# Patient Record
Sex: Female | Born: 1960
Health system: Southern US, Community
[De-identification: ages and names within clinical notes are randomized; demographics above are authoritative.]

## PROBLEM LIST (undated history)

## (undated) DIAGNOSIS — IMO0001 Reserved for inherently not codable concepts without codable children: Secondary | ICD-10-CM

## (undated) DIAGNOSIS — I509 Heart failure, unspecified: Secondary | ICD-10-CM

## (undated) DIAGNOSIS — C801 Malignant (primary) neoplasm, unspecified: Secondary | ICD-10-CM

## (undated) DIAGNOSIS — Z9221 Personal history of antineoplastic chemotherapy: Secondary | ICD-10-CM

## (undated) DIAGNOSIS — R002 Palpitations: Secondary | ICD-10-CM

## (undated) DIAGNOSIS — A630 Anogenital (venereal) warts: Secondary | ICD-10-CM

## (undated) DIAGNOSIS — R112 Nausea with vomiting, unspecified: Secondary | ICD-10-CM

## (undated) DIAGNOSIS — R0600 Dyspnea, unspecified: Secondary | ICD-10-CM

## (undated) DIAGNOSIS — C50919 Malignant neoplasm of unspecified site of unspecified female breast: Secondary | ICD-10-CM

## (undated) DIAGNOSIS — Z923 Personal history of irradiation: Secondary | ICD-10-CM

## (undated) DIAGNOSIS — C210 Malignant neoplasm of anus, unspecified: Secondary | ICD-10-CM

## (undated) DIAGNOSIS — R911 Solitary pulmonary nodule: Secondary | ICD-10-CM

## (undated) DIAGNOSIS — F419 Anxiety disorder, unspecified: Secondary | ICD-10-CM

## (undated) DIAGNOSIS — M1990 Unspecified osteoarthritis, unspecified site: Secondary | ICD-10-CM

## (undated) DIAGNOSIS — J189 Pneumonia, unspecified organism: Secondary | ICD-10-CM

## (undated) DIAGNOSIS — J449 Chronic obstructive pulmonary disease, unspecified: Secondary | ICD-10-CM

## (undated) DIAGNOSIS — B029 Zoster without complications: Secondary | ICD-10-CM

## (undated) DIAGNOSIS — Z8614 Personal history of Methicillin resistant Staphylococcus aureus infection: Secondary | ICD-10-CM

## (undated) DIAGNOSIS — Z9889 Other specified postprocedural states: Secondary | ICD-10-CM

## (undated) DIAGNOSIS — Z8619 Personal history of other infectious and parasitic diseases: Secondary | ICD-10-CM

## (undated) DIAGNOSIS — D649 Anemia, unspecified: Secondary | ICD-10-CM

## (undated) DIAGNOSIS — C3412 Malignant neoplasm of upper lobe, left bronchus or lung: Secondary | ICD-10-CM

## (undated) DIAGNOSIS — IMO0002 Reserved for concepts with insufficient information to code with codable children: Secondary | ICD-10-CM

## (undated) HISTORY — DX: Malignant neoplasm of unspecified site of unspecified female breast: C50.919

## (undated) HISTORY — DX: Personal history of irradiation: Z92.3

## (undated) HISTORY — DX: Personal history of antineoplastic chemotherapy: Z92.21

## (undated) HISTORY — PX: LUNG LOBECTOMY: SHX167

## (undated) HISTORY — DX: Malignant (primary) neoplasm, unspecified: C80.1

## (undated) HISTORY — DX: Zoster without complications: B02.9

## (undated) HISTORY — DX: Personal history of other infectious and parasitic diseases: Z86.19

## (undated) HISTORY — DX: Anogenital (venereal) warts: A63.0

## (undated) HISTORY — DX: Reserved for inherently not codable concepts without codable children: IMO0001

## (undated) HISTORY — PX: COLONOSCOPY W/ POLYPECTOMY: SHX1380

## (undated) HISTORY — DX: Personal history of Methicillin resistant Staphylococcus aureus infection: Z86.14

## (undated) HISTORY — DX: Malignant neoplasm of anus, unspecified: C21.0

## (undated) HISTORY — DX: Reserved for concepts with insufficient information to code with codable children: IMO0002

## (undated) HISTORY — PX: KNEE ARTHROSCOPY: SUR90

## (undated) HISTORY — DX: Malignant neoplasm of upper lobe, left bronchus or lung: C34.12

---
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
## 2003-10-27 ENCOUNTER — Encounter: Admission: RE | Admit: 2003-10-27 | Discharge: 2003-10-27 | Payer: Self-pay | Admitting: Family Medicine

## 2004-03-15 ENCOUNTER — Other Ambulatory Visit: Admission: RE | Admit: 2004-03-15 | Discharge: 2004-03-15 | Payer: Self-pay | Admitting: Family Medicine

## 2004-08-24 ENCOUNTER — Other Ambulatory Visit: Admission: RE | Admit: 2004-08-24 | Discharge: 2004-08-24 | Payer: Self-pay | Admitting: Family Medicine

## 2005-03-07 ENCOUNTER — Other Ambulatory Visit: Admission: RE | Admit: 2005-03-07 | Discharge: 2005-03-07 | Payer: Self-pay | Admitting: Family Medicine

## 2005-04-06 ENCOUNTER — Encounter: Admission: RE | Admit: 2005-04-06 | Discharge: 2005-04-06 | Payer: Self-pay | Admitting: Family Medicine

## 2006-04-20 ENCOUNTER — Ambulatory Visit (HOSPITAL_COMMUNITY): Admission: RE | Admit: 2006-04-20 | Discharge: 2006-04-20 | Payer: Self-pay | Admitting: Family Medicine

## 2006-04-23 ENCOUNTER — Encounter: Admission: RE | Admit: 2006-04-23 | Discharge: 2006-04-23 | Payer: Self-pay | Admitting: Family Medicine

## 2006-05-26 ENCOUNTER — Emergency Department (HOSPITAL_COMMUNITY): Admission: EM | Admit: 2006-05-26 | Discharge: 2006-05-26 | Payer: Self-pay | Admitting: Emergency Medicine

## 2007-03-20 ENCOUNTER — Other Ambulatory Visit: Admission: RE | Admit: 2007-03-20 | Discharge: 2007-03-20 | Payer: Self-pay | Admitting: Obstetrics & Gynecology

## 2007-04-26 ENCOUNTER — Ambulatory Visit (HOSPITAL_COMMUNITY): Admission: RE | Admit: 2007-04-26 | Discharge: 2007-04-26 | Payer: Self-pay | Admitting: Obstetrics & Gynecology

## 2007-05-09 DIAGNOSIS — C50919 Malignant neoplasm of unspecified site of unspecified female breast: Secondary | ICD-10-CM

## 2007-05-09 HISTORY — DX: Malignant neoplasm of unspecified site of unspecified female breast: C50.919

## 2007-06-04 DIAGNOSIS — C50911 Malignant neoplasm of unspecified site of right female breast: Secondary | ICD-10-CM | POA: Insufficient documentation

## 2008-02-04 ENCOUNTER — Encounter: Admission: RE | Admit: 2008-02-04 | Discharge: 2008-02-04 | Payer: Self-pay | Admitting: Obstetrics & Gynecology

## 2008-02-04 ENCOUNTER — Encounter (INDEPENDENT_AMBULATORY_CARE_PROVIDER_SITE_OTHER): Payer: Self-pay | Admitting: Diagnostic Radiology

## 2008-02-04 HISTORY — PX: BREAST BIOPSY: SHX20

## 2008-02-05 ENCOUNTER — Encounter: Admission: RE | Admit: 2008-02-05 | Discharge: 2008-02-05 | Payer: Self-pay | Admitting: Obstetrics & Gynecology

## 2008-02-06 DIAGNOSIS — C801 Malignant (primary) neoplasm, unspecified: Secondary | ICD-10-CM

## 2008-02-06 DIAGNOSIS — Z9221 Personal history of antineoplastic chemotherapy: Secondary | ICD-10-CM

## 2008-02-06 HISTORY — DX: Malignant (primary) neoplasm, unspecified: C80.1

## 2008-02-06 HISTORY — DX: Personal history of antineoplastic chemotherapy: Z92.21

## 2008-02-11 ENCOUNTER — Encounter: Admission: RE | Admit: 2008-02-11 | Discharge: 2008-02-11 | Payer: Self-pay | Admitting: Obstetrics & Gynecology

## 2008-02-12 ENCOUNTER — Encounter: Admission: RE | Admit: 2008-02-12 | Discharge: 2008-02-12 | Payer: Self-pay | Admitting: Obstetrics & Gynecology

## 2008-02-13 ENCOUNTER — Ambulatory Visit: Payer: Self-pay | Admitting: Oncology

## 2008-02-19 LAB — CBC WITH DIFFERENTIAL/PLATELET
BASO%: 0.7 % (ref 0.0–2.0)
Basophils Absolute: 0 10*3/uL (ref 0.0–0.1)
EOS%: 4.7 % (ref 0.0–7.0)
Eosinophils Absolute: 0.3 10*3/uL (ref 0.0–0.5)
HCT: 42.8 % (ref 34.8–46.6)
HGB: 14.9 g/dL (ref 11.6–15.9)
LYMPH%: 28.5 % (ref 14.0–48.0)
MCH: 33.4 pg (ref 26.0–34.0)
MCHC: 34.8 g/dL (ref 32.0–36.0)
MCV: 95.8 fL (ref 81.0–101.0)
MONO#: 0.4 10*3/uL (ref 0.1–0.9)
MONO%: 6.5 % (ref 0.0–13.0)
NEUT#: 3.6 10*3/uL (ref 1.5–6.5)
NEUT%: 59.6 % (ref 39.6–76.8)
Platelets: 286 10*3/uL (ref 145–400)
RBC: 4.47 10*6/uL (ref 3.70–5.32)
RDW: 12.5 % (ref 11.3–14.5)
WBC: 6.1 10*3/uL (ref 3.9–10.0)
lymph#: 1.7 10*3/uL (ref 0.9–3.3)

## 2008-02-19 LAB — COMPREHENSIVE METABOLIC PANEL
ALT: 15 U/L (ref 0–35)
AST: 15 U/L (ref 0–37)
Albumin: 4.7 g/dL (ref 3.5–5.2)
Alkaline Phosphatase: 54 U/L (ref 39–117)
BUN: 11 mg/dL (ref 6–23)
CO2: 26 mEq/L (ref 19–32)
Calcium: 10 mg/dL (ref 8.4–10.5)
Chloride: 103 mEq/L (ref 96–112)
Creatinine, Ser: 0.76 mg/dL (ref 0.40–1.20)
Glucose, Bld: 103 mg/dL — ABNORMAL HIGH (ref 70–99)
Potassium: 4.3 mEq/L (ref 3.5–5.3)
Sodium: 139 mEq/L (ref 135–145)
Total Bilirubin: 0.7 mg/dL (ref 0.3–1.2)
Total Protein: 7.4 g/dL (ref 6.0–8.3)

## 2008-02-19 LAB — CANCER ANTIGEN 27.29: CA 27.29: 14 U/mL (ref 0–39)

## 2008-02-19 LAB — VITAMIN D 25 HYDROXY (VIT D DEFICIENCY, FRACTURES): Vit D, 25-Hydroxy: 34 ng/mL (ref 30–89)

## 2008-02-19 LAB — LACTATE DEHYDROGENASE: LDH: 140 U/L (ref 94–250)

## 2008-02-21 ENCOUNTER — Ambulatory Visit (HOSPITAL_COMMUNITY): Admission: RE | Admit: 2008-02-21 | Discharge: 2008-02-21 | Payer: Self-pay | Admitting: General Surgery

## 2008-02-24 ENCOUNTER — Ambulatory Visit (HOSPITAL_COMMUNITY): Admission: RE | Admit: 2008-02-24 | Discharge: 2008-02-24 | Payer: Self-pay | Admitting: Oncology

## 2008-02-25 ENCOUNTER — Ambulatory Visit: Payer: Self-pay

## 2008-02-25 ENCOUNTER — Encounter: Payer: Self-pay | Admitting: Oncology

## 2008-03-08 HISTORY — PX: PORTACATH PLACEMENT: SHX2246

## 2008-03-11 LAB — CBC WITH DIFFERENTIAL/PLATELET
BASO%: 0.5 % (ref 0.0–2.0)
Basophils Absolute: 0 10*3/uL (ref 0.0–0.1)
EOS%: 4.5 % (ref 0.0–7.0)
Eosinophils Absolute: 0.3 10*3/uL (ref 0.0–0.5)
HCT: 37.8 % (ref 34.8–46.6)
HGB: 13.3 g/dL (ref 11.6–15.9)
LYMPH%: 25.1 % (ref 14.0–48.0)
MCH: 33.8 pg (ref 26.0–34.0)
MCHC: 35.2 g/dL (ref 32.0–36.0)
MCV: 96.2 fL (ref 81.0–101.0)
MONO#: 0.3 10*3/uL (ref 0.1–0.9)
MONO%: 4 % (ref 0.0–13.0)
NEUT#: 5 10*3/uL (ref 1.5–6.5)
NEUT%: 65.9 % (ref 39.6–76.8)
Platelets: 256 10*3/uL (ref 145–400)
RBC: 3.93 10*6/uL (ref 3.70–5.32)
RDW: 12.8 % (ref 11.3–14.5)
WBC: 7.6 10*3/uL (ref 3.9–10.0)
lymph#: 1.9 10*3/uL (ref 0.9–3.3)

## 2008-03-23 LAB — CBC WITH DIFFERENTIAL/PLATELET
BASO%: 1.6 % (ref 0.0–2.0)
Basophils Absolute: 0.2 10*3/uL — ABNORMAL HIGH (ref 0.0–0.1)
EOS%: 1.8 % (ref 0.0–7.0)
Eosinophils Absolute: 0.2 10*3/uL (ref 0.0–0.5)
HCT: 38.7 % (ref 34.8–46.6)
HGB: 13.5 g/dL (ref 11.6–15.9)
LYMPH%: 20.4 % (ref 14.0–48.0)
MCH: 32.7 pg (ref 26.0–34.0)
MCHC: 34.9 g/dL (ref 32.0–36.0)
MCV: 94 fL (ref 81.0–101.0)
MONO#: 0.7 10*3/uL (ref 0.1–0.9)
MONO%: 7.7 % (ref 0.0–13.0)
NEUT#: 6.6 10*3/uL — ABNORMAL HIGH (ref 1.5–6.5)
NEUT%: 68.5 % (ref 39.6–76.8)
Platelets: 257 10*3/uL (ref 145–400)
RBC: 4.11 10*6/uL (ref 3.70–5.32)
RDW: 12.8 % (ref 11.3–14.5)
WBC: 9.6 10*3/uL (ref 3.9–10.0)
lymph#: 2 10*3/uL (ref 0.9–3.3)

## 2008-03-30 ENCOUNTER — Ambulatory Visit: Payer: Self-pay | Admitting: Oncology

## 2008-04-01 LAB — CBC WITH DIFFERENTIAL/PLATELET
BASO%: 0.6 % (ref 0.0–2.0)
Basophils Absolute: 0 10*3/uL (ref 0.0–0.1)
EOS%: 3.9 % (ref 0.0–7.0)
Eosinophils Absolute: 0.3 10*3/uL (ref 0.0–0.5)
HCT: 38 % (ref 34.8–46.6)
HGB: 13.3 g/dL (ref 11.6–15.9)
LYMPH%: 22.5 % (ref 14.0–48.0)
MCH: 33.7 pg (ref 26.0–34.0)
MCHC: 34.9 g/dL (ref 32.0–36.0)
MCV: 96.5 fL (ref 81.0–101.0)
MONO#: 0.5 10*3/uL (ref 0.1–0.9)
MONO%: 6.7 % (ref 0.0–13.0)
NEUT#: 4.8 10*3/uL (ref 1.5–6.5)
NEUT%: 66.3 % (ref 39.6–76.8)
Platelets: 277 10*3/uL (ref 145–400)
RBC: 3.93 10*6/uL (ref 3.70–5.32)
RDW: 13.9 % (ref 11.3–14.5)
WBC: 7.3 10*3/uL (ref 3.9–10.0)
lymph#: 1.6 10*3/uL (ref 0.9–3.3)

## 2008-04-13 LAB — COMPREHENSIVE METABOLIC PANEL
ALT: 14 U/L (ref 0–35)
AST: 15 U/L (ref 0–37)
Albumin: 4.2 g/dL (ref 3.5–5.2)
Alkaline Phosphatase: 60 U/L (ref 39–117)
BUN: 10 mg/dL (ref 6–23)
CO2: 24 mEq/L (ref 19–32)
Calcium: 9 mg/dL (ref 8.4–10.5)
Chloride: 104 mEq/L (ref 96–112)
Creatinine, Ser: 0.66 mg/dL (ref 0.40–1.20)
Glucose, Bld: 107 mg/dL — ABNORMAL HIGH (ref 70–99)
Potassium: 4.4 mEq/L (ref 3.5–5.3)
Sodium: 140 mEq/L (ref 135–145)
Total Bilirubin: 0.4 mg/dL (ref 0.3–1.2)
Total Protein: 6.9 g/dL (ref 6.0–8.3)

## 2008-04-13 LAB — CBC WITH DIFFERENTIAL/PLATELET
BASO%: 0.8 % (ref 0.0–2.0)
Basophils Absolute: 0.1 10*3/uL (ref 0.0–0.1)
EOS%: 2.7 % (ref 0.0–7.0)
Eosinophils Absolute: 0.2 10*3/uL (ref 0.0–0.5)
HCT: 36 % (ref 34.8–46.6)
HGB: 12.6 g/dL (ref 11.6–15.9)
LYMPH%: 23 % (ref 14.0–48.0)
MCH: 34 pg (ref 26.0–34.0)
MCHC: 35.1 g/dL (ref 32.0–36.0)
MCV: 96.7 fL (ref 81.0–101.0)
MONO#: 0.5 10*3/uL (ref 0.1–0.9)
MONO%: 7.6 % (ref 0.0–13.0)
NEUT#: 4.4 10*3/uL (ref 1.5–6.5)
NEUT%: 65.9 % (ref 39.6–76.8)
Platelets: 289 10*3/uL (ref 145–400)
RBC: 3.72 10*6/uL (ref 3.70–5.32)
RDW: 14.7 % — ABNORMAL HIGH (ref 11.3–14.5)
WBC: 6.7 10*3/uL (ref 3.9–10.0)
lymph#: 1.5 10*3/uL (ref 0.9–3.3)

## 2008-04-28 LAB — CBC WITH DIFFERENTIAL/PLATELET
BASO%: 0.4 % (ref 0.0–2.0)
Basophils Absolute: 0 10*3/uL (ref 0.0–0.1)
EOS%: 2.2 % (ref 0.0–7.0)
Eosinophils Absolute: 0.2 10*3/uL (ref 0.0–0.5)
HCT: 35.7 % (ref 34.8–46.6)
HGB: 12.4 g/dL (ref 11.6–15.9)
LYMPH%: 19.1 % (ref 14.0–48.0)
MCH: 34.2 pg — ABNORMAL HIGH (ref 26.0–34.0)
MCHC: 34.8 g/dL (ref 32.0–36.0)
MCV: 98.3 fL (ref 81.0–101.0)
MONO#: 0.7 10*3/uL (ref 0.1–0.9)
MONO%: 7.2 % (ref 0.0–13.0)
NEUT#: 6.5 10*3/uL (ref 1.5–6.5)
NEUT%: 71.1 % (ref 39.6–76.8)
Platelets: 231 10*3/uL (ref 145–400)
RBC: 3.63 10*6/uL — ABNORMAL LOW (ref 3.70–5.32)
RDW: 15.5 % — ABNORMAL HIGH (ref 11.3–14.5)
WBC: 9.1 10*3/uL (ref 3.9–10.0)
lymph#: 1.7 10*3/uL (ref 0.9–3.3)

## 2008-05-05 LAB — CBC WITH DIFFERENTIAL/PLATELET
BASO%: 0.3 % (ref 0.0–2.0)
Basophils Absolute: 0 10*3/uL (ref 0.0–0.1)
EOS%: 1.7 % (ref 0.0–7.0)
Eosinophils Absolute: 0.1 10*3/uL (ref 0.0–0.5)
HCT: 39.8 % (ref 34.8–46.6)
HGB: 13.8 g/dL (ref 11.6–15.9)
LYMPH%: 19.5 % (ref 14.0–48.0)
MCH: 34.1 pg — ABNORMAL HIGH (ref 26.0–34.0)
MCHC: 34.7 g/dL (ref 32.0–36.0)
MCV: 98.5 fL (ref 81.0–101.0)
MONO#: 0.6 10*3/uL (ref 0.1–0.9)
MONO%: 7.9 % (ref 0.0–13.0)
NEUT#: 5.7 10*3/uL (ref 1.5–6.5)
NEUT%: 70.6 % (ref 39.6–76.8)
Platelets: 302 10*3/uL (ref 145–400)
RBC: 4.05 10*6/uL (ref 3.70–5.32)
RDW: 15.8 % — ABNORMAL HIGH (ref 11.3–14.5)
WBC: 8.1 10*3/uL (ref 3.9–10.0)
lymph#: 1.6 10*3/uL (ref 0.9–3.3)

## 2008-05-08 DIAGNOSIS — Z923 Personal history of irradiation: Secondary | ICD-10-CM

## 2008-05-08 HISTORY — PX: BREAST LUMPECTOMY: SHX2

## 2008-05-08 HISTORY — DX: Personal history of irradiation: Z92.3

## 2008-05-11 ENCOUNTER — Ambulatory Visit: Payer: Self-pay | Admitting: Oncology

## 2008-05-13 LAB — CBC WITH DIFFERENTIAL/PLATELET
BASO%: 0.6 % (ref 0.0–2.0)
Basophils Absolute: 0 10*3/uL (ref 0.0–0.1)
EOS%: 3.6 % (ref 0.0–7.0)
Eosinophils Absolute: 0.2 10*3/uL (ref 0.0–0.5)
HCT: 34.9 % (ref 34.8–46.6)
HGB: 12.2 g/dL (ref 11.6–15.9)
LYMPH%: 21.9 % (ref 14.0–48.0)
MCH: 34.4 pg — ABNORMAL HIGH (ref 26.0–34.0)
MCHC: 34.8 g/dL (ref 32.0–36.0)
MCV: 98.9 fL (ref 81.0–101.0)
MONO#: 0.4 10*3/uL (ref 0.1–0.9)
MONO%: 6.5 % (ref 0.0–13.0)
NEUT#: 4.3 10*3/uL (ref 1.5–6.5)
NEUT%: 67.4 % (ref 39.6–76.8)
Platelets: 285 10*3/uL (ref 145–400)
RBC: 3.53 10*6/uL — ABNORMAL LOW (ref 3.70–5.32)
RDW: 14.8 % — ABNORMAL HIGH (ref 11.3–14.5)
WBC: 6.3 10*3/uL (ref 3.9–10.0)
lymph#: 1.4 10*3/uL (ref 0.9–3.3)

## 2008-05-20 ENCOUNTER — Ambulatory Visit (HOSPITAL_COMMUNITY): Admission: RE | Admit: 2008-05-20 | Discharge: 2008-05-20 | Payer: Self-pay | Admitting: Oncology

## 2008-05-20 ENCOUNTER — Ambulatory Visit: Payer: Self-pay | Admitting: Cardiology

## 2008-05-20 ENCOUNTER — Encounter: Payer: Self-pay | Admitting: Oncology

## 2008-06-09 LAB — CBC WITH DIFFERENTIAL/PLATELET
BASO%: 0.8 % (ref 0.0–2.0)
Basophils Absolute: 0 10*3/uL (ref 0.0–0.1)
EOS%: 4.1 % (ref 0.0–7.0)
Eosinophils Absolute: 0.2 10*3/uL (ref 0.0–0.5)
HCT: 37 % (ref 34.8–46.6)
HGB: 12.9 g/dL (ref 11.6–15.9)
LYMPH%: 23.4 % (ref 14.0–48.0)
MCH: 35 pg — ABNORMAL HIGH (ref 26.0–34.0)
MCHC: 34.8 g/dL (ref 32.0–36.0)
MCV: 100.8 fL (ref 81.0–101.0)
MONO#: 0.4 10*3/uL (ref 0.1–0.9)
MONO%: 7.9 % (ref 0.0–13.0)
NEUT#: 3 10*3/uL (ref 1.5–6.5)
NEUT%: 63.8 % (ref 39.6–76.8)
Platelets: 299 10*3/uL (ref 145–400)
RBC: 3.67 10*6/uL — ABNORMAL LOW (ref 3.70–5.32)
RDW: 15 % — ABNORMAL HIGH (ref 11.3–14.5)
WBC: 4.7 10*3/uL (ref 3.9–10.0)
lymph#: 1.1 10*3/uL (ref 0.9–3.3)

## 2008-06-23 LAB — CBC WITH DIFFERENTIAL/PLATELET
BASO%: 0.6 % (ref 0.0–2.0)
Basophils Absolute: 0 10*3/uL (ref 0.0–0.1)
EOS%: 4.9 % (ref 0.0–7.0)
Eosinophils Absolute: 0.3 10*3/uL (ref 0.0–0.5)
HCT: 39.4 % (ref 34.8–46.6)
HGB: 13.6 g/dL (ref 11.6–15.9)
LYMPH%: 21.4 % (ref 14.0–48.0)
MCH: 35.4 pg — ABNORMAL HIGH (ref 26.0–34.0)
MCHC: 34.6 g/dL (ref 32.0–36.0)
MCV: 102.2 fL — ABNORMAL HIGH (ref 81.0–101.0)
MONO#: 0.6 10*3/uL (ref 0.1–0.9)
MONO%: 10.1 % (ref 0.0–13.0)
NEUT#: 3.5 10*3/uL (ref 1.5–6.5)
NEUT%: 63 % (ref 39.6–76.8)
Platelets: 268 10*3/uL (ref 145–400)
RBC: 3.86 10*6/uL (ref 3.70–5.32)
RDW: 14.9 % — ABNORMAL HIGH (ref 11.3–14.5)
WBC: 5.6 10*3/uL (ref 3.9–10.0)
lymph#: 1.2 10*3/uL (ref 0.9–3.3)

## 2008-06-23 LAB — COMPREHENSIVE METABOLIC PANEL
ALT: 33 U/L (ref 0–35)
AST: 25 U/L (ref 0–37)
Albumin: 3.9 g/dL (ref 3.5–5.2)
Alkaline Phosphatase: 44 U/L (ref 39–117)
BUN: 9 mg/dL (ref 6–23)
CO2: 31 mEq/L (ref 19–32)
Calcium: 9.1 mg/dL (ref 8.4–10.5)
Chloride: 101 mEq/L (ref 96–112)
Creatinine, Ser: 0.69 mg/dL (ref 0.40–1.20)
Glucose, Bld: 134 mg/dL — ABNORMAL HIGH (ref 70–99)
Potassium: 3.8 mEq/L (ref 3.5–5.3)
Sodium: 138 mEq/L (ref 135–145)
Total Bilirubin: 0.8 mg/dL (ref 0.3–1.2)
Total Protein: 6.8 g/dL (ref 6.0–8.3)

## 2008-06-26 ENCOUNTER — Ambulatory Visit: Payer: Self-pay | Admitting: Oncology

## 2008-06-30 LAB — CBC WITH DIFFERENTIAL/PLATELET
BASO%: 0.4 % (ref 0.0–2.0)
Basophils Absolute: 0 10*3/uL (ref 0.0–0.1)
EOS%: 4.3 % (ref 0.0–7.0)
Eosinophils Absolute: 0.2 10*3/uL (ref 0.0–0.5)
HCT: 40 % (ref 34.8–46.6)
HGB: 13.8 g/dL (ref 11.6–15.9)
LYMPH%: 25.7 % (ref 14.0–49.7)
MCH: 35.1 pg — ABNORMAL HIGH (ref 25.1–34.0)
MCHC: 34.4 g/dL (ref 31.5–36.0)
MCV: 102.1 fL — ABNORMAL HIGH (ref 79.5–101.0)
MONO#: 0.4 10*3/uL (ref 0.1–0.9)
MONO%: 6.9 % (ref 0.0–14.0)
NEUT#: 3.6 10*3/uL (ref 1.5–6.5)
NEUT%: 62.7 % (ref 38.4–76.8)
Platelets: 300 10*3/uL (ref 145–400)
RBC: 3.92 10*6/uL (ref 3.70–5.45)
RDW: 14 % (ref 11.2–14.5)
WBC: 5.8 10*3/uL (ref 3.9–10.3)
lymph#: 1.5 10*3/uL (ref 0.9–3.3)

## 2008-07-07 LAB — CBC WITH DIFFERENTIAL/PLATELET
BASO%: 1.2 % (ref 0.0–2.0)
Basophils Absolute: 0.1 10*3/uL (ref 0.0–0.1)
EOS%: 4.5 % (ref 0.0–7.0)
Eosinophils Absolute: 0.2 10*3/uL (ref 0.0–0.5)
HCT: 39.1 % (ref 34.8–46.6)
HGB: 13.5 g/dL (ref 11.6–15.9)
LYMPH%: 23.5 % (ref 14.0–49.7)
MCH: 34.4 pg — ABNORMAL HIGH (ref 25.1–34.0)
MCHC: 34.5 g/dL (ref 31.5–36.0)
MCV: 99.5 fL (ref 79.5–101.0)
MONO#: 0.4 10*3/uL (ref 0.1–0.9)
MONO%: 7.7 % (ref 0.0–14.0)
NEUT#: 3.2 10*3/uL (ref 1.5–6.5)
NEUT%: 63.1 % (ref 38.4–76.8)
Platelets: ADEQUATE 10*3/uL (ref 145–400)
RBC: 3.93 10*6/uL (ref 3.70–5.45)
RDW: 13.3 % (ref 11.2–14.5)
WBC: 5.1 10*3/uL (ref 3.9–10.3)
lymph#: 1.2 10*3/uL (ref 0.9–3.3)
nRBC: 0 % (ref 0–0)

## 2008-07-21 LAB — CBC WITH DIFFERENTIAL/PLATELET
BASO%: 1.9 % (ref 0.0–2.0)
Basophils Absolute: 0.1 10*3/uL (ref 0.0–0.1)
EOS%: 3.9 % (ref 0.0–7.0)
Eosinophils Absolute: 0.2 10*3/uL (ref 0.0–0.5)
HCT: 41.7 % (ref 34.8–46.6)
HGB: 14.4 g/dL (ref 11.6–15.9)
LYMPH%: 27.1 % (ref 14.0–49.7)
MCH: 35.2 pg — ABNORMAL HIGH (ref 25.1–34.0)
MCHC: 34.5 g/dL (ref 31.5–36.0)
MCV: 101.9 fL — ABNORMAL HIGH (ref 79.5–101.0)
MONO#: 0.4 10*3/uL (ref 0.1–0.9)
MONO%: 8.7 % (ref 0.0–14.0)
NEUT#: 2.5 10*3/uL (ref 1.5–6.5)
NEUT%: 58.4 % (ref 38.4–76.8)
Platelets: 276 10*3/uL (ref 145–400)
RBC: 4.1 10*6/uL (ref 3.70–5.45)
RDW: 14 % (ref 11.2–14.5)
WBC: 4.4 10*3/uL (ref 3.9–10.3)
lymph#: 1.2 10*3/uL (ref 0.9–3.3)

## 2008-07-21 LAB — COMPREHENSIVE METABOLIC PANEL
ALT: 30 U/L (ref 0–35)
AST: 27 U/L (ref 0–37)
Albumin: 4 g/dL (ref 3.5–5.2)
Alkaline Phosphatase: 44 U/L (ref 39–117)
BUN: 10 mg/dL (ref 6–23)
CO2: 30 mEq/L (ref 19–32)
Calcium: 9.4 mg/dL (ref 8.4–10.5)
Chloride: 101 mEq/L (ref 96–112)
Creatinine, Ser: 0.67 mg/dL (ref 0.40–1.20)
Glucose, Bld: 111 mg/dL — ABNORMAL HIGH (ref 70–99)
Potassium: 4.2 mEq/L (ref 3.5–5.3)
Sodium: 139 mEq/L (ref 135–145)
Total Bilirubin: 1 mg/dL (ref 0.3–1.2)
Total Protein: 7.2 g/dL (ref 6.0–8.3)

## 2008-07-28 LAB — CBC WITH DIFFERENTIAL/PLATELET
BASO%: 0.4 % (ref 0.0–2.0)
Basophils Absolute: 0 10*3/uL (ref 0.0–0.1)
EOS%: 2.5 % (ref 0.0–7.0)
Eosinophils Absolute: 0.1 10*3/uL (ref 0.0–0.5)
HCT: 39.5 % (ref 34.8–46.6)
HGB: 13.7 g/dL (ref 11.6–15.9)
LYMPH%: 46.1 % (ref 14.0–49.7)
MCH: 35.3 pg — ABNORMAL HIGH (ref 25.1–34.0)
MCHC: 34.7 g/dL (ref 31.5–36.0)
MCV: 101.7 fL — ABNORMAL HIGH (ref 79.5–101.0)
MONO#: 0.3 10*3/uL (ref 0.1–0.9)
MONO%: 4.8 % (ref 0.0–14.0)
NEUT#: 2.6 10*3/uL (ref 1.5–6.5)
NEUT%: 46.2 % (ref 38.4–76.8)
Platelets: 271 10*3/uL (ref 145–400)
RBC: 3.88 10*6/uL (ref 3.70–5.45)
RDW: 13.9 % (ref 11.2–14.5)
WBC: 5.7 10*3/uL (ref 3.9–10.3)
lymph#: 2.6 10*3/uL (ref 0.9–3.3)

## 2008-08-04 LAB — CBC WITH DIFFERENTIAL/PLATELET
BASO%: 0.6 % (ref 0.0–2.0)
Basophils Absolute: 0 10*3/uL (ref 0.0–0.1)
EOS%: 3.2 % (ref 0.0–7.0)
Eosinophils Absolute: 0.2 10*3/uL (ref 0.0–0.5)
HCT: 40.3 % (ref 34.8–46.6)
HGB: 14 g/dL (ref 11.6–15.9)
LYMPH%: 23.9 % (ref 14.0–49.7)
MCH: 34 pg (ref 25.1–34.0)
MCHC: 34.7 g/dL (ref 31.5–36.0)
MCV: 97.8 fL (ref 79.5–101.0)
MONO#: 0.5 10*3/uL (ref 0.1–0.9)
MONO%: 7.1 % (ref 0.0–14.0)
NEUT#: 4.1 10*3/uL (ref 1.5–6.5)
NEUT%: 65.2 % (ref 38.4–76.8)
Platelets: 268 10*3/uL (ref 145–400)
RBC: 4.12 10*6/uL (ref 3.70–5.45)
RDW: 13.2 % (ref 11.2–14.5)
WBC: 6.3 10*3/uL (ref 3.9–10.3)
lymph#: 1.5 10*3/uL (ref 0.9–3.3)
nRBC: 0 % (ref 0–0)

## 2008-08-11 ENCOUNTER — Ambulatory Visit: Payer: Self-pay | Admitting: Oncology

## 2008-08-17 LAB — CBC WITH DIFFERENTIAL/PLATELET
BASO%: 0.7 % (ref 0.0–2.0)
Basophils Absolute: 0 10*3/uL (ref 0.0–0.1)
EOS%: 3.9 % (ref 0.0–7.0)
Eosinophils Absolute: 0.2 10*3/uL (ref 0.0–0.5)
HCT: 40.7 % (ref 34.8–46.6)
HGB: 14.3 g/dL (ref 11.6–15.9)
LYMPH%: 22 % (ref 14.0–49.7)
MCH: 35.1 pg — ABNORMAL HIGH (ref 25.1–34.0)
MCHC: 35.1 g/dL (ref 31.5–36.0)
MCV: 99.8 fL (ref 79.5–101.0)
MONO#: 0.5 10*3/uL (ref 0.1–0.9)
MONO%: 10.4 % (ref 0.0–14.0)
NEUT#: 3.1 10*3/uL (ref 1.5–6.5)
NEUT%: 63 % (ref 38.4–76.8)
Platelets: 289 10*3/uL (ref 145–400)
RBC: 4.07 10*6/uL (ref 3.70–5.45)
RDW: 13.9 % (ref 11.2–14.5)
WBC: 4.9 10*3/uL (ref 3.9–10.3)
lymph#: 1.1 10*3/uL (ref 0.9–3.3)

## 2008-08-17 LAB — COMPREHENSIVE METABOLIC PANEL
ALT: 43 U/L — ABNORMAL HIGH (ref 0–35)
AST: 30 U/L (ref 0–37)
Albumin: 3.9 g/dL (ref 3.5–5.2)
Alkaline Phosphatase: 53 U/L (ref 39–117)
BUN: 10 mg/dL (ref 6–23)
CO2: 29 mEq/L (ref 19–32)
Calcium: 9.3 mg/dL (ref 8.4–10.5)
Chloride: 102 mEq/L (ref 96–112)
Creatinine, Ser: 0.64 mg/dL (ref 0.40–1.20)
Glucose, Bld: 114 mg/dL — ABNORMAL HIGH (ref 70–99)
Potassium: 4 mEq/L (ref 3.5–5.3)
Sodium: 138 mEq/L (ref 135–145)
Total Bilirubin: 0.8 mg/dL (ref 0.3–1.2)
Total Protein: 7.3 g/dL (ref 6.0–8.3)

## 2008-08-25 LAB — CBC WITH DIFFERENTIAL/PLATELET
BASO%: 0.5 % (ref 0.0–2.0)
Basophils Absolute: 0 10*3/uL (ref 0.0–0.1)
EOS%: 4.1 % (ref 0.0–7.0)
Eosinophils Absolute: 0.3 10*3/uL (ref 0.0–0.5)
HCT: 39.2 % (ref 34.8–46.6)
HGB: 13.5 g/dL (ref 11.6–15.9)
LYMPH%: 26.5 % (ref 14.0–49.7)
MCH: 34.5 pg — ABNORMAL HIGH (ref 25.1–34.0)
MCHC: 34.4 g/dL (ref 31.5–36.0)
MCV: 100.3 fL (ref 79.5–101.0)
MONO#: 0.5 10*3/uL (ref 0.1–0.9)
MONO%: 7.4 % (ref 0.0–14.0)
NEUT#: 4.1 10*3/uL (ref 1.5–6.5)
NEUT%: 61.5 % (ref 38.4–76.8)
Platelets: 269 10*3/uL (ref 145–400)
RBC: 3.91 10*6/uL (ref 3.70–5.45)
RDW: 13.5 % (ref 11.2–14.5)
WBC: 6.7 10*3/uL (ref 3.9–10.3)
lymph#: 1.8 10*3/uL (ref 0.9–3.3)

## 2008-09-01 LAB — CBC WITH DIFFERENTIAL/PLATELET
BASO%: 0.7 % (ref 0.0–2.0)
Basophils Absolute: 0 10*3/uL (ref 0.0–0.1)
EOS%: 4.3 % (ref 0.0–7.0)
Eosinophils Absolute: 0.3 10*3/uL (ref 0.0–0.5)
HCT: 36.9 % (ref 34.8–46.6)
HGB: 13 g/dL (ref 11.6–15.9)
LYMPH%: 29 % (ref 14.0–49.7)
MCH: 33.8 pg (ref 25.1–34.0)
MCHC: 35.2 g/dL (ref 31.5–36.0)
MCV: 95.8 fL (ref 79.5–101.0)
MONO#: 0.4 10*3/uL (ref 0.1–0.9)
MONO%: 6.8 % (ref 0.0–14.0)
NEUT#: 3.4 10*3/uL (ref 1.5–6.5)
NEUT%: 59.2 % (ref 38.4–76.8)
Platelets: 282 10*3/uL (ref 145–400)
RBC: 3.85 10*6/uL (ref 3.70–5.45)
RDW: 13.5 % (ref 11.2–14.5)
WBC: 5.8 10*3/uL (ref 3.9–10.3)
lymph#: 1.7 10*3/uL (ref 0.9–3.3)

## 2008-09-05 HISTORY — PX: BREAST LUMPECTOMY WITH AXILLARY LYMPH NODE BIOPSY: SHX5593

## 2008-09-15 ENCOUNTER — Ambulatory Visit (HOSPITAL_COMMUNITY): Admission: RE | Admit: 2008-09-15 | Discharge: 2008-09-15 | Payer: Self-pay | Admitting: Oncology

## 2008-09-15 ENCOUNTER — Encounter: Payer: Self-pay | Admitting: Oncology

## 2008-09-15 ENCOUNTER — Ambulatory Visit: Payer: Self-pay

## 2008-09-16 LAB — CBC WITH DIFFERENTIAL/PLATELET
BASO%: 0.7 % (ref 0.0–2.0)
Basophils Absolute: 0 10*3/uL (ref 0.0–0.1)
EOS%: 3 % (ref 0.0–7.0)
Eosinophils Absolute: 0.2 10*3/uL (ref 0.0–0.5)
HCT: 40.1 % (ref 34.8–46.6)
HGB: 13.9 g/dL (ref 11.6–15.9)
LYMPH%: 20.1 % (ref 14.0–49.7)
MCH: 34.8 pg — ABNORMAL HIGH (ref 25.1–34.0)
MCHC: 34.8 g/dL (ref 31.5–36.0)
MCV: 100.2 fL (ref 79.5–101.0)
MONO#: 0.5 10*3/uL (ref 0.1–0.9)
MONO%: 7.9 % (ref 0.0–14.0)
NEUT#: 4.2 10*3/uL (ref 1.5–6.5)
NEUT%: 68.3 % (ref 38.4–76.8)
Platelets: 290 10*3/uL (ref 145–400)
RBC: 4.01 10*6/uL (ref 3.70–5.45)
RDW: 14.7 % — ABNORMAL HIGH (ref 11.2–14.5)
WBC: 6.2 10*3/uL (ref 3.9–10.3)
lymph#: 1.2 10*3/uL (ref 0.9–3.3)

## 2008-09-16 LAB — COMPREHENSIVE METABOLIC PANEL
ALT: 48 U/L — ABNORMAL HIGH (ref 0–35)
AST: 33 U/L (ref 0–37)
Albumin: 3.9 g/dL (ref 3.5–5.2)
Alkaline Phosphatase: 50 U/L (ref 39–117)
BUN: 14 mg/dL (ref 6–23)
CO2: 29 mEq/L (ref 19–32)
Calcium: 9.5 mg/dL (ref 8.4–10.5)
Chloride: 105 mEq/L (ref 96–112)
Creatinine, Ser: 0.7 mg/dL (ref 0.40–1.20)
Glucose, Bld: 109 mg/dL — ABNORMAL HIGH (ref 70–99)
Potassium: 4.3 mEq/L (ref 3.5–5.3)
Sodium: 140 mEq/L (ref 135–145)
Total Bilirubin: 0.5 mg/dL (ref 0.3–1.2)
Total Protein: 6.8 g/dL (ref 6.0–8.3)

## 2008-09-16 LAB — RESEARCH LABS

## 2008-09-21 ENCOUNTER — Encounter: Admission: RE | Admit: 2008-09-21 | Discharge: 2008-09-21 | Payer: Self-pay | Admitting: General Surgery

## 2008-09-22 ENCOUNTER — Encounter: Admission: RE | Admit: 2008-09-22 | Discharge: 2008-09-22 | Payer: Self-pay | Admitting: General Surgery

## 2008-09-22 ENCOUNTER — Encounter (INDEPENDENT_AMBULATORY_CARE_PROVIDER_SITE_OTHER): Payer: Self-pay | Admitting: General Surgery

## 2008-09-22 ENCOUNTER — Ambulatory Visit (HOSPITAL_BASED_OUTPATIENT_CLINIC_OR_DEPARTMENT_OTHER): Admission: RE | Admit: 2008-09-22 | Discharge: 2008-09-22 | Payer: Self-pay | Admitting: General Surgery

## 2008-09-28 ENCOUNTER — Ambulatory Visit: Admission: RE | Admit: 2008-09-28 | Discharge: 2008-12-23 | Payer: Self-pay | Admitting: Radiation Oncology

## 2008-10-01 ENCOUNTER — Ambulatory Visit: Payer: Self-pay | Admitting: Oncology

## 2008-10-06 LAB — CBC WITH DIFFERENTIAL/PLATELET
BASO%: 0.5 % (ref 0.0–2.0)
Basophils Absolute: 0 10*3/uL (ref 0.0–0.1)
EOS%: 5.6 % (ref 0.0–7.0)
Eosinophils Absolute: 0.4 10*3/uL (ref 0.0–0.5)
HCT: 38.9 % (ref 34.8–46.6)
HGB: 13.6 g/dL (ref 11.6–15.9)
LYMPH%: 20.4 % (ref 14.0–49.7)
MCH: 34.7 pg — ABNORMAL HIGH (ref 25.1–34.0)
MCHC: 35 g/dL (ref 31.5–36.0)
MCV: 99.2 fL (ref 79.5–101.0)
MONO#: 0.6 10*3/uL (ref 0.1–0.9)
MONO%: 8.4 % (ref 0.0–14.0)
NEUT#: 4.7 10*3/uL (ref 1.5–6.5)
NEUT%: 65.1 % (ref 38.4–76.8)
Platelets: 271 10*3/uL (ref 145–400)
RBC: 3.92 10*6/uL (ref 3.70–5.45)
RDW: 14.1 % (ref 11.2–14.5)
WBC: 7.2 10*3/uL (ref 3.9–10.3)
lymph#: 1.5 10*3/uL (ref 0.9–3.3)

## 2008-10-06 LAB — COMPREHENSIVE METABOLIC PANEL
ALT: 24 U/L (ref 0–35)
AST: 23 U/L (ref 0–37)
Albumin: 4 g/dL (ref 3.5–5.2)
Alkaline Phosphatase: 52 U/L (ref 39–117)
BUN: 5 mg/dL — ABNORMAL LOW (ref 6–23)
CO2: 29 mEq/L (ref 19–32)
Calcium: 9.4 mg/dL (ref 8.4–10.5)
Chloride: 104 mEq/L (ref 96–112)
Creatinine, Ser: 0.73 mg/dL (ref 0.40–1.20)
Glucose, Bld: 121 mg/dL — ABNORMAL HIGH (ref 70–99)
Potassium: 4 mEq/L (ref 3.5–5.3)
Sodium: 139 mEq/L (ref 135–145)
Total Bilirubin: 0.9 mg/dL (ref 0.3–1.2)
Total Protein: 7.1 g/dL (ref 6.0–8.3)

## 2008-10-27 LAB — CBC WITH DIFFERENTIAL/PLATELET
BASO%: 0.4 % (ref 0.0–2.0)
Basophils Absolute: 0 10*3/uL (ref 0.0–0.1)
EOS%: 4.1 % (ref 0.0–7.0)
Eosinophils Absolute: 0.3 10*3/uL (ref 0.0–0.5)
HCT: 40.8 % (ref 34.8–46.6)
HGB: 14.5 g/dL (ref 11.6–15.9)
LYMPH%: 19.1 % (ref 14.0–49.7)
MCH: 35 pg — ABNORMAL HIGH (ref 25.1–34.0)
MCHC: 35.5 g/dL (ref 31.5–36.0)
MCV: 98.7 fL (ref 79.5–101.0)
MONO#: 0.4 10*3/uL (ref 0.1–0.9)
MONO%: 7 % (ref 0.0–14.0)
NEUT#: 4.3 10*3/uL (ref 1.5–6.5)
NEUT%: 69.4 % (ref 38.4–76.8)
Platelets: 257 10*3/uL (ref 145–400)
RBC: 4.14 10*6/uL (ref 3.70–5.45)
RDW: 13.5 % (ref 11.2–14.5)
WBC: 6.3 10*3/uL (ref 3.9–10.3)
lymph#: 1.2 10*3/uL (ref 0.9–3.3)

## 2008-10-27 LAB — COMPREHENSIVE METABOLIC PANEL
ALT: 20 U/L (ref 0–35)
AST: 22 U/L (ref 0–37)
Albumin: 4.1 g/dL (ref 3.5–5.2)
Alkaline Phosphatase: 51 U/L (ref 39–117)
BUN: 10 mg/dL (ref 6–23)
CO2: 29 mEq/L (ref 19–32)
Calcium: 9.3 mg/dL (ref 8.4–10.5)
Chloride: 101 mEq/L (ref 96–112)
Creatinine, Ser: 0.79 mg/dL (ref 0.40–1.20)
Glucose, Bld: 107 mg/dL — ABNORMAL HIGH (ref 70–99)
Potassium: 4.5 mEq/L (ref 3.5–5.3)
Sodium: 138 mEq/L (ref 135–145)
Total Bilirubin: 0.9 mg/dL (ref 0.3–1.2)
Total Protein: 7.3 g/dL (ref 6.0–8.3)

## 2008-11-13 ENCOUNTER — Ambulatory Visit: Payer: Self-pay | Admitting: Oncology

## 2008-11-16 ENCOUNTER — Ambulatory Visit: Payer: Self-pay

## 2008-11-16 ENCOUNTER — Encounter: Payer: Self-pay | Admitting: Cardiovascular Disease

## 2008-11-16 ENCOUNTER — Encounter: Payer: Self-pay | Admitting: Oncology

## 2008-11-17 LAB — CBC WITH DIFFERENTIAL/PLATELET
BASO%: 0.6 % (ref 0.0–2.0)
Basophils Absolute: 0 10*3/uL (ref 0.0–0.1)
EOS%: 4.5 % (ref 0.0–7.0)
Eosinophils Absolute: 0.2 10*3/uL (ref 0.0–0.5)
HCT: 41.2 % (ref 34.8–46.6)
HGB: 14.4 g/dL (ref 11.6–15.9)
LYMPH%: 21.2 % (ref 14.0–49.7)
MCH: 34.3 pg — ABNORMAL HIGH (ref 25.1–34.0)
MCHC: 35 g/dL (ref 31.5–36.0)
MCV: 98.1 fL (ref 79.5–101.0)
MONO#: 0.4 10*3/uL (ref 0.1–0.9)
MONO%: 8.3 % (ref 0.0–14.0)
NEUT#: 3.4 10*3/uL (ref 1.5–6.5)
NEUT%: 65.4 % (ref 38.4–76.8)
Platelets: 253 10*3/uL (ref 145–400)
RBC: 4.2 10*6/uL (ref 3.70–5.45)
RDW: 13.1 % (ref 11.2–14.5)
WBC: 5.3 10*3/uL (ref 3.9–10.3)
lymph#: 1.1 10*3/uL (ref 0.9–3.3)

## 2008-11-17 LAB — COMPREHENSIVE METABOLIC PANEL
ALT: 23 U/L (ref 0–35)
AST: 22 U/L (ref 0–37)
Albumin: 3.9 g/dL (ref 3.5–5.2)
Alkaline Phosphatase: 51 U/L (ref 39–117)
BUN: 14 mg/dL (ref 6–23)
CO2: 30 mEq/L (ref 19–32)
Calcium: 9.5 mg/dL (ref 8.4–10.5)
Chloride: 102 mEq/L (ref 96–112)
Creatinine, Ser: 0.71 mg/dL (ref 0.40–1.20)
Glucose, Bld: 108 mg/dL — ABNORMAL HIGH (ref 70–99)
Potassium: 4.2 mEq/L (ref 3.5–5.3)
Sodium: 139 mEq/L (ref 135–145)
Total Bilirubin: 0.9 mg/dL (ref 0.3–1.2)
Total Protein: 7.1 g/dL (ref 6.0–8.3)

## 2008-12-08 LAB — CBC WITH DIFFERENTIAL/PLATELET
BASO%: 0.4 % (ref 0.0–2.0)
Basophils Absolute: 0 10*3/uL (ref 0.0–0.1)
EOS%: 4.5 % (ref 0.0–7.0)
Eosinophils Absolute: 0.3 10*3/uL (ref 0.0–0.5)
HCT: 42.3 % (ref 34.8–46.6)
HGB: 14.7 g/dL (ref 11.6–15.9)
LYMPH%: 19.7 % (ref 14.0–49.7)
MCH: 33.8 pg (ref 25.1–34.0)
MCHC: 34.7 g/dL (ref 31.5–36.0)
MCV: 97.4 fL (ref 79.5–101.0)
MONO#: 0.7 10*3/uL (ref 0.1–0.9)
MONO%: 10.4 % (ref 0.0–14.0)
NEUT#: 4.6 10*3/uL (ref 1.5–6.5)
NEUT%: 65 % (ref 38.4–76.8)
Platelets: 263 10*3/uL (ref 145–400)
RBC: 4.35 10*6/uL (ref 3.70–5.45)
RDW: 13 % (ref 11.2–14.5)
WBC: 7 10*3/uL (ref 3.9–10.3)
lymph#: 1.4 10*3/uL (ref 0.9–3.3)

## 2008-12-08 LAB — COMPREHENSIVE METABOLIC PANEL
ALT: 25 U/L (ref 0–35)
AST: 24 U/L (ref 0–37)
Albumin: 4.1 g/dL (ref 3.5–5.2)
Alkaline Phosphatase: 57 U/L (ref 39–117)
BUN: 13 mg/dL (ref 6–23)
CO2: 28 mEq/L (ref 19–32)
Calcium: 9.4 mg/dL (ref 8.4–10.5)
Chloride: 100 mEq/L (ref 96–112)
Creatinine, Ser: 0.71 mg/dL (ref 0.40–1.20)
Glucose, Bld: 111 mg/dL — ABNORMAL HIGH (ref 70–99)
Potassium: 4.4 mEq/L (ref 3.5–5.3)
Sodium: 137 mEq/L (ref 135–145)
Total Bilirubin: 1 mg/dL (ref 0.3–1.2)
Total Protein: 7.6 g/dL (ref 6.0–8.3)

## 2008-12-25 ENCOUNTER — Ambulatory Visit: Payer: Self-pay | Admitting: Oncology

## 2008-12-25 ENCOUNTER — Emergency Department (HOSPITAL_COMMUNITY): Admission: EM | Admit: 2008-12-25 | Discharge: 2008-12-25 | Payer: Self-pay | Admitting: Emergency Medicine

## 2008-12-29 LAB — COMPREHENSIVE METABOLIC PANEL
ALT: 33 U/L (ref 0–35)
AST: 27 U/L (ref 0–37)
Albumin: 4.1 g/dL (ref 3.5–5.2)
Alkaline Phosphatase: 62 U/L (ref 39–117)
BUN: 11 mg/dL (ref 6–23)
CO2: 30 mEq/L (ref 19–32)
Calcium: 9.4 mg/dL (ref 8.4–10.5)
Chloride: 98 mEq/L (ref 96–112)
Creatinine, Ser: 0.75 mg/dL (ref 0.40–1.20)
Glucose, Bld: 122 mg/dL — ABNORMAL HIGH (ref 70–99)
Potassium: 4.3 mEq/L (ref 3.5–5.3)
Sodium: 137 mEq/L (ref 135–145)
Total Bilirubin: 0.5 mg/dL (ref 0.3–1.2)
Total Protein: 7.5 g/dL (ref 6.0–8.3)

## 2008-12-29 LAB — CBC WITH DIFFERENTIAL/PLATELET
BASO%: 0.6 % (ref 0.0–2.0)
Basophils Absolute: 0 10*3/uL (ref 0.0–0.1)
EOS%: 3.4 % (ref 0.0–7.0)
Eosinophils Absolute: 0.2 10*3/uL (ref 0.0–0.5)
HCT: 41.5 % (ref 34.8–46.6)
HGB: 14.6 g/dL (ref 11.6–15.9)
LYMPH%: 19.1 % (ref 14.0–49.7)
MCH: 33.8 pg (ref 25.1–34.0)
MCHC: 35.2 g/dL (ref 31.5–36.0)
MCV: 96 fL (ref 79.5–101.0)
MONO#: 0.6 10*3/uL (ref 0.1–0.9)
MONO%: 10 % (ref 0.0–14.0)
NEUT#: 4.3 10*3/uL (ref 1.5–6.5)
NEUT%: 66.9 % (ref 38.4–76.8)
Platelets: 285 10*3/uL (ref 145–400)
RBC: 4.32 10*6/uL (ref 3.70–5.45)
RDW: 12.7 % (ref 11.2–14.5)
WBC: 6.4 10*3/uL (ref 3.9–10.3)
lymph#: 1.2 10*3/uL (ref 0.9–3.3)

## 2009-01-04 ENCOUNTER — Encounter: Admission: RE | Admit: 2009-01-04 | Discharge: 2009-01-04 | Payer: Self-pay | Admitting: Gastroenterology

## 2009-01-19 ENCOUNTER — Ambulatory Visit (HOSPITAL_COMMUNITY): Admission: RE | Admit: 2009-01-19 | Discharge: 2009-01-19 | Payer: Self-pay | Admitting: Oncology

## 2009-01-19 LAB — COMPREHENSIVE METABOLIC PANEL
ALT: 20 U/L (ref 0–35)
AST: 17 U/L (ref 0–37)
Albumin: 4.1 g/dL (ref 3.5–5.2)
Alkaline Phosphatase: 57 U/L (ref 39–117)
BUN: 12 mg/dL (ref 6–23)
CO2: 25 mEq/L (ref 19–32)
Calcium: 9.6 mg/dL (ref 8.4–10.5)
Chloride: 104 mEq/L (ref 96–112)
Creatinine, Ser: 0.79 mg/dL (ref 0.40–1.20)
Glucose, Bld: 107 mg/dL — ABNORMAL HIGH (ref 70–99)
Potassium: 4.4 mEq/L (ref 3.5–5.3)
Sodium: 138 mEq/L (ref 135–145)
Total Bilirubin: 0.7 mg/dL (ref 0.3–1.2)
Total Protein: 7.5 g/dL (ref 6.0–8.3)

## 2009-01-19 LAB — CBC WITH DIFFERENTIAL/PLATELET
BASO%: 0.8 % (ref 0.0–2.0)
Basophils Absolute: 0 10*3/uL (ref 0.0–0.1)
EOS%: 4.5 % (ref 0.0–7.0)
Eosinophils Absolute: 0.3 10*3/uL (ref 0.0–0.5)
HCT: 38.4 % (ref 34.8–46.6)
HGB: 13.4 g/dL (ref 11.6–15.9)
LYMPH%: 18.7 % (ref 14.0–49.7)
MCH: 33.4 pg (ref 25.1–34.0)
MCHC: 34.8 g/dL (ref 31.5–36.0)
MCV: 95.7 fL (ref 79.5–101.0)
MONO#: 0.4 10*3/uL (ref 0.1–0.9)
MONO%: 6.4 % (ref 0.0–14.0)
NEUT#: 4.3 10*3/uL (ref 1.5–6.5)
NEUT%: 69.6 % (ref 38.4–76.8)
Platelets: 308 10*3/uL (ref 145–400)
RBC: 4.01 10*6/uL (ref 3.70–5.45)
RDW: 13.2 % (ref 11.2–14.5)
WBC: 6.1 10*3/uL (ref 3.9–10.3)
lymph#: 1.1 10*3/uL (ref 0.9–3.3)

## 2009-01-22 ENCOUNTER — Ambulatory Visit (HOSPITAL_COMMUNITY): Admission: RE | Admit: 2009-01-22 | Discharge: 2009-01-22 | Payer: Self-pay | Admitting: Oncology

## 2009-01-25 ENCOUNTER — Inpatient Hospital Stay (HOSPITAL_COMMUNITY): Admission: EM | Admit: 2009-01-25 | Discharge: 2009-01-27 | Payer: Self-pay | Admitting: Emergency Medicine

## 2009-01-25 ENCOUNTER — Ambulatory Visit: Payer: Self-pay | Admitting: Oncology

## 2009-02-05 ENCOUNTER — Ambulatory Visit: Payer: Self-pay | Admitting: Oncology

## 2009-02-09 LAB — CBC WITH DIFFERENTIAL/PLATELET
BASO%: 1.1 % (ref 0.0–2.0)
Basophils Absolute: 0.1 10*3/uL (ref 0.0–0.1)
EOS%: 6 % (ref 0.0–7.0)
Eosinophils Absolute: 0.3 10*3/uL (ref 0.0–0.5)
HCT: 35.8 % (ref 34.8–46.6)
HGB: 12.4 g/dL (ref 11.6–15.9)
LYMPH%: 25.1 % (ref 14.0–49.7)
MCH: 33.7 pg (ref 25.1–34.0)
MCHC: 34.7 g/dL (ref 31.5–36.0)
MCV: 97.1 fL (ref 79.5–101.0)
MONO#: 0.4 10*3/uL (ref 0.1–0.9)
MONO%: 8.4 % (ref 0.0–14.0)
NEUT#: 2.9 10*3/uL (ref 1.5–6.5)
NEUT%: 59.4 % (ref 38.4–76.8)
Platelets: 270 10*3/uL (ref 145–400)
RBC: 3.69 10*6/uL — ABNORMAL LOW (ref 3.70–5.45)
RDW: 13.3 % (ref 11.2–14.5)
WBC: 4.9 10*3/uL (ref 3.9–10.3)
lymph#: 1.2 10*3/uL (ref 0.9–3.3)

## 2009-02-09 LAB — COMPREHENSIVE METABOLIC PANEL
ALT: 20 U/L (ref 0–35)
AST: 17 U/L (ref 0–37)
Albumin: 3.8 g/dL (ref 3.5–5.2)
Alkaline Phosphatase: 56 U/L (ref 39–117)
BUN: 9 mg/dL (ref 6–23)
CO2: 30 mEq/L (ref 19–32)
Calcium: 9.1 mg/dL (ref 8.4–10.5)
Chloride: 101 mEq/L (ref 96–112)
Creatinine, Ser: 0.7 mg/dL (ref 0.40–1.20)
Glucose, Bld: 117 mg/dL — ABNORMAL HIGH (ref 70–99)
Potassium: 4 mEq/L (ref 3.5–5.3)
Sodium: 137 mEq/L (ref 135–145)
Total Bilirubin: 0.8 mg/dL (ref 0.3–1.2)
Total Protein: 6.9 g/dL (ref 6.0–8.3)

## 2009-02-17 ENCOUNTER — Ambulatory Visit: Payer: Self-pay

## 2009-02-17 ENCOUNTER — Ambulatory Visit (HOSPITAL_COMMUNITY): Admission: RE | Admit: 2009-02-17 | Discharge: 2009-02-17 | Payer: Self-pay | Admitting: Cardiology

## 2009-02-17 ENCOUNTER — Ambulatory Visit: Payer: Self-pay | Admitting: Cardiology

## 2009-02-17 ENCOUNTER — Encounter: Payer: Self-pay | Admitting: Oncology

## 2009-03-02 LAB — COMPREHENSIVE METABOLIC PANEL
ALT: 14 U/L (ref 0–35)
AST: 18 U/L (ref 0–37)
Albumin: 3.7 g/dL (ref 3.5–5.2)
Alkaline Phosphatase: 49 U/L (ref 39–117)
BUN: 9 mg/dL (ref 6–23)
CO2: 31 mEq/L (ref 19–32)
Calcium: 8.9 mg/dL (ref 8.4–10.5)
Chloride: 103 mEq/L (ref 96–112)
Creatinine, Ser: 0.71 mg/dL (ref 0.40–1.20)
Glucose, Bld: 115 mg/dL — ABNORMAL HIGH (ref 70–99)
Potassium: 4.1 mEq/L (ref 3.5–5.3)
Sodium: 138 mEq/L (ref 135–145)
Total Bilirubin: 0.8 mg/dL (ref 0.3–1.2)
Total Protein: 7.2 g/dL (ref 6.0–8.3)

## 2009-03-02 LAB — CBC WITH DIFFERENTIAL/PLATELET
BASO%: 0.8 % (ref 0.0–2.0)
Basophils Absolute: 0 10*3/uL (ref 0.0–0.1)
EOS%: 5.8 % (ref 0.0–7.0)
Eosinophils Absolute: 0.3 10*3/uL (ref 0.0–0.5)
HCT: 36.9 % (ref 34.8–46.6)
HGB: 12.9 g/dL (ref 11.6–15.9)
LYMPH%: 29.1 % (ref 14.0–49.7)
MCH: 34.4 pg — ABNORMAL HIGH (ref 25.1–34.0)
MCHC: 35 g/dL (ref 31.5–36.0)
MCV: 98.1 fL (ref 79.5–101.0)
MONO#: 0.5 10*3/uL (ref 0.1–0.9)
MONO%: 8 % (ref 0.0–14.0)
NEUT#: 3.4 10*3/uL (ref 1.5–6.5)
NEUT%: 56.3 % (ref 38.4–76.8)
Platelets: 268 10*3/uL (ref 145–400)
RBC: 3.76 10*6/uL (ref 3.70–5.45)
RDW: 12.9 % (ref 11.2–14.5)
WBC: 6 10*3/uL (ref 3.9–10.3)
lymph#: 1.7 10*3/uL (ref 0.9–3.3)

## 2009-03-19 ENCOUNTER — Ambulatory Visit: Payer: Self-pay | Admitting: Oncology

## 2009-03-23 LAB — CBC WITH DIFFERENTIAL/PLATELET
BASO%: 0.8 % (ref 0.0–2.0)
Basophils Absolute: 0 10*3/uL (ref 0.0–0.1)
EOS%: 6.9 % (ref 0.0–7.0)
Eosinophils Absolute: 0.4 10*3/uL (ref 0.0–0.5)
HCT: 37.8 % (ref 34.8–46.6)
HGB: 13.2 g/dL (ref 11.6–15.9)
LYMPH%: 28.8 % (ref 14.0–49.7)
MCH: 34.3 pg — ABNORMAL HIGH (ref 25.1–34.0)
MCHC: 34.8 g/dL (ref 31.5–36.0)
MCV: 98.3 fL (ref 79.5–101.0)
MONO#: 0.4 10*3/uL (ref 0.1–0.9)
MONO%: 8.1 % (ref 0.0–14.0)
NEUT#: 2.9 10*3/uL (ref 1.5–6.5)
NEUT%: 55.4 % (ref 38.4–76.8)
Platelets: 267 10*3/uL (ref 145–400)
RBC: 3.84 10*6/uL (ref 3.70–5.45)
RDW: 13.1 % (ref 11.2–14.5)
WBC: 5.3 10*3/uL (ref 3.9–10.3)
lymph#: 1.5 10*3/uL (ref 0.9–3.3)

## 2009-03-23 LAB — COMPREHENSIVE METABOLIC PANEL
ALT: 17 U/L (ref 0–35)
AST: 21 U/L (ref 0–37)
Albumin: 3.8 g/dL (ref 3.5–5.2)
Alkaline Phosphatase: 46 U/L (ref 39–117)
BUN: 11 mg/dL (ref 6–23)
CO2: 30 mEq/L (ref 19–32)
Calcium: 9.1 mg/dL (ref 8.4–10.5)
Chloride: 103 mEq/L (ref 96–112)
Creatinine, Ser: 0.69 mg/dL (ref 0.40–1.20)
Glucose, Bld: 117 mg/dL — ABNORMAL HIGH (ref 70–99)
Potassium: 4.1 mEq/L (ref 3.5–5.3)
Sodium: 138 mEq/L (ref 135–145)
Total Bilirubin: 0.6 mg/dL (ref 0.3–1.2)
Total Protein: 7.2 g/dL (ref 6.0–8.3)

## 2009-04-13 LAB — CBC WITH DIFFERENTIAL/PLATELET
BASO%: 0.5 % (ref 0.0–2.0)
Basophils Absolute: 0 10*3/uL (ref 0.0–0.1)
EOS%: 5.7 % (ref 0.0–7.0)
Eosinophils Absolute: 0.4 10*3/uL (ref 0.0–0.5)
HCT: 38.7 % (ref 34.8–46.6)
HGB: 13.1 g/dL (ref 11.6–15.9)
LYMPH%: 26.6 % (ref 14.0–49.7)
MCH: 32.8 pg (ref 25.1–34.0)
MCHC: 33.9 g/dL (ref 31.5–36.0)
MCV: 97 fL (ref 79.5–101.0)
MONO#: 0.7 10*3/uL (ref 0.1–0.9)
MONO%: 8.7 % (ref 0.0–14.0)
NEUT#: 4.4 10*3/uL (ref 1.5–6.5)
NEUT%: 58.5 % (ref 38.4–76.8)
Platelets: 273 10*3/uL (ref 145–400)
RBC: 3.99 10*6/uL (ref 3.70–5.45)
RDW: 12.7 % (ref 11.2–14.5)
WBC: 7.6 10*3/uL (ref 3.9–10.3)
lymph#: 2 10*3/uL (ref 0.9–3.3)

## 2009-04-13 LAB — COMPREHENSIVE METABOLIC PANEL
ALT: 15 U/L (ref 0–35)
AST: 18 U/L (ref 0–37)
Albumin: 3.8 g/dL (ref 3.5–5.2)
Alkaline Phosphatase: 49 U/L (ref 39–117)
BUN: 13 mg/dL (ref 6–23)
CO2: 30 mEq/L (ref 19–32)
Calcium: 9 mg/dL (ref 8.4–10.5)
Chloride: 102 mEq/L (ref 96–112)
Creatinine, Ser: 0.73 mg/dL (ref 0.40–1.20)
Glucose, Bld: 114 mg/dL — ABNORMAL HIGH (ref 70–99)
Potassium: 4.1 mEq/L (ref 3.5–5.3)
Sodium: 139 mEq/L (ref 135–145)
Total Bilirubin: 0.6 mg/dL (ref 0.3–1.2)
Total Protein: 7.2 g/dL (ref 6.0–8.3)

## 2009-04-28 ENCOUNTER — Ambulatory Visit: Payer: Self-pay | Admitting: Oncology

## 2009-04-28 ENCOUNTER — Ambulatory Visit (HOSPITAL_COMMUNITY): Admission: RE | Admit: 2009-04-28 | Discharge: 2009-04-28 | Payer: Self-pay | Admitting: Oncology

## 2009-05-04 LAB — CBC WITH DIFFERENTIAL/PLATELET
BASO%: 0.9 % (ref 0.0–2.0)
Basophils Absolute: 0.1 10*3/uL (ref 0.0–0.1)
EOS%: 6.2 % (ref 0.0–7.0)
Eosinophils Absolute: 0.4 10*3/uL (ref 0.0–0.5)
HCT: 40.9 % (ref 34.8–46.6)
HGB: 13.7 g/dL (ref 11.6–15.9)
LYMPH%: 34.6 % (ref 14.0–49.7)
MCH: 32.6 pg (ref 25.1–34.0)
MCHC: 33.5 g/dL (ref 31.5–36.0)
MCV: 97.4 fL (ref 79.5–101.0)
MONO#: 0.5 10*3/uL (ref 0.1–0.9)
MONO%: 8.1 % (ref 0.0–14.0)
NEUT#: 3.2 10*3/uL (ref 1.5–6.5)
NEUT%: 50.2 % (ref 38.4–76.8)
Platelets: 275 10*3/uL (ref 145–400)
RBC: 4.2 10*6/uL (ref 3.70–5.45)
RDW: 13 % (ref 11.2–14.5)
WBC: 6.3 10*3/uL (ref 3.9–10.3)
lymph#: 2.2 10*3/uL (ref 0.9–3.3)

## 2009-05-04 LAB — COMPREHENSIVE METABOLIC PANEL
ALT: 18 U/L (ref 0–35)
AST: 21 U/L (ref 0–37)
Albumin: 3.9 g/dL (ref 3.5–5.2)
Alkaline Phosphatase: 49 U/L (ref 39–117)
BUN: 14 mg/dL (ref 6–23)
CO2: 31 mEq/L (ref 19–32)
Calcium: 9.2 mg/dL (ref 8.4–10.5)
Chloride: 102 mEq/L (ref 96–112)
Creatinine, Ser: 0.85 mg/dL (ref 0.40–1.20)
Glucose, Bld: 112 mg/dL — ABNORMAL HIGH (ref 70–99)
Potassium: 4.1 mEq/L (ref 3.5–5.3)
Sodium: 138 mEq/L (ref 135–145)
Total Bilirubin: 0.9 mg/dL (ref 0.3–1.2)
Total Protein: 7.3 g/dL (ref 6.0–8.3)

## 2009-05-08 HISTORY — PX: OTHER SURGICAL HISTORY: SHX169

## 2009-05-25 LAB — CBC WITH DIFFERENTIAL/PLATELET
BASO%: 0.5 % (ref 0.0–2.0)
Basophils Absolute: 0 10*3/uL (ref 0.0–0.1)
EOS%: 5.2 % (ref 0.0–7.0)
Eosinophils Absolute: 0.3 10*3/uL (ref 0.0–0.5)
HCT: 39.5 % (ref 34.8–46.6)
HGB: 13.8 g/dL (ref 11.6–15.9)
LYMPH%: 32.1 % (ref 14.0–49.7)
MCH: 34 pg (ref 25.1–34.0)
MCHC: 34.8 g/dL (ref 31.5–36.0)
MCV: 97.6 fL (ref 79.5–101.0)
MONO#: 0.5 10*3/uL (ref 0.1–0.9)
MONO%: 8.4 % (ref 0.0–14.0)
NEUT#: 3.5 10*3/uL (ref 1.5–6.5)
NEUT%: 53.8 % (ref 38.4–76.8)
Platelets: 273 10*3/uL (ref 145–400)
RBC: 4.05 10*6/uL (ref 3.70–5.45)
RDW: 12.8 % (ref 11.2–14.5)
WBC: 6.4 10*3/uL (ref 3.9–10.3)
lymph#: 2.1 10*3/uL (ref 0.9–3.3)

## 2009-05-25 LAB — COMPREHENSIVE METABOLIC PANEL
ALT: 18 U/L (ref 0–35)
AST: 21 U/L (ref 0–37)
Albumin: 3.7 g/dL (ref 3.5–5.2)
Alkaline Phosphatase: 41 U/L (ref 39–117)
BUN: 14 mg/dL (ref 6–23)
CO2: 28 mEq/L (ref 19–32)
Calcium: 9 mg/dL (ref 8.4–10.5)
Chloride: 103 mEq/L (ref 96–112)
Creatinine, Ser: 0.7 mg/dL (ref 0.40–1.20)
Glucose, Bld: 101 mg/dL — ABNORMAL HIGH (ref 70–99)
Potassium: 4.5 mEq/L (ref 3.5–5.3)
Sodium: 136 mEq/L (ref 135–145)
Total Bilirubin: 0.7 mg/dL (ref 0.3–1.2)
Total Protein: 6.9 g/dL (ref 6.0–8.3)

## 2009-05-27 ENCOUNTER — Encounter: Admission: RE | Admit: 2009-05-27 | Discharge: 2009-05-27 | Payer: Self-pay | Admitting: Oncology

## 2009-08-17 ENCOUNTER — Ambulatory Visit (HOSPITAL_COMMUNITY): Admission: RE | Admit: 2009-08-17 | Discharge: 2009-08-17 | Payer: Self-pay | Admitting: Oncology

## 2009-08-17 ENCOUNTER — Ambulatory Visit: Payer: Self-pay | Admitting: Oncology

## 2009-08-19 ENCOUNTER — Ambulatory Visit (HOSPITAL_COMMUNITY): Admission: RE | Admit: 2009-08-19 | Discharge: 2009-08-19 | Payer: Self-pay | Admitting: Oncology

## 2009-08-19 LAB — CBC WITH DIFFERENTIAL/PLATELET
BASO%: 0.4 % (ref 0.0–2.0)
Basophils Absolute: 0 10*3/uL (ref 0.0–0.1)
EOS%: 7.6 % — ABNORMAL HIGH (ref 0.0–7.0)
Eosinophils Absolute: 0.4 10*3/uL (ref 0.0–0.5)
HCT: 40.9 % (ref 34.8–46.6)
HGB: 14.2 g/dL (ref 11.6–15.9)
LYMPH%: 25.9 % (ref 14.0–49.7)
MCH: 33.9 pg (ref 25.1–34.0)
MCHC: 34.8 g/dL (ref 31.5–36.0)
MCV: 97.4 fL (ref 79.5–101.0)
MONO#: 0.5 10*3/uL (ref 0.1–0.9)
MONO%: 7.9 % (ref 0.0–14.0)
NEUT#: 3.4 10*3/uL (ref 1.5–6.5)
NEUT%: 58.2 % (ref 38.4–76.8)
Platelets: 266 10*3/uL (ref 145–400)
RBC: 4.2 10*6/uL (ref 3.70–5.45)
RDW: 13.2 % (ref 11.2–14.5)
WBC: 5.9 10*3/uL (ref 3.9–10.3)
lymph#: 1.5 10*3/uL (ref 0.9–3.3)

## 2009-08-19 LAB — COMPREHENSIVE METABOLIC PANEL
ALT: 20 U/L (ref 0–35)
AST: 22 U/L (ref 0–37)
Albumin: 4.1 g/dL (ref 3.5–5.2)
Alkaline Phosphatase: 41 U/L (ref 39–117)
BUN: 12 mg/dL (ref 6–23)
CO2: 30 mEq/L (ref 19–32)
Calcium: 9.1 mg/dL (ref 8.4–10.5)
Chloride: 102 mEq/L (ref 96–112)
Creatinine, Ser: 0.65 mg/dL (ref 0.40–1.20)
Glucose, Bld: 101 mg/dL — ABNORMAL HIGH (ref 70–99)
Potassium: 4.6 mEq/L (ref 3.5–5.3)
Sodium: 137 mEq/L (ref 135–145)
Total Bilirubin: 0.7 mg/dL (ref 0.3–1.2)
Total Protein: 7.7 g/dL (ref 6.0–8.3)

## 2009-08-19 LAB — CANCER ANTIGEN 27.29: CA 27.29: 14 U/mL (ref 0–39)

## 2009-11-15 ENCOUNTER — Ambulatory Visit (HOSPITAL_COMMUNITY): Admission: RE | Admit: 2009-11-15 | Discharge: 2009-11-15 | Payer: Self-pay | Admitting: Oncology

## 2009-11-16 ENCOUNTER — Ambulatory Visit: Payer: Self-pay | Admitting: Oncology

## 2009-11-18 LAB — CBC WITH DIFFERENTIAL/PLATELET
BASO%: 0.6 % (ref 0.0–2.0)
Basophils Absolute: 0 10*3/uL (ref 0.0–0.1)
EOS%: 4.8 % (ref 0.0–7.0)
Eosinophils Absolute: 0.3 10*3/uL (ref 0.0–0.5)
HCT: 40.2 % (ref 34.8–46.6)
HGB: 13.6 g/dL (ref 11.6–15.9)
LYMPH%: 25.2 % (ref 14.0–49.7)
MCH: 33 pg (ref 25.1–34.0)
MCHC: 33.8 g/dL (ref 31.5–36.0)
MCV: 97.7 fL (ref 79.5–101.0)
MONO#: 0.6 10*3/uL (ref 0.1–0.9)
MONO%: 8.9 % (ref 0.0–14.0)
NEUT#: 3.9 10*3/uL (ref 1.5–6.5)
NEUT%: 60.5 % (ref 38.4–76.8)
Platelets: 249 10*3/uL (ref 145–400)
RBC: 4.11 10*6/uL (ref 3.70–5.45)
RDW: 13.1 % (ref 11.2–14.5)
WBC: 6.5 10*3/uL (ref 3.9–10.3)
lymph#: 1.6 10*3/uL (ref 0.9–3.3)

## 2009-11-18 LAB — COMPREHENSIVE METABOLIC PANEL
ALT: 10 U/L (ref 0–35)
AST: 16 U/L (ref 0–37)
Albumin: 4.4 g/dL (ref 3.5–5.2)
Alkaline Phosphatase: 43 U/L (ref 39–117)
BUN: 14 mg/dL (ref 6–23)
CO2: 27 mEq/L (ref 19–32)
Calcium: 9 mg/dL (ref 8.4–10.5)
Chloride: 102 mEq/L (ref 96–112)
Creatinine, Ser: 0.72 mg/dL (ref 0.40–1.20)
Glucose, Bld: 97 mg/dL (ref 70–99)
Potassium: 4.5 mEq/L (ref 3.5–5.3)
Sodium: 139 mEq/L (ref 135–145)
Total Bilirubin: 0.5 mg/dL (ref 0.3–1.2)
Total Protein: 7.2 g/dL (ref 6.0–8.3)

## 2009-11-18 LAB — LACTATE DEHYDROGENASE: LDH: 153 U/L (ref 94–250)

## 2009-11-18 LAB — CANCER ANTIGEN 27.29: CA 27.29: 14 U/mL (ref 0–39)

## 2009-11-18 LAB — VITAMIN D 25 HYDROXY (VIT D DEFICIENCY, FRACTURES): Vit D, 25-Hydroxy: 43 ng/mL (ref 30–89)

## 2010-02-22 ENCOUNTER — Ambulatory Visit: Payer: Self-pay | Admitting: Oncology

## 2010-02-24 LAB — COMPREHENSIVE METABOLIC PANEL
ALT: 14 U/L (ref 0–35)
AST: 19 U/L (ref 0–37)
Albumin: 4.3 g/dL (ref 3.5–5.2)
Alkaline Phosphatase: 44 U/L (ref 39–117)
BUN: 14 mg/dL (ref 6–23)
CO2: 26 mEq/L (ref 19–32)
Calcium: 9.3 mg/dL (ref 8.4–10.5)
Chloride: 99 mEq/L (ref 96–112)
Creatinine, Ser: 0.71 mg/dL (ref 0.40–1.20)
Glucose, Bld: 93 mg/dL (ref 70–99)
Potassium: 4.4 mEq/L (ref 3.5–5.3)
Sodium: 137 mEq/L (ref 135–145)
Total Bilirubin: 0.5 mg/dL (ref 0.3–1.2)
Total Protein: 7.1 g/dL (ref 6.0–8.3)

## 2010-02-24 LAB — CBC WITH DIFFERENTIAL/PLATELET
BASO%: 0.7 % (ref 0.0–2.0)
Basophils Absolute: 0 10*3/uL (ref 0.0–0.1)
EOS%: 5.9 % (ref 0.0–7.0)
Eosinophils Absolute: 0.3 10*3/uL (ref 0.0–0.5)
HCT: 39.1 % (ref 34.8–46.6)
HGB: 13.8 g/dL (ref 11.6–15.9)
LYMPH%: 28.6 % (ref 14.0–49.7)
MCH: 34 pg (ref 25.1–34.0)
MCHC: 35.1 g/dL (ref 31.5–36.0)
MCV: 96.8 fL (ref 79.5–101.0)
MONO#: 0.5 10*3/uL (ref 0.1–0.9)
MONO%: 9.3 % (ref 0.0–14.0)
NEUT#: 3.2 10*3/uL (ref 1.5–6.5)
NEUT%: 55.5 % (ref 38.4–76.8)
Platelets: 254 10*3/uL (ref 145–400)
RBC: 4.04 10*6/uL (ref 3.70–5.45)
RDW: 13.3 % (ref 11.2–14.5)
WBC: 5.7 10*3/uL (ref 3.9–10.3)
lymph#: 1.6 10*3/uL (ref 0.9–3.3)

## 2010-02-24 LAB — LACTATE DEHYDROGENASE: LDH: 159 U/L (ref 94–250)

## 2010-02-24 LAB — CANCER ANTIGEN 27.29: CA 27.29: 24 U/mL (ref 0–39)

## 2010-05-28 ENCOUNTER — Other Ambulatory Visit: Payer: Self-pay | Admitting: Oncology

## 2010-05-28 DIAGNOSIS — Z9889 Other specified postprocedural states: Secondary | ICD-10-CM

## 2010-05-29 ENCOUNTER — Encounter: Payer: Self-pay | Admitting: Obstetrics & Gynecology

## 2010-06-22 ENCOUNTER — Ambulatory Visit
Admission: RE | Admit: 2010-06-22 | Discharge: 2010-06-22 | Disposition: A | Discharge status: Home / Self Care | Payer: BC Managed Care – PPO | Source: Ambulatory Visit | Attending: Oncology | Admitting: Oncology

## 2010-06-22 DIAGNOSIS — Z9889 Other specified postprocedural states: Secondary | ICD-10-CM

## 2010-07-24 LAB — GLUCOSE, CAPILLARY: Glucose-Capillary: 112 mg/dL — ABNORMAL HIGH (ref 70–99)

## 2010-08-12 LAB — COMPREHENSIVE METABOLIC PANEL
ALT: 18 U/L (ref 0–35)
AST: 27 U/L (ref 0–37)
Albumin: 4.1 g/dL (ref 3.5–5.2)
Alkaline Phosphatase: 57 U/L (ref 39–117)
BUN: 16 mg/dL (ref 6–23)
CO2: 27 mEq/L (ref 19–32)
Calcium: 9.6 mg/dL (ref 8.4–10.5)
Chloride: 102 mEq/L (ref 96–112)
Creatinine, Ser: 0.97 mg/dL (ref 0.4–1.2)
GFR calc Af Amer: >60 mL/min (ref >60)
GFR calc non Af Amer: >60 mL/min (ref >60)
Glucose, Bld: 155 mg/dL — ABNORMAL HIGH (ref 70–99)
Potassium: 4.1 mEq/L (ref 3.5–5.1)
Sodium: 137 mEq/L (ref 135–145)
Total Bilirubin: 0.5 mg/dL (ref 0.3–1.2)
Total Protein: 7.6 g/dL (ref 6.0–8.3)

## 2010-08-12 LAB — CBC
HCT: 40.8 % (ref 36.0–46.0)
Hemoglobin: 14.2 g/dL (ref 12.0–15.0)
MCHC: 34.7 g/dL (ref 30.0–36.0)
MCV: 98.5 fL (ref 78.0–100.0)
Platelets: 301 10*3/uL (ref 150–400)
RBC: 4.14 MIL/uL (ref 3.87–5.11)
RDW: 13.1 % (ref 11.5–15.5)
WBC: 9.2 10*3/uL (ref 4.0–10.5)

## 2010-08-12 LAB — DIFFERENTIAL
Basophils Absolute: 0 10*3/uL (ref 0.0–0.1)
Basophils Relative: 0 % (ref 0–1)
Eosinophils Absolute: 0.1 10*3/uL (ref 0.0–0.7)
Eosinophils Relative: 1 % (ref 0–5)
Lymphocytes Relative: 6 % — ABNORMAL LOW (ref 12–46)
Lymphs Abs: 0.5 10*3/uL — ABNORMAL LOW (ref 0.7–4.0)
Monocytes Absolute: 0.1 10*3/uL (ref 0.1–1.0)
Monocytes Relative: 1 % — ABNORMAL LOW (ref 3–12)
Neutro Abs: 8.5 10*3/uL — ABNORMAL HIGH (ref 1.7–7.7)
Neutrophils Relative %: 93 % — ABNORMAL HIGH (ref 43–77)

## 2010-08-12 LAB — LACTATE DEHYDROGENASE: LDH: 187 U/L (ref 94–250)

## 2010-08-12 LAB — GLUCOSE, CAPILLARY: Glucose-Capillary: 90 mg/dL (ref 70–99)

## 2010-08-13 LAB — URINALYSIS, ROUTINE W REFLEX MICROSCOPIC
Bilirubin Urine: NEGATIVE
Glucose, UA: NEGATIVE mg/dL
Hgb urine dipstick: NEGATIVE
Ketones, ur: NEGATIVE mg/dL
Nitrite: NEGATIVE
Protein, ur: NEGATIVE mg/dL
Specific Gravity, Urine: 1.008 (ref 1.005–1.030)
Urobilinogen, UA: 0.2 mg/dL (ref 0.0–1.0)
pH: 7 (ref 5.0–8.0)

## 2010-08-13 LAB — PREGNANCY, URINE: Preg Test, Ur: NEGATIVE

## 2010-08-16 LAB — DIFFERENTIAL
Basophils Absolute: 0 10*3/uL (ref 0.0–0.1)
Basophils Relative: 1 % (ref 0–1)
Eosinophils Absolute: 0.2 10*3/uL (ref 0.0–0.7)
Eosinophils Relative: 3 % (ref 0–5)
Lymphocytes Relative: 23 % (ref 12–46)
Lymphs Abs: 1.5 10*3/uL (ref 0.7–4.0)
Monocytes Absolute: 0.6 10*3/uL (ref 0.1–1.0)
Monocytes Relative: 9 % (ref 3–12)
Neutro Abs: 4.3 10*3/uL (ref 1.7–7.7)
Neutrophils Relative %: 65 % (ref 43–77)

## 2010-08-16 LAB — CBC
HCT: 39.3 % (ref 36.0–46.0)
Hemoglobin: 13.8 g/dL (ref 12.0–15.0)
MCHC: 35.1 g/dL (ref 30.0–36.0)
MCV: 99.6 fL (ref 78.0–100.0)
Platelets: 254 10*3/uL (ref 150–400)
RBC: 3.95 MIL/uL (ref 3.87–5.11)
RDW: 14.5 % (ref 11.5–15.5)
WBC: 6.7 10*3/uL (ref 4.0–10.5)

## 2010-08-16 LAB — COMPREHENSIVE METABOLIC PANEL
ALT: 50 U/L — ABNORMAL HIGH (ref 0–35)
AST: 32 U/L (ref 0–37)
Albumin: 4 g/dL (ref 3.5–5.2)
Alkaline Phosphatase: 57 U/L (ref 39–117)
BUN: 11 mg/dL (ref 6–23)
CO2: 31 mEq/L (ref 19–32)
Calcium: 9.6 mg/dL (ref 8.4–10.5)
Chloride: 101 mEq/L (ref 96–112)
Creatinine, Ser: 0.66 mg/dL (ref 0.4–1.2)
GFR calc Af Amer: >60 mL/min (ref >60)
GFR calc non Af Amer: >60 mL/min (ref >60)
Glucose, Bld: 104 mg/dL — ABNORMAL HIGH (ref 70–99)
Potassium: 4.2 mEq/L (ref 3.5–5.1)
Sodium: 141 mEq/L (ref 135–145)
Total Bilirubin: 0.7 mg/dL (ref 0.3–1.2)
Total Protein: 7.2 g/dL (ref 6.0–8.3)

## 2010-08-16 LAB — APTT: aPTT: 27 seconds (ref 24–37)

## 2010-08-16 LAB — PROTIME-INR
INR: 1 (ref 0.00–1.49)
Prothrombin Time: 13.8 seconds (ref 11.6–15.2)

## 2010-09-12 ENCOUNTER — Encounter (HOSPITAL_BASED_OUTPATIENT_CLINIC_OR_DEPARTMENT_OTHER): Payer: BC Managed Care – PPO | Admitting: Oncology

## 2010-09-12 ENCOUNTER — Other Ambulatory Visit: Payer: Self-pay | Admitting: Physician Assistant

## 2010-09-12 DIAGNOSIS — Z170 Estrogen receptor positive status [ER+]: Secondary | ICD-10-CM

## 2010-09-12 DIAGNOSIS — C50119 Malignant neoplasm of central portion of unspecified female breast: Secondary | ICD-10-CM

## 2010-09-12 LAB — CBC WITH DIFFERENTIAL/PLATELET
BASO%: 0.9 % (ref 0.0–2.0)
Basophils Absolute: 0.1 10*3/uL (ref 0.0–0.1)
EOS%: 6 % (ref 0.0–7.0)
Eosinophils Absolute: 0.3 10*3/uL (ref 0.0–0.5)
HCT: 37.9 % (ref 34.8–46.6)
HGB: 13.2 g/dL (ref 11.6–15.9)
LYMPH%: 29.6 % (ref 14.0–49.7)
MCH: 33.9 pg (ref 25.1–34.0)
MCHC: 34.8 g/dL (ref 31.5–36.0)
MCV: 97.3 fL (ref 79.5–101.0)
MONO#: 0.4 10*3/uL (ref 0.1–0.9)
MONO%: 8 % (ref 0.0–14.0)
NEUT#: 3 10*3/uL (ref 1.5–6.5)
NEUT%: 55.5 % (ref 38.4–76.8)
Platelets: 233 10*3/uL (ref 145–400)
RBC: 3.89 10*6/uL (ref 3.70–5.45)
RDW: 13.3 % (ref 11.2–14.5)
WBC: 5.5 10*3/uL (ref 3.9–10.3)
lymph#: 1.6 10*3/uL (ref 0.9–3.3)

## 2010-09-12 LAB — COMPREHENSIVE METABOLIC PANEL
ALT: 17 U/L (ref 0–35)
AST: 24 U/L (ref 0–37)
Albumin: 3.8 g/dL (ref 3.5–5.2)
Alkaline Phosphatase: 44 U/L (ref 39–117)
BUN: 13 mg/dL (ref 6–23)
CO2: 31 mEq/L (ref 19–32)
Calcium: 9.5 mg/dL (ref 8.4–10.5)
Chloride: 102 mEq/L (ref 96–112)
Creatinine, Ser: 0.67 mg/dL (ref 0.40–1.20)
Glucose, Bld: 147 mg/dL — ABNORMAL HIGH (ref 70–99)
Potassium: 4.1 mEq/L (ref 3.5–5.3)
Sodium: 139 mEq/L (ref 135–145)
Total Bilirubin: 0.3 mg/dL (ref 0.3–1.2)
Total Protein: 6.7 g/dL (ref 6.0–8.3)

## 2010-09-12 LAB — LACTATE DEHYDROGENASE: LDH: 169 U/L (ref 94–250)

## 2010-09-13 LAB — CANCER ANTIGEN 27.29: CA 27.29: 14 U/mL (ref 0–39)

## 2010-09-13 LAB — VITAMIN D 25 HYDROXY (VIT D DEFICIENCY, FRACTURES): Vit D, 25-Hydroxy: 42 ng/mL (ref 30–89)

## 2010-09-20 NOTE — Op Note (Signed)
Gina Cervantes, Gina Cervantes             ACCOUNT NO.:  [id number]   MEDICAL RECORD NO.:  [id number]          PATIENT TYPE:  AMB   LOCATION:  DSC                          FACILITY:  [facility]   PHYSICIAN:  Ollen Gross. Vernell Morgans, M.D. DATE OF BIRTH:  October 13, 1960   DATE OF PROCEDURE:  09/22/2008  DATE OF DISCHARGE:                               OPERATIVE REPORT   PREOPERATIVE DIAGNOSIS:  Locally advanced right breast cancer.   POSTOPERATIVE DIAGNOSIS:  Locally advanced right breast cancer.   PROCEDURE:  Right breast needle-localized lumpectomy and sentinel node  biopsy x4 with injection of blue dye.   SURGEON:  Ollen Gross. Vernell Morgans, MD   ANESTHESIA:  General via LMA.   PROCEDURE:  After informed consent was obtained, the patient was brought  to the operating room and placed in the supine position on the operating  room table.  After adequate induction of general anesthesia, the  patient's right breast, chest, and axillary area were prepped with  Betadine and draped in usual sterile manner.  Earlier in the day the  patient had undergone a wire localization procedure, and the wire was  entering the breast in about the 12 o'clock position and aimed  inferiorly.  The patient had also previously in the day undergone  injection of 1 mCi of technetium sulfur colloid in the subareolar  position.  At this point, 2 mL of methylene blue and 3 mL of injectable  saline were also injected in the subareolar position.  The breast was  massaged for several minutes.  A NeoProbe was used to identify a hot  spot in the right axilla.  Small transverse incision was made with a 15-  blade knife.  This incision was carried down through the skin and  subcutaneous tissue sharply with the electrocautery until the axilla was  entered.  A Weitlaner retractor was deployed.  Using the NeoProbe, 4 hot  lymph nodes were identified, 3 of which were blue.  The original  sentinel node had ex vivo counts of approximately 1200.  Each of  these  lymph nodes was dissected free by a combination of blunt hemostat  dissection and some sharp dissection with the electrocautery.  The  lymphatics were then clamped with hemostats, divided, and ligated with 3-  0 Vicryl ties.  Each of these lymph nodes was sent for touch preps as  sentinel nodes 1 through 4.  No other hot spots or palpable lymph nodes  were then identified once these 4 were removed.  Touch preps on all  these nodes were negative.  Hemostasis was achieved using the  electrocautery.  The deep layer of the wound was closed with interrupted  3-0 Vicryl stitches, and the skin was closed with a running 4-0 Monocryl  subcuticular stitch.  The area was then also infiltrated with 0.25%  Marcaine.  Attention was then turned to the right breast.  A curvilinear  incision was made along the edge of the areola along the upper portion  of the areola.  This was approximately the area also where the wire was  entering, and the tumor was identified.  This incision was carried down  through the skin and into the subcutaneous tissue sharply with the 15-  blade knife and electrocautery.  Once into the subcutaneous tissue, the  path of the wire could be appreciated.  A circular portion of breast  tissue around the path of the wire was then excised sharply with the  electrocautery.  Once this specimen was removed and separated from the  rest of the breast tissue, it was oriented according to the colors of  the paint kit.  Specimen radiograph was then taken and showed the clip  to be directly in the center of the specimen.  It was then sent to  Pathology for further evaluation.  Hemostasis was achieved using the  Bovie electrocautery.  The wound was irrigated with copious amounts of  saline.  The wound was infiltrated with 0.25% Marcaine.  The deep layer  of the wound was then closed with interrupted 3-0 Vicryl stitches, and  the skin  was closed with interrupted 4-0 Monocryl subcuticular  stitches.  Dermabond dressings were then applied.  The patient tolerated the  procedure well.  At the end of the case, all needle, sponge, and  instrument counts were correct.  The patient was then awakened and taken  to recovery room in stable condition.      Ollen Gross. Vernell Morgans, M.D.  Electronically Signed     PST/MEDQ  D:  09/22/2008  T:  09/23/2008  Job:  [id number]

## 2010-09-20 NOTE — Op Note (Signed)
Gina Cervantes, Gina Cervantes             ACCOUNT NO.:  [id number]   MEDICAL RECORD NO.:  [id number]          PATIENT TYPE:  AMB   LOCATION:  DAY                          FACILITY:  [facility]   PHYSICIAN:  Ollen Gross. Vernell Morgans, M.D. DATE OF BIRTH:  05/21/1960   DATE OF PROCEDURE:  02/21/2008  DATE OF DISCHARGE:                               OPERATIVE REPORT   PREOPERATIVE DIAGNOSIS:  Right breast cancer.   POSTOPERATIVE DIAGNOSIS:  Right breast cancer.   PROCEDURE:  Placement of left subclavian vein Port-A-Cath.   SURGEON:  Ollen Gross. Vernell Morgans, M.D.   ANESTHESIA:  General via LMA.   PROCEDURE IN DETAIL:  After informed consent was obtained, the patient  was brought to the operating room and placed in a supine position on the  operating table.  After introduction of general anesthesia a roll was  placed between the patient's shoulder blades.  The patient's left chest  and neck area were prepped with Betadine and draped in usual sterile  manner.  The area lateral to the bend of the clavicle on the left was  infiltrated with 0.25% Marcaine.  A small incision was made just lateral  to the bend of the clavicle with a 15 blade knife.  Using a large bore  finder needle on the Port-A-Cath kit we were able to slide the needle  behind the bend of the clavicle aiming towards the sternal notch and  were readily able to access the left subclavian vein without any  difficulty on the first pass.  A lateral wire was then placed through  the needle using the Seldinger technique without difficulty.  The wire  was confirmed in the central venous system using fluoroscopy.  This was  all done with the patient in Trendelenburg.  The incision lateral to the  bend of the clavicle was then extended medially and laterally with a 15  blade knife and a subcutaneous pocket was created by a combination of  blunt finger dissection and some sharp dissection with the  electrocautery.  Once the pocket was created the tubing was  attached to  the well.  The well was placed in the pocket and the length of the  tubing was also estimated using fluoroscopy.  The tubing was cut to  about 19 cm.  The sheath and dilator were then fed over the wire also  using the Seldinger technique without difficulty.  The dilator and wire  were then removed and a thumb was placed over the opening of the sheath.  The tubing of the port was then fed through the sheath.  Once the tubing  was in place the sheath was cracked and then gently separated keeping  the tubing in place.  Once the sheath had been completely removed  another fluoroscopy image showed the tip of the catheter to be in the  superior vena cava.  The anchor was then placed on the well to anchor  the tubing to the well.  The well was then anchored in the subcutaneous  pocket with interrupted 3-0 Prolene stitches.  The port was then  aspirated and there  was good easy blood return and then flushed  initially with a dilute heparin solution and then with a more  concentrated heparin solution.  The subcutaneous tissue was then closed  over the port with interrupted 3-0  Vicryl stitches and the skin was closed with a running 4-0 Monocryl  subcuticular stitch.  A Dermabond dressing was applied.  The patient  tolerated the procedure well.  At the end of the case all needle, sponge  and instrument counts were correct.  The patient was awakened and taken  to recovery in stable condition.      Ollen Gross. Vernell Morgans, M.D.  Electronically Signed     PST/MEDQ  D:  02/21/2008  T:  02/21/2008  Job:  [id number]

## 2011-02-07 LAB — PREGNANCY, URINE: Preg Test, Ur: NEGATIVE

## 2011-02-07 LAB — HEMOGLOBIN AND HEMATOCRIT, BLOOD
HCT: 43.3
Hemoglobin: 14.8

## 2011-03-07 ENCOUNTER — Telehealth: Payer: Self-pay | Admitting: *Deleted

## 2011-04-03 ENCOUNTER — Other Ambulatory Visit: Payer: Self-pay

## 2011-04-03 ENCOUNTER — Telehealth: Payer: Self-pay | Admitting: *Deleted

## 2011-04-03 ENCOUNTER — Other Ambulatory Visit (HOSPITAL_BASED_OUTPATIENT_CLINIC_OR_DEPARTMENT_OTHER): Payer: BC Managed Care – PPO | Admitting: Lab

## 2011-04-03 ENCOUNTER — Ambulatory Visit (HOSPITAL_BASED_OUTPATIENT_CLINIC_OR_DEPARTMENT_OTHER): Payer: BC Managed Care – PPO | Admitting: Oncology

## 2011-04-03 DIAGNOSIS — N912 Amenorrhea, unspecified: Secondary | ICD-10-CM

## 2011-04-03 DIAGNOSIS — E559 Vitamin D deficiency, unspecified: Secondary | ICD-10-CM

## 2011-04-03 DIAGNOSIS — Z170 Estrogen receptor positive status [ER+]: Secondary | ICD-10-CM

## 2011-04-03 DIAGNOSIS — C50919 Malignant neoplasm of unspecified site of unspecified female breast: Secondary | ICD-10-CM

## 2011-04-03 LAB — CBC WITH DIFFERENTIAL/PLATELET
BASO%: 0.5 % (ref 0.0–2.0)
Basophils Absolute: 0 10*3/uL (ref 0.0–0.1)
EOS%: 2.6 % (ref 0.0–7.0)
Eosinophils Absolute: 0.2 10*3/uL (ref 0.0–0.5)
HCT: 40.7 % (ref 34.8–46.6)
HGB: 13.9 g/dL (ref 11.6–15.9)
LYMPH%: 22.5 % (ref 14.0–49.7)
MCH: 33.1 pg (ref 25.1–34.0)
MCHC: 34.2 g/dL (ref 31.5–36.0)
MCV: 96.9 fL (ref 79.5–101.0)
MONO#: 0.4 10*3/uL (ref 0.1–0.9)
MONO%: 6.2 % (ref 0.0–14.0)
NEUT#: 4.1 10*3/uL (ref 1.5–6.5)
NEUT%: 68.2 % (ref 38.4–76.8)
Platelets: 273 10*3/uL (ref 145–400)
RBC: 4.2 10*6/uL (ref 3.70–5.45)
RDW: 13.1 % (ref 11.2–14.5)
WBC: 6 10*3/uL (ref 3.9–10.3)
lymph#: 1.4 10*3/uL (ref 0.9–3.3)

## 2011-04-03 LAB — COMPREHENSIVE METABOLIC PANEL
ALT: 14 U/L (ref 0–35)
AST: 21 U/L (ref 0–37)
Albumin: 4.2 g/dL (ref 3.5–5.2)
Alkaline Phosphatase: 37 U/L — ABNORMAL LOW (ref 39–117)
BUN: 14 mg/dL (ref 6–23)
CO2: 32 mEq/L (ref 19–32)
Calcium: 9.3 mg/dL (ref 8.4–10.5)
Chloride: 102 mEq/L (ref 96–112)
Creatinine, Ser: 0.82 mg/dL (ref 0.50–1.10)
Glucose, Bld: 126 mg/dL — ABNORMAL HIGH (ref 70–99)
Potassium: 4.2 mEq/L (ref 3.5–5.3)
Sodium: 140 mEq/L (ref 135–145)
Total Bilirubin: 0.4 mg/dL (ref 0.3–1.2)
Total Protein: 6.8 g/dL (ref 6.0–8.3)

## 2011-04-03 LAB — LACTATE DEHYDROGENASE: LDH: 159 U/L (ref 94–250)

## 2011-04-03 LAB — CANCER ANTIGEN 27.29: CA 27.29: 17 U/mL (ref 0–39)

## 2011-04-03 NOTE — Progress Notes (Signed)
Hematology and Oncology Follow Up Visit  Gina Cervantes [id number] 06/09/1960 50 y.o. 04/03/2011 3:50 PM   Principle Diagnosis: 50 year old woman with history of locally advanced ER PR HER-2 positive breast cancer status post therapy on protocol B. 41 wall by right lumpectomy and sentinel lymph node evaluation revealing complete pathological response. Herceptin therapy completed January 2000 and early on tamoxifen 2. suspected Schmorl's node at T12  Interim History:  Patient returns for followup. She is doing well. She continues on tamoxifen. She has been amenorrheic for 3 years. She has occasional hot flashes. She otherwise is doing well. Her last mammogram was done in February which was within normal limits. Appetite good weight is stable.  Medications: I have reviewed the patient's current medications.  Allergies: No Known Allergies  Past Medical History, Surgical history, Social history, and Family History were reviewed and updated.  Review of Systems: Constitutional:  Negative for fever, chills, night sweats, anorexia, weight loss, pain. Cardiovascular: no chest pain or dyspnea on exertion Respiratory: no cough, shortness of breath, or wheezing Neurological: no TIA or stroke symptoms Dermatological: negative ENT: negative Skin Gastrointestinal: no abdominal pain, change in bowel habits, or black or bloody stools Genito-Urinary: no dysuria, trouble voiding, or hematuria Hematological and Lymphatic: negative Breast: negative for breast lumps Musculoskeletal: negative Remaining ROS negative.  Physical Exam: Blood pressure 104/69, pulse 89, temperature 98.4 F (36.9 C), temperature source Oral, height 5\' 5"  (1.651 m), weight 166 lb 4.8 oz (75.433 kg). ECOG: 0 General appearance: alert and cooperative Head: Normocephalic, without obvious abnormality, atraumatic Neck: no adenopathy, no carotid bruit, no JVD, supple, symmetrical, trachea midline and thyroid not enlarged,  symmetric, no tenderness/mass/nodules Lymph nodes: Cervical, supraclavicular, and axillary nodes normal. Cardiac : Normal heart Pulmonary: Normal breath Breasts: Right breast status post lumpectomy, left breast normal. No nipple retraction or skin changes. Both axilla negative Abdomen: Normal Extremities normal Neuro: Normal  Lab Results: Lab Results  Component Value Date   WBC 6.0 04/03/2011   HGB 13.9 04/03/2011   HCT 40.7 04/03/2011   MCV 96.9 04/03/2011   PLT 273 04/03/2011     Chemistry      Component Value Date/Time   NA 139 09/12/2010 1521   K 4.1 09/12/2010 1521   CL 102 09/12/2010 1521   CO2 31 09/12/2010 1521   BUN 13 09/12/2010 1521   CREATININE 0.67 09/12/2010 1521      Component Value Date/Time   CALCIUM 9.5 09/12/2010 1521   ALKPHOS 44 09/12/2010 1521   AST 24 09/12/2010 1521   ALT 17 09/12/2010 1521   BILITOT 0.3 09/12/2010 1521       Radiological Studies: chest X-ray n/a Mammogram 2/12- normal Bone density n/a  Impression and Plan: Pleasant woman presenting with HER-2 positive, ER positive breast cancer now on tamoxifen. She is amenorrheic.  I have recommended that we check hormone levels before she returns in 6 months. She is likely that she will be permanently amenorrheic. She will likely not requireoophorectomy.  If her hormone levels indicate menopausal state I would recommend a switch to an AI. I reviewed these findings with the patient and her husband. She will have her followup mammogram in the new year.  More than 50% of the visit was spent in patient-related counselling   Pierce Crane, MD 11/26/20123:50 PM

## 2011-04-03 NOTE — Telephone Encounter (Signed)
gave patient appointment for 09-2011 

## 2011-05-22 ENCOUNTER — Other Ambulatory Visit: Payer: Self-pay | Admitting: Oncology

## 2011-05-22 DIAGNOSIS — Z853 Personal history of malignant neoplasm of breast: Secondary | ICD-10-CM

## 2011-06-28 ENCOUNTER — Ambulatory Visit: Payer: BC Managed Care – PPO | Admitting: Oncology

## 2011-06-28 DIAGNOSIS — C50919 Malignant neoplasm of unspecified site of unspecified female breast: Secondary | ICD-10-CM

## 2011-06-29 NOTE — Progress Notes (Signed)
     progress note  Gina Cervantes in today unscheduled visit today. As she was concerned about some adenopathy that she detected in the back of her head. She has been feeling well has no other complaints she. She has not had any fevers or other problems i.e. upper respiratory tract infections. Limited exam today showed a small less than 1 cm mobile soft lymph node in the occipital portion of her head. I doubt this is of a benign etiology and request that she followup with me should he change in any way.   Pierce Crane M.D. FRCP C.

## 2011-07-03 ENCOUNTER — Ambulatory Visit
Admission: RE | Admit: 2011-07-03 | Discharge: 2011-07-03 | Disposition: A | Discharge status: Home / Self Care | Payer: BC Managed Care – PPO | Source: Ambulatory Visit | Attending: Oncology | Admitting: Oncology

## 2011-07-03 DIAGNOSIS — Z853 Personal history of malignant neoplasm of breast: Secondary | ICD-10-CM

## 2011-07-03 LAB — HM MAMMOGRAPHY

## 2011-08-02 LAB — HM PAP SMEAR: HM Pap smear: NEGATIVE

## 2011-08-08 ENCOUNTER — Other Ambulatory Visit: Payer: Self-pay | Admitting: Oncology

## 2011-08-08 DIAGNOSIS — C50919 Malignant neoplasm of unspecified site of unspecified female breast: Secondary | ICD-10-CM

## 2011-08-08 NOTE — Telephone Encounter (Signed)
Will give tamoxifen #30 with one refill as per Dr. Renelda Loma note, may change therapy when patient returns in 6 months = 10/04/11

## 2011-09-25 ENCOUNTER — Other Ambulatory Visit: Payer: BC Managed Care – PPO | Admitting: Lab

## 2011-10-04 ENCOUNTER — Ambulatory Visit: Payer: BC Managed Care – PPO | Admitting: Oncology

## 2011-10-12 ENCOUNTER — Other Ambulatory Visit: Payer: Self-pay | Admitting: Oncology

## 2011-10-12 DIAGNOSIS — C50119 Malignant neoplasm of central portion of unspecified female breast: Secondary | ICD-10-CM

## 2011-11-01 ENCOUNTER — Other Ambulatory Visit: Payer: BC Managed Care – PPO | Admitting: Lab

## 2011-11-08 ENCOUNTER — Ambulatory Visit (HOSPITAL_BASED_OUTPATIENT_CLINIC_OR_DEPARTMENT_OTHER): Payer: BC Managed Care – PPO | Admitting: Oncology

## 2011-11-08 ENCOUNTER — Telehealth: Payer: Self-pay | Admitting: *Deleted

## 2011-11-08 ENCOUNTER — Other Ambulatory Visit (HOSPITAL_BASED_OUTPATIENT_CLINIC_OR_DEPARTMENT_OTHER): Payer: BC Managed Care – PPO | Admitting: Lab

## 2011-11-08 VITALS — BP 128/76 | HR 78 | Temp 98.4°F | Ht 65.0 in | Wt 161.2 lb

## 2011-11-08 DIAGNOSIS — C50919 Malignant neoplasm of unspecified site of unspecified female breast: Secondary | ICD-10-CM

## 2011-11-08 DIAGNOSIS — E559 Vitamin D deficiency, unspecified: Secondary | ICD-10-CM

## 2011-11-08 DIAGNOSIS — C50419 Malignant neoplasm of upper-outer quadrant of unspecified female breast: Secondary | ICD-10-CM

## 2011-11-08 DIAGNOSIS — Z170 Estrogen receptor positive status [ER+]: Secondary | ICD-10-CM

## 2011-11-08 LAB — CBC WITH DIFFERENTIAL/PLATELET
BASO%: 1 % (ref 0.0–2.0)
Basophils Absolute: 0.1 10*3/uL (ref 0.0–0.1)
EOS%: 4.9 % (ref 0.0–7.0)
Eosinophils Absolute: 0.4 10*3/uL (ref 0.0–0.5)
HCT: 40.3 % (ref 34.8–46.6)
HGB: 13.7 g/dL (ref 11.6–15.9)
LYMPH%: 24.1 % (ref 14.0–49.7)
MCH: 33 pg (ref 25.1–34.0)
MCHC: 33.9 g/dL (ref 31.5–36.0)
MCV: 97.2 fL (ref 79.5–101.0)
MONO#: 0.6 10*3/uL (ref 0.1–0.9)
MONO%: 8.1 % (ref 0.0–14.0)
NEUT#: 4.5 10*3/uL (ref 1.5–6.5)
NEUT%: 61.9 % (ref 38.4–76.8)
Platelets: 251 10*3/uL (ref 145–400)
RBC: 4.15 10*6/uL (ref 3.70–5.45)
RDW: 13.5 % (ref 11.2–14.5)
WBC: 7.3 10*3/uL (ref 3.9–10.3)
lymph#: 1.8 10*3/uL (ref 0.9–3.3)

## 2011-11-08 MED ORDER — ANASTROZOLE 1 MG PO TABS: 1.0000 mg | ORAL_TABLET | Freq: Every day | ORAL | Status: AC

## 2011-11-08 NOTE — Telephone Encounter (Signed)
05-14-2012 lab only 05-21-2012 md appointment 05-10-2012 at the breast center 1:30pm for the bone density

## 2011-11-08 NOTE — Progress Notes (Signed)
Hematology and Oncology Follow Up Visit  Gina Cervantes [id number] Jul 14, 1960 51 y.o. 11/08/2011 4:03 PM   Principle Diagnosis: 51 year old woman with history of locally advanced ER PR HER-2 positive breast cancer status post therapy on protocol B. 41 wall by right lumpectomy and sentinel lymph node evaluation revealing complete pathological response. Herceptin therapy completed January 2010 and early on tamoxifen 2. suspected Schmorl's node at T12  Interim History:  Patient returns for followup. She is doing well. She continues on tamoxifen. She has been amenorrheic for 3 years. She has occasional hot flashes. She otherwise is doing well. Her last mammogram was done in February which was within normal limits. Appetite good weight is stable.  Medications: I have reviewed the patient's current medications.  Allergies: No Known Allergies  Past Medical History, Surgical history, Social history, and Family History were reviewed and updated.  Review of Systems: Constitutional:  Negative for fever, chills, night sweats, anorexia, weight loss, pain. Cardiovascular: no chest pain or dyspnea on exertion Respiratory: no cough, shortness of breath, or wheezing Neurological: no TIA or stroke symptoms Dermatological: negative ENT: negative Skin Gastrointestinal: no abdominal pain, change in bowel habits, or black or bloody stools Genito-Urinary: no dysuria, trouble voiding, or hematuria Hematological and Lymphatic: negative Breast: negative for breast lumps Musculoskeletal: negative Remaining ROS negative.  Physical Exam: Blood pressure 128/76, pulse 78, temperature 98.4 F (36.9 C), temperature source Oral, height 5\' 5"  (1.651 m), weight 161 lb 3.2 oz (73.12 kg). ECOG: 0 General appearance: alert and cooperative Head: Normocephalic, without obvious abnormality, atraumatic Neck: no adenopathy, no carotid bruit, no JVD, supple, symmetrical, trachea midline and thyroid not enlarged, symmetric,  no tenderness/mass/nodules Lymph nodes: Cervical, supraclavicular, and axillary nodes normal. Cardiac : Normal heart Pulmonary: Normal breath Breasts: Right breast status post lumpectomy, left breast normal. No nipple retraction or skin changes. Both axilla negative Abdomen: Normal Extremities normal Neuro: Normal  Lab Results: Lab Results  Component Value Date   WBC 7.3 11/08/2011   HGB 13.7 11/08/2011   HCT 40.3 11/08/2011   MCV 97.2 11/08/2011   PLT 251 11/08/2011     Chemistry      Component Value Date/Time   NA 140 04/03/2011 1357   NA 140 04/03/2011 1357   K 4.2 04/03/2011 1357   K 4.2 04/03/2011 1357   CL 102 04/03/2011 1357   CL 102 04/03/2011 1357   CO2 32 04/03/2011 1357   CO2 32 04/03/2011 1357   BUN 14 04/03/2011 1357   BUN 14 04/03/2011 1357   CREATININE 0.82 04/03/2011 1357   CREATININE 0.82 04/03/2011 1357      Component Value Date/Time   CALCIUM 9.3 04/03/2011 1357   CALCIUM 9.3 04/03/2011 1357   ALKPHOS 37* 04/03/2011 1357   ALKPHOS 37* 04/03/2011 1357   AST 21 04/03/2011 1357   AST 21 04/03/2011 1357   ALT 14 04/03/2011 1357   ALT 14 04/03/2011 1357   BILITOT 0.4 04/03/2011 1357   BILITOT 0.4 04/03/2011 1357       Radiological Studies: chest X-ray n/a Mammogram 2/12- normal Bone density n/a  Impression and Plan: Pleasant woman presenting with HER-2 positive, ER positive breast cancer now on tamoxifen. She is amenorrheic.  I have recommended that we check hormone levels before she returns in 6 months. She is likely that she will be permanently amenorrheic. She will likely not requireoophorectomy.  If her hormone levels indicate menopausal state I would recommend a switch to an AI. I reviewed these findings with the  patient and her husband. She will have her followup mammogram in the new year.  More than 50% of the visit was spent in patient-related counselling   Pierce Crane, MD 7/3/20134:03 PM

## 2011-11-09 LAB — VITAMIN D 25 HYDROXY (VIT D DEFICIENCY, FRACTURES): Vit D, 25-Hydroxy: 38 ng/mL (ref 30–89)

## 2011-11-09 LAB — COMPREHENSIVE METABOLIC PANEL
ALT: 14 U/L (ref 0–35)
AST: 17 U/L (ref 0–37)
Albumin: 4.1 g/dL (ref 3.5–5.2)
Alkaline Phosphatase: 51 U/L (ref 39–117)
BUN: 14 mg/dL (ref 6–23)
CO2: 29 mEq/L (ref 19–32)
Calcium: 9.1 mg/dL (ref 8.4–10.5)
Chloride: 101 mEq/L (ref 96–112)
Creatinine, Ser: 0.69 mg/dL (ref 0.50–1.10)
Glucose, Bld: 104 mg/dL — ABNORMAL HIGH (ref 70–99)
Potassium: 3.8 mEq/L (ref 3.5–5.3)
Sodium: 137 mEq/L (ref 135–145)
Total Bilirubin: 0.4 mg/dL (ref 0.3–1.2)
Total Protein: 6.9 g/dL (ref 6.0–8.3)

## 2011-11-09 LAB — FOLLICLE STIMULATING HORMONE: FSH: 32.6 m[IU]/mL

## 2011-11-09 LAB — CANCER ANTIGEN 27.29: CA 27.29: 15 U/mL (ref 0–39)

## 2011-11-15 LAB — ESTRADIOL, ULTRA SENS: Estradiol, Ultra Sensitive: <2 pg/mL

## 2012-03-06 ENCOUNTER — Telehealth: Payer: Self-pay | Admitting: *Deleted

## 2012-03-06 NOTE — Telephone Encounter (Signed)
Mailed out letter and calendar to inform the patient of the new date and time 

## 2012-04-29 ENCOUNTER — Telehealth: Payer: Self-pay | Admitting: *Deleted

## 2012-04-29 NOTE — Telephone Encounter (Signed)
Called pt to inform her that Dr. Donnie Coffin will no longer be with our practice as of 05/08/12.  Answered all questions at this time.  Confirmed 06/03/12 appt w/ pt.

## 2012-05-10 ENCOUNTER — Ambulatory Visit
Admission: RE | Admit: 2012-05-10 | Discharge: 2012-05-10 | Disposition: A | Discharge status: Home / Self Care | Payer: BC Managed Care – PPO | Source: Ambulatory Visit | Attending: Oncology | Admitting: Oncology

## 2012-05-10 DIAGNOSIS — E559 Vitamin D deficiency, unspecified: Secondary | ICD-10-CM

## 2012-05-10 DIAGNOSIS — C50919 Malignant neoplasm of unspecified site of unspecified female breast: Secondary | ICD-10-CM

## 2012-05-11 ENCOUNTER — Encounter: Payer: Self-pay | Admitting: Oncology

## 2012-05-14 ENCOUNTER — Other Ambulatory Visit: Payer: BC Managed Care – PPO | Admitting: Lab

## 2012-05-21 ENCOUNTER — Ambulatory Visit: Payer: BC Managed Care – PPO | Admitting: Oncology

## 2012-05-22 ENCOUNTER — Other Ambulatory Visit: Payer: BC Managed Care – PPO | Admitting: Lab

## 2012-05-27 ENCOUNTER — Other Ambulatory Visit: Payer: Self-pay | Admitting: Oncology

## 2012-05-27 DIAGNOSIS — Z853 Personal history of malignant neoplasm of breast: Secondary | ICD-10-CM

## 2012-05-27 DIAGNOSIS — Z9889 Other specified postprocedural states: Secondary | ICD-10-CM

## 2012-05-29 ENCOUNTER — Ambulatory Visit: Payer: BC Managed Care – PPO | Admitting: Oncology

## 2012-05-31 ENCOUNTER — Other Ambulatory Visit: Payer: Self-pay | Admitting: *Deleted

## 2012-05-31 DIAGNOSIS — C50919 Malignant neoplasm of unspecified site of unspecified female breast: Secondary | ICD-10-CM

## 2012-06-03 ENCOUNTER — Encounter: Payer: Self-pay | Admitting: Oncology

## 2012-06-03 ENCOUNTER — Ambulatory Visit (HOSPITAL_BASED_OUTPATIENT_CLINIC_OR_DEPARTMENT_OTHER): Payer: BC Managed Care – PPO | Admitting: Oncology

## 2012-06-03 ENCOUNTER — Other Ambulatory Visit (HOSPITAL_BASED_OUTPATIENT_CLINIC_OR_DEPARTMENT_OTHER): Payer: BC Managed Care – PPO

## 2012-06-03 ENCOUNTER — Telehealth: Payer: Self-pay | Admitting: Oncology

## 2012-06-03 VITALS — BP 118/76 | HR 94 | Temp 98.6°F | Resp 20 | Ht 65.0 in | Wt 164.1 lb

## 2012-06-03 DIAGNOSIS — E559 Vitamin D deficiency, unspecified: Secondary | ICD-10-CM

## 2012-06-03 DIAGNOSIS — Z170 Estrogen receptor positive status [ER+]: Secondary | ICD-10-CM

## 2012-06-03 DIAGNOSIS — C50419 Malignant neoplasm of upper-outer quadrant of unspecified female breast: Secondary | ICD-10-CM

## 2012-06-03 DIAGNOSIS — C50911 Malignant neoplasm of unspecified site of right female breast: Secondary | ICD-10-CM

## 2012-06-03 DIAGNOSIS — C50919 Malignant neoplasm of unspecified site of unspecified female breast: Secondary | ICD-10-CM

## 2012-06-03 DIAGNOSIS — K644 Residual hemorrhoidal skin tags: Secondary | ICD-10-CM

## 2012-06-03 LAB — COMPREHENSIVE METABOLIC PANEL (CC13)
ALT: 19 U/L (ref 0–55)
AST: 17 U/L (ref 5–34)
Albumin: 3.6 g/dL (ref 3.5–5.0)
Alkaline Phosphatase: 75 U/L (ref 40–150)
BUN: 12.9 mg/dL (ref 7.0–26.0)
CO2: 32 mEq/L — ABNORMAL HIGH (ref 22–29)
Calcium: 9.8 mg/dL (ref 8.4–10.4)
Chloride: 96 mEq/L — ABNORMAL LOW (ref 98–107)
Creatinine: 0.8 mg/dL (ref 0.6–1.1)
Glucose: 140 mg/dl — ABNORMAL HIGH (ref 70–99)
Potassium: 4 mEq/L (ref 3.5–5.1)
Sodium: 139 mEq/L (ref 136–145)
Total Bilirubin: 0.39 mg/dL (ref 0.20–1.20)
Total Protein: 8 g/dL (ref 6.4–8.3)

## 2012-06-03 LAB — CBC WITH DIFFERENTIAL/PLATELET
BASO%: 0.7 % (ref 0.0–2.0)
Basophils Absolute: 0.1 10*3/uL (ref 0.0–0.1)
EOS%: 3.8 % (ref 0.0–7.0)
Eosinophils Absolute: 0.3 10*3/uL (ref 0.0–0.5)
HCT: 43.8 % (ref 34.8–46.6)
HGB: 15.3 g/dL (ref 11.6–15.9)
LYMPH%: 25 % (ref 14.0–49.7)
MCH: 33.6 pg (ref 25.1–34.0)
MCHC: 35.1 g/dL (ref 31.5–36.0)
MCV: 95.8 fL (ref 79.5–101.0)
MONO#: 0.6 10*3/uL (ref 0.1–0.9)
MONO%: 8.2 % (ref 0.0–14.0)
NEUT#: 4.4 10*3/uL (ref 1.5–6.5)
NEUT%: 62.3 % (ref 38.4–76.8)
Platelets: 281 10*3/uL (ref 145–400)
RBC: 4.57 10*6/uL (ref 3.70–5.45)
RDW: 13.3 % (ref 11.2–14.5)
WBC: 7.1 10*3/uL (ref 3.9–10.3)
lymph#: 1.8 10*3/uL (ref 0.9–3.3)

## 2012-06-03 MED ORDER — ANASTROZOLE 1 MG PO TABS: 1.0000 mg | ORAL_TABLET | Freq: Every day | ORAL | Status: DC

## 2012-06-03 NOTE — Telephone Encounter (Signed)
gv pt appt schedule for July. Per 1/27 pof lb/fu 8mos. Per pt KK asked her to tell me schedule for 6mos instead. S/w Misty Stanley @ Dr. Trilby Drummer office [phone]) requesting appt. Per Misty Stanley fax notes to [phone] Dr. Loreta Ave will review and office will call back w/appt. Pt aware. Fax coversheet w/copy of referral to HIM to send notes to Dr. Loreta Ave.

## 2012-06-03 NOTE — Progress Notes (Signed)
OFFICE PROGRESS NOTE  CC Dr. Sigmund Hazel Dr. Chevis Pretty  DIAGNOSIS: 52 year old female with invasive ductal carcinoma stage II originally diagnosed in October 2009.  PRIOR THERAPY:  #1 patient originally presented for her annual screening mammogram in December 2008. She had tenderness and lump in the right upper outer quadrant near the area lower border in mid-September. She had a right breast mammogram and ultrasound performed 02/04/2008. The ultrasound showed a 1.1 x 1.9 cm mass. Biopsy was performed on 02/04/2008 the biopsy showed an invasive ductal carcinoma that was ER positive PR +100% and 17% respectively. Proliferation marker Ki-67 was elevated at 44% HER-2/neu was positive at 3+. Patient had MRI of the breasts performed on 02/11/2008. This showed a 2.1 x 1.9 x 1.7 cm mass in the 12:00 position of the right breast some mildly prominent right axillary lymph nodes were seen.  #2 patient was seen by Dr. Chevis Pretty for consideration lumpectomy and/or any adjuvant therapy. She went on to have 4 cycles of a.c. starting on 03/03/2008 through 03/05/2008 following NSABP B. 41 clinical. She then received weekly Taxol with Herceptin x3 and then Herceptin only on week 4.  #3 she went on to have her surgery on 09/22/2008, the final no evidence of residual invasive carcinoma. 5 sentinel nodes were negative for metastatic disease. The tumor was ER +100% PR +17% proliferation marker Ki-67 44% HER-2/neu positive at 3+.  #4 patient then went on to receive radiation therapy to the right breast from 6 2010 06/15/2008.  #5 after completing radiation she was begun on tamoxifen 20 mg daily starting 12/15/2008. She also completed her Herceptin in 05/25/2009.  #6 patient developed some back pain in October 2010 MRI CT PET scan were performed she was referred to Dr. Newell Coral she had a Schmorl node.  #7 after completing approximately 65 years of tamoxifen patient was changed to Arimidex 1 mg daily starting in  July 2013. A total of 5 years therapy will be planned of the aromatase inhibitor  CURRENT THERAPY: Arimidex 1 mg daily  INTERVAL HISTORY: Gina Cervantes 52 y.o. female returns for followup visit today. Clinically she seems to be doing well she did have a bone density scan performed does not reveal any evidence of osteopenia or osteoporosis. She does have a mammogram scheduled for 07/03/2012. She herself is denying any fevers chills night sweats headaches shortness of breath chest pains palpitations she has no myalgias and arthralgias. She does have hot flashes. She denies having any vaginal bleeding no hematuria hematochezia melena hemoptysis or hematemesis. Remainder of the 10 point review of systems is negative  MEDICAL HISTORY: Past Medical History  Diagnosis Date  . Breast cancer 2009    ALLERGIES:   has no known allergies.  MEDICATIONS:  Current Outpatient Prescriptions  Medication Sig Dispense Refill  . anastrozole (ARIMIDEX) 1 MG tablet Take 1 mg by mouth daily.      . calcium-vitamin D 250-100 MG-UNIT per tablet Take 1 tablet by mouth daily.        . Multiple Vitamins-Minerals (MULTIVITAMIN WITH MINERALS) tablet Take 1 tablet by mouth daily.      . vitamin B-12 (CYANOCOBALAMIN) 500 MCG tablet Take 500 mcg by mouth daily.        SURGICAL HISTORY:  Past Surgical History  Procedure Date  . Breast lumpectomy with axillary lymph node biopsy 2009    REVIEW OF SYSTEMS:  Pertinent items are noted in HPI.   HEALTH MAINTENANCE:  Mammogram 2013 Colonoscopy none Bone  Scan  05/2012 Pap Smear 06/2011 Eye Exam 2013 Vitamin D yes Lipid Panel yes  PHYSICAL EXAMINATION: Blood pressure 118/76, pulse 94, temperature 98.6 F (37 C), temperature source Oral, resp. rate 20, height 5\' 5"  (1.651 m), weight 164 lb 1.6 oz (74.435 kg). Body mass index is 27.31 kg/(m^2). ECOG PERFORMANCE STATUS: 0 - Asymptomatic   General appearance: alert, cooperative and appears stated age Neck: no  adenopathy, no carotid bruit, no JVD, supple, symmetrical, trachea midline and thyroid not enlarged, symmetric, no tenderness/mass/nodules Lymph nodes: Cervical, supraclavicular, and axillary nodes normal. Resp: clear to auscultation bilaterally Back: symmetric, no curvature. ROM normal. No CVA tenderness. Cardio: regular rate and rhythm GI: soft, non-tender; bowel sounds normal; no masses,  no organomegaly Extremities: extremities normal, atraumatic, no cyanosis or edema Neurologic: Grossly normal   LABORATORY DATA: Lab Results  Component Value Date   WBC 7.1 06/03/2012   HGB 15.3 06/03/2012   HCT 43.8 06/03/2012   MCV 95.8 06/03/2012   PLT 281 06/03/2012      Chemistry      Component Value Date/Time   NA 137 11/08/2011 1442   K 3.8 11/08/2011 1442   CL 101 11/08/2011 1442   CO2 29 11/08/2011 1442   BUN 14 11/08/2011 1442   CREATININE 0.69 11/08/2011 1442      Component Value Date/Time   CALCIUM 9.1 11/08/2011 1442   ALKPHOS 51 11/08/2011 1442   AST 17 11/08/2011 1442   ALT 14 11/08/2011 1442   BILITOT 0.4 11/08/2011 1442       RADIOGRAPHIC STUDIES:  Dg Bone Density  05/10/2012  *RADIOLOGY REPORT*  Clinical Data: Post menopausal osteoporosis screening.  DUAL X-RAY ABSORPTIOMETRY (DXA) FOR BONE MINERAL DENSITY  AP LUMBAR SPINE L1-L4  Bone Mineral Density (BMD):            1.128 g/cm2 Young Adult T Score:                          0.7 Z Score:                                                1.4  LEFT FEMUR NECK  Bone Mineral Density (BMD):             0.853 g/cm2 Young Adult T Score:                           0.0 Z Score:                                                 0.7  ASSESSMENT:  Patient's diagnostic category is NORMAL by WHO Criteria.  FRACTURE RISK: NOT INCREASED  FRAX: Not calculated due to a T score at or above -1.0.  Comparison: None  RECOMMENDATIONS:  Effective therapies are available in the form of bisphosphonates, selective estrogen receptor modulators, biologic agents, and hormone  replacement therapy (for women).  All patients should ensure an adequate intake of dietary calcium (1200mg  daily) and vitamin D (800 IU daily) unless contraindicated.  All treatment decisions require clinical judgement and consideration of individual patient factors, including patient preferences, co-morbidities, previous drug use, risk factors not  captured in the FRAX model (e.g., frailty, falls, vitamin D deficiency, increased bone turnover, interval significant decline in bone density) and possible under-or over-estimation of fracture risk by FRAX.  The National Osteoporosis Foundation recommends that FDA-approved medical therapies be considered in postmenopausal women and mean age 24 or older with a:        1)     Hip or vertebral (clinical or morphometric) fracture.           2)    T-score of -2.5 or lower at the spine or hip. 3)    Ten-year fracture probability by FRAX of 3% or greater for hip fracture or 20% or greater for major osteoporotic fracture. FOLLOW-UP:  People with diagnosed cases of osteoporosis or at high risk for fracture should have regular bone mineral density tests.  For patients eligible for Medicare, routine testing is allowed once every 2 years.  The testing frequency can be increased to one year for patients who have rapidly progressing disease, those who are receiving or discontinuing medical therapy to restore bone mass, or have additional risk factors.  World Science writer [facility]) Criteria:  Normal: T scores from +1.0 to -1.0 Low Bone Mass (Osteopenia): T scores between -1.0 and -2.5 Osteoporosis: T scores -2.5 and below  Comparison to Reference Population:  T score is the key measure used in the diagnosis of osteoporosis and relative risk determination for fracture.  It provides a value for bone mass relative to the mean bone mass of a young adult reference population expressed in terms of standard deviation (SD).  Z score is the age-matched score showing the patient's values  compared to a population matched for age, sex, and race.  This is also expressed in terms of standard deviation.  The patient may have values that compare favorably to the age-matched values and still be at increased risk for fracture.   Original Report Authenticated By: Elberta Fortis, M.D.     ASSESSMENT: 52 year old female with  #1 diagnosis of locally advanced invasive ductal carcinoma of the right breast originally measuring by MRI 2.1 cm. Patient was enrolled on NSABP B. 41 clinical trial using neoadjuvant chemotherapy and Herceptin. She received this from 03/03/2008 to 03/05/2008 and subsequently was switched to Taxol from 05/26/2008. Patient had her final surgery on 09/22/2008 she did not have any evidence of residual disease. She went on to receive radiation therapy from 10/15/2008 to August 2010. She also began tamoxifen 20 mg starting in August 2010 and she was switched to a remedy axilla 1 mg daily starting in July 2013  #2 patient also has had genetic testing performed back in April 2010 she was negative for both BRCA1 and BRCA2 gene mutation.  #3 most recent bone density was 05/10/2012 without any evidence of increased fracture risk. She also has a mammogram scheduled for 07/03/2012.   PLAN:   #1 patient will continue Arimidex 1 mg daily risks and benefits of this were discussed with the patient.  #2 she will keep her appointments for the mammogram.  #3 I will plan on seeing her back in 6 months time for followup.   All questions were answered. The patient knows to call the clinic with any problems, questions or concerns. We can certainly see the patient much sooner if necessary.  I spent 60 minutes counseling the patient face to face. The total time spent in the appointment was 60 minutes.    Drue Second, MD Medical/Oncology [facility] [phone] (beeper) [phone] (Office)  06/03/2012, 2:02 PM

## 2012-06-03 NOTE — Patient Instructions (Addendum)
Continue arimidex 1 mg daily  Refer to GI for hemorrhoids  I will see you back in 8 months

## 2012-06-04 ENCOUNTER — Telehealth: Payer: Self-pay | Admitting: Oncology

## 2012-06-04 LAB — FOLLICLE STIMULATING HORMONE: FSH: 64 m[IU]/mL

## 2012-06-04 LAB — VITAMIN D 25 HYDROXY (VIT D DEFICIENCY, FRACTURES): Vit D, 25-Hydroxy: 56 ng/mL (ref 30–89)

## 2012-06-04 NOTE — Telephone Encounter (Signed)
Faxed pt medical records to Dr. Mann °

## 2012-06-09 LAB — ESTRADIOL, ULTRA SENS: Estradiol, Ultra Sensitive: <2 pg/mL

## 2012-07-31 ENCOUNTER — Ambulatory Visit (INDEPENDENT_AMBULATORY_CARE_PROVIDER_SITE_OTHER): Payer: BC Managed Care – PPO | Admitting: General Surgery

## 2012-07-31 ENCOUNTER — Encounter (INDEPENDENT_AMBULATORY_CARE_PROVIDER_SITE_OTHER): Payer: Self-pay | Admitting: General Surgery

## 2012-07-31 VITALS — BP 98/70 | HR 84 | Resp 18 | Ht 66.0 in | Wt 160.8 lb

## 2012-07-31 DIAGNOSIS — D490 Neoplasm of unspecified behavior of digestive system: Secondary | ICD-10-CM | POA: Insufficient documentation

## 2012-07-31 MED ORDER — LIDOCAINE HCL 2 % EX GEL: CUTANEOUS | Status: DC

## 2012-07-31 NOTE — Progress Notes (Signed)
Patient ID: Gina Cervantes, female   DOB: 06-Sep-1960, 52 y.o.   MRN: [id number]  No chief complaint on file.   HPI Gina Cervantes is a 52 y.o. female.   HPI  She is referred by Dr. Loreta Ave for evaluation of bleeding hemorrhoids.  She states she's had a known hemorrhoids for some time. Past 6 weeks she's had increased pain and bleeding from it especially following bowel movements. The pain has been fairly constant recently. She has tried over-the-counter medication as well as something prescribed but has had no relief. She's tried warm water tub baths. She told Dr. Loreta Ave yesterday and was referred here to be seen for this problem.  Past Medical History  Diagnosis Date  . Breast cancer 2009    Past Surgical History  Procedure Laterality Date  . Breast lumpectomy with axillary lymph node biopsy  2009  . Cesarean section      No family history on file.  Social History History  Substance Use Topics  . Smoking status: Current Some Day Smoker  . Smokeless tobacco: Never Used  . Alcohol Use: 3.0 oz/week    5 Glasses of wine per week    No Known Allergies  Current Outpatient Prescriptions  Medication Sig Dispense Refill  . b complex vitamins tablet Take 1 tablet by mouth daily.      Marland Kitchen anastrozole (ARIMIDEX) 1 MG tablet Take 1 tablet (1 mg total) by mouth daily.  90 tablet  12  . calcium-vitamin D 250-100 MG-UNIT per tablet Take 1 tablet by mouth daily.        . Multiple Vitamins-Minerals (MULTIVITAMIN WITH MINERALS) tablet Take 1 tablet by mouth daily.       No current facility-administered medications for this visit.    Review of Systems Review of Systems  Constitutional: Negative.   Respiratory: Negative.   Cardiovascular: Negative.   Gastrointestinal: Positive for anal bleeding and rectal pain. Negative for abdominal pain, diarrhea and constipation.  Genitourinary: Negative.     Blood pressure 98/70, pulse 84, resp. rate 18, height 5\' 6"  (1.676 m), weight 160 lb 12.8 oz  (72.938 kg).  Physical Exam Physical Exam  Constitutional: She appears well-developed and well-nourished. No distress.  HENT:  Head: Normocephalic and atraumatic.  Cardiovascular: Normal rate and regular rhythm.   Pulmonary/Chest: Effort normal and breath sounds normal.  Abdominal: Soft. She exhibits no distension and no mass. There is no tenderness.  Genitourinary:  There is a firm left-sided perianal mass that extends into the anal canal. There is a fissure that bleeds in the middle of this.    Data Reviewed Notes in Epic.  Assessment    Left-sided perianal mass extending into the anal canal. This has a fissure in the middle. This is suspicious for neoplasm rather than hemorrhoidal disease.     Plan    We'll schedule exam under anesthesia and biopsy of the mass in the operating room. The procedure, rationale, and risks have been discussed with her. Risks include but are not limited to bleeding, infection, anesthesia problems, need for other procedures. She seems to understand this and agrees with the plan. I will prescribe some Xylocaine jelly as needed for comfort.        Taneal Sonntag J 07/31/2012, 9:00 AM

## 2012-07-31 NOTE — Patient Instructions (Signed)
Call [phone] when you want to schedule the surgery.

## 2012-08-01 ENCOUNTER — Other Ambulatory Visit (INDEPENDENT_AMBULATORY_CARE_PROVIDER_SITE_OTHER): Payer: Self-pay | Admitting: General Surgery

## 2012-08-01 NOTE — Progress Notes (Signed)
08-01-12 Need MD order entry in Epic- Presurgical testing visit will be 08-02-12 9:00am. W. Quinntin Malter,RN

## 2012-08-02 ENCOUNTER — Encounter (HOSPITAL_COMMUNITY): Payer: Self-pay

## 2012-08-02 ENCOUNTER — Encounter (HOSPITAL_COMMUNITY): Payer: Self-pay | Admitting: Pharmacy Technician

## 2012-08-02 ENCOUNTER — Encounter (HOSPITAL_COMMUNITY)
Admission: RE | Admit: 2012-08-02 | Discharge: 2012-08-02 | Disposition: A | Discharge status: Home / Self Care | Payer: BC Managed Care – PPO | Source: Ambulatory Visit | Attending: General Surgery | Admitting: General Surgery

## 2012-08-02 HISTORY — DX: Pneumonia, unspecified organism: J18.9

## 2012-08-02 HISTORY — DX: Anxiety disorder, unspecified: F41.9

## 2012-08-02 LAB — COMPREHENSIVE METABOLIC PANEL
ALT: 16 U/L (ref 0–35)
AST: 18 U/L (ref 0–37)
Albumin: 3.6 g/dL (ref 3.5–5.2)
Alkaline Phosphatase: 68 U/L (ref 39–117)
BUN: 9 mg/dL (ref 6–23)
CO2: 29 mEq/L (ref 19–32)
Calcium: 9.1 mg/dL (ref 8.4–10.5)
Chloride: 100 mEq/L (ref 96–112)
Creatinine, Ser: 0.59 mg/dL (ref 0.50–1.10)
GFR calc Af Amer: >90 mL/min (ref >90)
GFR calc non Af Amer: >90 mL/min (ref >90)
Glucose, Bld: 97 mg/dL (ref 70–99)
Potassium: 4.3 mEq/L (ref 3.5–5.1)
Sodium: 140 mEq/L (ref 135–145)
Total Bilirubin: 0.3 mg/dL (ref 0.3–1.2)
Total Protein: 7.8 g/dL (ref 6.0–8.3)

## 2012-08-02 LAB — CBC WITH DIFFERENTIAL/PLATELET
Basophils Absolute: 0 10*3/uL (ref 0.0–0.1)
Basophils Relative: 1 % (ref 0–1)
Eosinophils Absolute: 0.4 10*3/uL (ref 0.0–0.7)
Eosinophils Relative: 5 % (ref 0–5)
HCT: 42.6 % (ref 36.0–46.0)
Hemoglobin: 14.7 g/dL (ref 12.0–15.0)
Lymphocytes Relative: 19 % (ref 12–46)
Lymphs Abs: 1.5 10*3/uL (ref 0.7–4.0)
MCH: 33.4 pg (ref 26.0–34.0)
MCHC: 34.5 g/dL (ref 30.0–36.0)
MCV: 96.8 fL (ref 78.0–100.0)
Monocytes Absolute: 0.7 10*3/uL (ref 0.1–1.0)
Monocytes Relative: 9 % (ref 3–12)
Neutro Abs: 5 10*3/uL (ref 1.7–7.7)
Neutrophils Relative %: 66 % (ref 43–77)
Platelets: 319 10*3/uL (ref 150–400)
RBC: 4.4 MIL/uL (ref 3.87–5.11)
RDW: 13 % (ref 11.5–15.5)
WBC: 7.6 10*3/uL (ref 4.0–10.5)

## 2012-08-02 LAB — PROTIME-INR
INR: 1.01 (ref 0.00–1.49)
Prothrombin Time: 13.2 seconds (ref 11.6–15.2)

## 2012-08-02 LAB — SURGICAL PCR SCREEN
MRSA, PCR: NEGATIVE
Staphylococcus aureus: NEGATIVE

## 2012-08-02 LAB — CEA: CEA: 5.5 ng/mL — ABNORMAL HIGH (ref 0.0–5.0)

## 2012-08-02 NOTE — Patient Instructions (Addendum)
YOUR SURGERY IS SCHEDULED AT [facility]  ON:  Friday  4/4  REPORT TO [facility] AT:  8:00 AM      PHONE # FOR SHORT STAY IS [phone]  DO NOT EAT OR DRINK ANYTHING AFTER MIDNIGHT THE NIGHT BEFORE YOUR SURGERY.  YOU MAY BRUSH YOUR TEETH, RINSE OUT YOUR MOUTH--BUT NO WATER, NO FOOD, NO CHEWING GUM, NO MINTS, NO CANDIES, NO CHEWING TOBACCO.  PLEASE TAKE THE FOLLOWING MEDICATIONS THE AM OF YOUR SURGERY WITH A FEW SIPS OF WATER:  NO MEDICINES TO TAKE    DO NOT BRING VALUABLES, MONEY, CREDIT CARDS.  DO NOT WEAR JEWELRY, MAKE-UP, NAIL POLISH AND NO METAL PINS OR CLIPS IN YOUR HAIR. CONTACT LENS, DENTURES / PARTIALS, GLASSES SHOULD NOT BE WORN TO SURGERY AND IN MOST CASES-HEARING AIDS WILL NEED TO BE REMOVED.  BRING YOUR GLASSES CASE, ANY EQUIPMENT NEEDED FOR YOUR CONTACT LENS. FOR PATIENTS ADMITTED TO THE HOSPITAL--CHECK OUT TIME THE DAY OF DISCHARGE IS 11:00 AM.  ALL INPATIENT ROOMS ARE PRIVATE - WITH BATHROOM, TELEPHONE, TELEVISION AND WIFI INTERNET.  IF YOU ARE BEING DISCHARGED THE SAME DAY OF YOUR SURGERY--YOU CAN NOT DRIVE YOURSELF HOME--AND SHOULD NOT GO HOME ALONE BY TAXI OR BUS.  NO DRIVING OR OPERATING MACHINERY FOR 24 HOURS FOLLOWING ANESTHESIA / PAIN MEDICATIONS.  PLEASE MAKE ARRANGEMENTS FOR SOMEONE TO BE WITH YOU AT HOME THE FIRST 24 HOURS AFTER SURGERY. RESPONSIBLE DRIVER'S NAME ALEXANDER Shain-PT'S HUSBAND                                               PHONE #   [phone]                             PLEASE READ OVER ANY  FACT SHEETS THAT YOU WERE GIVEN: MRSA INFORMATION, BLOOD TRANSFUSION INFORMATION, INCENTIVE SPIROMETER INFORMATION. FAILURE TO FOLLOW THESE INSTRUCTIONS MAY RESULT IN THE CANCELLATION OF YOUR SURGERY.   PATIENT SIGNATURE_________________________________

## 2012-08-09 ENCOUNTER — Encounter (HOSPITAL_COMMUNITY): Payer: Self-pay | Admitting: *Deleted

## 2012-08-09 ENCOUNTER — Encounter (HOSPITAL_COMMUNITY): Payer: Self-pay | Admitting: Anesthesiology

## 2012-08-09 ENCOUNTER — Ambulatory Visit (HOSPITAL_COMMUNITY): Payer: BC Managed Care – PPO | Admitting: Anesthesiology

## 2012-08-09 ENCOUNTER — Encounter (HOSPITAL_COMMUNITY)
Admission: RE | Disposition: A | Discharge status: Home / Self Care | Payer: Self-pay | Source: Ambulatory Visit | Attending: General Surgery

## 2012-08-09 ENCOUNTER — Ambulatory Visit (HOSPITAL_COMMUNITY)
Admission: RE | Admit: 2012-08-09 | Discharge: 2012-08-09 | Disposition: A | Discharge status: Home / Self Care | Payer: BC Managed Care – PPO | Source: Ambulatory Visit | Attending: General Surgery | Admitting: General Surgery

## 2012-08-09 DIAGNOSIS — Z853 Personal history of malignant neoplasm of breast: Secondary | ICD-10-CM | POA: Insufficient documentation

## 2012-08-09 DIAGNOSIS — Z01812 Encounter for preprocedural laboratory examination: Secondary | ICD-10-CM | POA: Insufficient documentation

## 2012-08-09 DIAGNOSIS — Z79899 Other long term (current) drug therapy: Secondary | ICD-10-CM | POA: Insufficient documentation

## 2012-08-09 DIAGNOSIS — C210 Malignant neoplasm of anus, unspecified: Secondary | ICD-10-CM

## 2012-08-09 DIAGNOSIS — C44529 Squamous cell carcinoma of skin of other part of trunk: Secondary | ICD-10-CM

## 2012-08-09 DIAGNOSIS — F17200 Nicotine dependence, unspecified, uncomplicated: Secondary | ICD-10-CM | POA: Insufficient documentation

## 2012-08-09 HISTORY — DX: Malignant neoplasm of anus, unspecified: C21.0

## 2012-08-09 HISTORY — PX: EVALUATION UNDER ANESTHESIA WITH ANAL FISTULECTOMY: SHX5621

## 2012-08-09 SURGERY — EXAM UNDER ANESTHESIA WITH ANAL FISTULECTOMY
Anesthesia: General | Site: Anus | Wound class: Contaminated

## 2012-08-09 MED ORDER — LACTATED RINGERS IV SOLN
INTRAVENOUS | Status: DC
  Administered 2012-08-09: 1000 mL via INTRAVENOUS

## 2012-08-09 MED ORDER — ACETAMINOPHEN 325 MG PO TABS
650.0000 mg | ORAL_TABLET | ORAL | Status: DC | PRN
Start: 2012-08-09 — End: 2012-08-09

## 2012-08-09 MED ORDER — OXYCODONE HCL 5 MG PO TABS
5.0000 mg | ORAL_TABLET | ORAL | Status: DC | PRN
Start: 2012-08-09 — End: 2012-08-13

## 2012-08-09 MED ORDER — DEXTROSE 5 % IV SOLN
2.0000 g | INTRAVENOUS | Status: AC
  Administered 2012-08-09: 2 g via INTRAVENOUS

## 2012-08-09 MED ORDER — KETOROLAC TROMETHAMINE 30 MG/ML IJ SOLN
15.0000 mg | Freq: Once | INTRAMUSCULAR | Status: DC | PRN
Start: 2012-08-09 — End: 2012-08-09

## 2012-08-09 MED ORDER — ACETAMINOPHEN 650 MG RE SUPP
650.0000 mg | RECTAL | Status: DC | PRN
  Filled 2012-08-09: qty 1

## 2012-08-09 MED ORDER — DEXAMETHASONE SODIUM PHOSPHATE 10 MG/ML IJ SOLN
INTRAMUSCULAR | Status: DC | PRN
  Administered 2012-08-09: 10 mg via INTRAVENOUS

## 2012-08-09 MED ORDER — ONDANSETRON HCL 4 MG/2ML IJ SOLN: 4.0000 mg | Freq: Four times a day (QID) | INTRAMUSCULAR | Status: DC | PRN

## 2012-08-09 MED ORDER — BUPIVACAINE LIPOSOME 1.3 % IJ SUSP
20.0000 mL | Freq: Once | INTRAMUSCULAR | Status: DC
  Filled 2012-08-09: qty 20

## 2012-08-09 MED ORDER — CEFOXITIN SODIUM-DEXTROSE 1-4 GM-% IV SOLR (PREMIX)
INTRAVENOUS | Status: AC
  Filled 2012-08-09: qty 100

## 2012-08-09 MED ORDER — OXYCODONE HCL 5 MG PO TABS: 5.0000 mg | ORAL_TABLET | ORAL | Status: DC | PRN

## 2012-08-09 MED ORDER — MORPHINE SULFATE 10 MG/ML IJ SOLN: 2.0000 mg | INTRAMUSCULAR | Status: DC | PRN

## 2012-08-09 MED ORDER — BUPIVACAINE LIPOSOME 1.3 % IJ SUSP
INTRAMUSCULAR | Status: DC | PRN
  Administered 2012-08-09: 20 mL

## 2012-08-09 MED ORDER — SODIUM CHLORIDE 0.9 % IJ SOLN: 3.0000 mL | INTRAMUSCULAR | Status: DC | PRN

## 2012-08-09 MED ORDER — LIDOCAINE HCL (CARDIAC) 20 MG/ML IV SOLN
INTRAVENOUS | Status: DC | PRN
  Administered 2012-08-09: 50 mg via INTRAVENOUS

## 2012-08-09 MED ORDER — ONDANSETRON HCL 4 MG/2ML IJ SOLN
INTRAMUSCULAR | Status: DC | PRN
  Administered 2012-08-09: 4 mg via INTRAVENOUS

## 2012-08-09 MED ORDER — ACETAMINOPHEN 10 MG/ML IV SOLN
INTRAVENOUS | Status: DC | PRN
  Administered 2012-08-09: 1000 mg via INTRAVENOUS

## 2012-08-09 MED ORDER — PROPOFOL 10 MG/ML IV BOLUS
INTRAVENOUS | Status: DC | PRN
  Administered 2012-08-09: 200 mg via INTRAVENOUS

## 2012-08-09 MED ORDER — BUPIVACAINE HCL (PF) 0.25 % IJ SOLN
INTRAMUSCULAR | Status: AC
  Filled 2012-08-09: qty 30

## 2012-08-09 MED ORDER — 0.9 % SODIUM CHLORIDE (POUR BTL) OPTIME
TOPICAL | Status: DC | PRN
  Administered 2012-08-09: 1000 mL

## 2012-08-09 MED ORDER — MIDAZOLAM HCL 5 MG/5ML IJ SOLN
INTRAMUSCULAR | Status: DC | PRN
  Administered 2012-08-09 (×2): 2 mg via INTRAVENOUS

## 2012-08-09 MED ORDER — FENTANYL CITRATE 0.05 MG/ML IJ SOLN: 25.0000 ug | INTRAMUSCULAR | Status: DC | PRN

## 2012-08-09 MED ORDER — PROMETHAZINE HCL 25 MG/ML IJ SOLN: 6.2500 mg | INTRAMUSCULAR | Status: DC | PRN

## 2012-08-09 MED ORDER — ACETAMINOPHEN 10 MG/ML IV SOLN
INTRAVENOUS | Status: AC
  Filled 2012-08-09: qty 100

## 2012-08-09 MED ORDER — FENTANYL CITRATE 0.05 MG/ML IJ SOLN
INTRAMUSCULAR | Status: DC | PRN
  Administered 2012-08-09: 50 ug via INTRAVENOUS
  Administered 2012-08-09 (×2): 100 ug via INTRAVENOUS

## 2012-08-09 SURGICAL SUPPLY — 34 items
BLADE HEX COATED 2.75 (ELECTRODE) ×2 IMPLANT
BLADE SURG 15 STRL LF DISP TIS (BLADE) ×1 IMPLANT
BLADE SURG 15 STRL SS (BLADE) ×2
BRIEF STRETCH FOR OB PAD LRG (UNDERPADS AND DIAPERS) ×2 IMPLANT
CANISTER SUCTION 2500CC (MISCELLANEOUS) ×2 IMPLANT
CLOTH BEACON ORANGE TIMEOUT ST (SAFETY) ×2 IMPLANT
DECANTER SPIKE VIAL GLASS SM (MISCELLANEOUS) ×1 IMPLANT
DRSG PAD ABDOMINAL 8X10 ST (GAUZE/BANDAGES/DRESSINGS) ×1 IMPLANT
ELECT REM PT RETURN 9FT ADLT (ELECTROSURGICAL) ×2
ELECTRODE REM PT RTRN 9FT ADLT (ELECTROSURGICAL) ×1 IMPLANT
GAUZE SPONGE 4X4 16PLY XRAY LF (GAUZE/BANDAGES/DRESSINGS) ×2 IMPLANT
GLOVE BIOGEL PI IND STRL 7.0 (GLOVE) ×1 IMPLANT
GLOVE BIOGEL PI INDICATOR 7.0 (GLOVE) ×1
GOWN STRL NON-REIN LRG LVL3 (GOWN DISPOSABLE) ×2 IMPLANT
GOWN STRL REIN XL XLG (GOWN DISPOSABLE) ×5 IMPLANT
KIT BASIN OR (CUSTOM PROCEDURE TRAY) ×2 IMPLANT
LUBRICANT JELLY K Y 4OZ (MISCELLANEOUS) ×2 IMPLANT
NDL HYPO 25X1 1.5 SAFETY (NEEDLE) ×1 IMPLANT
NDL SAFETY ECLIPSE 18X1.5 (NEEDLE) IMPLANT
NEEDLE HYPO 18GX1.5 SHARP (NEEDLE)
NEEDLE HYPO 25X1 1.5 SAFETY (NEEDLE) ×2 IMPLANT
NS IRRIG 1000ML POUR BTL (IV SOLUTION) ×2 IMPLANT
PACK LITHOTOMY IV (CUSTOM PROCEDURE TRAY) ×2 IMPLANT
PENCIL BUTTON HOLSTER BLD 10FT (ELECTRODE) ×2 IMPLANT
SPONGE GAUZE 4X4 12PLY (GAUZE/BANDAGES/DRESSINGS) ×1 IMPLANT
SPONGE HEMORRHOID 8X3CM (HEMOSTASIS) ×1 IMPLANT
SPONGE SURGIFOAM ABS GEL 100 (HEMOSTASIS) IMPLANT
SPONGE SURGIFOAM ABS GEL 12-7 (HEMOSTASIS) IMPLANT
SUT CHROMIC 2 0 SH (SUTURE) IMPLANT
SUT CHROMIC 3 0 SH 27 (SUTURE) IMPLANT
SYR CONTROL 10ML LL (SYRINGE) ×2 IMPLANT
TOWEL OR 17X26 10 PK STRL BLUE (TOWEL DISPOSABLE) ×2 IMPLANT
UNDERPAD 30X30 INCONTINENT (UNDERPADS AND DIAPERS) ×2 IMPLANT
YANKAUER SUCT BULB TIP 10FT TU (MISCELLANEOUS) ×2 IMPLANT

## 2012-08-09 NOTE — Op Note (Signed)
Operative Note  Gina Cervantes female 52 y.o. 08/09/2012  PREOPERATIVE DX:  Anal neoplasm of unknown etiology  POSTOPERATIVE DX:  Same  PROCEDURE:  Examination under anesthesia, anoscopy, multiple biopsies of anal mass         Surgeon: Adolph Pollack   Assistants: None  Anesthesia: General LMA anesthesia  Indications:   This is a 52 year old female with progressively increasing painful mass that is bleeding. On examination in the office she has a firm fixed mass for approximately 3:00 to 5:00 position that extends into the anal canal up to the level of the dentate line. She now presents for the above procedure.    Procedure Detail:  She was seen in the holding area and brought to the operating room placed supine on the operating table and a general anesthetic was given. She was placed in the lithotomy position. The perianal area was sterilely prepped and draped.  Digital rectal examination was performed and the firm mass was noted. As the right side of the anal canal or rectum area felt normal. Anoscopy was performed. A firm mass was visualized going up to the dentate line and extending outside onto the perianal skin. Using electrocautery and a scalpel I took multiple biopsies. I took a biopsy first at the extension of the mass at the dentate line. I then biopsied some of the mass coming out of the anus onto the perianal skin and it took another biopsy of the mass between these 2 areas. All this tissue was sent to pathology. I controlled bleeding from the biopsy site with electrocautery. I then injected long acting Marcaine (Exparel) into the perianal area.  The area was inspected and hemostasis was adequate. A piece of Gelfoam was placed over the biopsy sites. A bulky dry dressing was applied.  She tolerated the procedures well without any apparent complications and was taken to the recovery room in satisfactory condition.  Estimated Blood Loss:  less than 100 mL          Drains: none  Blood Given: none          Specimens: Biopsies of anal mass.        Complications:  * No complications entered in OR log *         Disposition: PACU - hemodynamically stable.         Condition: stable

## 2012-08-09 NOTE — Interval H&P Note (Signed)
History and Physical Interval Note:  08/09/2012 9:58 AM  Gina Cervantes  has presented today for surgery, with the diagnosis of Bleeding hemorrhoids  The various methods of treatment have been discussed with the patient and family. After consideration of risks, benefits and other options for treatment, the patient has consented to  Procedure(s): EXAM UNDER ANESTHESIA AND BIOPSY OF ANAL MASS (N/A) as a surgical intervention .  The patient's history has been reviewed, patient examined, no change in status, stable for surgery.  I have reviewed the patient's chart and labs.  Questions were answered to the patient's satisfaction.     Lillian Tigges Shela Commons

## 2012-08-09 NOTE — Transfer of Care (Signed)
Immediate Anesthesia Transfer of Care Note  Patient: Gina Cervantes  Procedure(s) Performed: Procedure(s): EXAM UNDER ANESTHESIA AND BIOPSY OF ANAL MASS (N/A)  Patient Location: PACU  Anesthesia Type:General  Level of Consciousness: awake and alert   Airway & Oxygen Therapy: Patient Spontanous Breathing and Patient connected to face mask oxygen  Post-op Assessment: Report given to PACU RN and Post -op Vital signs reviewed and stable  Post vital signs: Reviewed and stable  Complications: No apparent anesthesia complications

## 2012-08-09 NOTE — Progress Notes (Signed)
PT up to BR voided x 1,  tolearting po's well.  Voiced understanding of D/C insturctions

## 2012-08-09 NOTE — Anesthesia Preprocedure Evaluation (Signed)
Anesthesia Evaluation  Patient identified by MRN, date of birth, ID band Patient awake    Reviewed: Allergy & Precautions, H&P , NPO status , Patient's Chart, lab work & pertinent test results  Airway Mallampati: II TM Distance: >3 FB Neck ROM: Full    Dental no notable dental hx.    Pulmonary Current Smoker,  breath sounds clear to auscultation  Pulmonary exam normal       Cardiovascular negative cardio ROS  Rhythm:Regular Rate:Normal     Neuro/Psych negative neurological ROS  negative psych ROS   GI/Hepatic negative GI ROS, Neg liver ROS,   Endo/Other  negative endocrine ROS  Renal/GU negative Renal ROS  negative genitourinary   Musculoskeletal negative musculoskeletal ROS (+)   Abdominal   Peds negative pediatric ROS (+)  Hematology negative hematology ROS (+)   Anesthesia Other Findings   Reproductive/Obstetrics negative OB ROS                           Anesthesia Physical Anesthesia Plan  ASA: II  Anesthesia Plan: General   Post-op Pain Management:    Induction: Intravenous  Airway Management Planned: LMA  Additional Equipment:   Intra-op Plan:   Post-operative Plan:   Informed Consent: I have reviewed the patients History and Physical, chart, labs and discussed the procedure including the risks, benefits and alternatives for the proposed anesthesia with the patient or authorized representative who has indicated his/her understanding and acceptance.   Dental advisory given  Plan Discussed with: CRNA and Surgeon  Anesthesia Plan Comments:         Anesthesia Quick Evaluation  

## 2012-08-09 NOTE — H&P (View-Only) (Signed)
Patient ID: Gina Cervantes, female   DOB: 09/15/1960, 51 y.o.   MRN: [id number]  No chief complaint on file.   HPI Gina Cervantes is a 51 y.o. female.   HPI  She is referred by Dr. Mann for evaluation of bleeding hemorrhoids.  She states she's had a known hemorrhoids for some time. Past 6 weeks she's had increased pain and bleeding from it especially following bowel movements. The pain has been fairly constant recently. She has tried over-the-counter medication as well as something prescribed but has had no relief. She's tried warm water tub baths. She told Dr. Mann yesterday and was referred here to be seen for this problem.  Past Medical History  Diagnosis Date  . Breast cancer 2009    Past Surgical History  Procedure Laterality Date  . Breast lumpectomy with axillary lymph node biopsy  2009  . Cesarean section      No family history on file.  Social History History  Substance Use Topics  . Smoking status: Current Some Day Smoker  . Smokeless tobacco: Never Used  . Alcohol Use: 3.0 oz/week    5 Glasses of wine per week    No Known Allergies  Current Outpatient Prescriptions  Medication Sig Dispense Refill  . b complex vitamins tablet Take 1 tablet by mouth daily.      . anastrozole (ARIMIDEX) 1 MG tablet Take 1 tablet (1 mg total) by mouth daily.  90 tablet  12  . calcium-vitamin D 250-100 MG-UNIT per tablet Take 1 tablet by mouth daily.        . Multiple Vitamins-Minerals (MULTIVITAMIN WITH MINERALS) tablet Take 1 tablet by mouth daily.       No current facility-administered medications for this visit.    Review of Systems Review of Systems  Constitutional: Negative.   Respiratory: Negative.   Cardiovascular: Negative.   Gastrointestinal: Positive for anal bleeding and rectal pain. Negative for abdominal pain, diarrhea and constipation.  Genitourinary: Negative.     Blood pressure 98/70, pulse 84, resp. rate 18, height 5' 6" (1.676 m), weight 160 lb 12.8 oz  (72.938 kg).  Physical Exam Physical Exam  Constitutional: She appears well-developed and well-nourished. No distress.  HENT:  Head: Normocephalic and atraumatic.  Cardiovascular: Normal rate and regular rhythm.   Pulmonary/Chest: Effort normal and breath sounds normal.  Abdominal: Soft. She exhibits no distension and no mass. There is no tenderness.  Genitourinary:  There is a firm left-sided perianal mass that extends into the anal canal. There is a fissure that bleeds in the middle of this.    Data Reviewed Notes in Epic.  Assessment    Left-sided perianal mass extending into the anal canal. This has a fissure in the middle. This is suspicious for neoplasm rather than hemorrhoidal disease.     Plan    We'll schedule exam under anesthesia and biopsy of the mass in the operating room. The procedure, rationale, and risks have been discussed with her. Risks include but are not limited to bleeding, infection, anesthesia problems, need for other procedures. She seems to understand this and agrees with the plan. I will prescribe some Xylocaine jelly as needed for comfort.        Eriko Economos J 07/31/2012, 9:00 AM    

## 2012-08-09 NOTE — Anesthesia Postprocedure Evaluation (Signed)
  Anesthesia Post-op Note  Patient: Gina Cervantes  Procedure(s) Performed: Procedure(s) (LRB): EXAM UNDER ANESTHESIA AND BIOPSY OF ANAL MASS (N/A)  Patient Location: PACU  Anesthesia Type: General  Level of Consciousness: awake and alert   Airway and Oxygen Therapy: Patient Spontanous Breathing  Post-op Pain: mild  Post-op Assessment: Post-op Vital signs reviewed, Patient's Cardiovascular Status Stable, Respiratory Function Stable, Patent Airway and No signs of Nausea or vomiting  Last Vitals:  Filed Vitals:   08/09/12 1045  BP: 122/71  Pulse: 88  Temp: 36.8 C  Resp: 8    Post-op Vital Signs: stable   Complications: No apparent anesthesia complications

## 2012-08-12 ENCOUNTER — Encounter (HOSPITAL_COMMUNITY): Payer: Self-pay | Admitting: General Surgery

## 2012-08-12 ENCOUNTER — Telehealth (INDEPENDENT_AMBULATORY_CARE_PROVIDER_SITE_OTHER): Payer: Self-pay | Admitting: General Surgery

## 2012-08-12 NOTE — Telephone Encounter (Signed)
Left message for patient's appt for4/23/14 10:50  Am

## 2012-08-13 ENCOUNTER — Other Ambulatory Visit (INDEPENDENT_AMBULATORY_CARE_PROVIDER_SITE_OTHER): Payer: Self-pay | Admitting: General Surgery

## 2012-08-13 ENCOUNTER — Encounter (INDEPENDENT_AMBULATORY_CARE_PROVIDER_SITE_OTHER): Payer: Self-pay | Admitting: General Surgery

## 2012-08-13 ENCOUNTER — Ambulatory Visit (HOSPITAL_BASED_OUTPATIENT_CLINIC_OR_DEPARTMENT_OTHER): Payer: BC Managed Care – PPO | Admitting: Oncology

## 2012-08-13 VITALS — BP 132/85 | HR 108 | Temp 98.0°F | Resp 18 | Ht 65.5 in | Wt 159.0 lb

## 2012-08-13 DIAGNOSIS — Z853 Personal history of malignant neoplasm of breast: Secondary | ICD-10-CM

## 2012-08-13 DIAGNOSIS — C210 Malignant neoplasm of anus, unspecified: Secondary | ICD-10-CM

## 2012-08-13 DIAGNOSIS — D490 Neoplasm of unspecified behavior of digestive system: Secondary | ICD-10-CM

## 2012-08-13 DIAGNOSIS — F411 Generalized anxiety disorder: Secondary | ICD-10-CM

## 2012-08-13 MED ORDER — LORAZEPAM 0.5 MG PO TABS: 0.5000 mg | ORAL_TABLET | Freq: Four times a day (QID) | ORAL | Status: DC | PRN

## 2012-08-13 NOTE — Progress Notes (Signed)
Met with patient and husband.  Explained role of nurse navigator.  Educational information provided along with contact names and phone numbers.  Referrals made to dietician and SW.  Reviewed upcoming appointments with patient--she is to see Dr. Mitzi Hansen on 08/15/12.  Patient declined further assist at this time.  She was very appreciative of Dr. Truett Perna seeing patient today.

## 2012-08-13 NOTE — Progress Notes (Signed)
Patient ID: Gina Cervantes, female   DOB: 11/05/1960, 52 y.o.   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  This was discussed with her.  Plan on referring her to medical oncology and radiation oncology for further evaluation and treatment.

## 2012-08-13 NOTE — Progress Notes (Signed)
[facility] New Patient Consult   Referring MD: Gina Cervantes 52 y.o.  Gina Cervantes    Reason for Referral: Anal cancer     HPI: She has a history of locally advanced breast cancer treated on the NSABP B41 study and followed by Dr. Donnie Cervantes. She reports "hemorrhoid "discomfort beginning in January of this year. She saw Dr. Loreta Cervantes and reports a hemorrhoid cream did not help. When she saw Dr. Loreta Cervantes for followup last month she was referred to Dr. Abbey Cervantes. On 07/31/2012 he noted a firm left sided perianal mass extending into the anal canal. A "fissure "with bleeding was noted within the mass.  She was taken to an examination under anesthesia on 08/09/2012. A firm mass was visualized going up to the dentate line and extending onto the perianal skin. Multiple biopsies were obtained. The pathology ([id number]) confirmed invasive moderately differentiated squamous cell carcinoma. The tumor cells are strongly and diffusely positive for p16.    Past Medical History  Diagnosis Date  . Breast cancer, ER positive, PR positive, HER-2 positive  2009  . Pain     RECTAL PAIN - PT HAS ANAL MASS--NO BLEEDING  . Anxiety     PANIC ATTACKS  .  Gina Cervantes P2          Past Surgical History  Procedure Laterality Date  . Breast lumpectomy with axillary lymph node biopsy  2010    RIGHT  . Cesarean section    . Portacath placement  2009  . Removal portacath  2011  . Evaluation under anesthesia with anal fistulectomy N/A 08/09/2012    Procedure: EXAM UNDER ANESTHESIA AND BIOPSY OF ANAL MASS;  Surgeon: Gina Pollack, MD;  Location: [facility];  Service: General;  Laterality: N/A;    Family history: Her mother was diagnosed with nasopharyngeal carcinoma in her 24s  Current outpatient prescriptions:anastrozole (ARIMIDEX) 1 MG tablet, Take 1 mg by mouth daily. TAKES AT BEDTIME, Disp: , Rfl: ;  calcium-vitamin D 250-100 MG-UNIT per tablet, Take 1 tablet by mouth daily. , Disp: ,  Rfl: ;  Cholecalciferol (VITAMIN D) 1000 UNITS capsule, Take 1,000 Units by mouth daily., Disp: , Rfl: ;  ibuprofen (ADVIL,MOTRIN) 200 MG tablet, Take 200 mg by mouth every 6 (six) hours as needed for pain. Take before bed, Disp: , Rfl:  Multiple Vitamins-Minerals (MULTIVITAMIN WITH MINERALS) tablet, Take 1 tablet by mouth daily., Disp: , Rfl: ;  LORazepam (ATIVAN) 0.5 MG tablet, Take 1 tablet (0.5 mg total) by mouth every 6 (six) hours as needed for anxiety (or sleep)., Disp: 30 tablet, Rfl: 2  Allergies: No Known Allergies  Social History: She is a Veterinary surgeon. She lives with her husband. She currently smokes cigarettes and has 2-4 alcohol drinks per day. No transfusion history. No risk factor for HIV or hepatitis.   ROS:   Positives include: Hot flashes, anxiety, bleeding at the anus following recent examinations, pain at the anus  A complete ROS was otherwise negative.  Physical Exam:  Blood pressure 132/85, pulse 108, temperature 98 F (36.7 C), temperature source Oral, resp. rate 18, height 5' 5.5" (1.664 m), weight 159 lb (72.122 kg).  HEENT: The right tonsil is larger than the left side. There is a smooth border. Oropharynx without visible mass. Neck without mass. Lungs: Clear bilaterally Cardiac: Regular rate and rhythm Abdomen: No hepatosplenomegaly, nontender, no mass  Vascular: No leg edema Lymph nodes: No cervical, supraclavicular, axillary, or inguinal nodes Neurologic: Alert and oriented, the  motor exam appears grossly intact Skin: No rash Breast: Status post right lumpectomy. No evidence for local tumor recurrence. No mass in either breast. Musculoskeletal: No spine tenderness Rectal: There is firm tissue at the left anal margin extending approximately 2 cm into the anal canal. There is a visible biopsy site at the left anal margin.   LAB:  CBC  Lab Results  Component Value Date   WBC 7.6 08/02/2012   HGB 14.7 08/02/2012   HCT 42.6 08/02/2012   MCV 96.8 08/02/2012    PLT 319 08/02/2012    ANC 5.0, absolute lymphocyte count 1.5 CMP      Component Value Date/Time   NA 140 08/02/2012 0955   NA 139 06/03/2012 1315   K 4.3 08/02/2012 0955   K 4.0 06/03/2012 1315   CL 100 08/02/2012 0955   CL 96* 06/03/2012 1315   CO2 29 08/02/2012 0955   CO2 32* 06/03/2012 1315   GLUCOSE 97 08/02/2012 0955   GLUCOSE 140* 06/03/2012 1315   BUN 9 08/02/2012 0955   BUN 12.9 06/03/2012 1315   CREATININE 0.59 08/02/2012 0955   CREATININE 0.8 06/03/2012 1315   CALCIUM 9.1 08/02/2012 0955   CALCIUM 9.8 06/03/2012 1315   PROT 7.8 08/02/2012 0955   PROT 8.0 06/03/2012 1315   ALBUMIN 3.6 08/02/2012 0955   ALBUMIN 3.6 06/03/2012 1315   AST 18 08/02/2012 0955   AST 17 06/03/2012 1315   ALT 16 08/02/2012 0955   ALT 19 06/03/2012 1315   ALKPHOS 68 08/02/2012 0955   ALKPHOS 75 06/03/2012 1315   BILITOT 0.3 08/02/2012 0955   BILITOT 0.39 06/03/2012 1315   GFRNONAA >90 08/02/2012 0955   GFRAA >90 08/02/2012 0955   CEA-5.5  Radiology: No recent imaging studies    Assessment/Plan:   1. Anal cancer-left anal margin/anal canal mass, status post a biopsy on 08/09/2012 confirming invasive moderately differentiated squamous cell carcinoma,p16 positive.  2. Right breast cancer 2009, ER positive, PR positive, HER-2 positive. She is status post neoadjuvant  AC x4 cycles followed by Taxol/Herceptin. She underwent a right lumpectomy and sentinel lymph node biopsy 09/22/2008 with no evidence of residual invasive carcinoma and 5 negative sentinel lymph nodes. She then completed right breast radiation. She began tamoxifen 12/15/2008 and completed adjuvant Herceptin 05/25/2009. The tamoxifen was discontinued and she was switched to Arimidex in July 2013. 3. Anxiety secondary to the new diagnosis of anal cancer-she was given a prescription for Ativan to use as needed 4. Ongoing tobacco use 5. Enlargement of the right tonsil   Disposition:   Gina Cervantes has been diagnosed with squamous cell carcinoma of the  anus. I discussed the diagnosis, prognosis, and treatment options with Gina Cervantes and her husband. She is scheduled to see Gina Cervantes on 08/15/2012. There is no clinical evidence of metastatic disease.  I recommend concurrent 5-FU/mitomycin-C chemotherapy and radiation. We discussed the potential toxicities associated with this chemotherapy regimen including the chance for nausea/vomiting, mucositis, diarrhea, alopecia, and hematologic toxicity. We discussed the hemolytic uremic syndrome associated with mitomycin-C. She was given reading materials on 5 fluorouracil and mitomycin C. Arrangements will be made for placement of a PICC prior to beginning chemotherapy.  I will contact Gina Cervantes regarding the preferred staging studies. We will likely proceed with a staging PET scan prior to beginning therapy. We have set a tentative treatment date for 08/26/2012. She will return for an office visit and nadir CBC on 09/05/2012.  Gina Cervantes 08/13/2012, 5:52 PM

## 2012-08-14 ENCOUNTER — Telehealth: Payer: Self-pay | Admitting: Oncology

## 2012-08-14 ENCOUNTER — Encounter: Payer: Self-pay | Admitting: Radiation Oncology

## 2012-08-15 ENCOUNTER — Telehealth: Payer: Self-pay | Admitting: *Deleted

## 2012-08-15 ENCOUNTER — Ambulatory Visit
Admission: RE | Admit: 2012-08-15 | Discharge: 2012-08-15 | Disposition: A | Discharge status: Home / Self Care | Payer: BC Managed Care – PPO | Source: Ambulatory Visit | Attending: Radiation Oncology | Admitting: Radiation Oncology

## 2012-08-15 ENCOUNTER — Ambulatory Visit: Payer: BC Managed Care – PPO | Admitting: Nutrition

## 2012-08-15 ENCOUNTER — Encounter: Payer: Self-pay | Admitting: Radiation Oncology

## 2012-08-15 VITALS — BP 115/76 | HR 90 | Temp 98.8°F | Wt 157.6 lb

## 2012-08-15 DIAGNOSIS — Z801 Family history of malignant neoplasm of trachea, bronchus and lung: Secondary | ICD-10-CM | POA: Insufficient documentation

## 2012-08-15 DIAGNOSIS — F17200 Nicotine dependence, unspecified, uncomplicated: Secondary | ICD-10-CM | POA: Insufficient documentation

## 2012-08-15 DIAGNOSIS — Z79899 Other long term (current) drug therapy: Secondary | ICD-10-CM | POA: Insufficient documentation

## 2012-08-15 DIAGNOSIS — Z853 Personal history of malignant neoplasm of breast: Secondary | ICD-10-CM | POA: Insufficient documentation

## 2012-08-15 DIAGNOSIS — Z923 Personal history of irradiation: Secondary | ICD-10-CM | POA: Insufficient documentation

## 2012-08-15 DIAGNOSIS — Z9221 Personal history of antineoplastic chemotherapy: Secondary | ICD-10-CM | POA: Insufficient documentation

## 2012-08-15 DIAGNOSIS — C210 Malignant neoplasm of anus, unspecified: Secondary | ICD-10-CM

## 2012-08-15 DIAGNOSIS — D490 Neoplasm of unspecified behavior of digestive system: Secondary | ICD-10-CM

## 2012-08-15 NOTE — Progress Notes (Signed)
Patient is a 52 year old female diagnosed with anal cancer to receive chemoradiation therapy. She is a patient of Dr. Truett Perna and Dr. Mitzi Hansen.  Past medical history includes ER, PR positive breast cancer diagnosed in October 2009, anxiety, tobacco, and 2-4 drinks of alcohol daily.  Medications include a Arimidex, calcium with vitamin D, Ativan, multivitamin.  Labs were reviewed.  Height: 65.5 inches. Weight: 157.6 pounds. Usual body weight 166 pounds November 2012. BMI: 25.82.  Patient reports recent weight loss due to stress. Her new diagnosis has been difficult. She states she normally eats a healthy diet. She is concerned with how to eat during treatment.  Nutrition diagnosis: Food and nutrition related knowledge deficit related to new diagnosis of anal cancer and associated treatments as evidenced by no prior need for nutrition related information.  Intervention: I educated patient on the importance of smaller, more frequent meals with increased protein to promote weight maintenance throughout treatment. I have discussed strategies for eating if patient develops diarrhea. We've discussed the importance of weight maintenance throughout treatment. I have provided fact sheets and my contact information for questions.  Monitoring, evaluation, goals: Patient will tolerate adequate calories and protein to minimize weight loss throughout treatment and to reduce the chance of treatment breaks.  Next visit: Patient will contact me with questions or concerns. I will attempt followup during chemotherapy.

## 2012-08-15 NOTE — Progress Notes (Signed)
Please see the Nurse Progress Note in the MD Initial Consult Encounter for this patient. 

## 2012-08-15 NOTE — Telephone Encounter (Signed)
CALLED PATIENT TO INFORM OF TEST - SPOKE WITH PATIENT AND SHE IS AWARE OF THIS TEST.

## 2012-08-15 NOTE — Progress Notes (Signed)
Patient and husband here for consultation of newly diagnosed anal cancer.Treated in 2010 for breast cancer by Dr.Bednarz.Patient was referred to Dr.Rosenbower the end of March for assessment of perianal mass which initially presented as rectal discomfort and bleeding.No further bleeding but patient does continue to have some rectal discomfort.Takes only ibuprofen as needed.Patient very anxious.Has prescription for lorazepam which she has not picked up yet.Biopsy of anus positive for invasive moderately differentiated squamous cell carcinoma.

## 2012-08-16 ENCOUNTER — Telehealth: Payer: Self-pay

## 2012-08-16 NOTE — Telephone Encounter (Signed)
Voice mail left with Dr.Sherrill's nurse that patient will not start radiation treatment until 09/02/2012.

## 2012-08-19 ENCOUNTER — Telehealth: Payer: Self-pay | Admitting: Obstetrics & Gynecology

## 2012-08-19 NOTE — Progress Notes (Signed)
Radiation Oncology         [phone] ________________________________  Name: Gina Cervantes MRN: [id number]  Date: 08/15/2012  DOB: Sep 20, 1960  GN:FAOZ,HYQMVH, MD  Abbey Chatters, Jim Desanctis, MD     REFERRING PHYSICIAN: Adolph Pollack, MD   DIAGNOSIS: The primary encounter diagnosis was Anal neoplasm-left side. A diagnosis of Anal cancer was also pertinent to this visit.   HISTORY OF PRESENT ILLNESS::Gina Cervantes is a 52 y.o. female who is seen for an initial consultation visit. The patient has a history of locally advanced right-sided breast cancer. She underwent neoadjuvant chemotherapy followed by a lumpectomy and adjuvant radiotherapy. Radiotherapy was completed in 2010. The patient recently was complaining of some anal/rectal discomfort which was felt to be consistent with hemorrhoids. This began in January of this year. The patient began some cream for this but these symptoms persisted. The patient was referred from GI to see Dr. Abbey Chatters who noted a firm left sided perianal mass which was extending into the anal canal. Some bleeding was noted, associated with a fissure.  In exam under anesthesia was completed on 08/09/2012. A firm mass was visualized extending to the dentate line and extending onto the perianal skin. Multiple biopsies were obtained. These have returned positive for invasive moderately differentiated squamous cell carcinoma. The tumor cells are positive for p16. The patient has been seen by Dr. Truett Perna and medical oncology and at this time is felt to potentially be a good candidate for definitive chemoradiotherapy.   PREVIOUS RADIATION THERAPY: Yes as above, right-sided breast cancer with adjuvant radiotherapy in 2010. This was completed by Dr. Lestine Box in our Department.   PAST MEDICAL HISTORY:  has a past medical history of Breast cancer (2009); Pain; Anxiety; Pneumonia; Anal cancer (08/09/2012); Status post chemotherapy (02/2008); and Radiation (10/21/08-12/08/08).      PAST SURGICAL HISTORY: Past Surgical History  Procedure Laterality Date  . Breast lumpectomy with axillary lymph node biopsy  2010    RIGHT  . Cesarean section    . Portacath placement  2009  . Removal portacath  2011  . Evaluation under anesthesia with anal fistulectomy N/A 08/09/2012    Procedure: EXAM UNDER ANESTHESIA AND BIOPSY OF ANAL MASS;  Surgeon: Adolph Pollack, MD;  Location: [facility];  Service: General;  Laterality: N/A;     FAMILY HISTORY: family history includes Lung cancer in her father.   SOCIAL HISTORY:  reports that she has been smoking Cigarettes.  She has been smoking about 0.00 packs per day for the past 8 years. She has never used smokeless tobacco. She reports that she drinks about 3.0 ounces of alcohol per week. She reports that she does not use illicit drugs.   ALLERGIES: Review of patient's allergies indicates no known allergies.   MEDICATIONS:  Current Outpatient Prescriptions  Medication Sig Dispense Refill  . anastrozole (ARIMIDEX) 1 MG tablet Take 1 mg by mouth daily. TAKES AT BEDTIME      . calcium-vitamin D 250-100 MG-UNIT per tablet Take 1 tablet by mouth daily.       . Cholecalciferol (VITAMIN D) 1000 UNITS capsule Take 1,000 Units by mouth daily.      Marland Kitchen ibuprofen (ADVIL,MOTRIN) 200 MG tablet Take 200 mg by mouth every 6 (six) hours as needed for pain. Take before bed      . Multiple Vitamins-Minerals (MULTIVITAMIN WITH MINERALS) tablet Take 1 tablet by mouth daily.      Marland Kitchen LORazepam (ATIVAN) 0.5 MG tablet Take 1 tablet (0.5 mg total)  by mouth every 6 (six) hours as needed for anxiety (or sleep).  30 tablet  2   No current facility-administered medications for this encounter.     REVIEW OF SYSTEMS:  A 15 point review of systems is documented in the electronic medical record. This was obtained by the nursing staff. However, I reviewed this with the patient to discuss relevant findings and make appropriate changes.  Pertinent items are noted in  HPI.    PHYSICAL EXAM:  weight is 157 lb 9.6 oz (71.487 kg). Her temperature is 98.8 F (37.1 C). Her blood pressure is 115/76 and her pulse is 90. Her oxygen saturation is 99%.   General: Well-developed, in no acute distress HEENT: Normocephalic, atraumatic; oral cavity clear Neck: Supple without any lymphadenopathy Cardiovascular: Regular rate and rhythm Respiratory: Clear to auscultation bilaterally GI: Soft, nontender, normal bowel sounds Extremities: No edema present Neuro: No focal deficits Lymphatics: No inguinal lymphadenopathy present Rectal: A firm mass present within the anal canal on the left with an associated biopsy. This extends into the anal canal, and also stands or distally towards the anal margin   LABORATORY DATA:  Lab Results  Component Value Date   WBC 7.6 08/02/2012   HGB 14.7 08/02/2012   HCT 42.6 08/02/2012   MCV 96.8 08/02/2012   PLT 319 08/02/2012   Lab Results  Component Value Date   NA 140 08/02/2012   K 4.3 08/02/2012   CL 100 08/02/2012   CO2 29 08/02/2012   Lab Results  Component Value Date   ALT 16 08/02/2012   AST 18 08/02/2012   ALKPHOS 68 08/02/2012   BILITOT 0.3 08/02/2012      RADIOGRAPHY: No results found.     IMPRESSION: The patient is presenting with a new diagnosis of squamous cell carcinoma of the anal canal. She is quite anxious about this diagnosis. The patient does have a prescription for Ativan which she is going to fill. History of right-sided breast cancer status post try modality therapy for which she remains clinically NED. The patient is on anti-hormonal treatment.  She is a good candidate for definitive chemoradiotherapy. She has seen medical oncology who plans to proceed with chemotherapy concurrently with the initiation of radiotherapy. I have discussed the details of a potential course of radiotherapy with the patient. We discussed the rationale of this treatment, the expected benefit, and also potential side effects and risks.  All of her questions were answered. I did discuss with her the issue of typically treating anal cancer patient on our tomotherapy unit. However this is going to be down later this month and therefore we will need to work around this. The patient does wish to avoid any significant delay, although additional healing from her biopsy I believe would be helpful for her prior to beginning treatment. I also recommend proceeding with a PET scan for further staging.   PLAN:  -PET scan as soon as possible -Simulation next week such that we can proceed with treatment planning. We'll need to wait for a PET scan prior to completing her treatment planning, although the simulation can be done before this if this works logistically. -We will plan to begin chemoradiotherapy on 09/02/2012 assuming only local/regional disease found on PET scan.    I spent 60 minutes minutes face to face with the patient and more than 50% of that time was spent in counseling and/or coordination of care.    ________________________________   Radene Gunning, MD, PhD

## 2012-08-19 NOTE — Telephone Encounter (Signed)
Patient has AEX sched later this week and has been diagnosed with anal cancer.  To have PET scan tomm and start chemo on 09-02-12.  Patient wonders is still needs AEX (esp since having PET scan).  I advised patient that I felt she should keep AEX and allow GYN eval to make sure all ok from our standpoint, patient agrees but request I double check with Dr Hyacinth Meeker.  Per patient, do not have to call her back if Dr Hyacinth Meeker agrees.

## 2012-08-19 NOTE — Telephone Encounter (Signed)
Patient wants to speak with nurse re: A recent diagnosis that make her wonder if she may not want to be seen by Dr. Hyacinth Meeker 08/21/12/ Please advise. No other details given/[place]

## 2012-08-20 ENCOUNTER — Other Ambulatory Visit: Payer: Self-pay | Admitting: *Deleted

## 2012-08-20 ENCOUNTER — Encounter (HOSPITAL_COMMUNITY)
Admission: RE | Admit: 2012-08-20 | Discharge: 2012-08-20 | Disposition: A | Discharge status: Home / Self Care | Payer: BC Managed Care – PPO | Source: Ambulatory Visit | Attending: Radiation Oncology | Admitting: Radiation Oncology

## 2012-08-20 DIAGNOSIS — D490 Neoplasm of unspecified behavior of digestive system: Secondary | ICD-10-CM | POA: Insufficient documentation

## 2012-08-20 LAB — GLUCOSE, CAPILLARY: Glucose-Capillary: 100 mg/dL — ABNORMAL HIGH (ref 70–99)

## 2012-08-20 MED ORDER — FLUDEOXYGLUCOSE F - 18 (FDG) INJECTION
18.3000 | Freq: Once | INTRAVENOUS | Status: AC | PRN
  Administered 2012-08-20: 18.3 via INTRAVENOUS

## 2012-08-21 ENCOUNTER — Ambulatory Visit (INDEPENDENT_AMBULATORY_CARE_PROVIDER_SITE_OTHER): Payer: BC Managed Care – PPO | Admitting: Obstetrics & Gynecology

## 2012-08-21 ENCOUNTER — Encounter: Payer: Self-pay | Admitting: Obstetrics & Gynecology

## 2012-08-21 ENCOUNTER — Encounter: Payer: Self-pay | Admitting: *Deleted

## 2012-08-21 ENCOUNTER — Ambulatory Visit
Admission: RE | Admit: 2012-08-21 | Discharge: 2012-08-21 | Disposition: A | Discharge status: Home / Self Care | Payer: BC Managed Care – PPO | Source: Ambulatory Visit | Attending: Radiation Oncology | Admitting: Radiation Oncology

## 2012-08-21 VITALS — BP 100/64 | HR 60 | Resp 16 | Ht 65.0 in | Wt 157.0 lb

## 2012-08-21 DIAGNOSIS — Z124 Encounter for screening for malignant neoplasm of cervix: Secondary | ICD-10-CM

## 2012-08-21 DIAGNOSIS — Z01419 Encounter for gynecological examination (general) (routine) without abnormal findings: Secondary | ICD-10-CM | POA: Insufficient documentation

## 2012-08-21 DIAGNOSIS — Z510 Encounter for antineoplastic radiation therapy: Secondary | ICD-10-CM | POA: Insufficient documentation

## 2012-08-21 DIAGNOSIS — Z79899 Other long term (current) drug therapy: Secondary | ICD-10-CM | POA: Insufficient documentation

## 2012-08-21 DIAGNOSIS — Y842 Radiological procedure and radiotherapy as the cause of abnormal reaction of the patient, or of later complication, without mention of misadventure at the time of the procedure: Secondary | ICD-10-CM | POA: Insufficient documentation

## 2012-08-21 DIAGNOSIS — C210 Malignant neoplasm of anus, unspecified: Secondary | ICD-10-CM | POA: Insufficient documentation

## 2012-08-21 DIAGNOSIS — L988 Other specified disorders of the skin and subcutaneous tissue: Secondary | ICD-10-CM | POA: Insufficient documentation

## 2012-08-21 NOTE — Patient Instructions (Signed)

## 2012-08-21 NOTE — Progress Notes (Signed)
52 y.o. [id number] [name] here for annual exam.  Diagnosed with anal carcinoma just this past month with Dr. Loreta Ave.  Had rectal bleeding that she thought was due to hemorrhoid.  She was referred to Dr. Purnell Shoemaker who did a rectal biopsy.  Son died from heroine overdose in 05-22-22.  Brother -in-law died in 07/21/22 from scc of mouth.  She feels like she's just having a "crap year".  Has been to hospice grief counseling which has helped.  Younger son is in [place] in school and she is not telling him about current diagnosis.  Patient's last menstrual period was 04/07/2008.          Sexually active: yes  The current method of family planning is post menopausal status.    Exercising: yes  but not since rectal biopsy done  Health Maintenance: Pap: 08/02/11 MMG: 07/03/11.  Knows due. Colonoscopy:  Scheduled for June BMD:  May 22, 2012. Normal. Every two years while on Arimidex TDaP:  2011 Labs: Labs at [facility] early April for pre-op   reports that she has been smoking Cigarettes.  She has been smoking about 0.00 packs per day for the past 8 years. She has never used smokeless tobacco. She reports that she drinks about 3.0 ounces of alcohol per week. She reports that she does not use illicit drugs.  Past Medical History  Diagnosis Date  . Pain     RECTAL PAIN - PT HAS ANAL MASS--NO BLEEDING  . Anxiety     PANIC ATTACKS  . Pneumonia     NO RECENT PROBLEMS  . Status post chemotherapy 02/2008    Taxol/Herceptin  . Radiation 10/21/08-12/08/08    Right breast 6240 cGy  . HPV (human papilloma virus) anogenital infection     vulvar/ freezing hx  . Breast cancer 2009    ER+PR+HER-2+  . Anal cancer 08/09/2012    Squamous cell  . Ductal carcinoma 02/2008     invasive     Past Surgical History  Procedure Laterality Date  . Breast lumpectomy with axillary lymph node biopsy  2010    RIGHT  . Cesarean section    . Portacath placement  2009  . Removal portacath  2011  . Evaluation under anesthesia with  anal fistulectomy N/A 08/09/2012    Procedure: EXAM UNDER ANESTHESIA AND BIOPSY OF ANAL MASS;  Surgeon: Adolph Pollack, MD;  Location: [facility];  Service: General;  Laterality: N/A;  . Breast surgery    . Breast lumpectomy  09/2008    dr. Carolynne Edouard   . Porta cath    . Portacath placement  03/2008    dr. Carolynne Edouard     Current Outpatient Prescriptions  Medication Sig Dispense Refill  . anastrozole (ARIMIDEX) 1 MG tablet Take 1 mg by mouth daily. TAKES AT BEDTIME      . calcium-vitamin D 250-100 MG-UNIT per tablet Take 1 tablet by mouth daily.       . Cholecalciferol (VITAMIN D) 1000 UNITS capsule Take 1,000 Units by mouth daily.      Marland Kitchen ibuprofen (ADVIL,MOTRIN) 200 MG tablet Take 200 mg by mouth every 6 (six) hours as needed for pain. Take before bed      . LORazepam (ATIVAN) 0.5 MG tablet Take 1 tablet (0.5 mg total) by mouth every 6 (six) hours as needed for anxiety (or sleep).  30 tablet  2  . Multiple Vitamins-Minerals (MULTIVITAMIN WITH MINERALS) tablet Take 1 tablet by mouth daily.       No current facility-administered  medications for this visit.    Family History  Problem Relation Age of Onset  . Lung cancer Father   . Stroke Mother     ROS:  Pertinent items are noted in HPI.  Otherwise, a comprehensive ROS was negative.  Exam:   BP 100/64  Pulse 60  Resp 16  Ht 5\' 5"  (1.651 m)  Wt 157 lb (71.215 kg)  BMI 26.13 kg/m2  LMP 04/07/2008  Height:   Height: 5\' 5"  (165.1 cm)  Ht Readings from Last 3 Encounters:  08/21/12 5\' 5"  (1.651 m)  08/13/12 5' 5.5" (1.664 m)  08/02/12 5' 5.5" (1.664 m)    General appearance: alert, cooperative and appears stated age Head: Normocephalic, without obvious abnormality, atraumatic Neck: no adenopathy, supple, symmetrical, trachea midline and thyroid normal to inspection and palpation Lungs: clear to auscultation bilaterally Breasts: scar on right UOQ with radiation changes, otherwise no masses or skin changes or nipple abnormalities Heart: regular  rate and rhythm Abdomen: soft, non-tender; bowel sounds normal; no masses,  no organomegaly Extremities: extremities normal, atraumatic, no cyanosis or edema Skin: Skin color, texture, turgor normal. No rashes or lesions Lymph nodes: Cervical, supraclavicular, and axillary nodes normal. No abnormal inguinal nodes palpated Neurologic: Grossly normal   Pelvic: External genitalia:  no lesions              Urethra:  normal appearing urethra with no masses, tenderness or lesions              Bartholins and Skenes: normal                 Vagina: normal appearing vagina with normal color and discharge, no lesions              Cervix: no lesions              Pap taken: yes Bimanual Exam:  Uterus:  normal size, contour, position, consistency, mobility, non-tender              Adnexa: no mass, fullness, tenderness               Rectovaginal: not done due to recent rectal biopsy               Anus:  Large biopsy site 5 o'clock, good granulation tissue--healing well  A:  Well Woman with normal exam Recent diagnosis of Anal carcinoma--treatment has not started yet H/O invasive ductal carcinoma of right breast Recent death of son due to heroine overdose  P:   Mammogram yearly pap smear with HR HPV testing BMD every two years while on Arimidex return annually or prn  An After Visit Summary was printed and given to the patient.

## 2012-08-22 ENCOUNTER — Telehealth: Payer: Self-pay | Admitting: Oncology

## 2012-08-22 NOTE — Telephone Encounter (Signed)
Per 4/15 pof moved 4/21 port placement and chemo to 4/28 - moved pump d/c from 4/25 to 5/2 and per desk nurse moved 5/1 lb/fu to 5/9 per BS. Checked w/desk nurse re lb/fu as not instruction was given for lb/fu on 4/15 pof. pof was not sent to scheduling. I obtained pof from the chart as I received a message from the GI coord (4/15) to be on the look out for new orders for this pt. lmonvm for pt re changes and new appt d/t's for port placement 4/28 @ 8:30am to arrive @ 8am - chemo 4/28 @ 10:15am - pump d/c 5/2 @ 3:30pm and lb/fu 5/9 @ 10:30am. Pt to get new schedule when she comes in 4/28 and asked to call me back to confirm.

## 2012-08-23 ENCOUNTER — Telehealth: Payer: Self-pay | Admitting: Oncology

## 2012-08-23 LAB — IPS PAP TEST WITH HPV

## 2012-08-23 NOTE — Telephone Encounter (Signed)
Pt called back and confirmed appts for 4/28, 5/2 and 5/9 - including picc line.

## 2012-08-26 ENCOUNTER — Other Ambulatory Visit (HOSPITAL_COMMUNITY): Payer: BC Managed Care – PPO

## 2012-08-26 ENCOUNTER — Ambulatory Visit: Payer: BC Managed Care – PPO

## 2012-08-28 ENCOUNTER — Ambulatory Visit (INDEPENDENT_AMBULATORY_CARE_PROVIDER_SITE_OTHER): Payer: BC Managed Care – PPO | Admitting: General Surgery

## 2012-08-28 ENCOUNTER — Encounter (INDEPENDENT_AMBULATORY_CARE_PROVIDER_SITE_OTHER): Payer: Self-pay | Admitting: General Surgery

## 2012-08-28 VITALS — BP 104/62 | HR 64 | Temp 97.4°F | Resp 14 | Ht 65.0 in | Wt 158.6 lb

## 2012-08-28 DIAGNOSIS — Z9889 Other specified postprocedural states: Secondary | ICD-10-CM

## 2012-08-28 NOTE — Patient Instructions (Signed)
Activities as tolerated. 

## 2012-08-28 NOTE — Progress Notes (Signed)
Procedure:  EUA, biopsy of anal mass  Date:  08/09/12  Pathology:  Invasive squamous cell carcinoma  History:  She is here for her first postoperative visit and is doing fairly well. She has already been seen by medical oncology and radiation oncology and her treatments are due to start within a week.  Exam: General- Is in NAD. Anorectal-open wound is clean with no bleeding.  Assessment:  Invasive SCC of anus.  Plan:  CRT.  Return visit four months.

## 2012-08-30 ENCOUNTER — Encounter: Payer: BC Managed Care – PPO | Admitting: Nutrition

## 2012-09-01 ENCOUNTER — Other Ambulatory Visit: Payer: Self-pay | Admitting: Oncology

## 2012-09-02 ENCOUNTER — Other Ambulatory Visit: Payer: Self-pay | Admitting: Oncology

## 2012-09-02 ENCOUNTER — Ambulatory Visit
Admission: RE | Admit: 2012-09-02 | Discharge: 2012-09-02 | Disposition: A | Discharge status: Home / Self Care | Payer: BC Managed Care – PPO | Source: Ambulatory Visit | Attending: Radiation Oncology | Admitting: Radiation Oncology

## 2012-09-02 ENCOUNTER — Telehealth: Payer: Self-pay | Admitting: Oncology

## 2012-09-02 ENCOUNTER — Ambulatory Visit (HOSPITAL_COMMUNITY)
Admission: RE | Admit: 2012-09-02 | Discharge: 2012-09-02 | Disposition: A | Discharge status: Home / Self Care | Payer: BC Managed Care – PPO | Source: Ambulatory Visit | Attending: Oncology | Admitting: Oncology

## 2012-09-02 ENCOUNTER — Ambulatory Visit (HOSPITAL_BASED_OUTPATIENT_CLINIC_OR_DEPARTMENT_OTHER): Payer: BC Managed Care – PPO

## 2012-09-02 DIAGNOSIS — C210 Malignant neoplasm of anus, unspecified: Secondary | ICD-10-CM | POA: Insufficient documentation

## 2012-09-02 DIAGNOSIS — D490 Neoplasm of unspecified behavior of digestive system: Secondary | ICD-10-CM

## 2012-09-02 DIAGNOSIS — Z5111 Encounter for antineoplastic chemotherapy: Secondary | ICD-10-CM

## 2012-09-02 MED ORDER — MITOMYCIN CHEMO IV INJECTION 20 MG
10.0000 mg/m2 | Freq: Once | INTRAVENOUS | Status: AC
  Administered 2012-09-02: 18 mg via INTRAVENOUS
  Filled 2012-09-02: qty 36

## 2012-09-02 MED ORDER — SODIUM CHLORIDE 0.9 % IV SOLN
Freq: Once | INTRAVENOUS | Status: AC
  Administered 2012-09-02: 11:00:00 via INTRAVENOUS

## 2012-09-02 MED ORDER — PROCHLORPERAZINE MALEATE 10 MG PO TABS: 10.0000 mg | ORAL_TABLET | Freq: Four times a day (QID) | ORAL | Status: DC | PRN

## 2012-09-02 MED ORDER — DEXAMETHASONE SODIUM PHOSPHATE 10 MG/ML IJ SOLN
10.0000 mg | Freq: Once | INTRAMUSCULAR | Status: AC
  Administered 2012-09-02: 10 mg via INTRAVENOUS

## 2012-09-02 MED ORDER — SODIUM CHLORIDE 0.9 % IV SOLN
1000.0000 mg/m2/d | INTRAVENOUS | Status: DC
  Administered 2012-09-02: 7300 mg via INTRAVENOUS
  Filled 2012-09-02: qty 146

## 2012-09-02 MED ORDER — PALONOSETRON HCL INJECTION 0.25 MG/5ML
0.2500 mg | Freq: Once | INTRAVENOUS | Status: AC
  Administered 2012-09-02: 0.25 mg via INTRAVENOUS

## 2012-09-02 NOTE — Progress Notes (Signed)
First Mitomycin/ 5FU. Drug information reviewed. Continuous infusion pump teaching reviewed with pt and husband. Both verbalize understanding. Pump information sheet, safe handling and spill kit explained and given to pt.

## 2012-09-02 NOTE — Patient Instructions (Signed)
[facility] Discharge Instructions for Patients Receiving Chemotherapy  Today you received the following chemotherapy agents: Mitomycin and 5FU. To help prevent nausea and vomiting after your treatment, we encourage you to take your nausea medication, Compazine.  Take every six hours as needed for nausea. This may make you drowsy.   If you develop nausea and vomiting that is not controlled by your nausea medication, call the clinic. If it is after clinic hours your family physician or the after hours number for the clinic or go to the Emergency Department. For diarrhea: Take Imodium (over the counter). Take 2 tablets with the first loose stool. Take one tablet after each loose stool thereafter. You make take up to 8 tablets daily. If diarrhea persists despite Imodium, call your doctor.    BELOW ARE SYMPTOMS THAT SHOULD BE REPORTED IMMEDIATELY:  *FEVER GREATER THAN 100.5 F  *CHILLS WITH OR WITHOUT FEVER  NAUSEA AND VOMITING THAT IS NOT CONTROLLED WITH YOUR NAUSEA MEDICATION  *UNUSUAL SHORTNESS OF BREATH  *UNUSUAL BRUISING OR BLEEDING  TENDERNESS IN MOUTH AND THROAT WITH OR WITHOUT PRESENCE OF ULCERS  *URINARY PROBLEMS  *BOWEL PROBLEMS  UNUSUAL RASH Items with * indicate a potential emergency and should be followed up as soon as possible.  One of the nurses will contact you 24 hours after your treatment. Please let the nurse know about any problems that you may have experienced. Feel free to call the clinic you have any questions or concerns. The clinic phone number is [phone].   I have been informed and understand all the instructions given to me. I know to contact the clinic, my physician, or go to the Emergency Department if any problems should occur. I do not have any questions at this time, but understand that I may call the clinic during office hours   should I have any questions or need assistance in obtaining follow up  care.    __________________________________________  _____________  __________ Signature of Patient or Authorized Representative            Date                   Time    __________________________________________ Nurse's Signature

## 2012-09-02 NOTE — Procedures (Signed)
Successful placement of left basilic vein approach 41 cm single lumen PICC line with tip at the superior caval-atrial junction.  The PICC line is ready for immediate use.

## 2012-09-03 ENCOUNTER — Telehealth: Payer: Self-pay | Admitting: *Deleted

## 2012-09-03 ENCOUNTER — Other Ambulatory Visit: Payer: Self-pay | Admitting: Certified Registered Nurse Anesthetist

## 2012-09-03 ENCOUNTER — Ambulatory Visit
Admission: RE | Admit: 2012-09-03 | Discharge: 2012-09-03 | Disposition: A | Discharge status: Home / Self Care | Payer: BC Managed Care – PPO | Source: Ambulatory Visit | Attending: Radiation Oncology | Admitting: Radiation Oncology

## 2012-09-03 ENCOUNTER — Ambulatory Visit (HOSPITAL_BASED_OUTPATIENT_CLINIC_OR_DEPARTMENT_OTHER): Payer: BC Managed Care – PPO

## 2012-09-03 DIAGNOSIS — C50919 Malignant neoplasm of unspecified site of unspecified female breast: Secondary | ICD-10-CM

## 2012-09-03 NOTE — Telephone Encounter (Signed)
Message left for chemotherapy f/u.  Awaiting return call from patient.  Asked that she drink 64 oz water and fluids daily.

## 2012-09-03 NOTE — Telephone Encounter (Signed)
Message copied by Augusto Garbe on Tue Sep 03, 2012  5:07 PM ------      Message from: Caleb Popp      Created: Mon Sep 02, 2012  5:11 PM      Regarding: chemo follow up call       Mitomycin +96 hour 5FU pump 4/28 (pt seeing flush nurse 4/29 for new PICC dressing change) GBS. ------

## 2012-09-04 ENCOUNTER — Ambulatory Visit
Admission: RE | Admit: 2012-09-04 | Discharge: 2012-09-04 | Disposition: A | Discharge status: Home / Self Care | Payer: BC Managed Care – PPO | Source: Ambulatory Visit | Attending: Radiation Oncology | Admitting: Radiation Oncology

## 2012-09-05 ENCOUNTER — Ambulatory Visit: Payer: BC Managed Care – PPO | Admitting: Oncology

## 2012-09-05 ENCOUNTER — Other Ambulatory Visit: Payer: BC Managed Care – PPO | Admitting: Lab

## 2012-09-05 ENCOUNTER — Ambulatory Visit
Admission: RE | Admit: 2012-09-05 | Discharge: 2012-09-05 | Disposition: A | Discharge status: Home / Self Care | Payer: BC Managed Care – PPO | Source: Ambulatory Visit | Attending: Radiation Oncology | Admitting: Radiation Oncology

## 2012-09-06 ENCOUNTER — Ambulatory Visit (HOSPITAL_BASED_OUTPATIENT_CLINIC_OR_DEPARTMENT_OTHER): Payer: BC Managed Care – PPO

## 2012-09-06 ENCOUNTER — Ambulatory Visit
Admission: RE | Admit: 2012-09-06 | Discharge: 2012-09-06 | Disposition: A | Discharge status: Home / Self Care | Payer: BC Managed Care – PPO | Source: Ambulatory Visit | Attending: Radiation Oncology | Admitting: Radiation Oncology

## 2012-09-06 VITALS — BP 117/82 | HR 83 | Temp 97.1°F

## 2012-09-06 DIAGNOSIS — C210 Malignant neoplasm of anus, unspecified: Secondary | ICD-10-CM

## 2012-09-06 MED ORDER — SODIUM CHLORIDE 0.9 % IJ SOLN
10.0000 mL | INTRAMUSCULAR | Status: DC | PRN
  Administered 2012-09-06: 10 mL
  Filled 2012-09-06: qty 10

## 2012-09-06 NOTE — Progress Notes (Signed)
   Department of Radiation Oncology  Phone:  [phone] Fax:        [phone]  Weekly Treatment Note    Name: Gina Cervantes Date: 09/06/2012 MRN: [id number] DOB: [birth year]   Current dose: 9 Gy  Current fraction: 5   MEDICATIONS: Current Outpatient Prescriptions  Medication Sig Dispense Refill  . anastrozole (ARIMIDEX) 1 MG tablet Take 1 mg by mouth daily. TAKES AT BEDTIME      . calcium-vitamin D 250-100 MG-UNIT per tablet Take 1 tablet by mouth daily.       . Cholecalciferol (VITAMIN D) 1000 UNITS capsule Take 1,000 Units by mouth daily.      Marland Kitchen ibuprofen (ADVIL,MOTRIN) 200 MG tablet Take 200 mg by mouth every 6 (six) hours as needed for pain. Take before bed      . LORazepam (ATIVAN) 0.5 MG tablet Take 1 tablet (0.5 mg total) by mouth every 6 (six) hours as needed for anxiety (or sleep).  30 tablet  2  . Multiple Vitamins-Minerals (MULTIVITAMIN WITH MINERALS) tablet Take 1 tablet by mouth daily.      . prochlorperazine (COMPAZINE) 10 MG tablet Take 1 tablet (10 mg total) by mouth every 6 (six) hours as needed.  30 tablet  2   No current facility-administered medications for this encounter.     ALLERGIES: Review of patient's allergies indicates no known allergies.   LABORATORY DATA:  Lab Results  Component Value Date   WBC 7.6 08/02/2012   HGB 14.7 08/02/2012   HCT 42.6 08/02/2012   MCV 96.8 08/02/2012   PLT 319 08/02/2012   Lab Results  Component Value Date   NA 140 08/02/2012   K 4.3 08/02/2012   CL 100 08/02/2012   CO2 29 08/02/2012   Lab Results  Component Value Date   ALT 16 08/02/2012   AST 18 08/02/2012   ALKPHOS 68 08/02/2012   BILITOT 0.3 08/02/2012     NARRATIVE: Gina Cervantes was seen today for weekly treatment management. The chart was checked and the patient's films were reviewed. The patient is doing well. No major changes or difficulty so far. The patient had a number of questions which were answered today. We talked about skin care as we go  forward.  PHYSICAL EXAMINATION: vitals were not taken for this visit.     alert, in no acute distress, accompanied by her husband  ASSESSMENT: The patient is doing satisfactorily with treatment.  PLAN: We will continue with the patient's radiation treatment as planned.

## 2012-09-06 NOTE — Progress Notes (Signed)
Abril here today for pump dc and PICC dc.  Telephone call to Dr. Truett Perna to confirm that PICC to come out today.  Verbal order received that since patient does not want to keep [facility] for four weeks ok to pull it. While patient in lying position, sterile technique use to remove PICC line.  Catheter @41cm . Moderate bruising around insertion site. Patient to remain lying flat  x 30 minutes.  Patient has red raised  rash to right side of neck, smaller area left side of neck and small area on chest below right neck.  Dr. Truett Perna updated.  Patient to continue benadry for comfort, stay out of the sun and call if rash gets worse.

## 2012-09-06 NOTE — Patient Instructions (Addendum)
Call MD for problems or concerns.  Leave pressure dressing to left arm x 24 hours.  Benadryl for itching and avoid the sun to rash

## 2012-09-09 ENCOUNTER — Ambulatory Visit
Admission: RE | Admit: 2012-09-09 | Discharge: 2012-09-09 | Disposition: A | Discharge status: Home / Self Care | Payer: BC Managed Care – PPO | Source: Ambulatory Visit | Attending: Radiation Oncology | Admitting: Radiation Oncology

## 2012-09-10 ENCOUNTER — Ambulatory Visit
Admission: RE | Admit: 2012-09-10 | Discharge: 2012-09-10 | Disposition: A | Discharge status: Home / Self Care | Payer: BC Managed Care – PPO | Source: Ambulatory Visit | Attending: Radiation Oncology | Admitting: Radiation Oncology

## 2012-09-10 DIAGNOSIS — C210 Malignant neoplasm of anus, unspecified: Secondary | ICD-10-CM

## 2012-09-10 MED ORDER — BIAFINE EX EMUL
Freq: Two times a day (BID) | CUTANEOUS | Status: DC
  Administered 2012-09-10: 15:00:00 via TOPICAL

## 2012-09-10 NOTE — Progress Notes (Signed)
Ms. Armistead here to complete post sim education.  She was already given the Radiation Therapy and You book.  She stated she had read through the book and did not have any questions.  Today, she was given biafine gel and instructed to begin using it when she experiences irritation in the treatment area.  She was instructed to apply it twice a day with the first application at least 4 hours before her treatment time and the second application at bedtime.  She was advised to contact nursing with any questions or concerns.

## 2012-09-11 ENCOUNTER — Encounter: Payer: Self-pay | Admitting: Oncology

## 2012-09-11 ENCOUNTER — Ambulatory Visit
Admission: RE | Admit: 2012-09-11 | Discharge: 2012-09-11 | Disposition: A | Discharge status: Home / Self Care | Payer: BC Managed Care – PPO | Source: Ambulatory Visit | Attending: Oncology | Admitting: Oncology

## 2012-09-11 ENCOUNTER — Ambulatory Visit
Admission: RE | Admit: 2012-09-11 | Discharge: 2012-09-11 | Disposition: A | Discharge status: Home / Self Care | Payer: BC Managed Care – PPO | Source: Ambulatory Visit | Attending: Radiation Oncology | Admitting: Radiation Oncology

## 2012-09-11 DIAGNOSIS — Z853 Personal history of malignant neoplasm of breast: Secondary | ICD-10-CM

## 2012-09-11 DIAGNOSIS — Z9889 Other specified postprocedural states: Secondary | ICD-10-CM

## 2012-09-11 NOTE — Addendum Note (Signed)
Encounter addended by: Eduardo Osier, RN on: 09/11/2012  2:04 PM<BR>     Documentation filed: Notes Section

## 2012-09-11 NOTE — Progress Notes (Signed)
Late entry - advised patient not to use mederma cream until after treatment.  Told her biafine cream is better to use during radiation.

## 2012-09-11 NOTE — Progress Notes (Signed)
I called and left a message for the patient. I need income for her hubby and if the grandkids are her dependants.

## 2012-09-12 ENCOUNTER — Ambulatory Visit
Admission: RE | Admit: 2012-09-12 | Discharge: 2012-09-12 | Disposition: A | Discharge status: Home / Self Care | Payer: BC Managed Care – PPO | Source: Ambulatory Visit | Attending: Radiation Oncology | Admitting: Radiation Oncology

## 2012-09-13 ENCOUNTER — Ambulatory Visit
Admission: RE | Admit: 2012-09-13 | Discharge: 2012-09-13 | Disposition: A | Discharge status: Home / Self Care | Payer: BC Managed Care – PPO | Source: Ambulatory Visit | Attending: Radiation Oncology | Admitting: Radiation Oncology

## 2012-09-13 ENCOUNTER — Other Ambulatory Visit (HOSPITAL_BASED_OUTPATIENT_CLINIC_OR_DEPARTMENT_OTHER): Payer: BC Managed Care – PPO | Admitting: Lab

## 2012-09-13 ENCOUNTER — Ambulatory Visit (HOSPITAL_BASED_OUTPATIENT_CLINIC_OR_DEPARTMENT_OTHER): Payer: BC Managed Care – PPO | Admitting: Oncology

## 2012-09-13 ENCOUNTER — Telehealth: Payer: Self-pay | Admitting: Oncology

## 2012-09-13 VITALS — BP 108/66 | HR 87 | Temp 98.4°F | Ht 65.0 in | Wt 156.0 lb

## 2012-09-13 VITALS — BP 112/64 | HR 83 | Temp 97.5°F | Resp 19 | Ht 65.0 in | Wt 155.9 lb

## 2012-09-13 DIAGNOSIS — F411 Generalized anxiety disorder: Secondary | ICD-10-CM

## 2012-09-13 DIAGNOSIS — F17200 Nicotine dependence, unspecified, uncomplicated: Secondary | ICD-10-CM

## 2012-09-13 DIAGNOSIS — D490 Neoplasm of unspecified behavior of digestive system: Secondary | ICD-10-CM

## 2012-09-13 DIAGNOSIS — C210 Malignant neoplasm of anus, unspecified: Secondary | ICD-10-CM

## 2012-09-13 DIAGNOSIS — C50419 Malignant neoplasm of upper-outer quadrant of unspecified female breast: Secondary | ICD-10-CM

## 2012-09-13 LAB — COMPREHENSIVE METABOLIC PANEL (CC13)
ALT: 13 U/L (ref 0–55)
AST: 14 U/L (ref 5–34)
Albumin: 3.2 g/dL — ABNORMAL LOW (ref 3.5–5.0)
Alkaline Phosphatase: 62 U/L (ref 40–150)
BUN: 14.5 mg/dL (ref 7.0–26.0)
CO2: 28 mEq/L (ref 22–29)
Calcium: 9.1 mg/dL (ref 8.4–10.4)
Chloride: 99 mEq/L (ref 98–107)
Creatinine: 0.7 mg/dL (ref 0.6–1.1)
Glucose: 105 mg/dl — ABNORMAL HIGH (ref 70–99)
Potassium: 4.3 mEq/L (ref 3.5–5.1)
Sodium: 136 mEq/L (ref 136–145)
Total Bilirubin: 0.38 mg/dL (ref 0.20–1.20)
Total Protein: 7.2 g/dL (ref 6.4–8.3)

## 2012-09-13 LAB — CBC WITH DIFFERENTIAL/PLATELET
BASO%: 0.5 % (ref 0.0–2.0)
Basophils Absolute: 0 10*3/uL (ref 0.0–0.1)
EOS%: 5.2 % (ref 0.0–7.0)
Eosinophils Absolute: 0.2 10*3/uL (ref 0.0–0.5)
HCT: 37.5 % (ref 34.8–46.6)
HGB: 12.6 g/dL (ref 11.6–15.9)
LYMPH%: 16.7 % (ref 14.0–49.7)
MCH: 32.5 pg (ref 25.1–34.0)
MCHC: 33.5 g/dL (ref 31.5–36.0)
MCV: 97.1 fL (ref 79.5–101.0)
MONO#: 0.6 10*3/uL (ref 0.1–0.9)
MONO%: 13.5 % (ref 0.0–14.0)
NEUT#: 2.6 10*3/uL (ref 1.5–6.5)
NEUT%: 64.1 % (ref 38.4–76.8)
Platelets: 218 10*3/uL (ref 145–400)
RBC: 3.87 10*6/uL (ref 3.70–5.45)
RDW: 12.6 % (ref 11.2–14.5)
WBC: 4.1 10*3/uL (ref 3.9–10.3)
lymph#: 0.7 10*3/uL — ABNORMAL LOW (ref 0.9–3.3)

## 2012-09-13 NOTE — Progress Notes (Addendum)
Ms. Sonnier here for weekly under treat visit.  She has had 5/23 fractions to her pelvis.  She does have pain in her rectal area that she rates at a 4/10.  She states she has a hemorrhoid that Dr. Alcide Evener found on exam that may be causing the pain.  She is feeling fatigued today.  She denies changes in her bladder and blood in her stools or urine.  She has a nausea occasionally.  She also has ordered some miaderm cream that she would like to start using.

## 2012-09-13 NOTE — Progress Notes (Signed)
   [facility]    OFFICE PROGRESS NOTE   INTERVAL HISTORY:   She completed a first cycle of mitomycin-C and 5-fluorouracil on 09/02/2012. She continues daily radiation. She developed "sensitivity "in the mouth without discrete ulcers. She had a rash at the upper chest and neck beginning on day 4-5 of chemotherapy. The rash resolved with a steroid cream. No significant diarrhea. She continues to have discomfort at the perianal biopsy site. She developed malaise immediately following chemotherapy. This has improved.  Objective:  Vital signs in last 24 hours:  Blood pressure 112/64, pulse 83, temperature 97.5 F (36.4 C), temperature source Oral, resp. rate 19, height 5\' 5"  (1.651 m), weight 155 lb 14.4 oz (70.716 kg), last menstrual period 04/07/2008.    HEENT: No thrush or ulcers Resp: Lungs clear bilaterally Cardio: Regular rate and rhythm GI: No hepatomegaly, nontender Vascular: No leg edema  Skin: No rash at the upper chest or neck. Minimal erythema at the perineum. Biopsy site at the left anal verge remains open with pink granulation tissue and an adjacent hemorrhoid     Lab Results:  Lab Results  Component Value Date   WBC 4.1 09/13/2012   HGB 12.6 09/13/2012   HCT 37.5 09/13/2012   MCV 97.1 09/13/2012   PLT 218 09/13/2012   ANC 2.6    Medications: I have reviewed the patient's current medications.  Assessment/Plan: 1.Anal cancer-left anal margin/anal canal mass, status post a biopsy on 08/09/2012 confirming invasive moderately differentiated squamous cell carcinoma,p16 positive.  -Staging PET scan 08/20/2012-hypermetabolic anal mass, no hypermetabolic metastases -Initiation of concurrent radiation with cycle 1 of mitomycin C./5-fluorouracil 09/02/2012 2. Right breast cancer 2009, ER positive, PR positive, HER-2 positive. She is status post neoadjuvant AC x4 cycles followed by Taxol/Herceptin. She underwent a right lumpectomy and sentinel lymph node biopsy  09/22/2008 with no evidence of residual invasive carcinoma and 5 negative sentinel lymph nodes. She then completed right breast radiation. She began tamoxifen 12/15/2008 and completed adjuvant Herceptin 05/25/2009. The tamoxifen was discontinued and she was switched to Arimidex in July 2013.  3. Anxiety secondary to the new diagnosis of anal cancer-she was given a prescription for Ativan to use as needed  4. Ongoing tobacco use  5. Enlargement of the right tonsil 6. Nonspecific pulmonary nodules without hypermetabolic activity on the PET scan 08/20/2012    Disposition:  She tolerated the first cycle of chemotherapy well. She continues daily radiation. We will check a CBC in approximately one week. Ms. Moilanen will return for an office visit and cycle 2 of chemotherapy on 10/01/2012.   Thornton Papas, MD  09/13/2012  6:28 PM

## 2012-09-13 NOTE — Progress Notes (Signed)
   Department of Radiation Oncology  Phone:  [phone] Fax:        [phone]  Weekly Treatment Note    Name: Gina Cervantes Date: 09/13/2012 MRN: [id number] DOB: [birth year]   Current dose: 9 Gy  Current fraction: 10   MEDICATIONS: Current Outpatient Prescriptions  Medication Sig Dispense Refill  . anastrozole (ARIMIDEX) 1 MG tablet Take 1 mg by mouth daily. TAKES AT BEDTIME      . calcium-vitamin D 250-100 MG-UNIT per tablet Take 1 tablet by mouth daily.       . Cholecalciferol (VITAMIN D) 1000 UNITS capsule Take 1,000 Units by mouth daily.      Marland Kitchen ibuprofen (ADVIL,MOTRIN) 200 MG tablet Take 200 mg by mouth every 6 (six) hours as needed for pain. Take before bed      . LORazepam (ATIVAN) 0.5 MG tablet Take 1 tablet (0.5 mg total) by mouth every 6 (six) hours as needed for anxiety (or sleep).  30 tablet  2  . Multiple Vitamins-Minerals (MULTIVITAMIN WITH MINERALS) tablet Take 1 tablet by mouth daily.      . prochlorperazine (COMPAZINE) 10 MG tablet Take 1 tablet (10 mg total) by mouth every 6 (six) hours as needed.  30 tablet  2   No current facility-administered medications for this encounter.     ALLERGIES: Review of patient's allergies indicates no known allergies.   LABORATORY DATA:  Lab Results  Component Value Date   WBC 4.1 09/13/2012   HGB 12.6 09/13/2012   HCT 37.5 09/13/2012   MCV 97.1 09/13/2012   PLT 218 09/13/2012   Lab Results  Component Value Date   NA 136 09/13/2012   K 4.3 09/13/2012   CL 99 09/13/2012   CO2 28 09/13/2012   Lab Results  Component Value Date   ALT 13 09/13/2012   AST 14 09/13/2012   ALKPHOS 62 09/13/2012   BILITOT 0.38 09/13/2012     NARRATIVE: Gina Cervantes was seen today for weekly treatment management. The chart was checked and the patient's films were reviewed. The patient states that she is fairly well. She was seen by Dr. Truett Perna today and examined. Some continued pain in the rectal area. No major changes in her scan.  PHYSICAL  EXAMINATION: height is 5\' 5"  (1.651 m) and weight is 156 lb (70.761 kg). Her temperature is 98.4 F (36.9 C). Her blood pressure is 108/66 and her pulse is 87.        ASSESSMENT: The patient is doing satisfactorily with treatment.  PLAN: We will continue with the patient's radiation treatment as planned. The patient has begun using skin cream and also is taking some baths, lukewarm.

## 2012-09-13 NOTE — Telephone Encounter (Signed)
Pt came to scheduling for appts to be scheduled and stated she couldn't stop earlier and came back for appts. While scheduling pt had to leave again and per pt she will come for whatever time we schedule and will check mychart for appts. S/w Inetta Fermo in IR and per Inetta Fermo the only time she can place the picc on 5/27 is 12:30pm and pt will need to arrive 12pm. On 5/27 pt will have lb 10:15am - BC 10:45am and go to [facility] @ 12pm for picc placement. tx scheduled for 1:30pm. lmonvm for pt re set up for appts on 5/27. Pump stop will be Saturday 5/31

## 2012-09-16 ENCOUNTER — Ambulatory Visit
Admission: RE | Admit: 2012-09-16 | Discharge: 2012-09-16 | Disposition: A | Discharge status: Home / Self Care | Payer: BC Managed Care – PPO | Source: Ambulatory Visit | Attending: Radiation Oncology | Admitting: Radiation Oncology

## 2012-09-16 NOTE — Progress Notes (Signed)
  Radiation Oncology         [phone] ________________________________  Name: Gina Cervantes MRN: [id number]  Date: 08/21/2012  DOB: 09-Jul-1960  SIMULATION AND TREATMENT PLANNING NOTE   CONSENT VERIFIED: yes   SET UP: Patient is set-up supine   IMMOBILIZATION: The following immobilization is used: alpha-cradle. This complex treatment device will be used on a daily basis during the patient's treatment.   Diagnosis: Anal cancer   NARRATIVE: The patient was brought to the CT Simulation planning suite. Identity was confirmed. All relevant records and images related to the planned course of therapy were reviewed. Then, the patient was positioned in a stable reproducible clinical set-up for radiation therapy using an aquaplast mask. Skin markings were placed. The CT images were loaded into the planning software where the target and avoidance structures were contoured.The radiation prescription was entered and confirmed.   The patient will receive 50.4 Gy in 28 fractions to the high-dose target region.  Daily image guidance is ordered, and this will be used on a daily basis. This is necessary to ensure accurate and precise localization of the target in addition to accurate alignment of the normal tissue structures in this region. This is particularly important given the possible motion of the high-dose target.  Treatment planning then occurred.   I have requested : Intensity Modulated Radiotherapy (IMRT) is medically necessary for this case for the following reason: Dose homogeneity; the target is in close proximity to critical normal structures, including the femoral heads, bladder, and small bowel. IMRT is thus medically to appropriately treat the patient.   Special treatment procedure  The patient will receive chemotherapy during the course of radiation treatment. The patient may experience increased or overlapping toxicity due to this combined-modality approach and the patient will be  monitored for such problems. This may include extra lab work as necessary. This therefore constitutes a special treatment procedure.     ________________________________  Radene Gunning, MD, PhD

## 2012-09-17 ENCOUNTER — Ambulatory Visit
Admission: RE | Admit: 2012-09-17 | Discharge: 2012-09-17 | Disposition: A | Discharge status: Home / Self Care | Payer: BC Managed Care – PPO | Source: Ambulatory Visit | Attending: Radiation Oncology | Admitting: Radiation Oncology

## 2012-09-18 ENCOUNTER — Ambulatory Visit
Admission: RE | Admit: 2012-09-18 | Discharge: 2012-09-18 | Disposition: A | Discharge status: Home / Self Care | Payer: BC Managed Care – PPO | Source: Ambulatory Visit | Attending: Radiation Oncology | Admitting: Radiation Oncology

## 2012-09-19 ENCOUNTER — Ambulatory Visit
Admission: RE | Admit: 2012-09-19 | Discharge: 2012-09-19 | Disposition: A | Discharge status: Home / Self Care | Payer: BC Managed Care – PPO | Source: Ambulatory Visit | Attending: Radiation Oncology | Admitting: Radiation Oncology

## 2012-09-20 ENCOUNTER — Encounter: Payer: Self-pay | Admitting: Radiation Oncology

## 2012-09-20 ENCOUNTER — Ambulatory Visit
Admission: RE | Admit: 2012-09-20 | Discharge: 2012-09-20 | Disposition: A | Discharge status: Home / Self Care | Payer: BC Managed Care – PPO | Source: Ambulatory Visit | Attending: Radiation Oncology | Admitting: Radiation Oncology

## 2012-09-20 VITALS — BP 116/74 | HR 106 | Temp 98.1°F | Resp 20 | Wt 154.0 lb

## 2012-09-20 DIAGNOSIS — C210 Malignant neoplasm of anus, unspecified: Secondary | ICD-10-CM

## 2012-09-20 NOTE — Progress Notes (Addendum)
pateint here for weekly rad tx 15/28 completed  Pelvis/anal, alert oriented x3, pain a 3 on 1-10 scale, went to the beach last wek, using sunscreen as directed, tanning bottom and front groin  B/L areas, using sitz baths 4x day, cool ness from blow dryer afterwards,  In better spirits, appetite decent , drinks plenty water, 2 bm's today soft stools, using Miaderm  Cream okay per MD 1:59 PM

## 2012-09-20 NOTE — Progress Notes (Signed)
   Department of Radiation Oncology  Phone:  [phone] Fax:        [phone]  Weekly Treatment Note    Name: [name] Date: 09/20/2012 MRN: [id number] DOB: Jan 15, 1961   Current dose: 27 Gy  Current fraction: 15   MEDICATIONS: Current Outpatient Prescriptions  Medication Sig Dispense Refill  . anastrozole (ARIMIDEX) 1 MG tablet Take 1 mg by mouth daily. TAKES AT BEDTIME      . calcium-vitamin D 250-100 MG-UNIT per tablet Take 1 tablet by mouth daily.       . Cholecalciferol (VITAMIN D) 1000 UNITS capsule Take 1,000 Units by mouth daily.      Marland Kitchen ibuprofen (ADVIL,MOTRIN) 200 MG tablet Take 200 mg by mouth every 6 (six) hours as needed for pain. Take before bed      . LORazepam (ATIVAN) 0.5 MG tablet Take 1 tablet (0.5 mg total) by mouth every 6 (six) hours as needed for anxiety (or sleep).  30 tablet  2  . Multiple Vitamins-Minerals (MULTIVITAMIN WITH MINERALS) tablet Take 1 tablet by mouth daily.      . prochlorperazine (COMPAZINE) 10 MG tablet Take 1 tablet (10 mg total) by mouth every 6 (six) hours as needed.  30 tablet  2   No current facility-administered medications for this encounter.     ALLERGIES: Review of patient's allergies indicates no known allergies.   LABORATORY DATA:  Lab Results  Component Value Date   WBC 4.1 09/13/2012   HGB 12.6 09/13/2012   HCT 37.5 09/13/2012   MCV 97.1 09/13/2012   PLT 218 09/13/2012   Lab Results  Component Value Date   NA 136 09/13/2012   K 4.3 09/13/2012   CL 99 09/13/2012   CO2 28 09/13/2012   Lab Results  Component Value Date   ALT 13 09/13/2012   AST 14 09/13/2012   ALKPHOS 62 09/13/2012   BILITOT 0.38 09/13/2012     NARRATIVE: Gina Cervantes was seen today for weekly treatment management. The chart was checked and the patient's films were reviewed. The patient states that she is feeling better this week. She had a good time at the beach. Some skin irritation with a lot of itching. She is continuing to use skin cream daily  and also will be using sitz baths.  PHYSICAL EXAMINATION: weight is 154 lb (69.854 kg). Her oral temperature is 98.1 F (36.7 C). Her blood pressure is 116/74 and her pulse is 106. Her respiration is 20.        ASSESSMENT: The patient is doing satisfactorily with treatment.  PLAN: We will continue with the patient's radiation treatment as planned. She will also use hydrocortisone cream on the areas of greatest itching in the inguinal region.

## 2012-09-23 ENCOUNTER — Ambulatory Visit
Admission: RE | Admit: 2012-09-23 | Discharge: 2012-09-23 | Disposition: A | Discharge status: Home / Self Care | Payer: BC Managed Care – PPO | Source: Ambulatory Visit | Attending: Radiation Oncology | Admitting: Radiation Oncology

## 2012-09-24 ENCOUNTER — Ambulatory Visit
Admission: RE | Admit: 2012-09-24 | Discharge: 2012-09-24 | Disposition: A | Discharge status: Home / Self Care | Payer: BC Managed Care – PPO | Source: Ambulatory Visit | Attending: Radiation Oncology | Admitting: Radiation Oncology

## 2012-09-25 ENCOUNTER — Ambulatory Visit: Payer: BC Managed Care – PPO | Admitting: Oncology

## 2012-09-25 ENCOUNTER — Ambulatory Visit
Admission: RE | Admit: 2012-09-25 | Discharge: 2012-09-25 | Disposition: A | Discharge status: Home / Self Care | Payer: BC Managed Care – PPO | Source: Ambulatory Visit | Attending: Radiation Oncology | Admitting: Radiation Oncology

## 2012-09-25 ENCOUNTER — Other Ambulatory Visit: Payer: BC Managed Care – PPO | Admitting: Lab

## 2012-09-26 ENCOUNTER — Encounter: Payer: Self-pay | Admitting: Radiation Oncology

## 2012-09-26 ENCOUNTER — Ambulatory Visit
Admission: RE | Admit: 2012-09-26 | Discharge: 2012-09-26 | Disposition: A | Discharge status: Home / Self Care | Payer: BC Managed Care – PPO | Source: Ambulatory Visit | Attending: Radiation Oncology | Admitting: Radiation Oncology

## 2012-09-26 VITALS — BP 94/69 | HR 93 | Temp 98.5°F | Resp 20 | Wt 155.6 lb

## 2012-09-26 DIAGNOSIS — C210 Malignant neoplasm of anus, unspecified: Secondary | ICD-10-CM

## 2012-09-26 NOTE — Progress Notes (Signed)
Pt denies pain, nausea, diarrhea, dysuria, loss of appetite. She is fatigued, states she needs break from radiation tx tomorrow. She states after urinating this morning she had some bleeding. She uses water bottle while urinating to get relief of burning of skin w/urine contact. She is applying "approved" lotion to treatment area, using sitz bath 4 x daily. Advised she apply Aquafor to area of greatest skin irritation in groin after treatment. She is using Hydrocortisone cream for itching.

## 2012-09-26 NOTE — Progress Notes (Signed)
   Department of Radiation Oncology  Phone:  [phone] Fax:        [phone]  Weekly Treatment Note    Name: Gina Cervantes Date: 09/26/2012 MRN: [id number] DOB: August 04, 1960   Current dose: 34.2 Gy  Current fraction: 19   MEDICATIONS: Current Outpatient Prescriptions  Medication Sig Dispense Refill  . anastrozole (ARIMIDEX) 1 MG tablet Take 1 mg by mouth daily. TAKES AT BEDTIME      . calcium-vitamin D 250-100 MG-UNIT per tablet Take 1 tablet by mouth daily.       . Cholecalciferol (VITAMIN D) 1000 UNITS capsule Take 1,000 Units by mouth daily.      Marland Kitchen ibuprofen (ADVIL,MOTRIN) 200 MG tablet Take 200 mg by mouth every 6 (six) hours as needed for pain. Take before bed      . LORazepam (ATIVAN) 0.5 MG tablet Take 1 tablet (0.5 mg total) by mouth every 6 (six) hours as needed for anxiety (or sleep).  30 tablet  2  . Multiple Vitamins-Minerals (MULTIVITAMIN WITH MINERALS) tablet Take 1 tablet by mouth daily.      . prochlorperazine (COMPAZINE) 10 MG tablet Take 1 tablet (10 mg total) by mouth every 6 (six) hours as needed.  30 tablet  2   No current facility-administered medications for this encounter.     ALLERGIES: Review of patient's allergies indicates no known allergies.   LABORATORY DATA:  Lab Results  Component Value Date   WBC 4.1 09/13/2012   HGB 12.6 09/13/2012   HCT 37.5 09/13/2012   MCV 97.1 09/13/2012   PLT 218 09/13/2012   Lab Results  Component Value Date   NA 136 09/13/2012   K 4.3 09/13/2012   CL 99 09/13/2012   CO2 28 09/13/2012   Lab Results  Component Value Date   ALT 13 09/13/2012   AST 14 09/13/2012   ALKPHOS 62 09/13/2012   BILITOT 0.38 09/13/2012     NARRATIVE: Gina Cervantes was seen today for weekly treatment management. The chart was checked and the patient's films were reviewed. The patient is having some ongoing irritation. She states that she feels relatively well overall however. Some irritation with urination. She is going to miss tomorrow while  she is at the beach and she will resume her treatment next Tuesday.  We discussed potentially doing a twice a day treatment that final week so she finishes on a Thursday, as she does not want a treatment on her birthday which as it is now would be on her last day.   PHYSICAL EXAMINATION: weight is 155 lb 9.6 oz (70.58 kg). Her oral temperature is 98.5 F (36.9 C). Her blood pressure is 94/69 and her pulse is 93. Her respiration is 20.        ASSESSMENT: The patient is doing satisfactorily with treatment.  PLAN: We will continue with the patient's radiation treatment as planned.

## 2012-09-27 ENCOUNTER — Ambulatory Visit: Payer: BC Managed Care – PPO

## 2012-09-29 ENCOUNTER — Other Ambulatory Visit: Payer: Self-pay | Admitting: Oncology

## 2012-10-01 ENCOUNTER — Telehealth: Payer: Self-pay | Admitting: *Deleted

## 2012-10-01 ENCOUNTER — Telehealth: Payer: Self-pay | Admitting: Oncology

## 2012-10-01 ENCOUNTER — Other Ambulatory Visit: Payer: Self-pay | Admitting: Oncology

## 2012-10-01 ENCOUNTER — Ambulatory Visit (HOSPITAL_BASED_OUTPATIENT_CLINIC_OR_DEPARTMENT_OTHER): Payer: BC Managed Care – PPO

## 2012-10-01 ENCOUNTER — Ambulatory Visit
Admission: RE | Admit: 2012-10-01 | Discharge: 2012-10-01 | Disposition: A | Discharge status: Home / Self Care | Payer: BC Managed Care – PPO | Source: Ambulatory Visit | Attending: Radiation Oncology | Admitting: Radiation Oncology

## 2012-10-01 ENCOUNTER — Ambulatory Visit (HOSPITAL_COMMUNITY)
Admission: RE | Admit: 2012-10-01 | Discharge: 2012-10-01 | Disposition: A | Discharge status: Home / Self Care | Payer: BC Managed Care – PPO | Source: Ambulatory Visit | Attending: Oncology | Admitting: Oncology

## 2012-10-01 ENCOUNTER — Ambulatory Visit (HOSPITAL_BASED_OUTPATIENT_CLINIC_OR_DEPARTMENT_OTHER): Payer: BC Managed Care – PPO | Admitting: Oncology

## 2012-10-01 ENCOUNTER — Other Ambulatory Visit (HOSPITAL_BASED_OUTPATIENT_CLINIC_OR_DEPARTMENT_OTHER): Payer: BC Managed Care – PPO | Admitting: Lab

## 2012-10-01 VITALS — BP 92/58 | HR 78

## 2012-10-01 VITALS — BP 106/71 | HR 90 | Temp 98.3°F | Resp 18 | Ht 65.0 in | Wt 156.6 lb

## 2012-10-01 DIAGNOSIS — C50419 Malignant neoplasm of upper-outer quadrant of unspecified female breast: Secondary | ICD-10-CM

## 2012-10-01 DIAGNOSIS — F411 Generalized anxiety disorder: Secondary | ICD-10-CM

## 2012-10-01 DIAGNOSIS — C210 Malignant neoplasm of anus, unspecified: Secondary | ICD-10-CM

## 2012-10-01 DIAGNOSIS — C50911 Malignant neoplasm of unspecified site of right female breast: Secondary | ICD-10-CM

## 2012-10-01 DIAGNOSIS — Z5111 Encounter for antineoplastic chemotherapy: Secondary | ICD-10-CM

## 2012-10-01 DIAGNOSIS — R918 Other nonspecific abnormal finding of lung field: Secondary | ICD-10-CM

## 2012-10-01 LAB — CBC WITH DIFFERENTIAL/PLATELET
BASO%: 0.5 % (ref 0.0–2.0)
Basophils Absolute: 0 10*3/uL (ref 0.0–0.1)
EOS%: 10.3 % — ABNORMAL HIGH (ref 0.0–7.0)
Eosinophils Absolute: 0.6 10*3/uL — ABNORMAL HIGH (ref 0.0–0.5)
HCT: 38.2 % (ref 34.8–46.6)
HGB: 12.8 g/dL (ref 11.6–15.9)
LYMPH%: 9.7 % — ABNORMAL LOW (ref 14.0–49.7)
MCH: 32.7 pg (ref 25.1–34.0)
MCHC: 33.5 g/dL (ref 31.5–36.0)
MCV: 97.4 fL (ref 79.5–101.0)
MONO#: 0.6 10*3/uL (ref 0.1–0.9)
MONO%: 11.3 % (ref 0.0–14.0)
NEUT#: 3.7 10*3/uL (ref 1.5–6.5)
NEUT%: 68.2 % (ref 38.4–76.8)
Platelets: 214 10*3/uL (ref 145–400)
RBC: 3.92 10*6/uL (ref 3.70–5.45)
RDW: 14 % (ref 11.2–14.5)
WBC: 5.5 10*3/uL (ref 3.9–10.3)
lymph#: 0.5 10*3/uL — ABNORMAL LOW (ref 0.9–3.3)

## 2012-10-01 LAB — COMPREHENSIVE METABOLIC PANEL (CC13)
ALT: 20 U/L (ref 0–55)
AST: 16 U/L (ref 5–34)
Albumin: 3.3 g/dL — ABNORMAL LOW (ref 3.5–5.0)
Alkaline Phosphatase: 65 U/L (ref 40–150)
BUN: 11.5 mg/dL (ref 7.0–26.0)
CO2: 31 mEq/L — ABNORMAL HIGH (ref 22–29)
Calcium: 9.1 mg/dL (ref 8.4–10.4)
Chloride: 100 mEq/L (ref 98–107)
Creatinine: 0.7 mg/dL (ref 0.6–1.1)
Glucose: 106 mg/dl — ABNORMAL HIGH (ref 70–99)
Potassium: 4.6 mEq/L (ref 3.5–5.1)
Sodium: 139 mEq/L (ref 136–145)
Total Bilirubin: 0.39 mg/dL (ref 0.20–1.20)
Total Protein: 7.2 g/dL (ref 6.4–8.3)

## 2012-10-01 MED ORDER — FLUOROURACIL CHEMO INJECTION 5 GM/100ML
1000.0000 mg/m2/d | INTRAVENOUS | Status: DC
  Administered 2012-10-01: 7300 mg via INTRAVENOUS
  Filled 2012-10-01: qty 146

## 2012-10-01 MED ORDER — SODIUM CHLORIDE 0.9 % IJ SOLN
10.0000 mL | Freq: Once | INTRAMUSCULAR | Status: AC
  Administered 2012-10-01: 10 mL
  Filled 2012-10-01: qty 10

## 2012-10-01 MED ORDER — HEPARIN SOD (PORK) LOCK FLUSH 100 UNIT/ML IV SOLN
250.0000 [IU] | Freq: Once | INTRAVENOUS | Status: AC
  Administered 2012-10-01: 250 [IU] via INTRAVENOUS
  Filled 2012-10-01: qty 5

## 2012-10-01 MED ORDER — MITOMYCIN CHEMO IV INJECTION 20 MG
10.0000 mg/m2 | Freq: Once | INTRAVENOUS | Status: AC
  Administered 2012-10-01: 18 mg via INTRAVENOUS
  Filled 2012-10-01: qty 36

## 2012-10-01 MED ORDER — SODIUM CHLORIDE 0.9 % IV SOLN
Freq: Once | INTRAVENOUS | Status: AC
  Administered 2012-10-01: 14:00:00 via INTRAVENOUS

## 2012-10-01 MED ORDER — DEXAMETHASONE SODIUM PHOSPHATE 10 MG/ML IJ SOLN
10.0000 mg | Freq: Once | INTRAMUSCULAR | Status: AC
  Administered 2012-10-01: 10 mg via INTRAVENOUS

## 2012-10-01 MED ORDER — PALONOSETRON HCL INJECTION 0.25 MG/5ML
0.2500 mg | Freq: Once | INTRAVENOUS | Status: AC
  Administered 2012-10-01: 0.25 mg via INTRAVENOUS

## 2012-10-01 MED ORDER — LIDOCAINE HCL 1 % IJ SOLN
INTRAMUSCULAR | Status: AC
  Filled 2012-10-01: qty 20

## 2012-10-01 NOTE — Procedures (Signed)
Interventional Radiology Procedure Note  Procedure:  Placement of a Left basilic vein 40 cm dual lumen PICC.  Tip at superior cavoatrial jxn and ready for use.  Complications: none Recommendations: - Routine line care  Signed,  Sterling Big, MD Vascular & Interventional Radiologist [facility] Radiology

## 2012-10-01 NOTE — Patient Instructions (Addendum)
[facility] Discharge Instructions for Patients Receiving Chemotherapy  Today you received the following chemotherapy agents :  Mitomycin,  5FU.  To help prevent nausea and vomiting after your treatment, we encourage you to take your nausea medication as instructed by your physician.    If you develop nausea and vomiting that is not controlled by your nausea medication, call the clinic. If it is after clinic hours your family physician or the after hours number for the clinic or go to the Emergency Department.   BELOW ARE SYMPTOMS THAT SHOULD BE REPORTED IMMEDIATELY:  *FEVER GREATER THAN 100.5 F  *CHILLS WITH OR WITHOUT FEVER  NAUSEA AND VOMITING THAT IS NOT CONTROLLED WITH YOUR NAUSEA MEDICATION  *UNUSUAL SHORTNESS OF BREATH  *UNUSUAL BRUISING OR BLEEDING  TENDERNESS IN MOUTH AND THROAT WITH OR WITHOUT PRESENCE OF ULCERS  *URINARY PROBLEMS  *BOWEL PROBLEMS  UNUSUAL RASH Items with * indicate a potential emergency and should be followed up as soon as possible.  One of the nurses will contact you 24 hours after your treatment. Please let the nurse know about any problems that you may have experienced. Feel free to call the clinic you have any questions or concerns. The clinic phone number is [phone].   I have been informed and understand all the instructions given to me. I know to contact the clinic, my physician, or go to the Emergency Department if any problems should occur. I do not have any questions at this time, but understand that I may call the clinic during office hours   should I have any questions or need assistance in obtaining follow up care.    __________________________________________  _____________  __________ Signature of Patient or Authorized Representative            Date                   Time    __________________________________________ Nurse's Signature

## 2012-10-01 NOTE — Telephone Encounter (Signed)
appts made and printed. i also sent MW an email for the pump d//c change time...td

## 2012-10-01 NOTE — Progress Notes (Signed)
   [facility]    OFFICE PROGRESS NOTE   INTERVAL HISTORY:   She returns as scheduled. No new complaint. She has noted increased erythema and superficial skin breakdown at the groin and perineum. She took a vacation to the beach last weekend.  Objective:  Vital signs in last 24 hours:  Blood pressure 106/71, pulse 90, temperature 98.3 F (36.8 C), temperature source Oral, resp. rate 18, height 5\' 5"  (1.651 m), weight 156 lb 9.6 oz (71.033 kg), last menstrual period 04/07/2008.    HEENT: No thrush or ulcers Resp: Lungs clear bilaterally Cardio: Regular rate and rhythm GI: No hepatomegaly, nontender Vascular: No leg edema  Skin: There is radiation hyperpigmentation, erythema, and superficial dry desquamation at the groin and perineum.     Lab Results:  Lab Results  Component Value Date   WBC 5.5 10/01/2012   HGB 12.8 10/01/2012   HCT 38.2 10/01/2012   MCV 97.4 10/01/2012   PLT 214 10/01/2012   ANC 3.7 Creatinine 0.7   Medications: I have reviewed the patient's current medications.  Assessment/Plan: 1.Anal cancer-left anal margin/anal canal mass, status post a biopsy on 08/09/2012 confirming invasive moderately differentiated squamous cell carcinoma,p16 positive.  -Staging PET scan 08/20/2012-hypermetabolic anal mass, no hypermetabolic metastases  -Initiation of concurrent radiation with cycle 1 of mitomycin C./5-fluorouracil 09/02/2012  2. Right breast cancer 2009, ER positive, PR positive, HER-2 positive. She is status post neoadjuvant AC x4 cycles followed by Taxol/Herceptin. She underwent a right lumpectomy and sentinel lymph node biopsy 09/22/2008 with no evidence of residual invasive carcinoma and 5 negative sentinel lymph nodes. She then completed right breast radiation. She began tamoxifen 12/15/2008 and completed adjuvant Herceptin 05/25/2009. The tamoxifen was discontinued and she was switched to Arimidex in July 2013.  3. Anxiety secondary to the new  diagnosis of anal cancer-she was given a prescription for Ativan to use as needed  4. Ongoing tobacco use  5. Enlargement of the right tonsil  6. Nonspecific pulmonary nodules without hypermetabolic activity on the PET scan 08/20/2012  7. Superficial skin breakdown at the groin/perineum secondary to radiation and chemotherapy-she is using a barrier cream   Disposition:  She appears stable. The plan is to proceed with cycle 2 of 5-FU/mitomycin-C today. She is scheduled to complete radiation next week. Ms. Pask will return for an office visit on 10/17/2012. We will refer her back to Dr. Abbey Chatters for a repeat anal exam after she has healed from radiation.   Thornton Papas, MD  10/01/2012  2:18 PM

## 2012-10-01 NOTE — Telephone Encounter (Signed)
, °

## 2012-10-02 ENCOUNTER — Ambulatory Visit: Payer: BC Managed Care – PPO

## 2012-10-02 ENCOUNTER — Ambulatory Visit
Admission: RE | Admit: 2012-10-02 | Discharge: 2012-10-02 | Disposition: A | Discharge status: Home / Self Care | Payer: BC Managed Care – PPO | Source: Ambulatory Visit | Attending: Radiation Oncology | Admitting: Radiation Oncology

## 2012-10-03 ENCOUNTER — Ambulatory Visit
Admission: RE | Admit: 2012-10-03 | Discharge: 2012-10-03 | Disposition: A | Discharge status: Home / Self Care | Payer: BC Managed Care – PPO | Source: Ambulatory Visit | Attending: Radiation Oncology | Admitting: Radiation Oncology

## 2012-10-03 ENCOUNTER — Ambulatory Visit (HOSPITAL_BASED_OUTPATIENT_CLINIC_OR_DEPARTMENT_OTHER): Payer: BC Managed Care – PPO

## 2012-10-03 ENCOUNTER — Ambulatory Visit: Payer: BC Managed Care – PPO

## 2012-10-03 VITALS — BP 112/70 | HR 68 | Temp 97.4°F | Resp 20

## 2012-10-03 DIAGNOSIS — C210 Malignant neoplasm of anus, unspecified: Secondary | ICD-10-CM

## 2012-10-03 DIAGNOSIS — C180 Malignant neoplasm of cecum: Secondary | ICD-10-CM

## 2012-10-03 DIAGNOSIS — Z452 Encounter for adjustment and management of vascular access device: Secondary | ICD-10-CM

## 2012-10-03 MED ORDER — HEPARIN SOD (PORK) LOCK FLUSH 100 UNIT/ML IV SOLN
500.0000 [IU] | Freq: Once | INTRAVENOUS | Status: DC
  Filled 2012-10-03: qty 5

## 2012-10-03 MED ORDER — SODIUM CHLORIDE 0.9 % IJ SOLN
10.0000 mL | INTRAMUSCULAR | Status: DC | PRN
  Filled 2012-10-03: qty 10

## 2012-10-03 MED ORDER — SODIUM CHLORIDE 0.9 % IJ SOLN
10.0000 mL | INTRAMUSCULAR | Status: DC | PRN
  Administered 2012-10-03: 10 mL via INTRAVENOUS
  Filled 2012-10-03: qty 10

## 2012-10-03 MED ORDER — HEPARIN SOD (PORK) LOCK FLUSH 100 UNIT/ML IV SOLN
500.0000 [IU] | Freq: Once | INTRAVENOUS | Status: AC
  Administered 2012-10-03: 250 [IU] via INTRAVENOUS
  Filled 2012-10-03: qty 5

## 2012-10-03 NOTE — Patient Instructions (Signed)
Call MD for problems or concerns 

## 2012-10-03 NOTE — Progress Notes (Signed)
Left picc line dressing changed.  87fu infusing and line #1  Flushed and cap changed.  Line #2 infusing 67fu.  Site unremarkable.

## 2012-10-04 ENCOUNTER — Encounter: Payer: Self-pay | Admitting: Radiation Oncology

## 2012-10-04 ENCOUNTER — Ambulatory Visit
Admission: RE | Admit: 2012-10-04 | Discharge: 2012-10-04 | Disposition: A | Discharge status: Home / Self Care | Payer: BC Managed Care – PPO | Source: Ambulatory Visit | Attending: Radiation Oncology | Admitting: Radiation Oncology

## 2012-10-04 ENCOUNTER — Ambulatory Visit: Payer: BC Managed Care – PPO

## 2012-10-04 VITALS — BP 108/73 | HR 79 | Temp 98.4°F | Resp 20 | Wt 158.5 lb

## 2012-10-04 DIAGNOSIS — C210 Malignant neoplasm of anus, unspecified: Secondary | ICD-10-CM

## 2012-10-04 NOTE — Progress Notes (Signed)
Pt c/o mild pain in groin/rectal area due to skin irritation, dry desquamation. She is applying Radiaplex lotion. She states her rectal area "has peeled". Pt denies diarrhea, urinary issues, vaginal discharge, loss of appetite. She is fatigued. She states that steroid w/chemo relieved her pain x 1 1/2 days. She also states that this weekend she expects to have pale yellow vaginal discharge, states "that is what happened after my last chemo".

## 2012-10-04 NOTE — Progress Notes (Signed)
   Department of Radiation Oncology  Phone:  [phone] Fax:        [phone]  Weekly Treatment Note    Name: Gina Cervantes Date: 10/04/2012 MRN: [id number] DOB: Apr 07, 1961   Current dose: 41.4 Gy  Current fraction: 23   MEDICATIONS: Current Outpatient Prescriptions  Medication Sig Dispense Refill  . anastrozole (ARIMIDEX) 1 MG tablet Take 1 mg by mouth daily. TAKES AT BEDTIME      . calcium-vitamin D 250-100 MG-UNIT per tablet Take 1 tablet by mouth daily.       . Cholecalciferol (VITAMIN D) 1000 UNITS capsule Take 1,000 Units by mouth daily.      Marland Kitchen ibuprofen (ADVIL,MOTRIN) 200 MG tablet Take 200 mg by mouth every 6 (six) hours as needed for pain. Take before bed      . LORazepam (ATIVAN) 0.5 MG tablet Take 1 tablet (0.5 mg total) by mouth every 6 (six) hours as needed for anxiety (or sleep).  30 tablet  2  . Multiple Vitamins-Minerals (MULTIVITAMIN WITH MINERALS) tablet Take 1 tablet by mouth daily.      . prochlorperazine (COMPAZINE) 10 MG tablet Take 1 tablet (10 mg total) by mouth every 6 (six) hours as needed.  30 tablet  2   No current facility-administered medications for this encounter.     ALLERGIES: Review of patient's allergies indicates no known allergies.   LABORATORY DATA:  Lab Results  Component Value Date   WBC 5.5 10/01/2012   HGB 12.8 10/01/2012   HCT 38.2 10/01/2012   MCV 97.4 10/01/2012   PLT 214 10/01/2012   Lab Results  Component Value Date   NA 139 10/01/2012   K 4.6 10/01/2012   CL 100 10/01/2012   CO2 31* 10/01/2012   Lab Results  Component Value Date   ALT 20 10/01/2012   AST 16 10/01/2012   ALKPHOS 65 10/01/2012   BILITOT 0.39 10/01/2012     NARRATIVE: Gina Cervantes was seen today for weekly treatment management. The chart was checked and the patient's films were reviewed. The patient is doing reasonably well. Some fatigue with her taking chemotherapy once again. Irritation in the anal region which is largely been stable. She is  using radioplex skin cream.  PHYSICAL EXAMINATION: weight is 158 lb 8 oz (71.895 kg). Her temperature is 98.4 F (36.9 C). Her blood pressure is 108/73 and her pulse is 79. Her respiration is 20.      desquamation is present area at overall her skin looks quite good for this phase of her treatment. Really no significant moist desquamation.  ASSESSMENT: The patient is doing satisfactorily with treatment.  PLAN: We will continue with the patient's radiation treatment as planned. We will continue her current skin care at this time. She will finish next week.

## 2012-10-05 ENCOUNTER — Ambulatory Visit (HOSPITAL_BASED_OUTPATIENT_CLINIC_OR_DEPARTMENT_OTHER): Payer: BC Managed Care – PPO

## 2012-10-05 ENCOUNTER — Ambulatory Visit: Payer: BC Managed Care – PPO

## 2012-10-05 DIAGNOSIS — C50419 Malignant neoplasm of upper-outer quadrant of unspecified female breast: Secondary | ICD-10-CM

## 2012-10-05 DIAGNOSIS — C210 Malignant neoplasm of anus, unspecified: Secondary | ICD-10-CM

## 2012-10-05 NOTE — Patient Instructions (Addendum)
Peripherally Inserted Central Catheter (PICC) Removal and Care After A peripherally inserted catheter (PICC) is removed when it is no longer needed, when it is clotted, or when it may be infected.  PROCEDURE  The removal of a PICC is usually painless. Removing the tape that holds the PICC in place may be the most discomfort you have.  A physicians order needs to be obtained to have the PICC removed.  A PICC can be removed in the hospital or in an outpatient setting.  Never remove or take out the PICC yourself. Only a trained clinical professional, such as a PICC nurse, should remove the PICC.  If a PICC is suspected to be infected, the PICC tip is sent to the lab for culture. HOME CARE INSTRUCTIONS  When the PICC is out, pressure is applied at the insertion site to prevent bleeding. An antibiotic ointment may be applied to the insertion site. A dry, sterile gauze is then taped over the insertion site. This dressing should stay on for 24 hours.  After the 24 hours is up, the dressing may be removed. The PICC insertion site is very small. A small scab may develop over the insertion site. It is okay to wash the site gently with soap and water. Be careful to not remove or pick the scab off. After washing, gently pat the site dry. You do not need to put another dressing over the insertion site after you wash it.  Avoid heavy, strenuous physical activity for 24 hours after the PICC is removed. This includes things like:  Weight lifting.  Strenuous yard work.  Any physical activity with repetitive arm movement. SEEK MEDICAL CARE IF:  Call or see your caregiver as soon as possible if you develop the following conditions in the arm in which the PICC was inserted:  Swelling or puffiness.  Increasing tenderness or pain. SEEK IMMEDIATE MEDICAL CARE IF:  You develop any of the following conditions in the arm that had the PICC:  Numbness or tingling in your fingers, hand, or arm.  You arm has  a bluish color and it is cold to the touch.  Redness around the insertion site or a red-streak that goes up your arm.  Any type of drainage from the PICC insertion site. This includes drainage such as:  Bleeding from the insertion site. (If this happens, apply firm, direct pressure to the PICC insertion site with a clean towel.)  Drainage that is yellow or tan in color.  You have an oral temperature above 102 F (38.9 C), not controlled by medicine. Document Released: 10/12/2009 Document Revised: 07/17/2011 Document Reviewed: 10/12/2009 [employer] Patient Information 2014 [employer].  

## 2012-10-05 NOTE — Progress Notes (Signed)
PICC line removed.  40cm line confirmed intact.  Pt rested in chair 30 min post removal of line.  Pressure dressing applied.  Pt instructed to keep dressing clean dry and intact for 24 hours.

## 2012-10-07 ENCOUNTER — Ambulatory Visit
Admission: RE | Admit: 2012-10-07 | Discharge: 2012-10-07 | Disposition: A | Discharge status: Home / Self Care | Payer: BC Managed Care – PPO | Source: Ambulatory Visit | Attending: Radiation Oncology | Admitting: Radiation Oncology

## 2012-10-08 ENCOUNTER — Ambulatory Visit
Admission: RE | Admit: 2012-10-08 | Discharge: 2012-10-08 | Disposition: A | Discharge status: Home / Self Care | Payer: BC Managed Care – PPO | Source: Ambulatory Visit | Attending: Radiation Oncology | Admitting: Radiation Oncology

## 2012-10-08 VITALS — BP 95/68 | HR 111 | Temp 98.0°F | Wt 157.2 lb

## 2012-10-08 DIAGNOSIS — C210 Malignant neoplasm of anus, unspecified: Secondary | ICD-10-CM

## 2012-10-08 MED ORDER — SILVER SULFADIAZINE 1 % EX CREA
TOPICAL_CREAM | Freq: Two times a day (BID) | CUTANEOUS | Status: DC
  Administered 2012-10-08: 16:00:00 via TOPICAL

## 2012-10-08 NOTE — Progress Notes (Signed)
[name] here for increased pain in her rectal/pelvic area.  Her skin is very red and is peeling all over her rectal and pelvic area.  She does have a blister on her rectal area.  She has been using miaderm cream.  She is not able to sit down.

## 2012-10-08 NOTE — Progress Notes (Signed)
[facility]    Radiation Oncology [address] [place]     Maryln Gottron, M.D. [place], [place] [address]               Billie Lade, M.D., Ph.D. Phone: [phone]      Molli Hazard A. Kathrynn Running, M.D. Fax: [phone]      Radene Gunning, M.D., Ph.D.         Lurline Hare, M.D.         Grayland Jack, M.D Weekly Treatment Management Note  Name: Gina Cervantes     MRN: [id number]        CSN: [id number] Date: 10/08/2012      DOB: [birth year]  CC: No PCP Per Patient         [place]    Status: Outpatient  Diagnosis: The encounter diagnosis was Anal cancer.  Current Dose: 46.8 Gy  Current Fraction: 26  Planned Dose: 50.4 Gy  Narrative: Gina Cervantes was seen today for weekly treatment management. The chart was checked and MVCT  were reviewed. The patient asked to be seen today for complaints of increased discomfort and pain within the treatment area. She has been unable to control this issue with over-the-counter medication. She denies any chills or fever.  Review of patient's allergies indicates no known allergies. Current Outpatient Prescriptions  Medication Sig Dispense Refill  . anastrozole (ARIMIDEX) 1 MG tablet Take 1 mg by mouth daily. TAKES AT BEDTIME      . calcium-vitamin D 250-100 MG-UNIT per tablet Take 1 tablet by mouth daily.       . Cholecalciferol (VITAMIN D) 1000 UNITS capsule Take 1,000 Units by mouth daily.      Marland Kitchen ibuprofen (ADVIL,MOTRIN) 200 MG tablet Take 200 mg by mouth every 6 (six) hours as needed for pain. Take before bed      . LORazepam (ATIVAN) 0.5 MG tablet Take 1 tablet (0.5 mg total) by mouth every 6 (six) hours as needed for anxiety (or sleep).  30 tablet  2  . Multiple Vitamins-Minerals (MULTIVITAMIN WITH MINERALS) tablet Take 1 tablet by mouth daily.      . prochlorperazine (COMPAZINE) 10 MG tablet Take 1 tablet (10 mg total) by mouth every 6 (six) hours as needed.  30 tablet  2  . HYDROcodone-acetaminophen (NORCO/VICODIN) 5-325  MG per tablet Take 1 tablet by mouth every 4 (four) hours as needed for pain.       No current facility-administered medications for this encounter.   Labs:  Lab Results  Component Value Date   WBC 5.5 10/01/2012   HGB 12.8 10/01/2012   HCT 38.2 10/01/2012   MCV 97.4 10/01/2012   PLT 214 10/01/2012   Lab Results  Component Value Date   CREATININE 0.7 10/01/2012   BUN 11.5 10/01/2012   NA 139 10/01/2012   K 4.6 10/01/2012   CL 100 10/01/2012   CO2 31* 10/01/2012   Lab Results  Component Value Date   ALT 20 10/01/2012   AST 16 10/01/2012   BILITOT 0.39 10/01/2012    Physical Examination:  weight is 157 lb 3.2 oz (71.305 kg). Her temperature is 98 F (36.7 C). Her blood pressure is 95/68 and her pulse is 111.    Wt Readings from Last 3 Encounters:  10/08/12 157 lb 3.2 oz (71.305 kg)  10/04/12 158 lb 8 oz (71.895 kg)  10/01/12 156 lb 9.6 oz (71.033 kg)     Lungs - Normal respiratory effort, chest  expands symmetrically. Lungs are clear to auscultation, no crackles or wheezes.  Heart has regular rhythm and rate  Abdomen is soft and non tender with normal bowel sounds The inguinal and peri-buttocks area shows erythema and dry desquamation with impending moist desquamation  Assessment:  Patient tolerating treatments well except for above issues  Plan: Continue treatment per original radiation prescription. The patient has been given Silvadene for her skin reaction. She has also been given a prescription for Vicodin.

## 2012-10-08 NOTE — Addendum Note (Signed)
Encounter addended by: Eduardo Osier, RN on: 10/08/2012  4:02 PM<BR>     Documentation filed: Inpatient MAR

## 2012-10-08 NOTE — Addendum Note (Signed)
Encounter addended by: Eduardo Osier, RN on: 10/08/2012  3:59 PM<BR>     Documentation filed: Orders

## 2012-10-09 ENCOUNTER — Ambulatory Visit
Admission: RE | Admit: 2012-10-09 | Discharge: 2012-10-09 | Disposition: A | Discharge status: Home / Self Care | Payer: BC Managed Care – PPO | Source: Ambulatory Visit | Attending: Radiation Oncology | Admitting: Radiation Oncology

## 2012-10-10 ENCOUNTER — Ambulatory Visit
Admission: RE | Admit: 2012-10-10 | Discharge: 2012-10-10 | Disposition: A | Discharge status: Home / Self Care | Payer: BC Managed Care – PPO | Source: Ambulatory Visit | Attending: Radiation Oncology | Admitting: Radiation Oncology

## 2012-10-10 ENCOUNTER — Other Ambulatory Visit (HOSPITAL_BASED_OUTPATIENT_CLINIC_OR_DEPARTMENT_OTHER): Payer: BC Managed Care – PPO

## 2012-10-10 ENCOUNTER — Telehealth: Payer: Self-pay | Admitting: *Deleted

## 2012-10-10 ENCOUNTER — Encounter: Payer: Self-pay | Admitting: Radiation Oncology

## 2012-10-10 VITALS — BP 143/85 | HR 100 | Temp 98.2°F | Resp 20 | Wt 154.1 lb

## 2012-10-10 DIAGNOSIS — C210 Malignant neoplasm of anus, unspecified: Secondary | ICD-10-CM

## 2012-10-10 DIAGNOSIS — D490 Neoplasm of unspecified behavior of digestive system: Secondary | ICD-10-CM

## 2012-10-10 LAB — CBC WITH DIFFERENTIAL/PLATELET
BASO%: 0.4 % (ref 0.0–2.0)
Basophils Absolute: 0 10*3/uL (ref 0.0–0.1)
EOS%: 10.3 % — ABNORMAL HIGH (ref 0.0–7.0)
Eosinophils Absolute: 0.5 10*3/uL (ref 0.0–0.5)
HCT: 36.2 % (ref 34.8–46.6)
HGB: 12.2 g/dL (ref 11.6–15.9)
LYMPH%: 3.6 % — ABNORMAL LOW (ref 14.0–49.7)
MCH: 32.8 pg (ref 25.1–34.0)
MCHC: 33.7 g/dL (ref 31.5–36.0)
MCV: 97.3 fL (ref 79.5–101.0)
MONO#: 0.4 10*3/uL (ref 0.1–0.9)
MONO%: 8.3 % (ref 0.0–14.0)
NEUT#: 3.9 10*3/uL (ref 1.5–6.5)
NEUT%: 77.4 % — ABNORMAL HIGH (ref 38.4–76.8)
Platelets: 228 10*3/uL (ref 145–400)
RBC: 3.72 10*6/uL (ref 3.70–5.45)
RDW: 14.3 % (ref 11.2–14.5)
WBC: 5 10*3/uL (ref 3.9–10.3)
lymph#: 0.2 10*3/uL — ABNORMAL LOW (ref 0.9–3.3)

## 2012-10-10 LAB — BASIC METABOLIC PANEL (CC13)
BUN: 12.2 mg/dL (ref 7.0–26.0)
CO2: 27 mEq/L (ref 22–29)
Calcium: 9.2 mg/dL (ref 8.4–10.4)
Chloride: 99 mEq/L (ref 98–107)
Creatinine: 0.8 mg/dL (ref 0.6–1.1)
Glucose: 123 mg/dl — ABNORMAL HIGH (ref 70–99)
Potassium: 4 mEq/L (ref 3.5–5.1)
Sodium: 138 mEq/L (ref 136–145)

## 2012-10-10 MED ORDER — SILVER SULFADIAZINE 1 % EX CREA: TOPICAL_CREAM | Freq: Two times a day (BID) | CUTANEOUS | Status: DC

## 2012-10-10 MED ORDER — SILVER SULFADIAZINE 1 % EX CREA
TOPICAL_CREAM | Freq: Two times a day (BID) | CUTANEOUS | Status: DC
  Administered 2012-10-10: 15:00:00 via TOPICAL

## 2012-10-10 NOTE — Progress Notes (Signed)
Department of Radiation Oncology  Phone:  [phone] Fax:        [phone]  Weekly Treatment Note    Name: Gina Cervantes Date: 10/10/2012 MRN: [id number] DOB: Jul 17, 1960   Current dose: 41.4 Gy  Current fraction: 28   MEDICATIONS: Current Outpatient Prescriptions  Medication Sig Dispense Refill  . anastrozole (ARIMIDEX) 1 MG tablet Take 1 mg by mouth daily. TAKES AT BEDTIME      . calcium-vitamin D 250-100 MG-UNIT per tablet Take 1 tablet by mouth daily.       . Cholecalciferol (VITAMIN D) 1000 UNITS capsule Take 1,000 Units by mouth daily.      Marland Kitchen HYDROcodone-acetaminophen (NORCO/VICODIN) 5-325 MG per tablet Take 1 tablet by mouth every 4 (four) hours as needed for pain.      Marland Kitchen ibuprofen (ADVIL,MOTRIN) 200 MG tablet Take 200 mg by mouth every 6 (six) hours as needed for pain. Take before bed      . LORazepam (ATIVAN) 0.5 MG tablet Take 1 tablet (0.5 mg total) by mouth every 6 (six) hours as needed for anxiety (or sleep).  30 tablet  2  . Multiple Vitamins-Minerals (MULTIVITAMIN WITH MINERALS) tablet Take 1 tablet by mouth daily.      . prochlorperazine (COMPAZINE) 10 MG tablet Take 1 tablet (10 mg total) by mouth every 6 (six) hours as needed.  30 tablet  2  . silver sulfADIAZINE (SILVADENE) 1 % cream Apply topically 2 (two) times daily.      . silver sulfADIAZINE (SILVADENE) 1 % cream Apply topically 2 (two) times daily.  400 g  0   Current Facility-Administered Medications  Medication Dose Route Frequency Provider Last Rate Last Dose  . silver sulfADIAZINE (SILVADENE) 1 % cream   Topical BID Jonna Coup, MD         ALLERGIES: Review of patient's allergies indicates no known allergies.   LABORATORY DATA:  Lab Results  Component Value Date   WBC 5.0 10/10/2012   HGB 12.2 10/10/2012   HCT 36.2 10/10/2012   MCV 97.3 10/10/2012   PLT 228 10/10/2012   Lab Results  Component Value Date   NA 138 10/10/2012   K 4.0 10/10/2012   CL 99 10/10/2012   CO2 27 10/10/2012   Lab  Results  Component Value Date   ALT 20 10/01/2012   AST 16 10/01/2012   ALKPHOS 65 10/01/2012   BILITOT 0.39 10/01/2012     NARRATIVE: Gina Cervantes was seen today for weekly treatment management. The chart was checked and the patient's films were reviewed. The patient finished her final treatment today. She is understandably very pleased with this. Continued significant skin irritation. This is present in the anal region as well as in the inguinal regions bilaterally. She began using Silvadene cream earlier this week which has significantly helped. She also has hydrocodone pain medicine which he also began this week which is helping.  PHYSICAL EXAMINATION: weight is 154 lb 1.6 oz (69.899 kg). Her oral temperature is 98.2 F (36.8 C). Her blood pressure is 143/85 and her pulse is 100. Her respiration is 20.      diffuse significant erythema/desquamation with some moist desquamation in the anal region. Dry desquamation over a broad area in the inguinal regions.  ASSESSMENT: The patient completed treatment today. Overall I believe she did fairly well. We will continue her current skin care and we have given her a refill for Silvadene cream.  PLAN: I will have the patient return  to clinic in 2 months for followup.

## 2012-10-10 NOTE — Progress Notes (Signed)
Pt completed tx today. She is applying Silvadene to inner thighs, anal area bid-tid. She has dry desquamation of inner thighs. Pt states the area itches, wakes her at night, and she gets into cool bath. Pt taking Hydrocodone once during day and nightly which relaxes her so she can rest. Pt states she had diarrhea once during the night, "self inflicted" due to her dinner last night. She states she had normal BM this morning. She denies urinary issues, loss of appetite. Gave pt 1 month FU card.

## 2012-10-10 NOTE — Telephone Encounter (Signed)
Called patient to inform of test, spoke with patient and she is aware of this test. 

## 2012-10-11 ENCOUNTER — Ambulatory Visit: Payer: BC Managed Care – PPO

## 2012-10-17 ENCOUNTER — Other Ambulatory Visit (HOSPITAL_BASED_OUTPATIENT_CLINIC_OR_DEPARTMENT_OTHER): Payer: BC Managed Care – PPO | Admitting: Lab

## 2012-10-17 ENCOUNTER — Ambulatory Visit (HOSPITAL_BASED_OUTPATIENT_CLINIC_OR_DEPARTMENT_OTHER): Payer: BC Managed Care – PPO | Admitting: Nurse Practitioner

## 2012-10-17 ENCOUNTER — Telehealth: Payer: Self-pay | Admitting: Oncology

## 2012-10-17 VITALS — BP 100/65 | HR 97 | Temp 98.4°F | Resp 18 | Ht 65.0 in | Wt 154.0 lb

## 2012-10-17 DIAGNOSIS — F411 Generalized anxiety disorder: Secondary | ICD-10-CM

## 2012-10-17 DIAGNOSIS — Z853 Personal history of malignant neoplasm of breast: Secondary | ICD-10-CM

## 2012-10-17 DIAGNOSIS — C210 Malignant neoplasm of anus, unspecified: Secondary | ICD-10-CM

## 2012-10-17 DIAGNOSIS — F17200 Nicotine dependence, unspecified, uncomplicated: Secondary | ICD-10-CM

## 2012-10-17 LAB — CBC WITH DIFFERENTIAL/PLATELET
BASO%: 0.6 % (ref 0.0–2.0)
Basophils Absolute: 0 10*3/uL (ref 0.0–0.1)
EOS%: 9.5 % — ABNORMAL HIGH (ref 0.0–7.0)
Eosinophils Absolute: 0.3 10*3/uL (ref 0.0–0.5)
HCT: 35.5 % (ref 34.8–46.6)
HGB: 12.2 g/dL (ref 11.6–15.9)
LYMPH%: 15.2 % (ref 14.0–49.7)
MCH: 33.9 pg (ref 25.1–34.0)
MCHC: 34.4 g/dL (ref 31.5–36.0)
MCV: 98.6 fL (ref 79.5–101.0)
MONO#: 0.6 10*3/uL (ref 0.1–0.9)
MONO%: 16 % — ABNORMAL HIGH (ref 0.0–14.0)
NEUT#: 2.1 10*3/uL (ref 1.5–6.5)
NEUT%: 58.7 % (ref 38.4–76.8)
Platelets: 184 10*3/uL (ref 145–400)
RBC: 3.6 10*6/uL — ABNORMAL LOW (ref 3.70–5.45)
RDW: 15.5 % — ABNORMAL HIGH (ref 11.2–14.5)
WBC: 3.5 10*3/uL — ABNORMAL LOW (ref 3.9–10.3)
lymph#: 0.5 10*3/uL — ABNORMAL LOW (ref 0.9–3.3)

## 2012-10-17 NOTE — Telephone Encounter (Signed)
gv avs appt to pt.Gina KitchenMarland Cervantes

## 2012-10-17 NOTE — Progress Notes (Signed)
OFFICE PROGRESS NOTE  Interval history:  Ms. Leaf returns as scheduled. She completed cycle 2 of 5-fluorouracil/mitomycin C. beginning 10/01/2012. She had mild nausea. No mouth sores. She has noted darkening of the fingernails. She feels "weak and tired". She continues to have discomfort at the perineum.   Objective: Blood pressure 100/65, pulse 97, temperature 98.4 F (36.9 C), temperature source Oral, resp. rate 18, height 5\' 5"  (1.651 m), weight 154 lb (69.854 kg), last menstrual period 04/07/2008.  Oropharynx is without thrush or ulceration. Lungs are clear. Regular cardiac rhythm. Abdomen is soft and nontender. No hepatomegaly. Radiation hyperpigmentation,mild erythema and mild dry desquamation at the groin. Erythema with scattered areas of skin breakdown at the perineum.  Lab Results: Lab Results  Component Value Date   WBC 3.5* 10/17/2012   HGB 12.2 10/17/2012   HCT 35.5 10/17/2012   MCV 98.6 10/17/2012   PLT 184 10/17/2012    Chemistry:    Chemistry      Component Value Date/Time   NA 138 10/10/2012 1315   NA 140 08/02/2012 0955   K 4.0 10/10/2012 1315   K 4.3 08/02/2012 0955   CL 99 10/10/2012 1315   CL 100 08/02/2012 0955   CO2 27 10/10/2012 1315   CO2 29 08/02/2012 0955   BUN 12.2 10/10/2012 1315   BUN 9 08/02/2012 0955   CREATININE 0.8 10/10/2012 1315   CREATININE 0.59 08/02/2012 0955      Component Value Date/Time   CALCIUM 9.2 10/10/2012 1315   CALCIUM 9.1 08/02/2012 0955   ALKPHOS 65 10/01/2012 1023   ALKPHOS 68 08/02/2012 0955   AST 16 10/01/2012 1023   AST 18 08/02/2012 0955   ALT 20 10/01/2012 1023   ALT 16 08/02/2012 0955   BILITOT 0.39 10/01/2012 1023   BILITOT 0.3 08/02/2012 0955       Studies/Results: Ir Fluoro Guide Cv Line Left  10/01/2012   *RADIOLOGY REPORT*  PICC PLACEMENT WITH ULTRASOUND AND FLUOROSCOPIC  GUIDANCE  Clinical History: 51-year female with anal carcinoma in need of central venous access for chemotherapy.  Fluoroscopy Time: 1 minute 0 seconds minutes.   Procedure:  The left arm was prepped with chlorhexidine, draped in the usual sterile fashion using maximum barrier technique (cap and mask, sterile gown, sterile gloves, large sterile sheet, hand hygiene and cutaneous antiseptic).  Local anesthesia was attained by infiltration with 1% lidocaine.  Ultrasound demonstrated patency of the left basilic vein, and this was documented with an image.  Under real-time ultrasound guidance, this vein was accessed with a 21 gauge micropuncture needle and image documentation was performed.  The needle was exchanged over a guidewire for a peel-away sheath through which a 40 cm 5 [place] dual lumen power injectable PICC was advanced, and positioned with its tip at the lower SVC/right atrial junction.  Fluoroscopy during the procedure and fluoro spot radiograph confirms appropriate catheter position.  The catheter was flushed, secured to the skin with Prolene sutures, and covered with a sterile dressing.  Complications:  None.  The patient tolerated the procedure well.  IMPRESSION: Successful placement of a left basilic vein approach 40 cm dual lumen power PICC with sonographic and fluoroscopic guidance.  The catheter is ready for use.  Signed,  Sterling Big, MD Vascular & Interventional Radiologist [facility] Radiology   Original Report Authenticated By: Malachy Moan, M.D.   Ir US Guide Vasc Access Left  10/01/2012   *RADIOLOGY REPORT*  PICC PLACEMENT WITH ULTRASOUND AND FLUOROSCOPIC  GUIDANCE  Clinical History:  51-year female with anal carcinoma in need of central venous access for chemotherapy.  Fluoroscopy Time: 1 minute 0 seconds minutes.  Procedure:  The left arm was prepped with chlorhexidine, draped in the usual sterile fashion using maximum barrier technique (cap and mask, sterile gown, sterile gloves, large sterile sheet, hand hygiene and cutaneous antiseptic).  Local anesthesia was attained by infiltration with 1% lidocaine.  Ultrasound demonstrated patency  of the left basilic vein, and this was documented with an image.  Under real-time ultrasound guidance, this vein was accessed with a 21 gauge micropuncture needle and image documentation was performed.  The needle was exchanged over a guidewire for a peel-away sheath through which a 40 cm 5 [place] dual lumen power injectable PICC was advanced, and positioned with its tip at the lower SVC/right atrial junction.  Fluoroscopy during the procedure and fluoro spot radiograph confirms appropriate catheter position.  The catheter was flushed, secured to the skin with Prolene sutures, and covered with a sterile dressing.  Complications:  None.  The patient tolerated the procedure well.  IMPRESSION: Successful placement of a left basilic vein approach 40 cm dual lumen power PICC with sonographic and fluoroscopic guidance.  The catheter is ready for use.  Signed,  Sterling Big, MD Vascular & Interventional Radiologist [facility] Radiology   Original Report Authenticated By: Malachy Moan, M.D.    Medications: I have reviewed the patient's current medications.  Assessment/Plan:  1.Anal cancer-left anal margin/anal canal mass, status post a biopsy on 08/09/2012 confirming invasive moderately differentiated squamous cell carcinoma,p16 positive.  -Staging PET scan 08/20/2012-hypermetabolic anal mass, no hypermetabolic metastases  -Initiation of concurrent radiation with cycle 1 of mitomycin C./5-fluorouracil 09/02/2012. -She completed cycle 2 5-fluorouracil/mitomycin C. beginning 10/01/2012. -She completed radiation 10/10/2012.  2. Right breast cancer 2009, ER positive, PR positive, HER-2 positive. She is status post neoadjuvant AC x4 cycles followed by Taxol/Herceptin. She underwent a right lumpectomy and sentinel lymph node biopsy 09/22/2008 with no evidence of residual invasive carcinoma and 5 negative sentinel lymph nodes. She then completed right breast radiation. She began tamoxifen 12/15/2008 and  completed adjuvant Herceptin 05/25/2009. The tamoxifen was discontinued and she was switched to Arimidex in July 2013.  3. Anxiety secondary to the new diagnosis of anal cancer-she was given a prescription for Ativan to use as needed.  4. Ongoing tobacco use.  5. Enlargement of the right tonsil.  6. Nonspecific pulmonary nodules without hypermetabolic activity on the PET scan 08/20/2012. She is scheduled for a CT scan of the chest on 10/21/2012.  7. Superficial skin breakdown at the groin/perineum secondary to radiation and chemotherapy-she is using a barrier cream.  Disposition-she appears stable. She plans to followup with Dr. Abbey Chatters in August. She will return for a followup visit with Dr. Truett Perna mid to late August. She will contact the office prior to her next visit with any problems.  Plan reviewed with Dr. Truett Perna.   Lonna Cobb ANP/GNP-BC

## 2012-10-21 ENCOUNTER — Encounter (HOSPITAL_COMMUNITY): Payer: Self-pay

## 2012-10-21 ENCOUNTER — Ambulatory Visit (HOSPITAL_COMMUNITY)
Admission: RE | Admit: 2012-10-21 | Discharge: 2012-10-21 | Disposition: A | Discharge status: Home / Self Care | Payer: BC Managed Care – PPO | Source: Ambulatory Visit | Attending: Radiation Oncology | Admitting: Radiation Oncology

## 2012-10-21 DIAGNOSIS — C210 Malignant neoplasm of anus, unspecified: Secondary | ICD-10-CM | POA: Insufficient documentation

## 2012-10-21 DIAGNOSIS — R918 Other nonspecific abnormal finding of lung field: Secondary | ICD-10-CM | POA: Insufficient documentation

## 2012-10-21 DIAGNOSIS — C50919 Malignant neoplasm of unspecified site of unspecified female breast: Secondary | ICD-10-CM | POA: Insufficient documentation

## 2012-10-21 DIAGNOSIS — D490 Neoplasm of unspecified behavior of digestive system: Secondary | ICD-10-CM

## 2012-10-21 MED ORDER — IOHEXOL 300 MG/ML  SOLN
80.0000 mL | Freq: Once | INTRAMUSCULAR | Status: AC | PRN
  Administered 2012-10-21: 80 mL via INTRAVENOUS

## 2012-10-22 ENCOUNTER — Encounter: Payer: Self-pay | Admitting: Oncology

## 2012-10-23 ENCOUNTER — Ambulatory Visit
Admission: RE | Admit: 2012-10-23 | Discharge: 2012-10-23 | Disposition: A | Discharge status: Home / Self Care | Payer: BC Managed Care – PPO | Source: Ambulatory Visit | Attending: Radiation Oncology | Admitting: Radiation Oncology

## 2012-10-23 VITALS — BP 105/65 | HR 98 | Temp 99.4°F | Ht 65.0 in | Wt 155.8 lb

## 2012-10-23 DIAGNOSIS — D490 Neoplasm of unspecified behavior of digestive system: Secondary | ICD-10-CM

## 2012-10-23 NOTE — Progress Notes (Signed)
Gina Cervantes here for follow up after treatment to her pelvis.  She denies pain.  She does have fatigue.  She states her skin is much better.  She does not have any open areas.  She is applying silvadene.

## 2012-10-24 ENCOUNTER — Telehealth: Payer: Self-pay | Admitting: *Deleted

## 2012-10-24 NOTE — Telephone Encounter (Signed)
XXXX 

## 2012-10-24 NOTE — Progress Notes (Signed)
Radiation Oncology         (336) [phone] ________________________________  Name: Gina Cervantes MRN: [id number]  Date: 10/23/2012  DOB: 11-Jul-1960  Follow-Up Visit Note  CC: No PCP Per Patient  No ref. provider found  Diagnosis:   Squamous cell carcinoma of the anal canal  Interval Since Last Radiation:  2 weeks   Narrative:  The patient returns today for routine follow-up.  The patient comes back in for a skin check. She recently completed her course of radiotherapy. She is pleased with how her skin has done since that time. It has healed significantly. She has been using Silvadene cream.                              ALLERGIES:  has No Known Allergies.  Meds: Current Outpatient Prescriptions  Medication Sig Dispense Refill  . anastrozole (ARIMIDEX) 1 MG tablet Take 1 mg by mouth daily. TAKES AT BEDTIME      . calcium-vitamin D 250-100 MG-UNIT per tablet Take 1 tablet by mouth daily.       . Cholecalciferol (VITAMIN D) 1000 UNITS capsule Take 1,000 Units by mouth daily.      Marland Kitchen ibuprofen (ADVIL,MOTRIN) 200 MG tablet Take 200 mg by mouth every 6 (six) hours as needed for pain. Take before bed      . Multiple Vitamins-Minerals (MULTIVITAMIN WITH MINERALS) tablet Take 1 tablet by mouth daily.      . silver sulfADIAZINE (SILVADENE) 1 % cream Apply topically 2 (two) times daily.  400 g  0  . silver sulfADIAZINE (SILVADENE) 1 % cream Apply topically 2 (two) times daily.      Marland Kitchen HYDROcodone-acetaminophen (NORCO/VICODIN) 5-325 MG per tablet Take 1 tablet by mouth every 4 (four) hours as needed for pain.      Marland Kitchen LORazepam (ATIVAN) 0.5 MG tablet Take 1 tablet (0.5 mg total) by mouth every 6 (six) hours as needed for anxiety (or sleep).  30 tablet  2   No current facility-administered medications for this encounter.    Physical Findings: The patient is in no acute distress. Patient is alert and oriented.  height is 5\' 5"  (1.651 m) and weight is 155 lb 12.8 oz (70.67 kg). Her temperature is  99.4 F (37.4 C). Her blood pressure is 105/65 and her pulse is 98. .   The skin looks quite good for how soon after her treatment she is. No ongoing moist desquamation.  Lab Findings: Lab Results  Component Value Date   WBC 3.5* 10/17/2012   HGB 12.2 10/17/2012   HCT 35.5 10/17/2012   MCV 98.6 10/17/2012   PLT 184 10/17/2012     Radiographic Findings: Ct Chest W Contrast  10/21/2012   *RADIOLOGY REPORT*  Clinical Data: Follow-up lung nodules seen on PET scan.  Anal cancer diagnosed 07/2012, chemotherapy and XRT complete.  Breast cancer diagnosed 2009 status post right lumpectomy.  CT CHEST WITH CONTRAST  Technique:  Multidetector CT imaging of the chest was performed following the standard protocol during bolus administration of intravenous contrast.  Contrast: 80mL OMNIPAQUE IOHEXOL 300 MG/ML  SOLN  Comparison: PET CT dated 08/20/2012.  CT chest dated 01/22/2009.  Findings: Biapical pleural parenchymal scarring, most prominent in the posterior right upper lobe (series 5/image 10).  Scattered pulmonary nodules/opacities which are similar to 2010, including: --5 x 4 mm left upper lobe nodule (series 5/image 14) --6 x 4 mm subpleural right upper lobe nodule (  series 5/image 15) --Three 3 mm subpleural right upper lobe pulmonary nodules (series 5/images 18-20) --11 mm ground-glass opacity in the posterior right upper lobe (series 5/image 24) --15 mm ground-glass opacity in the right lower lobe (series 5/image 37) --5 mm nodule along the right major fissure (series 5/image 41)  Scattered pulmonary nodules/opacities which were not evident in 2010, including: --3 mm nodule in the medial right upper lobe (series 5/image 19) --12 mm ground-glass opacity in the left upper lobe (series 5/image 25) --3 mm left upper lobe nodule (series 5/image 28) --6 x 5 mm left lower lobe ground-glass nodule (series 5/image 39)  None of these findings are suggestive of metastases.  No pleural effusion or pneumothorax.  Visualized  thyroid is unremarkable.  The heart is normal in size.  No pericardial effusion.  Small mediastinal lymph nodes, including an 8 mm short axis precarinal node (series 2/image 25) and a 10 mm short-axis right hilar node (series 2/image 29), non-FDG-avid on PET, likely reactive.  Visualized upper abdomen is notable for three tiny hypoenhancing hepatic lesions (series 2/images 56, 61, and 67), too small to characterize.  Mild degenerative changes of the visualized thoracolumbar spine.  IMPRESSION: No findings suggestive of metastatic disease in the chest.  Two 3 mm nodules in the bilateral upper lobes, not definitely evident in 2010, but likely benign.  Attention on follow-up suggested.  Additional scattered pulmonary nodules which are similar to 2010, likely benign.  Scattered ground-glass nodule/opacities, including a 12 mm left upper lobe opacity which is new. Consider annual surveillance for at least 3 years.  This recommendation follows the consensus statement: Recommendations for the Management of Subsolid Pulmonary Nodules Detected at CT:  A Statement from the Fleischner Society. Radiology 2013; 266:304-317.   Original Report Authenticated By: Charline Bills, M.D.   Ir Fluoro Guide Cv Line Left  10/01/2012   *RADIOLOGY REPORT*  PICC PLACEMENT WITH ULTRASOUND AND FLUOROSCOPIC  GUIDANCE  Clinical History: 51-year female with anal carcinoma in need of central venous access for chemotherapy.  Fluoroscopy Time: 1 minute 0 seconds minutes.  Procedure:  The left arm was prepped with chlorhexidine, draped in the usual sterile fashion using maximum barrier technique (cap and mask, sterile gown, sterile gloves, large sterile sheet, hand hygiene and cutaneous antiseptic).  Local anesthesia was attained by infiltration with 1% lidocaine.  Ultrasound demonstrated patency of the left basilic vein, and this was documented with an image.  Under real-time ultrasound guidance, this vein was accessed with a 21 gauge  micropuncture needle and image documentation was performed.  The needle was exchanged over a guidewire for a peel-away sheath through which a 40 cm 5 [place] dual lumen power injectable PICC was advanced, and positioned with its tip at the lower SVC/right atrial junction.  Fluoroscopy during the procedure and fluoro spot radiograph confirms appropriate catheter position.  The catheter was flushed, secured to the skin with Prolene sutures, and covered with a sterile dressing.  Complications:  None.  The patient tolerated the procedure well.  IMPRESSION: Successful placement of a left basilic vein approach 40 cm dual lumen power PICC with sonographic and fluoroscopic guidance.  The catheter is ready for use.  Signed,  Sterling Big, MD Vascular & Interventional Radiologist [facility] Radiology   Original Report Authenticated By: Malachy Moan, M.D.   Ir US Guide Vasc Access Left  10/01/2012   *RADIOLOGY REPORT*  PICC PLACEMENT WITH ULTRASOUND AND FLUOROSCOPIC  GUIDANCE  Clinical History: 51-year female with anal carcinoma in need of  central venous access for chemotherapy.  Fluoroscopy Time: 1 minute 0 seconds minutes.  Procedure:  The left arm was prepped with chlorhexidine, draped in the usual sterile fashion using maximum barrier technique (cap and mask, sterile gown, sterile gloves, large sterile sheet, hand hygiene and cutaneous antiseptic).  Local anesthesia was attained by infiltration with 1% lidocaine.  Ultrasound demonstrated patency of the left basilic vein, and this was documented with an image.  Under real-time ultrasound guidance, this vein was accessed with a 21 gauge micropuncture needle and image documentation was performed.  The needle was exchanged over a guidewire for a peel-away sheath through which a 40 cm 5 [place] dual lumen power injectable PICC was advanced, and positioned with its tip at the lower SVC/right atrial junction.  Fluoroscopy during the procedure and fluoro spot  radiograph confirms appropriate catheter position.  The catheter was flushed, secured to the skin with Prolene sutures, and covered with a sterile dressing.  Complications:  None.  The patient tolerated the procedure well.  IMPRESSION: Successful placement of a left basilic vein approach 40 cm dual lumen power PICC with sonographic and fluoroscopic guidance.  The catheter is ready for use.  Signed,  Sterling Big, MD Vascular & Interventional Radiologist [facility] Radiology   Original Report Authenticated By: Malachy Moan, M.D.    Impression:    I am pleased with how the patient has done since she finished treatment. She is healing adequately. She will continue to use Silvadene for approximately one more week, then other moisturizing creams would be fine.  Plan:  Followup in 3 months. I have also ordered a CT scan of the chest in one year as recommended based on likely benign findings within the chest. Her recent CT scan of the chest was not concerning for metastatic disease or other malignancy although followup is recommended.   Radene Gunning, M.D., Ph.D.

## 2012-10-29 NOTE — Progress Notes (Signed)
  Radiation Oncology         (336) [phone] ________________________________  Name: Gina Cervantes MRN: [id number]  Date: 10/10/2012  DOB: 05/03/1961  End of Treatment Note  Diagnosis:   Anal cancer     Indication for treatment:  Curative       Radiation treatment dates:   09/02/2012 through 10/10/2012  Site/dose:   The patient was treated to the anal region and high risk region. This was carried out using a IMRT technique with daily image guidance.  The patient received a total of 50.4 gray at 1.8 gray per fraction. The patient was initially treated using daily image guidance on a standard linear accelerator, and then she was treated on tomotherapy after the first week of treatment.   Narrative: The patient tolerated radiation treatment relatively well.   The patient had substantial irritative symptoms as expected, most prominently irritation of the anal region and inguinal regions. The patient's skin did satisfactorily and was expected to heal within several weeks quite well.  Plan: The patient has completed radiation treatment. The patient will return to radiation oncology clinic for routine followup in one month. I advised the patient to call or return sooner if they have any questions or concerns related to their recovery or treatment. ________________________________  Radene Gunning, M.D., Ph.D.

## 2012-11-13 ENCOUNTER — Telehealth: Payer: Self-pay | Admitting: Oncology

## 2012-11-13 NOTE — Telephone Encounter (Signed)
Received a call from [facility].  She stated that she had 3 small bumps on her anal region that were like pimples.  She stated that they were sore at first but are not now and are reducing in size.  She was wondering if this is normal.  Chrystie Nose that they may be due to the heat and to apply antibiotic ointment to them.  Told her to call back if the get larger or start draining fluid.  She agreed with this.

## 2012-11-28 ENCOUNTER — Telehealth: Payer: Self-pay | Admitting: Oncology

## 2012-11-28 NOTE — Telephone Encounter (Signed)
KK PAL moved from 7/28 to 7/29 - date per KK. lmonvm for pt and mailed schedule.

## 2012-12-02 ENCOUNTER — Ambulatory Visit: Payer: BC Managed Care – PPO | Admitting: Oncology

## 2012-12-02 ENCOUNTER — Other Ambulatory Visit: Payer: BC Managed Care – PPO | Admitting: Lab

## 2012-12-03 ENCOUNTER — Encounter: Payer: Self-pay | Admitting: Oncology

## 2012-12-03 ENCOUNTER — Ambulatory Visit (HOSPITAL_BASED_OUTPATIENT_CLINIC_OR_DEPARTMENT_OTHER): Payer: BC Managed Care – PPO | Admitting: Oncology

## 2012-12-03 ENCOUNTER — Other Ambulatory Visit (HOSPITAL_BASED_OUTPATIENT_CLINIC_OR_DEPARTMENT_OTHER): Payer: BC Managed Care – PPO

## 2012-12-03 DIAGNOSIS — C50911 Malignant neoplasm of unspecified site of right female breast: Secondary | ICD-10-CM

## 2012-12-03 DIAGNOSIS — C210 Malignant neoplasm of anus, unspecified: Secondary | ICD-10-CM

## 2012-12-03 DIAGNOSIS — C50919 Malignant neoplasm of unspecified site of unspecified female breast: Secondary | ICD-10-CM

## 2012-12-03 DIAGNOSIS — E559 Vitamin D deficiency, unspecified: Secondary | ICD-10-CM

## 2012-12-03 LAB — CBC WITH DIFFERENTIAL/PLATELET
BASO%: 0.8 % (ref 0.0–2.0)
Basophils Absolute: 0 10*3/uL (ref 0.0–0.1)
EOS%: 3.9 % (ref 0.0–7.0)
Eosinophils Absolute: 0.2 10*3/uL (ref 0.0–0.5)
HCT: 37.4 % (ref 34.8–46.6)
HGB: 12.8 g/dL (ref 11.6–15.9)
LYMPH%: 11.3 % — ABNORMAL LOW (ref 14.0–49.7)
MCH: 36.7 pg — ABNORMAL HIGH (ref 25.1–34.0)
MCHC: 34.3 g/dL (ref 31.5–36.0)
MCV: 107 fL — ABNORMAL HIGH (ref 79.5–101.0)
MONO#: 0.6 10*3/uL (ref 0.1–0.9)
MONO%: 12.2 % (ref 0.0–14.0)
NEUT#: 3.8 10*3/uL (ref 1.5–6.5)
NEUT%: 71.8 % (ref 38.4–76.8)
Platelets: 240 10*3/uL (ref 145–400)
RBC: 3.5 10*6/uL — ABNORMAL LOW (ref 3.70–5.45)
RDW: 16.5 % — ABNORMAL HIGH (ref 11.2–14.5)
WBC: 5.2 10*3/uL (ref 3.9–10.3)
lymph#: 0.6 10*3/uL — ABNORMAL LOW (ref 0.9–3.3)

## 2012-12-03 LAB — COMPREHENSIVE METABOLIC PANEL (CC13)
ALT: 33 U/L (ref 0–55)
AST: 28 U/L (ref 5–34)
Albumin: 3.3 g/dL — ABNORMAL LOW (ref 3.5–5.0)
Alkaline Phosphatase: 51 U/L (ref 40–150)
BUN: 10.8 mg/dL (ref 7.0–26.0)
CO2: 27 mEq/L (ref 22–29)
Calcium: 8.9 mg/dL (ref 8.4–10.4)
Chloride: 103 mEq/L (ref 98–109)
Creatinine: 0.7 mg/dL (ref 0.6–1.1)
Glucose: 101 mg/dl (ref 70–140)
Potassium: 4.3 mEq/L (ref 3.5–5.1)
Sodium: 139 mEq/L (ref 136–145)
Total Bilirubin: 0.48 mg/dL (ref 0.20–1.20)
Total Protein: 6.7 g/dL (ref 6.4–8.3)

## 2012-12-03 NOTE — Progress Notes (Signed)
Patient was seen today in followup for breast cancer. However she also has a history of anal carcinoma. This was treated by Dr. Mancel Bale and Dr. Dorothy Puffer. At this time my recommendation is for patient to continue to be followed by Dr. Truett Perna.  She will continue Arimidex 1 mg daily for another 2-3 years. I will let Dr. Truett Perna manage this at this point. Patient is in agreement with this. She does not need to come back and see me. Melvenia Beam has an appointment set up to be seen by Dr. Truett Perna at the end of August.  Drue Second, MD Medical/Oncology [facility] [phone] (beeper) [phone] (Office)  12/03/2012, 2:30 PM

## 2012-12-04 LAB — VITAMIN D 25 HYDROXY (VIT D DEFICIENCY, FRACTURES): Vit D, 25-Hydroxy: 59 ng/mL (ref 30–89)

## 2012-12-18 ENCOUNTER — Encounter (INDEPENDENT_AMBULATORY_CARE_PROVIDER_SITE_OTHER): Payer: Self-pay | Admitting: General Surgery

## 2012-12-18 ENCOUNTER — Ambulatory Visit (INDEPENDENT_AMBULATORY_CARE_PROVIDER_SITE_OTHER): Payer: BC Managed Care – PPO | Admitting: General Surgery

## 2012-12-18 VITALS — BP 100/58 | HR 110 | Temp 97.5°F | Ht 65.5 in | Wt 156.6 lb

## 2012-12-18 DIAGNOSIS — Z86004 Personal history of in-situ neoplasm of other and unspecified digestive organs: Secondary | ICD-10-CM

## 2012-12-18 DIAGNOSIS — Z85048 Personal history of other malignant neoplasm of rectum, rectosigmoid junction, and anus: Secondary | ICD-10-CM

## 2012-12-18 NOTE — Patient Instructions (Signed)
Inspect and examine your self every other week as we discussed.

## 2012-12-18 NOTE — Progress Notes (Signed)
Patient ID: Gina Cervantes, female   DOB: Oct 10, 1960, 52 y.o.   MRN: [id number]  Chief Complaint  Patient presents with  . Follow-up    reck anal    HPI Gina Cervantes is a 52 y.o. female.   HPI  She is here for followup visit after chemoradiation for her squamous cell carcinoma of the anus. She feels good. She has good continence over all. She's not felt any abnormalities in the area.  Past Medical History  Diagnosis Date  . Pain     RECTAL PAIN - PT HAS ANAL MASS--NO BLEEDING  . Anxiety     PANIC ATTACKS  . Pneumonia     NO RECENT PROBLEMS  . Status post chemotherapy 02/2008    Taxol/Herceptin  . Radiation 10/21/08-12/08/08    Right breast 6240 cGy  . HPV (human papilloma virus) anogenital infection     vulvar/ freezing hx  . Breast cancer 2009    ER+PR+HER-2+  . Anal cancer 08/09/2012    Squamous cell  . Ductal carcinoma 02/2008     invasive   . History of radiation therapy     Past Surgical History  Procedure Laterality Date  . Breast lumpectomy with axillary lymph node biopsy  2010    RIGHT  . Cesarean section    . Portacath placement  2009  . Removal portacath  2011  . Evaluation under anesthesia with anal fistulectomy N/A 08/09/2012    Procedure: EXAM UNDER ANESTHESIA AND BIOPSY OF ANAL MASS;  Surgeon: Adolph Pollack, MD;  Location: [facility];  Service: General;  Laterality: N/A;  . Breast surgery    . Breast lumpectomy  09/2008    dr. Carolynne Edouard   . Porta cath    . Portacath placement  03/2008    dr. Carolynne Edouard     Family History  Problem Relation Age of Onset  . Lung cancer Father   . Stroke Mother     Social History History  Substance Use Topics  . Smoking status: Current Some Day Smoker -- 8 years    Types: Cigarettes  . Smokeless tobacco: Never Used     Comment: ONE TO 4 CIGARETTES A DAY--USED TO SMOKE 1 PPD    . Alcohol Use: 3.0 oz/week    5 Glasses of wine per week    No Known Allergies  Current Outpatient Prescriptions  Medication Sig Dispense  Refill  . anastrozole (ARIMIDEX) 1 MG tablet Take 1 mg by mouth daily. TAKES AT BEDTIME      . calcium-vitamin D 250-100 MG-UNIT per tablet Take 1 tablet by mouth daily.       . Cholecalciferol (VITAMIN D) 1000 UNITS capsule Take 1,000 Units by mouth daily.      Marland Kitchen ibuprofen (ADVIL,MOTRIN) 200 MG tablet Take 200 mg by mouth every 6 (six) hours as needed for pain. Take before bed      . LORazepam (ATIVAN) 0.5 MG tablet Take 1 tablet (0.5 mg total) by mouth every 6 (six) hours as needed for anxiety (or sleep).  30 tablet  2  . Multiple Vitamins-Minerals (MULTIVITAMIN WITH MINERALS) tablet Take 1 tablet by mouth daily.       No current facility-administered medications for this visit.    Review of Systems Review of Systems  Constitutional: Negative.   Gastrointestinal: Negative for anal bleeding and rectal pain.    Blood pressure 100/58, pulse 110, temperature 97.5 F (36.4 C), temperature source Temporal, height 5' 5.5" (1.664 m), weight 156 lb  9.6 oz (71.033 kg), last menstrual period 04/07/2008, SpO2 90.00%.  Physical Exam Physical Exam  Constitutional: She appears well-developed and well-nourished.  Genitourinary:  Anorectal-external scar 3:00 to 5:00 position with no nodularity. Small benign skin tag. No condyloma. On digital rectal exam no masses are felt.  Anoscopy demonstrates nice internal scar but no evidence of mass.  Musculoskeletal:  No inguinal adenopathy.    Data Reviewed Notes in EPIC  Assessment    Invasive squamous cell carcinoma anus status post irradiation therapy. Response appears excellent clinically. No evidence of persistent mass.     Plan    Self inspection every other week. Return visit 3 months.        Marcelle Hepner J 12/18/2012, 10:38 AM

## 2013-01-02 ENCOUNTER — Ambulatory Visit (HOSPITAL_BASED_OUTPATIENT_CLINIC_OR_DEPARTMENT_OTHER): Payer: BC Managed Care – PPO | Admitting: Oncology

## 2013-01-02 ENCOUNTER — Other Ambulatory Visit (HOSPITAL_BASED_OUTPATIENT_CLINIC_OR_DEPARTMENT_OTHER): Payer: BC Managed Care – PPO

## 2013-01-02 VITALS — BP 109/60 | HR 107 | Temp 98.9°F | Resp 20 | Ht 65.0 in | Wt 157.7 lb

## 2013-01-02 DIAGNOSIS — C210 Malignant neoplasm of anus, unspecified: Secondary | ICD-10-CM

## 2013-01-02 DIAGNOSIS — F411 Generalized anxiety disorder: Secondary | ICD-10-CM

## 2013-01-02 DIAGNOSIS — C50119 Malignant neoplasm of central portion of unspecified female breast: Secondary | ICD-10-CM

## 2013-01-02 DIAGNOSIS — R918 Other nonspecific abnormal finding of lung field: Secondary | ICD-10-CM

## 2013-01-02 DIAGNOSIS — Z853 Personal history of malignant neoplasm of breast: Secondary | ICD-10-CM

## 2013-01-02 DIAGNOSIS — F17200 Nicotine dependence, unspecified, uncomplicated: Secondary | ICD-10-CM

## 2013-01-02 LAB — CBC WITH DIFFERENTIAL/PLATELET
BASO%: 0.7 % (ref 0.0–2.0)
Basophils Absolute: 0 10*3/uL (ref 0.0–0.1)
EOS%: 3.3 % (ref 0.0–7.0)
Eosinophils Absolute: 0.2 10*3/uL (ref 0.0–0.5)
HCT: 39.1 % (ref 34.8–46.6)
HGB: 13.5 g/dL (ref 11.6–15.9)
LYMPH%: 31.8 % (ref 14.0–49.7)
MCH: 36.6 pg — ABNORMAL HIGH (ref 25.1–34.0)
MCHC: 34.6 g/dL (ref 31.5–36.0)
MCV: 105.7 fL — ABNORMAL HIGH (ref 79.5–101.0)
MONO#: 0.7 10*3/uL (ref 0.1–0.9)
MONO%: 12.8 % (ref 0.0–14.0)
NEUT#: 2.6 10*3/uL (ref 1.5–6.5)
NEUT%: 51.4 % (ref 38.4–76.8)
Platelets: 240 10*3/uL (ref 145–400)
RBC: 3.7 10*6/uL (ref 3.70–5.45)
RDW: 13.6 % (ref 11.2–14.5)
WBC: 5.1 10*3/uL (ref 3.9–10.3)
lymph#: 1.6 10*3/uL (ref 0.9–3.3)

## 2013-01-02 NOTE — Progress Notes (Signed)
   [facility]   .Marland KitchenOFFICE PROGRESS NOTE   INTERVAL HISTORY:  Scheduled she feels well. She a colonoscopy yesterday by Dr. Loreta Ave. Polyps were removed from the colon and rectum. Healed scar was noted at the anal verge. She saw Dr. Abbey Chatters on 12/18/2012. An anal examination including anoscopy revealed no evidence of anal cancer.  She continues Arimidex. No hot flashes or arthralgias. No change over either breast.  Objective:  Vital signs in last 24 hours:  Blood pressure 109/60, pulse 107, temperature 98.9 F (37.2 C), temperature source Oral, resp. rate 20, height 5\' 5"  (1.651 m), weight 157 lb 11.2 oz (71.532 kg), last menstrual period 04/07/2008.    HEENT: Neck without mass Lymphatics: No cervical, supra-clavicular, axillary, or inguinal nodes Resp: Lungs clear bilaterally Cardio: Regular rate and rhythm GI: No hepatomegaly, no mass Vascular: No leg edema  Skin: Hyperpigmentation over the nail beds and in the groin     Lab Results:  Lab Results  Component Value Date   WBC 5.1 01/02/2013   HGB 13.5 01/02/2013   HCT 39.1 01/02/2013   MCV 105.7* 01/02/2013   PLT 240 01/02/2013   ANC 2.6    Medications: I have reviewed the patient's current medications.  Assessment/Plan: 1.Anal cancer-left anal margin/anal canal mass, status post a biopsy on 08/09/2012 confirming invasive moderately differentiated squamous cell carcinoma,p16 positive.  -Staging PET scan 08/20/2012-hypermetabolic anal mass, no hypermetabolic metastases  -Initiation of concurrent radiation with cycle 1 of mitomycin C./5-fluorouracil 09/02/2012.  -She completed cycle 2 5-fluorouracil/mitomycin C. beginning 10/01/2012.  -She completed radiation 10/10/2012.  2. Right breast cancer 2009, ER positive, PR positive, HER-2 positive. She is status post neoadjuvant AC x4 cycles followed by Taxol/Herceptin. She underwent a right lumpectomy and sentinel lymph node biopsy 09/22/2008 with no evidence of  residual invasive carcinoma and 5 negative sentinel lymph nodes. She then completed right breast radiation. She began tamoxifen 12/15/2008 and completed adjuvant Herceptin 05/25/2009. The tamoxifen was discontinued and she was switched to Arimidex in July 2013.  3. Anxiety secondary to the new diagnosis of anal cancer-she was given a prescription for Ativan to use as needed.  4. Ongoing tobacco use.  5. Enlargement of the right tonsil.  6. Nonspecific pulmonary nodules without hypermetabolic activity on the PET scan 08/20/2012. Chest CT 10/21/2012 with scattered pulmonary nodules including nodules that were not present in 2010. Annual surveillance was recommended.   Disposition:  She remains in clinical remission from anal cancer and breast cancer. She continues Arimidex. Ms. Rosalie Gums will return for an office visit in 6 months. She will see Dr. Abbey Chatters and Dr. Mitzi Hansen in the interim.   Thornton Papas, MD  01/02/2013  4:55 PM

## 2013-01-03 ENCOUNTER — Telehealth: Payer: Self-pay

## 2013-01-03 ENCOUNTER — Encounter: Payer: Self-pay | Admitting: *Deleted

## 2013-01-03 NOTE — Progress Notes (Signed)
Spoke with pt at [facility] 01/02/13.  Gave and explained smoking cessation information

## 2013-01-03 NOTE — Telephone Encounter (Signed)
lvm for pt regarding to Aug 2015 appt...mailed pt appt sched avs and letter for Aug

## 2013-01-23 ENCOUNTER — Ambulatory Visit: Payer: BC Managed Care – PPO | Admitting: Radiation Oncology

## 2013-01-28 ENCOUNTER — Encounter: Payer: Self-pay | Admitting: Radiation Oncology

## 2013-01-28 ENCOUNTER — Ambulatory Visit: Payer: BC Managed Care – PPO | Admitting: Radiation Oncology

## 2013-01-30 ENCOUNTER — Ambulatory Visit
Admission: RE | Admit: 2013-01-30 | Discharge: 2013-01-30 | Disposition: A | Discharge status: Home / Self Care | Payer: BC Managed Care – PPO | Source: Ambulatory Visit | Attending: Radiation Oncology | Admitting: Radiation Oncology

## 2013-01-30 DIAGNOSIS — C210 Malignant neoplasm of anus, unspecified: Secondary | ICD-10-CM

## 2013-01-30 NOTE — Progress Notes (Signed)
  Radiation Oncology         [phone] ________________________________  Name: Gina Cervantes MRN: [id number]  Date: 01/30/2013  DOB: 1961-04-19  Follow-Up Visit Note  CC: No PCP Per Patient  Pierce Crane, MD  Diagnosis:   Squamous cell carcinoma of the anal canal  Interval Since Last Radiation:  4 months   Narrative:  The patient returns today for routine follow-up.  The patient states that she has done relatively well she was last seen. She had a colonoscopy last month and had several polyps removed. According to the patient these returned benign which she was very pleased with. The patient also had evaluation through Dr. Abbey Chatters with a benign exam. She is pleased with how her skin has healed. She has normal bowel movements. She is notice some stiffness in the hips which is really her major complaint today. As she feels is related to some back pain she has had over the same time.                              ALLERGIES:  has No Known Allergies.  Meds: Current Outpatient Prescriptions  Medication Sig Dispense Refill  . anastrozole (ARIMIDEX) 1 MG tablet Take 1 mg by mouth daily. TAKES AT BEDTIME      . calcium-vitamin D 250-100 MG-UNIT per tablet Take 1 tablet by mouth daily.       . Cholecalciferol (VITAMIN D) 1000 UNITS capsule Take 1,000 Units by mouth daily.      . Multiple Vitamins-Minerals (MULTIVITAMIN WITH MINERALS) tablet Take 1 tablet by mouth daily.      Marland Kitchen LORazepam (ATIVAN) 0.5 MG tablet Take 1 tablet (0.5 mg total) by mouth every 6 (six) hours as needed for anxiety (or sleep).  30 tablet  2   No current facility-administered medications for this encounter.    Physical Findings: The patient is in no acute distress. Patient is alert and oriented.  vitals were not taken for this visit..   The anal region has healed very well. Some hyperpigmentation present.   No nodularity/suspicious masses on digital rectal exam. No blood on exam glove.    Lab Findings: Lab  Results  Component Value Date   WBC 5.1 01/02/2013   HGB 13.5 01/02/2013   HCT 39.1 01/02/2013   MCV 105.7* 01/02/2013   PLT 240 01/02/2013     Radiographic Findings: No results found.  Impression:    The patient is doing satisfactorily I believe at this time. Her skin looks good with no suspicious findings on clinical exam. The patient had some tightness in the hips and we discussed trying to continue her regular activities including yoga. If this persists or worsens she is to let us know. She may benefit at that point from physical therapy although I believe that this is something that we'll hopefully pass.  Plan:  We will stagger her visits with her other existing medical appointments and have her return to clinic in 7 months.  I spent 15 minutes with the patient today, the majority of which was spent counseling the patient on the diagnosis of cancer and coordinating care.   Radene Gunning, M.D., Ph.D.

## 2013-01-30 NOTE — Progress Notes (Signed)
Follow up anal ca rad tx=09/02/12-10/10/12, had colonoscopy last month with 3 polyps removed,  Path came back clean stated patient Dr.Mann, normal bowel movements, just past couple weeks, no nausea, appetite good, energy level,  Up and down stated, "emotional stuff in my world",today is better stated 2:18 PM

## 2013-02-04 ENCOUNTER — Encounter (INDEPENDENT_AMBULATORY_CARE_PROVIDER_SITE_OTHER): Payer: Self-pay

## 2013-02-07 ENCOUNTER — Encounter: Payer: Self-pay | Admitting: Oncology

## 2013-02-18 ENCOUNTER — Other Ambulatory Visit: Payer: Self-pay | Admitting: Orthopedic Surgery

## 2013-02-18 DIAGNOSIS — M25562 Pain in left knee: Secondary | ICD-10-CM

## 2013-02-19 ENCOUNTER — Ambulatory Visit
Admission: RE | Admit: 2013-02-19 | Discharge: 2013-02-19 | Disposition: A | Discharge status: Home / Self Care | Payer: BC Managed Care – PPO | Source: Ambulatory Visit | Attending: Orthopedic Surgery | Admitting: Orthopedic Surgery

## 2013-02-19 DIAGNOSIS — M25562 Pain in left knee: Secondary | ICD-10-CM

## 2013-02-20 NOTE — Addendum Note (Signed)
Encounter addended by: Jonna Coup, MD on: 02/20/2013  8:28 PM<BR>     Documentation filed: Visit Diagnoses

## 2013-03-07 ENCOUNTER — Telehealth: Payer: Self-pay | Admitting: Dietician

## 2013-03-19 ENCOUNTER — Encounter (INDEPENDENT_AMBULATORY_CARE_PROVIDER_SITE_OTHER): Payer: Self-pay | Admitting: General Surgery

## 2013-03-19 ENCOUNTER — Ambulatory Visit (INDEPENDENT_AMBULATORY_CARE_PROVIDER_SITE_OTHER): Payer: BC Managed Care – PPO | Admitting: General Surgery

## 2013-03-19 VITALS — BP 108/62 | HR 64 | Temp 97.4°F | Resp 14 | Ht 65.5 in | Wt 152.2 lb

## 2013-03-19 DIAGNOSIS — C210 Malignant neoplasm of anus, unspecified: Secondary | ICD-10-CM

## 2013-03-19 NOTE — Progress Notes (Signed)
Patient ID: Gina Cervantes, female   DOB: 02/25/1961, 52 y.o.   MRN: [id number]  Chief Complaint  Patient presents with  . Follow-up    3 mo f/u invasive squamous cell carcinoma    HPI Gina Cervantes is a 52 y.o. female.   HPI  She is here for followup visit after chemoradiation for her squamous cell carcinoma of the anus. She has no complaints. No rectal bleeding. No rectal pain. She had a knee arthroscopy recently and is doing well from this.  Past Medical History  Diagnosis Date  . Pain     RECTAL PAIN - PT HAS ANAL MASS--NO BLEEDING  . Anxiety     PANIC ATTACKS  . Pneumonia     NO RECENT PROBLEMS  . Status post chemotherapy 02/2008    Taxol/Herceptin  . Radiation 10/21/08-12/08/08    Right breast 6240 cGy  . HPV (human papilloma virus) anogenital infection     vulvar/ freezing hx  . Breast cancer 2009    ER+PR+HER-2+  . Anal cancer 08/09/2012    Squamous cell  . Ductal carcinoma 02/2008     invasive   . History of radiation therapy 09/02/12-10/10/12    anal 50.4Gy    Past Surgical History  Procedure Laterality Date  . Breast lumpectomy with axillary lymph node biopsy  2010    RIGHT  . Cesarean section    . Portacath placement  2009  . Removal portacath  2011  . Evaluation under anesthesia with anal fistulectomy N/A 08/09/2012    Procedure: EXAM UNDER ANESTHESIA AND BIOPSY OF ANAL MASS;  Surgeon: Adolph Pollack, MD;  Location: [facility];  Service: General;  Laterality: N/A;  . Breast surgery    . Breast lumpectomy  09/2008    dr. Carolynne Edouard   . Porta cath    . Portacath placement  03/2008    dr. Carolynne Edouard   . Knee arthroscopy      Family History  Problem Relation Age of Onset  . Lung cancer Father   . Stroke Mother     Social History History  Substance Use Topics  . Smoking status: Current Some Day Smoker -- 8 years    Types: Cigarettes  . Smokeless tobacco: Never Used     Comment: ONE TO 4 CIGARETTES A DAY--USED TO SMOKE 1 PPD    . Alcohol Use: 3.0 oz/week    5  Glasses of wine per week    No Known Allergies  Current Outpatient Prescriptions  Medication Sig Dispense Refill  . anastrozole (ARIMIDEX) 1 MG tablet Take 1 mg by mouth daily. TAKES AT BEDTIME      . calcium-vitamin D 250-100 MG-UNIT per tablet Take 1 tablet by mouth daily.       . Cholecalciferol (VITAMIN D) 1000 UNITS capsule Take 1,000 Units by mouth daily.      Marland Kitchen LORazepam (ATIVAN) 0.5 MG tablet Take 1 tablet (0.5 mg total) by mouth every 6 (six) hours as needed for anxiety (or sleep).  30 tablet  2  . meloxicam (MOBIC) 15 MG tablet       . Multiple Vitamins-Minerals (MULTIVITAMIN WITH MINERALS) tablet Take 1 tablet by mouth daily.       No current facility-administered medications for this visit.    Review of Systems Review of Systems  Constitutional: Negative.   Gastrointestinal: Negative for anal bleeding and rectal pain.    Blood pressure 108/62, pulse 64, temperature 97.4 F (36.3 C), temperature source Temporal, resp. rate  14, height 5' 5.5" (1.664 m), weight 152 lb 3.2 oz (69.037 kg), last menstrual period 04/07/2008.  Physical Exam Physical Exam  Constitutional: She appears well-developed and well-nourished.  Genitourinary:  Anorectal-external scar 3:00 to 5:00 position with no nodularity. Small benign skin tag. No condyloma. On digital rectal exam no masses are felt.  Anoscopy demonstrates a left sided internal scar but no evidence of mass.  No inguinal adenopathy.  Musculoskeletal:  No inguinal adenopathy.    Assessment    Invasive squamous cell carcinoma anus status post chemoradiation therapy-no clinical evidence of recurrence. Plan     Return visit 3 months.        Naoki Migliaccio J 03/19/2013, 10:22 AM

## 2013-03-19 NOTE — Patient Instructions (Signed)
Call if you have persistent rectal bleeding.

## 2013-03-21 ENCOUNTER — Encounter (INDEPENDENT_AMBULATORY_CARE_PROVIDER_SITE_OTHER): Payer: Self-pay

## 2013-05-27 ENCOUNTER — Encounter (INDEPENDENT_AMBULATORY_CARE_PROVIDER_SITE_OTHER): Payer: Self-pay | Admitting: General Surgery

## 2013-06-11 ENCOUNTER — Ambulatory Visit (INDEPENDENT_AMBULATORY_CARE_PROVIDER_SITE_OTHER): Payer: BC Managed Care – PPO | Admitting: General Surgery

## 2013-06-17 ENCOUNTER — Ambulatory Visit (INDEPENDENT_AMBULATORY_CARE_PROVIDER_SITE_OTHER): Payer: BC Managed Care – PPO | Admitting: General Surgery

## 2013-06-17 DIAGNOSIS — C210 Malignant neoplasm of anus, unspecified: Secondary | ICD-10-CM

## 2013-06-17 NOTE — Progress Notes (Signed)
Procedure:  Biopsy of anal mass  Date:  08/19/2012  Pathology: Invasive squamous cell carcinoma  Hx:  She is here for another followup visit. She underwent chemotherapy and radiation therapy. Her last visit was 3 months ago and there was no evidence of recurrence. She has no complaints. She does not have incontinence.  PE: Gina Cervantes looks well and is in no acute distress.  Anorectal-Left lateral scars noted extending posteriorly. No suspicious perianal lesions. On digital rectal exam no masses are felt.  Anoscopy-scar noted at the 3 to 5:00 position. No masses noted.  Assessment:  Invasive squamous cell carcinoma of the anus-no clinical evidence of recurrence.  Plan:  Return visit in 3 months.

## 2013-06-17 NOTE — Patient Instructions (Signed)
Recommended followup is every 3-6 months for 5 years for your condition.

## 2013-06-30 ENCOUNTER — Ambulatory Visit (HOSPITAL_BASED_OUTPATIENT_CLINIC_OR_DEPARTMENT_OTHER): Payer: BC Managed Care – PPO | Admitting: Oncology

## 2013-06-30 ENCOUNTER — Telehealth: Payer: Self-pay | Admitting: *Deleted

## 2013-06-30 ENCOUNTER — Other Ambulatory Visit: Payer: Self-pay | Admitting: *Deleted

## 2013-06-30 VITALS — BP 106/45 | HR 80 | Temp 98.0°F | Resp 18 | Ht 65.5 in | Wt 158.5 lb

## 2013-06-30 DIAGNOSIS — D490 Neoplasm of unspecified behavior of digestive system: Secondary | ICD-10-CM

## 2013-06-30 DIAGNOSIS — C210 Malignant neoplasm of anus, unspecified: Secondary | ICD-10-CM

## 2013-06-30 DIAGNOSIS — C50911 Malignant neoplasm of unspecified site of right female breast: Secondary | ICD-10-CM

## 2013-06-30 DIAGNOSIS — R918 Other nonspecific abnormal finding of lung field: Secondary | ICD-10-CM

## 2013-06-30 DIAGNOSIS — C50919 Malignant neoplasm of unspecified site of unspecified female breast: Secondary | ICD-10-CM

## 2013-06-30 MED ORDER — LORAZEPAM 0.5 MG PO TABS: 0.5000 mg | ORAL_TABLET | Freq: Every evening | ORAL | Status: DC | PRN

## 2013-06-30 NOTE — Telephone Encounter (Signed)
Called to remind her she is due for chest CT in June to follow up lung nodules per Dr. Benay Spice. She was aware. It is already scheduled.

## 2013-06-30 NOTE — Telephone Encounter (Signed)
Lm gv appts for mammo on 09/26/13@ 10:30am. i also gv appt for 12/29/13 w/ labs@ 10:30am and ov@ 11am. Made the pt aware that cs will call w/ appt for CT chest. Pt is also aware that i will mail a letter/avs...td

## 2013-06-30 NOTE — Progress Notes (Signed)
   [place]    OFFICE PROGRESS NOTE   INTERVAL HISTORY:   She returns for scheduled followup of anal cancer and breast cancer. She continues Arimidex. Minimal hot flashes. No arthralgias. No difficulty with bowel function. She saw Dr. Zella Richer on 06/17/2013 and there was no anal mass. She quit smoking 2 months ago.  Objective:  Vital signs in last 24 hours:  Blood pressure 106/45, pulse 80, temperature 98 F (36.7 C), temperature source Oral, resp. rate 18, height 5' 5.5" (1.664 m), weight 158 lb 8 oz (71.895 kg), last menstrual period 04/07/2008.    HEENT: Neck without mass Lymphatics: No cervical, supraclavicular, axillary, or inguinal nodes Resp: Lungs clear bilaterally Cardio: Regular rate and rhythm GI: No hepatomegaly, nontender, no mass Vascular: No leg edema Breast: Status post right lumpectomy. No evidence for local tumor recurrence. No mass in either breast      Medications: I have reviewed the patient's current medications.  Assessment/Plan: 1.Anal cancer-left anal margin/anal canal mass, status post a biopsy on 08/09/2012 confirming invasive moderately differentiated squamous cell carcinoma,p16 positive.  -Staging PET scan 08/20/2012-hypermetabolic anal mass, no hypermetabolic metastases  -Initiation of concurrent radiation with cycle 1 of mitomycin C./5-fluorouracil 09/02/2012.  -She completed cycle 2 5-fluorouracil/mitomycin C. beginning 10/01/2012.  -She completed radiation 10/10/2012.  2. Right breast cancer 2009, ER positive, PR positive, HER-2 positive. She is status post neoadjuvant AC x4 cycles followed by Taxol/Herceptin. She underwent a right lumpectomy and sentinel lymph node biopsy 09/22/2008 with no evidence of residual invasive carcinoma and 5 negative sentinel lymph nodes. She then completed right breast radiation. She began tamoxifen 12/15/2008 and completed adjuvant Herceptin 05/25/2009. The tamoxifen was discontinued and she was  switched to Arimidex in July 2013.  3. history of Enlargement of the right tonsil.  4. Nonspecific pulmonary nodules without hypermetabolic activity on the PET scan 08/20/2012. Chest CT 10/21/2012 with scattered pulmonary nodules including nodules that were not present in 2010. Annual surveillance was recommended.   Disposition:  She remains in clinical remission from anal cancer and breast cancer. She will continue Arimidex. She will be scheduled for a mammogram in May. Ms. Hopf will return for an office visit in 6 months. She will continue anal examinations with Dr. Zella Richer.  She will be scheduled for a one-year surveillance chest CT to followup on the lung nodules.   Betsy Coder, MD  06/30/2013  12:59 PM

## 2013-07-13 ENCOUNTER — Other Ambulatory Visit: Payer: Self-pay | Admitting: Oncology

## 2013-08-27 ENCOUNTER — Encounter: Payer: Self-pay | Admitting: Radiation Oncology

## 2013-08-27 ENCOUNTER — Telehealth: Payer: Self-pay | Admitting: *Deleted

## 2013-08-27 ENCOUNTER — Ambulatory Visit
Admission: RE | Admit: 2013-08-27 | Discharge: 2013-08-27 | Disposition: A | Discharge status: Home / Self Care | Payer: BC Managed Care – PPO | Source: Ambulatory Visit | Attending: Radiation Oncology | Admitting: Radiation Oncology

## 2013-08-27 NOTE — Progress Notes (Addendum)
Pt denies pain, fatigue, loss of appetite, bowel issues, rectal bleeding. She does report bowel urgency, no incontinence.  Dr Lisbeth Renshaw in breast clinic when pt was ready to be seen. Discussed w/pt, and she stated she could not wait due to an appointment in her office. She requested to see Dr Lisbeth Renshaw for FU after ct scan in June 2015. Discussed w/Dr Lisbeth Renshaw who approved this plan. Pt rescheduled for FU w/Dr Lisbeth Renshaw on 10/30/13, 4 pm. Pt informed of this appointment by Verdell Carmine, medical secretary.

## 2013-08-27 NOTE — Telephone Encounter (Signed)
CALLED PATIENT TO INFORM OF FU APPT. ON 10-30-13 @ 4 PM, SPOKE WITH PATIENT AND SHE IS AWARE OF THIS APPT.

## 2013-09-04 ENCOUNTER — Encounter (INDEPENDENT_AMBULATORY_CARE_PROVIDER_SITE_OTHER): Payer: Self-pay | Admitting: General Surgery

## 2013-09-26 ENCOUNTER — Ambulatory Visit
Admission: RE | Admit: 2013-09-26 | Discharge: 2013-09-26 | Disposition: A | Discharge status: Home / Self Care | Payer: BC Managed Care – PPO | Source: Ambulatory Visit | Attending: Oncology | Admitting: Oncology

## 2013-09-26 DIAGNOSIS — C50911 Malignant neoplasm of unspecified site of right female breast: Secondary | ICD-10-CM

## 2013-10-07 ENCOUNTER — Encounter (INDEPENDENT_AMBULATORY_CARE_PROVIDER_SITE_OTHER): Payer: Self-pay | Admitting: General Surgery

## 2013-10-07 ENCOUNTER — Ambulatory Visit (INDEPENDENT_AMBULATORY_CARE_PROVIDER_SITE_OTHER): Payer: BC Managed Care – PPO | Admitting: General Surgery

## 2013-10-07 VITALS — BP 118/60 | HR 80 | Resp 14 | Ht 65.5 in | Wt 159.2 lb

## 2013-10-07 DIAGNOSIS — C210 Malignant neoplasm of anus, unspecified: Secondary | ICD-10-CM

## 2013-10-07 NOTE — Progress Notes (Signed)
Procedure:  Biopsy of anal mass  Date:  08/19/2012  Pathology: Invasive squamous cell carcinoma  Hx:  She is here for long-term followup visit. She has no anal bleeding. She has not felt any anal mass or irregularity.  PE: Gina Cervantes looks well and is in no acute distress.  Anorectal-Left lateral scars noted extending posteriorly. No suspicious perianal lesions. On digital rectal exam no masses are felt.  Anoscopy-scar noted at the 3 to 5:00 position. No masses noted.  Assessment:  Invasive squamous cell carcinoma of the anus-no clinical evidence of recurrence.  Plan:  Return visit in 6 months.

## 2013-10-07 NOTE — Patient Instructions (Signed)
Call if you feel a mass or have recurrent bleeding.

## 2013-10-24 ENCOUNTER — Ambulatory Visit (HOSPITAL_COMMUNITY)
Admission: RE | Admit: 2013-10-24 | Discharge: 2013-10-24 | Disposition: A | Discharge status: Home / Self Care | Payer: BC Managed Care – PPO | Source: Ambulatory Visit | Attending: Radiation Oncology | Admitting: Radiation Oncology

## 2013-10-24 ENCOUNTER — Other Ambulatory Visit (HOSPITAL_COMMUNITY): Payer: BC Managed Care – PPO

## 2013-10-24 ENCOUNTER — Encounter (HOSPITAL_COMMUNITY): Payer: Self-pay

## 2013-10-24 DIAGNOSIS — Z9221 Personal history of antineoplastic chemotherapy: Secondary | ICD-10-CM | POA: Insufficient documentation

## 2013-10-24 DIAGNOSIS — Z853 Personal history of malignant neoplasm of breast: Secondary | ICD-10-CM | POA: Insufficient documentation

## 2013-10-24 DIAGNOSIS — J984 Other disorders of lung: Secondary | ICD-10-CM | POA: Insufficient documentation

## 2013-10-24 DIAGNOSIS — C210 Malignant neoplasm of anus, unspecified: Secondary | ICD-10-CM | POA: Insufficient documentation

## 2013-10-24 DIAGNOSIS — I2510 Atherosclerotic heart disease of native coronary artery without angina pectoris: Secondary | ICD-10-CM | POA: Insufficient documentation

## 2013-10-24 DIAGNOSIS — Z923 Personal history of irradiation: Secondary | ICD-10-CM | POA: Insufficient documentation

## 2013-10-24 DIAGNOSIS — R911 Solitary pulmonary nodule: Secondary | ICD-10-CM | POA: Insufficient documentation

## 2013-10-24 DIAGNOSIS — D490 Neoplasm of unspecified behavior of digestive system: Secondary | ICD-10-CM

## 2013-10-24 MED ORDER — IOHEXOL 300 MG/ML  SOLN
80.0000 mL | Freq: Once | INTRAMUSCULAR | Status: AC | PRN
  Administered 2013-10-24: 80 mL via INTRAVENOUS

## 2013-10-30 ENCOUNTER — Ambulatory Visit
Admission: RE | Admit: 2013-10-30 | Discharge: 2013-10-30 | Disposition: A | Discharge status: Home / Self Care | Payer: BC Managed Care – PPO | Source: Ambulatory Visit | Attending: Radiation Oncology | Admitting: Radiation Oncology

## 2013-10-30 ENCOUNTER — Encounter: Payer: Self-pay | Admitting: Radiation Oncology

## 2013-10-30 VITALS — BP 133/70 | HR 94 | Temp 98.6°F | Resp 20 | Ht 65.0 in | Wt 158.6 lb

## 2013-10-30 DIAGNOSIS — C210 Malignant neoplasm of anus, unspecified: Secondary | ICD-10-CM

## 2013-10-30 NOTE — Progress Notes (Signed)
Follow up s/p rad tx anal 09/02/12-10/10/12, no c/o saw Dr. Sherley Bounds 2 weeks ago was examined then, good news, f/u 6 months, here for CT chest results 10/24/13, appetite good, no pain, no nausea, no bowel issues, solid small bm's, , no incontinence, tightness stated 4:29 PM

## 2013-10-31 ENCOUNTER — Telehealth: Payer: Self-pay | Admitting: Radiation Oncology

## 2013-10-31 NOTE — Progress Notes (Signed)
Radiation Oncology         [phone] ________________________________  Name: Gina Cervantes MRN: [id number]  Date: 10/30/2013  DOB: [birth year]  Follow-Up Visit Note  CC: No PCP Per Patient  Rosenbower, Rhunette Croft, MD  Diagnosis:   Anal cancer  Interval Since Last Radiation:  approximally one year   Narrative:  The patient returns today for routine follow-up.  The patient states that she has been doing fairly well. She reports good appetite. No pain in the anal region. No significant difficulties in terms of bowel issues with solid bowel movements. No incontinence. The patient states that tightness in her hips has improved and this was discussed at her last visit. She does note some vaginal narrowing and we have given her to sizes of vaginal dilators today. We also discussed the possibility of her seeing physical therapy but she wanted to hold off on this currently.  The patient recently had a good report/exam with Dr. Zella Richer. She asked to defer anal exam today because of this recent examination. There were no signs or findings suggestive of recurrence. The patient had a recent CT scan of the chest and return to clinic today to discuss this.                              ALLERGIES:  has No Known Allergies.  Meds: Current Outpatient Prescriptions  Medication Sig Dispense Refill  . anastrozole (ARIMIDEX) 1 MG tablet Take 1 mg by mouth daily. TAKES AT BEDTIME      . calcium-vitamin D 250-100 MG-UNIT per tablet Take 1 tablet by mouth daily.       . Cholecalciferol (VITAMIN D) 1000 UNITS capsule Take 2,000 Units by mouth daily.       Marland Kitchen LORazepam (ATIVAN) 0.5 MG tablet Take 1 tablet (0.5 mg total) by mouth at bedtime as needed for anxiety (or sleep).  30 tablet  1  . Multiple Vitamins-Minerals (MULTIVITAMIN WITH MINERALS) tablet Take 1 tablet by mouth daily.       No current facility-administered medications for this encounter.    Physical Findings: The patient is in no acute distress.  Patient is alert and oriented.  height is 5\' 5"  (1.651 m) and weight is 158 lb 9.6 oz (71.94 kg). Her oral temperature is 98.6 F (37 C). Her blood pressure is 133/70 and her pulse is 94. Her respiration is 20. .     Lab Findings: Lab Results  Component Value Date   WBC 5.1 01/02/2013   HGB 13.5 01/02/2013   HCT 39.1 01/02/2013   MCV 105.7* 01/02/2013   PLT 240 01/02/2013     Radiographic Findings: Ct Chest W Contrast  10/24/2013   CLINICAL DATA:  Anal carcinoma diagnosed fourteen months ago. History of breast cancer. Chemotherapy and radiation therapy completed.  EXAM: CT CHEST WITH CONTRAST  TECHNIQUE: Multidetector CT imaging of the chest was performed during intravenous contrast administration.  CONTRAST:  75mL OMNIPAQUE IOHEXOL 300 MG/ML  SOLN  COMPARISON:  Chest CTs ranging from 02/24/2008 through 10/21/2012. PET-CT 08/20/2012.  FINDINGS: Small mediastinal and axillary lymph nodes are stable, not pathologically enlarged. There is stable mild atherosclerosis of the great vessels. The heart size is normal. There is no pleural or pericardial effusion.  There is stable biapical scarring and mild scarring anteriorly in the right lung, the latter attributed to prior radiation therapy. Scattered ground-glass nodular densities are again noted in both lungs. The largest component  anteriorly in the right lower lobe has not significantly changed in size, measuring 14 mm on image 36. This does appear slightly more solid centrally. The additional densities are stable.  The visualized upper abdomen has a stable appearance. There is no adrenal mass. There are no worrisome osseous findings.  IMPRESSION: 1. No evidence of metastatic disease. 2. Scattered ground-glass densities are again noted in both lungs, mostly unchanged compared with the study from last year. A sub solid right lower lobe nodule has developed increased central density, although its overall size is unchanged, and it has been present since 2009.  While this could reflect a focus of postinflammatory scarring, adenocarcinoma is considered. Continued CT follow-up is recommended in 6-12 months. These recommendations are taken from "Recommendations for the Management of Subsolid Pulmonary Nodules Detected at CT: A Statement from the [place]" Radiology 2013; 266:1, [phone].   Electronically Signed   By: Camie Patience M.D.   On: 10/24/2013 08:58    Impression:    The patient clinically is doing well. I had a chance to further review the patient's CT scan of the chest. This continued to show some scattered densities within both lungs which were largely unchanged. Repeat CT scan of the chest in 6-12 months was recommended. The patient indicated her preference to repeat this study in 9 months.  Plan:  CT scan of the chest in 9 months. The patient will then return to clinic for followup.  I spent 15 minutes with the patient today, the majority of which was spent counseling the patient on the diagnosis of cancer and coordinating care.   Jodelle Gross, M.D., Ph.D.

## 2013-10-31 NOTE — Telephone Encounter (Signed)
[facility] PAT. ABOUT LAB @ 8AM ON 07/31/2014 AND THEN A CT AT 8:30A SAME DAY. THEN F/U W/DR. JM ON 08/06/14 @ 3PM FOR RESULTS. PT AGREEABLE

## 2013-12-05 ENCOUNTER — Ambulatory Visit (INDEPENDENT_AMBULATORY_CARE_PROVIDER_SITE_OTHER): Payer: BC Managed Care – PPO | Admitting: Obstetrics & Gynecology

## 2013-12-05 ENCOUNTER — Encounter: Payer: Self-pay | Admitting: Obstetrics & Gynecology

## 2013-12-05 VITALS — BP 118/78 | HR 60 | Resp 16 | Ht 65.0 in | Wt 158.8 lb

## 2013-12-05 DIAGNOSIS — Z01419 Encounter for gynecological examination (general) (routine) without abnormal findings: Secondary | ICD-10-CM

## 2013-12-05 DIAGNOSIS — Z124 Encounter for screening for malignant neoplasm of cervix: Secondary | ICD-10-CM

## 2013-12-05 NOTE — Progress Notes (Signed)
53 y.o. [id number] [name] here for annual exam.  Oncologist is Dr. Lyndel Cervantes every six months.  CT 6/15 normal except for lung nodules which are stable.  Will have repeat next year.  One more visit with Dr. Lisbeth Cervantes.  Will continue to see Dr. Zella Cervantes now ever six months.    No vaginal bleeding.  Tried dilators but feels like increased SA has helped.  Using lubricant.    Patient's last menstrual period was 04/07/2008.          Sexually active: Yes.    The current method of family planning is vasectomy.    Exercising: Yes.    yoga Smoker:  Former smoker-quit in 1/15  Health Maintenance: Pap:  08/21/12 WNL/negative HR HPV History of abnormal Pap:  no MMG:  09/26/13-diag in one year Colonoscopy:  2014-repeat in 3 years BMD:   2014, 1.4/0.7 TDaP:  2010 Screening Labs: PCP, Hb today: PCP, Urine today: PCP   reports that she quit smoking about 6 months ago. Her smoking use included Cigarettes. She smoked 0.00 packs per day for 8 years. She has never used smokeless tobacco. She reports that she drinks alcohol. She reports that she does not use illicit drugs.  Past Medical History  Diagnosis Date  . Pain     RECTAL PAIN - PT HAS ANAL MASS--NO BLEEDING  . Anxiety     PANIC ATTACKS  . Pneumonia     NO RECENT PROBLEMS  . Status post chemotherapy 02/2008    Taxol/Herceptin  . Radiation 10/21/08-12/08/08    Right breast 6240 cGy  . HPV (human papilloma virus) anogenital infection     vulvar/ freezing hx  . Breast cancer 2009    ER+PR+HER-2+  . Anal cancer 08/09/2012    Squamous cell  . Ductal carcinoma 02/2008     invasive   . History of radiation therapy 09/02/12-10/10/12    anal 50.4Gy    Past Surgical History  Procedure Laterality Date  . Breast lumpectomy with axillary lymph node biopsy  2010    RIGHT  . Cesarean section    . Portacath placement  2009  . Removal portacath  2011  . Evaluation under anesthesia with anal fistulectomy N/A 08/09/2012    Procedure: EXAM UNDER  ANESTHESIA AND BIOPSY OF ANAL MASS;  Surgeon: Gina Hollingshead, MD;  Location: [facility];  Service: General;  Laterality: N/A;  . Breast surgery    . Breast lumpectomy  09/2008    dr. Marlou Cervantes   . [place] cath    . Portacath placement  03/2008    dr. Marlou Cervantes   . Knee arthroscopy      left knee    Current Outpatient Prescriptions  Medication Sig Dispense Refill  . anastrozole (ARIMIDEX) 1 MG tablet Take 1 mg by mouth daily. TAKES AT BEDTIME      . calcium-vitamin D 250-100 MG-UNIT per tablet Take 1 tablet by mouth daily.       . Cholecalciferol (VITAMIN D) 1000 UNITS capsule Take 2,000 Units by mouth daily.       . Multiple Vitamins-Minerals (MULTIVITAMIN WITH MINERALS) tablet Take 1 tablet by mouth daily.      Marland Kitchen LORazepam (ATIVAN) 0.5 MG tablet Take 1 tablet (0.5 mg total) by mouth at bedtime as needed for anxiety (or sleep).  30 tablet  1   No current facility-administered medications for this visit.    Family History  Problem Relation Age of Onset  . Lung cancer Father   . Stroke Mother  ROS:  Pertinent items are noted in HPI.  Otherwise, a comprehensive ROS was negative.  Exam:   BP 118/78  Pulse 60  Resp 16  Ht '5\' 5"'  (1.651 m)  Wt 158 lb 12.8 oz (72.031 kg)  BMI 26.43 kg/m2  LMP 04/07/2008  Weight change:    Height: '5\' 5"'  (165.1 cm)  Ht Readings from Last 3 Encounters:  12/05/13 '5\' 5"'  (1.651 m)  10/30/13 '5\' 5"'  (1.651 m)  10/07/13 5' 5.5" (1.664 m)    General appearance: alert, cooperative and appears stated age Head: Normocephalic, without obvious abnormality, atraumatic Neck: no adenopathy, supple, symmetrical, trachea midline and thyroid normal to inspection and palpation Lungs: clear to auscultation bilaterally Breasts: normal appearance, no masses or tenderness, and well healed right scar with minimal radiation changes Heart: regular rate and rhythm Abdomen: soft, non-tender; bowel sounds normal; no masses,  no organomegaly Extremities: extremities normal,  atraumatic, no cyanosis or edema Skin: Skin color, texture, turgor normal. No rashes or lesions Lymph nodes: Cervical, supraclavicular, and axillary nodes normal. No abnormal inguinal nodes palpated Neurologic: Grossly normal   Pelvic: External genitalia:  no lesions              Urethra:  normal appearing urethra with no masses, tenderness or lesions              Bartholins and Skenes: normal                 Vagina: normal appearing vagina with normal color and discharge, no lesions              Cervix: no lesions              Pap taken: Yes.   Bimanual Exam:  Uterus:  normal size, contour, position, consistency, mobility, non-tender              Adnexa: normal adnexa and no mass, fullness, tenderness               Rectovaginal: Confirms               Anus:  normal sphincter tone, no lesions  A:  Well Woman with normal exam  H/O Anal carcinoma.  Radiation treatment completed.    H/O invasive ductal carcinoma of right breast 2009, s/p lumpectomy/SNB/Chemo/radiaiton  Death of son due to heroine overdose 2014  Lung nodules, benign appearance, followed with yearly CT.  P: Mammogram yearly.  Doing diagnostic yearly. pap smear only.  Neg HR HPV 1 year ago. BMD every two years while on Arimidex  D/W pt PUS to check ovaries due to pt concern about ovarian cancer.  Declines today.   return annually or prn   An After Visit Summary was printed and given to the patient.

## 2013-12-09 LAB — IPS PAP TEST WITH REFLEX TO HPV

## 2013-12-26 ENCOUNTER — Other Ambulatory Visit: Payer: Self-pay | Admitting: Oncology

## 2013-12-29 ENCOUNTER — Ambulatory Visit: Payer: BC Managed Care – PPO | Admitting: Oncology

## 2013-12-29 ENCOUNTER — Other Ambulatory Visit: Payer: BC Managed Care – PPO

## 2014-03-09 ENCOUNTER — Encounter: Payer: Self-pay | Admitting: Obstetrics & Gynecology

## 2014-06-12 ENCOUNTER — Telehealth: Payer: Self-pay | Admitting: *Deleted

## 2014-06-12 NOTE — Telephone Encounter (Signed)
Patient had called to cancel her ct scan  Stated "she couldn't afford it and had had 4 done in last 5 years, but wioll keep her f/u appt with Dr.moody in March, transferred call back to Earleen Newport to cancel patient's request 9:58 AM

## 2014-07-30 ENCOUNTER — Telehealth: Payer: Self-pay | Admitting: *Deleted

## 2014-07-30 ENCOUNTER — Ambulatory Visit: Admission: RE | Admit: 2014-07-30 | Payer: Self-pay | Source: Ambulatory Visit | Admitting: Radiation Oncology

## 2014-07-30 NOTE — Telephone Encounter (Signed)
Called patient , asked if she was on her way for  Her follow up appt today?,NO, ,she thought her follow up appt was tomorrow asked if she wanted to come tomorrow  At same time as today, NO<I don't need to be seen ,my hiney is gret, I see Dr. Benay Spice and other MD's, I really don't feel I need to be sen by Radiation " said I would relay that to MD and if he really feels she should be seen by him ,our scheduler, Earleen Newport would be giving her a call next week 4:29 PM

## 2014-07-31 ENCOUNTER — Ambulatory Visit: Payer: BC Managed Care – PPO

## 2014-07-31 ENCOUNTER — Ambulatory Visit (HOSPITAL_COMMUNITY): Payer: BC Managed Care – PPO

## 2014-08-04 ENCOUNTER — Other Ambulatory Visit: Payer: Self-pay | Admitting: Oncology

## 2014-08-06 ENCOUNTER — Ambulatory Visit: Payer: BC Managed Care – PPO | Admitting: Radiation Oncology

## 2014-09-16 ENCOUNTER — Other Ambulatory Visit: Payer: Self-pay

## 2014-09-16 DIAGNOSIS — Z1231 Encounter for screening mammogram for malignant neoplasm of breast: Secondary | ICD-10-CM

## 2014-10-06 ENCOUNTER — Other Ambulatory Visit: Payer: Self-pay

## 2014-10-06 ENCOUNTER — Other Ambulatory Visit: Payer: Self-pay | Admitting: Oncology

## 2014-10-06 DIAGNOSIS — Z853 Personal history of malignant neoplasm of breast: Secondary | ICD-10-CM

## 2014-10-07 DIAGNOSIS — Z8619 Personal history of other infectious and parasitic diseases: Secondary | ICD-10-CM

## 2014-10-07 HISTORY — DX: Personal history of other infectious and parasitic diseases: Z86.19

## 2014-10-08 ENCOUNTER — Ambulatory Visit: Payer: Self-pay

## 2014-10-09 ENCOUNTER — Ambulatory Visit
Admission: RE | Admit: 2014-10-09 | Discharge: 2014-10-09 | Disposition: A | Discharge status: Home / Self Care | Payer: BLUE CROSS/BLUE SHIELD | Source: Ambulatory Visit | Attending: Oncology | Admitting: Oncology

## 2014-10-09 DIAGNOSIS — Z853 Personal history of malignant neoplasm of breast: Secondary | ICD-10-CM

## 2014-10-16 ENCOUNTER — Other Ambulatory Visit: Payer: Self-pay | Admitting: *Deleted

## 2014-10-16 ENCOUNTER — Telehealth: Payer: Self-pay | Admitting: Nurse Practitioner

## 2014-10-16 NOTE — Telephone Encounter (Signed)
Lft msg for pt confirming MD visit per 06/10 POF mailed schedule to pt... KJ

## 2014-11-13 ENCOUNTER — Telehealth: Payer: Self-pay | Admitting: *Deleted

## 2014-11-13 NOTE — Telephone Encounter (Signed)
Oncology Nurse Navigator Documentation  Oncology Nurse Navigator Flowsheets 11/13/2014  Navigator Encounter Type Telephone  Left VM to confirm her appointment on 11/16/14 at 3:15 pm . Please call if not able to make the appointment.

## 2014-11-16 ENCOUNTER — Telehealth: Payer: Self-pay | Admitting: Nurse Practitioner

## 2014-11-16 ENCOUNTER — Ambulatory Visit (HOSPITAL_BASED_OUTPATIENT_CLINIC_OR_DEPARTMENT_OTHER): Payer: BLUE CROSS/BLUE SHIELD | Admitting: Nurse Practitioner

## 2014-11-16 VITALS — BP 115/56 | HR 89 | Temp 98.7°F | Resp 18 | Ht 65.0 in | Wt 154.5 lb

## 2014-11-16 DIAGNOSIS — Z170 Estrogen receptor positive status [ER+]: Secondary | ICD-10-CM | POA: Diagnosis not present

## 2014-11-16 DIAGNOSIS — C211 Malignant neoplasm of anal canal: Secondary | ICD-10-CM

## 2014-11-16 DIAGNOSIS — Z79811 Long term (current) use of aromatase inhibitors: Secondary | ICD-10-CM | POA: Diagnosis not present

## 2014-11-16 DIAGNOSIS — C50911 Malignant neoplasm of unspecified site of right female breast: Secondary | ICD-10-CM | POA: Diagnosis not present

## 2014-11-16 DIAGNOSIS — C210 Malignant neoplasm of anus, unspecified: Secondary | ICD-10-CM

## 2014-11-16 NOTE — Telephone Encounter (Signed)
Pt confirmed labs/ov per 07/11 POF, gave pt AVS and Calendar.... KJ °

## 2014-11-16 NOTE — Progress Notes (Addendum)
[facility] OFFICE PROGRESS NOTE   Diagnosis:  Anal cancer and breast cancer.  INTERVAL HISTORY:   Gina Cervantes was last seen in our office February 2015. No change in bowel habits. No bloody or black stools. No pain with bowel movements. No nausea or vomiting. She has a good appetite. No shortness of breath, cough. No chest pain. No changes over either breast. She continues Arimidex. No significant hot flashes. Mild intermittent generalized joint pains. No vaginal bleeding. She was recently diagnosed with shingles at the left face around the eye.  Objective:  Vital signs in last 24 hours:  Blood pressure 115/56, pulse 89, temperature 98.7 F (37.1 C), temperature source Oral, resp. rate 18, height '5\' 5"'  (1.651 m), weight 154 lb 8 oz (70.081 kg), last menstrual period 04/07/2008, SpO2 92 %.    HEENT: No thrush or ulcers. Resolving rash left lateral face/periorbital region. Lymphatics: No palpable cervical, supraclavicular, axillary or inguinal lymph nodes. Resp: Lungs clear bilaterally. Cardio: Regular rate and rhythm. GI: Abdomen soft and nontender. No hepatomegaly. Perianal skin with radiation changes. No rectal mass. Vascular: No leg edema. Calves soft and nontender. Breast: Status post right lumpectomy. No evidence for local tumor recurrence. No mass palpated in either breast.   Lab Results:  Lab Results  Component Value Date   WBC 5.1 01/02/2013   HGB 13.5 01/02/2013   HCT 39.1 01/02/2013   MCV 105.7* 01/02/2013   PLT 240 01/02/2013   NEUTROABS 2.6 01/02/2013    Imaging:  Mammogram 10/09/2014 with benign findings.  Medications: I have reviewed the patient's current medications.  Assessment/Plan: 1.Anal cancer-left anal margin/anal canal mass, status post a biopsy on 08/09/2012 confirming invasive moderately differentiated squamous cell carcinoma,p16 positive.  -Staging PET scan 08/20/2012-hypermetabolic anal mass, no hypermetabolic metastases   -Initiation of concurrent radiation with cycle 1 of mitomycin C./5-fluorouracil 09/02/2012.  -She completed cycle 2 5-fluorouracil/mitomycin C. beginning 10/01/2012.  -She completed radiation 10/10/2012.  2. Right breast cancer 2009, ER positive, PR positive, HER-2 positive. She is status post neoadjuvant AC x4 cycles followed by Taxol/Herceptin. She underwent a right lumpectomy and sentinel lymph node biopsy 09/22/2008 with no evidence of residual invasive carcinoma and 5 negative sentinel lymph nodes. She then completed right breast radiation. She began tamoxifen 12/15/2008 and completed adjuvant Herceptin 05/25/2009. The tamoxifen was discontinued and she was switched to Arimidex in July 2013.  3. History of enlargement of the right tonsil.  4. Nonspecific pulmonary nodules without hypermetabolic activity on the PET scan 08/20/2012. Chest CT 10/21/2012 with scattered pulmonary nodules including nodules that were not present in 2010. Annual surveillance was recommended. Follow-up chest CT 10/24/2013 with scattered groundglass densities again noted in both lungs mostly unchanged when compared to the previous study. A sub-solid right lower lobe nodule had increased central density. The size was unchanged.The nodule has been present since 2009.    Disposition: Gina Cervantes remains in clinical remission from anal cancer and breast cancer.   She will contact Dr. Zella Richer to schedule a follow-up appointment for an anal examination.   She will continue Arimidex with plans for a 5 year course.  We recommended proceeding with a chest CT to follow-up the right lower lobe nodule. She declines a chest CT at this time. She will contact the office if she changes her mind.   She will return for a follow-up visit in 9 months. She will contact the office in the interim as outlined above or with any problems.  Patient seen with Dr.  Sherrill. 25 minutes were spent face-to-face at today's visit with the  majority of that time involved in counseling/coordination of care.  Ned Card ANP/GNP-BC   11/16/2014  3:36 PM  This was a shared visit with Ned Card. Gina Cervantes remains in clinical remission from breast cancer and anal cancer. She will schedule along with Dr. Zella Richer for a surveillance anal exam. She declines a follow-up chest CT.  Julieanne Manson, M.D.

## 2014-12-22 ENCOUNTER — Ambulatory Visit (INDEPENDENT_AMBULATORY_CARE_PROVIDER_SITE_OTHER): Payer: BLUE CROSS/BLUE SHIELD | Admitting: Obstetrics & Gynecology

## 2014-12-22 ENCOUNTER — Encounter: Payer: Self-pay | Admitting: Obstetrics & Gynecology

## 2014-12-22 VITALS — BP 110/70 | HR 92 | Resp 16 | Ht 64.5 in | Wt 154.0 lb

## 2014-12-22 DIAGNOSIS — Z124 Encounter for screening for malignant neoplasm of cervix: Secondary | ICD-10-CM | POA: Diagnosis not present

## 2014-12-22 DIAGNOSIS — E2839 Other primary ovarian failure: Secondary | ICD-10-CM

## 2014-12-22 DIAGNOSIS — Z01419 Encounter for gynecological examination (general) (routine) without abnormal findings: Secondary | ICD-10-CM | POA: Diagnosis not present

## 2014-12-22 NOTE — Progress Notes (Signed)
Patient ID: Gina Cervantes, female   DOB: October 16, 1960, 54 y.o.   MRN: [id number]   54 y.o. [id number] [name] here for annual exam.    Patient's last menstrual period was 04/07/2008.          Sexually active: Yes.    The current method of family planning is post menopausal status.    Exercising: Yes.    bike/yoga Smoker:  Yes 2- 3 per week   Health Maintenance: Pap:  12-05-13 WNL  History of abnormal Pap:  no MMG:  10-09-14 WNL Bi Rads 2  Colonoscopy:  10/2012 repeat in 3 years BMD:   05-10-12 WNL TDaP:  08-21-08     reports that she has been smoking Cigarettes.  She has smoked for the past 8 years. She has never used smokeless tobacco. She reports that she drinks alcohol. She reports that she does not use illicit drugs.  Past Medical History  Diagnosis Date  . Anxiety     PANIC ATTACKS  . Pneumonia     NO RECENT PROBLEMS  . Status post chemotherapy 02/2008    Taxol/Herceptin  . Radiation 10/21/08-12/08/08    Right breast 6240 cGy  . HPV (human papilloma virus) anogenital infection     vulvar/ freezing hx  . Breast cancer 2009    ER+PR+HER-2+  . Anal cancer 08/09/2012    Squamous cell  . Ductal carcinoma 02/2008     invasive   . History of radiation therapy 09/02/12-10/10/12    anal 50.4Gy  . History of shingles 10/2014     Past Surgical History  Procedure Laterality Date  . Breast lumpectomy with axillary lymph node biopsy Right 5/10    Dr. Marlou Starks  . Cesarean section    . Removal portacath  2011  . Evaluation under anesthesia with anal fistulectomy N/A 08/09/2012    Procedure: EXAM UNDER ANESTHESIA AND BIOPSY OF ANAL MASS;  Surgeon: Odis Hollingshead, MD;  Location: [facility];  Service: General;  Laterality: N/A;  . Portacath placement  03/2008    dr. Marlou Starks   . Knee arthroscopy      left knee    Current Outpatient Prescriptions  Medication Sig Dispense Refill  . anastrozole (ARIMIDEX) 1 MG tablet TAKE 1 TABLET BY MOUTH DAILY. 90 tablet 1  . calcium-vitamin D 250-100  MG-UNIT per tablet Take 1 tablet by mouth daily.     . Cholecalciferol (VITAMIN D) 1000 UNITS capsule Take 2,000 Units by mouth daily.     . Multiple Vitamins-Minerals (MULTIVITAMIN WITH MINERALS) tablet Take 1 tablet by mouth daily.     No current facility-administered medications for this visit.    Family History  Problem Relation Age of Onset  . Lung cancer Father   . Stroke Mother     ROS:  Pertinent items are noted in HPI.  Otherwise, a comprehensive ROS was negative.  Exam:   BP 110/70 mmHg  Pulse 92  Resp 16  Ht 5' 4.5" (1.638 m)  Wt 154 lb (69.854 kg)  BMI 26.04 kg/m2  LMP 04/07/2008    Height: 5' 4.5" (163.8 cm)  Ht Readings from Last 3 Encounters:  12/22/14 5' 4.5" (1.638 m)  11/16/14 '5\' 5"'  (1.651 m)  12/05/13 '5\' 5"'  (1.651 m)    General appearance: alert, cooperative and appears stated age Head: Normocephalic, without obvious abnormality, atraumatic Neck: no adenopathy, supple, symmetrical, trachea midline and thyroid normal to inspection and palpation Lungs: clear to auscultation bilaterally Breasts: right breast scar axillary scar, right  breast radiation changes, left breast with masses, skin changes, LAD, or nipple discharge Heart: regular rate and rhythm Abdomen: soft, non-tender; bowel sounds normal; no masses,  no organomegaly Extremities: extremities normal, atraumatic, no cyanosis or edema Skin: Skin color, texture, turgor normal. No rashes or lesions Lymph nodes: Cervical, supraclavicular, and axillary nodes normal. No abnormal inguinal nodes palpated Neurologic: Grossly normal   Pelvic: External genitalia:  no lesions              Urethra:  normal appearing urethra with no masses, tenderness or lesions              Bartholins and Skenes: normal                 Vagina: normal appearing vagina with normal color and discharge, no lesions              Cervix: no lesions              Pap taken: Yes.   Bimanual Exam:  Uterus:  normal size, contour,  position, consistency, mobility, non-tender              Adnexa: normal adnexa and no mass, fullness, tenderness               Rectovaginal: Confirms               Anus:  normal sphincter tone, no lesions or nodules, radiation changes  Chaperone was present for exam.  A:  Well Woman with normal exam  H/O Anal carcinoma. Radiation treatment completed.  H/O invasive ductal carcinoma of right breast 2009, s/p lumpectomy/SNB/Chemo/radiaiton.  Took Tamoxifen for 5 years.  Now on Arimidex for 5 additional years Death of son due to heroine overdose 2014  Lung nodules, benign appearance, followed with yearly to every other year CT.  Last one was 6/15.  Followed by Dr. Benay Spice.    P: Mammogram yearly. Doing diagnostic yearly. pap smear only. Neg pap 2015.  Neg pap with neg HR HVP 2014.  Plan pap and HR HPV next year.   BMD every two years while on Arimidex.  Appt made for pt. Return annually or prn

## 2014-12-24 ENCOUNTER — Other Ambulatory Visit: Payer: Self-pay | Admitting: *Deleted

## 2014-12-24 ENCOUNTER — Other Ambulatory Visit: Payer: BLUE CROSS/BLUE SHIELD

## 2014-12-24 LAB — IPS PAP TEST WITH REFLEX TO HPV

## 2014-12-25 ENCOUNTER — Other Ambulatory Visit: Payer: Self-pay | Admitting: Obstetrics & Gynecology

## 2014-12-25 ENCOUNTER — Other Ambulatory Visit (INDEPENDENT_AMBULATORY_CARE_PROVIDER_SITE_OTHER): Payer: BLUE CROSS/BLUE SHIELD

## 2014-12-25 DIAGNOSIS — Z0000 Encounter for general adult medical examination without abnormal findings: Secondary | ICD-10-CM

## 2014-12-26 LAB — LIPID PANEL
Cholesterol: 224 mg/dL — ABNORMAL HIGH (ref 125–200)
HDL: 72 mg/dL (ref >=46)
LDL Cholesterol: 142 mg/dL — ABNORMAL HIGH (ref <130)
Total CHOL/HDL Ratio: 3.1 Ratio (ref <=5.0)
Triglycerides: 52 mg/dL (ref <150)
VLDL: 10 mg/dL (ref <30)

## 2014-12-26 LAB — COMPREHENSIVE METABOLIC PANEL
ALT: 12 U/L (ref 6–29)
AST: 14 U/L (ref 10–35)
Albumin: 4 g/dL (ref 3.6–5.1)
Alkaline Phosphatase: 68 U/L (ref 33–130)
BUN: 12 mg/dL (ref 7–25)
CO2: 29 mmol/L (ref 20–31)
Calcium: 9.2 mg/dL (ref 8.6–10.4)
Chloride: 100 mmol/L (ref 98–110)
Creat: 0.56 mg/dL (ref 0.50–1.05)
Glucose, Bld: 89 mg/dL (ref 65–99)
Potassium: 4.8 mmol/L (ref 3.5–5.3)
Sodium: 141 mmol/L (ref 135–146)
Total Bilirubin: 0.4 mg/dL (ref 0.2–1.2)
Total Protein: 6.9 g/dL (ref 6.1–8.1)

## 2014-12-26 LAB — TSH: TSH: 3.173 u[IU]/mL (ref 0.350–4.500)

## 2014-12-26 LAB — VITAMIN D 25 HYDROXY (VIT D DEFICIENCY, FRACTURES): Vit D, 25-Hydroxy: 36 ng/mL (ref 30–100)

## 2014-12-28 ENCOUNTER — Telehealth: Payer: Self-pay

## 2014-12-28 NOTE — Telephone Encounter (Signed)
-----   Message from Megan Salon, MD sent at 12/27/2014  4:02 PM EDT ----- Inform pt Vit D 36.  Continue calcium with Vit D supplement she is taking.  Inform TSH normal.  Inform CMP normal.  Inform lipids mildly elevated with total 224 and LDL 142.  However HDL is good at 72 and triglycerides are normal.  Repeat yearly and fasting.

## 2014-12-28 NOTE — Telephone Encounter (Signed)
Lmtcb//kn 

## 2014-12-28 NOTE — Telephone Encounter (Signed)
Patient notified of all results.//kn 

## 2014-12-30 ENCOUNTER — Ambulatory Visit
Admission: RE | Admit: 2014-12-30 | Discharge: 2014-12-30 | Disposition: A | Discharge status: Home / Self Care | Payer: BLUE CROSS/BLUE SHIELD | Source: Ambulatory Visit | Attending: Obstetrics & Gynecology | Admitting: Obstetrics & Gynecology

## 2014-12-30 DIAGNOSIS — E2839 Other primary ovarian failure: Secondary | ICD-10-CM

## 2015-01-04 ENCOUNTER — Telehealth: Payer: Self-pay

## 2015-01-04 NOTE — Telephone Encounter (Signed)
-----   Message from Megan Salon, MD sent at 12/31/2014  1:37 PM EDT ----- Inform pt BMD is totally normal.  Will repeat in about 5 years.

## 2015-01-04 NOTE — Telephone Encounter (Signed)
Lmtcb//kn 

## 2015-01-18 NOTE — Telephone Encounter (Signed)
Patient notified of all results.//kn 

## 2015-04-08 ENCOUNTER — Other Ambulatory Visit: Payer: Self-pay | Admitting: Oncology

## 2015-05-09 DIAGNOSIS — B029 Zoster without complications: Secondary | ICD-10-CM

## 2015-05-09 HISTORY — DX: Zoster without complications: B02.9

## 2015-08-10 ENCOUNTER — Telehealth: Payer: Self-pay | Admitting: Obstetrics & Gynecology

## 2015-08-10 DIAGNOSIS — R140 Abdominal distension (gaseous): Secondary | ICD-10-CM

## 2015-08-10 DIAGNOSIS — R102 Pelvic and perineal pain: Secondary | ICD-10-CM

## 2015-08-10 NOTE — Telephone Encounter (Signed)
Called patient to review benefits for a recommended procedure. Left Voicemail requesting a call back. °

## 2015-08-10 NOTE — Telephone Encounter (Signed)
Spoke with pt regarding benefit for ultrasound. Patient understood and agreeable. Patient ready to schedule. Patient scheduled 08/12/15 with Dr Sabra Heck. Pt aware of arrival date and time. Pt aware of 72 hours cancellation policy with $182 fee. No further questions. Ok to close

## 2015-08-10 NOTE — Telephone Encounter (Signed)
Spoke with patient. Last seen in office on 12/22/2014 for aex with Dr.Miller. Patient states that she has a history of breast and anal cancer. Reports she has discussed having an ultrasound with Dr.Miller due to concerns for ovarian cancer. "She told me to let her know if I ever have any bloating or cramping and that we could check it out since it is such a scary thing." Patient states she has been experiencing intermittent cramping "like I am going to start my period" and slight bloating over the last week. Denies any sharp pain, change in appetite, or change in bowel habits. She is requesting to proceed with PUS at this time. PUS scheduled for 08/12/2015 at 2:30 pm with 3 pm consult with Dr.Miller. She is agreeable to date and time. Order placed for precert.  Dr.Miller, okay to keep patient as scheduled for PUS?

## 2015-08-10 NOTE — Telephone Encounter (Signed)
Yes.  Agree with plan.  Ok to close encounter.

## 2015-08-10 NOTE — Telephone Encounter (Signed)
Patient says she is having cramps and discomfort. Would like to schedule an ultrasound.

## 2015-08-12 ENCOUNTER — Ambulatory Visit (INDEPENDENT_AMBULATORY_CARE_PROVIDER_SITE_OTHER): Payer: BLUE CROSS/BLUE SHIELD

## 2015-08-12 ENCOUNTER — Ambulatory Visit (INDEPENDENT_AMBULATORY_CARE_PROVIDER_SITE_OTHER): Payer: BLUE CROSS/BLUE SHIELD | Admitting: Obstetrics & Gynecology

## 2015-08-12 VITALS — BP 106/60 | HR 72 | Resp 16 | Ht 64.5 in | Wt 157.0 lb

## 2015-08-12 DIAGNOSIS — R140 Abdominal distension (gaseous): Secondary | ICD-10-CM

## 2015-08-12 DIAGNOSIS — N949 Unspecified condition associated with female genital organs and menstrual cycle: Secondary | ICD-10-CM

## 2015-08-12 DIAGNOSIS — R102 Pelvic and perineal pain: Secondary | ICD-10-CM

## 2015-08-12 NOTE — Progress Notes (Signed)
55 y.o. [id number] Married Caucasian female here for pelvic ultrasound due to increased abdominal bloating as well as cramping "like she is going to start a cycle".  Pt has hx of breast and anal cancer and I've advised in the past if she has any abdominal symptoms changes that persist, to call to be seen for ultrasound of her ovaries.  She is due for a colonoscopy this year.  Denies vaginal bleeding  Patient's last menstrual period was 04/07/2008.  Contraception: PMP status  Findings:  UTERUS: 6.6 x 4.3 x 2.7cm with several small fibroids, 540m, 319m and 40m63m Largest fibroid is calcified.  EMS: 1.8mm24mNEXA: Left ovary:  1.9 x 1.4 x 0.9cm       Right ovary: 2.1 x 1.4 x 1.0cm CUL DE SAC: no free fluid  Discussion:  Discussed with the patient ultrasound findings including the 3 small fibroids. Discussed with patient calcifications and fibroids are quite common especially in a postmenopausal female. Reviewed ovarian findings and that her ovaries appear completely normal. Patient is quite relieved.  Assessment:  Increased abdominal bloating and pelvic cramping  Plan:  Patient scheduled a follow-up with her oncologist on the 11th. A cyanosis exam is normal then she will proceed with colonoscopy which is due this year Dr. MannCollene Mares15 minutes spent with patient >50% of time was in face to face discussion of above.

## 2015-08-17 ENCOUNTER — Encounter: Payer: Self-pay | Admitting: Oncology

## 2015-08-17 ENCOUNTER — Ambulatory Visit (HOSPITAL_BASED_OUTPATIENT_CLINIC_OR_DEPARTMENT_OTHER): Payer: BLUE CROSS/BLUE SHIELD | Admitting: Oncology

## 2015-08-17 ENCOUNTER — Telehealth: Payer: Self-pay | Admitting: Oncology

## 2015-08-17 VITALS — BP 112/77 | HR 86 | Temp 98.7°F | Resp 18 | Ht 64.5 in | Wt 158.0 lb

## 2015-08-17 DIAGNOSIS — Z853 Personal history of malignant neoplasm of breast: Secondary | ICD-10-CM

## 2015-08-17 DIAGNOSIS — Z85048 Personal history of other malignant neoplasm of rectum, rectosigmoid junction, and anus: Secondary | ICD-10-CM

## 2015-08-17 DIAGNOSIS — C50411 Malignant neoplasm of upper-outer quadrant of right female breast: Secondary | ICD-10-CM

## 2015-08-17 NOTE — Progress Notes (Signed)
  [place] OFFICE PROGRESS NOTE   Diagnosis: Anal cancer, breast cancer  INTERVAL HISTORY:   Ms. Napierala returns as scheduled. She feels well. No change in either breast. No difficulty with bowel movements. She reports undergoing an anal evaluation by Dr. Zella Richer within the past few months. She plans to schedule a colonoscopy with Dr. Collene Mares this year. A bilateral mammogram in June 2016 was negative. A bone density scan 12/30/2014 was normal.  Objective:  Vital signs in last 24 hours:  Blood pressure 112/77, pulse 86, temperature 98.7 F (37.1 C), temperature source Oral, resp. rate 18, height 5' 4.5" (1.638 m), weight 158 lb (71.668 kg), last menstrual period 04/07/2008, SpO2 98 %.    HEENT: Neck without mass Lymphatics: No cervical, supraclavicular, axillary, or inguinal nodes Resp: Lungs clear bilaterally Cardio: Regular rate and rhythm GI: No hepatomegaly, no mass, nontender Vascular: No leg edema  She declined a rectal and breast examination    Medications: I have reviewed the patient's current medications.  Assessment/Plan: 1.Anal cancer-left anal margin/anal canal mass, status post a biopsy on 08/09/2012 confirming invasive moderately differentiated squamous cell carcinoma,p16 positive.  -Staging PET scan 08/20/2012-hypermetabolic anal mass, no hypermetabolic metastases  -Initiation of concurrent radiation with cycle 1 of mitomycin C./5-fluorouracil 09/02/2012.  -She completed cycle 2 5-fluorouracil/mitomycin C. beginning 10/01/2012.  -She completed radiation 10/10/2012.  2. Right breast cancer 2009, ER positive, PR positive, HER-2 positive. She is status post neoadjuvant AC x4 cycles followed by Taxol/Herceptin. She underwent a right lumpectomy and sentinel lymph node biopsy 09/22/2008 with no evidence of residual invasive carcinoma and 5 negative sentinel lymph nodes. She then completed right breast radiation. She began tamoxifen 12/15/2008 and  completed adjuvant Herceptin 05/25/2009. The tamoxifen was discontinued and she was switched to Arimidex in July 2013.  3. History of enlargement of the right tonsil.  4. Nonspecific pulmonary nodules without hypermetabolic activity on the PET scan 08/20/2012. Chest CT 10/21/2012 with scattered pulmonary nodules including nodules that were not present in 2010. Annual surveillance was recommended. Follow-up chest CT 10/24/2013 with scattered groundglass densities again noted in both lungs mostly unchanged when compared to the previous study. A sub-solid right lower lobe nodule had increased central density. The size was unchanged.The nodule has been present since 2009.    Disposition:  Ms. Aguillard remains in clinical remission from breast cancer and anal cancer. She will complete a total of 10 years of hormonal therapy for breast cancer. She plans to follow-up with Dr. Collene Mares for a colonoscopy this year. She continues anal surveillance with Dr. Zella Richer. She will return for an office visit here in 9 months.  Betsy Coder, MD  08/17/2015  3:47 PM

## 2015-08-17 NOTE — Telephone Encounter (Signed)
per pof to sch pt appt-sent Dr Benay Spice email to chge mammo order to Diagnostic-will sch pt after reply

## 2015-08-18 ENCOUNTER — Encounter: Payer: Self-pay | Admitting: *Deleted

## 2015-08-18 NOTE — Progress Notes (Signed)
Called pt to inquire if she would be agreeable to CT chest per order Dr. Benay Spice.  Pt states that she still does not want CT done d/t cost.

## 2015-08-20 ENCOUNTER — Encounter: Payer: Self-pay | Admitting: Obstetrics & Gynecology

## 2015-09-07 ENCOUNTER — Other Ambulatory Visit: Payer: Self-pay | Admitting: Oncology

## 2015-09-07 DIAGNOSIS — Z9889 Other specified postprocedural states: Secondary | ICD-10-CM

## 2015-09-07 DIAGNOSIS — Z853 Personal history of malignant neoplasm of breast: Secondary | ICD-10-CM

## 2015-09-14 DIAGNOSIS — Z85048 Personal history of other malignant neoplasm of rectum, rectosigmoid junction, and anus: Secondary | ICD-10-CM | POA: Diagnosis not present

## 2015-10-20 ENCOUNTER — Ambulatory Visit
Admission: RE | Admit: 2015-10-20 | Discharge: 2015-10-20 | Disposition: A | Discharge status: Home / Self Care | Payer: BLUE CROSS/BLUE SHIELD | Source: Ambulatory Visit | Attending: Oncology | Admitting: Oncology

## 2015-10-20 DIAGNOSIS — R928 Other abnormal and inconclusive findings on diagnostic imaging of breast: Secondary | ICD-10-CM | POA: Diagnosis not present

## 2015-10-20 DIAGNOSIS — Z9889 Other specified postprocedural states: Secondary | ICD-10-CM

## 2015-10-20 DIAGNOSIS — Z853 Personal history of malignant neoplasm of breast: Secondary | ICD-10-CM

## 2015-10-29 ENCOUNTER — Encounter: Payer: Self-pay | Admitting: Genetic Counselor

## 2015-11-19 ENCOUNTER — Other Ambulatory Visit: Payer: Self-pay | Admitting: Oncology

## 2015-11-25 ENCOUNTER — Other Ambulatory Visit: Payer: Self-pay | Admitting: Oncology

## 2015-11-25 DIAGNOSIS — Z85048 Personal history of other malignant neoplasm of rectum, rectosigmoid junction, and anus: Secondary | ICD-10-CM | POA: Diagnosis not present

## 2015-11-25 DIAGNOSIS — Z1211 Encounter for screening for malignant neoplasm of colon: Secondary | ICD-10-CM | POA: Diagnosis not present

## 2015-11-30 ENCOUNTER — Other Ambulatory Visit: Payer: Self-pay | Admitting: *Deleted

## 2015-12-29 DIAGNOSIS — Z1211 Encounter for screening for malignant neoplasm of colon: Secondary | ICD-10-CM | POA: Diagnosis not present

## 2015-12-29 DIAGNOSIS — K635 Polyp of colon: Secondary | ICD-10-CM | POA: Diagnosis not present

## 2015-12-29 DIAGNOSIS — D123 Benign neoplasm of transverse colon: Secondary | ICD-10-CM | POA: Diagnosis not present

## 2015-12-29 DIAGNOSIS — K621 Rectal polyp: Secondary | ICD-10-CM | POA: Diagnosis not present

## 2015-12-29 DIAGNOSIS — D128 Benign neoplasm of rectum: Secondary | ICD-10-CM | POA: Diagnosis not present

## 2015-12-29 LAB — HM COLONOSCOPY

## 2016-02-28 DIAGNOSIS — Z23 Encounter for immunization: Secondary | ICD-10-CM | POA: Diagnosis not present

## 2016-03-13 ENCOUNTER — Ambulatory Visit (INDEPENDENT_AMBULATORY_CARE_PROVIDER_SITE_OTHER): Payer: BLUE CROSS/BLUE SHIELD | Admitting: Licensed Clinical Social Worker

## 2016-03-13 DIAGNOSIS — F321 Major depressive disorder, single episode, moderate: Secondary | ICD-10-CM

## 2016-03-28 ENCOUNTER — Ambulatory Visit: Payer: BLUE CROSS/BLUE SHIELD | Admitting: Licensed Clinical Social Worker

## 2016-04-07 ENCOUNTER — Encounter: Payer: Self-pay | Admitting: Obstetrics & Gynecology

## 2016-04-07 ENCOUNTER — Ambulatory Visit (INDEPENDENT_AMBULATORY_CARE_PROVIDER_SITE_OTHER): Payer: BLUE CROSS/BLUE SHIELD | Admitting: Obstetrics & Gynecology

## 2016-04-07 VITALS — BP 106/60 | HR 90 | Resp 14 | Ht 65.0 in | Wt 160.0 lb

## 2016-04-07 DIAGNOSIS — Z205 Contact with and (suspected) exposure to viral hepatitis: Secondary | ICD-10-CM | POA: Diagnosis not present

## 2016-04-07 DIAGNOSIS — Z01419 Encounter for gynecological examination (general) (routine) without abnormal findings: Secondary | ICD-10-CM | POA: Diagnosis not present

## 2016-04-07 DIAGNOSIS — Z124 Encounter for screening for malignant neoplasm of cervix: Secondary | ICD-10-CM

## 2016-04-07 DIAGNOSIS — Z0000 Encounter for general adult medical examination without abnormal findings: Secondary | ICD-10-CM

## 2016-04-07 LAB — CBC
HCT: 44 % (ref 35.0–45.0)
Hemoglobin: 14.7 g/dL (ref 11.7–15.5)
MCH: 33.9 pg — ABNORMAL HIGH (ref 27.0–33.0)
MCHC: 33.4 g/dL (ref 32.0–36.0)
MCV: 101.6 fL — ABNORMAL HIGH (ref 80.0–100.0)
MPV: 9.2 fL (ref 7.5–12.5)
Platelets: 254 10*3/uL (ref 140–400)
RBC: 4.33 MIL/uL (ref 3.80–5.10)
RDW: 14.2 % (ref 11.0–15.0)
WBC: 5.8 10*3/uL (ref 3.8–10.8)

## 2016-04-07 MED ORDER — LORAZEPAM 1 MG PO TABS: 1.0000 mg | ORAL_TABLET | Freq: Three times a day (TID) | ORAL | 1 refills | Status: DC

## 2016-04-07 NOTE — Progress Notes (Signed)
55 y.o. [id number] [name] here for annual exam.  No vaginal bleeding.  No rectal bleeding.  Still seeing Dr. Zella Richer.  Hates going into the cancer center.  Would like to know if I can write her arimidex for the last year.  Will send message to Dr. Benay Spice.  Reports lots of stressors in her life.  She is smoking again.  She KNOWS this is just a terrible thing to do.  Mother in law is living with them.  She is moving out in January.  Having a lot of issues with sleep.  Used Ativan when she had her breast cancer diagnosis.  Would like rx if possible.  Seeing therapist, Marya Amsler.    Patient's last menstrual period was 04/07/2008.          Sexually active: Yes.    The current method of family planning is vasectomy.    Exercising: Yes.    walking, biking Smoker:  yes  Health Maintenance: Pap:  12/22/14 negative, 08/21/12 negative HR HPV  History of abnormal Pap:  yes MMG:  10/20/15 BIRADS 2 benign  Colonoscopy:  12/29/15- Dr. Collene Mares- polyp- repeat 3 years BMD:   12/30/14 normal  TDaP:  08/21/08  Pneumonia vaccine(s):  never Zostavax:   Never, had shingles Summer 2016  Hep C testing: drawn today Screening Labs: drawn today, Hb today: drawn today, Urine today: declined   reports that she has been smoking Cigarettes.  She has smoked for the past 8.00 years. She has never used smokeless tobacco. She reports that she drinks alcohol. She reports that she does not use drugs.  Past Medical History:  Diagnosis Date  . Anal cancer ([place]) 08/09/2012   Squamous cell  . Anxiety    PANIC ATTACKS  . Breast cancer ([place]) 2009   ER+PR+HER-2+  . Ductal carcinoma ([place]) 02/2008    invasive   . History of radiation therapy 09/02/12-10/10/12   anal 50.4Gy  . History of shingles 10/2014   . HPV (human papilloma virus) anogenital infection    vulvar/ freezing hx  . Pneumonia    NO RECENT PROBLEMS  . Radiation 10/21/08-12/08/08   Right breast 6240 cGy  . Status post chemotherapy 02/2008   Taxol/Herceptin    Past Surgical History:  Procedure Laterality Date  . BREAST LUMPECTOMY WITH AXILLARY LYMPH NODE BIOPSY Right 5/10   Dr. Marlou Starks  . CESAREAN SECTION    . EVALUATION UNDER ANESTHESIA WITH ANAL FISTULECTOMY N/A 08/09/2012   Procedure: EXAM UNDER ANESTHESIA AND BIOPSY OF ANAL MASS;  Surgeon: Odis Hollingshead, MD;  Location: [facility];  Service: General;  Laterality: N/A;  . KNEE ARTHROSCOPY     left knee  . PORTACATH PLACEMENT  03/2008   dr. Marlou Starks   . REMOVAL PORTACATH  2011    Current Outpatient Prescriptions  Medication Sig Dispense Refill  . anastrozole (ARIMIDEX) 1 MG tablet TAKE 1 TABLET BY MOUTH DAILY. 90 tablet 3  . calcium-vitamin D 250-100 MG-UNIT per tablet Take 1 tablet by mouth daily.     . Cholecalciferol (VITAMIN D) 1000 UNITS capsule Take 2,000 Units by mouth daily.     Marland Kitchen loratadine (CLARITIN) 10 MG tablet Take 10 mg by mouth as needed for allergies.    . Multiple Vitamins-Minerals (MULTIVITAMIN WITH MINERALS) tablet Take 1 tablet by mouth daily.     No current facility-administered medications for this visit.     Family History  Problem Relation Age of Onset  . Lung cancer Father   . Stroke Mother  ROS:  Pertinent items are noted in HPI.  Otherwise, a comprehensive ROS was negative.  Exam:   BP 106/60 (BP Location: Left Arm, Patient Position: Sitting, Cuff Size: Normal)   Pulse 90   Resp 14   Ht _0  (1.651 m)   Wt 160 lb (72.6 kg)   LMP 04/07/2008   BMI 26.63 kg/m   Weight change:   Height: _1  (165.1 cm)  Ht Readings from Last 3 Encounters:  04/07/16 _2  (1.651 m)  08/17/15 5' 4.5" (1.638 m)  08/12/15 5' 4.5" (1.638 m)    General appearance: alert, cooperative and appears stated age Head: Normocephalic, without obvious abnormality, atraumatic Neck: no adenopathy, supple, symmetrical, trachea midline and thyroid normal to inspection and palpation Lungs: clear to auscultation bilaterally Breasts: normal appearance, no masses or  tenderness, well healed scars, no LAD, no change from prior exam Heart: regular rate and rhythm Abdomen: soft, non-tender; bowel sounds normal; no masses,  no organomegaly Extremities: extremities normal, atraumatic, no cyanosis or edema Skin: Skin color, texture, turgor normal. No rashes or lesions Lymph nodes: Cervical, supraclavicular, and axillary nodes normal. No abnormal inguinal nodes palpated Neurologic: Grossly normal   Pelvic: External genitalia:  no lesions              Urethra:  normal appearing urethra with no masses, tenderness or lesions              Bartholins and Skenes: normal                 Vagina: normal appearing vagina with normal color and discharge, no lesions              Cervix: no lesions              Pap taken: Yes.   Bimanual Exam:  Uterus:  normal size, contour, position, consistency, mobility, non-tender              Adnexa: normal adnexa and no mass, fullness, tenderness               Rectovaginal: Confirms               Anus:  normal sphincter tone, no lesions  Chaperone was present for exam.  A:    Well woman exam H/O anal carcinoma.  S/p radiation.   H/O invasive ductal carcinoma of right breast 2009, s/p lumpectomy/SNB/Chemo/radiaiton.  Took Tamoxifen for 5 years.  Now on Arimidex for 5 additional years Death of son due to heroine overdose 2014.  Doing well from this standpoint Lung nodules, benign appearance, followed with yearly to every other year CT.  Last one was 6/15.  Followed by Dr. Benay Spice.    P:  Mammogram yearly. Doing diagnostic yearly. Neg HR HPV 2014.  Pt desires yearly pap smears.  Pap obtained today.Marland Kitchen   BMD every two years while on Arimidex.  Last done 12/30/14.  Due next year. Lipids, Hpe C antibody, CBC, CMP obtained today. Return annually or prn

## 2016-04-08 LAB — COMPREHENSIVE METABOLIC PANEL
ALT: 10 U/L (ref 6–29)
AST: 12 U/L (ref 10–35)
Albumin: 3.9 g/dL (ref 3.6–5.1)
Alkaline Phosphatase: 52 U/L (ref 33–130)
BUN: 14 mg/dL (ref 7–25)
CO2: 31 mmol/L (ref 20–31)
Calcium: 9 mg/dL (ref 8.6–10.4)
Chloride: 102 mmol/L (ref 98–110)
Creat: 0.71 mg/dL (ref 0.50–1.05)
Glucose, Bld: 129 mg/dL — ABNORMAL HIGH (ref 65–99)
Potassium: 4.1 mmol/L (ref 3.5–5.3)
Sodium: 141 mmol/L (ref 135–146)
Total Bilirubin: 0.5 mg/dL (ref 0.2–1.2)
Total Protein: 6.7 g/dL (ref 6.1–8.1)

## 2016-04-08 LAB — LIPID PANEL
Cholesterol: 224 mg/dL — ABNORMAL HIGH (ref <200)
HDL: 81 mg/dL (ref >50)
LDL Cholesterol: 130 mg/dL — ABNORMAL HIGH (ref <100)
Total CHOL/HDL Ratio: 2.8 Ratio (ref <5.0)
Triglycerides: 66 mg/dL (ref <150)
VLDL: 13 mg/dL (ref <30)

## 2016-04-08 LAB — HEPATITIS C ANTIBODY: HCV Ab: NEGATIVE

## 2016-04-10 LAB — IPS PAP TEST WITH REFLEX TO HPV

## 2016-04-21 DIAGNOSIS — S81812A Laceration without foreign body, left lower leg, initial encounter: Secondary | ICD-10-CM | POA: Diagnosis not present

## 2016-04-24 ENCOUNTER — Ambulatory Visit (INDEPENDENT_AMBULATORY_CARE_PROVIDER_SITE_OTHER): Payer: BLUE CROSS/BLUE SHIELD | Admitting: Licensed Clinical Social Worker

## 2016-04-24 DIAGNOSIS — F324 Major depressive disorder, single episode, in partial remission: Secondary | ICD-10-CM

## 2016-05-02 ENCOUNTER — Other Ambulatory Visit: Payer: Self-pay | Admitting: Nurse Practitioner

## 2016-05-11 ENCOUNTER — Telehealth: Payer: Self-pay | Admitting: Oncology

## 2016-05-11 NOTE — Telephone Encounter (Signed)
spk to patient to advise of appointment change from 05/18/16 at 3:30P to 06/02/16 at 10A

## 2016-05-18 ENCOUNTER — Ambulatory Visit: Payer: BLUE CROSS/BLUE SHIELD | Admitting: Oncology

## 2016-06-02 ENCOUNTER — Ambulatory Visit (HOSPITAL_BASED_OUTPATIENT_CLINIC_OR_DEPARTMENT_OTHER): Payer: BLUE CROSS/BLUE SHIELD | Admitting: Oncology

## 2016-06-02 VITALS — BP 111/59 | HR 80 | Temp 98.5°F | Resp 18 | Ht 65.0 in | Wt 158.7 lb

## 2016-06-02 DIAGNOSIS — R918 Other nonspecific abnormal finding of lung field: Secondary | ICD-10-CM

## 2016-06-02 DIAGNOSIS — Z85048 Personal history of other malignant neoplasm of rectum, rectosigmoid junction, and anus: Secondary | ICD-10-CM

## 2016-06-02 DIAGNOSIS — Z853 Personal history of malignant neoplasm of breast: Secondary | ICD-10-CM | POA: Diagnosis not present

## 2016-06-02 DIAGNOSIS — C210 Malignant neoplasm of anus, unspecified: Secondary | ICD-10-CM

## 2016-06-02 NOTE — Progress Notes (Signed)
[place] OFFICE PROGRESS NOTE   Diagnosis: Breast cancer, anal cancer  INTERVAL HISTORY:   Gina Cervantes was last seen at the [facility] in April 2017. She feels well. She continues Arimidex. No palpable change over either breast. A mammogram 10/20/2015 was negative. She reports having rectal examinations with her gynecologist and Dr. Clarise Cruz. She continues surveillance colonoscopies with Dr. Collene Mares, last in August 2017.  She discontinued smoking last fall. She is smoke just a few cigarettes over the past few months. She is concerned about carotid artery disease based on her family history.  Objective:  Vital signs in last 24 hours:  Blood pressure (!) 111/59, pulse 80, temperature 98.5 F (36.9 C), temperature source Oral, resp. rate 18, height '5\' 5"'  (1.651 m), weight 158 lb 11.2 oz (72 kg), last menstrual period 04/07/2008, SpO2 97 %.    HEENT: Neck without mass Lymphatics: No cervical, supra-clavicular, right axillary, or inguinal nodes. Soft mobile less than 1 cm left axillary node versus a prominent fat pad. Resp: Lungs clear bilaterally Cardio: Regular rate and rhythm GI: No hepatomegaly, nontender, no mass, she declined a rectal examination Vascular: No leg edema, 2+ bilateral carotid pulse without bruits Breasts: Status post right lumpectomy, no evidence for local tumor recurrence, left breast without mass     Medications: I have reviewed the patient's current medications.  Assessment/Plan: 1.Anal cancer-left anal margin/anal canal mass, status post a biopsy on 08/09/2012 confirming invasive moderately differentiated squamous cell carcinoma,p16 positive.  -Staging PET scan 08/20/2012-hypermetabolic anal mass, no hypermetabolic metastases  -Initiation of concurrent radiation with cycle 1 of mitomycin C./5-fluorouracil 09/02/2012.  -She completed cycle 2 5-fluorouracil/mitomycin C. beginning 10/01/2012.  -She completed radiation 10/10/2012.  2.  Right breast cancer 2009, ER positive, PR positive, HER-2 positive. She is status post neoadjuvant AC x4 cycles followed by Taxol/Herceptin. She underwent a right lumpectomy and sentinel lymph node biopsy 09/22/2008 with no evidence of residual invasive carcinoma and 5 negative sentinel lymph nodes. She then completed right breast radiation. She began tamoxifen 12/15/2008 and completed adjuvant Herceptin 05/25/2009. The tamoxifen was discontinued and she was switched to Arimidex in July 2013.  3. History of enlargement of the right tonsil.  4. Nonspecific pulmonary nodules without hypermetabolic activity on the PET scan 08/20/2012. Chest CT 10/21/2012 with scattered pulmonary nodules including nodules that were not present in 2010. Annual surveillance was recommended. Follow-up chest CT 10/24/2013 with scattered groundglass densities again noted in both lungs mostly unchanged when compared to the previous study. A sub-solid right lower lobe nodule had increased central density. The size was unchanged.The nodule has been present since 2009.    Disposition:  Ms. Cervantes remains in clinical remission from breast cancer and anal cancer. She plans to continue follow-up of the anal cancer with Dr. Zella Richer and Dr. Collene Mares. She has been maintained on adjuvant hormonal therapy since August 2010. She has completed an optimal course of hormonal therapy and feels comfortable discontinuing hormonal therapy at the end of July 2018.  Gina Cervantes has a 20 year smoking history. She agrees to a follow-up chest CT. I will refer her to the lung cancer screening program. I counseled her to discontinue smoking.  She does not wish to follow-up at the [facility]. She plans to follow-up with Dr. Sabra Heck. I am available to see her in the future as needed.  25 minutes were spent with the patient today. The majority of the time was used for counseling and coordination of care.  Betsy Coder,  MD  06/02/2016  11:06  AM

## 2016-06-08 DIAGNOSIS — C3412 Malignant neoplasm of upper lobe, left bronchus or lung: Secondary | ICD-10-CM

## 2016-06-08 HISTORY — DX: Malignant neoplasm of upper lobe, left bronchus or lung: C34.12

## 2016-06-13 ENCOUNTER — Ambulatory Visit
Admission: RE | Admit: 2016-06-13 | Discharge: 2016-06-13 | Disposition: A | Discharge status: Home / Self Care | Payer: BLUE CROSS/BLUE SHIELD | Source: Ambulatory Visit | Attending: Oncology | Admitting: Oncology

## 2016-06-13 DIAGNOSIS — R918 Other nonspecific abnormal finding of lung field: Secondary | ICD-10-CM | POA: Diagnosis not present

## 2016-06-13 DIAGNOSIS — C210 Malignant neoplasm of anus, unspecified: Secondary | ICD-10-CM

## 2016-06-13 MED ORDER — IOPAMIDOL (ISOVUE-300) INJECTION 61%
75.0000 mL | Freq: Once | INTRAVENOUS | Status: AC | PRN
  Administered 2016-06-13: 75 mL via INTRAVENOUS

## 2016-06-16 ENCOUNTER — Telehealth: Payer: Self-pay | Admitting: Oncology

## 2016-06-16 ENCOUNTER — Other Ambulatory Visit: Payer: Self-pay | Admitting: Oncology

## 2016-06-16 DIAGNOSIS — C210 Malignant neoplasm of anus, unspecified: Secondary | ICD-10-CM

## 2016-06-16 NOTE — Telephone Encounter (Signed)
lvm to inform pt of 2/16 appt at 1230 pm per LOS

## 2016-06-19 ENCOUNTER — Other Ambulatory Visit: Payer: Self-pay | Admitting: *Deleted

## 2016-06-19 MED ORDER — ANASTROZOLE 1 MG PO TABS: 1.0000 mg | ORAL_TABLET | Freq: Every day | ORAL | 3 refills | Status: DC

## 2016-06-21 ENCOUNTER — Other Ambulatory Visit: Payer: Self-pay | Admitting: *Deleted

## 2016-06-21 DIAGNOSIS — C210 Malignant neoplasm of anus, unspecified: Secondary | ICD-10-CM

## 2016-06-22 ENCOUNTER — Encounter (HOSPITAL_COMMUNITY)
Admission: RE | Admit: 2016-06-22 | Discharge: 2016-06-22 | Disposition: A | Discharge status: Home / Self Care | Payer: BLUE CROSS/BLUE SHIELD | Source: Ambulatory Visit | Attending: Oncology | Admitting: Oncology

## 2016-06-22 DIAGNOSIS — C210 Malignant neoplasm of anus, unspecified: Secondary | ICD-10-CM | POA: Insufficient documentation

## 2016-06-22 DIAGNOSIS — C50919 Malignant neoplasm of unspecified site of unspecified female breast: Secondary | ICD-10-CM | POA: Diagnosis not present

## 2016-06-22 LAB — GLUCOSE, CAPILLARY: Glucose-Capillary: 87 mg/dL (ref 65–99)

## 2016-06-22 MED ORDER — FLUDEOXYGLUCOSE F - 18 (FDG) INJECTION
7.7300 | Freq: Once | INTRAVENOUS | Status: AC | PRN
  Administered 2016-06-22: 7.73 via INTRAVENOUS

## 2016-06-23 ENCOUNTER — Encounter: Payer: Self-pay | Admitting: *Deleted

## 2016-06-23 ENCOUNTER — Ambulatory Visit (HOSPITAL_BASED_OUTPATIENT_CLINIC_OR_DEPARTMENT_OTHER): Payer: BLUE CROSS/BLUE SHIELD | Admitting: Oncology

## 2016-06-23 VITALS — BP 124/77 | HR 93 | Temp 98.7°F | Resp 18 | Ht 65.0 in | Wt 161.4 lb

## 2016-06-23 DIAGNOSIS — R911 Solitary pulmonary nodule: Secondary | ICD-10-CM

## 2016-06-23 DIAGNOSIS — Z853 Personal history of malignant neoplasm of breast: Secondary | ICD-10-CM

## 2016-06-23 DIAGNOSIS — Z85048 Personal history of other malignant neoplasm of rectum, rectosigmoid junction, and anus: Secondary | ICD-10-CM | POA: Diagnosis not present

## 2016-06-23 DIAGNOSIS — C210 Malignant neoplasm of anus, unspecified: Secondary | ICD-10-CM

## 2016-06-23 NOTE — Progress Notes (Signed)
[place] OFFICE PROGRESS NOTE   Diagnosis: Anal cancer, breast cancer  INTERVAL HISTORY:   Gina Cervantes returns to review the CT and PET findings. I discussed the 06/13/2016 CT findings with her by telephone. She will schedule an appointment with Dr. Zella Richer for as soon as possible. She prefers he perform her surveillance anal exam. No new complaint other than anxiety surrounding the CT findings.  She continues Arimidex.  Objective:  Vital signs in last 24 hours:  Blood pressure 124/77, pulse 93, temperature 98.7 F (37.1 C), temperature source Oral, resp. rate 18, height '5\' 5"'  (1.651 m), weight 161 lb 6.4 oz (73.2 kg), last menstrual period 04/07/2008, SpO2 95 %.    Physical examination-not performed today    Imaging:  Nm Pet Image Initial (pi) Skull Base To Thigh  Result Date: 06/22/2016 CLINICAL DATA:  Subsequent treatment strategy for breast and anal cancer. New left lung nodule. EXAM: NUCLEAR MEDICINE PET SKULL BASE TO THIGH TECHNIQUE: 7.7 mCi F-18 FDG was injected intravenously. Full-ring PET imaging was performed from the skull base to thigh after the radiotracer. CT data was obtained and used for attenuation correction and anatomic localization. FASTING BLOOD GLUCOSE:  Value: 87 mg/dl COMPARISON:  08/20/2012 FINDINGS: NECK No hypermetabolic lymph nodes in the neck. CHEST Aortic atherosclerosis. Calcification involving the LAD coronary artery. No hypermetabolic mediastinal or hilar nodes. Left upper lobe lung nodule measures 1 cm. This exhibits malignant range FDG uptake within SUV max equal to 7.3, image 25 of series 8. No additional hypermetabolic pulmonary nodules identified. This includes the irregular-shaped 1.4 x 1.8 cm nodular density in the right lower lobe which has an SUV max equal to 1.35, image 41 of series 8. ABDOMEN/PELVIS No abnormal hypermetabolic activity within the liver, pancreas, adrenal glands, or spleen. Left renal calculus. Aortic  atherosclerosis. No hypermetabolic lymph nodes in the abdomen or pelvis. SKELETON No focal hypermetabolic activity to suggest skeletal metastasis. IMPRESSION: 1. Malignant range FDG uptake is associated with the solid 11 mm nodule within the left upper lobe which may represent metastatic disease or attack hernias primary bronchogenic carcinoma. Correlation with tissue sampling is advised. 2. No additional hypermetabolic pulmonary nodules are identified at this time including the irregularly-shaped nodule within the right lower lobe. 3. Kidney stone. 4. Aortic atherosclerosis and coronary artery calcification. Electronically Signed   By: Kerby Moors M.D.   On: 06/22/2016 09:27    Medications: I have reviewed the patient's current medications.  Assessment/Plan: 1.Anal cancer-left anal margin/anal canal mass, status post a biopsy on 08/09/2012 confirming invasive moderately differentiated squamous cell carcinoma,p16 positive.  -Staging PET scan 08/20/2012-hypermetabolic anal mass, no hypermetabolic metastases  -Initiation of concurrent radiation with cycle 1 of mitomycin C./5-fluorouracil 09/02/2012.  -She completed cycle 2 5-fluorouracil/mitomycin C. beginning 10/01/2012.  -She completed radiation 10/10/2012.  2. Right breast cancer 2009, ER positive, PR positive, HER-2 positive. She is status post neoadjuvant AC x4 cycles followed by Taxol/Herceptin. She underwent a right lumpectomy and sentinel lymph node biopsy 09/22/2008 with no evidence of residual invasive carcinoma and 5 negative sentinel lymph nodes. She then completed right breast radiation. She began tamoxifen 12/15/2008 and completed adjuvant Herceptin 05/25/2009. The tamoxifen was discontinued and she was switched to Arimidex in July 2013.  3. History of enlargement of the right tonsil.  4. Nonspecific pulmonary nodules without hypermetabolic activity on the PET scan 08/20/2012. Chest CT 10/21/2012 with scattered pulmonary nodules  including nodules that were not present in 2010. Annual surveillance was recommended. Follow-up chest CT  10/24/2013 with scattered groundglass densities again noted in both lungs mostly unchanged when compared to the previous study. A sub-solid right lower lobe nodule had increased central density. The size was unchanged.The nodule has been present since 2009.  CT chest 06/13/2016-a sub-solid 9 mm right upper lobe nodule has enlarged compared to 2015, a right lower lobe irregular nodular density appears more well defined, new vague 5 mm groundglass left upper lobe nodule, left lower lobe 10 mm groundglass nodule is more apparent, posterior left upper lobe circumscribed solid nodule measures 11 x 10 mm and is new  PET scan 06/22/2016-malignant range activity involving the 11 mm left upper lobe nodule, no other hypermetabolic nodules    Disposition:  [name] has a history of anal cancer and breast cancer. She has a smoking history. I reviewed the CT and PET images with her. There is a new posterior left upper lobe solid nodule compared to 2015 that is hypermetabolic. This lesion may represent a metastasis from either breast or anal cancer. The differential diagnosis includes a primary bronchogenic carcinoma.  Other lung nodules are not hypermetabolic, but could also represent metastases or primary tumors.  I recommend proceeding with excision of the hypermetabolic left upper lobe nodule. We will refer her to Dr. Servando Snare.  I will present her case at the GI tumor conference on 06/28/2016. I will see her surgery to review the pathology and consider the indication for adjuvant therapy.  25 minutes were spent with the patient today. The majority of the time was used for counseling and coordination of care.  Betsy Coder, MD  06/23/2016  1:27 PM

## 2016-06-23 NOTE — Progress Notes (Signed)
Oncology Nurse Navigator Documentation  Oncology Nurse Navigator Flowsheets 06/23/2016  Navigator Location [facility]  Navigator Encounter Type Clinic/MDC  Patient Visit Type MedOnc/per Dr. Benay Spice, I spoke with Gina Cervantes today.  Dr. Benay Spice would like her to see Dr. Servando Snare for pulmonary nodule evaluation. I spoke with [facility] and was given an appt. I gave this appt to the patient with a map of their office location.    Treatment Phase Abnormal Scans  Barriers/Navigation Needs Coordination of Care  Interventions Coordination of Care  Coordination of Care Appts  Acuity Level 2  Acuity Level 2 Assistance expediting appointments  Time Spent with Patient 30

## 2016-06-28 ENCOUNTER — Institutional Professional Consult (permissible substitution) (INDEPENDENT_AMBULATORY_CARE_PROVIDER_SITE_OTHER): Payer: BLUE CROSS/BLUE SHIELD | Admitting: Cardiothoracic Surgery

## 2016-06-28 VITALS — BP 119/75 | HR 90 | Resp 20 | Ht 65.0 in | Wt 144.0 lb

## 2016-06-28 DIAGNOSIS — R911 Solitary pulmonary nodule: Secondary | ICD-10-CM

## 2016-06-28 DIAGNOSIS — Z853 Personal history of malignant neoplasm of breast: Secondary | ICD-10-CM

## 2016-06-28 NOTE — Progress Notes (Signed)
[address]       [place] [address]             [phone]                    Chelsey O Tibbetts [facility] Medical Record #[id number] Date of Birth: [birth year]  Referring: Ladell Pier, MD Primary Care: No PCP Per Patient  Chief Complaint:    Chief Complaint  Patient presents with  . Lung Lesion    needs Tissue BX. HX of Breast CA    History of Present Illness:    Gina Cervantes 56 y.o. female is seen in the office  today , at the request of Dr. Benay Spice. The patient has a significant history of previous malignancy including, Breast and anal cancer.   Anal cancer-left anal margin/anal canal mass, status post a biopsy on 08/09/2012 confirming invasive moderately differentiated squamous cell carcinoma,p16 positive.  -Staging PET scan 08/20/2012-hypermetabolic anal mass, no hypermetabolic metastases  -Initiation of concurrent radiation with cycle 1 of mitomycin C./5-fluorouracil 09/02/2012.  -She completed cycle 2 5-fluorouracil/mitomycin C. beginning 10/01/2012.  -She completed radiation 10/10/2012  Right breast cancer 2009, ER positive, PR positive, HER-2 positive. She is status post neoadjuvant AC x4 cycles followed by Taxol/Herceptin. She underwent a right lumpectomy and sentinel lymph node biopsy 09/22/2008 with no evidence of residual invasive carcinoma and 5 negative sentinel lymph nodes. She then completed right breast radiation. She began tamoxifen 12/15/2008 and completed adjuvant Herceptin 05/25/2009. The tamoxifen was discontinued and she was switched to Arimidex in July 2013   on follow-up the patient had a CT scan in January and a subsequent PET scan that showed a sub-solid right lower lobe nodule that had increased in density and a new left upper lobe hypermetabolic lesion highly suggestive of malignancy .  The patient is a long-term smoker and continues to intermittently smoke at times of stress .  Current Activity/ Functional  Status:  Patient is independent with mobility/ambulation, transfers, ADL's, IADL's.   Zubrod Score: At the time of surgery this patient's most appropriate activity status/level should be described as: '[]'     0    Normal activity, no symptoms '[]'     1    Restricted in physical strenuous activity but ambulatory, able to do out light work '[]'     2    Ambulatory and capable of self care, unable to do work activities, up and about               >50 % of waking hours                              '[]'     3    Only limited self care, in bed greater than 50% of waking hours '[]'     4    Completely disabled, no self care, confined to bed or chair '[]'     5    Moribund   Past Medical History:  Diagnosis Date  . Anal cancer ([facility]) 08/09/2012   Squamous cell  . Anxiety    PANIC ATTACKS  . Breast cancer ([facility]) 2009   ER+PR+HER-2+  . Ductal carcinoma ([facility]) 02/2008    invasive   . History of radiation therapy 09/02/12-10/10/12   anal 50.4Gy  . History of shingles 10/2014   . HPV (human papilloma virus) anogenital infection    vulvar/ freezing hx  . Pneumonia  NO RECENT PROBLEMS  . Radiation 10/21/08-12/08/08   Right breast 6240 cGy  . Status post chemotherapy 02/2008   Taxol/Herceptin    Past Surgical History:  Procedure Laterality Date  . BREAST LUMPECTOMY WITH AXILLARY LYMPH NODE BIOPSY Right 5/10   Dr. Marlou Starks  . CESAREAN SECTION    . EVALUATION UNDER ANESTHESIA WITH ANAL FISTULECTOMY N/A 08/09/2012   Procedure: EXAM UNDER ANESTHESIA AND BIOPSY OF ANAL MASS;  Surgeon: Odis Hollingshead, MD;  Location: [facility];  Service: General;  Laterality: N/A;  . KNEE ARTHROSCOPY     left knee  . PORTACATH PLACEMENT  03/2008   dr. Marlou Starks   . REMOVAL PORTACATH  2011    Family History  Problem Relation Age of Onset  . Lung cancer Father   . Stroke Mother     Social History   Social History  . Marital status: Married    Spouse name: N/A  . Number of children: N/A  . Years of education: N/A   Occupational  History  .  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 Smoking status: Current Some Day Smoker    Years: 8.00    Types: Cigarettes    Last attempt to quit: 06/05/2013  . Smokeless tobacco: Never Used     Comment: smokes 3-5 per week   . Alcohol use 0.0 - 6.0 oz/week  . Drug use: No  . Sexual activity: Yes    Partners: Male    Birth control/ protection: Other-see comments     Comment: vasectomy   Other Topics Concern  . Not on file   Social History Narrative   Married   Lost a son age 92 this year and husband's brother, still going through grieving process.   No family history of breast or ovarian cancer.   Employed as Realtor    History  Smoking Status  . Current Some Day Smoker  . Years: 8.00  . Types: Cigarettes  . Last attempt to quit: 06/05/2013  Smokeless Tobacco  . Never Used    Comment: smokes 3-5 per week     History  Alcohol Use  . 0.0 - 6.0 oz/week     No Known Allergies  Current Outpatient Prescriptions  Medication Sig Dispense Refill  . anastrozole (ARIMIDEX) 1 MG tablet Take 1 tablet (1 mg total) by mouth daily. 90 tablet 3  . loratadine (CLARITIN) 10 MG tablet Take 10 mg by mouth as needed for allergies.    Marland Kitchen LORazepam (ATIVAN) 1 MG tablet Take 1 tablet (1 mg total) by mouth every 8 (eight) hours. (Patient taking differently: Take 1 mg by mouth every 8 (eight) hours as needed for anxiety. ) 30 tablet 1  . Multiple Vitamins-Minerals (MULTIVITAMIN WITH MINERALS) tablet Take 1 tablet by mouth daily.    Marland Kitchen CALCIUM PO Take 1 tablet by mouth daily.    . naproxen sodium (ANAPROX) 220 MG tablet Take 220 mg by mouth 2 (two) times daily as needed (for pain.).     No current facility-administered medications for this visit.       Review of Systems:     Cardiac Review of Systems: Y or N  Chest Pain [  n  ]  Resting SOB [ n  ] Exertional SOB  [ n ]  Orthopnea [n  ]   Pedal Edema [n   ]    Palpitations [n  ] Syncope  Florencio.Farrier   ]  Presyncope [n   ]  General Review of Systems: [Y] = yes [  ]=no Constitional: recent weight change [n  ];  Wt loss over the last 3 months [   ] anorexia [  ]; fatigue [  ]; nausea [  ]; night sweats [n  ]; fever [  ]; or chills [  ];          Dental: poor dentition[  ]; Last Dentist visit:   Eye : blurred vision [  ]; diplopia [   ]; vision changes [  ];  Amaurosis fugax[  ]; Resp: cough [  ];  wheezing[  ];  hemoptysis[  ]; shortness of breath[  ]; paroxysmal nocturnal dyspnea[  ]; dyspnea on exertion[  ]; or orthopnea[  ];  GI:  gallstones[  ], vomiting[  ];  dysphagia[  ]; melena[  ];  hematochezia [  ]; heartburn[  ];   Hx of  Colonoscopy[  ]; GU: kidney stones [  ]; hematuria[  ];   dysuria [  ];  nocturia[  ];  history of     obstruction [  ]; urinary frequency [  ]             Skin: rash, swelling[  ];, hair loss[  ];  peripheral edema[  ];  or itching[  ]; Musculosketetal: myalgias[  ];  joint swelling[  ];  joint erythema[  ];  joint pain[  ];  back pain[  ];  Heme/Lymph: bruising[  ];  bleeding[  ];  anemia[  ];  Neuro: TIA[ n ];  headaches[  ];  stroke[ n ];  vertigo[  ];  seizures[  ];   paresthesias[  ];  difficulty walking[ n ];  Psych:depression[  ]; anxiety[  ];  Endocrine: diabetes[  ];  thyroid dysfunction[  ];  Immunizations: Flu up to date [  ]; Pneumococcal up to date [  ];  Other: Patient denies any current symptoms of her rectal cancer  Physical Exam: BP 119/75   Pulse 90   Resp 20   Ht '5\' 5"'  (1.651 m)   Wt 144 lb (65.3 kg)   LMP 04/07/2008   SpO2 94% Comment: RA  BMI 23.96 kg/m   PHYSICAL EXAMINATION: General appearance: alert, cooperative and appears stated age Head: Normocephalic, without obvious abnormality, atraumatic Neck: no adenopathy, no carotid bruit, no JVD, supple, symmetrical, trachea midline and thyroid not enlarged, symmetric, no tenderness/mass/nodules Lymph nodes: Cervical, supraclavicular, and axillary nodes normal. Resp: clear to  auscultation bilaterally Back: symmetric, no curvature. ROM normal. No CVA tenderness. Cardio: regular rate and rhythm, S1, S2 normal, no murmur, click, rub or gallop GI: soft, non-tender; bowel sounds normal; no masses,  no organomegaly Extremities: extremities normal, atraumatic, no cyanosis or edema and Homans sign is negative, no sign of DVT Neurologic: Grossly normal  Diagnostic Studies & Laboratory data:     Recent Radiology Findings:   Ct Chest W Contrast  Result Date: 06/13/2016 CLINICAL DATA:  Right lower lobe pulmonary nodule. Anal cancer diagnosed 4/14. Radiation therapy. Breast cancer in 2010 with chemotherapy and radiation therapy after lumpectomy. EXAM: CT CHEST WITH CONTRAST TECHNIQUE: Multidetector CT imaging of the chest was performed during intravenous contrast administration. CONTRAST:  26m ISOVUE-300 IOPAMIDOL (ISOVUE-300) INJECTION 61% COMPARISON:  10/24/2013 FINDINGS: Cardiovascular: Aortic and branch vessel atherosclerosis. Normal heart size, without pericardial effusion. Multivessel coronary artery atherosclerosis. No central pulmonary embolism, on this non-dedicated study. Mediastinum/Nodes: No supraclavicular adenopathy. No mediastinal adenopathy. Borderline  right hilar adenopathy at 11 mm on image 67/series 2 is felt to be similar. Lungs/Pleura: No pleural fluid. Somewhat ill-defined, possibly sub solid pulmonary nodule measures 9 mm in the right upper lobe posteriorly. Significantly enlarged since the prior. Example image 39/ series 3. An irregular right lower lobe nodular density measures on the order of 1.8 x 1.4 cm on image 87/series 2. Appears more well-defined today than on the prior exam, where it measured 1.4 cm. This difference could be secondary to thinner slice collimation today. 3 mm posterior right upper lobe subpleural pulmonary nodule on image 64/ series 3 is unchanged and can be considered benign. Right upper lobe 6 mm ground-glass nodule is felt to be  unchanged, including on image 43/series 3. A 4 mm ill-defined posterior right upper lobe nodule on image 45/ series 3 is also felt to be similar, given differences in slice thickness. Sub solid 5 mm left upper lobe pulmonary nodule on image 29/series 3 is unchanged. Vague left upper lobe 5 mm ground-glass nodule on image 73/series 3, likely new. Left lower lobe 10 mm ground-glass nodule on image 92/series 3 is more apparent today than on the prior, where it measured on the order of 5 mm. A posterior left upper lobe relatively well-circumscribed solid nodule measures 11 x 10 mm on image 54/ series 3 and is new. Upper Abdomen: Tiny well-circumscribed low-density liver lesions are likely cysts and were present previously. Normal imaged portions of the spleen, stomach, pancreas, gallbladder, biliary tract, adrenal glands, kidneys. Abdominal aortic atherosclerosis. Musculoskeletal: No axillary or subpectoral adenopathy. No acute osseous abnormality. IMPRESSION: 1. A posterior left upper lobe solid 11 mm nodule is suspicious for metastatic disease versus less likely attack metachronous primary bronchogenic carcinoma. Consider tissue sampling versus further evaluation with PET. 2. Other solid and sub solid pulmonary nodules are primarily similar. 10 mm left lower lobe and 9 mm right upper lobe sub solid nodules have enlarged. Apparent increase in size of a solid right lower lobe nodule, favored to be due to thinner slice collimation today. 3. No thoracic adenopathy. 4. Age advanced coronary artery atherosclerosis. Recommend assessment of coronary risk factors and consideration of medical therapy. Electronically Signed   By: Abigail Miyamoto M.D.   On: 06/13/2016 14:36   Nm Pet Image Initial (pi) Skull Base To Thigh  Result Date: 06/22/2016 CLINICAL DATA:  Subsequent treatment strategy for breast and anal cancer. New left lung nodule. EXAM: NUCLEAR MEDICINE PET SKULL BASE TO THIGH TECHNIQUE: 7.7 mCi F-18 FDG was injected  intravenously. Full-ring PET imaging was performed from the skull base to thigh after the radiotracer. CT data was obtained and used for attenuation correction and anatomic localization. FASTING BLOOD GLUCOSE:  Value: 87 mg/dl COMPARISON:  08/20/2012 FINDINGS: NECK No hypermetabolic lymph nodes in the neck. CHEST Aortic atherosclerosis. Calcification involving the LAD coronary artery. No hypermetabolic mediastinal or hilar nodes. Left upper lobe lung nodule measures 1 cm. This exhibits malignant range FDG uptake within SUV max equal to 7.3, image 25 of series 8. No additional hypermetabolic pulmonary nodules identified. This includes the irregular-shaped 1.4 x 1.8 cm nodular density in the right lower lobe which has an SUV max equal to 1.35, image 41 of series 8. ABDOMEN/PELVIS No abnormal hypermetabolic activity within the liver, pancreas, adrenal glands, or spleen. Left renal calculus. Aortic atherosclerosis. No hypermetabolic lymph nodes in the abdomen or pelvis. SKELETON No focal hypermetabolic activity to suggest skeletal metastasis. IMPRESSION: 1. Malignant range FDG uptake is associated with the solid  11 mm nodule within the left upper lobe which may represent metastatic disease or attack hernias primary bronchogenic carcinoma. Correlation with tissue sampling is advised. 2. No additional hypermetabolic pulmonary nodules are identified at this time including the irregularly-shaped nodule within the right lower lobe. 3. Kidney stone. 4. Aortic atherosclerosis and coronary artery calcification. Electronically Signed   By: Kerby Moors M.D.   On: 06/22/2016 09:27     I have independently reviewed the above radiologic studies.  Recent Lab Findings: Lab Results  Component Value Date   WBC 5.8 04/07/2016   HGB 14.7 04/07/2016   HCT 44.0 04/07/2016   PLT 254 04/07/2016   GLUCOSE 129 (H) 04/07/2016   CHOL 224 (H) 04/07/2016   TRIG 66 04/07/2016   HDL 81 04/07/2016   LDLCALC 130 (H) 04/07/2016   ALT  10 04/07/2016   AST 12 04/07/2016   NA 141 04/07/2016   K 4.1 04/07/2016   CL 102 04/07/2016   CREATININE 0.71 04/07/2016   BUN 14 04/07/2016   CO2 31 04/07/2016   TSH 3.173 12/25/2014   INR 1.01 08/02/2012      Assessment / Plan:   The patient appears to have a new left upper lobe lung lesion markedly hypermetabolic that has not been present on previous CT scans, and highly suspicious for malignancy. It's unclear if this is an isolated pulmonary metastasis from breast cancer anal cancer or a possible primary lung cancer. It does appear radiographically different and has behaved biologically different than the sub-solid lesion in the right lung. The patient was presented at the multidisciplinary thoracic oncology conference and the GI conference. There several approaches and after discussion with Dr. Benay Spice and the patient will obtain pulmonary function studies and consider left video-assisted thoracoscopy wedge resection and biopsy of the left upper lobe lesion following this we will address the abnormalities in the right lung which appear distinct radio graphically.  The patient will obtain pulmonary function studies and return early next week to further discuss the treatment options and the pros and cons of attempting needle biopsy or navigation bronchoscopy with biopsy or proceeding with excisional biopsy/resection of the left lung lesion.      I  spent 40 minutes counseling the patient face to face and 50% or more the  time was spent in counseling and coordination of care. The total time spent in the appointment was 60 minutes.  [name] MD      [address] [place] [address] Office [phone]   Beeper [phone]  06/29/2016 5:27 PM

## 2016-06-29 ENCOUNTER — Other Ambulatory Visit: Payer: Self-pay | Admitting: *Deleted

## 2016-06-29 ENCOUNTER — Telehealth: Payer: Self-pay | Admitting: *Deleted

## 2016-06-29 ENCOUNTER — Telehealth: Payer: Self-pay | Admitting: Oncology

## 2016-06-29 DIAGNOSIS — R911 Solitary pulmonary nodule: Secondary | ICD-10-CM

## 2016-06-29 NOTE — Telephone Encounter (Addendum)
Call from pt requesting call from Dr. Benay Spice. Returned call, questions answered. Pt has seen Dr. Servando Snare, has some testing next week and is scheduled for surgery on 2/28. Dr. Zella Richer had no openings until 3/1, pt will call for new appt with Dr. Zella Richer.

## 2016-06-29 NOTE — Telephone Encounter (Signed)
Spoke to patient and confirmed appointments

## 2016-06-30 ENCOUNTER — Ambulatory Visit (HOSPITAL_COMMUNITY)
Admission: RE | Admit: 2016-06-30 | Discharge: 2016-06-30 | Disposition: A | Discharge status: Home / Self Care | Payer: BLUE CROSS/BLUE SHIELD | Source: Ambulatory Visit | Attending: Cardiothoracic Surgery | Admitting: Cardiothoracic Surgery

## 2016-06-30 DIAGNOSIS — R911 Solitary pulmonary nodule: Secondary | ICD-10-CM | POA: Diagnosis not present

## 2016-06-30 DIAGNOSIS — J449 Chronic obstructive pulmonary disease, unspecified: Secondary | ICD-10-CM | POA: Insufficient documentation

## 2016-06-30 LAB — PULMONARY FUNCTION TEST
DL/VA % pred: 103 %
DL/VA: 5.09 ml/min/mmHg/L
DLCO unc % pred: 69 %
DLCO unc: 17.68 ml/min/mmHg
FEF 25-75 Post: 2.44 L/sec
FEF 25-75 Pre: 1.05 L/sec
FEF2575-%Change-Post: 132 %
FEF2575-%Pred-Post: 91 %
FEF2575-%Pred-Pre: 39 %
FEV1-%Change-Post: 26 %
FEV1-%Pred-Post: 66 %
FEV1-%Pred-Pre: 52 %
FEV1-Post: 1.88 L
FEV1-Pre: 1.48 L
FEV1FVC-%Change-Post: 0 %
FEV1FVC-%Pred-Pre: 92 %
FEV6-%Change-Post: 23 %
FEV6-%Pred-Post: 71 %
FEV6-%Pred-Pre: 58 %
FEV6-Post: 2.48 L
FEV6-Pre: 2.02 L
FEV6FVC-%Change-Post: -2 %
FEV6FVC-%Pred-Post: 99 %
FEV6FVC-%Pred-Pre: 102 %
FVC-%Change-Post: 26 %
FVC-%Pred-Post: 71 %
FVC-%Pred-Pre: 56 %
FVC-Post: 2.57 L
FVC-Pre: 2.03 L
Post FEV1/FVC ratio: 73 %
Post FEV6/FVC ratio: 97 %
Pre FEV1/FVC ratio: 73 %
Pre FEV6/FVC Ratio: 100 %
RV % pred: 152 %
RV: 2.9 L
TLC % pred: 98 %
TLC: 5.14 L

## 2016-06-30 MED ORDER — ALBUTEROL SULFATE (2.5 MG/3ML) 0.083% IN NEBU
2.5000 mg | INHALATION_SOLUTION | Freq: Once | RESPIRATORY_TRACT | Status: AC
  Administered 2016-06-30: 2.5 mg via RESPIRATORY_TRACT

## 2016-07-03 ENCOUNTER — Ambulatory Visit (INDEPENDENT_AMBULATORY_CARE_PROVIDER_SITE_OTHER): Payer: BLUE CROSS/BLUE SHIELD | Admitting: Cardiothoracic Surgery

## 2016-07-03 ENCOUNTER — Encounter: Payer: Self-pay | Admitting: Cardiothoracic Surgery

## 2016-07-03 VITALS — BP 112/70 | Resp 16 | Ht 65.0 in | Wt 144.0 lb

## 2016-07-03 DIAGNOSIS — Z853 Personal history of malignant neoplasm of breast: Secondary | ICD-10-CM | POA: Diagnosis not present

## 2016-07-03 DIAGNOSIS — R911 Solitary pulmonary nodule: Secondary | ICD-10-CM | POA: Diagnosis not present

## 2016-07-03 MED ORDER — ALBUTEROL SULFATE HFA 108 (90 BASE) MCG/ACT IN AERS: 2.0000 | INHALATION_SPRAY | Freq: Four times a day (QID) | RESPIRATORY_TRACT | 2 refills | Status: DC | PRN

## 2016-07-03 NOTE — Progress Notes (Signed)
[address]       [place] Cervantes [address]             [phone]                    [name] [place] Medical Record #[id number] Date of Birth: 08-Jul-1960  Referring: Ladell Pier, MD Primary Care: No PCP Per Patient  Chief Complaint:    Chief Complaint  Patient presents with  . Lung Lesion    further discuss surgery scheduled for 07/05/16, reveiw PFT's 06/30/16    History of Present Illness:    Gina Cervantes 56 y.o. female is seen in the office last week and  today , at the request of Dr. Benay Spice. The patient has a significant history of previous malignancy including, Breast and anal cancer.   Anal cancer-left anal margin/anal canal mass, status post a biopsy on 08/09/2012 confirming invasive moderately differentiated squamous cell carcinoma,p16 positive.  -Staging PET scan 08/20/2012-hypermetabolic anal mass, no hypermetabolic metastases  -Initiation of concurrent radiation with cycle 1 of mitomycin C./5-fluorouracil 09/02/2012.  -She completed cycle 2 5-fluorouracil/mitomycin C. beginning 10/01/2012.  -She completed radiation 10/10/2012  Right breast cancer 2009, ER positive, PR positive, HER-2 positive. She is status post neoadjuvant AC x4 cycles followed by Taxol/Herceptin. She underwent a right lumpectomy and sentinel lymph node biopsy 09/22/2008 with no evidence of residual invasive carcinoma and 5 negative sentinel lymph nodes. She then completed right breast radiation. She began tamoxifen 12/15/2008 and completed adjuvant Herceptin 05/25/2009. The tamoxifen was discontinued and she was switched to Arimidex in July 2013  On  follow-up the patient had a CT scan in January and a subsequent PET scan that showed a sub-solid right lower lobe nodule that had increased in density over several years and a new left upper lobe hypermetabolic lesion highly suggestive of malignancy .  The patient is a long-term smoker and continues to  intermittently smoke at times of stress .  Current Activity/ Functional Status:  Patient is independent with mobility/ambulation, transfers, ADL's, IADL's.   Zubrod Score: At the time of surgery this patient's most appropriate activity status/level should be described as: _0     0    Normal activity, no symptoms _1     1    Restricted in physical strenuous activity but ambulatory, able to do out light work _2     2    Ambulatory and capable of self care, unable to do work activities, up and about               >50 % of waking hours                              _3     3    Only limited self care, in bed greater than 50% of waking hours _4     4    Completely disabled, no self care, confined to bed or chair _5     5    Moribund   Past Medical History:  Diagnosis Date  . Anal cancer ([place]) 08/09/2012   Squamous cell  . Anxiety    PANIC ATTACKS  . Breast cancer ([place]) 2009   ER+PR+HER-2+  . Ductal carcinoma ([facility]) 02/2008    invasive   . History of radiation therapy 09/02/12-10/10/12   anal 50.4Gy  . History of shingles 10/2014   . HPV (human papilloma virus) anogenital infection  vulvar/ freezing hx  . Pneumonia    NO RECENT PROBLEMS  . Radiation 10/21/08-12/08/08   Right breast 6240 cGy  . Status post chemotherapy 02/2008   Taxol/Herceptin    Past Surgical History:  Procedure Laterality Date  . BREAST LUMPECTOMY WITH AXILLARY LYMPH NODE BIOPSY Right 5/10   Dr. Marlou Starks  . CESAREAN SECTION    . EVALUATION UNDER ANESTHESIA WITH ANAL FISTULECTOMY N/A 08/09/2012   Procedure: EXAM UNDER ANESTHESIA AND BIOPSY OF ANAL MASS;  Surgeon: Odis Hollingshead, MD;  Location: [facility];  Service: General;  Laterality: N/A;  . KNEE ARTHROSCOPY     left knee  . PORTACATH PLACEMENT  03/2008   dr. Marlou Starks   . REMOVAL PORTACATH  2011    Family History  Problem Relation Age of Onset  . Lung cancer Father   . Stroke Mother     Social History   Social History  . Marital status: Married    Spouse name: N/A   . Number of children: N/A  . Years of education: N/A   Occupational History  .  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 Smoking status: Current Some Day Smoker    Years: 8.00    Types: Cigarettes    Last attempt to quit: 06/05/2013  . Smokeless tobacco: Never Used     Comment: smokes 3-5 per week   . Alcohol use 0.0 - 6.0 oz/week  . Drug use: No  . Sexual activity: Yes    Partners: Male    Birth control/ protection: Other-see comments     Comment: vasectomy    Social History Narrative   Married   Lost a son age 70 this year and husband's brother, still going through grieving process.   No family history of breast or ovarian cancer.   Employed as Realtor    History  Smoking Status  . Current Some Day Smoker  . Years: 8.00  . Types: Cigarettes  . Last attempt to quit: 06/05/2013  Smokeless Tobacco  . Never Used    Comment: smokes 3-5 per week     History  Alcohol Use  . 0.0 - 6.0 oz/week     No Known Allergies  Current Outpatient Prescriptions  Medication Sig Dispense Refill  . anastrozole (ARIMIDEX) 1 MG tablet Take 1 tablet (1 mg total) by mouth daily. 90 tablet 3  . CALCIUM PO Take 1 tablet by mouth daily.    Marland Kitchen loratadine (CLARITIN) 10 MG tablet Take 10 mg by mouth as needed for allergies.    Marland Kitchen LORazepam (ATIVAN) 1 MG tablet Take 1 tablet (1 mg total) by mouth every 8 (eight) hours. (Patient taking differently: Take 1 mg by mouth every 8 (eight) hours as needed for anxiety. ) 30 tablet 1  . Multiple Vitamins-Minerals (MULTIVITAMIN WITH MINERALS) tablet Take 1 tablet by mouth daily.    . naproxen sodium (ANAPROX) 220 MG tablet Take 220 mg by mouth 2 (two) times daily as needed (for pain.).     No current facility-administered medications for this visit.       Review of Systems:     Cardiac Review of Systems: Y or N  Chest Pain   n    Resting SOB  n   Exertional SOB   n   Orthopnea n     Pedal Edema n         Palpitations n   Syncope  n  Presyncope n     General Review of Systems: Y = yes   =no Constitional: recent weight change n  ;  Wt loss over the last 3 months     anorexia   ; fatigue   ; nausea   ; night sweats n  ; fever   ; or chills   ;          Dental: poor dentition  ; Last Dentist visit:   Eye : blurred vision   ; diplopia    ; vision changes   ;  Amaurosis fugax  ; Resp: cough   ;  wheezing  ;  hemoptysis  ; shortness of breath  ; paroxysmal nocturnal dyspnea  ; dyspnea on exertion  ; or orthopnea  ;  GI:  gallstones  , vomiting  ;  dysphagia  ; melena  ;  hematochezia   ; heartburn  ;   Hx of  Colonoscopy  ; GU: kidney stones   ; hematuria  ;   dysuria   ;  nocturia  ;  history of     obstruction   ; urinary frequency                Skin: rash, swelling  ;, hair loss  ;  peripheral edema  ;  or itching  ; Musculosketetal: myalgias  ;  joint swelling  ;  joint erythema  ;  joint pain  ;  back pain  ;  Heme/Lymph: bruising  ;  bleeding  ;  anemia  ;  Neuro: TIA n ;  headaches  ;  stroke n ;  vertigo  ;  seizures  ;   paresthesias  ;  difficulty walking n ;  Psych:depression  ; anxiety  ;  Endocrine: diabetes  ;  thyroid dysfunction  ;  Immunizations: Flu up to date   ; Pneumococcal up to date   ;  Other: Patient denies any current symptoms of her rectal cancer  Physical Exam: BP 112/70 (BP Location: Left Arm, Patient Position: Sitting, Cuff Size: Normal)   Resp 16   Ht _0  (1.651 m)   Wt 144 lb (65.3 kg)   LMP 04/07/2008   SpO2 99% Comment: RA  BMI 23.96 kg/m   PHYSICAL EXAMINATION: General appearance: alert, cooperative and appears stated age Head: Normocephalic, without obvious abnormality, atraumatic Neck: no adenopathy, no carotid bruit, no JVD, supple, symmetrical, trachea midline and thyroid not enlarged, symmetric, no tenderness/mass/nodules Lymph nodes: Cervical, supraclavicular, and axillary nodes normal. Resp: clear to auscultation bilaterally Back: symmetric,  no curvature. ROM normal. No CVA tenderness. Cardio: regular rate and rhythm, S1, S2 normal, no murmur, click, rub or gallop GI: soft, non-tender; bowel sounds normal; no masses,  no organomegaly Extremities: extremities normal, atraumatic, no cyanosis or edema and Homans sign is negative, no sign of DVT Neurologic: Grossly normal  Diagnostic Studies & Laboratory data:     Recent Radiology Findings:   Ct Chest W Contrast  Result Date: 06/13/2016 CLINICAL DATA:  Right lower lobe pulmonary nodule. Anal cancer diagnosed 4/14. Radiation therapy. Breast cancer in 2010 with chemotherapy and radiation therapy after lumpectomy. EXAM: CT CHEST WITH CONTRAST TECHNIQUE: Multidetector CT imaging of the chest was performed during intravenous contrast administration. CONTRAST:  77m ISOVUE-300 IOPAMIDOL (ISOVUE-300) INJECTION 61% COMPARISON:  10/24/2013 FINDINGS: Cardiovascular: Aortic and branch vessel atherosclerosis. Normal heart size, without pericardial effusion. Multivessel coronary artery atherosclerosis. No central pulmonary embolism, on this non-dedicated study. Mediastinum/Nodes: No  supraclavicular adenopathy. No mediastinal adenopathy. Borderline right hilar adenopathy at 11 mm on image 67/series 2 is felt to be similar. Lungs/Pleura: No pleural fluid. Somewhat ill-defined, possibly sub solid pulmonary nodule measures 9 mm in the right upper lobe posteriorly. Significantly enlarged since the prior. Example image 39/ series 3. An irregular right lower lobe nodular density measures on the order of 1.8 x 1.4 cm on image 87/series 2. Appears more well-defined today than on the prior exam, where it measured 1.4 cm. This difference could be secondary to thinner slice collimation today. 3 mm posterior right upper lobe subpleural pulmonary nodule on image 64/ series 3 is unchanged and can be considered benign. Right upper lobe 6 mm ground-glass nodule is felt to be unchanged, including on image 43/series 3. A 4 mm  ill-defined posterior right upper lobe nodule on image 45/ series 3 is also felt to be similar, given differences in slice thickness. Sub solid 5 mm left upper lobe pulmonary nodule on image 29/series 3 is unchanged. Vague left upper lobe 5 mm ground-glass nodule on image 73/series 3, likely new. Left lower lobe 10 mm ground-glass nodule on image 92/series 3 is more apparent today than on the prior, where it measured on the order of 5 mm. A posterior left upper lobe relatively well-circumscribed solid nodule measures 11 x 10 mm on image 54/ series 3 and is new. Upper Abdomen: Tiny well-circumscribed low-density liver lesions are likely cysts and were present previously. Normal imaged portions of the spleen, stomach, pancreas, gallbladder, biliary tract, adrenal glands, kidneys. Abdominal aortic atherosclerosis. Musculoskeletal: No axillary or subpectoral adenopathy. No acute osseous abnormality. IMPRESSION: 1. A posterior left upper lobe solid 11 mm nodule is suspicious for metastatic disease versus less likely attack metachronous primary bronchogenic carcinoma. Consider tissue sampling versus further evaluation with PET. 2. Other solid and sub solid pulmonary nodules are primarily similar. 10 mm left lower lobe and 9 mm right upper lobe sub solid nodules have enlarged. Apparent increase in size of a solid right lower lobe nodule, favored to be due to thinner slice collimation today. 3. No thoracic adenopathy. 4. Age advanced coronary artery atherosclerosis. Recommend assessment of coronary risk factors and consideration of medical therapy. Electronically Signed   By: Abigail Miyamoto M.D.   On: 06/13/2016 14:36   Nm Pet Image Initial (pi) Skull Base To Thigh  Result Date: 06/22/2016 CLINICAL DATA:  Subsequent treatment strategy for breast and anal cancer. New left lung nodule. EXAM: NUCLEAR MEDICINE PET SKULL BASE TO THIGH TECHNIQUE: 7.7 mCi F-18 FDG was injected intravenously. Full-ring PET imaging was performed  from the skull base to thigh after the radiotracer. CT data was obtained and used for attenuation correction and anatomic localization. FASTING BLOOD GLUCOSE:  Value: 87 mg/dl COMPARISON:  08/20/2012 FINDINGS: NECK No hypermetabolic lymph nodes in the neck. CHEST Aortic atherosclerosis. Calcification involving the LAD coronary artery. No hypermetabolic mediastinal or hilar nodes. Left upper lobe lung nodule measures 1 cm. This exhibits malignant range FDG uptake within SUV max equal to 7.3, image 25 of series 8. No additional hypermetabolic pulmonary nodules identified. This includes the irregular-shaped 1.4 x 1.8 cm nodular density in the right lower lobe which has an SUV max equal to 1.35, image 41 of series 8. ABDOMEN/PELVIS No abnormal hypermetabolic activity within the liver, pancreas, adrenal glands, or spleen. Left renal calculus. Aortic atherosclerosis. No hypermetabolic lymph nodes in the abdomen or pelvis. SKELETON No focal hypermetabolic activity to suggest skeletal metastasis. IMPRESSION: 1. Malignant range FDG  uptake is associated with the solid 11 mm nodule within the left upper lobe which may represent metastatic disease or attack hernias primary bronchogenic carcinoma. Correlation with tissue sampling is advised. 2. No additional hypermetabolic pulmonary nodules are identified at this time including the irregularly-shaped nodule within the right lower lobe. 3. Kidney stone. 4. Aortic atherosclerosis and coronary artery calcification. Electronically Signed   By: Kerby Moors M.D.   On: 06/22/2016 09:27     I have independently reviewed the above radiologic studies.  Recent Lab Findings: Lab Results  Component Value Date   WBC 5.8 04/07/2016   HGB 14.7 04/07/2016   HCT 44.0 04/07/2016   PLT 254 04/07/2016   GLUCOSE 129 (H) 04/07/2016   CHOL 224 (H) 04/07/2016   TRIG 66 04/07/2016   HDL 81 04/07/2016   LDLCALC 130 (H) 04/07/2016   ALT 10 04/07/2016   AST 12 04/07/2016   NA 141  04/07/2016   K 4.1 04/07/2016   CL 102 04/07/2016   CREATININE 0.71 04/07/2016   BUN 14 04/07/2016   CO2 31 04/07/2016   TSH 3.173 12/25/2014   INR 1.01 08/02/2012   PFT"s FEV1 1.48 52%  Increased 1.88 66% 26% increase with bronch dilator  DLCO 25.6  69%  The FVC, FEV1, FEV1/FVC ratio and FEF25-75% are reduced indicating airway obstruction. The SVC is reduced, but the TLC is within normal limits. Following administration of bronchodilators, there is a significant response indicated by the increased FVC. The reduced diffusing capacity indicates a mild loss of functional alveolar capillary surface. However, the diffusing capacity was not corrected for the patient's hemoglobin. Pulmonary Function Diagnosis: Moderately severe Obstructive Airways Disease with reversibility Mild Diffusion Defect   Assessment / Plan:   The patient has  new left upper lobe lung lesion markedly hypermetabolic that has not been present on previous CT scans, and highly suspicious for malignancy. It's unclear if this is an isolated pulmonary metastasis from breast cancer,  anal cancer or a possible primary lung cancer. It does appear radiographically different and has behaved biologically different than the sub-solid lesion in the right lung. The patient was presented at the multidisciplinary thoracic oncology conference and the GI conference.  After discussion with patient and Dr. Benay Spice plan to proceed with bronchoscopy left video-assisted thoracoscopy wedge resection of the left upper lobe lung nodule possible node dissection. Wrist and options were discussed with the patient and husband in detail. Will plan to proceed on Wednesday, February 28.  The patient had somewhat limited pulmonary function studies but did have a very significant increase in her FEV1 with bronchodilators. I have given her prescription of albuterol to use for the next several days prior to surgery. She has stopped smoking since she was seen  in the office last week.  [name] MD      [address] [place] [address] Office [phone]   Beeper [phone]  07/03/2016 3:11 PM

## 2016-07-03 NOTE — Pre-Procedure Instructions (Addendum)
    Zeniah Briney Albea  07/03/2016      [id number] [address] [place] [address]Phone: [phone]: [phone]   Your procedure is scheduled on Wednesday, July 05, 2016   [facility].             (posted surgery time 7:30 - 9:59 am)   Call this number if you have problems the [facility]:  [phone] or Mon - Fri from 8 - 4:30 pm [phone]   Remember:  Do not eat food or drink liquids after midnight Tuesday, July 04, 2016   Take these medicines the morning of surgery with A SIP OF WATER :  Lorazepam (Ativan),  anastrozole (ARIMIDEX), if needed: loratadine (CLARITIN) for allergies,  albuterol (PROVENTIL HFA;VENTOLIN HFA) inhaler for wheezing or shortness of breath  ( Bring inhaler in with you on day of surgery).  Stop taking Aspirin, vitamins, fish oil and herbal medications. Do not take any NSAIDs ie: Ibuprofen, Advil, Naproxen ( Anaprox) BC and Goody Powder or any medication containing Aspirin; stop now.   Do not wear jewelry, make-up or nail polish.  Do not wear lotions, powders, perfumes, or deoderant.  Do not shave underarms & legs 48 hours prior to surgery.     Do not bring valuables to the hospital.  [facility] any belongings or [facility]ables. Contacts, dentures or bridgework may not be worn into surgery.  Leave your suitcase in the car.  After surgery it may be brought to your room. For patients admitted to the hospital, discharge time will be determined by your treatment team. Please read over the following fact sheets that you were given. Pain Booklet, Coughing and Deep Breathing, MRSA Information and Surgical Site Infection Prevention

## 2016-07-04 ENCOUNTER — Encounter (HOSPITAL_COMMUNITY): Payer: Self-pay

## 2016-07-04 ENCOUNTER — Encounter (HOSPITAL_COMMUNITY)
Admission: RE | Admit: 2016-07-04 | Discharge: 2016-07-04 | Disposition: A | Discharge status: Home / Self Care | Payer: BLUE CROSS/BLUE SHIELD | Source: Ambulatory Visit | Attending: Cardiothoracic Surgery | Admitting: Cardiothoracic Surgery

## 2016-07-04 ENCOUNTER — Ambulatory Visit (HOSPITAL_COMMUNITY)
Admission: RE | Admit: 2016-07-04 | Discharge: 2016-07-04 | Disposition: A | Discharge status: Home / Self Care | Payer: BLUE CROSS/BLUE SHIELD | Source: Ambulatory Visit | Attending: Cardiothoracic Surgery | Admitting: Cardiothoracic Surgery

## 2016-07-04 DIAGNOSIS — Z4682 Encounter for fitting and adjustment of non-vascular catheter: Secondary | ICD-10-CM | POA: Diagnosis not present

## 2016-07-04 DIAGNOSIS — R911 Solitary pulmonary nodule: Secondary | ICD-10-CM | POA: Diagnosis not present

## 2016-07-04 DIAGNOSIS — J449 Chronic obstructive pulmonary disease, unspecified: Secondary | ICD-10-CM

## 2016-07-04 DIAGNOSIS — C3411 Malignant neoplasm of upper lobe, right bronchus or lung: Secondary | ICD-10-CM | POA: Diagnosis not present

## 2016-07-04 DIAGNOSIS — M1990 Unspecified osteoarthritis, unspecified site: Secondary | ICD-10-CM | POA: Diagnosis not present

## 2016-07-04 DIAGNOSIS — C3412 Malignant neoplasm of upper lobe, left bronchus or lung: Secondary | ICD-10-CM | POA: Diagnosis not present

## 2016-07-04 DIAGNOSIS — C210 Malignant neoplasm of anus, unspecified: Secondary | ICD-10-CM | POA: Diagnosis not present

## 2016-07-04 DIAGNOSIS — F17210 Nicotine dependence, cigarettes, uncomplicated: Secondary | ICD-10-CM | POA: Diagnosis not present

## 2016-07-04 DIAGNOSIS — F410 Panic disorder [episodic paroxysmal anxiety] without agoraphobia: Secondary | ICD-10-CM | POA: Diagnosis not present

## 2016-07-04 DIAGNOSIS — Z853 Personal history of malignant neoplasm of breast: Secondary | ICD-10-CM | POA: Diagnosis not present

## 2016-07-04 DIAGNOSIS — Z01818 Encounter for other preprocedural examination: Secondary | ICD-10-CM | POA: Insufficient documentation

## 2016-07-04 DIAGNOSIS — Z85048 Personal history of other malignant neoplasm of rectum, rectosigmoid junction, and anus: Secondary | ICD-10-CM | POA: Diagnosis not present

## 2016-07-04 DIAGNOSIS — C50911 Malignant neoplasm of unspecified site of right female breast: Secondary | ICD-10-CM | POA: Diagnosis not present

## 2016-07-04 DIAGNOSIS — Z9889 Other specified postprocedural states: Secondary | ICD-10-CM | POA: Diagnosis not present

## 2016-07-04 DIAGNOSIS — Z9221 Personal history of antineoplastic chemotherapy: Secondary | ICD-10-CM | POA: Diagnosis not present

## 2016-07-04 DIAGNOSIS — Z923 Personal history of irradiation: Secondary | ICD-10-CM | POA: Diagnosis not present

## 2016-07-04 DIAGNOSIS — J939 Pneumothorax, unspecified: Secondary | ICD-10-CM | POA: Diagnosis not present

## 2016-07-04 DIAGNOSIS — D62 Acute posthemorrhagic anemia: Secondary | ICD-10-CM | POA: Diagnosis not present

## 2016-07-04 HISTORY — DX: Solitary pulmonary nodule: R91.1

## 2016-07-04 HISTORY — DX: Unspecified osteoarthritis, unspecified site: M19.90

## 2016-07-04 HISTORY — DX: Nausea with vomiting, unspecified: R11.2

## 2016-07-04 HISTORY — DX: Other specified postprocedural states: R11.2

## 2016-07-04 HISTORY — DX: Palpitations: R00.2

## 2016-07-04 HISTORY — DX: Other specified postprocedural states: Z98.890

## 2016-07-04 LAB — COMPREHENSIVE METABOLIC PANEL
ALT: 15 U/L (ref 14–54)
AST: 18 U/L (ref 15–41)
Albumin: 4.1 g/dL (ref 3.5–5.0)
Alkaline Phosphatase: 61 U/L (ref 38–126)
Anion gap: 9 (ref 5–15)
BUN: 11 mg/dL (ref 6–20)
CO2: 31 mmol/L (ref 22–32)
Calcium: 9.4 mg/dL (ref 8.9–10.3)
Chloride: 98 mmol/L — ABNORMAL LOW (ref 101–111)
Creatinine, Ser: 0.64 mg/dL (ref 0.44–1.00)
GFR calc Af Amer: >60 mL/min (ref >60)
GFR calc non Af Amer: >60 mL/min (ref >60)
Glucose, Bld: 103 mg/dL — ABNORMAL HIGH (ref 65–99)
Potassium: 4.6 mmol/L (ref 3.5–5.1)
Sodium: 138 mmol/L (ref 135–145)
Total Bilirubin: 0.9 mg/dL (ref 0.3–1.2)
Total Protein: 7.6 g/dL (ref 6.5–8.1)

## 2016-07-04 LAB — URINALYSIS, ROUTINE W REFLEX MICROSCOPIC
Bacteria, UA: NONE SEEN
Bilirubin Urine: NEGATIVE
Glucose, UA: NEGATIVE mg/dL
Hgb urine dipstick: NEGATIVE
Ketones, ur: NEGATIVE mg/dL
Leukocytes, UA: NEGATIVE
Nitrite: NEGATIVE
Protein, ur: 100 mg/dL — AB
Specific Gravity, Urine: 1.017 (ref 1.005–1.030)
pH: 7 (ref 5.0–8.0)

## 2016-07-04 LAB — SURGICAL PCR SCREEN
MRSA, PCR: NEGATIVE
Staphylococcus aureus: NEGATIVE

## 2016-07-04 LAB — CBC
HCT: 44.7 % (ref 36.0–46.0)
Hemoglobin: 15.2 g/dL — ABNORMAL HIGH (ref 12.0–15.0)
MCH: 34.4 pg — ABNORMAL HIGH (ref 26.0–34.0)
MCHC: 34 g/dL (ref 30.0–36.0)
MCV: 101.1 fL — ABNORMAL HIGH (ref 78.0–100.0)
Platelets: 255 10*3/uL (ref 150–400)
RBC: 4.42 MIL/uL (ref 3.87–5.11)
RDW: 13 % (ref 11.5–15.5)
WBC: 5.8 10*3/uL (ref 4.0–10.5)

## 2016-07-04 LAB — ABO/RH: ABO/RH(D): B POS

## 2016-07-04 LAB — PROTIME-INR
INR: 1
Prothrombin Time: 13.2 seconds (ref 11.4–15.2)

## 2016-07-04 LAB — APTT: aPTT: 27 seconds (ref 24–36)

## 2016-07-04 MED ORDER — DEXTROSE 5 % IV SOLN
1.5000 g | INTRAVENOUS | Status: AC
  Administered 2016-07-05: 1.5 g via INTRAVENOUS
  Filled 2016-07-04: qty 1.5

## 2016-07-04 NOTE — Progress Notes (Signed)
Pt denies having any acute cardiopulmonary issues. Pt denies being under the care of a cardiologist. Pt denies having a stress test and cardiac cath. Pt denies having an EKG and chest x ray within the last year. Repeat ABG's DOS.

## 2016-07-05 ENCOUNTER — Inpatient Hospital Stay (HOSPITAL_COMMUNITY): Payer: BLUE CROSS/BLUE SHIELD | Admitting: Certified Registered"

## 2016-07-05 ENCOUNTER — Inpatient Hospital Stay (HOSPITAL_COMMUNITY)
Admission: RE | Admit: 2016-07-05 | Discharge: 2016-07-08 | DRG: 164 | Disposition: A | Discharge status: Home / Self Care | Payer: BLUE CROSS/BLUE SHIELD | Source: Ambulatory Visit | Attending: Cardiothoracic Surgery | Admitting: Cardiothoracic Surgery

## 2016-07-05 ENCOUNTER — Encounter (HOSPITAL_COMMUNITY): Payer: Self-pay | Admitting: *Deleted

## 2016-07-05 ENCOUNTER — Encounter (HOSPITAL_COMMUNITY)
Admission: RE | Disposition: A | Discharge status: Home / Self Care | Payer: Self-pay | Source: Ambulatory Visit | Attending: Cardiothoracic Surgery

## 2016-07-05 ENCOUNTER — Inpatient Hospital Stay (HOSPITAL_COMMUNITY): Payer: BLUE CROSS/BLUE SHIELD

## 2016-07-05 DIAGNOSIS — M1990 Unspecified osteoarthritis, unspecified site: Secondary | ICD-10-CM | POA: Diagnosis present

## 2016-07-05 DIAGNOSIS — Z9221 Personal history of antineoplastic chemotherapy: Secondary | ICD-10-CM

## 2016-07-05 DIAGNOSIS — J939 Pneumothorax, unspecified: Secondary | ICD-10-CM | POA: Diagnosis not present

## 2016-07-05 DIAGNOSIS — C3411 Malignant neoplasm of upper lobe, right bronchus or lung: Secondary | ICD-10-CM | POA: Diagnosis not present

## 2016-07-05 DIAGNOSIS — R911 Solitary pulmonary nodule: Secondary | ICD-10-CM

## 2016-07-05 DIAGNOSIS — C3412 Malignant neoplasm of upper lobe, left bronchus or lung: Secondary | ICD-10-CM | POA: Diagnosis not present

## 2016-07-05 DIAGNOSIS — Z4682 Encounter for fitting and adjustment of non-vascular catheter: Secondary | ICD-10-CM | POA: Diagnosis not present

## 2016-07-05 DIAGNOSIS — J9383 Other pneumothorax: Secondary | ICD-10-CM

## 2016-07-05 DIAGNOSIS — Z853 Personal history of malignant neoplasm of breast: Secondary | ICD-10-CM | POA: Diagnosis not present

## 2016-07-05 DIAGNOSIS — Z923 Personal history of irradiation: Secondary | ICD-10-CM

## 2016-07-05 DIAGNOSIS — F17210 Nicotine dependence, cigarettes, uncomplicated: Secondary | ICD-10-CM | POA: Diagnosis present

## 2016-07-05 DIAGNOSIS — C50911 Malignant neoplasm of unspecified site of right female breast: Secondary | ICD-10-CM | POA: Diagnosis not present

## 2016-07-05 DIAGNOSIS — F410 Panic disorder [episodic paroxysmal anxiety] without agoraphobia: Secondary | ICD-10-CM | POA: Diagnosis present

## 2016-07-05 DIAGNOSIS — C210 Malignant neoplasm of anus, unspecified: Secondary | ICD-10-CM | POA: Diagnosis not present

## 2016-07-05 DIAGNOSIS — Z85048 Personal history of other malignant neoplasm of rectum, rectosigmoid junction, and anus: Secondary | ICD-10-CM | POA: Diagnosis not present

## 2016-07-05 DIAGNOSIS — D62 Acute posthemorrhagic anemia: Secondary | ICD-10-CM | POA: Diagnosis not present

## 2016-07-05 DIAGNOSIS — C3410 Malignant neoplasm of upper lobe, unspecified bronchus or lung: Secondary | ICD-10-CM | POA: Diagnosis present

## 2016-07-05 DIAGNOSIS — Z9889 Other specified postprocedural states: Secondary | ICD-10-CM | POA: Diagnosis not present

## 2016-07-05 HISTORY — PX: VIDEO BRONCHOSCOPY: SHX5072

## 2016-07-05 HISTORY — PX: VIDEO ASSISTED THORACOSCOPY (VATS)/WEDGE RESECTION: SHX6174

## 2016-07-05 LAB — BLOOD GAS, ARTERIAL
Acid-Base Excess: 3.9 mmol/L — ABNORMAL HIGH (ref 0.0–2.0)
Bicarbonate: 29 mmol/L — ABNORMAL HIGH (ref 20.0–28.0)
O2 Content: 2 L/min
O2 Saturation: 90 %
Patient temperature: 98.6
pCO2 arterial: 52.9 mmHg — ABNORMAL HIGH (ref 32.0–48.0)
pH, Arterial: 7.359 (ref 7.350–7.450)
pO2, Arterial: 65.5 mmHg — ABNORMAL LOW (ref 83.0–108.0)

## 2016-07-05 LAB — GLUCOSE, CAPILLARY
Glucose-Capillary: 164 mg/dL — ABNORMAL HIGH (ref 65–99)
Glucose-Capillary: 192 mg/dL — ABNORMAL HIGH (ref 65–99)

## 2016-07-05 LAB — PREPARE RBC (CROSSMATCH)

## 2016-07-05 SURGERY — BRONCHOSCOPY, VIDEO-ASSISTED
Anesthesia: General | Site: Chest

## 2016-07-05 MED ORDER — OXYCODONE HCL 5 MG PO TABS: 5.0000 mg | ORAL_TABLET | ORAL | Status: DC | PRN

## 2016-07-05 MED ORDER — ORAL CARE MOUTH RINSE
15.0000 mL | Freq: Two times a day (BID) | OROMUCOSAL | Status: DC
  Administered 2016-07-05: 15 mL via OROMUCOSAL

## 2016-07-05 MED ORDER — MIDAZOLAM HCL 2 MG/2ML IJ SOLN
INTRAMUSCULAR | Status: DC | PRN
  Administered 2016-07-05 (×4): 1 mg via INTRAVENOUS

## 2016-07-05 MED ORDER — BUPIVACAINE 0.5 % ON-Q PUMP SINGLE CATH 400 ML
INJECTION | Status: DC | PRN
  Administered 2016-07-05: 400 mL

## 2016-07-05 MED ORDER — DEXAMETHASONE SODIUM PHOSPHATE 10 MG/ML IJ SOLN
INTRAMUSCULAR | Status: AC
  Filled 2016-07-05: qty 1

## 2016-07-05 MED ORDER — LIDOCAINE 2% (20 MG/ML) 5 ML SYRINGE
INTRAMUSCULAR | Status: DC | PRN
  Administered 2016-07-05: 100 mg via INTRAVENOUS

## 2016-07-05 MED ORDER — DIPHENHYDRAMINE HCL 50 MG/ML IJ SOLN: 12.5000 mg | Freq: Four times a day (QID) | INTRAMUSCULAR | Status: DC | PRN

## 2016-07-05 MED ORDER — PROPOFOL 10 MG/ML IV BOLUS
INTRAVENOUS | Status: AC
  Filled 2016-07-05: qty 40

## 2016-07-05 MED ORDER — ONDANSETRON HCL 4 MG/2ML IJ SOLN: 4.0000 mg | Freq: Four times a day (QID) | INTRAMUSCULAR | Status: DC | PRN

## 2016-07-05 MED ORDER — SUGAMMADEX SODIUM 200 MG/2ML IV SOLN
INTRAVENOUS | Status: DC | PRN
  Administered 2016-07-05 (×2): 200 mg via INTRAVENOUS

## 2016-07-05 MED ORDER — BUPIVACAINE 0.5 % ON-Q PUMP SINGLE CATH 400 ML
400.0000 mL | INJECTION | Status: AC
  Filled 2016-07-05: qty 400

## 2016-07-05 MED ORDER — BUPIVACAINE HCL 0.5 % IJ SOLN
INTRAMUSCULAR | Status: DC | PRN
  Administered 2016-07-05: 10 mL

## 2016-07-05 MED ORDER — SUGAMMADEX SODIUM 200 MG/2ML IV SOLN
INTRAVENOUS | Status: AC
  Filled 2016-07-05: qty 4

## 2016-07-05 MED ORDER — HYDROMORPHONE HCL 1 MG/ML IJ SOLN
INTRAMUSCULAR | Status: AC
  Filled 2016-07-05: qty 1

## 2016-07-05 MED ORDER — TRAMADOL HCL 50 MG PO TABS
50.0000 mg | ORAL_TABLET | Freq: Four times a day (QID) | ORAL | Status: DC | PRN
  Administered 2016-07-06 – 2016-07-08 (×6): 100 mg via ORAL
  Filled 2016-07-05 (×6): qty 2

## 2016-07-05 MED ORDER — LACTATED RINGERS IV SOLN
INTRAVENOUS | Status: DC | PRN
Start: 2016-07-05 — End: 2016-07-05
  Administered 2016-07-05: 07:00:00 via INTRAVENOUS

## 2016-07-05 MED ORDER — DEXTROSE-NACL 5-0.9 % IV SOLN
INTRAVENOUS | Status: DC
  Administered 2016-07-05 – 2016-07-07 (×2): via INTRAVENOUS

## 2016-07-05 MED ORDER — SENNOSIDES-DOCUSATE SODIUM 8.6-50 MG PO TABS
1.0000 | ORAL_TABLET | Freq: Every day | ORAL | Status: DC
  Administered 2016-07-05 – 2016-07-07 (×2): 1 via ORAL
  Filled 2016-07-05 (×2): qty 1

## 2016-07-05 MED ORDER — SODIUM CHLORIDE 0.9% FLUSH
9.0000 mL | INTRAVENOUS | Status: DC | PRN
Start: 2016-07-05 — End: 2016-07-07

## 2016-07-05 MED ORDER — HYDROMORPHONE HCL 1 MG/ML IJ SOLN: 0.2500 mg | INTRAMUSCULAR | Status: DC | PRN

## 2016-07-05 MED ORDER — SODIUM CHLORIDE 0.9 % IV SOLN: Freq: Once | INTRAVENOUS | Status: DC

## 2016-07-05 MED ORDER — LEVALBUTEROL HCL 0.63 MG/3ML IN NEBU
0.6300 mg | INHALATION_SOLUTION | Freq: Three times a day (TID) | RESPIRATORY_TRACT | Status: DC
  Administered 2016-07-05: 0.63 mg via RESPIRATORY_TRACT
  Filled 2016-07-05: qty 3

## 2016-07-05 MED ORDER — LACTATED RINGERS IV SOLN
INTRAVENOUS | Status: DC | PRN
  Administered 2016-07-05: 08:00:00 via INTRAVENOUS

## 2016-07-05 MED ORDER — LORATADINE 10 MG PO TABS: 10.0000 mg | ORAL_TABLET | Freq: Every day | ORAL | Status: DC | PRN

## 2016-07-05 MED ORDER — FENTANYL CITRATE (PF) 250 MCG/5ML IJ SOLN
INTRAMUSCULAR | Status: AC
  Filled 2016-07-05: qty 5

## 2016-07-05 MED ORDER — ROCURONIUM BROMIDE 50 MG/5ML IV SOSY
PREFILLED_SYRINGE | INTRAVENOUS | Status: AC
  Filled 2016-07-05: qty 15

## 2016-07-05 MED ORDER — ACETAMINOPHEN 500 MG PO TABS
1000.0000 mg | ORAL_TABLET | Freq: Four times a day (QID) | ORAL | Status: DC
  Administered 2016-07-05: 1000 mg via ORAL
  Filled 2016-07-05: qty 2

## 2016-07-05 MED ORDER — ONDANSETRON HCL 4 MG/2ML IJ SOLN
INTRAMUSCULAR | Status: AC
  Filled 2016-07-05: qty 2

## 2016-07-05 MED ORDER — ROCURONIUM BROMIDE 50 MG/5ML IV SOSY
PREFILLED_SYRINGE | INTRAVENOUS | Status: AC
  Filled 2016-07-05: qty 5

## 2016-07-05 MED ORDER — DIPHENHYDRAMINE HCL 12.5 MG/5ML PO ELIX: 12.5000 mg | ORAL_SOLUTION | Freq: Four times a day (QID) | ORAL | Status: DC | PRN

## 2016-07-05 MED ORDER — ACETAMINOPHEN 500 MG PO TABS
1000.0000 mg | ORAL_TABLET | Freq: Four times a day (QID) | ORAL | Status: DC
  Administered 2016-07-06 – 2016-07-08 (×8): 1000 mg via ORAL
  Filled 2016-07-05 (×9): qty 2

## 2016-07-05 MED ORDER — ANASTROZOLE 1 MG PO TABS
1.0000 mg | ORAL_TABLET | Freq: Every day | ORAL | Status: DC
  Administered 2016-07-06 – 2016-07-08 (×3): 1 mg via ORAL
  Filled 2016-07-05 (×3): qty 1

## 2016-07-05 MED ORDER — NALOXONE HCL 0.4 MG/ML IJ SOLN: 0.4000 mg | INTRAMUSCULAR | Status: DC | PRN

## 2016-07-05 MED ORDER — LIDOCAINE 2% (20 MG/ML) 5 ML SYRINGE
INTRAMUSCULAR | Status: AC
  Filled 2016-07-05: qty 5

## 2016-07-05 MED ORDER — ONDANSETRON HCL 4 MG/2ML IJ SOLN
INTRAMUSCULAR | Status: DC | PRN
  Administered 2016-07-05: 4 mg via INTRAVENOUS

## 2016-07-05 MED ORDER — ACETAMINOPHEN 160 MG/5ML PO SOLN: 1000.0000 mg | Freq: Four times a day (QID) | ORAL | Status: DC

## 2016-07-05 MED ORDER — LEVALBUTEROL HCL 0.63 MG/3ML IN NEBU
0.6300 mg | INHALATION_SOLUTION | Freq: Four times a day (QID) | RESPIRATORY_TRACT | Status: DC | PRN
Start: 2016-07-05 — End: 2016-07-08

## 2016-07-05 MED ORDER — HEMOSTATIC AGENTS (NO CHARGE) OPTIME
TOPICAL | Status: DC | PRN
  Administered 2016-07-05: 1 via TOPICAL

## 2016-07-05 MED ORDER — LACTATED RINGERS IV SOLN
INTRAVENOUS | Status: DC | PRN
  Administered 2016-07-05: 07:00:00 via INTRAVENOUS

## 2016-07-05 MED ORDER — LEVALBUTEROL HCL 0.63 MG/3ML IN NEBU
0.6300 mg | INHALATION_SOLUTION | Freq: Three times a day (TID) | RESPIRATORY_TRACT | Status: DC
  Administered 2016-07-05 – 2016-07-08 (×8): 0.63 mg via RESPIRATORY_TRACT
  Filled 2016-07-05 (×8): qty 3

## 2016-07-05 MED ORDER — ROCURONIUM BROMIDE 10 MG/ML (PF) SYRINGE
PREFILLED_SYRINGE | INTRAVENOUS | Status: DC | PRN
  Administered 2016-07-05 (×2): 30 mg via INTRAVENOUS
  Administered 2016-07-05: 50 mg via INTRAVENOUS
  Administered 2016-07-05: 20 mg via INTRAVENOUS
  Administered 2016-07-05: 50 mg via INTRAVENOUS

## 2016-07-05 MED ORDER — INSULIN ASPART 100 UNIT/ML ~~LOC~~ SOLN
0.0000 [IU] | Freq: Four times a day (QID) | SUBCUTANEOUS | Status: DC
  Administered 2016-07-05: 4 [IU] via SUBCUTANEOUS
  Administered 2016-07-06: 2 [IU] via SUBCUTANEOUS
  Administered 2016-07-06: 4 [IU] via SUBCUTANEOUS

## 2016-07-05 MED ORDER — FENTANYL 40 MCG/ML IV SOLN
INTRAVENOUS | Status: DC
  Administered 2016-07-05: 10 ug via INTRAVENOUS
  Administered 2016-07-05: 80 ug via INTRAVENOUS
  Administered 2016-07-05: 1000 ug via INTRAVENOUS
  Administered 2016-07-06: 40 ug via INTRAVENOUS
  Administered 2016-07-06: 20 ug via INTRAVENOUS
  Administered 2016-07-06: 6 mL via INTRAVENOUS
  Administered 2016-07-06: 40 ug via INTRAVENOUS
  Administered 2016-07-06: 70 ug via INTRAVENOUS
  Filled 2016-07-05: qty 25

## 2016-07-05 MED ORDER — PROPOFOL 10 MG/ML IV BOLUS
INTRAVENOUS | Status: DC | PRN
  Administered 2016-07-05: 50 mg via INTRAVENOUS
  Administered 2016-07-05: 20 mg via INTRAVENOUS
  Administered 2016-07-05: 50 mg via INTRAVENOUS
  Administered 2016-07-05: 150 mg via INTRAVENOUS

## 2016-07-05 MED ORDER — ALBUTEROL SULFATE HFA 108 (90 BASE) MCG/ACT IN AERS
INHALATION_SPRAY | RESPIRATORY_TRACT | Status: DC | PRN
  Administered 2016-07-05: 6 via RESPIRATORY_TRACT

## 2016-07-05 MED ORDER — HYDROMORPHONE HCL 1 MG/ML IJ SOLN
INTRAMUSCULAR | Status: DC | PRN
  Administered 2016-07-05 (×2): 1 mg via INTRAVENOUS

## 2016-07-05 MED ORDER — DEXAMETHASONE SODIUM PHOSPHATE 10 MG/ML IJ SOLN
INTRAMUSCULAR | Status: DC | PRN
  Administered 2016-07-05: 10 mg via INTRAVENOUS

## 2016-07-05 MED ORDER — FENTANYL CITRATE (PF) 100 MCG/2ML IJ SOLN
INTRAMUSCULAR | Status: DC | PRN
Start: 2016-07-05 — End: 2016-07-05
  Administered 2016-07-05 (×3): 50 ug via INTRAVENOUS
  Administered 2016-07-05: 75 ug via INTRAVENOUS
  Administered 2016-07-05: 50 ug via INTRAVENOUS
  Administered 2016-07-05: 100 ug via INTRAVENOUS
  Administered 2016-07-05 (×2): 50 ug via INTRAVENOUS
  Administered 2016-07-05: 25 ug via INTRAVENOUS

## 2016-07-05 MED ORDER — BISACODYL 5 MG PO TBEC
10.0000 mg | DELAYED_RELEASE_TABLET | Freq: Every day | ORAL | Status: DC
  Filled 2016-07-05 (×3): qty 2

## 2016-07-05 MED ORDER — BUPIVACAINE HCL (PF) 0.5 % IJ SOLN
INTRAMUSCULAR | Status: AC
  Filled 2016-07-05: qty 10

## 2016-07-05 MED ORDER — MIDAZOLAM HCL 2 MG/2ML IJ SOLN
INTRAMUSCULAR | Status: AC
  Filled 2016-07-05: qty 4

## 2016-07-05 MED ORDER — SODIUM CHLORIDE 0.9 % IV SOLN
30.0000 meq | Freq: Every day | INTRAVENOUS | Status: DC | PRN
  Filled 2016-07-05: qty 15

## 2016-07-05 MED ORDER — ADULT MULTIVITAMIN W/MINERALS CH
1.0000 | ORAL_TABLET | Freq: Every day | ORAL | Status: DC
Start: 2016-07-06 — End: 2016-07-08
  Administered 2016-07-06 – 2016-07-08 (×3): 1 via ORAL
  Filled 2016-07-05 (×3): qty 1

## 2016-07-05 MED ORDER — 0.9 % SODIUM CHLORIDE (POUR BTL) OPTIME
TOPICAL | Status: DC | PRN
  Administered 2016-07-05: 1000 mL

## 2016-07-05 MED ORDER — PANTOPRAZOLE SODIUM 40 MG PO TBEC
40.0000 mg | DELAYED_RELEASE_TABLET | Freq: Every day | ORAL | Status: DC
  Administered 2016-07-05 – 2016-07-08 (×4): 40 mg via ORAL
  Filled 2016-07-05 (×4): qty 1

## 2016-07-05 MED ORDER — LORAZEPAM 1 MG PO TABS: 1.0000 mg | ORAL_TABLET | Freq: Three times a day (TID) | ORAL | Status: DC | PRN

## 2016-07-05 MED ORDER — DEXTROSE 5 % IV SOLN
1.5000 g | Freq: Two times a day (BID) | INTRAVENOUS | Status: AC
  Administered 2016-07-05 – 2016-07-06 (×2): 1.5 g via INTRAVENOUS
  Filled 2016-07-05 (×2): qty 1.5

## 2016-07-05 SURGICAL SUPPLY — 91 items
ADH SKN CLS APL DERMABOND .7 (GAUZE/BANDAGES/DRESSINGS) ×2
APL SRG 22X2 LUM MLBL SLNT (VASCULAR PRODUCTS)
APL SRG 7X2 LUM MLBL SLNT (VASCULAR PRODUCTS)
APPLICATOR TIP COSEAL (VASCULAR PRODUCTS) IMPLANT
APPLICATOR TIP EXT COSEAL (VASCULAR PRODUCTS) IMPLANT
BLADE SURG 11 STRL SS (BLADE) IMPLANT
BRUSH CYTOL CELLEBRITY 1.5X140 (MISCELLANEOUS) IMPLANT
CANISTER SUCT 3000ML PPV (MISCELLANEOUS) ×3 IMPLANT
CATH KIT ON Q 5IN SLV (PAIN MANAGEMENT) ×1 IMPLANT
CATH THORACIC 28FR (CATHETERS) ×1 IMPLANT
CATH THORACIC 36FR (CATHETERS) IMPLANT
CATH THORACIC 36FR RT ANG (CATHETERS) IMPLANT
CLIP TI MEDIUM 6 (CLIP) ×3 IMPLANT
CONN ST 1/4X3/8  BEN (MISCELLANEOUS)
CONN ST 1/4X3/8 BEN (MISCELLANEOUS) IMPLANT
CONT SPEC 4OZ CLIKSEAL STRL BL (MISCELLANEOUS) ×9 IMPLANT
COVER BACK TABLE 60X90IN (DRAPES) ×3 IMPLANT
DERMABOND ADVANCED (GAUZE/BANDAGES/DRESSINGS) ×1
DERMABOND ADVANCED .7 DNX12 (GAUZE/BANDAGES/DRESSINGS) IMPLANT
DRAIN CHANNEL 28F RND 3/8 FF (WOUND CARE) ×1 IMPLANT
DRAIN CHANNEL 32F RND 10.7 FF (WOUND CARE) IMPLANT
DRAPE LAPAROSCOPIC ABDOMINAL (DRAPES) ×3 IMPLANT
DRAPE WARM FLUID 44X44 (DRAPE) ×3 IMPLANT
DRILL BIT 7/64X5 (BIT) IMPLANT
DRSG COVADERM 4X6 (GAUZE/BANDAGES/DRESSINGS) ×1 IMPLANT
ELECT BLADE 4.0 EZ CLEAN MEGAD (MISCELLANEOUS) ×3
ELECT REM PT RETURN 9FT ADLT (ELECTROSURGICAL) ×3
ELECTRODE BLDE 4.0 EZ CLN MEGD (MISCELLANEOUS) ×2 IMPLANT
ELECTRODE REM PT RTRN 9FT ADLT (ELECTROSURGICAL) ×2 IMPLANT
FORCEPS BIOP RJ4 1.8 (CUTTING FORCEPS) IMPLANT
GAUZE SPONGE 4X4 12PLY STRL (GAUZE/BANDAGES/DRESSINGS) ×3 IMPLANT
GLOVE BIO SURGEON STRL SZ 6.5 (GLOVE) ×6 IMPLANT
GOWN STRL REUS W/ TWL LRG LVL3 (GOWN DISPOSABLE) ×8 IMPLANT
GOWN STRL REUS W/TWL LRG LVL3 (GOWN DISPOSABLE) ×12
KIT BASIN OR (CUSTOM PROCEDURE TRAY) ×3 IMPLANT
KIT CLEAN ENDO COMPLIANCE (KITS) ×3 IMPLANT
KIT ROOM TURNOVER OR (KITS) ×3 IMPLANT
KIT SUCTION CATH 14FR (SUCTIONS) ×3 IMPLANT
MARKER SKIN DUAL TIP RULER LAB (MISCELLANEOUS) IMPLANT
NDL BIOPSY TRANSBRONCH 21G (NEEDLE) IMPLANT
NEEDLE BIOPSY TRANSBRONCH 21G (NEEDLE) IMPLANT
NS IRRIG 1000ML POUR BTL (IV SOLUTION) ×12 IMPLANT
OIL SILICONE PENTAX (PARTS (SERVICE/REPAIRS)) ×3 IMPLANT
PACK CHEST (CUSTOM PROCEDURE TRAY) ×3 IMPLANT
PAD ARMBOARD 7.5X6 YLW CONV (MISCELLANEOUS) ×9 IMPLANT
PASSER SUT SWANSON 36MM LOOP (INSTRUMENTS) IMPLANT
RELOAD STAPLE 45 GOLD REG/THCK (STAPLE) IMPLANT
SCISSORS LAP 5X35 DISP (ENDOMECHANICALS) IMPLANT
SEALANT SURG COSEAL 4ML (VASCULAR PRODUCTS) IMPLANT
SEALANT SURG COSEAL 8ML (VASCULAR PRODUCTS) IMPLANT
SOLUTION ANTI FOG 6CC (MISCELLANEOUS) ×3 IMPLANT
SPONGE GAUZE 4X4 12PLY STER LF (GAUZE/BANDAGES/DRESSINGS) ×1 IMPLANT
STAPLE RELOAD 45MM GOLD (STAPLE) ×15 IMPLANT
STAPLER ECHELON POWERED (MISCELLANEOUS) ×1 IMPLANT
SUT PROLENE 3 0 SH DA (SUTURE) IMPLANT
SUT PROLENE 4 0 RB 1 (SUTURE)
SUT PROLENE 4-0 RB1 .5 CRCL 36 (SUTURE) IMPLANT
SUT SILK  1 MH (SUTURE) ×4
SUT SILK 1 MH (SUTURE) ×8 IMPLANT
SUT SILK 1 TIES 10X30 (SUTURE) IMPLANT
SUT SILK 2 0 SH (SUTURE) IMPLANT
SUT SILK 2 0 SH CR/8 (SUTURE) ×1 IMPLANT
SUT SILK 2 0SH CR/8 30 (SUTURE) IMPLANT
SUT SILK 3 0SH CR/8 30 (SUTURE) IMPLANT
SUT STEEL 1 (SUTURE) IMPLANT
SUT VIC AB 0 CTX 18 (SUTURE) ×3 IMPLANT
SUT VIC AB 1 CTX 18 (SUTURE) ×1 IMPLANT
SUT VIC AB 1 CTX 36 (SUTURE) ×3
SUT VIC AB 1 CTX36XBRD ANBCTR (SUTURE) IMPLANT
SUT VIC AB 2-0 CTX 36 (SUTURE) ×1 IMPLANT
SUT VIC AB 2-0 UR6 27 (SUTURE) ×1 IMPLANT
SUT VIC AB 3-0 SH 8-18 (SUTURE) IMPLANT
SUT VIC AB 3-0 X1 27 (SUTURE) ×1 IMPLANT
SUT VICRYL 0 UR6 27IN ABS (SUTURE) IMPLANT
SUT VICRYL 2 TP 1 (SUTURE) IMPLANT
SWAB COLLECTION DEVICE MRSA (MISCELLANEOUS) IMPLANT
SYR 20ML ECCENTRIC (SYRINGE) ×3 IMPLANT
SYSTEM SAHARA CHEST DRAIN ATS (WOUND CARE) ×3 IMPLANT
TAPE CLOTH SURG 4X10 WHT LF (GAUZE/BANDAGES/DRESSINGS) ×1 IMPLANT
TAPE UMBILICAL COTTON 1/8X30 (MISCELLANEOUS) ×3 IMPLANT
TOWEL GREEN STERILE (TOWEL DISPOSABLE) ×12 IMPLANT
TOWEL GREEN STERILE FF (TOWEL DISPOSABLE) ×6 IMPLANT
TOWEL OR 17X24 6PK STRL BLUE (TOWEL DISPOSABLE) ×6 IMPLANT
TOWEL OR 17X26 10 PK STRL BLUE (TOWEL DISPOSABLE) ×6 IMPLANT
TRAP SPECIMEN MUCOUS 40CC (MISCELLANEOUS) ×2 IMPLANT
TRAY FOLEY W/METER SILVER 16FR (SET/KITS/TRAYS/PACK) ×3 IMPLANT
TROCAR BLADELESS 12MM (ENDOMECHANICALS) ×4 IMPLANT
TUBE ANAEROBIC SPECIMEN COL (MISCELLANEOUS) IMPLANT
TUBE CONNECTING 20X1/4 (TUBING) ×3 IMPLANT
TUNNELER SHEATH ON-Q 11GX8 DSP (PAIN MANAGEMENT) ×1 IMPLANT
WATER STERILE IRR 1000ML POUR (IV SOLUTION) ×6 IMPLANT

## 2016-07-05 NOTE — Progress Notes (Signed)
      [address]       [place], [address]             [phone]      S/p RUL wedge resection  BP (!) 93/51   Pulse 83   Temp 98.9 F (37.2 C) (Oral)   Resp 16   LMP 04/07/2008   SpO2 98%    Intake/Output Summary (Last 24 hours) at 07/05/16 1720 Last data filed at 07/05/16 1500  Gross per 24 hour  Intake             1700 ml  Output              697 ml  Net             1003 ml    Doing well early postop  Remo Lipps C. Roxan Hockey, MD [facility] [phone]

## 2016-07-05 NOTE — Anesthesia Procedure Notes (Signed)
Procedure Name: Intubation Date/Time: 07/05/2016 8:18 AM Performed by: Mervyn Gay Pre-anesthesia Checklist: Patient identified, Patient being monitored, Timeout performed, Emergency Drugs available and Suction available Patient Re-evaluated:Patient Re-evaluated prior to inductionOxygen Delivery Method: Circle System Utilized Preoxygenation: Pre-oxygenation with 100% oxygen Intubation Type: IV induction Ventilation: Mask ventilation without difficulty Laryngoscope Size: Miller and 3 Grade View: Grade I Tube type: Oral Tube size: 9.0 mm Number of attempts: 1 Airway Equipment and Method: Stylet Placement Confirmation: ETT inserted through vocal cords under direct vision,  positive ETCO2 and breath sounds checked- equal and bilateral Secured at: 22 cm Tube secured with: Tape Dental Injury: Teeth and Oropharynx as per pre-operative assessment

## 2016-07-05 NOTE — Transfer of Care (Signed)
Immediate Anesthesia Transfer of Care Note  Patient: Susann Givens Neubert  Procedure(s) Performed: Procedure(s): VIDEO BRONCHOSCOPY (N/A) VIDEO ASSISTED THORACOSCOPY (VATS)/WEDGE RESECTION left upper lobe,  lymph node dissection and placement of OnQ catheter (Left)  Patient Location: PACU  Anesthesia Type:General  Level of Consciousness: awake, alert , oriented and patient cooperative  Airway & Oxygen Therapy: Patient Spontanous Breathing and Patient connected to face mask oxygen  Post-op Assessment: Report given to RN, Post -op Vital signs reviewed and stable and Patient moving all extremities X 4  Post vital signs: Reviewed and stable  Last Vitals:  Vitals:   07/05/16 0619  BP: 116/63  Pulse: 88  Resp: 18  Temp: 36.8 C    Last Pain:  Vitals:   07/05/16 0619  TempSrc: Oral      Patients Stated Pain Goal: 3 (68/03/21 2248)  Complications: No apparent anesthesia complications

## 2016-07-05 NOTE — Anesthesia Postprocedure Evaluation (Signed)
Anesthesia Post Note  Patient: Gina Cervantes  Procedure(s) Performed: Procedure(s) (LRB): VIDEO BRONCHOSCOPY (N/A) VIDEO ASSISTED THORACOSCOPY (VATS)/WEDGE RESECTION left upper lobe,  lymph node dissection and placement of OnQ catheter (Left)  Patient location during evaluation: PACU Anesthesia Type: General Level of consciousness: awake Pain management: pain level controlled Vital Signs Assessment: post-procedure vital signs reviewed and stable Respiratory status: spontaneous breathing Cardiovascular status: stable Anesthetic complications: no       Last Vitals:  Vitals:   07/05/16 1200 07/05/16 1215  BP: (!) 113/53 (!) 107/56  Pulse: 85 78  Resp: 12   Temp:      Last Pain:  Vitals:   07/05/16 1200  TempSrc:   PainSc: 0-No pain                 Sallyann Kinnaird

## 2016-07-05 NOTE — H&P (Signed)
[address]       [place] [address]             [phone]                    Gina Cervantes Medical Record #[id number] Date of Birth: [birth year]  Referring: Ladell Pier, MD Primary Care: No PCP Per Patient  Chief Complaint:    Lung mass    History of Present Illness:    Gina Cervantes 56 y.o. female is seen in the office at the request of Dr. Benay Spice. The patient has a significant history of previous malignancy including, Breast and anal cancer.   Anal cancer-left anal margin/anal canal mass, status post a biopsy on 08/09/2012 confirming invasive moderately differentiated squamous cell carcinoma,p16 positive.  -Staging PET scan 08/20/2012-hypermetabolic anal mass, no hypermetabolic metastases  -Initiation of concurrent radiation with cycle 1 of mitomycin C./5-fluorouracil 09/02/2012.  -She completed cycle 2 5-fluorouracil/mitomycin C. beginning 10/01/2012.  -She completed radiation 10/10/2012  Right breast cancer 2009, ER positive, PR positive, HER-2 positive. She is status post neoadjuvant AC x4 cycles followed by Taxol/Herceptin. She underwent a right lumpectomy and sentinel lymph node biopsy 09/22/2008 with no evidence of residual invasive carcinoma and 5 negative sentinel lymph nodes. She then completed right breast radiation. She began tamoxifen 12/15/2008 and completed adjuvant Herceptin 05/25/2009. The tamoxifen was discontinued and she was switched to Arimidex in July 2013  On  follow-up the patient had a CT scan in January and a subsequent PET scan that showed a sub-solid right lower lobe nodule that had increased in density over several years and a new left upper lobe hypermetabolic lesion highly suggestive of malignancy .  The patient is a long-term smoker and continues to intermittently smoke at times of stress .  Current Activity/ Functional Status:  Patient is independent with mobility/ambulation, transfers, ADL's,  IADL's.   Zubrod Score: At the time of surgery this patient's most appropriate activity status/level should be described as: '[]'     0    Normal activity, no symptoms '[x]'     1    Restricted in physical strenuous activity but ambulatory, able to do out light work '[]'     2    Ambulatory and capable of self care, unable to do work activities, up and about               >50 % of waking hours                              '[]'     3    Only limited self care, in bed greater than 50% of waking hours '[]'     4    Completely disabled, no self care, confined to bed or chair '[]'     5    Moribund   Past Medical History:  Diagnosis Date  . Anal cancer ([place]) 08/09/2012   Squamous cell  . Anxiety    PANIC ATTACKS  . Arthritis   . Breast cancer ([place]) 2009   ER+PR+HER-2+  . Ductal carcinoma ([place]) 02/2008    invasive   . Heart palpitations   . History of radiation therapy 09/02/12-10/10/12   anal 50.4Gy  . History of shingles 10/2014   . HPV (human papilloma virus) anogenital infection    vulvar/ freezing hx  . Lung nodule   . Pneumonia    NO  RECENT PROBLEMS  . PONV (postoperative nausea and vomiting)   . Radiation 10/21/08-12/08/08   Right breast 6240 cGy  . Status post chemotherapy 02/2008   Taxol/Herceptin    Past Surgical History:  Procedure Laterality Date  . BREAST LUMPECTOMY WITH AXILLARY LYMPH NODE BIOPSY Right 5/10   Dr. Marlou Starks  . CESAREAN SECTION    . EVALUATION UNDER ANESTHESIA WITH ANAL FISTULECTOMY N/A 08/09/2012   Procedure: EXAM UNDER ANESTHESIA AND BIOPSY OF ANAL MASS;  Surgeon: Odis Hollingshead, MD;  Location: [facility];  Service: General;  Laterality: N/A;  . KNEE ARTHROSCOPY     left knee  . PORTACATH PLACEMENT  03/2008   dr. Marlou Starks   . REMOVAL PORTACATH  2011    Family History  Problem Relation Age of Onset  . Lung cancer Father   . Stroke Mother     Social History   Social History  . Marital status: Married    Spouse name: N/A  . Number of children: N/A  . Years of  education: N/A   Occupational History  .  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 Smoking status: Current Some Day Smoker    Years: 8.00    Types: Cigarettes    Last attempt to quit: 06/05/2013  . Smokeless tobacco: Never Used     Comment: smokes 3-5 per week   . Alcohol use 0.0 - 6.0 oz/week  . Drug use: No  . Sexual activity: Yes    Partners: Male    Birth control/ protection: Other-see comments     Comment: vasectomy    Social History Narrative   Married   Lost a son age 45 this year and husband's brother, still going through grieving process.   No family history of breast or ovarian cancer.   Employed as Realtor    History  Smoking Status  . Former Smoker  . Years: 8.00  . Types: Cigarettes  . Quit date: 06/05/2013  Smokeless Tobacco  . Never Used    Comment: stopped smoking cigarettes Jan. 2018    History  Alcohol Use  . 12.6 oz/week  . 14 Glasses of wine, 7 Shots of liquor per week    Comment: daily      Allergies  Allergen Reactions  . No Known Allergies     Current Facility-Administered Medications  Medication Dose Route Frequency Provider Last Rate Last Dose  . cefUROXime (ZINACEF) 1.5 g in dextrose 5 % 50 mL IVPB  1.5 g Intravenous 60 min Pre-Op [name], MD          Review of Systems:     Cardiac Review of Systems: Y or N  Chest Pain   n    Resting SOB  n   Exertional SOB   n   Orthopnea n     Pedal Edema n       Palpitations n   Syncope  n     Presyncope n     General Review of Systems: Y = yes   =no Constitional: recent weight change n  ;  Wt loss over the last 3 months     anorexia   ; fatigue   ; nausea   ; night sweats n  ; fever   ; or chills   ;          Dental: poor dentition  ; Last Dentist visit:   Eye : blurred vision   ;  diplopia    ; vision changes   ;  Amaurosis fugax  ; Resp: cough   ;  wheezing  ;  hemoptysis  ; shortness of breath  ; paroxysmal nocturnal dyspnea  ; dyspnea on  exertion  ; or orthopnea  ;  GI:  gallstones  , vomiting  ;  dysphagia  ; melena  ;  hematochezia   ; heartburn  ;   Hx of  Colonoscopy  ; GU: kidney stones   ; hematuria  ;   dysuria   ;  nocturia  ;  history of     obstruction   ; urinary frequency                Skin: rash, swelling  ;, hair loss  ;  peripheral edema  ;  or itching  ; Musculosketetal: myalgias  ;  joint swelling  ;  joint erythema  ;  joint pain  ;  back pain  ;  Heme/Lymph: bruising  ;  bleeding  ;  anemia  ;  Neuro: TIA n ;  headaches  ;  stroke n ;  vertigo  ;  seizures  ;   paresthesias  ;  difficulty walking n ;  Psych:depression  ; anxiety  ;  Endocrine: diabetes  ;  thyroid dysfunction  ;  Immunizations: Flu up to date   ; Pneumococcal up to date   ;  Other: Patient denies any current symptoms of her rectal cancer  Physical Exam: BP 116/63   Pulse 88   Temp 98.3 F (36.8 C) (Oral)   Resp 18   LMP 04/07/2008   SpO2 97%   PHYSICAL EXAMINATION: General appearance: alert, cooperative and appears stated age Head: Normocephalic, without obvious abnormality, atraumatic Neck: no adenopathy, no carotid bruit, no JVD, supple, symmetrical, trachea midline and thyroid not enlarged, symmetric, no tenderness/mass/nodules Lymph nodes: Cervical, supraclavicular, and axillary nodes normal. Resp: clear to auscultation bilaterally Back: symmetric, no curvature. ROM normal. No CVA tenderness. Cardio: regular rate and rhythm, S1, S2 normal, no murmur, click, rub or gallop GI: soft, non-tender; bowel sounds normal; no masses,  no organomegaly Extremities: extremities normal, atraumatic, no cyanosis or edema and Homans sign is negative, no sign of DVT Neurologic: Grossly normal  Diagnostic Studies & Laboratory data:     Recent Radiology Findings: Dg Chest 2 View  Result Date: 07/04/2016 CLINICAL DATA:  Preoperative examination prior to left upper llobectomy for a lung nodule. No current chest complaints. History of breast  malignancy. EXAM: CHEST  2 VIEW COMPARISON:  PET-CT study of June 22, 2016 and chest CT scan of June 13, 2016 FINDINGS: The lungs are mildly hyperinflated. There is no alveolar infiltrate. There is subtle nodularity inferiorly in the left upper lobe. The heart and pulmonary vascularity are normal. The mediastinum is normal in width. There is no pleural effusion. The trachea is midline. The bony thorax exhibits no acute abnormality. IMPRESSION: COPD. No CHF nor pneumonia. Abnormal nodule inferiorly in the left upper lobe consistent with the consistent with previous PET-CT study. Electronically Signed   By: David  Martinique M.D.   On: 07/04/2016 09:28     Ct Chest W Contrast  Result Date: 06/13/2016 CLINICAL DATA:  Right lower lobe pulmonary nodule. Anal cancer diagnosed 4/14. Radiation therapy. Breast cancer in 2010 with chemotherapy and radiation therapy after lumpectomy. EXAM: CT CHEST WITH CONTRAST TECHNIQUE: Multidetector CT imaging of the chest was performed during intravenous contrast administration. CONTRAST:  54m  ISOVUE-300 IOPAMIDOL (ISOVUE-300) INJECTION 61% COMPARISON:  10/24/2013 FINDINGS: Cardiovascular: Aortic and branch vessel atherosclerosis. Normal heart size, without pericardial effusion. Multivessel coronary artery atherosclerosis. No central pulmonary embolism, on this non-dedicated study. Mediastinum/Nodes: No supraclavicular adenopathy. No mediastinal adenopathy. Borderline right hilar adenopathy at 11 mm on image 67/series 2 is felt to be similar. Lungs/Pleura: No pleural fluid. Somewhat ill-defined, possibly sub solid pulmonary nodule measures 9 mm in the right upper lobe posteriorly. Significantly enlarged since the prior. Example image 39/ series 3. An irregular right lower lobe nodular density measures on the order of 1.8 x 1.4 cm on image 87/series 2. Appears more well-defined today than on the prior exam, where it measured 1.4 cm. This difference could be secondary to thinner  slice collimation today. 3 mm posterior right upper lobe subpleural pulmonary nodule on image 64/ series 3 is unchanged and can be considered benign. Right upper lobe 6 mm ground-glass nodule is felt to be unchanged, including on image 43/series 3. A 4 mm ill-defined posterior right upper lobe nodule on image 45/ series 3 is also felt to be similar, given differences in slice thickness. Sub solid 5 mm left upper lobe pulmonary nodule on image 29/series 3 is unchanged. Vague left upper lobe 5 mm ground-glass nodule on image 73/series 3, likely new. Left lower lobe 10 mm ground-glass nodule on image 92/series 3 is more apparent today than on the prior, where it measured on the order of 5 mm. A posterior left upper lobe relatively well-circumscribed solid nodule measures 11 x 10 mm on image 54/ series 3 and is new. Upper Abdomen: Tiny well-circumscribed low-density liver lesions are likely cysts and were present previously. Normal imaged portions of the spleen, stomach, pancreas, gallbladder, biliary tract, adrenal glands, kidneys. Abdominal aortic atherosclerosis. Musculoskeletal: No axillary or subpectoral adenopathy. No acute osseous abnormality. IMPRESSION: 1. A posterior left upper lobe solid 11 mm nodule is suspicious for metastatic disease versus less likely attack metachronous primary bronchogenic carcinoma. Consider tissue sampling versus further evaluation with PET. 2. Other solid and sub solid pulmonary nodules are primarily similar. 10 mm left lower lobe and 9 mm right upper lobe sub solid nodules have enlarged. Apparent increase in size of a solid right lower lobe nodule, favored to be due to thinner slice collimation today. 3. No thoracic adenopathy. 4. Age advanced coronary artery atherosclerosis. Recommend assessment of coronary risk factors and consideration of medical therapy. Electronically Signed   By: Abigail Miyamoto M.D.   On: 06/13/2016 14:36   Nm Pet Image Initial (pi) Skull Base To  Thigh  Result Date: 06/22/2016 CLINICAL DATA:  Subsequent treatment strategy for breast and anal cancer. New left lung nodule. EXAM: NUCLEAR MEDICINE PET SKULL BASE TO THIGH TECHNIQUE: 7.7 mCi F-18 FDG was injected intravenously. Full-ring PET imaging was performed from the skull base to thigh after the radiotracer. CT data was obtained and used for attenuation correction and anatomic localization. FASTING BLOOD GLUCOSE:  Value: 87 mg/dl COMPARISON:  08/20/2012 FINDINGS: NECK No hypermetabolic lymph nodes in the neck. CHEST Aortic atherosclerosis. Calcification involving the LAD coronary artery. No hypermetabolic mediastinal or hilar nodes. Left upper lobe lung nodule measures 1 cm. This exhibits malignant range FDG uptake within SUV max equal to 7.3, image 25 of series 8. No additional hypermetabolic pulmonary nodules identified. This includes the irregular-shaped 1.4 x 1.8 cm nodular density in the right lower lobe which has an SUV max equal to 1.35, image 41 of series 8. ABDOMEN/PELVIS No abnormal hypermetabolic activity within  the liver, pancreas, adrenal glands, or spleen. Left renal calculus. Aortic atherosclerosis. No hypermetabolic lymph nodes in the abdomen or pelvis. SKELETON No focal hypermetabolic activity to suggest skeletal metastasis. IMPRESSION: 1. Malignant range FDG uptake is associated with the solid 11 mm nodule within the left upper lobe which may represent metastatic disease or attack hernias primary bronchogenic carcinoma. Correlation with tissue sampling is advised. 2. No additional hypermetabolic pulmonary nodules are identified at this time including the irregularly-shaped nodule within the right lower lobe. 3. Kidney stone. 4. Aortic atherosclerosis and coronary artery calcification. Electronically Signed   By: Kerby Moors M.D.   On: 06/22/2016 09:27     I have independently reviewed the above radiologic studies.  Recent Lab Findings: Lab Results  Component Value Date   WBC  5.8 07/04/2016   HGB 15.2 (H) 07/04/2016   HCT 44.7 07/04/2016   PLT 255 07/04/2016   GLUCOSE 103 (H) 07/04/2016   CHOL 224 (H) 04/07/2016   TRIG 66 04/07/2016   HDL 81 04/07/2016   LDLCALC 130 (H) 04/07/2016   ALT 15 07/04/2016   AST 18 07/04/2016   NA 138 07/04/2016   K 4.6 07/04/2016   CL 98 (L) 07/04/2016   CREATININE 0.64 07/04/2016   BUN 11 07/04/2016   CO2 31 07/04/2016   TSH 3.173 12/25/2014   INR 1.00 07/04/2016   PFT"s FEV1 1.48 52%  Increased 1.88 66% 26% increase with bronch dilator  DLCO 25.6  69%  The FVC, FEV1, FEV1/FVC ratio and FEF25-75% are reduced indicating airway obstruction. The SVC is reduced, but the TLC is within normal limits. Following administration of bronchodilators, there is a significant response indicated by the increased FVC. The reduced diffusing capacity indicates a mild loss of functional alveolar capillary surface. However, the diffusing capacity was not corrected for the patient's hemoglobin. Pulmonary Function Diagnosis: Moderately severe Obstructive Airways Disease with reversibility Mild Diffusion Defect   Assessment / Plan:   The patient has  new left upper lobe lung lesion markedly hypermetabolic that has not been present on previous CT scans, and highly suspicious for malignancy. It's unclear if this is an isolated pulmonary metastasis from breast cancer,  anal cancer or a possible primary lung cancer. It does appear radiographically different and has behaved biologically different than the sub-solid lesion in the right lung. The patient was presented at the multidisciplinary thoracic oncology conference and the GI conference.  After discussion with patient and Dr. Benay Spice plan to proceed with bronchoscopy left video-assisted thoracoscopy wedge resection of the left upper lobe lung nodule possible node dissection. Wrist and options were discussed with the patient and husband in detail.   The patient had somewhat limited pulmonary  function studies but did have a very significant increase in her FEV1 with bronchodilators. I have given her prescription of albuterol to use for the next several days prior to surgery. She has stopped smoking since she was seen in the office last week.  The goals risks and alternatives of the planned surgical procedure Bronchoscopy left VATS lung resection  have been discussed with the patient in detail. The risks of the procedure including death, infection, stroke, myocardial infarction, bleeding, blood transfusion have all been discussed specifically.  I have quoted Susann Givens Hard a 2 % of perioperative mortality and a complication rate as high as 30 %. The patient's questions have been answered.Donis Kotowski Mould is willing  to proceed with the planned procedure.  [name] MD  [address] [place] [address] Office [phone]   Beeper [phone]  07/05/2016 6:54 AM

## 2016-07-05 NOTE — Anesthesia Procedure Notes (Signed)
Anesthesia Procedure Note     

## 2016-07-05 NOTE — Care Management Note (Addendum)
Case Management Note  Patient Details  Name: Gina Cervantes MRN: [id number] Date of Birth: 1960/05/30  Subjective/Objective:   From home with spouse,  She has medication coverage per spouse and she has transportation at dc, but she does not have a pcp per spouse, will need [facility] information, pta indep.   S/p VATS, wedge resection, with bronch,  Chest tube to suction, fentanyl pca.  NCM will cont to follow for dc needs.   3/1 [place], BSN - POD 1 vats wedge resection, SR 70's, on 6 liters oxygen via [place], will wean as able, dc one chest tube the other chest tube to water seal, cxr today no ptx. Advance diet, decrease ivf,  NCM will cont to follow for dc needs.  Patient states she sees Development worker, community on [place] .  NCM gave her the [employer] information also.                   Action/Plan:   Expected Discharge Date:                  Expected Discharge Plan:  Home/Self Care  In-House Referral:     Discharge planning Services  CM Consult  Post Acute Care Choice:    Choice offered to:     DME Arranged:    DME Agency:     HH Arranged:    HH Agency:     Status of Service:  In process, will continue to follow  If discussed at Long Length of Stay Meetings, dates discussed:    Additional Comments:  Zenon Mayo, RN 07/05/2016, 3:19 PM

## 2016-07-05 NOTE — Addendum Note (Signed)
Addendum  created 07/05/16 1750 by Belinda Block, MD   Anesthesia Intra Blocks edited, Child order released for a procedure order, Image imported, Sign clinical note

## 2016-07-05 NOTE — Brief Op Note (Addendum)
      [address]       [place] [address]             [phone]      07/05/2016  11:12 AM  PATIENT:  Gina Cervantes  56 y.o. female  PRE-OPERATIVE DIAGNOSIS:  Left  UPPER LOBE LUNG NODULE  POST-OPERATIVE DIAGNOSIS:  ADENOSQUAMOUS CARCINOMA of RUL  PROCEDURE:  VIDEO BRONCHOSCOPY, Left  VIDEO ASSISTED THORACOSCOPY (VATS), WEDGE RESECTION OF RIGHT UPPER LOBE, LYMPH NODE DISSECTION, and ON Q PLACEMENT  SURGEON:  Surgeon(s) and Role:    * [name], MD - Primary  PHYSICIAN ASSISTANT: Lars Pinks PA-C  ANESTHESIA:   general  EBL:  Total I/O In: 3832 [I.V.:1600; IV Piggyback:50] Out: 350 [Urine:300; Blood:50]  BLOOD ADMINISTERED:none  DRAINS: A 28 French chest tube and a 28 Blake drain placed in the left pleural space   LOCAL MEDICATIONS USED:  MARCAINE     SPECIMEN:  Source of Specimen:  Wedge RUL, multiple lymph nodes  DISPOSITION OF SPECIMEN:  Pathology. 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   Delay start of Pharmacological VTE agent (>24hrs) due to surgical blood loss or risk of bleeding: yes

## 2016-07-05 NOTE — Anesthesia Procedure Notes (Signed)
Procedure Name: Intubation Date/Time: 07/05/2016 8:34 AM Performed by: Mervyn Gay Pre-anesthesia Checklist: Patient identified, Patient being monitored, Timeout performed, Emergency Drugs available and Suction available Patient Re-evaluated:Patient Re-evaluated prior to inductionOxygen Delivery Method: Circle System Utilized Preoxygenation: Pre-oxygenation with 100% oxygen Intubation Type: Inhalational induction Ventilation: Mask ventilation without difficulty Laryngoscope Size: Miller and 2 Grade View: Grade I Tube type: Oral Endobronchial tube: Double lumen EBT, Left, EBT position confirmed by fiberoptic bronchoscope and EBT position confirmed by auscultation and 35 Fr Number of attempts: 1 Airway Equipment and Method: Stylet Placement Confirmation: ETT inserted through vocal cords under direct vision,  positive ETCO2 and breath sounds checked- equal and bilateral Tube secured with: Tape Dental Injury: Teeth and Oropharynx as per pre-operative assessment

## 2016-07-05 NOTE — Anesthesia Procedure Notes (Signed)
Anesthesia Procedure Note .ce

## 2016-07-05 NOTE — Anesthesia Procedure Notes (Signed)
Anesthesia Procedure Note Left IJ CVP Dual Lumen: [id number]: The patient was identified and consent obtained.  TO was performed, and full barrier precautions were used.  The skin was anesthetized with lidocaine.  Once the vein was located with the 22 ga. needle using ultrasound guidance , the wire was inserted into the vein.  The wire location was confirmed with ultrasound.  The tissue was dilated and the catheter was carefully inserted, then sutured in place. A dressing was applied. The patient tolerated the procedure well.

## 2016-07-05 NOTE — Anesthesia Preprocedure Evaluation (Signed)
Anesthesia Evaluation  Patient identified by MRN, date of birth, ID band Patient awake    Reviewed: Allergy & Precautions, NPO status , Patient's Chart, lab work & pertinent test results  History of Anesthesia Complications (+) PONV  Airway Mallampati: II  TM Distance: >3 FB     Dental   Pulmonary pneumonia, former smoker,    breath sounds clear to auscultation       Cardiovascular negative cardio ROS   Rhythm:Regular Rate:Normal     Neuro/Psych    GI/Hepatic negative GI ROS, Neg liver ROS,   Endo/Other  negative endocrine ROS  Renal/GU negative Renal ROS     Musculoskeletal   Abdominal   Peds  Hematology   Anesthesia Other Findings   Reproductive/Obstetrics                             Anesthesia Physical Anesthesia Plan  ASA: III  Anesthesia Plan: General   Post-op Pain Management:    Induction: Intravenous  Airway Management Planned: Oral ETT  Additional Equipment: Arterial line  Intra-op Plan:   Post-operative Plan: Possible Post-op intubation/ventilation  Informed Consent: I have reviewed the patients History and Physical, chart, labs and discussed the procedure including the risks, benefits and alternatives for the proposed anesthesia with the patient or authorized representative who has indicated his/her understanding and acceptance.   Dental advisory given  Plan Discussed with: CRNA and Anesthesiologist  Anesthesia Plan Comments:         Anesthesia Quick Evaluation

## 2016-07-06 ENCOUNTER — Encounter (HOSPITAL_COMMUNITY): Payer: Self-pay | Admitting: Cardiothoracic Surgery

## 2016-07-06 LAB — BASIC METABOLIC PANEL
Anion gap: 5 (ref 5–15)
BUN: 6 mg/dL (ref 6–20)
CO2: 32 mmol/L (ref 22–32)
Calcium: 8.4 mg/dL — ABNORMAL LOW (ref 8.9–10.3)
Chloride: 103 mmol/L (ref 101–111)
Creatinine, Ser: 0.58 mg/dL (ref 0.44–1.00)
GFR calc Af Amer: >60 mL/min (ref >60)
GFR calc non Af Amer: >60 mL/min (ref >60)
Glucose, Bld: 120 mg/dL — ABNORMAL HIGH (ref 65–99)
Potassium: 4.1 mmol/L (ref 3.5–5.1)
Sodium: 140 mmol/L (ref 135–145)

## 2016-07-06 LAB — CBC
HCT: 35.8 % — ABNORMAL LOW (ref 36.0–46.0)
Hemoglobin: 11.8 g/dL — ABNORMAL LOW (ref 12.0–15.0)
MCH: 33.6 pg (ref 26.0–34.0)
MCHC: 33 g/dL (ref 30.0–36.0)
MCV: 102 fL — ABNORMAL HIGH (ref 78.0–100.0)
Platelets: 188 10*3/uL (ref 150–400)
RBC: 3.51 MIL/uL — ABNORMAL LOW (ref 3.87–5.11)
RDW: 13.1 % (ref 11.5–15.5)
WBC: 10.5 10*3/uL (ref 4.0–10.5)

## 2016-07-06 LAB — GLUCOSE, CAPILLARY: Glucose-Capillary: 138 mg/dL — ABNORMAL HIGH (ref 65–99)

## 2016-07-06 MED ORDER — GUAIFENESIN ER 600 MG PO TB12
600.0000 mg | ORAL_TABLET | Freq: Two times a day (BID) | ORAL | Status: DC
  Administered 2016-07-06 – 2016-07-08 (×5): 600 mg via ORAL
  Filled 2016-07-06 (×5): qty 1

## 2016-07-06 MED ORDER — SODIUM CHLORIDE 0.9% FLUSH
10.0000 mL | INTRAVENOUS | Status: DC | PRN
  Administered 2016-07-07: 10 mL
  Filled 2016-07-06: qty 40

## 2016-07-06 NOTE — Discharge Instructions (Signed)
Thoracotomy, Care After ° °This sheet gives you information about how to care for yourself after your procedure. Your doctor may also give you more specific instructions. If you have problems or questions, contact your doctor. °Follow these instructions at home: °Preventing lung infection (  °pneumonia) °· Take deep breaths or do breathing exercises as told by your doctor. °· Cough often. Coughing is important to clear thick spit (phlegm) and open your lungs. If coughing hurts, hold a pillow against your chest or place both hands flat on top of your cut (splinting) when you cough. This may help with discomfort. °· Use an incentive spirometer as told. This is a tool that measures how well you fill your lungs with each breath. °· Do lung therapy (pulmonary rehabilitation) as told. °Medicines °· Take over-the-counter or prescription medicines only as told by your doctor. °· If you have pain, take pain-relieving medicine before your pain gets very bad. This will help you breathe and cough more comfortably. °· If you were prescribed an antibiotic medicine, take it as told by your doctor. Do not stop taking the antibiotic even if you start to feel better. °Activity °· Ask your doctor what activities are safe for you. °· Do not travel by airplane for 2 weeks after your chest tube is removed, or until your doctor says that this is safe. °· Do not lift anything that is heavier than 10 lb (4.5 kg), or the limit that your doctor tells you, until he or she says that it is safe. °· Do not drive until your doctor approves. °¨ Do not drive or use heavy machinery while taking prescription pain medicine. °Incision care °· Follow instructions from your doctor about how to take care of your cut from surgery (incision). Make sure you: °¨ Wash your hands with soap and water before you change your bandage (dressing). If you cannot use soap and water, use hand sanitizer. °¨ Change your bandage as told by your doctor. °¨ Leave stitches  (sutures), skin glue, or skin tape (adhesive) strips in place. They may need to stay in place for 2 weeks or longer. If tape strips get loose and curl up, you may trim the loose edges. Do not remove tape strips completely unless your doctor says it is okay. °· Keep your bandage dry. °· Check your cut from surgery every day for signs of infection. Check for: °¨ More redness, swelling, or pain. °¨ More fluid or blood. °¨ Warmth. °¨ Pus or a bad smell. °Bathing °· Do not take baths, swim, or use a hot tub until your doctor approves. You may take showers. °· After your bandage has been removed, use soap and water to gently wash your cut from surgery. Do not use anything else to clean your cut unless your doctor tells you to. °Eating and drinking °· Eat a healthy diet as told by your doctor. A healthy diet includes: °¨ Fresh fruits and vegetables. °¨ Whole grains. °¨ Low-fat (lean) proteins. °· Drink enough fluid to keep your pee (urine) clear or pale yellow. °General instructions °· To prevent or treat trouble pooping (constipation) while you are taking prescription pain medicine, your doctor may recommend that you: °¨ Take over-the-counter or prescription medicines. °¨ Eat foods that are high in fiber. These include fresh fruits and vegetables, whole grains, and beans. °¨ Limit foods that are high in fat and processed sugars, such as fried and sweet foods. °· Do not use any products that contain nicotine or tobacco. These   include cigarettes and e-cigarettes. If you need help quitting, ask your doctor. °· Avoid secondhand smoke. °· Wear compression stockings as told. These help to prevent blood clots and reduce swelling in your legs. °· If you have a chest tube, care for it as told. °· Keep all follow-up visits as told by your doctor. This is important. °Contact a doctor if: °· You have more redness, swelling, or pain around your cut from surgery. °· You have more fluid or blood coming from your cut from  surgery. °· Your cut from surgery feels warm to the touch. °· You have pus or a bad smell coming from your cut from surgery. °· You have a fever or chills. °· Your heartbeat seems uneven. °· You feel sick to your stomach (nauseous). °· You throw up (vomit). °· You have muscle aches. °· You have trouble pooping (having a bowel movement). This may mean that you: °¨ Poop fewer times in a week than normal. °¨ Have a hard time pooping. °¨ Have poop that is dry, hard, or bigger than normal. °Get help right away if: °· You get a rash. °· You feel light-headed. °· You feel like you might pass out (faint). °· You are short of breath. °· You have trouble breathing. °· You are confused. °· You have trouble talking. °· You have problems with your seeing (vision). °· You are not able to move. °· You lose feeling (have numbness) in your: °¨ Face. °¨ Arms. °¨ Legs. °· You pass out. °· You have a sudden, bad headache. °· You feel weak. °· You have chest pain. °· You have pain that: °¨ Is very bad. °¨ Gets worse, even with medicine. °Summary °· Take deep breaths, do breathing exercises, and cough often. This helps prevent lung infection (pneumonia). °· Do not drive until your doctor approves. Do not travel by airplane for 2 weeks after your chest tube is removed, or until your doctor says that this is safe. °· Check your cut from surgery every day for signs of infection. °· Eat a healthy diet. This includes fresh fruits and vegetables, whole grains, and low-fat (lean) proteins. °This information is not intended to replace advice given to you by your health care provider. Make sure you discuss any questions you have with your health care provider. °Document Released: 10/24/2011 Document Revised: 01/17/2016 Document Reviewed: 01/17/2016 °Elsevier Interactive Patient Education © 2017 [employer] ° °

## 2016-07-06 NOTE — Progress Notes (Signed)
RT NOTE:  Pt refused morning ABG. She stated she had a ALINE in and it was pulled yesterday and she does not want to be stuck again. RN at bedside as witness.

## 2016-07-06 NOTE — Progress Notes (Addendum)
[facility] DAILY ICU PROGRESS NOTE                   [address]            [place] [address]          [phone]   1 Day Post-Op Procedure(s) (LRB): VIDEO BRONCHOSCOPY (N/A) VIDEO ASSISTED THORACOSCOPY (VATS)/WEDGE RESECTION left upper lobe,  lymph node dissection and placement of OnQ catheter (Left)  Total Length of Stay:  LOS: 1 day   Subjective: Patient sitting in chair. Dr. Benay Spice has already seen her this am. Her only complaint is construction noise.  Objective: Vital signs in last 24 hours: Temp:  [98.2 F (36.8 C)-100.3 F (37.9 C)] 98.6 F (37 C) (03/01 0400) Pulse Rate:  [63-98] 71 (03/01 0700) Cardiac Rhythm: Normal sinus rhythm (03/01 0400) Resp:  [9-24] 17 (03/01 0700) BP: (89-127)/(39-65) 102/62 (03/01 0700) SpO2:  [91 %-100 %] 99 % (03/01 0700) Arterial Line BP: (110-136)/(55-87) 125/56 (02/28 1700) Weight:  [155 lb 13.8 oz (70.7 kg)] 155 lb 13.8 oz (70.7 kg) (03/01 0032)  Filed Weights   07/06/16 0032  Weight: 155 lb 13.8 oz (70.7 kg)      Intake/Output from previous day: 02/28 0701 - 03/01 0700 In: 3686.7 [P.O.:400; I.V.:3186.7; IV Piggyback:100] Out: 3100 [Urine:2850; Blood:50; Chest Tube:200]  Intake/Output this shift: No intake/output data recorded.  Current Meds: Scheduled Meds: . acetaminophen  1,000 mg Oral Q6H   Or  . acetaminophen (TYLENOL) oral liquid 160 mg/5 mL  1,000 mg Oral Q6H  . anastrozole  1 mg Oral Daily  . bisacodyl  10 mg Oral Daily  . bupivacaine 0.5 % ON-Q pump SINGLE CATH 400 mL  400 mL Other To OR  . cefUROXime (ZINACEF)  IV  1.5 g Intravenous Q12H  . fentaNYL   Intravenous Q4H  . insulin aspart  0-24 Units Subcutaneous Q6H  . levalbuterol  0.63 mg Nebulization TID  . mouth rinse  15 mL Mouth Rinse BID  . multivitamin with minerals  1 tablet Oral Daily  . pantoprazole  40 mg Oral Daily  . senna-docusate  1 tablet Oral QHS   Continuous Infusions: . dextrose 5 % and 0.9% NaCl 100 mL/hr at 07/06/16  0700   PRN Meds:.diphenhydrAMINE **OR** diphenhydrAMINE, levalbuterol, loratadine, LORazepam, naloxone **AND** sodium chloride flush, ondansetron (ZOFRAN) IV, oxyCODONE, potassium chloride (KCL MULTIRUN) 30 mEq in 265 mL IVPB, traMADol  General appearance: alert, cooperative and no distress Neurologic: intact Heart: RRR Lungs: Coarse breath sounds Abdomen: Soft, non tender, bowel sounds present Wound: Dressing is clean and dry Chest tubes: to suction, tidling   Lab Results: CBC: Recent Labs  07/04/16 0855 07/06/16 0430  WBC 5.8 10.5  HGB 15.2* 11.8*  HCT 44.7 35.8*  PLT 255 188   BMET:  Recent Labs  07/04/16 0855 07/06/16 0430  NA 138 140  K 4.6 4.1  CL 98* 103  CO2 31 32  GLUCOSE 103* 120*  BUN 11 6  CREATININE 0.64 0.58  CALCIUM 9.4 8.4*    CMET: Lab Results  Component Value Date   WBC 10.5 07/06/2016   HGB 11.8 (L) 07/06/2016   HCT 35.8 (L) 07/06/2016   PLT 188 07/06/2016   GLUCOSE 120 (H) 07/06/2016   CHOL 224 (H) 04/07/2016   TRIG 66 04/07/2016   HDL 81 04/07/2016   LDLCALC 130 (H) 04/07/2016   ALT 15 07/04/2016   AST 18 07/04/2016   NA 140 07/06/2016   K  4.1 07/06/2016   CL 103 07/06/2016   CREATININE 0.58 07/06/2016   BUN 6 07/06/2016   CO2 32 07/06/2016   TSH 3.173 12/25/2014   INR 1.00 07/04/2016      PT/INR:  Recent Labs  07/04/16 0855  LABPROT 13.2  INR 1.00   Radiology: Dg Chest Port 1 View  Result Date: 07/05/2016 CLINICAL DATA:  Postop for left thoracotomy EXAM: PORTABLE CHEST 1 VIEW COMPARISON:  07/04/2016 FINDINGS: Cardiomediastinal silhouette is stable. Two left side chest tubes are noted. There are postsurgical changes with some nodular consolidation and suture line in left upper lobe. No pneumothorax. There is some linear atelectasis or scarring right midlung peripheral. No pulmonary edema. No evidence of pneumothorax. Left IJ central line with tip in SVC. IMPRESSION: Two left side chest tubes are noted. There are postsurgical  changes with some nodular consolidation and suture line in left upper lobe. No pneumothorax. There is some linear atelectasis or scarring right midlung peripheral. No pulmonary edema. No evidence of pneumothorax. Left IJ central line with tip in SVC. Electronically Signed   By: Lahoma Crocker M.D.   On: 07/05/2016 12:21     Assessment/Plan: S/P Procedure(s) (LRB): VIDEO BRONCHOSCOPY (N/A) VIDEO ASSISTED THORACOSCOPY (VATS)/WEDGE RESECTION left upper lobe,  lymph node dissection and placement of OnQ catheter (Left)  1. CV-SR in the 70's. 2. Pulmonary-On 6 liters of oxygen via [place]. Will wean as able. Chest tubes with 200 cc output since surgery. Chest tubes are to suction. There is no air leak. CXR this am appears to show no pneumothorax, some consolidation and suture in the LUL. Encourage incentive spirometer. Await final pathology. 3. CBGs 164/138. Will stop accu checks as is not diabetic and advance diet 4. Decrease IVF 5. 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  [name] MD [facility] [phone] Office [phone] 07/06/2016 7:52 AM

## 2016-07-06 NOTE — Discharge Summary (Signed)
Physician Discharge Summary       [address]       [place] [address]             [id number]    Patient ID: Gina Cervantes MRN: [id number] DOB/AGE: 09-Dec-1960 56 y.o.  Admit date: 07/05/2016 Discharge date: 07/08/2016  Admission Diagnoses: Left upper lobe lung nodule  Active Diagnoses:  1. Adenocarcinoma of the LUL 2. Anal cancer (HCC)-Squamous cell 14' 3. Breast cancer (HCC)-ER+PR+HER-2+ 4. Anxiety 5. Arthritis 6. HPV (human papilloma virus) anogenital infection 7. History of shingles 8. S/p chem and radiation 9. Tobacco abuse 10. ABL anemia  Procedure (s):  VIDEO BRONCHOSCOPY, Left  VIDEO ASSISTED THORACOSCOPY (VATS), WEDGE RESECTION OF LEFT UPPER LOBE, LYMPH NODE DISSECTION, and ON Q PLACEMENT by Dr. Servando Snare on 07/05/2016.  Pathology: FINAL DIAGNOSIS Diagnosis 1. 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 - MARGINS ARE NEGATIVE. - SEE ONCOLOGY TEMPLATE. 2. Lymph node, biopsy, 10 L - ONE BENIGN LYMPH NODE WITH NO TUMOR SEEN (0/1). 3. Lymph node, biopsy, 12 L - ONE BENIGN LYMPH NODE WITH NO TUMOR SEEN (0/1). 4. Lymph node, biopsy, 11 L - ONE BENIGN LYMPH NODE WITH NO TUMOR SEEN (0/1). 5. Lymph node, biopsy, 11 L #2 - ONE BENIGN LYMPH NODE WITH NO TUMOR SEEN (0/1). Microscopic Comment 1. LUNG Specimen, including laterality: Left upper lobe lung wedge with lymph nodes. Procedure: Left upper lobe lung wedge resection with lymph node dissection. Specimen integrity (intact/disrupted): Intact. Tumor site: Left upper lobe lung. Tumor focality: Unifocal. Maximum tumor size (cm): 1.5 cm. Histologic type: Adenosquamous carcinoma. Grade: Well differentiated (low grade). Margins: Negative. Distance to closest margin (cm): 0.5 cm. Visceral pleura invasion: Not identified. Tumor extension: Tumor confined to lung parenchyma. Treatment effect (if  treated with neoadjuvant therapy): Not applicable. Lymph -Vascular invasion: Not identified. Lymph nodes: Number examined - 4 ; Number N1 nodes positive- 0 ; Number N2 nodes positive - 0. 1 of 3 FINAL for Credit, Dlisa O ([id number]) Microscopic Comment(continued) TNM code: pT1b, pN0. Ancillary studies: Can be performed upon request. Non-neoplastic lung: No significant non-neoplastic findings. Comments: Immunohistochemical stains for cytokeratin 7, cytokeratin 20, cytokeratin 5/6, p63, TTF-1, estrogen receptor and CDX-2 are performed after review of the H&E stained sections. The tumor is strongly positive for TTF-1 and cytokeratin 7. The tumor shows patchy cytokeratin 5/6 staining and p63 demonstrates rare scattered positivity. The morphology coupled with the staining pattern is consistent with adenosquamous carcinoma of the lung. Dr. Gari Crown has seen this case in consultation with agreement of primary tumor type. The case is discussed with Dr. Servando Snare on 07/07/16. (RAH:gt, 07/07/16) Willeen Niece MD Pathologist, Electronic Signature (Case signed 07/07/2016) Intraoperative Diagnosis 1. FROZEN SECTION DIAGNOSIS, LEFT UPPER LOBE: CARCINOMA, MARGINS NOT INVOLVED. (JDP) Specimen Gross and Clinical Information Specimen(s) Obtained: 1. Lung, wedge biopsy/resection, Left upper lobe 2. Lymph node, biopsy, 10 L 3. Lymph node, biopsy, 12 L 4. Lymph node, biopsy, 11 L 5. Lymph node, biopsy, 11 L #2 Specimen Clinical Information 1. Lung Nodule (tl) Gross 1. The specimen is received fresh for rapid intraoperative consultation, and consists of a 4.1 x 3.4 x 2.1 cm piece of tan-pink lung parenchyma with multiple staple lines. The pleural surface is smooth with moderate anthracosis. Sectioning reveals a tan-pink spongy parenchyma, with a 1.5 x 1.2 x 1.0 cm tan-gray, firm, ill-defined lesion identified. The lesion measures approximately 0.5 cm to the closest resection margin, and 0.8  cm to the  second closest resection margin. The two closest margins are submitted for frozen section analysis. The closest overlying pleural surface is inked black. Representative sections are submitted in six cassettes. A = margin frozen section remnant, closest to lesion. B = margin frozen section remnant, second closest to lesion. C = mass frozen section remnant. D, E = additional sections of mass. F = grossly involved parenchyma. 2. The specimen is received fresh and consists of a 1.0 x 1.0 x 0.7 cm tan-red, anthracotic possible lymph node. The specimen is bisected and entirely submitted in one cassette. 3. The specimen is received fresh and consists of a 0.3 x 0.2 x 0.1 cm piece of tan-gray soft tissue. The specimen is entirely submitted in one cassette. 4. The specimen is received fresh and consists of a 1.7 x 0.8 x 0.8 cm tan-red, anthracotic possible lymph node. The specimen is bisected and entirely submitted in one cassette. 5. The specimen is received fresh and consists of a 1.3 x 0.9 x 0.7 cm tan-red, anthracotic possible lymph node. The specimen is bisected and entirely submitted in one cassette. (KL:ecj 07/05/2016) 2 of 3 FINAL for [name], [name] ([id number]) Stain(s) used in Diagnosis: The following stain(s) were used in diagnosing the case: ER - NOACIS, P63, CK 5/6, CK-7, CK 20, CDX-2, Thyroid Transcription Factor -1. The control(s) stained appropriately. Disclaimer Some of these immunohistochemical stains may have been developed and the performance characteristics determined by [facility]. Some may not have been cleared or approved by the U.S. Food and Drug Administration. The FDA has determined that such clearance or approval is not necessary. This test is used for clinical purposes. It should not be regarded as investigational or for research. This laboratory is certified under the [place] (CLIA-88) as qualified to perform high  complexity clinical laboratory testing. Report signed out from the following location(s) Technical Component was performed at [facility] [place], [place], [place] [address]. CLIA #: [id number], Technical component and interpretation was performed at [facility] [place], [place], [place] [address]. CLIA #: [id number], 3 of History of Presenting Illness: Marcina Kinnison Habermann 56 y.o. female is seen in the office at the request of Dr. Benay Spice. The patient has a significant history of previous malignancy including, Breast and anal cancer.   Anal cancer-left anal margin/anal canal mass, status post a biopsy on 08/09/2012 confirming invasive moderately differentiated squamous cell carcinoma,p16 positive.  -Staging PET scan 08/20/2012-hypermetabolic anal mass, no hypermetabolic metastases  -Initiation of concurrent radiation with cycle 1 of mitomycin C./5-fluorouracil 09/02/2012.  -She completed cycle 2 5-fluorouracil/mitomycin C. beginning 10/01/2012.  -She completed radiation 10/10/2012  Right breast cancer 2009, ER positive, PR positive, HER-2 positive. She is status post neoadjuvant AC x4 cycles followed by Taxol/Herceptin. She underwent a right lumpectomy and sentinel lymph node biopsy 09/22/2008 with no evidence of residual invasive carcinoma and 5 negative sentinel lymph nodes. She then completed right breast radiation. She began tamoxifen 12/15/2008 and completed adjuvant Herceptin 05/25/2009. The tamoxifen was discontinued and she was switched to Arimidex in July 2013  On  follow-up the patient had a CT scan in January and a subsequent PET scan that showed a sub-solid right lower lobe nodule that had increased in density over several years and a new left upper lobe hypermetabolic lesion highly suggestive of malignancy .  The patient is a long-term smoker and continues to intermittently smoke at times of stress .  The patient has  new left  upper lobe lung lesion markedly hypermetabolic that has not been present on previous CT scans, and highly suspicious for malignancy. It's unclear if this is an isolated pulmonary metastasis from breast cancer,  anal cancer or a possible primary lung cancer. It does appear radiographically different and has behaved biologically different than the sub-solid lesion in the right lung. The patient was presented at the multidisciplinary thoracic oncology conference and the GI conference.  After discussion with patient and Dr. Benay Spice plan to proceed with bronchoscopy left video-assisted thoracoscopy wedge resection of the left upper lobe lung nodule possible node dissection. Wrist and options were discussed with the patient and husband in detail. She was admitted on 07/05/2016 in order to undergo the aforementioned surgery.  Brief Hospital Course:  The patient remained afebrile and hemodynamically stable. A line was removed the evening of surgery. Foley was removed on post operative day 2. Chest tube output gradually decreased. There was no air leak from the chest tubes. Daily chest x rays were obtained and remained stable. All chest tubes were removed by 02/**/2017. Patient is ambulating on room air. Patient is tolerating a diet and has had a bowel movement. Wounds are clean and dry. Final chest X ray showed no pntx . Patient is felt surgically stable for discharge today.   Latest Vital Signs: Blood pressure (!) 95/57, pulse 67, temperature 98.3 F (36.8 C), temperature source Oral, resp. rate 18, height '5\' 5"'  (1.651 m), weight 155 lb 13.8 oz (70.7 kg), last menstrual period 04/07/2008, SpO2 91 %.  Physical Exam: General appearance: alert, cooperative and no distress Neurologic: intact Heart: RRR Lungs: Coarse breath sounds Abdomen: Soft, non tender, bowel sounds present Wound: Dressing is clean and dry   Discharge Condition:Stable and discharged to home.  Recent laboratory studies:  Lab  Results  Component Value Date   WBC 7.7 07/07/2016   HGB 11.5 (L) 07/07/2016   HCT 35.8 (L) 07/07/2016   MCV 103.5 (H) 07/07/2016   PLT 192 07/07/2016   Lab Results  Component Value Date   NA 139 07/07/2016   K 3.9 07/07/2016   CL 101 07/07/2016   CO2 31 07/07/2016   CREATININE 0.61 07/07/2016   GLUCOSE 107 (H) 07/07/2016      Diagnostic Studies: Dg Chest 2 View  Result Date: 07/04/2016 CLINICAL DATA:  Preoperative examination prior to left upper llobectomy for a lung nodule. No current chest complaints. History of breast malignancy. EXAM: CHEST  2 VIEW COMPARISON:  PET-CT study of June 22, 2016 and chest CT scan of June 13, 2016 FINDINGS: The lungs are mildly hyperinflated. There is no alveolar infiltrate. There is subtle nodularity inferiorly in the left upper lobe. The heart and pulmonary vascularity are normal. The mediastinum is normal in width. There is no pleural effusion. The trachea is midline. The bony thorax exhibits no acute abnormality. IMPRESSION: COPD. No CHF nor pneumonia. Abnormal nodule inferiorly in the left upper lobe consistent with the consistent with previous PET-CT study. Electronically Signed   By: David  Martinique M.D.   On: 07/04/2016 09:28   Ct Chest W Contrast  Result Date: 06/13/2016 CLINICAL DATA:  Right lower lobe pulmonary nodule. Anal cancer diagnosed 4/14. Radiation therapy. Breast cancer in 2010 with chemotherapy and radiation therapy after lumpectomy. EXAM: CT CHEST WITH CONTRAST TECHNIQUE: Multidetector CT imaging of the chest was performed during intravenous contrast administration. CONTRAST:  24m ISOVUE-300 IOPAMIDOL (ISOVUE-300) INJECTION 61% COMPARISON:  10/24/2013 FINDINGS: Cardiovascular: Aortic and branch  vessel atherosclerosis. Normal heart size, without pericardial effusion. Multivessel coronary artery atherosclerosis. No central pulmonary embolism, on this non-dedicated study. Mediastinum/Nodes: No supraclavicular adenopathy. No mediastinal  adenopathy. Borderline right hilar adenopathy at 11 mm on image 67/series 2 is felt to be similar. Lungs/Pleura: No pleural fluid. Somewhat ill-defined, possibly sub solid pulmonary nodule measures 9 mm in the right upper lobe posteriorly. Significantly enlarged since the prior. Example image 39/ series 3. An irregular right lower lobe nodular density measures on the order of 1.8 x 1.4 cm on image 87/series 2. Appears more well-defined today than on the prior exam, where it measured 1.4 cm. This difference could be secondary to thinner slice collimation today. 3 mm posterior right upper lobe subpleural pulmonary nodule on image 64/ series 3 is unchanged and can be considered benign. Right upper lobe 6 mm ground-glass nodule is felt to be unchanged, including on image 43/series 3. A 4 mm ill-defined posterior right upper lobe nodule on image 45/ series 3 is also felt to be similar, given differences in slice thickness. Sub solid 5 mm left upper lobe pulmonary nodule on image 29/series 3 is unchanged. Vague left upper lobe 5 mm ground-glass nodule on image 73/series 3, likely new. Left lower lobe 10 mm ground-glass nodule on image 92/series 3 is more apparent today than on the prior, where it measured on the order of 5 mm. A posterior left upper lobe relatively well-circumscribed solid nodule measures 11 x 10 mm on image 54/ series 3 and is new. Upper Abdomen: Tiny well-circumscribed low-density liver lesions are likely cysts and were present previously. Normal imaged portions of the spleen, stomach, pancreas, gallbladder, biliary tract, adrenal glands, kidneys. Abdominal aortic atherosclerosis. Musculoskeletal: No axillary or subpectoral adenopathy. No acute osseous abnormality. IMPRESSION: 1. A posterior left upper lobe solid 11 mm nodule is suspicious for metastatic disease versus less likely attack metachronous primary bronchogenic carcinoma. Consider tissue sampling versus further evaluation with PET. 2. Other  solid and sub solid pulmonary nodules are primarily similar. 10 mm left lower lobe and 9 mm right upper lobe sub solid nodules have enlarged. Apparent increase in size of a solid right lower lobe nodule, favored to be due to thinner slice collimation today. 3. No thoracic adenopathy. 4. Age advanced coronary artery atherosclerosis. Recommend assessment of coronary risk factors and consideration of medical therapy. Electronically Signed   By: Abigail Miyamoto M.D.   On: 06/13/2016 14:36   Nm Pet Image Initial (pi) Skull Base To Thigh  Result Date: 06/22/2016 CLINICAL DATA:  Subsequent treatment strategy for breast and anal cancer. New left lung nodule. EXAM: NUCLEAR MEDICINE PET SKULL BASE TO THIGH TECHNIQUE: 7.7 mCi F-18 FDG was injected intravenously. Full-ring PET imaging was performed from the skull base to thigh after the radiotracer. CT data was obtained and used for attenuation correction and anatomic localization. FASTING BLOOD GLUCOSE:  Value: 87 mg/dl COMPARISON:  08/20/2012 FINDINGS: NECK No hypermetabolic lymph nodes in the neck. CHEST Aortic atherosclerosis. Calcification involving the LAD coronary artery. No hypermetabolic mediastinal or hilar nodes. Left upper lobe lung nodule measures 1 cm. This exhibits malignant range FDG uptake within SUV max equal to 7.3, image 25 of series 8. No additional hypermetabolic pulmonary nodules identified. This includes the irregular-shaped 1.4 x 1.8 cm nodular density in the right lower lobe which has an SUV max equal to 1.35, image 41 of series 8. ABDOMEN/PELVIS No abnormal hypermetabolic activity within the liver, pancreas, adrenal glands, or spleen. Left renal calculus. Aortic atherosclerosis. No  hypermetabolic lymph nodes in the abdomen or pelvis. SKELETON No focal hypermetabolic activity to suggest skeletal metastasis. IMPRESSION: 1. Malignant range FDG uptake is associated with the solid 11 mm nodule within the left upper lobe which may represent metastatic  disease or attack hernias primary bronchogenic carcinoma. Correlation with tissue sampling is advised. 2. No additional hypermetabolic pulmonary nodules are identified at this time including the irregularly-shaped nodule within the right lower lobe. 3. Kidney stone. 4. Aortic atherosclerosis and coronary artery calcification. Electronically Signed   By: Kerby Moors M.D.   On: 06/22/2016 09:27   Dg Chest Port 1 View  Result Date: 07/07/2016 CLINICAL DATA:  Recent pneumothorax.  Recent chest tube removal EXAM: PORTABLE CHEST 1 VIEW COMPARISON:  July 05, 2016 FINDINGS: 1 of the chest tubes has been removed from the left side. A second chest tube remains in position. No evident pneumothorax. Central catheter tip is in the superior vena cava, unchanged. There is postoperative change in the left upper lung zone region. There is no edema or consolidation. Heart is upper normal in size with pulmonary vascularity within normal limits. No adenopathy. No evident bone lesions. IMPRESSION: No evident pneumothorax. Tube and catheter positions as described. No edema or consolidation. Stable cardiac silhouette. Electronically Signed   By: Lowella Grip III M.D.   On: 07/07/2016 07:35   Dg Chest Port 1 View  Result Date: 07/05/2016 CLINICAL DATA:  Postop for left thoracotomy EXAM: PORTABLE CHEST 1 VIEW COMPARISON:  07/04/2016 FINDINGS: Cardiomediastinal silhouette is stable. Two left side chest tubes are noted. There are postsurgical changes with some nodular consolidation and suture line in left upper lobe. No pneumothorax. There is some linear atelectasis or scarring right midlung peripheral. No pulmonary edema. No evidence of pneumothorax. Left IJ central line with tip in SVC. IMPRESSION: Two left side chest tubes are noted. There are postsurgical changes with some nodular consolidation and suture line in left upper lobe. No pneumothorax. There is some linear atelectasis or scarring right midlung peripheral. No  pulmonary edema. No evidence of pneumothorax. Left IJ central line with tip in SVC. Electronically Signed   By: Lahoma Crocker M.D.   On: 07/05/2016 12:21       Discharge Instructions    Discharge patient    Complete by:  As directed    Discharge disposition:  01-Home or Self Care   Discharge patient date:  07/08/2016      Discharge Medications: Allergies as of 07/08/2016      Reactions   No Known Allergies       Medication List    TAKE these medications   albuterol 108 (90 Base) MCG/ACT inhaler Commonly known as:  PROVENTIL HFA;VENTOLIN HFA Inhale 2 puffs into the lungs every 6 (six) hours as needed for wheezing or shortness of breath.   anastrozole 1 MG tablet Commonly known as:  ARIMIDEX Take 1 tablet (1 mg total) by mouth daily.   CALCIUM PO Take 1 tablet by mouth daily.   loratadine 10 MG tablet Commonly known as:  CLARITIN Take 10 mg by mouth as needed for allergies.   LORazepam 1 MG tablet Commonly known as:  ATIVAN Take 1 tablet (1 mg total) by mouth every 8 (eight) hours. What changed:  when to take this  reasons to take this   multivitamin with minerals tablet Take 1 tablet by mouth daily.   naproxen sodium 220 MG tablet Commonly known as:  ANAPROX Take 220 mg by mouth 2 (two) times daily as  needed (for pain.).   traMADol 50 MG tablet Commonly known as:  ULTRAM Take 1-2 tablets (50-100 mg total) by mouth every 6 (six) hours as needed (mild pain).       Follow Up Appointments: Follow-up Information    [name], MD Follow up on 07/27/2016.   Specialty:  Cardiothoracic Surgery Why:  PA/LAT CXR to be taken (at [facility] which is in the same building as Dr. Everrett Coombe office) on 07/27/2016 at 1:00 pm ;Appointment time is at 1:30 pm Contact information: [place] [address] [phone]           Signed: Jadene Pierini EPA-C 07/08/2016, 8:25 AM

## 2016-07-07 ENCOUNTER — Inpatient Hospital Stay (HOSPITAL_COMMUNITY): Payer: BLUE CROSS/BLUE SHIELD

## 2016-07-07 LAB — TYPE AND SCREEN
ABO/RH(D): B POS
Antibody Screen: NEGATIVE
Unit division: 0
Unit division: 0

## 2016-07-07 LAB — COMPREHENSIVE METABOLIC PANEL
ALT: 13 U/L — ABNORMAL LOW (ref 14–54)
AST: 21 U/L (ref 15–41)
Albumin: 3 g/dL — ABNORMAL LOW (ref 3.5–5.0)
Alkaline Phosphatase: 41 U/L (ref 38–126)
Anion gap: 7 (ref 5–15)
BUN: 5 mg/dL — ABNORMAL LOW (ref 6–20)
CO2: 31 mmol/L (ref 22–32)
Calcium: 8.7 mg/dL — ABNORMAL LOW (ref 8.9–10.3)
Chloride: 101 mmol/L (ref 101–111)
Creatinine, Ser: 0.61 mg/dL (ref 0.44–1.00)
GFR calc Af Amer: >60 mL/min (ref >60)
GFR calc non Af Amer: >60 mL/min (ref >60)
Glucose, Bld: 107 mg/dL — ABNORMAL HIGH (ref 65–99)
Potassium: 3.9 mmol/L (ref 3.5–5.1)
Sodium: 139 mmol/L (ref 135–145)
Total Bilirubin: 0.8 mg/dL (ref 0.3–1.2)
Total Protein: 5.8 g/dL — ABNORMAL LOW (ref 6.5–8.1)

## 2016-07-07 LAB — CBC
HCT: 35.8 % — ABNORMAL LOW (ref 36.0–46.0)
Hemoglobin: 11.5 g/dL — ABNORMAL LOW (ref 12.0–15.0)
MCH: 33.2 pg (ref 26.0–34.0)
MCHC: 32.1 g/dL (ref 30.0–36.0)
MCV: 103.5 fL — ABNORMAL HIGH (ref 78.0–100.0)
Platelets: 192 10*3/uL (ref 150–400)
RBC: 3.46 MIL/uL — ABNORMAL LOW (ref 3.87–5.11)
RDW: 13.4 % (ref 11.5–15.5)
WBC: 7.7 10*3/uL (ref 4.0–10.5)

## 2016-07-07 LAB — BPAM RBC
Blood Product Expiration Date: 201803202359
Blood Product Expiration Date: 201803222359
ISSUE DATE / TIME: 201802280922
ISSUE DATE / TIME: 201802280922
Unit Type and Rh: 7300
Unit Type and Rh: 7300

## 2016-07-07 NOTE — Progress Notes (Addendum)
      [name]       [place] [address]             [phone]       2 Days Post-Op Procedure(s) (LRB): VIDEO BRONCHOSCOPY (N/A) VIDEO ASSISTED THORACOSCOPY (VATS)/WEDGE RESECTION left upper lobe,  lymph node dissection and placement of OnQ catheter (Left)  Subjective: Patient without complaints this am.  Objective: Vital signs in last 24 hours: Temp:  [98.6 F (37 C)-98.8 F (37.1 C)] 98.7 F (37.1 C) (03/02 0358) Pulse Rate:  [68-81] 71 (03/02 0358) Cardiac Rhythm: Normal sinus rhythm (03/01 2123) Resp:  [16-20] 16 (03/02 0358) BP: (98-123)/(52-57) 102/54 (03/02 0358) SpO2:  [90 %-98 %] 91 % (03/02 0358)     Intake/Output from previous day: 03/01 0701 - 03/02 0700 In: 894.3 [P.O.:240; I.V.:604.3; IV Piggyback:50] Out: 1995 [BVAPO:1410; Chest Tube:70]   Physical Exam:  Cardiovascular: RRR Pulmonary: Clear to auscultation on right;slightly diminished on left Extremities: No lower extremity edema. Wounds: Clean and dry.  No erythema or signs of infection. Chest Tube: to water seal and no air leak  Lab Results: CBC: Recent Labs  07/06/16 0430 07/07/16 0351  WBC 10.5 7.7  HGB 11.8* 11.5*  HCT 35.8* 35.8*  PLT 188 192   BMET:  Recent Labs  07/06/16 0430 07/07/16 0351  NA 140 139  K 4.1 3.9  CL 103 101  CO2 32 31  GLUCOSE 120* 107*  BUN 6 5*  CREATININE 0.58 0.61  CALCIUM 8.4* 8.7*    PT/INR:  Recent Labs  07/04/16 0855  LABPROT 13.2  INR 1.00   ABG:  INR: Will add last result for INR, ABG once components are confirmed Will add last 4 CBG results once components are confirmed  Assessment/Plan:  1. CV - SR in the 70's 2.  Pulmonary - Chest tube with 70 cc of output last 24 hours. Chest tube is to water seal. CXR this am appears stable. Hope to remove chest tube. Check CXR in am. Encourage incentive spirometer. Await final pathology. 3. Stop PCA after chest tube removed. 4. Remove foley and central line 5. Remove On  Q when empty 6. Possible discharge in am ZIMMERMAN,Gina Cervantes MPA-C 07/07/2016,7:21 AM  Chest tube out today Path still pending Poss home tomorrow  I have seen and examined Gina Cervantes and agree with the above assessment  and plan.  [name] MD [facility] [phone] Office [phone] 07/07/2016 7:54 AM  Discussed with pathology: to be signed out Discussed path with patient   Cancer Staging Lung cancer, left  upper lobe ([place]) Staging form: Lung, AJCC 8th Edition - Pathologic stage from 07/07/2016: Stage IA2 (pT1b, pN0, cM0) - Signed by [name], MD on 07/07/2016

## 2016-07-07 NOTE — Progress Notes (Signed)
Fentanyl PCA wasted in sharps. 25m witnessed by JRandol Kern RN.

## 2016-07-07 NOTE — Progress Notes (Signed)
CT, IJ and On-Q pump d/c'd per order and per protocol. Pt educated on need for bedrest. Pt educated to call RN for any signs of leakage. Call bell and phone within reach. Will continue to monitor.

## 2016-07-08 ENCOUNTER — Inpatient Hospital Stay (HOSPITAL_COMMUNITY): Payer: BLUE CROSS/BLUE SHIELD

## 2016-07-08 MED ORDER — TRAMADOL HCL 50 MG PO TABS: 50.0000 mg | ORAL_TABLET | Freq: Four times a day (QID) | ORAL | 0 refills | Status: DC | PRN

## 2016-07-08 NOTE — Progress Notes (Signed)
[address]       [place] [address]             [phone]      3 Days Post-Op Procedure(s) (LRB): VIDEO BRONCHOSCOPY (N/A) VIDEO ASSISTED THORACOSCOPY (VATS)/WEDGE RESECTION left upper lobe,  lymph node dissection and placement of OnQ catheter (Left) Subjective: Feels well, anxious for discharge  Objective: Vital signs in last 24 hours: Temp:  [98.3 F (36.8 C)-98.7 F (37.1 C)] 98.3 F (36.8 C) (03/03 0440) Pulse Rate:  [67-81] 67 (03/03 0440) Cardiac Rhythm: Normal sinus rhythm (03/02 1900) Resp:  [18] 18 (03/03 0440) BP: (95-117)/(51-57) 95/57 (03/03 0440) SpO2:  [91 %-93 %] 91 % (03/03 0440)  Hemodynamic parameters for last 24 hours:    Intake/Output from previous day: 03/02 0701 - 03/03 0700 In: 720 [P.O.:720] Out: 2650 [Urine:2650] Intake/Output this shift: No intake/output data recorded.  General appearance: alert, cooperative and no distress Heart: regular rate and rhythm Lungs: clear to auscultation bilaterally Abdomen: benign Extremities: no edema or calf tenderness Wound: incis healing well  Lab Results:  Recent Labs  07/06/16 0430 07/07/16 0351  WBC 10.5 7.7  HGB 11.8* 11.5*  HCT 35.8* 35.8*  PLT 188 192   BMET:  Recent Labs  07/06/16 0430 07/07/16 0351  NA 140 139  K 4.1 3.9  CL 103 101  CO2 32 31  GLUCOSE 120* 107*  BUN 6 5*  CREATININE 0.58 0.61  CALCIUM 8.4* 8.7*    PT/INR: No results for input(s): LABPROT, INR in the last 72 hours. ABG    Component Value Date/Time   PHART 7.359 07/05/2016 0704   HCO3 29.0 (H) 07/05/2016 0704   O2SAT 90.0 07/05/2016 0704   CBG (last 3)   Recent Labs  07/05/16 1801 07/05/16 2339 07/06/16 0625  GLUCAP 192* 164* 138*    Meds Scheduled Meds: . acetaminophen  1,000 mg Oral Q6H   Or  . acetaminophen (TYLENOL) oral liquid 160 mg/5 mL  1,000 mg Oral Q6H  . anastrozole  1 mg Oral Daily  . bisacodyl  10 mg Oral Daily  . guaiFENesin  600 mg Oral BID  .  levalbuterol  0.63 mg Nebulization TID  . mouth rinse  15 mL Mouth Rinse BID  . multivitamin with minerals  1 tablet Oral Daily  . pantoprazole  40 mg Oral Daily  . senna-docusate  1 tablet Oral QHS   Continuous Infusions: . dextrose 5 % and 0.9% NaCl 10 mL/hr at 07/07/16 0930   PRN Meds:.levalbuterol, loratadine, LORazepam, ondansetron (ZOFRAN) IV, oxyCODONE, potassium chloride (KCL MULTIRUN) 30 mEq in 265 mL IVPB, sodium chloride flush, traMADol  Xrays Dg Chest Port 1 View  Result Date: 07/07/2016 CLINICAL DATA:  Recent pneumothorax.  Recent chest tube removal EXAM: PORTABLE CHEST 1 VIEW COMPARISON:  July 05, 2016 FINDINGS: 1 of the chest tubes has been removed from the left side. A second chest tube remains in position. No evident pneumothorax. Central catheter tip is in the superior vena cava, unchanged. There is postoperative change in the left upper lung zone region. There is no edema or consolidation. Heart is upper normal in size with pulmonary vascularity within normal limits. No adenopathy. No evident bone lesions. IMPRESSION: No evident pneumothorax. Tube and catheter positions as described. No edema or consolidation. Stable cardiac silhouette. Electronically Signed   By: Lowella Grip III M.D.   On: 07/07/2016 07:35    Assessment/Plan: S/P Procedure(s) (LRB): VIDEO BRONCHOSCOPY (N/A) VIDEO ASSISTED  THORACOSCOPY (VATS)/WEDGE RESECTION left upper lobe,  lymph node dissection and placement of OnQ catheter (Left) Plan for discharge: see discharge orders   LOS: 3 days    GOLD,WAYNE E 07/08/2016

## 2016-07-08 NOTE — Progress Notes (Signed)
Order received to discharge patient.  Telemetry monitor removed and CCMD notified.  PIV access removed.  Discharge instructions, follow up, medications and instructions for their use were discussed with patient. 

## 2016-07-10 NOTE — Op Note (Signed)
Gina Cervantes, Gina Cervantes NO.:  [id number]  MEDICAL RECORD NO.:  [id number]  LOCATION:                                 FACILITY:  PHYSICIAN:  [name], MD         DATE OF BIRTH:  DATE OF PROCEDURE:  07/05/2016 DATE OF DISCHARGE:                              OPERATIVE REPORT   PREOPERATIVE DIAGNOSIS:  Left upper lobe lung nodule with history of anal and breast cancer.  PREOPERATIVE DIAGNOSIS:  Left upper lobe lung nodule with history of anal and breast cancer.  SURGICAL PROCEDURE: 1. Bronchoscopy. 2. Left video-assisted thoracoscopy. 3. Wedge resection of left upper lobe mass. 4. Lymph node dissection and placement of On-Q.  SURGEON:  [name], MD.  FIRST ASSISTANT:  Lars Pinks, PA.  BRIEF HISTORY:  The patient is a 56 year old female, who has a previous known history of breast cancer, stage II and a history of anal cancer. She had been followed with a CT scan in 2016 and one in 2018 with questionable slowly enlarging area in the right lung that is ground glass opacity that became slightly more solid, but without much enlargement and on the sagittal views, it is a very plate-like area.  In addition, a new lesions, not previously present, 1 cm solid lesion was new.  PET scan showed this lesion to be hypermetabolic, lesion in the right was not.  After a discussion with Multidisciplinary Thoracic Oncology Conference and GI conference, we recommended to the patient proceeding with resection of the left upper lobe lesion both for treatment and diagnosis as this could represent an isolated metastasis of either breast or anal cancer or a new primary lung cancer.  The risks and options were discussed with the patient.  She had pulmonary function studies, which showed FEV1 of 1.4 with significant improvement with bronchodilators.  Risks and options were discussed with her in detail and she was willing to proceed.  DESCRIPTION OF PROCEDURE:   The patient underwent general endotracheal anesthesia without incident.  A single-lumen endotracheal tube was placed by Dr. Carlota Raspberry.  Through this, after appropriate time-out was performed, a fiberoptic bronchoscope was passed to the subsegmental level in both the left and right tracheobronchial tree without evidence of endobronchial lesions.  The scope was then removed.  The double-lumen endotracheal tube was placed by Anesthesia.  The patient was then turned to the previously marked left side decubitus position with left side up. The left chest was prepped with Betadine and draped in usual sterile manner.  A second time-out was performed.  We then proceeded with placement of a port incision approximately in the mid axillary line, the fourth intercostal space.  We had some difficulty deflating the lung.  A second port was placed slightly lower and more anterior.  Through the upper port site, with a 30-degree scope in lower, we gradually at the lung deflated it and we were able to palpate in the upper lobe where the 1 cm nodule was in the left upper lobe posteriorly.  We then proceeded with performing a wedge resection of the left upper lobe.  The specimen was then removed and submitted in entirety to pathology.  Dr.  Saralyn Pilar confirmed that the margins were negative and the lesion was malignancy, but could not characterize it any further that saying that it had adenosquamous features.  We then proceeded with lymph node dissection, ascending lymph nodes from 10 L, 11 L, and 12 L areas.  Each of the lymph nodes were submitted in separate specimen cups for permanent examination.  An On-Q device was then tunneled subpleurally along the posterior ribs.  Two chest tubes were left in place in the remaining port incision, but the specimen had been brought out, although was closed with interrupted 0 Vicryl, running 2-0 Vicryl, a 3-0 subcuticular stitch.  Dermabond was applied.  The patient had no air  leak at the completion of the procedure.  The blood loss was minimal.  She tolerated the procedure without obvious complication and was extubated and transferred to the recovery room for postoperative care.  Sponge and needle count were reported as correct.     [name], MD   ______________________________ [name], MD    EG/MEDQ  D:  07/07/2016  T:  07/08/2016  Job:  [id number]

## 2016-07-11 ENCOUNTER — Telehealth: Payer: Self-pay | Admitting: Physician Assistant

## 2016-07-11 NOTE — Telephone Encounter (Signed)
      [address]       [place] [address]             [phone]    Corley O Bejar [id number]   S/P 1. Bronchoscopy. 2. Left video-assisted thoracoscopy. 3. Wedge resection of left upper lobe mass. 4. Lymph node dissection and placement of On-Q  by Dr. Servando Snare performed on 07/05/2016.  She was discharged home on 07/08/2016.  Pathology: Adenocarcinoma of LUL  Problems/Concerns:  Assessment:  Patient is doing well. She states she is taking it easy for 5-7 days.  Her pain is fairly well controlled with Ultram.  She was instructed to contact the office if concerns or problems develop  Follow up Appointment: with Dr. Servando Snare on 07/27/2016 at 1:30 pm

## 2016-07-19 ENCOUNTER — Ambulatory Visit (HOSPITAL_BASED_OUTPATIENT_CLINIC_OR_DEPARTMENT_OTHER): Payer: BLUE CROSS/BLUE SHIELD | Admitting: Oncology

## 2016-07-19 VITALS — BP 119/69 | HR 69 | Temp 98.6°F | Resp 18 | Wt 161.1 lb

## 2016-07-19 DIAGNOSIS — Z85048 Personal history of other malignant neoplasm of rectum, rectosigmoid junction, and anus: Secondary | ICD-10-CM

## 2016-07-19 DIAGNOSIS — Z853 Personal history of malignant neoplasm of breast: Secondary | ICD-10-CM | POA: Diagnosis not present

## 2016-07-19 DIAGNOSIS — C3412 Malignant neoplasm of upper lobe, left bronchus or lung: Secondary | ICD-10-CM

## 2016-07-19 NOTE — Progress Notes (Signed)
[facility] OFFICE PROGRESS NOTE   Diagnosis: Breast cancer, anal cancer  INTERVAL HISTORY:   Gina Cervantes returns as scheduled. She underwent a wedge resection of the left upper lobe nodule and lymph node dissection on 07/05/2016. The pathology ([id number]) disease revealed invasive well-differentiated adenosquamous carcinoma with negative resection margins. Level X, 11, and 12 lymph nodes were benign. No lymphovascular invasion. The tumor cells returned positive for TTF-1 and cytokeratin 7. The morphology and staining pattern are consistent with a lung primary.  There is a suspicious irregular lesion in the right lower lung on the the CT 06/13/2016 and PET scan 06/22/2016.  She has minimal left chest discomfort. Objective:  Vital signs in last 24 hours:  Blood pressure 119/69, pulse 69, temperature 98.6 F (37 C), temperature source Oral, resp. rate 18, weight 161 lb 1.6 oz (73.1 kg), last menstrual period 04/07/2008, SpO2 97 %.   Resp: Lungs clear bilaterally Cardio: Regular rate and rhythm GI: No hepatomegaly, nontender Vascular: No leg edema  Medications: I have reviewed the patient's current medications.  Assessment/Plan: 1.Anal cancer-left anal margin/anal canal mass, status post a biopsy on 08/09/2012 confirming invasive moderately differentiated squamous cell carcinoma,p16 positive.  -Staging PET scan 08/20/2012-hypermetabolic anal mass, no hypermetabolic metastases  -Initiation of concurrent radiation with cycle 1 of mitomycin C./5-fluorouracil 09/02/2012.  -She completed cycle 2 5-fluorouracil/mitomycin C. beginning 10/01/2012.  -She completed radiation 10/10/2012.  2. Right breast cancer 2009, ER positive, PR positive, HER-2 positive. She is status post neoadjuvant AC x4 cycles followed by Taxol/Herceptin. She underwent a right lumpectomy and sentinel lymph node biopsy 09/22/2008 with no evidence of residual invasive carcinoma and 5 negative sentinel  lymph nodes. She then completed right breast radiation. She began tamoxifen 12/15/2008 and completed adjuvant Herceptin 05/25/2009. The tamoxifen was discontinued and she was switched to Arimidex in July 2013.  3. History of enlargement of the right tonsil.  4. Nonspecific pulmonary nodules without hypermetabolic activity on the PET scan 08/20/2012. Chest CT 10/21/2012 with scattered pulmonary nodules including nodules that were not present in 2010. Annual surveillance was recommended. Follow-up chest CT 10/24/2013 with scattered groundglass densities again noted in both lungs mostly unchanged when compared to the previous study. A sub-solid right lower lobe nodule had increased central density. The size was unchanged.The nodule has been present since 2009.  CT chest 06/13/2016-a sub-solid 9 mm right upper lobe nodule has enlarged compared to 2015, a right lower lobe irregular nodular density appears more well defined, new vague 5 mm groundglass left upper lobe nodule, left lower lobe 10 mm groundglass nodule is more apparent, posterior left upper lobe circumscribed solid nodule measures 11 x 10 mm and is new  PET scan 06/22/2016-malignant range activity involving the 11 mm left upper lobe nodule, no other hypermetabolic nodules, suspicious sub-solid right lower lobe nodule  Wedge resection of a hypermetabolic left upper lobe nodule on 07/05/2016-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 The pathology is consistent with a primary lung cancer. She had a stage I tumor. There is no indication for adjuvant therapy. She understands the irregular nodule in the right lower lobe may represent another lung primary. Treatment options include resection and SRS. She would like to wait on a follow-up chest CT until after June. I will scheduled her for a  restaging chest CT and office visit in July.    Gina Coder, MD  07/19/2016  11:45 AM

## 2016-07-19 NOTE — Addendum Note (Signed)
Addended by: Betsy Coder B on: 07/19/2016 12:26 PM   Modules accepted: Level of Service

## 2016-07-25 ENCOUNTER — Encounter (INDEPENDENT_AMBULATORY_CARE_PROVIDER_SITE_OTHER): Payer: Self-pay

## 2016-07-25 DIAGNOSIS — C3412 Malignant neoplasm of upper lobe, left bronchus or lung: Secondary | ICD-10-CM

## 2016-07-25 DIAGNOSIS — Z4802 Encounter for removal of sutures: Secondary | ICD-10-CM

## 2016-07-27 ENCOUNTER — Ambulatory Visit: Payer: Self-pay | Admitting: Cardiothoracic Surgery

## 2016-07-28 ENCOUNTER — Other Ambulatory Visit: Payer: Self-pay | Admitting: Cardiothoracic Surgery

## 2016-07-28 DIAGNOSIS — C3490 Malignant neoplasm of unspecified part of unspecified bronchus or lung: Secondary | ICD-10-CM

## 2016-08-03 ENCOUNTER — Ambulatory Visit (INDEPENDENT_AMBULATORY_CARE_PROVIDER_SITE_OTHER): Payer: Self-pay | Admitting: Cardiothoracic Surgery

## 2016-08-03 ENCOUNTER — Ambulatory Visit
Admission: RE | Admit: 2016-08-03 | Discharge: 2016-08-03 | Disposition: A | Discharge status: Home / Self Care | Payer: BLUE CROSS/BLUE SHIELD | Source: Ambulatory Visit | Attending: Cardiothoracic Surgery | Admitting: Cardiothoracic Surgery

## 2016-08-03 ENCOUNTER — Encounter: Payer: Self-pay | Admitting: Cardiothoracic Surgery

## 2016-08-03 VITALS — BP 108/63 | HR 79 | Resp 16 | Ht 65.0 in | Wt 160.0 lb

## 2016-08-03 DIAGNOSIS — J90 Pleural effusion, not elsewhere classified: Secondary | ICD-10-CM | POA: Diagnosis not present

## 2016-08-03 DIAGNOSIS — C3490 Malignant neoplasm of unspecified part of unspecified bronchus or lung: Secondary | ICD-10-CM

## 2016-08-03 DIAGNOSIS — Z853 Personal history of malignant neoplasm of breast: Secondary | ICD-10-CM

## 2016-08-03 DIAGNOSIS — Z09 Encounter for follow-up examination after completed treatment for conditions other than malignant neoplasm: Secondary | ICD-10-CM

## 2016-08-03 DIAGNOSIS — C3412 Malignant neoplasm of upper lobe, left bronchus or lung: Secondary | ICD-10-CM

## 2016-08-03 NOTE — Progress Notes (Signed)
[address]       [place] [address]             [phone]      Gina Cervantes [place] Medical Record #[id number] Date of Birth: 02-Apr-1961  Referring: Ladell Pier, MD Primary Care: No PCP Per Patient  Chief Complaint:   POST OP FOLLOW UP Cancer Staging Lung cancer, left  upper lobe [facility]) Staging form: Lung, AJCC 8th Edition - Pathologic stage from 07/07/2016: Stage IA2 (pT1b, pN0, cM0) - Signed by [name], MD on 07/07/2016  07/05/2016   OPERATIVE REPORT PREOPERATIVE DIAGNOSIS:  Left upper lobe lung nodule with history of anal and breast cancer. PREOPERATIVE DIAGNOSIS:  Left upper lobe lung nodule with history of anal and breast cancer. SURGICAL PROCEDURE: 1. Bronchoscopy. 2. Left video-assisted thoracoscopy. 3. Wedge resection of left upper lobe mass. 4. Lymph node dissection and placement of On-Q. SURGEON:  [name], MD.  History of Present Illness:     Patient doing well postoperatively after recent lung resection. She has some paresthesias over the left chest. She has remained off of cigarettes as has her husband.   She had a recent appointment with oncology, will not plan any chemotherapy.    Past Medical History:  Diagnosis Date  . Anal cancer ([place]) 08/09/2012   Squamous cell  . Anxiety    PANIC ATTACKS  . Arthritis   . Breast cancer ([place]) 2009   ER+PR+HER-2+  . Ductal carcinoma ([place]) 02/2008    invasive   . Heart palpitations   . History of radiation therapy 09/02/12-10/10/12   anal 50.4Gy  . History of shingles 10/2014   . HPV (human papilloma virus) anogenital infection    vulvar/ freezing hx  . Lung nodule   . Pneumonia    NO RECENT PROBLEMS  . PONV (postoperative nausea and vomiting)   . Radiation 10/21/08-12/08/08   Right breast 6240 cGy  . Status post chemotherapy 02/2008   Taxol/Herceptin     History  Smoking Status  . Former Smoker  . Years: 8.00  . Types: Cigarettes  . Quit date: 06/05/2013    Smokeless Tobacco  . Never Used    Comment: stopped smoking cigarettes Jan. 2018    History  Alcohol Use  . 12.6 oz/week  . 14 Glasses of wine, 7 Shots of liquor per week    Comment: daily      Allergies  Allergen Reactions  . No Known Allergies     Current Outpatient Prescriptions  Medication Sig Dispense Refill  . acetaminophen (TYLENOL) 500 MG chewable tablet Chew 500 mg by mouth every 6 (six) hours as needed for pain.    Marland Kitchen albuterol (PROVENTIL HFA;VENTOLIN HFA) 108 (90 Base) MCG/ACT inhaler Inhale 2 puffs into the lungs every 6 (six) hours as needed for wheezing or shortness of breath. 1 Inhaler 2  . anastrozole (ARIMIDEX) 1 MG tablet Take 1 tablet (1 mg total) by mouth daily. 90 tablet 3  . CALCIUM PO Take 1 tablet by mouth daily.    Marland Kitchen loratadine (CLARITIN) 10 MG tablet Take 10 mg by mouth as needed for allergies.    Marland Kitchen LORazepam (ATIVAN) 1 MG tablet Take 1 tablet (1 mg total) by mouth every 8 (eight) hours. (Patient taking differently: Take 1 mg by mouth every 8 (eight) hours as needed for anxiety. ) 30 tablet 1  . Multiple Vitamins-Minerals (MULTIVITAMIN WITH MINERALS) tablet Take 1 tablet by mouth daily.    Marland Kitchen  naproxen sodium (ANAPROX) 220 MG tablet Take 220 mg by mouth 2 (two) times daily as needed (for pain.).     No current facility-administered medications for this visit.        Physical Exam: Ht '5\' 5"'$  (1.651 m)   Wt 160 lb (72.6 kg)   LMP 04/07/2008   BMI 26.63 kg/m   General appearance: alert and cooperative Neurologic: intact Heart: regular rate and rhythm, S1, S2 normal, no murmur, click, rub or gallop Lungs: clear to auscultation bilaterally Abdomen: soft, non-tender; bowel sounds normal; no masses,  no organomegaly Extremities: extremities normal, atraumatic, no cyanosis or edema and Homans sign is negative, no sign of DVT Wound: Left chest incisions and chest tube sites are well-healed   Diagnostic Studies & Laboratory data:     Recent Radiology  Findings:   Dg Chest 2 View  Result Date: 08/03/2016 CLINICAL DATA:  Status post let wedge resection of left upper lobe mass on July 05, 2016. No current complaints. History of breast, lung, and anal malignancy. EXAM: CHEST  2 VIEW COMPARISON:  Chest x-ray of July 08, 2016 FINDINGS: The right lung is well-expanded and clear. On the left the previously demonstrated pneumothorax has resolved. There is hazy increased density in the region of the surgical bed. Surrounding atelectasis has improved. The mid and lower left lung are clear. There is no pleural effusion. The heart and pulmonary vascularity are normal. The trachea is midline. The bony thorax exhibits no acute abnormality. IMPRESSION: Improved appearance of the left upper lobe and resolution of small left pleural effusion since the previous study. There is a small area of persistent increased density in the upper left lung which may reflect atelectasis or scarring. Electronically Signed   By: David  Martinique M.D.   On: 08/03/2016 11:29  I have independently reviewed the above radiology studies  and reviewed the findings with the patient.   Previous CT of chest   IMPRESSION: 1. A posterior left upper lobe solid 11 mm nodule is suspicious for metastatic disease versus less likely attack metachronous primary bronchogenic carcinoma. Consider tissue sampling versus further evaluation with PET. 2. Other solid and sub solid pulmonary nodules are primarily similar. 10 mm left lower lobe and 9 mm right upper lobe sub solid nodules have enlarged. Apparent increase in size of a solid right lower lobe nodule, favored to be due to thinner slice collimation today. 3. No thoracic adenopathy. 4. Age advanced coronary artery atherosclerosis. Recommend assessment of coronary risk factors and consideration of medical therapy.  Recent Lab Findings: Lab Results  Component Value Date   WBC 7.7 07/07/2016   HGB 11.5 (L) 07/07/2016   HCT 35.8 (L)  07/07/2016   PLT 192 07/07/2016   GLUCOSE 107 (H) 07/07/2016   CHOL 224 (H) 04/07/2016   TRIG 66 04/07/2016   HDL 81 04/07/2016   LDLCALC 130 (H) 04/07/2016   ALT 13 (L) 07/07/2016   AST 21 07/07/2016   NA 139 07/07/2016   K 3.9 07/07/2016   CL 101 07/07/2016   CREATININE 0.61 07/07/2016   BUN 5 (L) 07/07/2016   CO2 31 07/07/2016   TSH 3.173 12/25/2014   INR 1.00 07/04/2016      Assessment / Plan:      Stable after recent resection of left upper lobe lung nodule, which turned out to be a primary stage IA non-small cell cancer of the lung and not metastatic disease from her previous breast cancer or rectal cancer. She will have a  follow-up CT scan approximate 4 months after surgery to evaluate the right lung nodule. This is been scheduled for mid June.        [name] MD      [address] [place] [address] Office [place] [phone]  08/03/2016 11:35 AM

## 2016-08-07 ENCOUNTER — Telehealth: Payer: Self-pay | Admitting: Oncology

## 2016-08-07 NOTE — Telephone Encounter (Signed)
Called patient to inform her of next scheduled appointments.  

## 2016-08-10 ENCOUNTER — Ambulatory Visit: Payer: Self-pay | Admitting: Cardiothoracic Surgery

## 2016-08-16 ENCOUNTER — Telehealth: Payer: Self-pay | Admitting: Oncology

## 2016-08-16 NOTE — Telephone Encounter (Signed)
Spoke with patient confirm ct for 7/10 and GBS 7/11.

## 2016-09-08 ENCOUNTER — Other Ambulatory Visit: Payer: Self-pay | Admitting: Oncology

## 2016-09-08 DIAGNOSIS — Z1231 Encounter for screening mammogram for malignant neoplasm of breast: Secondary | ICD-10-CM

## 2016-09-11 DIAGNOSIS — Z85048 Personal history of other malignant neoplasm of rectum, rectosigmoid junction, and anus: Secondary | ICD-10-CM | POA: Diagnosis not present

## 2016-09-14 ENCOUNTER — Other Ambulatory Visit: Payer: Self-pay

## 2016-10-19 ENCOUNTER — Ambulatory Visit: Payer: Self-pay | Admitting: Cardiothoracic Surgery

## 2016-10-20 ENCOUNTER — Ambulatory Visit
Admission: RE | Admit: 2016-10-20 | Discharge: 2016-10-20 | Disposition: A | Discharge status: Home / Self Care | Payer: BLUE CROSS/BLUE SHIELD | Source: Ambulatory Visit | Attending: Oncology | Admitting: Oncology

## 2016-10-20 DIAGNOSIS — Z1231 Encounter for screening mammogram for malignant neoplasm of breast: Secondary | ICD-10-CM | POA: Diagnosis not present

## 2016-10-20 HISTORY — DX: Personal history of irradiation: Z92.3

## 2016-11-02 ENCOUNTER — Ambulatory Visit: Payer: Self-pay | Admitting: Cardiothoracic Surgery

## 2016-11-10 DIAGNOSIS — H00022 Hordeolum internum right lower eyelid: Secondary | ICD-10-CM | POA: Diagnosis not present

## 2016-11-14 ENCOUNTER — Ambulatory Visit
Admission: RE | Admit: 2016-11-14 | Discharge: 2016-11-14 | Disposition: A | Discharge status: Home / Self Care | Payer: BLUE CROSS/BLUE SHIELD | Source: Ambulatory Visit | Attending: Oncology | Admitting: Oncology

## 2016-11-14 DIAGNOSIS — C3412 Malignant neoplasm of upper lobe, left bronchus or lung: Secondary | ICD-10-CM

## 2016-11-14 DIAGNOSIS — R918 Other nonspecific abnormal finding of lung field: Secondary | ICD-10-CM | POA: Diagnosis not present

## 2016-11-14 MED ORDER — IOPAMIDOL (ISOVUE-300) INJECTION 61%
75.0000 mL | Freq: Once | INTRAVENOUS | Status: AC | PRN
  Administered 2016-11-14: 75 mL via INTRAVENOUS

## 2016-11-15 ENCOUNTER — Ambulatory Visit (HOSPITAL_BASED_OUTPATIENT_CLINIC_OR_DEPARTMENT_OTHER): Payer: BLUE CROSS/BLUE SHIELD | Admitting: Oncology

## 2016-11-15 VITALS — BP 139/62 | HR 87 | Temp 98.5°F | Resp 19 | Ht 65.0 in | Wt 167.7 lb

## 2016-11-15 DIAGNOSIS — Z85048 Personal history of other malignant neoplasm of rectum, rectosigmoid junction, and anus: Secondary | ICD-10-CM

## 2016-11-15 DIAGNOSIS — C3412 Malignant neoplasm of upper lobe, left bronchus or lung: Secondary | ICD-10-CM

## 2016-11-15 DIAGNOSIS — Z853 Personal history of malignant neoplasm of breast: Secondary | ICD-10-CM

## 2016-11-15 DIAGNOSIS — Z85118 Personal history of other malignant neoplasm of bronchus and lung: Secondary | ICD-10-CM

## 2016-11-15 NOTE — Progress Notes (Signed)
[facility] OFFICE PROGRESS NOTE   Diagnosis: Breast cancer, lung cancer, anal cancer  INTERVAL HISTORY:   Gina Cervantes returns as scheduled. She feels well. She is no longer smoking. She has discomfort at the left anterolateral chest wall following the thoracotomy procedure. A bilateral mammogram 10/20/2016 was negative.  Objective:  Vital signs in last 24 hours:  Blood pressure 139/62, pulse 87, temperature 98.5 F (36.9 C), temperature source Oral, resp. rate 19, height _0  (1.651 m), weight 167 lb 11.2 oz (76.1 kg), last menstrual period 04/07/2008, SpO2 95 %.    HEENT: Neck without mass Lymphatics: No cervical, supraclavicular, axillary, or inguinal nodes Resp: Lungs clear bilaterally Cardio: Regular rate and rhythm GI: No hepatomegaly, nontender, no mass Vascular: No leg edema Breasts: Status post right lumpectomy. No evidence for local tumor recurrence. Left breast without mass.   Imaging:  Ct Chest W Contrast  Result Date: 11/14/2016 CLINICAL DATA:  History of left lung cancer, follow-up right lower lobe nodule. History of right breast cancer and anal cancer. EXAM: CT CHEST WITH CONTRAST TECHNIQUE: Multidetector CT imaging of the chest was performed during intravenous contrast administration. CONTRAST:  19m ISOVUE-300 IOPAMIDOL (ISOVUE-300) INJECTION 61% COMPARISON:  PET-CT dated 06/22/2016 FINDINGS: Cardiovascular: Heart is normal in size.  No pericardial effusion. Coronary atherosclerosis of the LAD. Atherosclerotic calcifications of the aortic arch. No evidence of thoracic aortic aneurysm. Mediastinum/Nodes: Small mediastinal lymph nodes, including a 9 mm short axis low right paratracheal node with preservation of the normal fatty hilum. No suspicious axillary lymphadenopathy. Visualized thyroid is unremarkable. Lungs/Pleura: Mild biapical pleural-parenchymal scarring. Postsurgical changes related to prior left upper lobe wedge resection. 14 x 16 mm  irregular nodular opacity in the anterior right lower lobe (series 4/image 78), non FDG avid. Additional scattered nodules in the right lung measuring up to 9 mm in the medial right upper lobe (series 4/ image 39), non FDG avid although possibly beneath the size threshold for PET sensitivity. No focal consolidation. No pleural effusion or pneumothorax. Upper Abdomen: Visualized upper abdomen is unremarkable. Musculoskeletal: Visualized osseous structures are within normal limits. IMPRESSION: Status post left upper lobe wedge resection. Stable 14 x 16 mm irregular nodular opacity in the anterior right lower lobe, non FDG avid on prior PET. Additional scattered right lung nodules measuring up to 9 mm in the medial right upper lobe, unchanged. Aortic Atherosclerosis (ICD10-I70.0). Electronically Signed   By: SJulian HyM.D.   On: 11/14/2016 15:06    Medications: I have reviewed the patient's current medications.  Assessment/Plan: 1.Anal cancer-left anal margin/anal canal mass, status post a biopsy on 08/09/2012 confirming invasive moderately differentiated squamous cell carcinoma,p16 positive.  -Staging PET scan 08/20/2012-hypermetabolic anal mass, no hypermetabolic metastases  -Initiation of concurrent radiation with cycle 1 of mitomycin C./5-fluorouracil 09/02/2012.  -She completed cycle 2 5-fluorouracil/mitomycin C. beginning 10/01/2012.  -She completed radiation 10/10/2012.  2. Right breast cancer 2009, ER positive, PR positive, HER-2 positive. She is status post neoadjuvant AC x4 cycles followed by Taxol/Herceptin. She underwent a right lumpectomy and sentinel lymph node biopsy 09/22/2008 with no evidence of residual invasive carcinoma and 5 negative sentinel lymph nodes. She then completed right breast radiation. She began tamoxifen 12/15/2008 and completed adjuvant Herceptin 05/25/2009. The tamoxifen was discontinued and she was switched to Arimidex in July 2013.  3. History of  enlargement of the right tonsil.  4. Nonspecific pulmonary nodules without hypermetabolic activity on the PET scan 08/20/2012. Chest CT 10/21/2012 with scattered pulmonary nodules including nodules that  were not present in 2010. Annual surveillance was recommended. Follow-up chest CT 10/24/2013 with scattered groundglass densities again noted in both lungs mostly unchanged when compared to the previous study. A sub-solid right lower lobe nodule had increased central density. The size was unchanged.The nodule has been present since 2009.  CT chest 06/13/2016-a sub-solid 9 mm right upper lobe nodule has enlarged compared to 2015, a right lower lobe irregular nodular density appears more well defined, new vague 5 mm groundglass left upper lobe nodule, left lower lobe 10 mm groundglass nodule is more apparent, posterior left upper lobe circumscribed solid nodule measures 11 x 10 mm and is new  PET scan 06/22/2016-malignant range activity involving the 11 mm left upper lobe nodule, no other hypermetabolic nodules, suspicious sub-solid right lower lobe nodule  Wedge resection of a hypermetabolic left upper lobe nodule on 07/05/2016-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 I reviewed the chest CT images with her today. She is scheduled to see Dr. Servando Snare next week. She was noted to have coronary atherosclerosis on the chest CT. She is concerned of the possibility of coronary artery disease. She has intermittent episodes of dyspnea and chest tightness. I made a referral to cardiology. She has been maintained on arimidex since July 2013. We discussed the small disease-free survival benefit confirmed with continuing aromatase inhibitor therapy beyond 5  years. We discussed the risk/benefit of continuing arimidex. She will continue for 2 more years. I recommended she take calcium and vitamin D while on aromatase inhibitor therapy.  She will be scheduled for a restaging chest CT in late December. She will return for an office visit a few days after the CT.  25 minutes were spent with the patient today. The majority of the time was used for counseling and coordination of care.  Donneta Romberg, MD  11/15/2016  10:53 AM

## 2016-11-23 ENCOUNTER — Encounter: Payer: Self-pay | Admitting: Cardiothoracic Surgery

## 2016-11-23 ENCOUNTER — Ambulatory Visit (INDEPENDENT_AMBULATORY_CARE_PROVIDER_SITE_OTHER): Payer: BLUE CROSS/BLUE SHIELD | Admitting: Cardiothoracic Surgery

## 2016-11-23 VITALS — BP 109/54 | HR 85 | Resp 16 | Ht 65.0 in

## 2016-11-23 DIAGNOSIS — Z09 Encounter for follow-up examination after completed treatment for conditions other than malignant neoplasm: Secondary | ICD-10-CM | POA: Diagnosis not present

## 2016-11-23 DIAGNOSIS — C3412 Malignant neoplasm of upper lobe, left bronchus or lung: Secondary | ICD-10-CM | POA: Diagnosis not present

## 2016-11-23 DIAGNOSIS — Z853 Personal history of malignant neoplasm of breast: Secondary | ICD-10-CM

## 2016-11-23 NOTE — Progress Notes (Signed)
[address]       [place] [address]             [phone]      Gina Cervantes [facility] Medical Record #[id number] Date of Birth: Nov 01, 1960  Referring: Ladell Pier, MD Primary Care: Patient, No Pcp Per  Chief Complaint:   POST OP FOLLOW UP Cancer Staging Lung cancer, left  upper lobe [facility]) Staging form: Lung, AJCC 8th Edition - Pathologic stage from 07/07/2016: Stage IA2 (pT1b, pN0, cM0) - Signed by [name], MD on 07/07/2016  07/05/2016   OPERATIVE REPORT PREOPERATIVE DIAGNOSIS:  Left upper lobe lung nodule with history of anal and breast cancer. PREOPERATIVE DIAGNOSIS:  Left upper lobe lung nodule with history of anal and breast cancer. SURGICAL PROCEDURE: 1. Bronchoscopy. 2. Left video-assisted thoracoscopy. 3. Wedge resection of left upper lobe mass. 4. Lymph node dissection and placement of On-Q. SURGEON:  [name], MD.  History of Present Illness:     Patient doing well postoperatively after recent lung resection. She has remained off cigarettes. Just returned from [place].     Past Medical History:  Diagnosis Date  . Anal cancer ([place]) 08/09/2012   Squamous cell  . Anxiety    PANIC ATTACKS  . Arthritis   . Breast cancer ([place]) 2009   ER+PR+HER-2+  . Ductal carcinoma ([place]) 02/2008    invasive   . Heart palpitations   . History of radiation therapy 09/02/12-10/10/12   anal 50.4Gy  . History of shingles 10/2014   . HPV (human papilloma virus) anogenital infection    vulvar/ freezing hx  . Lung nodule   . Personal history of radiation therapy 2010  . Pneumonia    NO RECENT PROBLEMS  . PONV (postoperative nausea and vomiting)   . Radiation 10/21/08-12/08/08   Right breast 6240 cGy  . Status post chemotherapy 02/2008   Taxol/Herceptin     History  Smoking Status  . Former Smoker  . Years: 8.00  . Types: Cigarettes  . Quit date: 06/05/2013  Smokeless Tobacco  . Never Used    Comment: stopped smoking  cigarettes Jan. 2018    History  Alcohol Use  . 12.6 oz/week  . 14 Glasses of wine, 7 Shots of liquor per week    Comment: daily      Allergies  Allergen Reactions  . No Known Allergies     Current Outpatient Prescriptions  Medication Sig Dispense Refill  . acetaminophen (TYLENOL) 500 MG chewable tablet Chew 500 mg by mouth every 6 (six) hours as needed for pain.    Marland Kitchen albuterol (PROVENTIL HFA;VENTOLIN HFA) 108 (90 Base) MCG/ACT inhaler Inhale 2 puffs into the lungs every 6 (six) hours as needed for wheezing or shortness of breath. 1 Inhaler 2  . anastrozole (ARIMIDEX) 1 MG tablet Take 1 tablet (1 mg total) by mouth daily. 90 tablet 3  . CALCIUM PO Take 1 tablet by mouth daily.    Marland Kitchen loratadine (CLARITIN) 10 MG tablet Take 10 mg by mouth as needed for allergies.    Marland Kitchen LORazepam (ATIVAN) 1 MG tablet Take 1 tablet (1 mg total) by mouth every 8 (eight) hours. (Patient taking differently: Take 1 mg by mouth every 8 (eight) hours as needed for anxiety. ) 30 tablet 1  . Multiple Vitamins-Minerals (MULTIVITAMIN WITH MINERALS) tablet Take 1 tablet by mouth daily.    . naproxen sodium (ANAPROX) 220 MG tablet Take 220 mg by mouth 2 (  two) times daily as needed (for pain.).     No current facility-administered medications for this visit.        Physical Exam: BP (!) 109/54 (BP Location: Left Arm, Patient Position: Sitting, Cuff Size: Large)   Pulse 85   Resp 16   Ht '5\' 5"'  (1.651 m)   LMP 04/07/2008   SpO2 97% Comment: ON RA  General appearance: alert and cooperative Neurologic: intact Heart: regular rate and rhythm, S1, S2 normal, no murmur, click, rub or gallop Lungs: clear to auscultation bilaterally Abdomen: soft, non-tender; bowel sounds normal; no masses,  no organomegaly Extremities: extremities normal, atraumatic, no cyanosis or edema and Homans sign is negative, no sign of DVT Wound: Left chest incisions and chest tube sites are well-healed   Diagnostic Studies & Laboratory  data:     Recent Radiology Findings:  Ct Chest W Contrast  Result Date: 11/14/2016 CLINICAL DATA:  History of left lung cancer, follow-up right lower lobe nodule. History of right breast cancer and anal cancer. EXAM: CT CHEST WITH CONTRAST TECHNIQUE: Multidetector CT imaging of the chest was performed during intravenous contrast administration. CONTRAST:  15m ISOVUE-300 IOPAMIDOL (ISOVUE-300) INJECTION 61% COMPARISON:  PET-CT dated 06/22/2016 FINDINGS: Cardiovascular: Heart is normal in size.  No pericardial effusion. Coronary atherosclerosis of the LAD. Atherosclerotic calcifications of the aortic arch. No evidence of thoracic aortic aneurysm. Mediastinum/Nodes: Small mediastinal lymph nodes, including a 9 mm short axis low right paratracheal node with preservation of the normal fatty hilum. No suspicious axillary lymphadenopathy. Visualized thyroid is unremarkable. Lungs/Pleura: Mild biapical pleural-parenchymal scarring. Postsurgical changes related to prior left upper lobe wedge resection. 14 x 16 mm irregular nodular opacity in the anterior right lower lobe (series 4/image 78), non FDG avid. Additional scattered nodules in the right lung measuring up to 9 mm in the medial right upper lobe (series 4/ image 39), non FDG avid although possibly beneath the size threshold for PET sensitivity. No focal consolidation. No pleural effusion or pneumothorax. Upper Abdomen: Visualized upper abdomen is unremarkable. Musculoskeletal: Visualized osseous structures are within normal limits. IMPRESSION: Status post left upper lobe wedge resection. Stable 14 x 16 mm irregular nodular opacity in the anterior right lower lobe, non FDG avid on prior PET. Additional scattered right lung nodules measuring up to 9 mm in the medial right upper lobe, unchanged. Aortic Atherosclerosis (ICD10-I70.0). Electronically Signed   By: SJulian HyM.D.   On: 11/14/2016 15:06     Previous CT of chest   IMPRESSION: 1. A posterior  left upper lobe solid 11 mm nodule is suspicious for metastatic disease versus less likely attack metachronous primary bronchogenic carcinoma. Consider tissue sampling versus further evaluation with PET. 2. Other solid and sub solid pulmonary nodules are primarily similar. 10 mm left lower lobe and 9 mm right upper lobe sub solid nodules have enlarged. Apparent increase in size of a solid right lower lobe nodule, favored to be due to thinner slice collimation today. 3. No thoracic adenopathy. 4. Age advanced coronary artery atherosclerosis. Recommend assessment of coronary risk factors and consideration of medical therapy.  Recent Lab Findings: Lab Results  Component Value Date   WBC 7.7 07/07/2016   HGB 11.5 (L) 07/07/2016   HCT 35.8 (L) 07/07/2016   PLT 192 07/07/2016   GLUCOSE 107 (H) 07/07/2016   CHOL 224 (H) 04/07/2016   TRIG 66 04/07/2016   HDL 81 04/07/2016   LDLCALC 130 (H) 04/07/2016   ALT 13 (L) 07/07/2016   AST 21 07/07/2016  NA 139 07/07/2016   K 3.9 07/07/2016   CL 101 07/07/2016   CREATININE 0.61 07/07/2016   BUN 5 (L) 07/07/2016   CO2 31 07/07/2016   TSH 3.173 12/25/2014   INR 1.00 07/04/2016      Assessment / Plan:      Stable after resection of left upper lobe lung nodule, primary stage Ia non-small cell carcinoma.  Patient continues to be followed with every six-month CT scans of the chest at the oncology center. Patient did not want to duplicate care and will only return as needed.     [name] MD      [address] [place] [address] Office [phone]   Beeper [phone]  11/23/2016 3:13 PM

## 2016-12-14 DIAGNOSIS — H5213 Myopia, bilateral: Secondary | ICD-10-CM | POA: Diagnosis not present

## 2016-12-14 DIAGNOSIS — H524 Presbyopia: Secondary | ICD-10-CM | POA: Diagnosis not present

## 2017-01-14 DIAGNOSIS — M25531 Pain in right wrist: Secondary | ICD-10-CM | POA: Diagnosis not present

## 2017-01-14 DIAGNOSIS — S52501A Unspecified fracture of the lower end of right radius, initial encounter for closed fracture: Secondary | ICD-10-CM | POA: Diagnosis not present

## 2017-01-14 DIAGNOSIS — S6991XA Unspecified injury of right wrist, hand and finger(s), initial encounter: Secondary | ICD-10-CM | POA: Diagnosis not present

## 2017-01-14 DIAGNOSIS — S5291XA Unspecified fracture of right forearm, initial encounter for closed fracture: Secondary | ICD-10-CM | POA: Diagnosis not present

## 2017-01-22 DIAGNOSIS — S52501D Unspecified fracture of the lower end of right radius, subsequent encounter for closed fracture with routine healing: Secondary | ICD-10-CM | POA: Diagnosis not present

## 2017-02-01 DIAGNOSIS — S52501D Unspecified fracture of the lower end of right radius, subsequent encounter for closed fracture with routine healing: Secondary | ICD-10-CM | POA: Diagnosis not present

## 2017-02-08 DIAGNOSIS — S52501D Unspecified fracture of the lower end of right radius, subsequent encounter for closed fracture with routine healing: Secondary | ICD-10-CM | POA: Diagnosis not present

## 2017-02-22 DIAGNOSIS — S52501D Unspecified fracture of the lower end of right radius, subsequent encounter for closed fracture with routine healing: Secondary | ICD-10-CM | POA: Diagnosis not present

## 2017-03-01 DIAGNOSIS — M25531 Pain in right wrist: Secondary | ICD-10-CM | POA: Diagnosis not present

## 2017-03-01 DIAGNOSIS — M25631 Stiffness of right wrist, not elsewhere classified: Secondary | ICD-10-CM | POA: Diagnosis not present

## 2017-03-01 DIAGNOSIS — M6281 Muscle weakness (generalized): Secondary | ICD-10-CM | POA: Diagnosis not present

## 2017-03-05 DIAGNOSIS — M25531 Pain in right wrist: Secondary | ICD-10-CM | POA: Diagnosis not present

## 2017-03-05 DIAGNOSIS — S52501D Unspecified fracture of the lower end of right radius, subsequent encounter for closed fracture with routine healing: Secondary | ICD-10-CM | POA: Diagnosis not present

## 2017-03-05 DIAGNOSIS — M6281 Muscle weakness (generalized): Secondary | ICD-10-CM | POA: Diagnosis not present

## 2017-03-05 DIAGNOSIS — M25631 Stiffness of right wrist, not elsewhere classified: Secondary | ICD-10-CM | POA: Diagnosis not present

## 2017-03-08 DIAGNOSIS — S52501D Unspecified fracture of the lower end of right radius, subsequent encounter for closed fracture with routine healing: Secondary | ICD-10-CM | POA: Diagnosis not present

## 2017-03-12 DIAGNOSIS — M25631 Stiffness of right wrist, not elsewhere classified: Secondary | ICD-10-CM | POA: Diagnosis not present

## 2017-03-12 DIAGNOSIS — S52501D Unspecified fracture of the lower end of right radius, subsequent encounter for closed fracture with routine healing: Secondary | ICD-10-CM | POA: Diagnosis not present

## 2017-03-12 DIAGNOSIS — M25531 Pain in right wrist: Secondary | ICD-10-CM | POA: Diagnosis not present

## 2017-03-12 DIAGNOSIS — M6281 Muscle weakness (generalized): Secondary | ICD-10-CM | POA: Diagnosis not present

## 2017-03-15 DIAGNOSIS — S52501D Unspecified fracture of the lower end of right radius, subsequent encounter for closed fracture with routine healing: Secondary | ICD-10-CM | POA: Diagnosis not present

## 2017-03-15 DIAGNOSIS — M25631 Stiffness of right wrist, not elsewhere classified: Secondary | ICD-10-CM | POA: Diagnosis not present

## 2017-03-15 DIAGNOSIS — M6281 Muscle weakness (generalized): Secondary | ICD-10-CM | POA: Diagnosis not present

## 2017-03-15 DIAGNOSIS — M25531 Pain in right wrist: Secondary | ICD-10-CM | POA: Diagnosis not present

## 2017-03-21 DIAGNOSIS — S52501D Unspecified fracture of the lower end of right radius, subsequent encounter for closed fracture with routine healing: Secondary | ICD-10-CM | POA: Diagnosis not present

## 2017-03-21 DIAGNOSIS — M25631 Stiffness of right wrist, not elsewhere classified: Secondary | ICD-10-CM | POA: Diagnosis not present

## 2017-03-21 DIAGNOSIS — M6281 Muscle weakness (generalized): Secondary | ICD-10-CM | POA: Diagnosis not present

## 2017-03-21 DIAGNOSIS — M25531 Pain in right wrist: Secondary | ICD-10-CM | POA: Diagnosis not present

## 2017-04-10 ENCOUNTER — Telehealth: Payer: Self-pay | Admitting: Oncology

## 2017-04-10 NOTE — Telephone Encounter (Signed)
Pal moved from 12/27 to 1/7 patient aware

## 2017-05-02 ENCOUNTER — Ambulatory Visit
Admission: RE | Admit: 2017-05-02 | Discharge: 2017-05-02 | Disposition: A | Discharge status: Home / Self Care | Payer: BLUE CROSS/BLUE SHIELD | Source: Ambulatory Visit | Attending: Oncology | Admitting: Oncology

## 2017-05-02 DIAGNOSIS — R918 Other nonspecific abnormal finding of lung field: Secondary | ICD-10-CM | POA: Diagnosis not present

## 2017-05-02 DIAGNOSIS — C3412 Malignant neoplasm of upper lobe, left bronchus or lung: Secondary | ICD-10-CM

## 2017-05-02 MED ORDER — IOPAMIDOL (ISOVUE-300) INJECTION 61%
75.0000 mL | Freq: Once | INTRAVENOUS | Status: AC | PRN
  Administered 2017-05-02: 75 mL via INTRAVENOUS

## 2017-05-03 ENCOUNTER — Telehealth: Payer: Self-pay | Admitting: Emergency Medicine

## 2017-05-03 ENCOUNTER — Ambulatory Visit: Payer: Self-pay | Admitting: Oncology

## 2017-05-03 NOTE — Telephone Encounter (Addendum)
Left VM for pt to call back regarding this note.   ----- Message from Ladell Pier, MD sent at 05/02/2017  6:25 PM EST ----- Please call patient, CT stable, no evidence of cancer, f/u a scheduled

## 2017-05-07 ENCOUNTER — Telehealth: Payer: Self-pay

## 2017-05-07 NOTE — Telephone Encounter (Signed)
Returned patient call to report message from [facility] below. Patient voiced understanding and will follow up as scheduled.

## 2017-05-14 ENCOUNTER — Inpatient Hospital Stay: Payer: BLUE CROSS/BLUE SHIELD | Attending: Oncology | Admitting: Oncology

## 2017-05-14 VITALS — BP 109/72 | HR 51 | Temp 98.3°F | Resp 18 | Ht 65.0 in | Wt 163.9 lb

## 2017-05-14 DIAGNOSIS — R590 Localized enlarged lymph nodes: Secondary | ICD-10-CM

## 2017-05-14 DIAGNOSIS — Z85048 Personal history of other malignant neoplasm of rectum, rectosigmoid junction, and anus: Secondary | ICD-10-CM | POA: Diagnosis not present

## 2017-05-14 DIAGNOSIS — Z170 Estrogen receptor positive status [ER+]: Secondary | ICD-10-CM | POA: Insufficient documentation

## 2017-05-14 DIAGNOSIS — Z923 Personal history of irradiation: Secondary | ICD-10-CM | POA: Diagnosis not present

## 2017-05-14 DIAGNOSIS — Z79899 Other long term (current) drug therapy: Secondary | ICD-10-CM | POA: Insufficient documentation

## 2017-05-14 DIAGNOSIS — C50911 Malignant neoplasm of unspecified site of right female breast: Secondary | ICD-10-CM | POA: Diagnosis not present

## 2017-05-14 DIAGNOSIS — Z79811 Long term (current) use of aromatase inhibitors: Secondary | ICD-10-CM | POA: Insufficient documentation

## 2017-05-14 DIAGNOSIS — C3412 Malignant neoplasm of upper lobe, left bronchus or lung: Secondary | ICD-10-CM | POA: Diagnosis not present

## 2017-05-14 NOTE — Progress Notes (Signed)
[facility] OFFICE PROGRESS NOTE   Diagnosis: Breast cancer, lung cancer  INTERVAL HISTORY:   Ms. Hackleman returns as scheduled.  She had a recent upper respiratory infection with a cough and sore throat.  This has improved.  No fever or dyspnea.  She otherwise feels well.  She is scheduled to see her gynecologist within the next few months.  She continues Arimidex.  Objective:  Vital signs in last 24 hours:  Blood pressure 109/72, pulse (!) 51, temperature 98.3 F (36.8 C), temperature source Oral, resp. rate 18, height _0  (1.651 m), weight 163 lb 14.4 oz (74.3 kg), last menstrual period 04/07/2008, SpO2 97 %.    HEENT: Neck without mass Lymphatics: No cervical, supraclavicular, axillary nodes, or inguinal Resp: Clear bilaterally, no respiratory distress Cardio: Regular rate and rhythm GI: No hepatomegaly, no mass, nontender Vascular: No leg edema She declined breast and rectal examinations  Imaging: CT images 05/02/2017-reviewed   Medications: I have reviewed the patient's current medications.   Assessment/Plan:  1.Anal cancer-left anal margin/anal canal mass, status post a biopsy on 08/09/2012 confirming invasive moderately differentiated squamous cell carcinoma,p16 positive.  -Staging PET scan 08/20/2012-hypermetabolic anal mass, no hypermetabolic metastases  -Initiation of concurrent radiation with cycle 1 of mitomycin C./5-fluorouracil 09/02/2012.  -She completed cycle 2 5-fluorouracil/mitomycin C. beginning 10/01/2012.  -She completed radiation 10/10/2012.  2. Right breast cancer 2009, ER positive, PR positive, HER-2 positive. She is status post neoadjuvant AC x4 cycles followed by Taxol/Herceptin. She underwent a right lumpectomy and sentinel lymph node biopsy 09/22/2008 with no evidence of residual invasive carcinoma and 5 negative sentinel lymph nodes. She then completed right breast radiation. She began tamoxifen 12/15/2008 and completed  adjuvant Herceptin 05/25/2009. The tamoxifen was discontinued and she was switched to Arimidex in July 2013. Arimidex discontinued at the end of June 2019. 3. History of enlargement of the right tonsil.  4. Nonspecific pulmonary nodules without hypermetabolic activity on the PET scan 08/20/2012. Chest CT 10/21/2012 with scattered pulmonary nodules including nodules that were not present in 2010. Annual surveillance was recommended. Follow-up chest CT 10/24/2013 with scattered groundglass densities again noted in both lungs mostly unchanged when compared to the previous study. A sub-solid right lower lobe nodule had increased central density. The size was unchanged.The nodule has been present since 2009.  CT chest 06/13/2016-a sub-solid 9 mm right upper lobe nodule has enlarged compared to 2015, a right lower lobe irregular nodular density appears more well defined, new vague 5 mm groundglass left upper lobe nodule, left lower lobe 10 mm groundglass nodule is more apparent, posterior left upper lobe circumscribed solid nodule measures 11 x 10 mm and is new  PET scan 06/22/2016-malignant range activity involving the 11 mm left upper lobe nodule, no other hypermetabolic nodules, suspicious sub-solid right lower lobe nodule  Wedge resection of a hypermetabolic left upper lobe nodule on 07/05/2016-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 Merendino remains in clinical remission from breast, anal, and lung cancer.  She will continue Arimidex until the end of June 2019.  She will completed almost 9 years of adjuvant hormonal therapy.  She declines breast and anal examinations today.  She reports these areas will be  examined by her gynecologist in March.  She has stable inflammatory appearing lung nodules on  chest CT.  She will be scheduled for an office visit and repeat chest CT in 8 months.  15 minutes were spent with the patient today.  The majority of the time was used for counseling and coordination of care.  Betsy Coder, MD  05/14/2017  1:03 PM

## 2017-05-16 ENCOUNTER — Telehealth: Payer: Self-pay | Admitting: Oncology

## 2017-05-16 NOTE — Telephone Encounter (Signed)
Scheduled appt per 1/7 los - lab /CT/ follow up in August 2019 . Sent reminder letter in the mail . Central radiology to contact patient with ct scan .

## 2017-06-19 DIAGNOSIS — J929 Pleural plaque without asbestos: Secondary | ICD-10-CM | POA: Diagnosis not present

## 2017-06-19 DIAGNOSIS — J439 Emphysema, unspecified: Secondary | ICD-10-CM | POA: Diagnosis not present

## 2017-07-19 ENCOUNTER — Ambulatory Visit: Payer: BLUE CROSS/BLUE SHIELD | Admitting: Obstetrics & Gynecology

## 2017-07-19 ENCOUNTER — Other Ambulatory Visit (HOSPITAL_COMMUNITY)
Admission: RE | Admit: 2017-07-19 | Discharge: 2017-07-19 | Disposition: A | Discharge status: Home / Self Care | Payer: BLUE CROSS/BLUE SHIELD | Source: Ambulatory Visit | Attending: Obstetrics & Gynecology | Admitting: Obstetrics & Gynecology

## 2017-07-19 ENCOUNTER — Encounter: Payer: Self-pay | Admitting: Obstetrics & Gynecology

## 2017-07-19 ENCOUNTER — Other Ambulatory Visit: Payer: Self-pay

## 2017-07-19 VITALS — BP 100/62 | HR 88 | Resp 16 | Ht 64.5 in | Wt 159.0 lb

## 2017-07-19 DIAGNOSIS — Z124 Encounter for screening for malignant neoplasm of cervix: Secondary | ICD-10-CM

## 2017-07-19 DIAGNOSIS — Z01419 Encounter for gynecological examination (general) (routine) without abnormal findings: Secondary | ICD-10-CM | POA: Diagnosis not present

## 2017-07-19 NOTE — Progress Notes (Signed)
57 y.o. [id number] [name] here for annual exam.  Was diagnosed with left lobe lung cancer, invasive moderately differentiated SCC, stage IA.    Has stopped smoking.  Has stopped drinking.  Husband finally started therapy, which has been a really good thing for them.   Patient's last menstrual period was 04/07/2008.          Sexually active: Yes.    The current method of family planning is post menopausal status.    Exercising: Yes.    cardio, light weights, yoga Smoker:  no  Health Maintenance: Pap:  04/07/16 neg    12/22/14 Neg  History of abnormal Pap:  Yes,  MMG:  10/23/16 BIRADS1:neg  Colonoscopy:  12/29/15 Dr. Collene Mares. F/u 4 years  BMD:   12/30/14 Normal  TDaP:  08/2008 Pneumonia vaccine(s):  n/a Shingrix:   No Hep C testing: 04/07/16 Neg  Screening Labs: PCP   reports that she quit smoking about 4 years ago. Her smoking use included cigarettes. She quit after 8.00 years of use. she has never used smokeless tobacco. She reports that she drinks about 12.6 oz of alcohol per week. She reports that she does not use drugs.  Past Medical History:  Diagnosis Date  . Anal cancer ([place]) 08/09/2012   Squamous cell  . Anxiety    PANIC ATTACKS  . Arthritis   . Breast cancer ([place]) 2009   ER+PR+HER-2+  . Ductal carcinoma ([place]) 02/2008    invasive   . Heart palpitations   . History of radiation therapy 09/02/12-10/10/12   anal 50.4Gy  . History of shingles 10/2014   . HPV (human papilloma virus) anogenital infection    vulvar/ freezing hx  . Lung nodule   . Malignant neoplasm of upper lobe of left lung ([place]) 06/2016  . Personal history of radiation therapy 2010  . Pneumonia    NO RECENT PROBLEMS  . PONV (postoperative nausea and vomiting)   . Radiation 10/21/08-12/08/08   Right breast 6240 cGy  . Status post chemotherapy 02/2008   Taxol/Herceptin    Past Surgical History:  Procedure Laterality Date  . BREAST BIOPSY Right 02/04/2008   malignant  . BREAST LUMPECTOMY Right 2010   malignant  . BREAST LUMPECTOMY WITH AXILLARY LYMPH NODE BIOPSY Right 5/10   Dr. Marlou Starks  . CESAREAN SECTION    . EVALUATION UNDER ANESTHESIA WITH ANAL FISTULECTOMY N/A 08/09/2012   Procedure: EXAM UNDER ANESTHESIA AND BIOPSY OF ANAL MASS;  Surgeon: Odis Hollingshead, MD;  Location: [facility];  Service: General;  Laterality: N/A;  . KNEE ARTHROSCOPY     left knee  . PORTACATH PLACEMENT  03/2008   dr. Marlou Starks   . REMOVAL PORTACATH  2011  . VIDEO ASSISTED THORACOSCOPY (VATS)/WEDGE RESECTION Left 07/05/2016   Procedure: VIDEO ASSISTED THORACOSCOPY (VATS)/WEDGE RESECTION left upper lobe,  lymph node dissection and placement of OnQ catheter;  Surgeon: [name], MD;  Location: [facility];  Service: Thoracic;  Laterality: Left;  Marland Kitchen VIDEO BRONCHOSCOPY N/A 07/05/2016   Procedure: VIDEO BRONCHOSCOPY;  Surgeon: [name], MD;  Location: [facility];  Service: Thoracic;  Laterality: N/A;    Current Outpatient Medications  Medication Sig Dispense Refill  . anastrozole (ARIMIDEX) 1 MG tablet Take 1 tablet (1 mg total) by mouth daily. 90 tablet 3  . CALCIUM PO Take 1 tablet by mouth daily.    Marland Kitchen loratadine (CLARITIN) 10 MG tablet Take 10 mg by mouth as needed for allergies.    . Multiple Vitamins-Minerals (MULTIVITAMIN  WITH MINERALS) tablet Take 1 tablet by mouth daily.     No current facility-administered medications for this visit.     Family History  Problem Relation Age of Onset  . Lung cancer Father   . Stroke Mother   . Breast cancer Neg Hx     ROS:  Pertinent items are noted in HPI.  Otherwise, a comprehensive ROS was negative.  Exam:   BP 100/62 (BP Location: Left Arm, Patient Position: Sitting, Cuff Size: Normal)   Pulse 88   Resp 16   Ht 5' 4.5" (1.638 m)   Wt 159 lb (72.1 kg)   LMP 04/07/2008   BMI 26.87 kg/m    Height: 5' 4.5" (163.8 cm)  Ht Readings from Last 3 Encounters:  07/19/17 5' 4.5" (1.638 m)  05/14/17 '5\' 5"'  (1.651 m)  11/23/16 '5\' 5"'  (1.651 m)    General appearance:  alert, cooperative and appears stated age Head: Normocephalic, without obvious abnormality, atraumatic Neck: no adenopathy, supple, symmetrical, trachea midline and thyroid normal to inspection and palpation Lungs: clear to auscultation bilaterally Breasts: normal appearance, no masses or tenderness Heart: regular rate and rhythm Abdomen: soft, non-tender; bowel sounds normal; no masses,  no organomegaly Extremities: extremities normal, atraumatic, no cyanosis or edema Skin: Skin color, texture, turgor normal. No rashes or lesions Lymph nodes: Cervical, supraclavicular, and axillary nodes normal. No abnormal inguinal nodes palpated Neurologic: Grossly normal   Pelvic: External genitalia:  no lesions              Urethra:  normal appearing urethra with no masses, tenderness or lesions              Bartholins and Skenes: normal                 Vagina: normal appearing vagina with normal color and discharge, no lesions              Cervix: no lesions              Pap taken: Yes.   Bimanual Exam:  Uterus:  normal size, contour, position, consistency, mobility, non-tender              Adnexa: normal adnexa and no mass, fullness, tenderness               Rectovaginal: Confirms               Anus:  normal sphincter tone, no lesions  Chaperone was present for exam.  A:  Well Woman with normal exam H/o invasive ductal carcinoma of right breast 2009, s/p lumpectomy/SNB/chemo/radiation.  Took Tamoxifen for 5 years.  Still on Arimidex for 5 additional years. Death of son due to heroine overdose 2014. H/O anal carcinoma.  S/p radiation.   LL breast cancer, s/p resection and negative margins 2/18.  P:   Mammogram guidelines reviewed.  Doing yearly diagnostic.   pap smear and HR HPV obtained today BMD is due.  Will need to have scheduled.  Will be off Arimidex this summer. No lab work obtained today  Have encouraged pt to consider having extended genetic testing.  She wants to talk with Dr.  Benay Spice at next visit. Return annually or prn

## 2017-07-23 LAB — CYTOLOGY - PAP
Diagnosis: NEGATIVE
HPV 16/18/45 genotyping: NEGATIVE
HPV: DETECTED — AB

## 2017-07-24 ENCOUNTER — Telehealth: Payer: Self-pay

## 2017-07-24 MED ORDER — LORAZEPAM 1 MG PO TABS: 1.0000 mg | ORAL_TABLET | Freq: Three times a day (TID) | ORAL | 0 refills | Status: DC

## 2017-07-24 NOTE — Telephone Encounter (Signed)
Spoke with patient. Results given. Patient verbalizes understanding. Form for add on testing for 16/18/45 faxed to [facility]. Aware she will be contacted with results once they return.   Patient is requesting a refill for Ativan 1 mg take every 8 hours as needed for anxiety. Last rx was given on 04/07/2016 by Dr.Miller. H/o invasive ductal carcinoma of right breast 2009, s/p lumpectomy/SNB/chemo/radiation.  Took Tamoxifen for 5 years.  Still on Arimidex for 5 additional years. Death of son due to heroine overdose 2014.H/O anal carcinoma.  S/p radiation.   LL breast cancer, s/p resection and negative margins 2/18.Diagnosed with left lobe lung cancer, invasive moderately differentiated SCC, stage IA. Has started therapy.

## 2017-07-24 NOTE — Telephone Encounter (Signed)
-----   Message from Gina Dom, MD sent at 07/23/2017  4:38 PM EDT ----- Please add HPV 16/18/45 to her pap test  Please let her know negative pap with +hpv, further testing pending. Plan will be based on further HPV testing

## 2017-07-24 NOTE — Telephone Encounter (Signed)
Please call in a limited # of ativan until Dr Sabra Heck returns and make further plans with her. If she is having chronic anxiety and/or depression she may benefit from an SSRI. Please call in ativan 1 mg, 1 po q 8 hours prn, #10, no refills. Dr Sabra Heck will be back in 2 days.

## 2017-07-24 NOTE — Telephone Encounter (Signed)
Rx printed and to [place] for signature.

## 2017-07-24 NOTE — Telephone Encounter (Signed)
Spoke with patient. Advised of message as seen below from [place]. Advised rx has been faxed to [facility] on [address]. Patient verbalizes understanding.  Routing to provider for final review. Patient agreeable to disposition. Will close encounter.

## 2017-07-27 ENCOUNTER — Telehealth: Payer: Self-pay

## 2017-07-27 NOTE — Telephone Encounter (Signed)
Spoke with patient. Advised of testing results as seen below. Patient verbalizes understanding. 08 recall placed.  Notes recorded by Megan Salon, MD on 07/26/2017 at 9:36 AM EDT Please let pt know her pap was negative, HR HPV +, but 16/18/45 negative. Does not need any additional evaluation at this time but will need repeat pap and HR HPV 1 year. 08 recall. Thanks.

## 2017-09-06 ENCOUNTER — Other Ambulatory Visit: Payer: Self-pay | Admitting: Oncology

## 2017-09-06 DIAGNOSIS — Z1231 Encounter for screening mammogram for malignant neoplasm of breast: Secondary | ICD-10-CM

## 2017-09-17 ENCOUNTER — Other Ambulatory Visit: Payer: Self-pay | Admitting: Emergency Medicine

## 2017-09-17 MED ORDER — ANASTROZOLE 1 MG PO TABS: 1.0000 mg | ORAL_TABLET | Freq: Every day | ORAL | 3 refills | Status: DC

## 2017-10-22 ENCOUNTER — Ambulatory Visit
Admission: RE | Admit: 2017-10-22 | Discharge: 2017-10-22 | Disposition: A | Discharge status: Home / Self Care | Payer: BLUE CROSS/BLUE SHIELD | Source: Ambulatory Visit | Attending: Oncology | Admitting: Oncology

## 2017-10-22 DIAGNOSIS — Z1231 Encounter for screening mammogram for malignant neoplasm of breast: Secondary | ICD-10-CM | POA: Diagnosis not present

## 2017-10-22 HISTORY — DX: Personal history of antineoplastic chemotherapy: Z92.21

## 2017-11-29 ENCOUNTER — Ambulatory Visit: Payer: BLUE CROSS/BLUE SHIELD | Admitting: Obstetrics & Gynecology

## 2017-11-29 ENCOUNTER — Encounter: Payer: Self-pay | Admitting: Obstetrics & Gynecology

## 2017-11-29 VITALS — BP 120/68 | HR 92 | Resp 16 | Ht 64.5 in | Wt 158.0 lb

## 2017-11-29 DIAGNOSIS — L723 Sebaceous cyst: Secondary | ICD-10-CM

## 2017-11-29 MED ORDER — LORAZEPAM 1 MG PO TABS
1.0000 mg | ORAL_TABLET | Freq: Three times a day (TID) | ORAL | 0 refills | Status: DC
Start: 2017-11-29 — End: 2018-12-23

## 2017-11-29 NOTE — Progress Notes (Signed)
GYNECOLOGY  VISIT  CC:   Vulvar lump  HPI: 57 y.o. [id number] Married Caucasian female here for vulvar lump x 3 weeks.  This is not painful.  She noticed in the shower.  With prior hx of cancer, any new area is worrisome to her.  Denies vaginal bleeding or discharge.  GYNECOLOGIC HISTORY: Patient's last menstrual period was 04/07/2008. Contraception: post menopausal  Menopausal hormone therapy: none  Patient Active Problem List   Diagnosis Date Noted  . Lung cancer, left  upper lobe ([place]) 07/05/2016  . Anal cancer ([place]) 08/15/2012  . Breast cancer, right ([place]) 06/04/2007    Past Medical History:  Diagnosis Date  . Anal cancer ([place]) 08/09/2012   Squamous cell  . Anxiety    PANIC ATTACKS  . Arthritis   . Breast cancer ([place]) 2009   ER+PR+HER-2+  . Ductal carcinoma ([place]) 02/2008    invasive   . Heart palpitations   . History of radiation therapy 09/02/12-10/10/12   anal 50.4Gy  . History of shingles 10/2014   . HPV (human papilloma virus) anogenital infection    vulvar/ freezing hx  . Lung nodule   . Malignant neoplasm of upper lobe of left lung ([place]) 06/2016  . Personal history of chemotherapy   . Personal history of radiation therapy 2010  . Pneumonia    NO RECENT PROBLEMS  . PONV (postoperative nausea and vomiting)   . Radiation 10/21/08-12/08/08   Right breast 6240 cGy  . Shingles 2017  . Status post chemotherapy 02/2008   Taxol/Herceptin    Past Surgical History:  Procedure Laterality Date  . BREAST BIOPSY Right 02/04/2008   malignant  . BREAST LUMPECTOMY Right 2010   malignant  . BREAST LUMPECTOMY WITH AXILLARY LYMPH NODE BIOPSY Right 5/10   Dr. Marlou Starks  . CESAREAN SECTION    . EVALUATION UNDER ANESTHESIA WITH ANAL FISTULECTOMY N/A 08/09/2012   Procedure: EXAM UNDER ANESTHESIA AND BIOPSY OF ANAL MASS;  Surgeon: Odis Hollingshead, MD;  [facility]: [facility];  Service: General;  Laterality: N/A;  . KNEE ARTHROSCOPY     left knee  . PORTACATH PLACEMENT  03/2008   dr. Marlou Starks    . REMOVAL PORTACATH  2011  . VIDEO ASSISTED THORACOSCOPY (VATS)/WEDGE RESECTION Left 07/05/2016   Procedure: VIDEO ASSISTED THORACOSCOPY (VATS)/WEDGE RESECTION left upper lobe,  lymph node dissection and placement of OnQ catheter;  Surgeon: [name], MD;  Location: [facility];  Service: Thoracic;  Laterality: Left;  Marland Kitchen VIDEO BRONCHOSCOPY N/A 07/05/2016   Procedure: VIDEO BRONCHOSCOPY;  Surgeon: [name], MD;  Location: [facility] OR;  Service: Thoracic;  Laterality: N/A;    MEDS:   Current Outpatient Medications on File Prior to Visit  Medication Sig Dispense Refill  . CALCIUM PO Take 1 tablet by mouth daily.    Marland Kitchen loratadine (CLARITIN) 10 MG tablet Take 10 mg by mouth as needed for allergies.    Marland Kitchen LORazepam (ATIVAN) 1 MG tablet Take 1 tablet (1 mg total) by mouth every 8 (eight) hours. 10 tablet 0  . Multiple Vitamins-Minerals (MULTIVITAMIN WITH MINERALS) tablet Take 1 tablet by mouth daily.     No current facility-administered medications on file prior to visit.     ALLERGIES: No known allergies  Family History  Problem Relation Age of Onset  . Lung cancer Father   . Stroke Mother   . Breast cancer Neg Hx     SH:  Married, non smoker  Review of Systems  Genitourinary:  Vulvar lump Pain with intercourse   All other systems reviewed and are negative.   PHYSICAL EXAMINATION:    BP 120/68 (BP Location: Right Arm, Patient Position: Sitting, Cuff Size: Normal)   Pulse 92   Resp 16   Ht 5' 4.5" (1.638 m)   Wt 158 lb (71.7 kg)   LMP 04/07/2008   BMI 26.70 kg/m     General appearance: alert, cooperative and appears stated age Lymph:  No inguinal LAD  Pelvic: External genitalia: small sebaceous cyst on right labia majora (pt identified this lesion as the area of concern)              Urethra:  normal appearing urethra with no masses, tenderness or lesions              Bartholins and Skenes: normal                  Assessment: Vulvar sebaceous  cyst  Plan: Findings reviewed with pt.  No treatment recommended.  Pt reassured.  She is aware additional lesions can occur and to call with any concerns.

## 2017-12-13 ENCOUNTER — Inpatient Hospital Stay: Payer: BLUE CROSS/BLUE SHIELD | Attending: Oncology

## 2017-12-13 ENCOUNTER — Ambulatory Visit
Admission: RE | Admit: 2017-12-13 | Discharge: 2017-12-13 | Disposition: A | Discharge status: Home / Self Care | Payer: BLUE CROSS/BLUE SHIELD | Source: Ambulatory Visit | Attending: Oncology | Admitting: Oncology

## 2017-12-13 DIAGNOSIS — Z9221 Personal history of antineoplastic chemotherapy: Secondary | ICD-10-CM | POA: Insufficient documentation

## 2017-12-13 DIAGNOSIS — Z87891 Personal history of nicotine dependence: Secondary | ICD-10-CM | POA: Insufficient documentation

## 2017-12-13 DIAGNOSIS — C3412 Malignant neoplasm of upper lobe, left bronchus or lung: Secondary | ICD-10-CM | POA: Insufficient documentation

## 2017-12-13 DIAGNOSIS — Z923 Personal history of irradiation: Secondary | ICD-10-CM | POA: Insufficient documentation

## 2017-12-13 DIAGNOSIS — Z853 Personal history of malignant neoplasm of breast: Secondary | ICD-10-CM | POA: Insufficient documentation

## 2017-12-13 DIAGNOSIS — R911 Solitary pulmonary nodule: Secondary | ICD-10-CM | POA: Insufficient documentation

## 2017-12-13 DIAGNOSIS — C3492 Malignant neoplasm of unspecified part of left bronchus or lung: Secondary | ICD-10-CM | POA: Diagnosis not present

## 2017-12-13 DIAGNOSIS — C3491 Malignant neoplasm of unspecified part of right bronchus or lung: Secondary | ICD-10-CM | POA: Diagnosis not present

## 2017-12-13 DIAGNOSIS — Z85048 Personal history of other malignant neoplasm of rectum, rectosigmoid junction, and anus: Secondary | ICD-10-CM | POA: Insufficient documentation

## 2017-12-13 DIAGNOSIS — Z902 Acquired absence of lung [part of]: Secondary | ICD-10-CM | POA: Insufficient documentation

## 2017-12-13 MED ORDER — IOPAMIDOL (ISOVUE-300) INJECTION 61%
75.0000 mL | Freq: Once | INTRAVENOUS | Status: AC | PRN
  Administered 2017-12-13: 75 mL via INTRAVENOUS

## 2017-12-17 ENCOUNTER — Inpatient Hospital Stay (HOSPITAL_BASED_OUTPATIENT_CLINIC_OR_DEPARTMENT_OTHER): Payer: BLUE CROSS/BLUE SHIELD | Admitting: Oncology

## 2017-12-17 ENCOUNTER — Encounter: Payer: Self-pay | Admitting: Oncology

## 2017-12-17 ENCOUNTER — Telehealth: Payer: Self-pay | Admitting: Oncology

## 2017-12-17 VITALS — BP 102/68 | HR 84 | Temp 98.4°F | Resp 18 | Ht 64.5 in | Wt 159.8 lb

## 2017-12-17 DIAGNOSIS — Z87891 Personal history of nicotine dependence: Secondary | ICD-10-CM

## 2017-12-17 DIAGNOSIS — C3412 Malignant neoplasm of upper lobe, left bronchus or lung: Secondary | ICD-10-CM

## 2017-12-17 DIAGNOSIS — Z923 Personal history of irradiation: Secondary | ICD-10-CM | POA: Diagnosis not present

## 2017-12-17 DIAGNOSIS — Z9221 Personal history of antineoplastic chemotherapy: Secondary | ICD-10-CM | POA: Diagnosis not present

## 2017-12-17 DIAGNOSIS — Z85048 Personal history of other malignant neoplasm of rectum, rectosigmoid junction, and anus: Secondary | ICD-10-CM | POA: Diagnosis not present

## 2017-12-17 DIAGNOSIS — Z853 Personal history of malignant neoplasm of breast: Secondary | ICD-10-CM | POA: Diagnosis not present

## 2017-12-17 DIAGNOSIS — Z902 Acquired absence of lung [part of]: Secondary | ICD-10-CM

## 2017-12-17 DIAGNOSIS — R911 Solitary pulmonary nodule: Secondary | ICD-10-CM

## 2017-12-17 MED ORDER — ALBUTEROL SULFATE HFA 108 (90 BASE) MCG/ACT IN AERS: 2.0000 | INHALATION_SPRAY | Freq: Four times a day (QID) | RESPIRATORY_TRACT | 2 refills | Status: DC | PRN

## 2017-12-17 NOTE — Telephone Encounter (Signed)
Scheduled appt per 8/12 los - pt aware of appts no  Print out wanted per patient request.

## 2017-12-17 NOTE — Progress Notes (Signed)
[facility] OFFICE PROGRESS NOTE   Diagnosis: Non-small cell lung cancer, breast cancer, anal cancer  INTERVAL HISTORY:   Gina Cervantes returns for a scheduled visit.  She completed Arimidex in mid July 2019.  A mammogram was negative on 10/22/2017.  No change over either breast. She feels well.  No difficulty with bowel function. She has exertional dyspnea and chest heaviness when she is working in hot weather.  The symptoms have been relieved with an inhaler.  She is no longer smoking.  Objective:  Vital signs in last 24 hours:  Blood pressure 102/68, pulse 84, temperature 98.4 F (36.9 C), temperature source Oral, resp. rate 18, height 5' 4.5" (1.638 m), weight 159 lb 12.8 oz (72.5 kg), last menstrual period 04/07/2008, SpO2 99 %.    HEENT: Neck without mass Lymphatics: No cervical, supraclavicular, axillary, or inguinal nodes Resp: Lungs clear bilaterally, no respiratory distress Cardio: Regular rate and rhythm GI: No hepatomegaly, no mass, nontender Vascular: No leg edema She declined breast and rectal examinations  Lab Results:   Imaging: CT chest 12/2017-images reviewed with Gina Cervantes  Medications: I have reviewed the patient's current medications.   Assessment/Plan:  1.Anal cancer-left anal margin/anal canal mass, status post a biopsy on 08/09/2012 confirming invasive moderately differentiated squamous cell carcinoma,p16 positive.  -Staging PET scan 08/20/2012-hypermetabolic anal mass, no hypermetabolic metastases  -Initiation of concurrent radiation with cycle 1 of mitomycin C./5-fluorouracil 09/02/2012.  -She completed cycle 2 5-fluorouracil/mitomycin C. beginning 10/01/2012.  -She completed radiation 10/10/2012.  2. Right breast cancer 2009, ER positive, PR positive, HER-2 positive. She is status post neoadjuvant AC x4 cycles followed by Taxol/Herceptin. She underwent a right lumpectomy and sentinel lymph node biopsy 09/22/2008 with no evidence  of residual invasive carcinoma and 5 negative sentinel lymph nodes. She then completed right breast radiation. She began tamoxifen 12/15/2008 and completed adjuvant Herceptin 05/25/2009. The tamoxifen was discontinued and she was switched to Arimidex in July 2013. Arimidex discontinued in mid July 2019. 3. History of enlargement of the right tonsil.  4. Nonspecific pulmonary nodules without hypermetabolic activity on the PET scan 08/20/2012. Chest CT 10/21/2012 with scattered pulmonary nodules including nodules that were not present in 2010. Annual surveillance was recommended. Follow-up chest CT 10/24/2013 with scattered groundglass densities again noted in both lungs mostly unchanged when compared to the previous study. A sub-solid right lower lobe nodule had increased central density. The size was unchanged.The nodule has been present since 2009.  CT chest 06/13/2016-a sub-solid 9 mm right upper lobe nodule has enlarged compared to 2015, a right lower lobe irregular nodular density appears more well defined, new vague 5 mm groundglass left upper lobe nodule, left lower lobe 10 mm groundglass nodule is more apparent, posterior left upper lobe circumscribed solid nodule measures 11 x 10 mm and is new  PET scan 06/22/2016-malignant range activity involving the 11 mm left upper lobe nodule, no other hypermetabolic nodules, suspicious sub-solid right lower lobe nodule  Wedge resection of a hypermetabolic left upper lobe nodule on 07/05/2016-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  A  right upper lobe nodule has increased slightly compared to a CT from December 2018.  She will be referred for a restaging PET scan and return for an office visit in 2-3 weeks.  We will make a referral to Dr. Servando Snare if the lesion is hypermetabolic.  She remains in clinical remission from anal cancer and breast cancer.  She is undergoing breast and anal examinations by her gynecologist.  She has a history of smoking.  I suspect the respiratory symptoms are related to a component of COPD.  She will use an albuterol inhaler as needed.  25 minutes were spent with the patient today.  The majority of the time was used for counseling and coordination of care.  Betsy Coder, MD  12/17/2017  12:34 PM

## 2017-12-17 NOTE — Addendum Note (Signed)
Addended by: Mathis Fare on: 12/17/2017 05:39 PM   Modules accepted: Orders

## 2018-01-09 ENCOUNTER — Ambulatory Visit (HOSPITAL_COMMUNITY)
Admission: RE | Admit: 2018-01-09 | Discharge: 2018-01-09 | Disposition: A | Discharge status: Home / Self Care | Payer: BLUE CROSS/BLUE SHIELD | Source: Ambulatory Visit | Attending: Oncology | Admitting: Oncology

## 2018-01-09 DIAGNOSIS — M4186 Other forms of scoliosis, lumbar region: Secondary | ICD-10-CM | POA: Diagnosis not present

## 2018-01-09 DIAGNOSIS — C3412 Malignant neoplasm of upper lobe, left bronchus or lung: Secondary | ICD-10-CM | POA: Insufficient documentation

## 2018-01-09 DIAGNOSIS — I700 Atherosclerosis of aorta: Secondary | ICD-10-CM | POA: Insufficient documentation

## 2018-01-09 DIAGNOSIS — N200 Calculus of kidney: Secondary | ICD-10-CM | POA: Diagnosis not present

## 2018-01-09 DIAGNOSIS — R911 Solitary pulmonary nodule: Secondary | ICD-10-CM | POA: Insufficient documentation

## 2018-01-09 DIAGNOSIS — I2510 Atherosclerotic heart disease of native coronary artery without angina pectoris: Secondary | ICD-10-CM | POA: Insufficient documentation

## 2018-01-09 DIAGNOSIS — D259 Leiomyoma of uterus, unspecified: Secondary | ICD-10-CM | POA: Insufficient documentation

## 2018-01-09 LAB — GLUCOSE, CAPILLARY: Glucose-Capillary: 115 mg/dL — ABNORMAL HIGH (ref 70–99)

## 2018-01-09 MED ORDER — FLUDEOXYGLUCOSE F - 18 (FDG) INJECTION
7.5600 | Freq: Once | INTRAVENOUS | Status: AC | PRN
  Administered 2018-01-09: 7.56 via INTRAVENOUS

## 2018-01-10 ENCOUNTER — Telehealth: Payer: Self-pay | Admitting: Oncology

## 2018-01-10 ENCOUNTER — Inpatient Hospital Stay: Payer: BLUE CROSS/BLUE SHIELD | Attending: Oncology | Admitting: Oncology

## 2018-01-10 VITALS — BP 98/74 | HR 86 | Temp 98.5°F | Resp 18 | Ht 64.5 in | Wt 154.2 lb

## 2018-01-10 DIAGNOSIS — C3412 Malignant neoplasm of upper lobe, left bronchus or lung: Secondary | ICD-10-CM

## 2018-01-10 DIAGNOSIS — Z87891 Personal history of nicotine dependence: Secondary | ICD-10-CM

## 2018-01-10 DIAGNOSIS — Z85048 Personal history of other malignant neoplasm of rectum, rectosigmoid junction, and anus: Secondary | ICD-10-CM | POA: Diagnosis not present

## 2018-01-10 DIAGNOSIS — Z170 Estrogen receptor positive status [ER+]: Secondary | ICD-10-CM | POA: Diagnosis not present

## 2018-01-10 DIAGNOSIS — C3490 Malignant neoplasm of unspecified part of unspecified bronchus or lung: Secondary | ICD-10-CM

## 2018-01-10 DIAGNOSIS — Z923 Personal history of irradiation: Secondary | ICD-10-CM | POA: Diagnosis not present

## 2018-01-10 DIAGNOSIS — Z853 Personal history of malignant neoplasm of breast: Secondary | ICD-10-CM | POA: Diagnosis not present

## 2018-01-10 DIAGNOSIS — Z9221 Personal history of antineoplastic chemotherapy: Secondary | ICD-10-CM | POA: Diagnosis not present

## 2018-01-10 DIAGNOSIS — Z902 Acquired absence of lung [part of]: Secondary | ICD-10-CM | POA: Diagnosis not present

## 2018-01-10 NOTE — Telephone Encounter (Signed)
Scheduled appt per 9/5 los - f./u in 4 months - patient is aware of appt date and time.

## 2018-01-10 NOTE — Progress Notes (Signed)
[facility] OFFICE PROGRESS NOTE   Diagnosis: Anal cancer, breast cancer, lung cancer  INTERVAL HISTORY:   Ms. Gravette returns as scheduled.  She feels well.  No complaint.  She reports intentional weight loss with dieting.  Objective:  Vital signs in last 24 hours:  Blood pressure 98/74, pulse 86, temperature 98.5 F (36.9 C), temperature source Oral, resp. rate 18, height 5' 4.5" (1.638 m), weight 154 lb 3.2 oz (69.9 kg), last menstrual period 04/07/2008, SpO2 95 %.    Physical examination-not performed today   Imaging:  Nm Pet Image Initial (pi) Skull Base To Thigh  Result Date: 01/09/2018 CLINICAL DATA:  Subsequent treatment strategy for enlarging right upper lobe nodule in this patient with history of anorectal, breast, and lung cancer. EXAM: NUCLEAR MEDICINE PET SKULL BASE TO THIGH TECHNIQUE: 7.6 mCi F-18 FDG was injected intravenously. Full-ring PET imaging was performed from the skull base to thigh after the radiotracer. CT data was obtained and used for attenuation correction and anatomic localization. Fasting blood glucose: 115 mg/dl COMPARISON:  PET-CT dated 06/22/2016 and CT chest from 12/13/2017 FINDINGS: Mediastinal blood pool activity: SUV max 2.1 NECK: Mostly symmetric activity along the tonsillar tissues, minimal asymmetric activity in the lateral portion of the right palatine tonsil with maximum SUV 5.7, contralateral side 3.8. Incidental CT findings: Bilateral common carotid atherosclerotic calcification. CHEST: The dominant 1.5 by 1.2 cm right upper lobe pulmonary nodule has a maximum SUV of 1.8. On the prior PET-CT from 06/22/2016 this nodule measured about 0.8 by 0.9 cm and had a maximum SUV of 1.0. A right lower lobe nodule posterior to the major fissure measures 1.2 cm in short axis and has a maximum SUV of 1.8, on the prior PET-CT this measured 0.9 cm in short axis and with maximum SUV of 1.4. A 0.9 by 0.7 cm right upper lobe pulmonary nodule on image  35/8 has increased in size compared to the prior PET-CT but does not have a demonstrably accentuated metabolic activity. Ground-glass density 1.3 by 0.8 cm nodule in the left lower lobe on image 45/8 is not currently appreciably hypermetabolic. Postoperative findings from wedge resection in the left upper lobe, with resolution of the prior hypermetabolic activity in that vicinity. A lower paratracheal node measuring 1.1 cm in short axis anterior to the carina on image 59/4 has a maximum SUV of 3.6. Previously on the prior PET-CT the maximum SUV was 3.5. Incidental CT findings: Atherosclerotic calcification of the aortic arch and left anterior descending coronary artery. Paraseptal emphysema. ABDOMEN/PELVIS: No significant abnormal hypermetabolic activity in this region. Incidental CT findings: Aortoiliac atherosclerotic vascular disease. 2 mm left kidney lower pole nonobstructive renal calculus. Small lower periaortic and common iliac lymph nodes are not pathologically enlarged or hypermetabolic on today's PET/CT. Small uterine fibroid along the posterior fundus. SKELETON: Stable accentuated activity at the L5-S1 intervertebral level, likely inflammatory. No appreciable osseous metastatic disease. Incidental CT findings: Levoconvex lumbar scoliosis. IMPRESSION: 1. The enlarging 1.5 by 1.2 cm right upper lobe pulmonary nodule has a maximum SUV of 1.8. Back on 06/22/2016 this lesion measured 0.8 by 0.9 cm and had a maximum SUV of 1.0. The enlargement and progression in metabolic activity favors low-grade adenocarcinoma. 2. Similarly, a right lower lobe nodule posterior to the major fissure has mildly enlarged and minimally increased in maximum SUV compared to the prior PET-CT of 06/12/2016, and could likewise represent low-grade adenocarcinoma. 3. A 8 mm right upper lobe pulmonary nodule on image 35/8 has increased in  size compared to the prior PET-CT but does not currently have accentuated metabolic activity.  Surveillance of this lesion and some of the other nodules such as the ground-glass density 1.3 by 0.8 cm non hypermetabolic lesion in the left lower lobe is suggested. 4. Stable activity in stable minimal enlargement of a lower paratracheal node anterior to the carina. 5. No findings of malignancy involving the abdomen/pelvis or skeleton. 6. Other imaging findings of potential clinical significance: Aortic Atherosclerosis (ICD10-I70.0). Coronary atherosclerosis. Nonobstructive left nephrolithiasis. Small uterine fibroids. Chronic inflammatory activity at L5-S1. Levoconvex lumbar scoliosis. Electronically Signed   By: Van Clines M.D.   On: 01/09/2018 16:30    Medications: I have reviewed the patient's current medications.   Assessment/Plan: 1.Anal cancer-left anal margin/anal canal mass, status post a biopsy on 08/09/2012 confirming invasive moderately differentiated squamous cell carcinoma,p16 positive.  -Staging PET scan 08/20/2012-hypermetabolic anal mass, no hypermetabolic metastases  -Initiation of concurrent radiation with cycle 1 of mitomycin C./5-fluorouracil 09/02/2012.  -She completed cycle 2 5-fluorouracil/mitomycin C. beginning 10/01/2012.  -She completed radiation 10/10/2012.  2. Right breast cancer 2009, ER positive, PR positive, HER-2 positive. She is status post neoadjuvant AC x4 cycles followed by Taxol/Herceptin. She underwent a right lumpectomy and sentinel lymph node biopsy 09/22/2008 with no evidence of residual invasive carcinoma and 5 negative sentinel lymph nodes. She then completed right breast radiation. She began tamoxifen 12/15/2008 and completed adjuvant Herceptin 05/25/2009. The tamoxifen was discontinued and she was switched to Arimidex in July 2013. Arimidex discontinued in mid July 2019. 3. History of enlargement of the right tonsil.  4. Nonspecific pulmonary nodules without hypermetabolic activity on the PET scan 08/20/2012. Chest CT 10/21/2012 with  scattered pulmonary nodules including nodules that were not present in 2010. Annual surveillance was recommended. Follow-up chest CT 10/24/2013 with scattered groundglass densities again noted in both lungs mostly unchanged when compared to the previous study. A sub-solid right lower lobe nodule had increased central density. The size was unchanged.The nodule has been present since 2009.  CT chest 06/13/2016-a sub-solid 9 mm right upper lobe nodule has enlarged compared to 2015, a right lower lobe irregular nodular density appears more well defined, new vague 5 mm groundglass left upper lobe nodule, left lower lobe 10 mm groundglass nodule is more apparent, posterior left upper lobe circumscribed solid nodule measures 11 x 10 mm and is new  PET scan 06/22/2016-malignant range activity involving the 11 mm left upper lobe nodule, no other hypermetabolic nodules, suspicious sub-solid right lower lobe nodule  Wedge resection of a hypermetabolic left upper lobe nodule on 07/05/2016-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,pT1,pN0, stage IA, negative resection margins  CT chest 11/14/2016-status post left upper lobe wedge resection, stable right lung nodules  Chest CT 05/02/2017-stable bilateral solid and groundglass nodules, stable mild mediastinal lymphadenopathy  CT chest 12/13/2017- enlargement of right upper lobe nodule, other lung nodules and chest lymph node stable  PET 01/09/2018- right upper lobe nodule and right lower lobe nodule have enlarged since 2018 and have low level metabolic activity increased size of an 8 mm right upper lobe nodule without increased metabolic activity, stable activity and a mildly enlarged lower paratracheal node, no evidence of metastatic disease in the abdomen or pelvis    Disposition: She appears unchanged.  The PET images do not reveal significant metabolic activity associated with any of the lung nodules, but several of the nodules have  enlarged over the past 1-1/2 years.  There is mild metabolic activity associated with a paratracheal  node.  The CT and PET findings likely indicate low-grade adenocarcinomas.  I discussed treatment options with her.  Her case will be presented at the thoracic tumor conference on 01/17/2018.  We will arrange for follow-up with Dr. Servando Snare and Dr. Lisbeth Renshaw pending the conference discussion.  25 minutes were spent with the patient today.  The majority of the time was used for counseling and coordination of care.  Betsy Coder, MD  01/10/2018  10:56 AM

## 2018-01-18 ENCOUNTER — Encounter: Payer: Self-pay | Admitting: *Deleted

## 2018-01-18 ENCOUNTER — Telehealth: Payer: Self-pay | Admitting: *Deleted

## 2018-01-18 DIAGNOSIS — C3412 Malignant neoplasm of upper lobe, left bronchus or lung: Secondary | ICD-10-CM

## 2018-01-18 NOTE — Telephone Encounter (Signed)
Oncology Nurse Navigator Documentation  Oncology Nurse Navigator Flowsheets 01/18/2018  Navigator Location [facility]  Navigator Encounter Type Telephone/I followed up with Dr. Servando Snare and Dr. Benay Spice regarding next steps for Gina Cervantes.  Dr. Servando Snare would like to see her in 3 months with a CT Super D and to see him after that.  I completed vo of ct scan and updated his office on needing a follow up in 3 months. I will also update our scheduling team to call and schedule appt with Dr. Benay Spice in January.  I called patient to update her and explain next steps. She was thankful for the call and update.   Telephone Outgoing Call  Treatment Phase Follow-up  Barriers/Navigation Needs Education;Coordination of Care  Education Other  Interventions Coordination of Care;Education  Coordination of Care Other  Education Method Verbal  Acuity Level 2  Time Spent with Patient 30

## 2018-03-01 ENCOUNTER — Telehealth: Payer: Self-pay | Admitting: *Deleted

## 2018-03-01 NOTE — Telephone Encounter (Signed)
Oncology Nurse Navigator Documentation  Oncology Nurse Navigator Flowsheets 03/01/2018  Navigator Location [facility]  Navigator Encounter Type Telephone/I called central scheduling to get an appt for CT Super D.  I was given a date and time. I called Gina Cervantes to update. I was unable to reach but did leave vm message with appt and pre-procedure instructions.  I also left my name and phone number to call with questions if needed.   Telephone Outgoing Call  Treatment Phase Pre-Tx/Tx Discussion  Barriers/Navigation Needs Education;Coordination of Care  Education Other  Interventions Coordination of Care;Education  Coordination of Care Appts;Other  Education Method Verbal  Acuity Level 2  Time Spent with Patient 30

## 2018-04-17 ENCOUNTER — Telehealth: Payer: Self-pay | Admitting: *Deleted

## 2018-04-17 ENCOUNTER — Telehealth: Payer: Self-pay | Admitting: Oncology

## 2018-04-17 NOTE — Telephone Encounter (Signed)
Oncology Nurse Navigator Documentation  Oncology Nurse Navigator Flowsheets 04/17/2018  Navigator Location [facility]  Navigator Encounter Type Telephone/Ms. Poznanski called and left me a message to call.  I called her. She had questions about her upcoming scan and appt with Dr. Servando Snare.  I explained and she was thankful for the update.   Telephone Incoming Call;Outgoing Call  Treatment Phase Abnormal Scans  Barriers/Navigation Needs Education  Education Other  Interventions Education  Education Method Verbal  Acuity Level 1  Time Spent with Patient 15

## 2018-04-17 NOTE — Telephone Encounter (Signed)
R/S appt due to [facility]BS being on call that afternoon. Left vm for pt re r/s appt.

## 2018-04-22 ENCOUNTER — Ambulatory Visit (HOSPITAL_COMMUNITY)
Admission: RE | Admit: 2018-04-22 | Discharge: 2018-04-22 | Disposition: A | Discharge status: Home / Self Care | Payer: BLUE CROSS/BLUE SHIELD | Source: Ambulatory Visit | Attending: Cardiothoracic Surgery | Admitting: Cardiothoracic Surgery

## 2018-04-22 ENCOUNTER — Encounter (HOSPITAL_COMMUNITY): Payer: Self-pay

## 2018-04-22 DIAGNOSIS — C3412 Malignant neoplasm of upper lobe, left bronchus or lung: Secondary | ICD-10-CM

## 2018-04-22 DIAGNOSIS — J439 Emphysema, unspecified: Secondary | ICD-10-CM | POA: Diagnosis not present

## 2018-04-22 DIAGNOSIS — R918 Other nonspecific abnormal finding of lung field: Secondary | ICD-10-CM | POA: Diagnosis not present

## 2018-04-22 MED ORDER — SODIUM CHLORIDE (PF) 0.9 % IJ SOLN
INTRAMUSCULAR | Status: AC
  Filled 2018-04-22: qty 50

## 2018-04-22 MED ORDER — IOHEXOL 300 MG/ML  SOLN
75.0000 mL | Freq: Once | INTRAMUSCULAR | Status: AC | PRN
  Administered 2018-04-22: 75 mL via INTRAVENOUS

## 2018-04-25 ENCOUNTER — Encounter: Payer: Self-pay | Admitting: Cardiothoracic Surgery

## 2018-04-25 ENCOUNTER — Encounter: Payer: Self-pay | Admitting: *Deleted

## 2018-04-25 ENCOUNTER — Ambulatory Visit (INDEPENDENT_AMBULATORY_CARE_PROVIDER_SITE_OTHER): Payer: BLUE CROSS/BLUE SHIELD | Admitting: Cardiothoracic Surgery

## 2018-04-25 ENCOUNTER — Other Ambulatory Visit: Payer: Self-pay | Admitting: *Deleted

## 2018-04-25 ENCOUNTER — Other Ambulatory Visit: Payer: Self-pay

## 2018-04-25 VITALS — BP 120/82 | HR 92 | Resp 18 | Ht 64.5 in | Wt 147.8 lb

## 2018-04-25 DIAGNOSIS — C3412 Malignant neoplasm of upper lobe, left bronchus or lung: Secondary | ICD-10-CM

## 2018-04-25 DIAGNOSIS — R918 Other nonspecific abnormal finding of lung field: Secondary | ICD-10-CM

## 2018-04-25 NOTE — Progress Notes (Signed)
[address]       [place] [address]             [phone]      Toya O Monty [place] Medical Record #[id number] Date of Birth: Jan 05, 1961  Referring: Ladell Pier, MD Primary Care: Patient, No Pcp Per  Chief Complaint:   POST OP FOLLOW UP Cancer Staging Lung cancer, left  upper lobe [facility]) Staging form: Lung, AJCC 8th Edition - Pathologic stage from 07/07/2016: Stage IA2 (pT1b, pN0, cM0) - Signed by [name], MD on 07/07/2016  07/05/2016   OPERATIVE REPORT PREOPERATIVE DIAGNOSIS:  Left upper lobe lung nodule with history of anal and breast cancer. PREOPERATIVE DIAGNOSIS:  Left upper lobe lung nodule with history of anal and breast cancer. SURGICAL PROCEDURE: 1. Bronchoscopy. 2. Left video-assisted thoracoscopy. 3. Wedge resection of left upper lobe mass. 4. Lymph node dissection and placement of On-Q. SURGEON:  [name], MD. Diagnosis 1. 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 - MARGINS ARE NEGATIVE. - SEE ONCOLOGY TEMPLATE. 2. Lymph node, biopsy, 10 L - ONE BENIGN LYMPH NODE WITH NO TUMOR SEEN (0/1). 3. Lymph node, biopsy, 12 L - ONE BENIGN LYMPH NODE WITH NO TUMOR SEEN (0/1). 4. Lymph node, biopsy, 11 L - ONE BENIGN LYMPH NODE WITH NO TUMOR SEEN (0/1). 5. Lymph node, biopsy, 11 L #2 - ONE BENIGN LYMPH NODE WITH NO TUMOR SEEN (0/1). Microscopic Comment 1. LUNG Specimen, including laterality: Left upper lobe lung wedge with lymph nodes. Procedure: Left upper lobe lung wedge resection with lymph node dissection. Specimen integrity (intact/disrupted): Intact. Tumor site: Left upper lobe lung. Tumor focality: Unifocal. Maximum tumor size (cm): 1.5 cm. Histologic type: Adenosquamous carcinoma. Grade: Well differentiated (low grade). Margins: Negative. Distance to closest margin (cm): 0.5 cm. Visceral pleura invasion:  Not identified. Tumor extension: Tumor confined to lung parenchyma. Treatment effect (if treated with neoadjuvant therapy): Not applicable. Lymph -Vascular invasion: Not identified. Lymph nodes: Number examined - 4 ; Number N1 nodes positive- 0 ; Number N2 nodes positive - 0.   History of Present Illness:     Has remained asymptomatic from a pulmonary standpoint.  She denies cough fever chills.  She has been followed by Dr. Benay Spice with serial CT scans and PET scan which have showed progressive bilateral enlargement of groundglass and semisolid nodules in both lungs.  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  He also has a previous previous history of breast cancer, and anal cancer.     Past Medical History:  Diagnosis Date  . Anal cancer ([place]) 08/09/2012   Squamous cell  . Anxiety    PANIC ATTACKS  . Arthritis   . Breast cancer ([place]) 2009   ER+PR+HER-2+  . Ductal carcinoma ([place]) 02/2008    invasive   . Heart palpitations   . History of radiation therapy 09/02/12-10/10/12   anal 50.4Gy  . History of shingles 10/2014   . HPV (human papilloma virus) anogenital infection    vulvar/ freezing hx  . Lung nodule   . Malignant neoplasm of upper lobe of left lung ([place]) 06/2016  . Personal history of chemotherapy   . Personal history of radiation therapy 2010  . Pneumonia    NO RECENT PROBLEMS  . PONV (postoperative nausea and vomiting)   . Radiation  10/21/08-12/08/08   Right breast 6240 cGy  . Shingles 2017  . Status post chemotherapy 02/2008   Taxol/Herceptin     Social History   Tobacco Use  Smoking Status Former Smoker  . Years: 8.00  . Types: Cigarettes  . Last attempt to quit: 06/05/2013  . Years since quitting: 4.8  Smokeless Tobacco Never Used  Tobacco Comment   stopped smoking cigarettes Jan. 2018    Social History   Substance and Sexual Activity  Alcohol Use Yes  . Alcohol/week:  21.0 standard drinks  . Types: 14 Glasses of wine, 7 Shots of liquor per week   Comment: daily      Allergies  Allergen Reactions  . No Known Allergies     Current Outpatient Medications  Medication Sig Dispense Refill  . albuterol (PROVENTIL HFA;VENTOLIN HFA) 108 (90 Base) MCG/ACT inhaler Inhale 2 puffs into the lungs every 6 (six) hours as needed for wheezing or shortness of breath. 1 Inhaler 2  . CALCIUM PO Take 1 tablet by mouth daily.    Marland Kitchen loratadine (CLARITIN) 10 MG tablet Take 10 mg by mouth as needed for allergies.    . Multiple Vitamins-Minerals (MULTIVITAMIN WITH MINERALS) tablet Take 1 tablet by mouth daily.    Marland Kitchen LORazepam (ATIVAN) 1 MG tablet Take 1 tablet (1 mg total) by mouth every 8 (eight) hours. (Patient not taking: Reported on 04/25/2018) 30 tablet 0   No current facility-administered medications for this visit.        Physical Exam: BP 120/82 (BP Location: Left Arm, Patient Position: Sitting, Cuff Size: Normal)   Pulse 92   Resp 18   Ht 5' 4.5" (1.638 m)   Wt 147 lb 12.8 oz (67 kg)   LMP 04/07/2008   SpO2 95% Comment: RA  BMI 24.98 kg/m  General appearance: alert and cooperative Head: Normocephalic, without obvious abnormality, atraumatic Neck: no adenopathy, no carotid bruit, no JVD, supple, symmetrical, trachea midline and thyroid not enlarged, symmetric, no tenderness/mass/nodules Lymph nodes: Cervical, supraclavicular, and axillary nodes normal. Resp: clear to auscultation bilaterally Back: symmetric, no curvature. ROM normal. No CVA tenderness. Cardio: regular rate and rhythm, S1, S2 normal, no murmur, click, rub or gallop GI: soft, non-tender; bowel sounds normal; no masses,  no organomegaly Extremities: extremities normal, atraumatic, no cyanosis or edema and Homans sign is negative, no sign of DVT Neurologic: Grossly normal  iagnostic Studies & Laboratory data:     Recent Radiology Findings:   Ct Super D Chest W Contrast  Result Date:  04/22/2018 CLINICAL DATA:  58 year old female with history of right-sided breast cancer status post chemotherapy and radiation therapy. Follow-up study. EXAM: CT CHEST WITH CONTRAST TECHNIQUE: Multidetector CT imaging of the chest was performed using thin slice collimation for electromagnetic bronchoscopy planning purposes, with intravenous contrast. CONTRAST:  79m OMNIPAQUE IOHEXOL 300 MG/ML  SOLN COMPARISON:  Multiple priors, most recently PET-CT 01/09/2018. Chest CT 12/13/2017. FINDINGS: Cardiovascular: Heart size is normal. There is no significant pericardial fluid, thickening or pericardial calcification. There is aortic atherosclerosis, as well as atherosclerosis of the great vessels of the mediastinum and the coronary arteries, including calcified atherosclerotic plaque in the left main, left anterior descending and right coronary arteries. Mediastinum/Nodes: No pathologically enlarged mediastinal or hilar lymph nodes. Esophagus is unremarkable in appearance. No axillary lymphadenopathy. Lungs/Pleura: Again noted are multiple pulmonary nodules scattered throughout the lungs bilaterally, many of which are solid, while others are sub solid. The largest of these nodules is predominantly solid in appearance in the  posterior aspect of the right upper lobe (axial image 48 of series 7 and coronal image 75 of series 4) measuring 1.8 x 1.4 x 2.3 cm with internal air bronchograms, with macrolobulated and slightly spiculated margins (previously 11 x 16 mm on 12/13/2017). Another prominent nodule is predominantly solid in appearance in the anterior aspect of the right lower lobe abutting the major fissure (axial image 98 of series 7 and coronal image 69 of series 4) measuring 2.1 x 1.9 x 1.7 cm (previously 1.6 x 1.8 cm on 12/13/2017). Several other smaller lesions are noted throughout the lungs bilaterally, similar in number to prior examinations. No definite new suspicious appearing pulmonary nodules or masses are  noted. Postoperative changes of wedge resection are noted in the left upper lobe. No acute consolidative airspace disease. No pleural effusions. Mild diffuse bronchial wall thickening with mild paraseptal emphysema. Upper Abdomen: Unremarkable. Musculoskeletal: There are no aggressive appearing lytic or blastic lesions noted in the visualized portions of the skeleton. IMPRESSION: 1. Continued progression of multiple solid and subsolid nodules scattered throughout the lungs bilaterally, as above. Findings remain highly concerning for multicentric bronchogenic adenocarcinoma. 2. Aortic atherosclerosis, in addition to left main and 2 vessel coronary artery disease. Please note that although the presence of coronary artery calcium documents the presence of coronary artery disease, the severity of this disease and any potential stenosis cannot be assessed on this non-gated CT examination. Assessment for potential risk factor modification, dietary therapy or pharmacologic therapy may be warranted, if clinically indicated. 3. Mild diffuse bronchial wall thickening with mild paraseptal emphysema; imaging findings suggestive of underlying COPD. Aortic Atherosclerosis (ICD10-I70.0) and Emphysema (ICD10-J43.9). Electronically Signed   By: Vinnie Langton M.D.   On: 04/22/2018 12:46   I have independently reviewed the above radiology studies  and reviewed the findings with the patient.    CLINICAL DATA:  Subsequent treatment strategy for enlarging right upper lobe nodule in this patient with history of anorectal, breast, and lung cancer.  EXAM: NUCLEAR MEDICINE PET SKULL BASE TO THIGH  TECHNIQUE: 7.6 mCi F-18 FDG was injected intravenously. Full-ring PET imaging was performed from the skull base to thigh after the radiotracer. CT data was obtained and used for attenuation correction and anatomic localization.  Fasting blood glucose: 115 mg/dl  COMPARISON:  PET-CT dated 06/22/2016 and CT chest from  12/13/2017  FINDINGS: Mediastinal blood pool activity: SUV max 2.1  NECK: Mostly symmetric activity along the tonsillar tissues, minimal asymmetric activity in the lateral portion of the right palatine tonsil with maximum SUV 5.7, contralateral side 3.8.  Incidental CT findings: Bilateral common carotid atherosclerotic calcification.  CHEST: The dominant 1.5 by 1.2 cm right upper lobe pulmonary nodule has a maximum SUV of 1.8. On the prior PET-CT from 06/22/2016 this nodule measured about 0.8 by 0.9 cm and had a maximum SUV of 1.0.  A right lower lobe nodule posterior to the major fissure measures 1.2 cm in short axis and has a maximum SUV of 1.8, on the prior PET-CT this measured 0.9 cm in short axis and with maximum SUV of 1.4.  A 0.9 by 0.7 cm right upper lobe pulmonary nodule on image 35/8 has increased in size compared to the prior PET-CT but does not have a demonstrably accentuated metabolic activity.  Ground-glass density 1.3 by 0.8 cm nodule in the left lower lobe on image 45/8 is not currently appreciably hypermetabolic.  Postoperative findings from wedge resection in the left upper lobe, with resolution of the prior hypermetabolic activity  in that vicinity.  A lower paratracheal node measuring 1.1 cm in short axis anterior to the carina on image 59/4 has a maximum SUV of 3.6. Previously on the prior PET-CT the maximum SUV was 3.5.  Incidental CT findings: Atherosclerotic calcification of the aortic arch and left anterior descending coronary artery. Paraseptal emphysema.  ABDOMEN/PELVIS: No significant abnormal hypermetabolic activity in this region.  Incidental CT findings: Aortoiliac atherosclerotic vascular disease. 2 mm left kidney lower pole nonobstructive renal calculus. Small lower periaortic and common iliac lymph nodes are not pathologically enlarged or hypermetabolic on today's PET/CT. Small uterine fibroid along the posterior  fundus.  SKELETON: Stable accentuated activity at the L5-S1 intervertebral level, likely inflammatory. No appreciable osseous metastatic disease.  Incidental CT findings: Levoconvex lumbar scoliosis.  IMPRESSION: 1. The enlarging 1.5 by 1.2 cm right upper lobe pulmonary nodule has a maximum SUV of 1.8. Back on 06/22/2016 this lesion measured 0.8 by 0.9 cm and had a maximum SUV of 1.0. The enlargement and progression in metabolic activity favors low-grade adenocarcinoma. 2. Similarly, a right lower lobe nodule posterior to the major fissure has mildly enlarged and minimally increased in maximum SUV compared to the prior PET-CT of 06/12/2016, and could likewise represent low-grade adenocarcinoma. 3. A 8 mm right upper lobe pulmonary nodule on image 35/8 has increased in size compared to the prior PET-CT but does not currently have accentuated metabolic activity. Surveillance of this lesion and some of the other nodules such as the ground-glass density 1.3 by 0.8 cm non hypermetabolic lesion in the left lower lobe is suggested. 4. Stable activity in stable minimal enlargement of a lower paratracheal node anterior to the carina. 5. No findings of malignancy involving the abdomen/pelvis or skeleton. 6. Other imaging findings of potential clinical significance: Aortic Atherosclerosis (ICD10-I70.0). Coronary atherosclerosis. Nonobstructive left nephrolithiasis. Small uterine fibroids. Chronic inflammatory activity at L5-S1. Levoconvex lumbar scoliosis.   Electronically Signed   By: Van Clines M.D.   On: 01/09/2018 16:30   Ct Chest W Contrast  Result Date: 11/14/2016 CLINICAL DATA:  History of left lung cancer, follow-up right lower lobe nodule. History of right breast cancer and anal cancer. EXAM: CT CHEST WITH CONTRAST TECHNIQUE: Multidetector CT imaging of the chest was performed during intravenous contrast administration. CONTRAST:  50m ISOVUE-300 IOPAMIDOL  (ISOVUE-300) INJECTION 61% COMPARISON:  PET-CT dated 06/22/2016 FINDINGS: Cardiovascular: Heart is normal in size.  No pericardial effusion. Coronary atherosclerosis of the LAD. Atherosclerotic calcifications of the aortic arch. No evidence of thoracic aortic aneurysm. Mediastinum/Nodes: Small mediastinal lymph nodes, including a 9 mm short axis low right paratracheal node with preservation of the normal fatty hilum. No suspicious axillary lymphadenopathy. Visualized thyroid is unremarkable. Lungs/Pleura: Mild biapical pleural-parenchymal scarring. Postsurgical changes related to prior left upper lobe wedge resection. 14 x 16 mm irregular nodular opacity in the anterior right lower lobe (series 4/image 78), non FDG avid. Additional scattered nodules in the right lung measuring up to 9 mm in the medial right upper lobe (series 4/ image 39), non FDG avid although possibly beneath the size threshold for PET sensitivity. No focal consolidation. No pleural effusion or pneumothorax. Upper Abdomen: Visualized upper abdomen is unremarkable. Musculoskeletal: Visualized osseous structures are within normal limits. IMPRESSION: Status post left upper lobe wedge resection. Stable 14 x 16 mm irregular nodular opacity in the anterior right lower lobe, non FDG avid on prior PET. Additional scattered right lung nodules measuring up to 9 mm in the medial right upper lobe, unchanged. Aortic Atherosclerosis (ICD10-I70.0). Electronically  Signed   By: Julian Hy M.D.   On: 11/14/2016 15:06     Previous CT of chest   IMPRESSION: 1. A posterior left upper lobe solid 11 mm nodule is suspicious for metastatic disease versus less likely attack metachronous primary bronchogenic carcinoma. Consider tissue sampling versus further evaluation with PET. 2. Other solid and sub solid pulmonary nodules are primarily similar. 10 mm left lower lobe and 9 mm right upper lobe sub solid nodules have enlarged. Apparent increase in size of  a solid right lower lobe nodule, favored to be due to thinner slice collimation today. 3. No thoracic adenopathy. 4. Age advanced coronary artery atherosclerosis. Recommend assessment of coronary risk factors and consideration of medical therapy.  Recent Lab Findings: Lab Results  Component Value Date   WBC 7.7 07/07/2016   HGB 11.5 (L) 07/07/2016   HCT 35.8 (L) 07/07/2016   PLT 192 07/07/2016   GLUCOSE 107 (H) 07/07/2016   CHOL 224 (H) 04/07/2016   TRIG 66 04/07/2016   HDL 81 04/07/2016   LDLCALC 130 (H) 04/07/2016   ALT 13 (L) 07/07/2016   AST 21 07/07/2016   NA 139 07/07/2016   K 3.9 07/07/2016   CL 101 07/07/2016   CREATININE 0.61 07/07/2016   BUN 5 (L) 07/07/2016   CO2 31 07/07/2016   TSH 3.173 12/25/2014   INR 1.00 07/04/2016      Assessment / Plan:      Patient with progressive bilateral pulmonary groundglass semisolid nodules, after resection of adenosquamous carcinoma of the lung in February 2019, stage Ia with previous history of breast cancer and anal cancer.  With the progressive nature of the findings on CT scan I have recommended to the patient that we proceed with navigation bronchoscopy to pathologic evaluate the largest in the most progressive of these lesions primarily in the right lung.  In addition we can consider molecular testing on the pathologic material and/or the previously resected lesion in the left lung.   I discussed with the patient and her husband proceeding with bronchoscopy navigation bronchoscopy with lung biopsy possible placement of fiducials and EVIS of the #7 lymph node which is very mildly enlarged. We will tentatively plan to proceed on December 27.     [name] MD      [address] [place] [address] Office [phone]   Beeper [phone]  04/25/2018 11:05 AM

## 2018-04-25 NOTE — Progress Notes (Signed)
Oncology Nurse Navigator Documentation  Oncology Nurse Navigator Flowsheets 04/25/2018  Navigator Location [facility]  Navigator Encounter Type Other/I received a message from Dr. Everrett Coombe office.  He would like recent ct on a cd for Super D.  I called radiology to request.  I then updated Dr. Everrett Coombe office.   Treatment Phase Abnormal Scans  Barriers/Navigation Needs Coordination of Care  Interventions Coordination of Care  Coordination of Care Other  Acuity Level 3  Time Spent with Patient 15

## 2018-04-29 NOTE — Pre-Procedure Instructions (Signed)
Gina Cervantes  04/29/2018      [id number] [address], [place] [address] Phone: [phone] Fax: [phone]    Your procedure is scheduled on May 03, 2018  Report to [facility] Admitting at 530 AM.  Call this number if you have problems the morning of surgery:  [phone]   Remember:  Do not eat or drink after midnight.    Take these medicines the morning of surgery with A SIP OF WATER  Albuterol inhaler-if needed-bring inhaler with you loratidine (claritin) Lorazepam (ativan)-if needed  7 days prior to surgery STOP taking any Aspirin (unless otherwise instructed by your surgeon), Aleve, Naproxen, Ibuprofen, Motrin, Advil, Goody's, BC's, all herbal medications, fish oil, and all vitamins     Do not wear jewelry, make-up or nail polish.  Do not wear lotions, powders, or perfumes, or deodorant.  Do not shave 48 hours prior to surgery.    Do not bring valuables to the hospital.  [facility] is not responsible for any belongings or valuables.  Contacts, dentures or bridgework may not be worn into surgery.  Leave your suitcase in the car.  After surgery it may be brought to your room.  For patients admitted to the hospital, discharge time will be determined by your treatment team.  Patients discharged the day of surgery will not be allowed to drive home.    [facility]- Preparing For Surgery  Before surgery, you can play an important role. Because skin is not sterile, your skin needs to be as free of germs as possible. You can reduce the number of germs on your skin by washing with CHG (chlorahexidine gluconate) Soap before surgery.  CHG is an antiseptic cleaner which kills germs and bonds with the skin to continue killing germs even after washing.    Oral Hygiene is also important to reduce your risk of infection.  Remember - BRUSH YOUR TEETH THE MORNING OF SURGERY WITH YOUR REGULAR  TOOTHPASTE  Please do not use if you have an allergy to CHG or antibacterial soaps. If your skin becomes reddened/irritated stop using the CHG.  Do not shave (including legs and underarms) for at least 48 hours prior to first CHG shower. It is OK to shave your face.  Please follow these instructions carefully.   1. Shower the NIGHT BEFORE SURGERY and the MORNING OF SURGERY with CHG.   2. If you chose to wash your hair, wash your hair first as usual with your normal shampoo.  3. After you shampoo, rinse your hair and body thoroughly to remove the shampoo.  4. Use CHG as you would any other liquid soap. You can apply CHG directly to the skin and wash gently with a scrungie or a clean washcloth.   5. Apply the CHG Soap to your body ONLY FROM THE NECK DOWN.  Do not use on open wounds or open sores. Avoid contact with your eyes, ears, mouth and genitals (private parts). Wash Face and genitals (private parts)  with your normal soap.  6. Wash thoroughly, paying special attention to the area where your surgery will be performed.  7. Thoroughly rinse your body with warm water from the neck down.  8. DO NOT shower/wash with your normal soap after using and rinsing off the CHG Soap.  9. Pat yourself dry with a CLEAN TOWEL.  10. Wear CLEAN PAJAMAS to bed the night before surgery, wear comfortable clothes  the morning of surgery  11. Place CLEAN SHEETS on your bed the night of your first shower and DO NOT SLEEP WITH PETS.  Day of Surgery:  Do not apply any deodorants/lotions.  Please wear clean clothes to the hospital/surgery center.   Remember to brush your teeth WITH YOUR REGULAR TOOTHPASTE.   Please read over the following fact sheets that you were given.

## 2018-04-30 ENCOUNTER — Inpatient Hospital Stay (HOSPITAL_COMMUNITY)
Admission: RE | Admit: 2018-04-30 | Discharge: 2018-04-30 | Disposition: A | Discharge status: Home / Self Care | Payer: Self-pay | Source: Ambulatory Visit

## 2018-05-02 ENCOUNTER — Other Ambulatory Visit: Payer: Self-pay

## 2018-05-02 ENCOUNTER — Encounter (HOSPITAL_COMMUNITY): Payer: Self-pay | Admitting: *Deleted

## 2018-05-02 NOTE — Progress Notes (Signed)
Gina Cervantes denies chest pain or shortness of breath.  Patient does report a Sty of left eye, she is treating it with warm compresses.  I told Gina Cervantes that I would inform Dr's office.  I called Dr Everrett Coombe office and left a voice message for Manuela Schwartz with the information.

## 2018-05-03 ENCOUNTER — Encounter (HOSPITAL_COMMUNITY)
Admission: RE | Disposition: A | Discharge status: Home / Self Care | Payer: Self-pay | Source: Home / Self Care | Attending: Cardiothoracic Surgery

## 2018-05-03 ENCOUNTER — Ambulatory Visit (HOSPITAL_COMMUNITY): Payer: BLUE CROSS/BLUE SHIELD | Admitting: Anesthesiology

## 2018-05-03 ENCOUNTER — Ambulatory Visit (HOSPITAL_COMMUNITY): Payer: BLUE CROSS/BLUE SHIELD

## 2018-05-03 ENCOUNTER — Ambulatory Visit (HOSPITAL_COMMUNITY)
Admission: RE | Admit: 2018-05-03 | Discharge: 2018-05-03 | Disposition: A | Discharge status: Home / Self Care | Payer: BLUE CROSS/BLUE SHIELD | Attending: Cardiothoracic Surgery | Admitting: Cardiothoracic Surgery

## 2018-05-03 ENCOUNTER — Encounter (HOSPITAL_COMMUNITY): Payer: Self-pay | Admitting: *Deleted

## 2018-05-03 ENCOUNTER — Ambulatory Visit (HOSPITAL_COMMUNITY): Payer: BLUE CROSS/BLUE SHIELD | Admitting: Vascular Surgery

## 2018-05-03 DIAGNOSIS — F419 Anxiety disorder, unspecified: Secondary | ICD-10-CM | POA: Insufficient documentation

## 2018-05-03 DIAGNOSIS — C3412 Malignant neoplasm of upper lobe, left bronchus or lung: Secondary | ICD-10-CM | POA: Diagnosis not present

## 2018-05-03 DIAGNOSIS — R911 Solitary pulmonary nodule: Secondary | ICD-10-CM | POA: Diagnosis not present

## 2018-05-03 DIAGNOSIS — I2510 Atherosclerotic heart disease of native coronary artery without angina pectoris: Secondary | ICD-10-CM | POA: Insufficient documentation

## 2018-05-03 DIAGNOSIS — M1990 Unspecified osteoarthritis, unspecified site: Secondary | ICD-10-CM | POA: Insufficient documentation

## 2018-05-03 DIAGNOSIS — Z419 Encounter for procedure for purposes other than remedying health state, unspecified: Secondary | ICD-10-CM

## 2018-05-03 DIAGNOSIS — Z923 Personal history of irradiation: Secondary | ICD-10-CM | POA: Insufficient documentation

## 2018-05-03 DIAGNOSIS — R918 Other nonspecific abnormal finding of lung field: Secondary | ICD-10-CM | POA: Diagnosis not present

## 2018-05-03 DIAGNOSIS — I700 Atherosclerosis of aorta: Secondary | ICD-10-CM | POA: Diagnosis not present

## 2018-05-03 DIAGNOSIS — Z9221 Personal history of antineoplastic chemotherapy: Secondary | ICD-10-CM | POA: Diagnosis not present

## 2018-05-03 DIAGNOSIS — Z87891 Personal history of nicotine dependence: Secondary | ICD-10-CM | POA: Insufficient documentation

## 2018-05-03 DIAGNOSIS — Z85048 Personal history of other malignant neoplasm of rectum, rectosigmoid junction, and anus: Secondary | ICD-10-CM | POA: Insufficient documentation

## 2018-05-03 DIAGNOSIS — Z853 Personal history of malignant neoplasm of breast: Secondary | ICD-10-CM | POA: Diagnosis not present

## 2018-05-03 DIAGNOSIS — Z01818 Encounter for other preprocedural examination: Secondary | ICD-10-CM | POA: Diagnosis not present

## 2018-05-03 HISTORY — PX: VIDEO BRONCHOSCOPY WITH ENDOBRONCHIAL ULTRASOUND: SHX6177

## 2018-05-03 HISTORY — PX: VIDEO BRONCHOSCOPY: SHX5072

## 2018-05-03 HISTORY — PX: VIDEO BRONCHOSCOPY WITH ENDOBRONCHIAL NAVIGATION: SHX6175

## 2018-05-03 LAB — COMPREHENSIVE METABOLIC PANEL
ALT: 18 U/L (ref 0–44)
AST: 25 U/L (ref 15–41)
Albumin: 3.8 g/dL (ref 3.5–5.0)
Alkaline Phosphatase: 59 U/L (ref 38–126)
Anion gap: 12 (ref 5–15)
BUN: 9 mg/dL (ref 6–20)
CO2: 31 mmol/L (ref 22–32)
Calcium: 9.2 mg/dL (ref 8.9–10.3)
Chloride: 97 mmol/L — ABNORMAL LOW (ref 98–111)
Creatinine, Ser: 0.62 mg/dL (ref 0.44–1.00)
GFR calc Af Amer: >60 mL/min (ref >60)
GFR calc non Af Amer: >60 mL/min (ref >60)
Glucose, Bld: 111 mg/dL — ABNORMAL HIGH (ref 70–99)
Potassium: 4.1 mmol/L (ref 3.5–5.1)
Sodium: 140 mmol/L (ref 135–145)
Total Bilirubin: 1.2 mg/dL (ref 0.3–1.2)
Total Protein: 7.2 g/dL (ref 6.5–8.1)

## 2018-05-03 LAB — PROTIME-INR
INR: 1.06
Prothrombin Time: 13.7 seconds (ref 11.4–15.2)

## 2018-05-03 LAB — CBC
HCT: 55.3 % — ABNORMAL HIGH (ref 36.0–46.0)
Hemoglobin: 18.5 g/dL — ABNORMAL HIGH (ref 12.0–15.0)
MCH: 34.8 pg — ABNORMAL HIGH (ref 26.0–34.0)
MCHC: 33.5 g/dL (ref 30.0–36.0)
MCV: 104.1 fL — ABNORMAL HIGH (ref 80.0–100.0)
Platelets: 173 10*3/uL (ref 150–400)
RBC: 5.31 MIL/uL — ABNORMAL HIGH (ref 3.87–5.11)
RDW: 14.4 % (ref 11.5–15.5)
WBC: 5 10*3/uL (ref 4.0–10.5)
nRBC: 0 % (ref 0.0–0.2)

## 2018-05-03 LAB — APTT: aPTT: 26 seconds (ref 24–36)

## 2018-05-03 SURGERY — VIDEO BRONCHOSCOPY WITH ENDOBRONCHIAL NAVIGATION
Anesthesia: General

## 2018-05-03 MED ORDER — LIDOCAINE HCL (CARDIAC) PF 100 MG/5ML IV SOSY
PREFILLED_SYRINGE | INTRAVENOUS | Status: DC | PRN
  Administered 2018-05-03: 50 mg via INTRAVENOUS

## 2018-05-03 MED ORDER — PHENYLEPHRINE HCL 10 MG/ML IJ SOLN
INTRAMUSCULAR | Status: DC | PRN
  Administered 2018-05-03: 80 ug via INTRAVENOUS

## 2018-05-03 MED ORDER — LACTATED RINGERS IV SOLN
INTRAVENOUS | Status: DC | PRN
  Administered 2018-05-03: 07:00:00 via INTRAVENOUS

## 2018-05-03 MED ORDER — SUCCINYLCHOLINE CHLORIDE 200 MG/10ML IV SOSY
PREFILLED_SYRINGE | INTRAVENOUS | Status: AC
  Filled 2018-05-03: qty 10

## 2018-05-03 MED ORDER — LIDOCAINE 2% (20 MG/ML) 5 ML SYRINGE
INTRAMUSCULAR | Status: AC
  Filled 2018-05-03: qty 5

## 2018-05-03 MED ORDER — SUGAMMADEX SODIUM 200 MG/2ML IV SOLN
INTRAVENOUS | Status: DC | PRN
  Administered 2018-05-03: 200 mg via INTRAVENOUS

## 2018-05-03 MED ORDER — FENTANYL CITRATE (PF) 250 MCG/5ML IJ SOLN
INTRAMUSCULAR | Status: AC
  Filled 2018-05-03: qty 5

## 2018-05-03 MED ORDER — 0.9 % SODIUM CHLORIDE (POUR BTL) OPTIME
TOPICAL | Status: DC | PRN
  Administered 2018-05-03: 1000 mL

## 2018-05-03 MED ORDER — PROPOFOL 10 MG/ML IV BOLUS
INTRAVENOUS | Status: DC | PRN
  Administered 2018-05-03: 150 mg via INTRAVENOUS

## 2018-05-03 MED ORDER — ROCURONIUM BROMIDE 100 MG/10ML IV SOLN
INTRAVENOUS | Status: DC | PRN
  Administered 2018-05-03: 20 mg via INTRAVENOUS
  Administered 2018-05-03: 50 mg via INTRAVENOUS
  Administered 2018-05-03: 20 mg via INTRAVENOUS
  Administered 2018-05-03: 30 mg via INTRAVENOUS

## 2018-05-03 MED ORDER — PROPOFOL 10 MG/ML IV BOLUS
INTRAVENOUS | Status: AC
  Filled 2018-05-03: qty 20

## 2018-05-03 MED ORDER — PHENYLEPHRINE 40 MCG/ML (10ML) SYRINGE FOR IV PUSH (FOR BLOOD PRESSURE SUPPORT)
PREFILLED_SYRINGE | INTRAVENOUS | Status: AC
  Filled 2018-05-03: qty 10

## 2018-05-03 MED ORDER — ONDANSETRON HCL 4 MG/2ML IJ SOLN: 4.0000 mg | Freq: Once | INTRAMUSCULAR | Status: DC | PRN

## 2018-05-03 MED ORDER — OXYCODONE HCL 5 MG/5ML PO SOLN: 5.0000 mg | Freq: Once | ORAL | Status: DC | PRN

## 2018-05-03 MED ORDER — PHENYLEPHRINE 40 MCG/ML (10ML) SYRINGE FOR IV PUSH (FOR BLOOD PRESSURE SUPPORT)
PREFILLED_SYRINGE | INTRAVENOUS | Status: DC | PRN
  Administered 2018-05-03: 120 ug via INTRAVENOUS
  Administered 2018-05-03: 80 ug via INTRAVENOUS

## 2018-05-03 MED ORDER — DEXAMETHASONE SODIUM PHOSPHATE 10 MG/ML IJ SOLN
INTRAMUSCULAR | Status: AC
  Filled 2018-05-03: qty 2

## 2018-05-03 MED ORDER — FENTANYL CITRATE (PF) 250 MCG/5ML IJ SOLN
INTRAMUSCULAR | Status: DC | PRN
  Administered 2018-05-03 (×3): 50 ug via INTRAVENOUS

## 2018-05-03 MED ORDER — ROCURONIUM BROMIDE 50 MG/5ML IV SOSY
PREFILLED_SYRINGE | INTRAVENOUS | Status: AC
  Filled 2018-05-03: qty 5

## 2018-05-03 MED ORDER — FENTANYL CITRATE (PF) 100 MCG/2ML IJ SOLN: 25.0000 ug | INTRAMUSCULAR | Status: DC | PRN

## 2018-05-03 MED ORDER — EPHEDRINE 5 MG/ML INJ
INTRAVENOUS | Status: AC
  Filled 2018-05-03: qty 10

## 2018-05-03 MED ORDER — EPINEPHRINE PF 1 MG/ML IJ SOLN
INTRAMUSCULAR | Status: DC | PRN
  Administered 2018-05-03: 1 mg via ENDOTRACHEOPULMONARY

## 2018-05-03 MED ORDER — DEXAMETHASONE SODIUM PHOSPHATE 10 MG/ML IJ SOLN
INTRAMUSCULAR | Status: DC | PRN
  Administered 2018-05-03: 10 mg via INTRAVENOUS

## 2018-05-03 MED ORDER — ONDANSETRON HCL 4 MG/2ML IJ SOLN
INTRAMUSCULAR | Status: AC
  Filled 2018-05-03: qty 2

## 2018-05-03 MED ORDER — EPINEPHRINE PF 1 MG/ML IJ SOLN
INTRAMUSCULAR | Status: AC
  Filled 2018-05-03: qty 1

## 2018-05-03 MED ORDER — OXYCODONE HCL 5 MG PO TABS: 5.0000 mg | ORAL_TABLET | Freq: Once | ORAL | Status: DC | PRN

## 2018-05-03 MED ORDER — MIDAZOLAM HCL 5 MG/5ML IJ SOLN
INTRAMUSCULAR | Status: DC | PRN
  Administered 2018-05-03: 2 mg via INTRAVENOUS

## 2018-05-03 MED ORDER — ONDANSETRON HCL 4 MG/2ML IJ SOLN
INTRAMUSCULAR | Status: DC | PRN
  Administered 2018-05-03: 4 mg via INTRAVENOUS

## 2018-05-03 MED ORDER — MIDAZOLAM HCL 2 MG/2ML IJ SOLN
INTRAMUSCULAR | Status: AC
  Filled 2018-05-03: qty 2

## 2018-05-03 SURGICAL SUPPLY — 48 items
ADAPTER BRONCHOSCOPE OLYMPUS (ADAPTER) ×2 IMPLANT
ADAPTER VALVE BIOPSY EBUS (MISCELLANEOUS) IMPLANT
ADPR BSCP OLMPS EDG (ADAPTER) ×1
ADPTR VALVE BIOPSY EBUS (MISCELLANEOUS)
BRUSH BIOPSY BRONCH 10 SDTNB (MISCELLANEOUS) ×1 IMPLANT
BRUSH CYTOL CELLEBRITY 1.5X140 (MISCELLANEOUS) IMPLANT
BRUSH SUPERTRAX BIOPSY (INSTRUMENTS) IMPLANT
BRUSH SUPERTRAX NDL-TIP CYTO (INSTRUMENTS) ×1 IMPLANT
CANISTER SUCT 3000ML PPV (MISCELLANEOUS) ×3 IMPLANT
CHANNEL WORK EXTEND EDGE 180 (KITS) IMPLANT
CHANNEL WORK EXTEND EDGE 90 (KITS) IMPLANT
CONT SPEC 4OZ CLIKSEAL STRL BL (MISCELLANEOUS) ×5 IMPLANT
COVER BACK TABLE 60X90IN (DRAPES) ×3 IMPLANT
COVER DOME SNAP 22 D (MISCELLANEOUS) ×2 IMPLANT
FILTER STRAW FLUID ASPIR (MISCELLANEOUS) IMPLANT
FORCEPS BIOP RJ4 1.8 (CUTTING FORCEPS) IMPLANT
FORCEPS BIOP SUPERTRX PREMAR (INSTRUMENTS) ×1 IMPLANT
GAUZE SPONGE 4X4 12PLY STRL (GAUZE/BANDAGES/DRESSINGS) ×3 IMPLANT
GLOVE BIO SURGEON STRL SZ 6.5 (GLOVE) ×3 IMPLANT
KIT CLEAN ENDO COMPLIANCE (KITS) ×6 IMPLANT
KIT PROCEDURE EDGE 180 (KITS) ×1 IMPLANT
KIT PROCEDURE EDGE 90 (KITS) IMPLANT
KIT TURNOVER KIT B (KITS) ×3 IMPLANT
MARKER SKIN DUAL TIP RULER LAB (MISCELLANEOUS) ×3 IMPLANT
NDL ASPIRATION VIZISHOT 19G (NEEDLE) IMPLANT
NDL ASPIRATION VIZISHOT 21G (NEEDLE) ×1 IMPLANT
NDL FILTER BLUNT 18X1 1/2 (NEEDLE) IMPLANT
NDL SUPERTRX PREMARK BIOPSY (NEEDLE) IMPLANT
NEEDLE ASPIRATION VIZISHOT 19G (NEEDLE) IMPLANT
NEEDLE ASPIRATION VIZISHOT 21G (NEEDLE) ×2 IMPLANT
NEEDLE FILTER BLUNT 18X 1/2SAF (NEEDLE)
NEEDLE FILTER BLUNT 18X1 1/2 (NEEDLE) IMPLANT
NEEDLE SUPERTRX PREMARK BIOPSY (NEEDLE) ×2 IMPLANT
NS IRRIG 1000ML POUR BTL (IV SOLUTION) ×3 IMPLANT
OIL SILICONE PENTAX (PARTS (SERVICE/REPAIRS)) ×3 IMPLANT
PAD ARMBOARD 7.5X6 YLW CONV (MISCELLANEOUS) ×6 IMPLANT
PATCHES PATIENT (LABEL) ×6 IMPLANT
SYR 20CC LL (SYRINGE) ×2 IMPLANT
SYR 20ML ECCENTRIC (SYRINGE) ×4 IMPLANT
SYR 3ML LL SCALE MARK (SYRINGE) ×2 IMPLANT
TOWEL GREEN STERILE FF (TOWEL DISPOSABLE) ×3 IMPLANT
TRAP SPECIMEN MUCOUS 40CC (MISCELLANEOUS) ×3 IMPLANT
TUBE CONNECTING 20X1/4 (TUBING) ×3 IMPLANT
UNDERPAD 30X30 (UNDERPADS AND DIAPERS) ×2 IMPLANT
VALVE BIOPSY  SINGLE USE (MISCELLANEOUS) ×2
VALVE BIOPSY SINGLE USE (MISCELLANEOUS) ×2 IMPLANT
VALVE SUCTION BRONCHIO DISP (MISCELLANEOUS) ×4 IMPLANT
WATER STERILE IRR 1000ML POUR (IV SOLUTION) ×3 IMPLANT

## 2018-05-03 NOTE — Discharge Instructions (Signed)
Flexible Bronchoscopy, Care After This sheet gives you information about how to care for yourself after your procedure. Your health care provider may also give you more specific instructions. If you have problems or questions, contact your health care provider. What can I expect after the procedure? After the procedure, it is common to have the following symptoms for 24-48 hours:  A cough that is worse than it was before the procedure.  A low-grade fever.  A sore throat or hoarse voice.  Small streaks of blood in the mucus from your lungs (sputum), if tissue samples were removed (biopsy). Follow these instructions at home: Eating and drinking  Do not eat or drink anything (including water) for 2 hours after your procedure, or until your numbing medicine (local anesthetic) has worn off. Having a numb throat increases your risk of burning yourself or choking.  After your numbness is gone and your cough and gag reflexes have returned, you may start eating only soft foods and slowly drinking liquids.  The day after the procedure, return to your normal diet. Driving  Do not drive for 24 hours if you were given a medicine to help you relax (sedative).  Do not drive or use heavy machinery while taking prescription pain medicine. General instructions   Take over-the-counter and prescription medicines only as told by your health care provider.  Return to your normal activities as told by your health care provider. Ask your health care provider what activities are safe for you.  Do not use any products that contain nicotine or tobacco, such as cigarettes and e-cigarettes. If you need help quitting, ask your health care provider.  Keep all follow-up visits as told by your health care provider. This is important, especially if you had a biopsy taken. Get help right away if:  You have shortness of breath that gets worse.  You become light-headed or feel like you might faint.  You have  chest pain.  You cough up more than a small amount of blood.  The amount of blood you cough up increases. Summary  Common symptoms in the 24-48 hours following a flexible bronchoscopy include cough, low-grade fever, sore throat or hoarse voice, and blood-streaked mucus from the lungs (if you had a biopsy).  Do not eat or drink anything (including water) for 2 hours after your procedure, or until your local anesthetic has worn off. You can return to your normal diet the day after the procedure.  Get help right away if you develop worsening shortness of breath, have chest pain, become light-headed, or cough up more than a small amount of blood. This information is not intended to replace advice given to you by your health care provider. Make sure you discuss any questions you have with your health care provider. Document Released: 11/11/2004 Document Revised: 05/12/2016 Document Reviewed: 05/12/2016 Elsevier Interactive Patient Education  2019 [facility] Instructions  Activity: Get plenty of rest for the remainder of the day. A responsible individual must stay with you for 24 hours following the procedure.  For the next 24 hours, DO NOT: -Drive a car -Paediatric nurse -Drink alcoholic beverages -Take any medication unless instructed by your physician -Make any legal decisions or sign important papers.  Meals: Start with liquid foods such as gelatin or soup. Progress to regular foods as tolerated. Avoid greasy, spicy, heavy foods. If nausea and/or vomiting occur, drink only clear liquids until the nausea and/or vomiting subsides. Call your physician if vomiting  continues.  Special Instructions/Symptoms: Your throat may feel dry or sore from the anesthesia or the breathing tube placed in your throat during surgery. If this causes discomfort, gargle with warm salt water. The discomfort should disappear within 24 hours.  If you had a scopolamine patch  placed behind your ear for the management of post- operative nausea and/or vomiting:  1. The medication in the patch is effective for 72 hours, after which it should be removed.  Wrap patch in a tissue and discard in the trash. Wash hands thoroughly with soap and water. 2. You may remove the patch earlier than 72 hours if you experience unpleasant side effects which may include dry mouth, dizziness or visual disturbances. 3. Avoid touching the patch. Wash your hands with soap and water after contact with the patch.

## 2018-05-03 NOTE — Brief Op Note (Signed)
      [address]       [place] [address]             [phone]      05/03/2018  9:31 AM  PATIENT:  Gina Cervantes  57 y.o. female  PRE-OPERATIVE DIAGNOSIS:  BILATERAL PULMONARY NODULES  POST-OPERATIVE DIAGNOSIS:  BILATERAL PULMONARY NODULES  PROCEDURE:  Procedure(s): VIDEO BRONCHOSCOPY WITH ENDOBRONCHIAL NAVIGATION WITH BIOPSY (N/A) VIDEO BRONCHOSCOPY WITH ENDOBRONCHIAL ULTRASOUND (N/A) VIDEO BRONCHOSCOPY (N/A)  SURGEON:  Surgeon(s) and Role:    * [name], MD - Primary   ANESTHESIA:   general  EBL:  none  BLOOD ADMINISTERED:none  DRAINS: none   LOCAL MEDICATIONS USED:  NONE  SPECIMEN:  Source of Specimen:  # 7 nodes and right lower lower lobe lesion   DISPOSITION OF SPECIMEN:  PATHOLOGY  COUNTS:  YES   DICTATION: .Dragon Dictation  PLAN OF CARE: Discharge to home after PACU  PATIENT DISPOSITION:  PACU - hemodynamically stable.   Delay start of Pharmacological VTE agent (>24hrs) due to surgical blood loss or risk of bleeding: yes

## 2018-05-03 NOTE — Transfer of Care (Signed)
Immediate Anesthesia Transfer of Care Note  Patient: Gina Cervantes  Procedure(s) Performed: VIDEO BRONCHOSCOPY WITH ENDOBRONCHIAL NAVIGATION WITH BIOPSY (N/A ) VIDEO BRONCHOSCOPY WITH ENDOBRONCHIAL ULTRASOUND (N/A ) VIDEO BRONCHOSCOPY (N/A )  Patient Location: PACU  Anesthesia Type:General  Level of Consciousness: awake, alert , oriented and patient cooperative  Airway & Oxygen Therapy: Patient Spontanous Breathing and Patient connected to face mask oxygen  Post-op Assessment: Report given to RN and Post -op Vital signs reviewed and stable  Post vital signs: Reviewed and stable  Last Vitals:  Vitals Value Taken Time  BP 134/69 05/03/2018  9:45 AM  Temp 36.1 C 05/03/2018  9:45 AM  Pulse 98 05/03/2018  9:46 AM  Resp 19 05/03/2018  9:46 AM  SpO2 99 % 05/03/2018  9:46 AM  Vitals shown include unvalidated device data.  Last Pain:  Vitals:   05/03/18 0613  TempSrc: Oral  PainSc:       Patients Stated Pain Goal: 3 (03/24/34 6701)  Complications: No apparent anesthesia complications

## 2018-05-03 NOTE — Anesthesia Procedure Notes (Signed)
Procedure Name: Intubation Date/Time: 05/03/2018 7:38 AM Performed by: Shirlyn Goltz, CRNA Pre-anesthesia Checklist: Patient identified, Emergency Drugs available, Suction available and Patient being monitored Patient Re-evaluated:Patient Re-evaluated prior to induction Oxygen Delivery Method: Circle system utilized Preoxygenation: Pre-oxygenation with 100% oxygen Induction Type: IV induction Ventilation: Mask ventilation without difficulty Laryngoscope Size: Mac and 3 Grade View: Grade I Tube type: Oral Tube size: 8.5 mm Number of attempts: 1 Airway Equipment and Method: Stylet Placement Confirmation: ETT inserted through vocal cords under direct vision,  positive ETCO2 and breath sounds checked- equal and bilateral Secured at: 19 cm Tube secured with: Tape Dental Injury: Teeth and Oropharynx as per pre-operative assessment

## 2018-05-03 NOTE — H&P (Signed)
[address]       Cervantes,Gina [address]             Cervantes      Gina Cervantes Gina Cervantes Medical Record #[id number] Date of Birth: [birth year]   Referring: Ladell Pier, MD Primary Care: Patient, No Pcp Per   Chief Complaint:   POST OP FOLLOW UP Cancer Staging Lung cancer, left  upper lobe [facility]) Staging form: Lung, AJCC 8th Edition - Pathologic stage from 07/07/2016: Stage IA2 (pT1b, pN0, cM0) - Signed by [name], MD on 07/07/2016  07/05/2016   OPERATIVE REPORT PREOPERATIVE DIAGNOSIS:  Left upper lobe lung nodule with history of anal and breast cancer. PREOPERATIVE DIAGNOSIS:  Left upper lobe lung nodule with history of anal and breast cancer. SURGICAL PROCEDURE: 1. Bronchoscopy. 2. Left video-assisted thoracoscopy. 3. Wedge resection of left upper lobe mass. 4. Lymph node dissection and placement of On-Q. SURGEON:  [name], MD. Diagnosis 1. 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 - MARGINS ARE NEGATIVE. - SEE ONCOLOGY TEMPLATE. 2. Lymph node, biopsy, 10 L - ONE BENIGN LYMPH NODE WITH NO TUMOR SEEN (0/1). 3. Lymph node, biopsy, 12 L - ONE BENIGN LYMPH NODE WITH NO TUMOR SEEN (0/1). 4. Lymph node, biopsy, 11 L - ONE BENIGN LYMPH NODE WITH NO TUMOR SEEN (0/1). 5. Lymph node, biopsy, 11 L #2 - ONE BENIGN LYMPH NODE WITH NO TUMOR SEEN (0/1). Microscopic Comment 1. LUNG Specimen, including laterality: Left upper lobe lung wedge with lymph nodes. Procedure: Left upper lobe lung wedge resection with lymph node dissection. Specimen integrity (intact/disrupted): Intact. Tumor site: Left upper lobe lung. Tumor focality: Unifocal. Maximum tumor size (cm): 1.5 cm. Histologic type: Adenosquamous carcinoma. Grade: Well differentiated (low grade). Margins: Negative. Distance to closest margin (cm): 0.5 cm. Visceral pleura  invasion: Not identified. Tumor extension: Tumor confined to lung parenchyma. Treatment effect (if treated with neoadjuvant therapy): Not applicable. Lymph -Vascular invasion: Not identified. Lymph nodes: Number examined - 4 ; Number N1 nodes positive- 0 ; Number N2 nodes positive - 0.   History of Present Illness:     Has remained asymptomatic from a pulmonary standpoint.  She denies cough fever chills.  She has been followed by Dr. Benay Spice with serial CT scans and PET scan which have showed progressive bilateral enlargement of groundglass and semisolid nodules in both lungs.  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  He also has a previous previous history of breast cancer, and anal cancer.     Past Medical History:  Diagnosis Date  . Anal cancer ([place]) 08/09/2012   Squamous cell  . Anxiety    PANIC ATTACKS  . Arthritis   . Breast cancer ([place]) 2009   ER+PR+HER-2+  . Ductal carcinoma ([place]) 02/2008    invasive   . Heart palpitations   . History of radiation therapy 09/02/12-10/10/12   anal 50.4Gy  . History of shingles 10/2014   . HPV (human papilloma virus) anogenital infection    vulvar/ freezing hx  . Lung nodule   . Malignant neoplasm of upper lobe of left lung ([place]) 06/2016  . Personal history of chemotherapy   . Personal history of radiation therapy 2010  . Pneumonia    NO RECENT PROBLEMS  . PONV (postoperative nausea and vomiting)  Hypotension  . Radiation 10/21/08-12/08/08   Right breast 6240 cGy  . Shingles 2017  . Status post chemotherapy 02/2008   Taxol/Herceptin     Social History   Tobacco Use  Smoking Status Former Smoker  . Years: 8.00  . Types: Cigarettes  . Last attempt to quit: 2015  . Years since quitting: 4.9  Smokeless Tobacco Never Used  Tobacco Comment   stopped smoking cigarettes Jan. 2018    Social History   Substance and Sexual Activity  Alcohol Use Yes   . Alcohol/week: 5.0 standard drinks  . Types: 5 Shots of liquor per week   Comment: ocasional glass of wine     Allergies  Allergen Reactions  . No Known Allergies     No current facility-administered medications for this encounter.        Physical Exam: BP (!) 110/54   Pulse 90   Temp 98.3 F (36.8 C) (Oral)   Resp 18   Ht 5' 5.5" (1.664 m)   Wt 64.9 kg   LMP 04/07/2008   SpO2 94%   BMI 23.43 kg/m  General appearance: alert and cooperative Head: Normocephalic, without obvious abnormality, atraumatic Neck: no adenopathy, no carotid bruit, no JVD, supple, symmetrical, trachea midline and thyroid not enlarged, symmetric, no tenderness/mass/nodules Lymph nodes: Cervical, supraclavicular, and axillary nodes normal. Resp: clear to auscultation bilaterally Back: symmetric, no curvature. ROM normal. No CVA tenderness. Cardio: regular rate and rhythm, S1, S2 normal, no murmur, click, rub or gallop GI: soft, non-tender; bowel sounds normal; no masses,  no organomegaly Extremities: extremities normal, atraumatic, no cyanosis or edema and Homans sign is negative, no sign of DVT Neurologic: Grossly normal Patient notes left eye was red yesterday, improved this am no obvious infection of lid or tear duct  iagnostic Studies & Laboratory data:     Recent Radiology Findings:   Ct Super D Chest W Contrast  Result Date: 04/22/2018 CLINICAL DATA:  57 year old female with history of right-sided breast cancer status post chemotherapy and radiation therapy. Follow-up study. EXAM: CT CHEST WITH CONTRAST TECHNIQUE: Multidetector CT imaging of the chest was performed using thin slice collimation for electromagnetic bronchoscopy planning purposes, with intravenous contrast. CONTRAST:  49m OMNIPAQUE IOHEXOL 300 MG/ML  SOLN COMPARISON:  Multiple priors, most recently PET-CT 01/09/2018. Chest CT 12/13/2017. FINDINGS: Cardiovascular: Heart size is normal. There is no significant pericardial  fluid, thickening or pericardial calcification. There is aortic atherosclerosis, as well as atherosclerosis of the great vessels of the mediastinum and the coronary arteries, including calcified atherosclerotic plaque in the left main, left anterior descending and right coronary arteries. Mediastinum/Nodes: No pathologically enlarged mediastinal or hilar lymph nodes. Esophagus is unremarkable in appearance. No axillary lymphadenopathy. Lungs/Pleura: Again noted are multiple pulmonary nodules scattered throughout the lungs bilaterally, many of which are solid, while others are sub solid. The largest of these nodules is predominantly solid in appearance in the posterior aspect of the right upper lobe (axial image 48 of series 7 and coronal image 75 of series 4) measuring 1.8 x 1.4 x 2.3 cm with internal air bronchograms, with macrolobulated and slightly spiculated margins (previously 11 x 16 mm on 12/13/2017). Another prominent nodule is predominantly solid in appearance in the anterior aspect of the right lower lobe abutting the major fissure (axial image 98 of series 7 and coronal image 69 of series 4) measuring 2.1 x 1.9 x 1.7 cm (previously 1.6 x 1.8 cm on 12/13/2017). Several other smaller lesions are noted throughout the lungs bilaterally,  similar in number to prior examinations. No definite new suspicious appearing pulmonary nodules or masses are noted. Postoperative changes of wedge resection are noted in the left upper lobe. No acute consolidative airspace disease. No pleural effusions. Mild diffuse bronchial wall thickening with mild paraseptal emphysema. Upper Abdomen: Unremarkable. Musculoskeletal: There are no aggressive appearing lytic or blastic lesions noted in the visualized portions of the skeleton. IMPRESSION: 1. Continued progression of multiple solid and subsolid nodules scattered throughout the lungs bilaterally, as above. Findings remain highly concerning for multicentric bronchogenic  adenocarcinoma. 2. Aortic atherosclerosis, in addition to left main and 2 vessel coronary artery disease. Please note that although the presence of coronary artery calcium documents the presence of coronary artery disease, the severity of this disease and any potential stenosis cannot be assessed on this non-gated CT examination. Assessment for potential risk factor modification, dietary therapy or pharmacologic therapy may be warranted, if clinically indicated. 3. Mild diffuse bronchial wall thickening with mild paraseptal emphysema; imaging findings suggestive of underlying COPD. Aortic Atherosclerosis (ICD10-I70.0) and Emphysema (ICD10-J43.9). Electronically Signed   By: Vinnie Langton M.D.   On: 04/22/2018 12:46   I have independently reviewed the above radiology studies  and reviewed the findings with the patient.    CLINICAL DATA:  Subsequent treatment strategy for enlarging right upper lobe nodule in this patient with history of anorectal, breast, and lung cancer.  EXAM: NUCLEAR MEDICINE PET SKULL BASE TO THIGH  TECHNIQUE: 7.6 mCi F-18 FDG was injected intravenously. Full-ring PET imaging was performed from the skull base to thigh after the radiotracer. CT data was obtained and used for attenuation correction and anatomic localization.  Fasting blood glucose: 115 mg/dl  COMPARISON:  PET-CT dated 06/22/2016 and CT chest from 12/13/2017  FINDINGS: Mediastinal blood pool activity: SUV max 2.1  NECK: Mostly symmetric activity along the tonsillar tissues, minimal asymmetric activity in the lateral portion of the right palatine tonsil with maximum SUV 5.7, contralateral side 3.8.  Incidental CT findings: Bilateral common carotid atherosclerotic calcification.  CHEST: The dominant 1.5 by 1.2 cm right upper lobe pulmonary nodule has a maximum SUV of 1.8. On the prior PET-CT from 06/22/2016 this nodule measured about 0.8 by 0.9 cm and had a maximum SUV of 1.0.  A right  lower lobe nodule posterior to the major fissure measures 1.2 cm in short axis and has a maximum SUV of 1.8, on the prior PET-CT this measured 0.9 cm in short axis and with maximum SUV of 1.4.  A 0.9 by 0.7 cm right upper lobe pulmonary nodule on image 35/8 has increased in size compared to the prior PET-CT but does not have a demonstrably accentuated metabolic activity.  Ground-glass density 1.3 by 0.8 cm nodule in the left lower lobe on image 45/8 is not currently appreciably hypermetabolic.  Postoperative findings from wedge resection in the left upper lobe, with resolution of the prior hypermetabolic activity in that vicinity.  A lower paratracheal node measuring 1.1 cm in short axis anterior to the carina on image 59/4 has a maximum SUV of 3.6. Previously on the prior PET-CT the maximum SUV was 3.5.  Incidental CT findings: Atherosclerotic calcification of the aortic arch and left anterior descending coronary artery. Paraseptal emphysema.  ABDOMEN/PELVIS: No significant abnormal hypermetabolic activity in this region.  Incidental CT findings: Aortoiliac atherosclerotic vascular disease. 2 mm left kidney lower pole nonobstructive renal calculus. Small lower periaortic and common iliac lymph nodes are not pathologically enlarged or hypermetabolic on today's PET/CT. Small uterine fibroid  along the posterior fundus.  SKELETON: Stable accentuated activity at the L5-S1 intervertebral level, likely inflammatory. No appreciable osseous metastatic disease.  Incidental CT findings: Levoconvex lumbar scoliosis.  IMPRESSION: 1. The enlarging 1.5 by 1.2 cm right upper lobe pulmonary nodule has a maximum SUV of 1.8. Back on 06/22/2016 this lesion measured 0.8 by 0.9 cm and had a maximum SUV of 1.0. The enlargement and progression in metabolic activity favors low-grade adenocarcinoma. 2. Similarly, a right lower lobe nodule posterior to the major fissure has mildly  enlarged and minimally increased in maximum SUV compared to the prior PET-CT of 06/12/2016, and could likewise represent low-grade adenocarcinoma. 3. A 8 mm right upper lobe pulmonary nodule on image 35/8 has increased in size compared to the prior PET-CT but does not currently have accentuated metabolic activity. Surveillance of this lesion and some of the other nodules such as the ground-glass density 1.3 by 0.8 cm non hypermetabolic lesion in the left lower lobe is suggested. 4. Stable activity in stable minimal enlargement of a lower paratracheal node anterior to the carina. 5. No findings of malignancy involving the abdomen/pelvis or skeleton. 6. Other imaging findings of potential clinical significance: Aortic Atherosclerosis (ICD10-I70.0). Coronary atherosclerosis. Nonobstructive left nephrolithiasis. Small uterine fibroids. Chronic inflammatory activity at L5-S1. Levoconvex lumbar scoliosis.   Electronically Signed   By: Van Clines M.D.   On: 01/09/2018 16:30   Ct Chest W Contrast  Result Date: 11/14/2016 CLINICAL DATA:  History of left lung cancer, follow-up right lower lobe nodule. History of right breast cancer and anal cancer. EXAM: CT CHEST WITH CONTRAST TECHNIQUE: Multidetector CT imaging of the chest was performed during intravenous contrast administration. CONTRAST:  62m ISOVUE-300 IOPAMIDOL (ISOVUE-300) INJECTION 61% COMPARISON:  PET-CT dated 06/22/2016 FINDINGS: Cardiovascular: Heart is normal in size.  No pericardial effusion. Coronary atherosclerosis of the LAD. Atherosclerotic calcifications of the aortic arch. No evidence of thoracic aortic aneurysm. Mediastinum/Nodes: Small mediastinal lymph nodes, including a 9 mm short axis low right paratracheal node with preservation of the normal fatty hilum. No suspicious axillary lymphadenopathy. Visualized thyroid is unremarkable. Lungs/Pleura: Mild biapical pleural-parenchymal scarring. Postsurgical changes related  to prior left upper lobe wedge resection. 14 x 16 mm irregular nodular opacity in the anterior right lower lobe (series 4/image 78), non FDG avid. Additional scattered nodules in the right lung measuring up to 9 mm in the medial right upper lobe (series 4/ image 39), non FDG avid although possibly beneath the size threshold for PET sensitivity. No focal consolidation. No pleural effusion or pneumothorax. Upper Abdomen: Visualized upper abdomen is unremarkable. Musculoskeletal: Visualized osseous structures are within normal limits. IMPRESSION: Status post left upper lobe wedge resection. Stable 14 x 16 mm irregular nodular opacity in the anterior right lower lobe, non FDG avid on prior PET. Additional scattered right lung nodules measuring up to 9 mm in the medial right upper lobe, unchanged. Aortic Atherosclerosis (ICD10-I70.0). Electronically Signed   By: SJulian HyM.D.   On: 11/14/2016 15:06     Previous CT of chest   IMPRESSION: 1. A posterior left upper lobe solid 11 mm nodule is suspicious for metastatic disease versus less likely attack metachronous primary bronchogenic carcinoma. Consider tissue sampling versus further evaluation with PET. 2. Other solid and sub solid pulmonary nodules are primarily similar. 10 mm left lower lobe and 9 mm right upper lobe sub solid nodules have enlarged. Apparent increase in size of a solid right lower lobe nodule, favored to be due to thinner slice collimation today.  3. No thoracic adenopathy. 4. Age advanced coronary artery atherosclerosis. Recommend assessment of coronary risk factors and consideration of medical therapy.  Recent Lab Findings: Lab Results  Component Value Date   WBC 5.0 05/03/2018   HGB 18.5 (H) 05/03/2018   HCT 55.3 (H) 05/03/2018   PLT 173 05/03/2018   GLUCOSE 111 (H) 05/03/2018   CHOL 224 (H) 04/07/2016   TRIG 66 04/07/2016   HDL 81 04/07/2016   LDLCALC 130 (H) 04/07/2016   ALT 18 05/03/2018   AST 25 05/03/2018     NA 140 05/03/2018   K 4.1 05/03/2018   CL 97 (L) 05/03/2018   CREATININE 0.62 05/03/2018   BUN 9 05/03/2018   CO2 31 05/03/2018   TSH 3.173 12/25/2014   INR 1.06 05/03/2018      Assessment / Plan:      Patient with progressive bilateral pulmonary groundglass semisolid nodules, after resection of adenosquamous carcinoma of the lung in February 2019, stage Ia with previous history of breast cancer and anal cancer.  With the progressive nature of the findings on CT scan I have recommended to the patient that we proceed with navigation bronchoscopy to pathologic evaluate the largest in the most progressive of these lesions primarily in the right lung.  In addition we can consider molecular testing on the pathologic material and/or the previously resected lesion in the left lung.   I discussed with the patient and her husband proceeding with bronchoscopy navigation bronchoscopy with lung biopsy possible placement of fiducials and EBUS  of the #7 lymph node which is very mildly enlarged.  The goals risks and alternatives of the planned surgical procedure Procedure(s): VIDEO BRONCHOSCOPY WITH ENDOBRONCHIAL NAVIGATION WITH BIOPSY (N/A) VIDEO BRONCHOSCOPY WITH ENDOBRONCHIAL ULTRASOUND (N/A) PLACEMENT OF FUDUCIAL (N/A)  have been discussed with the patient in detail. The risks of the procedure including death, infection, stroke, myocardial infarction, bleeding, blood transfusion have all been discussed specifically.  I have quoted Susann Givens Bucaro a 1 % of perioperative mortality and a complication rate as high as 10 %. The patient's questions have been answered.Hareem Surowiec Wilson is willing  to proceed with the planned procedure.   [name] MD      [address] [place] [address] Office [phone]   Beeper [phone]  05/03/2018 7:17 AM

## 2018-05-03 NOTE — Anesthesia Preprocedure Evaluation (Signed)
Anesthesia Evaluation   Patient awake    Reviewed: Allergy & Precautions, NPO status , Patient's Chart, lab work & pertinent test results  Airway Mallampati: II  TM Distance: >3 FB Neck ROM: Full    Dental  (+) Teeth Intact, Dental Advisory Given   Pulmonary former smoker,    breath sounds clear to auscultation       Cardiovascular  Rhythm:Regular Rate:Normal     Neuro/Psych    GI/Hepatic   Endo/Other    Renal/GU      Musculoskeletal   Abdominal   Peds  Hematology   Anesthesia Other Findings   Reproductive/Obstetrics                             Anesthesia Physical Anesthesia Plan  ASA: III  Anesthesia Plan: General   Post-op Pain Management:    Induction:   PONV Risk Score and Plan: Ondansetron and Dexamethasone  Airway Management Planned: Oral ETT  Additional Equipment:   Intra-op Plan:   Post-operative Plan: Extubation in OR  Informed Consent: I have reviewed the patients History and Physical, chart, labs and discussed the procedure including the risks, benefits and alternatives for the proposed anesthesia with the patient or authorized representative who has indicated his/her understanding and acceptance.   Dental advisory given  Plan Discussed with: CRNA and Anesthesiologist  Anesthesia Plan Comments:         Anesthesia Quick Evaluation

## 2018-05-03 NOTE — Anesthesia Postprocedure Evaluation (Signed)
Anesthesia Post Note  Patient: Gina Cervantes  Procedure(s) Performed: VIDEO BRONCHOSCOPY WITH ENDOBRONCHIAL NAVIGATION WITH BIOPSY (N/A ) VIDEO BRONCHOSCOPY WITH ENDOBRONCHIAL ULTRASOUND (N/A ) VIDEO BRONCHOSCOPY (N/A )     Patient location during evaluation: PACU Anesthesia Type: General Level of consciousness: awake and alert and sedated Pain management: pain level controlled Vital Signs Assessment: post-procedure vital signs reviewed and stable Respiratory status: spontaneous breathing, nonlabored ventilation, respiratory function stable and patient connected to nasal cannula oxygen Cardiovascular status: blood pressure returned to baseline and stable Postop Assessment: no apparent nausea or vomiting Anesthetic complications: no    Last Vitals:  Vitals:   05/03/18 1028 05/03/18 1057  BP: (!) 99/57 (!) 99/56  Pulse: 80 78  Resp: 17 14  Temp: 36.7 C 36.8 C  SpO2: 93% 93%    Last Pain:  Vitals:   05/03/18 1057  TempSrc:   PainSc: 0-No pain                 Vaneza Pickart COKER

## 2018-05-04 ENCOUNTER — Encounter (HOSPITAL_COMMUNITY): Payer: Self-pay | Admitting: Cardiothoracic Surgery

## 2018-05-08 DIAGNOSIS — Z923 Personal history of irradiation: Secondary | ICD-10-CM

## 2018-05-08 HISTORY — DX: Personal history of irradiation: Z92.3

## 2018-05-09 ENCOUNTER — Telehealth: Payer: Self-pay

## 2018-05-09 ENCOUNTER — Inpatient Hospital Stay: Payer: BLUE CROSS/BLUE SHIELD | Attending: Oncology | Admitting: Oncology

## 2018-05-09 VITALS — BP 125/62 | HR 75 | Temp 98.4°F | Resp 16 | Ht 65.5 in | Wt 148.1 lb

## 2018-05-09 DIAGNOSIS — C3412 Malignant neoplasm of upper lobe, left bronchus or lung: Secondary | ICD-10-CM

## 2018-05-09 DIAGNOSIS — C3411 Malignant neoplasm of upper lobe, right bronchus or lung: Secondary | ICD-10-CM

## 2018-05-09 DIAGNOSIS — Z923 Personal history of irradiation: Secondary | ICD-10-CM

## 2018-05-09 DIAGNOSIS — Z85048 Personal history of other malignant neoplasm of rectum, rectosigmoid junction, and anus: Secondary | ICD-10-CM

## 2018-05-09 DIAGNOSIS — Z9221 Personal history of antineoplastic chemotherapy: Secondary | ICD-10-CM

## 2018-05-09 DIAGNOSIS — Z170 Estrogen receptor positive status [ER+]: Secondary | ICD-10-CM

## 2018-05-09 DIAGNOSIS — Z853 Personal history of malignant neoplasm of breast: Secondary | ICD-10-CM

## 2018-05-09 NOTE — Op Note (Signed)
[name], Cervantes MEDICAL RECORD IN:[id number] ACCOUNT [id number] DATE OF BIRTH:[birth year] FACILITY: [facility] LOCATION: [facility] PHYSICIAN:Mckinnon Glick Maryruth Bun, MD  OPERATIVE REPORT  DATE OF PROCEDURE:  05/03/2018  PREOPERATIVE DIAGNOSIS:  Pulmonary nodules with history of lung cancer, breast cancer and anal cancer.  POSTOPERATIVE DIAGNOSIS:  Pulmonary nodules with history of lung cancer, breast cancer and anal cancer.  SURGICAL PROCEDURE PERFORMED:  Video bronchoscopy with endobronchial ultrasound and biopsy of #7 node.  Transbronchial navigation bronchoscopy with biopsy of right lower lobe nodule and attempted biopsy of right upper lobe nodule.  SURGEON:  [name], MD  BRIEF HISTORY:  The patient in February of 2019 and had undergone wedge resection and node sampling of a lesion in the left upper lobe.  This was shown to be a primary lung cancer, stage I with negative nodes.  She has been followed with serial CT scans  and a repeat PET scan in the fall of 2019.  There was mild uptake in the #7 node that had been slow growth of multiple pulmonary nodules, but the dominant ones and changed were in the right lower lobe and right upper lobe.  The site of the previous  resection remained unchanged.  There was a very small ground glass opacity in the left lower lobe.  With the patient's slow progression of multiple pulmonary nodules, repeat biopsy was recommended to her.  She agreed and signed informed consent.  DESCRIPTION OF PROCEDURE:  The patient underwent general endotracheal anesthesia without incident.  Appropriate timeout was performed and we first proceeded with video bronchoscopy to the subsegmental level both right and left tracheobronchial tree  without any significant endobronchial lesions noted.  The scope was then removed and an EBUS scope was placed through the endotracheal tube.  This gave visualization of an easily identified #7 lymph node.  Multiple passes with a  21-gauge aspirating  needle were obtained and reviewed by the on-site cytopathologist.  Lymphoid tissue was confirmed.  We then moved on with the previously planned navigation bronchoscopy, target in the right upper lobe and right lower lobe had previously been planned.  We  used the preop created Super D navigation pathway  for the right upper lobe target first.  The bronchoscope with the working channel and LG probe were appropriately registered and with the assistance of fluoroscopy, we made multiple attempts to navigate to the right upper lobe lesion, but  could never adequately come into a suitable distance for a biopsy.  We moved on to the second target in the right lower lobe and very easily navigated to the right lower lobe lesion within 0.8 cm.  Local registration was then again done with fluoroscopy.   We then did multiple biopsies with needle brush aspirating needle triple brush and small biopsy forceps intermittently replacing the LG probe to confirm our location and under fluoroscopy.  The initial quick stain confirmed lung tissue, but no  malignancy was immediately identified.  The remaining biopsies with the small biopsy forceps were submitted to pathology for examination.  The chest was examined with fluoroscopy with no evidence of a pneumothorax.  The patient was then awakened and  extubated in the operating room, having tolerated the procedure without obvious complication and was transferred to the recovery room for further postoperative observation with plan to discharge home when stable.  There was no blood loss.  TN/NUANCE  D:05/09/2018 T:05/09/2018 JOB:[id number]

## 2018-05-09 NOTE — Telephone Encounter (Signed)
Printed avs and calender of upcoming appointment. Per 1/2 los 

## 2018-05-09 NOTE — Progress Notes (Addendum)
[facility] OFFICE PROGRESS NOTE   Diagnosis: Non-small cell lung cancer, anal cancer, breast cancer  INTERVAL HISTORY:   Ms. Broman when a restaging CT of the chest 04/22/2018.  A few of the dominant lung nodules have increased in size, others are stable.  No new nodules. She saw Dr. Servando Snare she underwent a navigational bronchoscopy by Dr. Servando Snare 06/03/2017.  An FNA biopsy was performed of a level 7 lymph node.  The target lesion in the right upper lobe could not be accessed.  The second target in the right lower lobe was easily reached and multiple biopsies were obtained.  The cytology from the level 7 node returned negative.  The right lower lobe transbronchial biopsy returned negative.  Right lower lobe needle aspiration and brushings returned negative.  She reports feeling well.  No change over either breast.  No anal bleeding.  She reports intentional weight loss.  Objective:  Vital signs in last 24 hours:  Blood pressure 125/62, pulse 75, temperature 98.4 F (36.9 C), temperature source Oral, resp. rate 16, height 5' 5.5" (1.664 m), weight 148 lb 1.6 oz (67.2 kg), last menstrual period 04/07/2008, SpO2 92 %.    HEENT: Neck without mass Lymphatics: No cervical, supraclavicular, or inguinal nodes.?  Pea-sized high left axillary node Resp: Lungs clear bilaterally Cardio: Regular rate and rhythm GI: No hepatosplenomegaly, nontender Vascular: No leg edema   Lab Results:  Lab Results  Component Value Date   WBC 5.0 05/03/2018   HGB 18.5 (H) 05/03/2018   HCT 55.3 (H) 05/03/2018   MCV 104.1 (H) 05/03/2018   PLT 173 05/03/2018   NEUTROABS 2.6 01/02/2013    CMP  Lab Results  Component Value Date   NA 140 05/03/2018   K 4.1 05/03/2018   CL 97 (L) 05/03/2018   CO2 31 05/03/2018   GLUCOSE 111 (H) 05/03/2018   BUN 9 05/03/2018   CREATININE 0.62 05/03/2018   CALCIUM 9.2 05/03/2018   PROT 7.2 05/03/2018   ALBUMIN 3.8 05/03/2018   AST 25 05/03/2018   ALT 18 05/03/2018   ALKPHOS 59 05/03/2018   BILITOT 1.2 05/03/2018   GFRNONAA >60 05/03/2018   GFRAA >60 05/03/2018   Medications: I have reviewed the patient's current medications.   Assessment/Plan: 1.Anal cancer-left anal margin/anal canal mass, status post a biopsy on 08/09/2012 confirming invasive moderately differentiated squamous cell carcinoma,p16 positive.  -Staging PET scan 08/20/2012-hypermetabolic anal mass, no hypermetabolic metastases  -Initiation of concurrent radiation with cycle 1 of mitomycin C./5-fluorouracil 09/02/2012.  -She completed cycle 2 5-fluorouracil/mitomycin C. beginning 10/01/2012.  -She completed radiation 10/10/2012.  2. Right breast cancer 2009, ER positive, PR positive, HER-2 positive. She is status post neoadjuvant AC x4 cycles followed by Taxol/Herceptin. She underwent a right lumpectomy and sentinel lymph node biopsy 09/22/2008 with no evidence of residual invasive carcinoma and 5 negative sentinel lymph nodes. She then completed right breast radiation. She began tamoxifen 12/15/2008 and completed adjuvant Herceptin 05/25/2009. The tamoxifen was discontinued and she was switched to Arimidex in July 2013. Arimidex discontinued in mid July 2019. 3. History of enlargement of the right tonsil.  4. Nonspecific pulmonary nodules without hypermetabolic activity on the PET scan 08/20/2012. Chest CT 10/21/2012 with scattered pulmonary nodules including nodules that were not present in 2010. Annual surveillance was recommended. Follow-up chest CT 10/24/2013 with scattered groundglass densities again noted in both lungs mostly unchanged when compared to the previous study. A sub-solid right lower lobe nodule had increased central density. The size was  unchanged.The nodule has been present since 2009.  CT chest 06/13/2016-a sub-solid 9 mm right upper lobe nodule has enlarged compared to 2015, a right lower lobe irregular nodular density appears more well defined,  new vague 5 mm groundglass left upper lobe nodule, left lower lobe 10 mm groundglass nodule is more apparent, posterior left upper lobe circumscribed solid nodule measures 11 x 10 mm and is new  PET scan 06/22/2016-malignant range activity involving the 11 mm left upper lobe nodule, no other hypermetabolic nodules, suspicious sub-solid right lower lobe nodule  Wedge resection of a hypermetabolic left upper lobe nodule on 07/05/2016-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 No EGFR, ALK, BRAF, RET, ERBB2, or ROS1 alteration  CT chest 11/14/2016-status post left upper lobe wedge resection, stable right lung nodules  Chest CT 05/02/2017-stable bilateral solid and groundglass nodules, stable mild mediastinal lymphadenopathy  CT chest 12/13/2017- enlargement of right upper lobe nodule, other lung nodules and chest lymph node stable  PET 01/09/2018- right upper lobe nodule and right lower lobe nodule have enlarged since 2018 and have low level metabolic activity increased size of an 8 mm right upper lobe nodule without increased metabolic activity, stable activity and a mildly enlarged lower paratracheal node, no evidence of metastatic disease in the abdomen or pelvis  CT chest 04/22/2018- no new lung nodules, dominant right lung nodules slightly increased in size  Bronchoscopy/EBUS with biopsy of level 7 node, biopsy of right lower lobe nodule, FNA of right lower lobe nodule-negative   Disposition: Ms. Doke appears unchanged.  I discussed the pathology from the bronchoscopic biopsy with her.  I reviewed the most recent CT images with Ms. Doukas and her husband.  She understands the slowly enlarging lung nodules likely represent indolent primary lung cancers.  We discussed options including observation, a repeat biopsy attempt, surgical resection, and empiric radiation.  I favor  observation and a repeat CT at a 57-monthinterval.  It would be ideal to have a tissue diagnosis for molecular testing.  She is scheduled to see Dr. GServando Snarenext week.  She will return for an office visit here in 3 months.  25 minutes were spent with the patient today.  The majority of the time was used for counseling and coordination of care.  GBetsy Coder MD  05/09/2018  3:37 PM

## 2018-05-10 ENCOUNTER — Telehealth: Payer: Self-pay

## 2018-05-10 NOTE — Telephone Encounter (Signed)
Spoke to patient per Dr. Benay Spice notified her he would like to repeat her labs in a few weeks as her hemoglobin was high this time.  Could be related to smoking or COPD. Scheduling message has been sent.  Patient verbalized an understanding.

## 2018-05-14 ENCOUNTER — Encounter: Payer: Self-pay | Admitting: *Deleted

## 2018-05-14 ENCOUNTER — Telehealth: Payer: Self-pay | Admitting: Oncology

## 2018-05-14 ENCOUNTER — Ambulatory Visit: Payer: Self-pay | Admitting: Oncology

## 2018-05-14 NOTE — Progress Notes (Signed)
Oncology Nurse Navigator Documentation  Oncology Nurse Navigator Flowsheets 05/14/2018  Navigator Location [facility]  Navigator Encounter Type Other/per Dr. Benay Spice, I requested pathology from 07/05/16 [id number] to be sent for foundation one and PDL 1 testing.    Treatment Phase Abnormal Scans  Barriers/Navigation Needs Coordination of Care  Interventions Coordination of Care  Coordination of Care Other  Acuity Level 2  Time Spent with Patient 15

## 2018-05-14 NOTE — Telephone Encounter (Signed)
Called to confirm appt per 1/3 sch message - pt already aware of appt

## 2018-05-15 ENCOUNTER — Other Ambulatory Visit: Payer: Self-pay | Admitting: Cardiothoracic Surgery

## 2018-05-15 DIAGNOSIS — C3412 Malignant neoplasm of upper lobe, left bronchus or lung: Secondary | ICD-10-CM

## 2018-05-16 ENCOUNTER — Ambulatory Visit (INDEPENDENT_AMBULATORY_CARE_PROVIDER_SITE_OTHER): Payer: BLUE CROSS/BLUE SHIELD | Admitting: Cardiothoracic Surgery

## 2018-05-16 ENCOUNTER — Ambulatory Visit
Admission: RE | Admit: 2018-05-16 | Discharge: 2018-05-16 | Disposition: A | Discharge status: Home / Self Care | Payer: BLUE CROSS/BLUE SHIELD | Source: Ambulatory Visit | Attending: Cardiothoracic Surgery | Admitting: Cardiothoracic Surgery

## 2018-05-16 VITALS — BP 98/58 | HR 73 | Resp 20 | Ht 65.5 in | Wt 147.0 lb

## 2018-05-16 DIAGNOSIS — C3412 Malignant neoplasm of upper lobe, left bronchus or lung: Secondary | ICD-10-CM

## 2018-05-16 DIAGNOSIS — Z853 Personal history of malignant neoplasm of breast: Secondary | ICD-10-CM

## 2018-05-16 DIAGNOSIS — Z9889 Other specified postprocedural states: Secondary | ICD-10-CM | POA: Diagnosis not present

## 2018-05-16 DIAGNOSIS — R918 Other nonspecific abnormal finding of lung field: Secondary | ICD-10-CM | POA: Diagnosis not present

## 2018-05-16 DIAGNOSIS — C3410 Malignant neoplasm of upper lobe, unspecified bronchus or lung: Secondary | ICD-10-CM | POA: Diagnosis not present

## 2018-05-16 NOTE — Progress Notes (Signed)
[address]       [place] [address]             [phone]      Gina Cervantes Medical Record #[id number] Date of Birth: [birth year]  Referring: Ladell Pier, MD Primary Care: Gina Cervantes, No Pcp Per  Chief Complaint:   POST OP FOLLOW UP Cancer Staging Lung cancer, left  upper lobe [facility]) Staging form: Lung, AJCC 8th Edition - Pathologic stage from 07/07/2016: Stage IA2 (pT1b, pN0, cM0) - Signed by [name], MD on 07/07/2016  OPERATIVE REPORT DATE OF PROCEDURE:  05/03/2018 PREOPERATIVE DIAGNOSIS:  Pulmonary nodules with history of lung cancer, breast cancer and anal cancer. POSTOPERATIVE DIAGNOSIS:  Pulmonary nodules with history of lung cancer, breast cancer and anal cancer. SURGICAL PROCEDURE PERFORMED:  Video bronchoscopy with endobronchial ultrasound and biopsy of #7 node.  Transbronchial navigation bronchoscopy with biopsy of right lower lobe nodule and attempted biopsy of right upper lobe nodule.  07/05/2016   OPERATIVE REPORT PREOPERATIVE DIAGNOSIS:  Left upper lobe lung nodule with history of anal and breast cancer. PREOPERATIVE DIAGNOSIS:  Left upper lobe lung nodule with history of anal and breast cancer. SURGICAL PROCEDURE: 1. Bronchoscopy. 2. Left video-assisted thoracoscopy. 3. Wedge resection of left upper lobe mass. 4. Lymph node dissection and placement of On-Q. SURGEON:  [name], MD. Diagnosis 1. 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 - MARGINS ARE NEGATIVE. - SEE ONCOLOGY TEMPLATE. 2. Lymph node, biopsy, 10 L - ONE BENIGN LYMPH NODE WITH NO TUMOR SEEN (0/1). 3. Lymph node, biopsy, 12 L - ONE BENIGN LYMPH NODE WITH NO TUMOR SEEN (0/1). 4. Lymph node, biopsy, 11 L - ONE BENIGN LYMPH NODE WITH NO TUMOR SEEN (0/1). 5. Lymph node, biopsy, 11 L #2 - ONE BENIGN LYMPH NODE WITH NO TUMOR SEEN  (0/1). Microscopic Comment 1. LUNG Specimen, including laterality: Left upper lobe lung wedge with lymph nodes. Procedure: Left upper lobe lung wedge resection with lymph node dissection. Specimen integrity (intact/disrupted): Intact. Tumor site: Left upper lobe lung. Tumor focality: Unifocal. Maximum tumor size (cm): 1.5 cm. Histologic type: Adenosquamous carcinoma. Grade: Well differentiated (low grade). Margins: Negative. Distance to closest margin (cm): 0.5 cm. Visceral pleura invasion: Not identified. Tumor extension: Tumor confined to lung parenchyma. Treatment effect (if treated with neoadjuvant therapy): Not applicable. Lymph -Vascular invasion: Not identified. Lymph nodes: Number examined - 4 ; Number N1 nodes positive- 0 ; Number N2 nodes positive - 0.   History of Present Illness:     Has remained asymptomatic from a pulmonary standpoint.  She denies cough fever chills.  She has been followed by Dr. Benay Spice with serial CT scans and PET scan which have showed progressive bilateral enlargement of groundglass and semisolid nodules in both lungs.  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  He also has a previous previous history of breast cancer, and anal cancer.  Attempted navigation bronchoscopy did not reveal a definitive tissue diagnosis on the lesions in the right lung, lymphoid tissue was sampled at the #7 node and confirm but there was no malignancy noted there.   Past Medical History:  Diagnosis Date  . Anal cancer ([place]) 08/09/2012   Squamous cell  . Anxiety    PANIC ATTACKS  . Arthritis   . Breast cancer ([place])  2009   ER+PR+HER-2+  . Ductal carcinoma ([place]) 02/2008    invasive   . Heart palpitations   . History of radiation therapy 09/02/12-10/10/12   anal 50.4Gy  . History of shingles 10/2014   . HPV (human papilloma virus) anogenital infection    vulvar/ freezing hx  . Lung  nodule   . Malignant neoplasm of upper lobe of left lung ([place]) 06/2016  . Personal history of chemotherapy   . Personal history of radiation therapy 2010  . Pneumonia    NO RECENT PROBLEMS  . PONV (postoperative nausea and vomiting)    Hypotension  . Radiation 10/21/08-12/08/08   Right breast 6240 cGy  . Shingles 2017  . Status post chemotherapy 02/2008   Taxol/Herceptin     Social History   Tobacco Use  Smoking Status Former Smoker  . Years: 8.00  . Types: Cigarettes  . Last attempt to quit: 2015  . Years since quitting: 5.0  Smokeless Tobacco Never Used  Tobacco Comment   stopped smoking cigarettes Jan. 2018    Social History   Substance and Sexual Activity  Alcohol Use Yes  . Alcohol/week: 5.0 standard drinks  . Types: 5 Shots of liquor per week   Comment: ocasional glass of wine     Allergies  Allergen Reactions  . No Known Allergies     Current Outpatient Medications  Medication Sig Dispense Refill  . albuterol (PROVENTIL HFA;VENTOLIN HFA) 108 (90 Base) MCG/ACT inhaler Inhale 2 puffs into the lungs every 6 (six) hours as needed for wheezing or shortness of breath. 1 Inhaler 2  . CALCIUM PO Take 1 tablet by mouth daily.    Marland Kitchen loratadine (CLARITIN) 10 MG tablet Take 10 mg by mouth as needed for allergies.    Marland Kitchen LORazepam (ATIVAN) 1 MG tablet Take 1 tablet (1 mg total) by mouth every 8 (eight) hours. (Gina Cervantes taking differently: Take 1 mg by mouth every 8 (eight) hours as needed for anxiety. ) 30 tablet 0  . Multiple Vitamins-Minerals (MULTIVITAMIN WITH MINERALS) tablet Take 1 tablet by mouth daily.     No current facility-administered medications for this visit.        Physical Exam: BP (!) 98/58   Pulse 73   Resp 20   Ht 5' 5.5" (1.664 m)   Wt 147 lb (66.7 kg)   LMP 04/07/2008   SpO2 95% Comment: RA  BMI 24.09 kg/m  General appearance: alert, cooperative and no distress Head: Normocephalic, without obvious abnormality, atraumatic Neck: no  adenopathy, no carotid bruit, no JVD, supple, symmetrical, trachea midline and thyroid not enlarged, symmetric, no tenderness/mass/nodules Lymph nodes: Cervical, supraclavicular, and axillary nodes normal. Resp: clear to auscultation bilaterally Back: symmetric, no curvature. ROM normal. No CVA tenderness. Cardio: regular rate and rhythm, S1, S2 normal, no murmur, click, rub or gallop GI: soft, non-tender; bowel sounds normal; no masses,  no organomegaly Extremities: extremities normal, atraumatic, no cyanosis or edema and Homans sign is negative, no sign of DVT Neurologic: Grossly normal   iagnostic Studies & Laboratory data:     Recent Radiology Findings:   Ct Super D Chest W Contrast  Result Date: 04/22/2018 CLINICAL DATA:  58 year old female with history of right-sided breast cancer status post chemotherapy and radiation therapy. Follow-up study. EXAM: CT CHEST WITH CONTRAST TECHNIQUE: Multidetector CT imaging of the chest was performed using thin slice collimation for electromagnetic bronchoscopy planning purposes, with intravenous contrast. CONTRAST:  65m OMNIPAQUE IOHEXOL 300 MG/ML  SOLN COMPARISON:  Multiple priors,  most recently PET-CT 01/09/2018. Chest CT 12/13/2017. FINDINGS: Cardiovascular: Heart size is normal. There is no significant pericardial fluid, thickening or pericardial calcification. There is aortic atherosclerosis, as well as atherosclerosis of the great vessels of the mediastinum and the coronary arteries, including calcified atherosclerotic plaque in the left main, left anterior descending and right coronary arteries. Mediastinum/Nodes: No pathologically enlarged mediastinal or hilar lymph nodes. Esophagus is unremarkable in appearance. No axillary lymphadenopathy. Lungs/Pleura: Again noted are multiple pulmonary nodules scattered throughout the lungs bilaterally, many of which are solid, while others are sub solid. The largest of these nodules is predominantly solid in  appearance in the posterior aspect of the right upper lobe (axial image 48 of series 7 and coronal image 75 of series 4) measuring 1.8 x 1.4 x 2.3 cm with internal air bronchograms, with macrolobulated and slightly spiculated margins (previously 11 x 16 mm on 12/13/2017). Another prominent nodule is predominantly solid in appearance in the anterior aspect of the right lower lobe abutting the major fissure (axial image 98 of series 7 and coronal image 69 of series 4) measuring 2.1 x 1.9 x 1.7 cm (previously 1.6 x 1.8 cm on 12/13/2017). Several other smaller lesions are noted throughout the lungs bilaterally, similar in number to prior examinations. No definite new suspicious appearing pulmonary nodules or masses are noted. Postoperative changes of wedge resection are noted in the left upper lobe. No acute consolidative airspace disease. No pleural effusions. Mild diffuse bronchial wall thickening with mild paraseptal emphysema. Upper Abdomen: Unremarkable. Musculoskeletal: There are no aggressive appearing lytic or blastic lesions noted in the visualized portions of the skeleton. IMPRESSION: 1. Continued progression of multiple solid and subsolid nodules scattered throughout the lungs bilaterally, as above. Findings remain highly concerning for multicentric bronchogenic adenocarcinoma. 2. Aortic atherosclerosis, in addition to left main and 2 vessel coronary artery disease. Please note that although the presence of coronary artery calcium documents the presence of coronary artery disease, the severity of this disease and any potential stenosis cannot be assessed on this non-gated CT examination. Assessment for potential risk factor modification, dietary therapy or pharmacologic therapy may be warranted, if clinically indicated. 3. Mild diffuse bronchial wall thickening with mild paraseptal emphysema; imaging findings suggestive of underlying COPD. Aortic Atherosclerosis (ICD10-I70.0) and Emphysema (ICD10-J43.9).  Electronically Signed   By: Vinnie Langton M.D.   On: 04/22/2018 12:46   I have independently reviewed the above radiology studies  and reviewed the findings with the Gina Cervantes.    CLINICAL DATA:  Subsequent treatment strategy for enlarging right upper lobe nodule in this Gina Cervantes with history of anorectal, breast, and lung cancer.  EXAM: NUCLEAR MEDICINE PET SKULL BASE TO THIGH  TECHNIQUE: 7.6 mCi F-18 FDG was injected intravenously. Full-ring PET imaging was performed from the skull base to thigh after the radiotracer. CT data was obtained and used for attenuation correction and anatomic localization.  Fasting blood glucose: 115 mg/dl  COMPARISON:  PET-CT dated 06/22/2016 and CT chest from 12/13/2017  FINDINGS: Mediastinal blood pool activity: SUV max 2.1  NECK: Mostly symmetric activity along the tonsillar tissues, minimal asymmetric activity in the lateral portion of the right palatine tonsil with maximum SUV 5.7, contralateral side 3.8.  Incidental CT findings: Bilateral common carotid atherosclerotic calcification.  CHEST: The dominant 1.5 by 1.2 cm right upper lobe pulmonary nodule has a maximum SUV of 1.8. On the prior PET-CT from 06/22/2016 this nodule measured about 0.8 by 0.9 cm and had a maximum SUV of 1.0.  A right lower  lobe nodule posterior to the major fissure measures 1.2 cm in short axis and has a maximum SUV of 1.8, on the prior PET-CT this measured 0.9 cm in short axis and with maximum SUV of 1.4.  A 0.9 by 0.7 cm right upper lobe pulmonary nodule on image 35/8 has increased in size compared to the prior PET-CT but does not have a demonstrably accentuated metabolic activity.  Ground-glass density 1.3 by 0.8 cm nodule in the left lower lobe on image 45/8 is not currently appreciably hypermetabolic.  Postoperative findings from wedge resection in the left upper lobe, with resolution of the prior hypermetabolic activity in  that vicinity.  A lower paratracheal node measuring 1.1 cm in short axis anterior to the carina on image 59/4 has a maximum SUV of 3.6. Previously on the prior PET-CT the maximum SUV was 3.5.  Incidental CT findings: Atherosclerotic calcification of the aortic arch and left anterior descending coronary artery. Paraseptal emphysema.  ABDOMEN/PELVIS: No significant abnormal hypermetabolic activity in this region.  Incidental CT findings: Aortoiliac atherosclerotic vascular disease. 2 mm left kidney lower pole nonobstructive renal calculus. Small lower periaortic and common iliac lymph nodes are not pathologically enlarged or hypermetabolic on today's PET/CT. Small uterine fibroid along the posterior fundus.  SKELETON: Stable accentuated activity at the L5-S1 intervertebral level, likely inflammatory. No appreciable osseous metastatic disease.  Incidental CT findings: Levoconvex lumbar scoliosis.  IMPRESSION: 1. The enlarging 1.5 by 1.2 cm right upper lobe pulmonary nodule has a maximum SUV of 1.8. Back on 06/22/2016 this lesion measured 0.8 by 0.9 cm and had a maximum SUV of 1.0. The enlargement and progression in metabolic activity favors low-grade adenocarcinoma. 2. Similarly, a right lower lobe nodule posterior to the major fissure has mildly enlarged and minimally increased in maximum SUV compared to the prior PET-CT of 06/12/2016, and could likewise represent low-grade adenocarcinoma. 3. A 8 mm right upper lobe pulmonary nodule on image 35/8 has increased in size compared to the prior PET-CT but does not currently have accentuated metabolic activity. Surveillance of this lesion and some of the other nodules such as the ground-glass density 1.3 by 0.8 cm non hypermetabolic lesion in the left lower lobe is suggested. 4. Stable activity in stable minimal enlargement of a lower paratracheal node anterior to the carina. 5. No findings of malignancy involving the  abdomen/pelvis or skeleton. 6. Other imaging findings of potential clinical significance: Aortic Atherosclerosis (ICD10-I70.0). Coronary atherosclerosis. Nonobstructive left nephrolithiasis. Small uterine fibroids. Chronic inflammatory activity at L5-S1. Levoconvex lumbar scoliosis.   Electronically Signed   By: Van Clines M.D.   On: 01/09/2018 16:30   Ct Chest W Contrast  Result Date: 11/14/2016 CLINICAL DATA:  History of left lung cancer, follow-up right lower lobe nodule. History of right breast cancer and anal cancer. EXAM: CT CHEST WITH CONTRAST TECHNIQUE: Multidetector CT imaging of the chest was performed during intravenous contrast administration. CONTRAST:  26m ISOVUE-300 IOPAMIDOL (ISOVUE-300) INJECTION 61% COMPARISON:  PET-CT dated 06/22/2016 FINDINGS: Cardiovascular: Heart is normal in size.  No pericardial effusion. Coronary atherosclerosis of the LAD. Atherosclerotic calcifications of the aortic arch. No evidence of thoracic aortic aneurysm. Mediastinum/Nodes: Small mediastinal lymph nodes, including a 9 mm short axis low right paratracheal node with preservation of the normal fatty hilum. No suspicious axillary lymphadenopathy. Visualized thyroid is unremarkable. Lungs/Pleura: Mild biapical pleural-parenchymal scarring. Postsurgical changes related to prior left upper lobe wedge resection. 14 x 16 mm irregular nodular opacity in the anterior right lower lobe (series 4/image 78), non  FDG avid. Additional scattered nodules in the right lung measuring up to 9 mm in the medial right upper lobe (series 4/ image 39), non FDG avid although possibly beneath the size threshold for PET sensitivity. No focal consolidation. No pleural effusion or pneumothorax. Upper Abdomen: Visualized upper abdomen is unremarkable. Musculoskeletal: Visualized osseous structures are within normal limits. IMPRESSION: Status post left upper lobe wedge resection. Stable 14 x 16 mm irregular nodular opacity  in the anterior right lower lobe, non FDG avid on prior PET. Additional scattered right lung nodules measuring up to 9 mm in the medial right upper lobe, unchanged. Aortic Atherosclerosis (ICD10-I70.0). Electronically Signed   By: Julian Hy M.D.   On: 11/14/2016 15:06     Previous CT of chest   IMPRESSION: 1. A posterior left upper lobe solid 11 mm nodule is suspicious for metastatic disease versus less likely attack metachronous primary bronchogenic carcinoma. Consider tissue sampling versus further evaluation with PET. 2. Other solid and sub solid pulmonary nodules are primarily similar. 10 mm left lower lobe and 9 mm right upper lobe sub solid nodules have enlarged. Apparent increase in size of a solid right lower lobe nodule, favored to be due to thinner slice collimation today. 3. No thoracic adenopathy. 4. Age advanced coronary artery atherosclerosis. Recommend assessment of coronary risk factors and consideration of medical therapy.  Recent Lab Findings: Lab Results  Component Value Date   WBC 5.0 05/03/2018   HGB 18.5 (H) 05/03/2018   HCT 55.3 (H) 05/03/2018   PLT 173 05/03/2018   GLUCOSE 111 (H) 05/03/2018   CHOL 224 (H) 04/07/2016   TRIG 66 04/07/2016   HDL 81 04/07/2016   LDLCALC 130 (H) 04/07/2016   ALT 18 05/03/2018   AST 25 05/03/2018   NA 140 05/03/2018   K 4.1 05/03/2018   CL 97 (L) 05/03/2018   CREATININE 0.62 05/03/2018   BUN 9 05/03/2018   CO2 31 05/03/2018   TSH 3.173 12/25/2014   INR 1.06 05/03/2018      Assessment / Plan:      Gina Cervantes with progressive bilateral pulmonary groundglass semisolid nodules, after resection of adenosquamous carcinoma of the lung in February 2019, stage Ia with previous history of breast cancer and anal cancer.    Molecular testing on the previous sample resected from the left lung has been sent  I discussed with the Gina Cervantes attempting CT directed needle biopsy on the current lesions on the right versus  waiting 3 to 4 months and repeat a CT scan.  I suspect waiting 3 to 4 months with good results in the exact same discussion currently, maintain establish a tissue diagnosis abrasions on the right.  The Gina Cervantes will discuss with her husband although would like to proceed and call the office back in the next day or so.  If she wishes to proceed with needle biopsy will make arrangements with interventional radiology to review the films.  [name] MD      [address] [place] [address] Office [phone]   [place]  05/16/2018 11:21 AM

## 2018-05-22 ENCOUNTER — Other Ambulatory Visit: Payer: Self-pay | Admitting: Oncology

## 2018-05-24 ENCOUNTER — Other Ambulatory Visit: Payer: Self-pay | Admitting: Cardiothoracic Surgery

## 2018-05-24 DIAGNOSIS — R918 Other nonspecific abnormal finding of lung field: Secondary | ICD-10-CM

## 2018-05-27 DIAGNOSIS — C3410 Malignant neoplasm of upper lobe, unspecified bronchus or lung: Secondary | ICD-10-CM | POA: Diagnosis not present

## 2018-05-28 ENCOUNTER — Encounter (HOSPITAL_COMMUNITY): Payer: Self-pay | Admitting: Oncology

## 2018-05-29 ENCOUNTER — Other Ambulatory Visit: Payer: Self-pay | Admitting: *Deleted

## 2018-05-29 DIAGNOSIS — C3412 Malignant neoplasm of upper lobe, left bronchus or lung: Secondary | ICD-10-CM

## 2018-05-30 ENCOUNTER — Inpatient Hospital Stay: Payer: BLUE CROSS/BLUE SHIELD

## 2018-05-30 DIAGNOSIS — Z853 Personal history of malignant neoplasm of breast: Secondary | ICD-10-CM | POA: Diagnosis not present

## 2018-05-30 DIAGNOSIS — Z9221 Personal history of antineoplastic chemotherapy: Secondary | ICD-10-CM | POA: Diagnosis not present

## 2018-05-30 DIAGNOSIS — Z923 Personal history of irradiation: Secondary | ICD-10-CM | POA: Diagnosis not present

## 2018-05-30 DIAGNOSIS — Z170 Estrogen receptor positive status [ER+]: Secondary | ICD-10-CM | POA: Diagnosis not present

## 2018-05-30 DIAGNOSIS — C3411 Malignant neoplasm of upper lobe, right bronchus or lung: Secondary | ICD-10-CM | POA: Diagnosis not present

## 2018-05-30 DIAGNOSIS — C3412 Malignant neoplasm of upper lobe, left bronchus or lung: Secondary | ICD-10-CM

## 2018-05-30 DIAGNOSIS — Z85048 Personal history of other malignant neoplasm of rectum, rectosigmoid junction, and anus: Secondary | ICD-10-CM | POA: Diagnosis not present

## 2018-05-30 LAB — CBC WITH DIFFERENTIAL (CANCER CENTER ONLY)
Abs Immature Granulocytes: 0.01 10*3/uL (ref 0.00–0.07)
Basophils Absolute: 0.1 10*3/uL (ref 0.0–0.1)
Basophils Relative: 1 %
Eosinophils Absolute: 0.3 10*3/uL (ref 0.0–0.5)
Eosinophils Relative: 5 %
HCT: 48.1 % — ABNORMAL HIGH (ref 36.0–46.0)
Hemoglobin: 16.1 g/dL — ABNORMAL HIGH (ref 12.0–15.0)
Immature Granulocytes: 0 %
Lymphocytes Relative: 15 %
Lymphs Abs: 1 10*3/uL (ref 0.7–4.0)
MCH: 34.8 pg — ABNORMAL HIGH (ref 26.0–34.0)
MCHC: 33.5 g/dL (ref 30.0–36.0)
MCV: 103.9 fL — ABNORMAL HIGH (ref 80.0–100.0)
Monocytes Absolute: 0.7 10*3/uL (ref 0.1–1.0)
Monocytes Relative: 11 %
Neutro Abs: 4.7 10*3/uL (ref 1.7–7.7)
Neutrophils Relative %: 68 %
Platelet Count: 200 10*3/uL (ref 150–400)
RBC: 4.63 MIL/uL (ref 3.87–5.11)
RDW: 12.5 % (ref 11.5–15.5)
WBC Count: 6.9 10*3/uL (ref 4.0–10.5)
nRBC: 0 % (ref 0.0–0.2)

## 2018-05-31 ENCOUNTER — Telehealth: Payer: Self-pay | Admitting: *Deleted

## 2018-05-31 ENCOUNTER — Encounter: Payer: Self-pay | Admitting: *Deleted

## 2018-05-31 DIAGNOSIS — C3412 Malignant neoplasm of upper lobe, left bronchus or lung: Secondary | ICD-10-CM

## 2018-05-31 NOTE — Telephone Encounter (Signed)
Informed her of CBC results and MD wants to check again in 4-6 weeks. She reports that she is NOT smoking. Wanted to be sure Dr. Benay Spice was aware of her CT biopsy of lung lesion on 06/07/18. Scheduler will call for lab appointment.

## 2018-05-31 NOTE — Telephone Encounter (Signed)
-----   Message from Ladell Pier, MD sent at 05/31/2018  1:07 PM EST ----- Please call patient, hb is better, but still high, is she smoking?, follow for now, repeat cbc 4-6 weeks

## 2018-06-03 ENCOUNTER — Ambulatory Visit (HOSPITAL_COMMUNITY): Payer: BLUE CROSS/BLUE SHIELD

## 2018-06-04 ENCOUNTER — Telehealth: Payer: Self-pay | Admitting: Oncology

## 2018-06-04 NOTE — Telephone Encounter (Signed)
Scheduled appt per 1/24 sch message - unable to reach patient - left message with appt date and time

## 2018-06-05 ENCOUNTER — Other Ambulatory Visit: Payer: Self-pay | Admitting: Radiology

## 2018-06-06 ENCOUNTER — Other Ambulatory Visit: Payer: Self-pay | Admitting: Radiology

## 2018-06-07 ENCOUNTER — Encounter (HOSPITAL_COMMUNITY): Payer: Self-pay

## 2018-06-07 ENCOUNTER — Ambulatory Visit (HOSPITAL_COMMUNITY)
Admission: RE | Admit: 2018-06-07 | Discharge: 2018-06-07 | Disposition: A | Discharge status: Home / Self Care | Payer: BLUE CROSS/BLUE SHIELD | Source: Ambulatory Visit | Attending: Cardiothoracic Surgery | Admitting: Cardiothoracic Surgery

## 2018-06-07 ENCOUNTER — Ambulatory Visit (HOSPITAL_COMMUNITY)
Admission: RE | Admit: 2018-06-07 | Discharge: 2018-06-07 | Disposition: A | Discharge status: Home / Self Care | Payer: BLUE CROSS/BLUE SHIELD | Source: Ambulatory Visit | Attending: Interventional Radiology | Admitting: Interventional Radiology

## 2018-06-07 DIAGNOSIS — I2510 Atherosclerotic heart disease of native coronary artery without angina pectoris: Secondary | ICD-10-CM | POA: Insufficient documentation

## 2018-06-07 DIAGNOSIS — R911 Solitary pulmonary nodule: Secondary | ICD-10-CM | POA: Diagnosis not present

## 2018-06-07 DIAGNOSIS — I959 Hypotension, unspecified: Secondary | ICD-10-CM | POA: Diagnosis not present

## 2018-06-07 DIAGNOSIS — Z853 Personal history of malignant neoplasm of breast: Secondary | ICD-10-CM | POA: Diagnosis not present

## 2018-06-07 DIAGNOSIS — Z9889 Other specified postprocedural states: Secondary | ICD-10-CM | POA: Insufficient documentation

## 2018-06-07 DIAGNOSIS — Z801 Family history of malignant neoplasm of trachea, bronchus and lung: Secondary | ICD-10-CM | POA: Insufficient documentation

## 2018-06-07 DIAGNOSIS — F410 Panic disorder [episodic paroxysmal anxiety] without agoraphobia: Secondary | ICD-10-CM | POA: Insufficient documentation

## 2018-06-07 DIAGNOSIS — Z87891 Personal history of nicotine dependence: Secondary | ICD-10-CM | POA: Insufficient documentation

## 2018-06-07 DIAGNOSIS — F419 Anxiety disorder, unspecified: Secondary | ICD-10-CM | POA: Insufficient documentation

## 2018-06-07 DIAGNOSIS — R918 Other nonspecific abnormal finding of lung field: Secondary | ICD-10-CM

## 2018-06-07 DIAGNOSIS — Z79899 Other long term (current) drug therapy: Secondary | ICD-10-CM | POA: Insufficient documentation

## 2018-06-07 DIAGNOSIS — C3411 Malignant neoplasm of upper lobe, right bronchus or lung: Secondary | ICD-10-CM | POA: Diagnosis not present

## 2018-06-07 DIAGNOSIS — Z85048 Personal history of other malignant neoplasm of rectum, rectosigmoid junction, and anus: Secondary | ICD-10-CM | POA: Diagnosis not present

## 2018-06-07 DIAGNOSIS — Z85118 Personal history of other malignant neoplasm of bronchus and lung: Secondary | ICD-10-CM | POA: Diagnosis not present

## 2018-06-07 DIAGNOSIS — Z0389 Encounter for observation for other suspected diseases and conditions ruled out: Secondary | ICD-10-CM | POA: Diagnosis not present

## 2018-06-07 DIAGNOSIS — I700 Atherosclerosis of aorta: Secondary | ICD-10-CM | POA: Insufficient documentation

## 2018-06-07 LAB — CBC
HCT: 48.3 % — ABNORMAL HIGH (ref 36.0–46.0)
Hemoglobin: 16.3 g/dL — ABNORMAL HIGH (ref 12.0–15.0)
MCH: 34.7 pg — ABNORMAL HIGH (ref 26.0–34.0)
MCHC: 33.7 g/dL (ref 30.0–36.0)
MCV: 102.8 fL — ABNORMAL HIGH (ref 80.0–100.0)
Platelets: 214 10*3/uL (ref 150–400)
RBC: 4.7 MIL/uL (ref 3.87–5.11)
RDW: 12.4 % (ref 11.5–15.5)
WBC: 5.6 10*3/uL (ref 4.0–10.5)
nRBC: 0 % (ref 0.0–0.2)

## 2018-06-07 LAB — PROTIME-INR
INR: 1.04
Prothrombin Time: 13.5 seconds (ref 11.4–15.2)

## 2018-06-07 MED ORDER — MIDAZOLAM HCL 2 MG/2ML IJ SOLN
INTRAMUSCULAR | Status: AC | PRN
  Administered 2018-06-07: 1 mg via INTRAVENOUS
  Administered 2018-06-07: 0.5 mg via INTRAVENOUS

## 2018-06-07 MED ORDER — FENTANYL CITRATE (PF) 100 MCG/2ML IJ SOLN
INTRAMUSCULAR | Status: AC
  Filled 2018-06-07: qty 2

## 2018-06-07 MED ORDER — SODIUM CHLORIDE 0.9 % IV SOLN: INTRAVENOUS | Status: DC

## 2018-06-07 MED ORDER — MIDAZOLAM HCL 2 MG/2ML IJ SOLN
INTRAMUSCULAR | Status: AC
  Filled 2018-06-07: qty 2

## 2018-06-07 MED ORDER — FENTANYL CITRATE (PF) 100 MCG/2ML IJ SOLN
INTRAMUSCULAR | Status: AC | PRN
  Administered 2018-06-07: 50 ug via INTRAVENOUS
  Administered 2018-06-07: 25 ug via INTRAVENOUS

## 2018-06-07 MED ORDER — SODIUM CHLORIDE 0.9 % IV SOLN
INTRAVENOUS | Status: AC | PRN
  Administered 2018-06-07: 10 mL/h via INTRAVENOUS

## 2018-06-07 MED ORDER — LIDOCAINE HCL 1 % IJ SOLN
INTRAMUSCULAR | Status: AC
  Filled 2018-06-07: qty 20

## 2018-06-07 NOTE — Procedures (Signed)
RUL lung nodule Core Bx 18 g times three EBL 0 Comp 0

## 2018-06-07 NOTE — Sedation Documentation (Signed)
Patient denies pain and is resting comfortably. Occasional cough with small amount of hemoptysis. Slight pain with coughing only.

## 2018-06-07 NOTE — Sedation Documentation (Signed)
Patient is resting comfortably. 

## 2018-06-07 NOTE — H&P (Signed)
Chief Complaint: Patient was seen in consultation today for right lung lesion biopsy at the request of [facility]  Referring Physician(s): [name]  Supervising Physician: Marybelle Killings  Patient Status: [facility] - Out-pt  History of Present Illness: [facility] is a 58 y.o. female   Hx Lung Ca; Breast Ca; Anal Ca Follow up scans show enlarging nodules [facility] lungs Previous L wedge resection 2018  IMPRESSION: 1. Continued progression of multiple solid and subsolid nodules scattered throughout the lungs bilaterally, as above. Findings remain highly concerning for multicentric bronchogenic adenocarcinoma. 2. Aortic atherosclerosis, in addition to left main and 2 vessel coronary artery disease. Please note that although the presence of coronary artery calcium documents the presence of coronary artery disease, the severity of this disease and any potential stenosis cannot be assessed on this non-gated CT examination. Assessment for potential risk factor modification, dietary therapy [facility] pharmacologic therapy may be warranted, if clinically indicated. 3. Mild diffuse bronchial wall thickening with mild paraseptal emphysema; imaging findings suggestive of underlying COPD.  Bronchoscopy right lung 04/2018 did not get definitive tissue diagnosis  Scheduled now for RUL nodule biopsy  Past Medical History:  Diagnosis Date  . Anal cancer ([facility]) 08/09/2012   Squamous cell  . Anxiety    PANIC ATTACKS  . Arthritis   . Breast cancer ([facility]) 2009   ER+PR+HER-2+  . Ductal carcinoma ([facility]) 02/2008    invasive   . Heart palpitations   . History of radiation therapy 09/02/12-10/10/12   anal 50.4Gy  . History of shingles 10/2014   . HPV (human papilloma virus) anogenital infection    vulvar/ freezing hx  . Lung nodule   . Malignant neoplasm of upper lobe of left lung ([facility]) 06/2016  . Personal history of chemotherapy   . Personal history of radiation therapy 2010  . Pneumonia     NO RECENT PROBLEMS  . PONV (postoperative nausea and vomiting)    Hypotension  . Radiation 10/21/08-12/08/08   Right breast 6240 cGy  . Shingles 2017  . Status post chemotherapy 02/2008   Taxol/Herceptin    Past Surgical History:  Procedure Laterality Date  . BREAST BIOPSY Right 02/04/2008   malignant  . BREAST LUMPECTOMY Right 2010   malignant  . BREAST LUMPECTOMY WITH AXILLARY LYMPH NODE BIOPSY Right 5/10   Dr. Marlou Starks  . CESAREAN SECTION    . COLONOSCOPY W/ POLYPECTOMY    . EVALUATION UNDER ANESTHESIA WITH ANAL FISTULECTOMY N/A 08/09/2012   Procedure: EXAM UNDER ANESTHESIA AND BIOPSY OF ANAL MASS;  Surgeon: Odis Hollingshead, MD;  Location: [facility];  Service: General;  Laterality: N/A;  . KNEE ARTHROSCOPY Left    left knee  . PORTACATH PLACEMENT  03/2008   dr. Marlou Starks   . REMOVAL PORTACATH  2011  . VIDEO ASSISTED THORACOSCOPY (VATS)/WEDGE RESECTION Left 07/05/2016   Procedure: VIDEO ASSISTED THORACOSCOPY (VATS)/WEDGE RESECTION left upper lobe,  lymph node dissection and placement of OnQ catheter;  Surgeon: [name], MD;  Location: [facility];  Service: Thoracic;  Laterality: Left;  Marland Kitchen VIDEO BRONCHOSCOPY N/A 07/05/2016   Procedure: VIDEO BRONCHOSCOPY;  Surgeon: [name], MD;  Location: [facility];  Service: Thoracic;  Laterality: N/A;  . VIDEO BRONCHOSCOPY N/A 05/03/2018   Procedure: VIDEO BRONCHOSCOPY;  Surgeon: [name], MD;  Location: [facility];  Service: Thoracic;  Laterality: N/A;  . VIDEO BRONCHOSCOPY WITH ENDOBRONCHIAL NAVIGATION N/A 05/03/2018   Procedure: VIDEO BRONCHOSCOPY WITH ENDOBRONCHIAL NAVIGATION WITH BIOPSY;  Surgeon: [name]  B, MD;  Location: [facility];  Service: Thoracic;  Laterality: N/A;  . VIDEO BRONCHOSCOPY WITH ENDOBRONCHIAL ULTRASOUND N/A 05/03/2018   Procedure: VIDEO BRONCHOSCOPY WITH ENDOBRONCHIAL ULTRASOUND;  Surgeon: [name], MD;  Location: [facility];  Service: Thoracic;  Laterality: N/A;    Allergies: No known  allergies  Medications: Prior to Admission medications   Medication Sig Start Date End Date Taking? Authorizing Provider  CALCIUM PO Take 1 tablet by mouth daily.   Yes [provider]  loratadine (CLARITIN) 10 MG tablet Take 10 mg by mouth as needed for allergies.   Yes [provider]  LORazepam (ATIVAN) 1 MG tablet Take 1 tablet (1 mg total) by mouth every 8 (eight) hours. Patient taking differently: Take 1 mg by mouth every 8 (eight) hours as needed for anxiety.  11/29/17  Yes Megan Salon, MD  Multiple Vitamins-Minerals (MULTIVITAMIN WITH MINERALS) tablet Take 1 tablet by mouth daily.   Yes [provider]  VENTOLIN HFA 108 (90 Base) MCG/ACT inhaler TAKE 2 PUFFS BY MOUTH EVERY 6 HOURS AS NEEDED FOR WHEEZE [facility] SHORTNESS OF BREATH Patient taking differently: Inhale 2 puffs into the lungs every 6 (six) hours as needed for wheezing [facility] shortness of breath.  05/22/18  Yes Ladell Pier, MD     Family History  Problem Relation Age of Onset  . Lung cancer Father   . Stroke Mother   . Breast cancer Neg Hx     Social History   Socioeconomic History  . Marital status: Married    Spouse name: Not on file  . Number of children: Not on file  . Years of education: Not on file  . Highest education level: Not on file  Occupational History    Employer: [employer], [place], [place]    Comment: Realtor  Social Needs  . Financial resource strain: Not on file  . Food insecurity:    Worry: Not on file    Inability: Not on file  . Transportation needs:    Medical: Not on file    Non-medical: Not on file  Tobacco Use  . Smoking status: Former Smoker    Years: 8.00    Types: Cigarettes    Last attempt to quit: 2015    Years since quitting: 5.0  . Smokeless tobacco: Never Used  . Tobacco comment: stopped smoking cigarettes Jan. 2018  Substance and Sexual Activity  . Alcohol use: Yes    Alcohol/week: 5.0 standard drinks    Types: 5 Shots of liquor per  week    Comment: ocasional glass of wine  . Drug use: No  . Sexual activity: Yes    Partners: Male    Birth control/protection: Other-see comments    Comment: vasectomy  Lifestyle  . Physical activity:    Days per week: Not on file    Minutes per session: Not on file  . Stress: Not on file  Relationships  . Social connections:    Talks on phone: Not on file    Gets together: Not on file    Attends religious service: Not on file    Active member of club [facility] organization: Not on file    Attends meetings of clubs [facility] organizations: Not on file    Relationship status: Not on file  Other Topics Concern  . Not on file  Social History Narrative   Married   Lost a son age 2 this year and husband's brother, still going through grieving process.  No family history of breast [facility] ovarian cancer.   Employed as Realtor    Review of Systems: A 12 point ROS discussed and pertinent positives are indicated in the HPI above.  All other systems are negative.  Review of Systems  Constitutional: Negative for activity change, fatigue and fever.  Respiratory: Negative for cough and shortness of breath.   Cardiovascular: Negative for chest pain.  Gastrointestinal: Negative for abdominal pain.  Musculoskeletal: Negative for back pain.  Neurological: Negative for weakness.  Psychiatric/Behavioral: Negative for behavioral problems and confusion.    Vital Signs: BP (!) 86/69   Pulse 86   Temp 98.3 F (36.8 C) (Oral)   Resp 18   Ht '5\' 6"'  (1.676 m)   Wt 144 lb (65.3 kg)   LMP 04/07/2008   SpO2 93%   BMI 23.24 kg/m   Physical Exam Vitals signs reviewed.  Constitutional:      Appearance: Normal appearance.  Cardiovascular:     Rate and Rhythm: Normal rate and regular rhythm.     Heart sounds: Normal heart sounds.  Pulmonary:     Effort: Pulmonary effort is normal.     Breath sounds: Normal breath sounds.  Abdominal:     Palpations: Abdomen is soft.     Tenderness: There is no  abdominal tenderness.  Musculoskeletal: Normal range of motion.  Skin:    General: Skin is warm and dry.  Neurological:     General: No focal deficit present.     Mental Status: She is alert and oriented to person, place, and time.  Psychiatric:        Mood and Affect: Mood normal.        Behavior: Behavior normal.        Thought Content: Thought content normal.        Judgment: Judgment normal.     Imaging: Dg Chest 2 View  Result Date: 05/16/2018 CLINICAL DATA:  History of left lung carcinoma with surgery on 07/05/2016. Follow-up EXAM: CHEST - 2 VIEW COMPARISON:  Chest x-ray of 05/03/2018 FINDINGS: Linear sutures are noted in the left upper lung from prior left lung surgery. The lungs appear clear and well aerated otherwise. No pleural effusion is seen. Mediastinal and hilar contours are unremarkable and the heart is within normal limits in size. No bony abnormality is seen. IMPRESSION: Postoperative changes in the left upper lung. No active lung disease. Electronically Signed   By: Ivar Drape M.D.   On: 05/16/2018 10:23    Labs:  CBC: Recent Labs    05/03/18 0631 05/30/18 1516  WBC 5.0 6.9  HGB 18.5* 16.1*  HCT 55.3* 48.1*  PLT 173 200    COAGS: Recent Labs    05/03/18 0631  INR 1.06  APTT 26    BMP: Recent Labs    05/03/18 0631  NA 140  K 4.1  CL 97*  CO2 31  GLUCOSE 111*  BUN 9  CALCIUM 9.2  CREATININE 0.62  GFRNONAA >60  GFRAA >60    LIVER FUNCTION TESTS: Recent Labs    05/03/18 0631  BILITOT 1.2  AST 25  ALT 18  ALKPHOS 59  PROT 7.2  ALBUMIN 3.8    TUMOR MARKERS: No results for input(s): AFPTM, CEA, CA199, CHROMGRNA in the last 8760 hours.  Assessment and Plan:  Hx Lung Ca; breast ca; anal ca Left lung wedge resection 2018 Follow up scans reveal enlarging lesions [facility] lungs Bronch right nodule- not definitive  Now for needle biopsy of  RUL lesion Risks and benefits discussed with the patient including, but not limited to bleeding,  hemoptysis, respiratory failure requiring intubation, infection, pneumothorax requiring chest tube placement, stroke from air embolism [facility] even death.  All of the patient's questions were answered, patient is agreeable to proceed. Consent signed and in chart.   Thank you for this interesting consult.  I greatly enjoyed meeting [facility]LLO and look forward to participating in their care.  A copy of this report was sent to the requesting provider on this date.  Electronically Signed: Lavonia Drafts, PA-C 06/07/2018, 9:45 AM   I spent a total of  30 Minutes   in face to face in clinical consultation, greater than 50% of which was counseling/coordinating care for right lung nodule bx

## 2018-06-07 NOTE — Sedation Documentation (Signed)
Patient denies pain and is resting comfortably.  

## 2018-06-07 NOTE — Sedation Documentation (Signed)
Patient denies pain and is resting comfortably. Coughing a little now with small amounts of hemoptysis. Dr Barbie Banner aware.

## 2018-06-07 NOTE — Discharge Instructions (Signed)
Needle Biopsy, Care After °These instructions tell you how to care for yourself after your procedure. Your doctor may also give you more specific instructions. Call your doctor if you have any problems or questions. °What can I expect after the procedure? °After the procedure, it is common to have: °· Soreness. °· Bruising. °· Mild pain. °Follow these instructions at home: ° °· Return to your normal activities as told by your doctor. Ask your doctor what activities are safe for you. °· Take over-the-counter and prescription medicines only as told by your doctor. °· Wash your hands with soap and water before you change your bandage (dressing). If you cannot use soap and water, use hand sanitizer. °· Follow instructions from your doctor about: °? How to take care of your puncture site. °? When and how to change your bandage. °? When to remove your bandage. °· Check your puncture site every day for signs of infection. Watch for: °? Redness, swelling, or pain. °? Fluid or blood.  °? Pus or a bad smell. °? Warmth. °· Do not take baths, swim, or use a hot tub until your doctor approves. Ask your doctor if you may take showers. You may only be allowed to take sponge baths. °· Keep all follow-up visits as told by your doctor. This is important. °Contact a doctor if you have: °· A fever. °· Redness, swelling, or pain at the puncture site, and it lasts longer than a few days. °· Fluid, blood, or pus coming from the puncture site. °· Warmth coming from the puncture site. °Get help right away if: °· You have a lot of bleeding from the puncture site. °Summary °· After the procedure, it is common to have soreness, bruising, or mild pain at the puncture site. °· Check your puncture site every day for signs of infection, such as redness, swelling, or pain. °· Get help right away if you have severe bleeding from your puncture site. °This information is not intended to replace advice given to you by your health care provider. Make  sure you discuss any questions you have with your health care provider. °Document Released: 04/06/2008 Document Revised: 05/07/2017 Document Reviewed: 05/07/2017 °Elsevier Interactive Patient Education © 2019 [employer] ° °

## 2018-06-12 ENCOUNTER — Ambulatory Visit: Payer: BLUE CROSS/BLUE SHIELD | Admitting: Cardiothoracic Surgery

## 2018-06-13 ENCOUNTER — Ambulatory Visit (INDEPENDENT_AMBULATORY_CARE_PROVIDER_SITE_OTHER): Payer: BLUE CROSS/BLUE SHIELD | Admitting: Cardiothoracic Surgery

## 2018-06-13 ENCOUNTER — Telehealth: Payer: Self-pay | Admitting: *Deleted

## 2018-06-13 ENCOUNTER — Encounter: Payer: Self-pay | Admitting: *Deleted

## 2018-06-13 VITALS — BP 102/76 | HR 90 | Resp 20 | Ht 65.5 in | Wt 143.0 lb

## 2018-06-13 DIAGNOSIS — Z853 Personal history of malignant neoplasm of breast: Secondary | ICD-10-CM

## 2018-06-13 DIAGNOSIS — C3412 Malignant neoplasm of upper lobe, left bronchus or lung: Secondary | ICD-10-CM | POA: Diagnosis not present

## 2018-06-13 DIAGNOSIS — Z9889 Other specified postprocedural states: Secondary | ICD-10-CM

## 2018-06-13 NOTE — Telephone Encounter (Signed)
Oncology Nurse Navigator Documentation  Oncology Nurse Navigator Flowsheets 06/13/2018  Navigator Location [facility]  Navigator Encounter Type Telephone/per Dr. Benay Spice, I called Ms. Ornstein to update on referral Rad Onc.  Dr. Servando Snare called and spoke with Dr. Benay Spice and apparently patient is having a denial claim from her insurance on [employer] testing.  I called but was unable to reach.  I did leave a vm message of referral to [facility] and she will get a call and I will update [employer] on denial claim.  They will contact insurance on patient's behave.    Telephone Outgoing Call  Treatment Phase Pre-Tx/Tx Discussion  Barriers/Navigation Needs Education;Coordination of Care  Education Other  Interventions Coordination of Care;Education  Coordination of Care Other  Education Method Verbal  Acuity Level 1  Time Spent with Patient 15

## 2018-06-13 NOTE — Progress Notes (Signed)
[address]       [place] [address]             [phone]      Gina Cervantes [facility] Record #[id number] Date of Birth: Aug 13, 1960  Referring: Gina Pier, Cervantes Primary Care: Patient, No Pcp Per  Chief Complaint:   POST OP FOLLOW UP Cancer Staging Lung cancer, left  upper lobe [facility]) Staging form: Lung, AJCC 8th Edition - Pathologic stage from 07/07/2016: Stage IA2 (pT1b, pN0, cM0) - Signed by Gina Isaac, Cervantes on 07/07/2016  OPERATIVE REPORT DATE OF PROCEDURE:  05/03/2018 PREOPERATIVE DIAGNOSIS:  Pulmonary nodules with history of lung cancer, breast cancer and anal cancer. POSTOPERATIVE DIAGNOSIS:  Pulmonary nodules with history of lung cancer, breast cancer and anal cancer. SURGICAL PROCEDURE PERFORMED:  Video bronchoscopy with endobronchial ultrasound and biopsy of #7 node.  Transbronchial navigation bronchoscopy with biopsy of right lower lobe nodule and attempted biopsy of right upper lobe nodule.  07/05/2016   OPERATIVE REPORT PREOPERATIVE DIAGNOSIS:  Left upper lobe lung nodule with history of anal and breast cancer. PREOPERATIVE DIAGNOSIS:  Left upper lobe lung nodule with history of anal and breast cancer. SURGICAL PROCEDURE: 1. Bronchoscopy. 2. Left video-assisted thoracoscopy. 3. Wedge resection of left upper lobe mass. 4. Lymph node dissection and placement of On-Q. SURGEON:  Gina Cervantes. Diagnosis 1. 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 - MARGINS ARE NEGATIVE. - SEE ONCOLOGY TEMPLATE. 2. Lymph node, biopsy, 10 L - ONE BENIGN LYMPH NODE WITH NO TUMOR SEEN (0/1). 3. Lymph node, biopsy, 12 L - ONE BENIGN LYMPH NODE WITH NO TUMOR SEEN (0/1). 4. Lymph node, biopsy, 11 L - ONE BENIGN LYMPH NODE WITH NO TUMOR SEEN (0/1). 5. Lymph node, biopsy, 11 L #2 - ONE BENIGN LYMPH NODE WITH NO TUMOR SEEN  (0/1). Microscopic Comment 1. LUNG Specimen, including laterality: Left upper lobe lung wedge with lymph nodes. Procedure: Left upper lobe lung wedge resection with lymph node dissection. Specimen integrity (intact/disrupted): Intact. Tumor site: Left upper lobe lung. Tumor focality: Unifocal. Maximum tumor size (cm): 1.5 cm. Histologic type: Adenosquamous carcinoma. Grade: Well differentiated (low grade). Margins: Negative. Distance to closest margin (cm): 0.5 cm. Visceral pleura invasion: Not identified. Tumor extension: Tumor confined to lung parenchyma. Treatment effect (if treated with neoadjuvant therapy): Not applicable. Lymph -Vascular invasion: Not identified. Lymph nodes: Number examined - 4 ; Number N1 nodes positive- 0 ; Number N2 nodes positive - 0.   History of Present Illness:     Has remained asymptomatic from a pulmonary standpoint.  She denies cough fever chills.  She has been followed by Gina. Benay Cervantes with serial CT scans and PET scan which have showed progressive bilateral enlargement of groundglass and semisolid nodules in both lungs.  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  He also has a previous previous history of breast cancer, and anal cancer.  12/27 Attempted navigation bronchoscopy did not reveal a definitive tissue diagnosis on the lesions in the right lung, lymphoid tissue was sampled at the #7 node and confirm but there was no malignancy noted there.  Subsequent CT directed needle biopsy to the right upper lobe lesion confirms:  Diagnosis Lung, needle/core biopsy(ies), Right Upper Lobe - ADENOCARCINOMA WITH LEPIDIC AND ACINAR PATTERNS. SEE NOTE. Diagnosis Note Gina Cervantes  has reviewed this case and concurs with the above interpretation. Gina Cervantes was notified on 06/10/2018. (NK:ecj 06/10/2018) Gina Cervantes    Past Medical History:  Diagnosis Date  . Anal cancer  ([place]) 08/09/2012   Squamous cell  . Anxiety    PANIC ATTACKS  . Arthritis   . Breast cancer ([place]) 2009   ER+PR+HER-2+  . Ductal carcinoma ([place]) 02/2008    invasive   . Heart palpitations   . History of radiation therapy 09/02/12-10/10/12   anal 50.4Gy  . History of shingles 10/2014   . HPV (human papilloma virus) anogenital infection    vulvar/ freezing hx  . Lung nodule   . Malignant neoplasm of upper lobe of left lung ([place]) 06/2016  . Personal history of chemotherapy   . Personal history of radiation therapy 2010  . Pneumonia    NO RECENT PROBLEMS  . PONV (postoperative nausea and vomiting)    Hypotension  . Radiation 10/21/08-12/08/08   Right breast 6240 cGy  . Shingles 2017  . Status post chemotherapy 02/2008   Taxol/Herceptin     Social History   Tobacco Use  Smoking Status Former Smoker  . Years: 8.00  . Types: Cigarettes  . Last attempt to quit: 2015  . Years since quitting: 5.1  Smokeless Tobacco Never Used  Tobacco Comment   stopped smoking cigarettes Jan. 2018    Social History   Substance and Sexual Activity  Alcohol Use Yes  . Alcohol/week: 5.0 standard drinks  . Types: 5 Shots of liquor per week   Comment: ocasional glass of wine     Allergies  Allergen Reactions  . No Known Allergies     Current Outpatient Medications  Medication Sig Dispense Refill  . CALCIUM PO Take 1 tablet by mouth daily.    Marland Kitchen loratadine (CLARITIN) 10 MG tablet Take 10 mg by mouth as needed for allergies.    Marland Kitchen LORazepam (ATIVAN) 1 MG tablet Take 1 tablet (1 mg total) by mouth every 8 (eight) hours. (Patient taking differently: Take 1 mg by mouth every 8 (eight) hours as needed for anxiety. ) 30 tablet 0  . Multiple Vitamins-Minerals (MULTIVITAMIN WITH MINERALS) tablet Take 1 tablet by mouth daily.    . VENTOLIN HFA 108 (90 Base) MCG/ACT inhaler TAKE 2 PUFFS BY MOUTH EVERY 6 HOURS AS NEEDED FOR WHEEZE OR SHORTNESS OF BREATH (Patient taking differently: Inhale 2 puffs into the  lungs every 6 (six) hours as needed for wheezing or shortness of breath. ) 18 Inhaler 2   No current facility-administered medications for this visit.        Physical Exam: BP 102/76   Pulse 90   Resp 20   Ht 5' 5.5" (1.664 m)   Wt 143 lb (64.9 kg)   LMP 04/07/2008   SpO2 93% Comment: RA  BMI 23.43 kg/m  General appearance: alert, cooperative and appears stated age Head: Normocephalic, without obvious abnormality, atraumatic Lymph nodes: Cervical, supraclavicular, and axillary nodes normal. Resp: clear to auscultation bilaterally Back: symmetric, no curvature. ROM normal. No CVA tenderness. Cardio: regular rate and rhythm, S1, S2 normal, no murmur, click, rub or gallop GI: soft, non-tender; bowel sounds normal; no masses,  no organomegaly Extremities: extremities normal, atraumatic, no cyanosis or edema and Homans sign is negative, no sign of DVT Neurologic: Grossly normal    iagnostic Studies & Laboratory data:     Recent Radiology Findings:   Ct Super D Chest W Contrast  Result Date: 04/22/2018 CLINICAL  DATA:  58 year old female with history of right-sided breast cancer status post chemotherapy and radiation therapy. Follow-up study. EXAM: CT CHEST WITH CONTRAST TECHNIQUE: Multidetector CT imaging of the chest was performed using thin slice collimation for electromagnetic bronchoscopy planning purposes, with intravenous contrast. CONTRAST:  35m OMNIPAQUE IOHEXOL 300 MG/ML  SOLN COMPARISON:  Multiple priors, most recently PET-CT 01/09/2018. Chest CT 12/13/2017. FINDINGS: Cardiovascular: Heart size is normal. There is no significant pericardial fluid, thickening or pericardial calcification. There is aortic atherosclerosis, as well as atherosclerosis of the great vessels of the mediastinum and the coronary arteries, including calcified atherosclerotic plaque in the left main, left anterior descending and right coronary arteries. Mediastinum/Nodes: No pathologically enlarged  mediastinal or hilar lymph nodes. Esophagus is unremarkable in appearance. No axillary lymphadenopathy. Lungs/Pleura: Again noted are multiple pulmonary nodules scattered throughout the lungs bilaterally, many of which are solid, while others are sub solid. The largest of these nodules is predominantly solid in appearance in the posterior aspect of the right upper lobe (axial image 48 of series 7 and coronal image 75 of series 4) measuring 1.8 x 1.4 x 2.3 cm with internal air bronchograms, with macrolobulated and slightly spiculated margins (previously 11 x 16 mm on 12/13/2017). Another prominent nodule is predominantly solid in appearance in the anterior aspect of the right lower lobe abutting the major fissure (axial image 98 of series 7 and coronal image 69 of series 4) measuring 2.1 x 1.9 x 1.7 cm (previously 1.6 x 1.8 cm on 12/13/2017). Several other smaller lesions are noted throughout the lungs bilaterally, similar in number to prior examinations. No definite new suspicious appearing pulmonary nodules or masses are noted. Postoperative changes of wedge resection are noted in the left upper lobe. No acute consolidative airspace disease. No pleural effusions. Mild diffuse bronchial wall thickening with mild paraseptal emphysema. Upper Abdomen: Unremarkable. Musculoskeletal: There are no aggressive appearing lytic or blastic lesions noted in the visualized portions of the skeleton. IMPRESSION: 1. Continued progression of multiple solid and subsolid nodules scattered throughout the lungs bilaterally, as above. Findings remain highly concerning for multicentric bronchogenic adenocarcinoma. 2. Aortic atherosclerosis, in addition to left main and 2 vessel coronary artery disease. Please note that although the presence of coronary artery calcium documents the presence of coronary artery disease, the severity of this disease and any potential stenosis cannot be assessed on this non-gated CT examination. Assessment for  potential risk factor modification, dietary therapy or pharmacologic therapy may be warranted, if clinically indicated. 3. Mild diffuse bronchial wall thickening with mild paraseptal emphysema; imaging findings suggestive of underlying COPD. Aortic Atherosclerosis (ICD10-I70.0) and Emphysema (ICD10-J43.9). Electronically Signed   By: DVinnie LangtonM.D.   On: 04/22/2018 12:46   I have independently reviewed the above radiology studies  and reviewed the findings with the patient.    CLINICAL DATA:  Subsequent treatment strategy for enlarging right upper lobe nodule in this patient with history of anorectal, breast, and lung cancer.  EXAM: NUCLEAR MEDICINE PET SKULL BASE TO THIGH  TECHNIQUE: 7.6 mCi F-18 FDG was injected intravenously. Full-ring PET imaging was performed from the skull base to thigh after the radiotracer. CT data was obtained and used for attenuation correction and anatomic localization.  Fasting blood glucose: 115 mg/dl  COMPARISON:  PET-CT dated 06/22/2016 and CT chest from 12/13/2017  FINDINGS: Mediastinal blood pool activity: SUV max 2.1  NECK: Mostly symmetric activity along the tonsillar tissues, minimal asymmetric activity in the lateral portion of the right palatine tonsil with maximum SUV  5.7, contralateral side 3.8.  Incidental CT findings: Bilateral common carotid atherosclerotic calcification.  CHEST: The dominant 1.5 by 1.2 cm right upper lobe pulmonary nodule has a maximum SUV of 1.8. On the prior PET-CT from 06/22/2016 this nodule measured about 0.8 by 0.9 cm and had a maximum SUV of 1.0.  A right lower lobe nodule posterior to the major fissure measures 1.2 cm in short axis and has a maximum SUV of 1.8, on the prior PET-CT this measured 0.9 cm in short axis and with maximum SUV of 1.4.  A 0.9 by 0.7 cm right upper lobe pulmonary nodule on image 35/8 has increased in size compared to the prior PET-CT but does not have  a demonstrably accentuated metabolic activity.  Ground-glass density 1.3 by 0.8 cm nodule in the left lower lobe on image 45/8 is not currently appreciably hypermetabolic.  Postoperative findings from wedge resection in the left upper lobe, with resolution of the prior hypermetabolic activity in that vicinity.  A lower paratracheal node measuring 1.1 cm in short axis anterior to the carina on image 59/4 has a maximum SUV of 3.6. Previously on the prior PET-CT the maximum SUV was 3.5.  Incidental CT findings: Atherosclerotic calcification of the aortic arch and left anterior descending coronary artery. Paraseptal emphysema.  ABDOMEN/PELVIS: No significant abnormal hypermetabolic activity in this region.  Incidental CT findings: Aortoiliac atherosclerotic vascular disease. 2 mm left kidney lower pole nonobstructive renal calculus. Small lower periaortic and common iliac lymph nodes are not pathologically enlarged or hypermetabolic on today's PET/CT. Small uterine fibroid along the posterior fundus.  SKELETON: Stable accentuated activity at the L5-S1 intervertebral level, likely inflammatory. No appreciable osseous metastatic disease.  Incidental CT findings: Levoconvex lumbar scoliosis.  IMPRESSION: 1. The enlarging 1.5 by 1.2 cm right upper lobe pulmonary nodule has a maximum SUV of 1.8. Back on 06/22/2016 this lesion measured 0.8 by 0.9 cm and had a maximum SUV of 1.0. The enlargement and progression in metabolic activity favors low-grade adenocarcinoma. 2. Similarly, a right lower lobe nodule posterior to the major fissure has mildly enlarged and minimally increased in maximum SUV compared to the prior PET-CT of 06/12/2016, and could likewise represent low-grade adenocarcinoma. 3. A 8 mm right upper lobe pulmonary nodule on image 35/8 has increased in size compared to the prior PET-CT but does not currently have accentuated metabolic activity. Surveillance of  this lesion and some of the other nodules such as the ground-glass density 1.3 by 0.8 cm non hypermetabolic lesion in the left lower lobe is suggested. 4. Stable activity in stable minimal enlargement of a lower paratracheal node anterior to the carina. 5. No findings of malignancy involving the abdomen/pelvis or skeleton. 6. Other imaging findings of potential clinical significance: Aortic Atherosclerosis (ICD10-I70.0). Coronary atherosclerosis. Nonobstructive left nephrolithiasis. Small uterine fibroids. Chronic inflammatory activity at L5-S1. Levoconvex lumbar scoliosis.   Electronically Signed   By: Van Clines M.D.   On: 01/09/2018 16:30   Ct Chest W Contrast  Result Date: 11/14/2016 CLINICAL DATA:  History of left lung cancer, follow-up right lower lobe nodule. History of right breast cancer and anal cancer. EXAM: CT CHEST WITH CONTRAST TECHNIQUE: Multidetector CT imaging of the chest was performed during intravenous contrast administration. CONTRAST:  8m ISOVUE-300 IOPAMIDOL (ISOVUE-300) INJECTION 61% COMPARISON:  PET-CT dated 06/22/2016 FINDINGS: Cardiovascular: Heart is normal in size.  No pericardial effusion. Coronary atherosclerosis of the LAD. Atherosclerotic calcifications of the aortic arch. No evidence of thoracic aortic aneurysm. Mediastinum/Nodes: Small mediastinal lymph nodes,  including a 9 mm short axis low right paratracheal node with preservation of the normal fatty hilum. No suspicious axillary lymphadenopathy. Visualized thyroid is unremarkable. Lungs/Pleura: Mild biapical pleural-parenchymal scarring. Postsurgical changes related to prior left upper lobe wedge resection. 14 x 16 mm irregular nodular opacity in the anterior right lower lobe (series 4/image 78), non FDG avid. Additional scattered nodules in the right lung measuring up to 9 mm in the medial right upper lobe (series 4/ image 39), non FDG avid although possibly beneath the size threshold for PET  sensitivity. No focal consolidation. No pleural effusion or pneumothorax. Upper Abdomen: Visualized upper abdomen is unremarkable. Musculoskeletal: Visualized osseous structures are within normal limits. IMPRESSION: Status post left upper lobe wedge resection. Stable 14 x 16 mm irregular nodular opacity in the anterior right lower lobe, non FDG avid on prior PET. Additional scattered right lung nodules measuring up to 9 mm in the medial right upper lobe, unchanged. Aortic Atherosclerosis (ICD10-I70.0). Electronically Signed   By: Julian Hy M.D.   On: 11/14/2016 15:06     Previous CT of chest   IMPRESSION: 1. A posterior left upper lobe solid 11 mm nodule is suspicious for metastatic disease versus less likely attack metachronous primary bronchogenic carcinoma. Consider tissue sampling versus further evaluation with PET. 2. Other solid and sub solid pulmonary nodules are primarily similar. 10 mm left lower lobe and 9 mm right upper lobe sub solid nodules have enlarged. Apparent increase in size of a solid right lower lobe nodule, favored to be due to thinner slice collimation today. 3. No thoracic adenopathy. 4. Age advanced coronary artery atherosclerosis. Recommend assessment of coronary risk factors and consideration of medical therapy.  Recent Lab Findings: Lab Results  Component Value Date   WBC 5.6 06/07/2018   HGB 16.3 (H) 06/07/2018   HCT 48.3 (H) 06/07/2018   PLT 214 06/07/2018   GLUCOSE 111 (H) 05/03/2018   CHOL 224 (H) 04/07/2016   TRIG 66 04/07/2016   HDL 81 04/07/2016   LDLCALC 130 (H) 04/07/2016   ALT 18 05/03/2018   AST 25 05/03/2018   NA 140 05/03/2018   K 4.1 05/03/2018   CL 97 (L) 05/03/2018   CREATININE 0.62 05/03/2018   BUN 9 05/03/2018   CO2 31 05/03/2018   TSH 3.173 12/25/2014   INR 1.04 06/07/2018      Assessment / Plan:      Patient with progressive bilateral pulmonary groundglass semisolid nodules, after resection of adenosquamous  carcinoma of the lung in February 2019, stage Ia with previous history of breast cancer and anal cancer.    Patient had PDL 1 testing done, she received a denial of coverage for foundation 1 testing.  The patient was discussed at the multidisciplinary thoracic oncology conference this morning with a dominant changing right upper lobe lung nodule, having a positive biopsy, the consensus was to proceed with stereotactic radiotherapy to the dominant changing lesion that was biopsied in the right upper lobe, and continue to follow the other lesions for changes.  We will make referral to Gina. Lisbeth Renshaw who is seen the patient previously for breast radiation.  We will check with the cancer center about the insurance denial for  foundation 1 testing.  Gina Isaac Cervantes      [address] [place] [address] Office [phone]   [place]  06/13/2018 2:36 PM

## 2018-06-14 ENCOUNTER — Telehealth: Payer: Self-pay | Admitting: *Deleted

## 2018-06-14 NOTE — Telephone Encounter (Signed)
Oncology Nurse Navigator Documentation  Oncology Nurse Navigator Flowsheets 06/14/2018  Navigator Location [facility]  Navigator Encounter Type Telephone;Other  Telephone Outgoing Call/I reached out to [place] to get Ms. Cobbins set up with Dr. Lisbeth Renshaw and she is scheduled to see him 06/25/2018.   I did get an update from Dr. Benay Spice that patient is having trouble with [employer] covering foundation one testing.  I called foundation one and rep is working on Teacher, English as a foreign language.  I also obtained an application to help pay if insurance does not pay.  I called Ms. Qian to update but was unable to reach or leave a vm message.    Treatment Phase Pre-Tx/Tx Discussion  Barriers/Navigation Needs Coordination of Care  Interventions Coordination of Care  Coordination of Care Appts;Other  Acuity Level 2  Time Spent with Patient 60

## 2018-06-24 NOTE — Progress Notes (Signed)
Thoracic Location of Tumor / Histology: Malignant neoplasm of right upper lobe  Patient presented for restaging CT  CT chest 04/22/2018- no new lung nodules, dominant right lung nodules slightly increased in size  PET 01/09/2018- right upper lobe nodule and right lower lobe nodule have enlarged since 2018 and have low level metabolic activity increased size of an 8 mm right upper lobe nodule without increased metabolic activity, stable activity and a mildly enlarged lower paratracheal node, no evidence of metastatic disease in the abdomen or pelvis  CT chest 12/13/2017- enlargement of right upper lobe nodule, other lung nodules and chest lymph node stable.  CT chest 11/14/2016-status post left upper lobe wedge resection, stable right lung nodules  Biopsies of RUL 06/07/2018    Tobacco/Marijuana/Snuff/ETOH use: Former smoker  Past/Anticipated interventions by cardiothoracic surgery, if any:  Dr. Gerhardt 06/13/2018 -Lung, wedge biopsy/resection, Left upper lobe- well differentiated adenosquamous carcinoma, margins negative - 0/4 nodes positive -12/27 Attempted navigation bronchoscopy did not reveal a definitive tissue diagnosis on the lesions in the right lung, lymphoid tissue was sampled at the #7 node and confirm but there was no malignancy noted there. -Patient with progressive bilateral pulmonary groundglass semisolid nodules, after resection of adenosquamous carcinoma of the lung in February 2019, stage Ia with previous history of breast cancer and anal cancer.   -Patient had PDL 1 testing done, she received a denial of coverage for foundation 1 testing. -The patient was discussed at the multidisciplinary thoracic oncology conference this morning with a dominant changing right upper lobe lung nodule, having a positive biopsy, the consensus was to proceed with stereotactic radiotherapy to the dominant changing lesion that was biopsied in the right upper lobe, and continue to follow the other  lesions for changes.  We will make referral to Dr. Moody who is seen the patient previously for breast radiation.  Past/Anticipated interventions by medical oncology, if any:  Dr. Sherrill 05/09/2018 --Bronchoscopy 06/03/2018: An FNA biopsy was performed of a level 7 lymph node.  The target lesion in the right upper lobe could not be accessed.  The second target in the right lower lobe was easily reached and multiple biopsies were obtained. -The cytology from the level 7 node returned negative.  The right lower lobe transbronchial biopsy returned negative.  Right lower lobe needle aspiration and brushings returned negative. - I reviewed the most recent CT images with [name] and her husband.  She understands the slowly enlarging lung nodules likely represent indolent primary lung cancers.  We discussed options including observation, a repeat biopsy attempt, surgical resection, and empiric radiation.  I favor observation and a repeat CT at a 3-month interval.  It would be ideal to have a tissue diagnosis for molecular testing. -She is scheduled to see Dr. Gerhardt next week.  She will return for an office visit here in 3 months.  Signs/Symptoms  Weight changes, if any: Intention weight loss.  Respiratory complaints, if any: No  Hemoptysis, if any: No cough, no bleeding  Pain issues, if any: No  BP 123/77 (BP Location: Left Arm, Patient Position: Sitting)   Pulse 78   Temp 99.1 F (37.3 C) (Oral)   Resp 20   Wt 147 lb (66.7 kg)   LMP 04/07/2008   SpO2 96%   BMI 24.09 kg/m    Wt Readings from Last 3 Encounters:  06/25/18 147 lb (66.7 kg)  06/13/18 143 lb (64.9 kg)  06/07/18 144 lb (65.3 kg)    SAFETY ISSUES:  Prior radiation?   R Breast radiation, Anal treatment 09/02/12-10/10/2012.  Pacemaker/ICD? No  Possible current pregnancy? No  Is the patient on methotrexate? No  Current Complaints / other details:   -Anal Cancer 2014 -Breast Cancer 2009- neoadjuvant chemo, Rt lumpectomy  with nodes (5 - nodes), radiation. -Wedge resection of a hypermetabolic left upper lobe nodule on 07/05/2016-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 No EGFR, ALK, BRAF, RET, ERBB2, or ROS1 alteration 

## 2018-06-25 ENCOUNTER — Encounter: Payer: Self-pay | Admitting: Radiation Oncology

## 2018-06-25 ENCOUNTER — Ambulatory Visit
Admission: RE | Admit: 2018-06-25 | Discharge: 2018-06-25 | Disposition: A | Discharge status: Home / Self Care | Payer: BLUE CROSS/BLUE SHIELD | Source: Ambulatory Visit | Attending: Radiation Oncology | Admitting: Radiation Oncology

## 2018-06-25 ENCOUNTER — Other Ambulatory Visit: Payer: Self-pay

## 2018-06-25 ENCOUNTER — Telehealth: Payer: Self-pay | Admitting: *Deleted

## 2018-06-25 ENCOUNTER — Other Ambulatory Visit: Payer: Self-pay | Admitting: Radiation Oncology

## 2018-06-25 VITALS — BP 123/77 | HR 78 | Temp 99.1°F | Resp 20 | Wt 147.0 lb

## 2018-06-25 DIAGNOSIS — Z923 Personal history of irradiation: Secondary | ICD-10-CM | POA: Insufficient documentation

## 2018-06-25 DIAGNOSIS — Z853 Personal history of malignant neoplasm of breast: Secondary | ICD-10-CM | POA: Diagnosis not present

## 2018-06-25 DIAGNOSIS — M1990 Unspecified osteoarthritis, unspecified site: Secondary | ICD-10-CM | POA: Insufficient documentation

## 2018-06-25 DIAGNOSIS — C3411 Malignant neoplasm of upper lobe, right bronchus or lung: Secondary | ICD-10-CM | POA: Diagnosis not present

## 2018-06-25 DIAGNOSIS — R070 Pain in throat: Secondary | ICD-10-CM

## 2018-06-25 DIAGNOSIS — M25559 Pain in unspecified hip: Secondary | ICD-10-CM | POA: Diagnosis not present

## 2018-06-25 DIAGNOSIS — Z79899 Other long term (current) drug therapy: Secondary | ICD-10-CM | POA: Diagnosis not present

## 2018-06-25 DIAGNOSIS — Z87891 Personal history of nicotine dependence: Secondary | ICD-10-CM | POA: Diagnosis not present

## 2018-06-25 DIAGNOSIS — R918 Other nonspecific abnormal finding of lung field: Secondary | ICD-10-CM | POA: Diagnosis not present

## 2018-06-25 LAB — GROUP A STREP BY PCR: Group A Strep by PCR: NOT DETECTED

## 2018-06-25 MED ORDER — AMOXICILLIN-POT CLAVULANATE 875-125 MG PO TABS: 1.0000 | ORAL_TABLET | Freq: Two times a day (BID) | ORAL | 0 refills | Status: AC

## 2018-06-25 NOTE — Progress Notes (Addendum)
Radiation Oncology         [phone] ________________________________  Name: Gina Cervantes        MRN: [id number]  Date of Service: 06/25/2018 DOB: [birth year]  KG:YJEHUDJ, No Pcp Per  [name], MD     REFERRING PHYSICIAN: [name], MD   DIAGNOSIS: The encounter diagnosis was Malignant neoplasm of right upper lobe of lung ([place]).   HISTORY OF PRESENT ILLNESS: Gina Cervantes is a 58 y.o. female seen at the request of Dr. Servando Snare for increasing pulmonary nodules in the setting of a prior Stage I lung cancer. The patient also has a history of early stage breast cancer treated with lumpectomy, adjuvant radiotherapy, and antiestrogen therapy, as well as a history of anal cancer treated with chemoRT. She has been NED from those diseases, but was found in 2018 to have a nodule in the LUL of the lung. She underwent wedge resection of this on 07/05/16 revealing a Stage IA2, pT1bN0 NSCLC, adenosquamous histology. She has been followed with surveillance CT imaging and has had several visible nodules that have been followed. On 12/13/17, a CT revealed the RUL bilobed nodule had increased in size and was 9 x 11 mm. A PET on 01/09/18 showed an SUV of 1.8 and measured 15 x 12 mm. A second right upper lobe nodule measuring 9 x 7 mm did not have hypermetabolism. A RLL nodule measured 1.2 cm along the fissure and had an SUV of 1.8. the LLL nodule measuring 1.3 x .8 cm was also seen by not hypermetabolic. She returned for repeat imaging with CT Super D scan on 04/22/18 that showed the dominant lesion in the RUL to measure 18 x 14 x 23 mm, and another prominent RUL nodule measuring 21 x 19 x 17 mm. Other lesions were stable appearing. Bronchoscopy was performed on 05/03/18 and the specimens were negative for disease. A CT biopsy on 06/07/18 revealed adenocarcinoma consistent with lung primary. She comes today to discuss options of stereotactic body radiotherapy to the RUL lesions. Her case was  discussed in conference, and plans are to follow her other nodules.  PREVIOUS RADIATION THERAPY: Yes    09/02/2012 through 10/10/2012: The patient was treated to the anal region and high risk region. This was carried out using a IMRT technique with daily image guidance.  The patient received a total of 50.4 gray at 1.8 gray per fraction. The patient was initially treated using daily image guidance on a standard linear accelerator, and then she was treated on tomotherapy after the first week of treatment.   2010:  Adjuvant whole breast radiation to the right breast with Dr. Beola Cord  PAST MEDICAL HISTORY:  Past Medical History:  Diagnosis Date  . Anal cancer ([place]) 08/09/2012   Squamous cell  . Anxiety    PANIC ATTACKS  . Arthritis   . Breast cancer ([place]) 2009   ER+PR+HER-2+  . Ductal carcinoma ([place]) 02/2008    invasive   . Heart palpitations   . History of radiation therapy 09/02/12-10/10/12   anal 50.4Gy  . History of shingles 10/2014   . HPV (human papilloma virus) anogenital infection    vulvar/ freezing hx  . Lung nodule   . Malignant neoplasm of upper lobe of left lung ([place]) 06/2016  . Personal history of chemotherapy   . Personal history of radiation therapy 2010  . Pneumonia    NO RECENT PROBLEMS  . PONV (postoperative nausea and vomiting)    Hypotension  .  Radiation 10/21/08-12/08/08   Right breast 6240 cGy  . Shingles 2017  . Status post chemotherapy 02/2008   Taxol/Herceptin       PAST SURGICAL HISTORY: Past Surgical History:  Procedure Laterality Date  . BREAST BIOPSY Right 02/04/2008   malignant  . BREAST LUMPECTOMY Right 2010   malignant  . BREAST LUMPECTOMY WITH AXILLARY LYMPH NODE BIOPSY Right 5/10   Dr. Marlou Starks  . CESAREAN SECTION    . COLONOSCOPY W/ POLYPECTOMY    . EVALUATION UNDER ANESTHESIA WITH ANAL FISTULECTOMY N/A 08/09/2012   Procedure: EXAM UNDER ANESTHESIA AND BIOPSY OF ANAL MASS;  Surgeon: Odis Hollingshead, MD;  Location: [facility];  Service: General;   Laterality: N/A;  . KNEE ARTHROSCOPY Left    left knee  . PORTACATH PLACEMENT  03/2008   dr. Marlou Starks   . REMOVAL PORTACATH  2011  . VIDEO ASSISTED THORACOSCOPY (VATS)/WEDGE RESECTION Left 07/05/2016   Procedure: VIDEO ASSISTED THORACOSCOPY (VATS)/WEDGE RESECTION left upper lobe,  lymph node dissection and placement of OnQ catheter;  Surgeon: [name], MD;  Location: [facility];  Service: Thoracic;  Laterality: Left;  Marland Kitchen VIDEO BRONCHOSCOPY N/A 07/05/2016   Procedure: VIDEO BRONCHOSCOPY;  Surgeon: [name], MD;  Location: [place];  Service: Thoracic;  Laterality: N/A;  . VIDEO BRONCHOSCOPY N/A 05/03/2018   Procedure: VIDEO BRONCHOSCOPY;  Surgeon: [name], MD;  [facility];  Service: Thoracic;  Laterality: N/A;  . VIDEO BRONCHOSCOPY WITH ENDOBRONCHIAL NAVIGATION N/A 05/03/2018   Procedure: VIDEO BRONCHOSCOPY WITH ENDOBRONCHIAL NAVIGATION WITH BIOPSY;  Surgeon: [name], MD;  Location: [place];  Service: Thoracic;  Laterality: N/A;  . VIDEO BRONCHOSCOPY WITH ENDOBRONCHIAL ULTRASOUND N/A 05/03/2018   Procedure: VIDEO BRONCHOSCOPY WITH ENDOBRONCHIAL ULTRASOUND;  Surgeon: [name], MD;  Location: [place];  Service: Thoracic;  Laterality: N/A;     FAMILY HISTORY:  Family History  Problem Relation Age of Onset  . Lung cancer Father   . Stroke Mother   . Breast cancer Neg Hx      SOCIAL HISTORY:  reports that she quit smoking about 5 years ago. Her smoking use included cigarettes. She quit after 8.00 years of use. She has never used smokeless tobacco. She reports current alcohol use of about 5.0 standard drinks of alcohol per week. She reports that she does not use drugs. The patient is married and lives in [place]. She is a [employer]in [employer].   ALLERGIES: No known allergies   MEDICATIONS:  Current Outpatient Medications  Medication Sig Dispense Refill  . CALCIUM PO Take 1 tablet by mouth daily.    Marland Kitchen loratadine (CLARITIN) 10 MG tablet Take 10 mg by mouth as  needed for allergies.    Marland Kitchen LORazepam (ATIVAN) 1 MG tablet Take 1 tablet (1 mg total) by mouth every 8 (eight) hours. (Patient taking differently: Take 1 mg by mouth every 8 (eight) hours as needed for anxiety. ) 30 tablet 0  . Multiple Vitamins-Minerals (MULTIVITAMIN WITH MINERALS) tablet Take 1 tablet by mouth daily.    . VENTOLIN HFA 108 (90 Base) MCG/ACT inhaler TAKE 2 PUFFS BY MOUTH EVERY 6 HOURS AS NEEDED FOR WHEEZE OR SHORTNESS OF BREATH (Patient taking differently: Inhale 2 puffs into the lungs every 6 (six) hours as needed for wheezing or shortness of breath. ) 18 Inhaler 2   No current facility-administered medications for this encounter.      REVIEW OF SYSTEMS: On review of systems, the patient reports that she is doing well overall.  She reports she's had a sore throat that has progressed in the last two days. She describes a firey sensation when she swallows. She's had multiple episodes of strep throat in the past. She is concerned she may have this and is going out of town this weekend on vacation out of the country. She denies any chest pain, shortness of breath, cough, fevers, chills, night sweats, unintended weight changes. She denies any bowel or bladder disturbances, and denies abdominal pain, nausea or vomiting. She does describe persistent hip pain since completing radiation several years ago. She also describes atrophic vaginitis and uses coconut oil for moisture and for a sexual lubricant. She denies any new musculoskeletal or joint aches or pains. A complete review of systems is obtained and is otherwise negative.     PHYSICAL EXAM:  Wt Readings from Last 3 Encounters:  06/25/18 147 lb (66.7 kg)  06/13/18 143 lb (64.9 kg)  06/07/18 144 lb (65.3 kg)   Temp Readings from Last 3 Encounters:  06/25/18 99.1 F (37.3 C) (Oral)  06/07/18 98 F (36.7 C) (Oral)  05/09/18 98.4 F (36.9 C) (Oral)   BP Readings from Last 3 Encounters:  06/25/18 123/77  06/13/18 102/76    06/07/18 (!) 98/52   Pulse Readings from Last 3 Encounters:  06/25/18 78  06/13/18 90  06/07/18 79   Pain Assessment Pain Score: 0-No pain/10  In general this is a well appearing caucasian in no acute distress. she is alert and oriented x4 and appropriate throughout the examination. HEENT reveals that the patient is normocephalic, atraumatic. EOMs are intact. Evaluation of bilateral ears is performed and reveals serous effusion posterior to the right TM, the left is clear. She has injected posterior pharynx, no rhinorrhea is noted. Skin is intact without any evidence of gross lesions. Cardiovascular exam reveals a regular rate and rhythm, no clicks rubs or murmurs are auscultated. Chest is clear to auscultation bilaterally. Lymphatic assessment is performed and does not reveal any adenopathy in the cervical, supraclavicular, or axillary chains.    ECOG = 0  0 - Asymptomatic (Fully active, able to carry on all predisease activities without restriction)  1 - Symptomatic but completely ambulatory (Restricted in physically strenuous activity but ambulatory and able to carry out work of a light or sedentary nature. For example, light housework, office work)  2 - Symptomatic, <50% in bed during the day (Ambulatory and capable of all self care but unable to carry out any work activities. Up and about more than 50% of waking hours)  3 - Symptomatic, >50% in bed, but not bedbound (Capable of only limited self-care, confined to bed or chair 50% or more of waking hours)  4 - Bedbound (Completely disabled. Cannot carry on any self-care. Totally confined to bed or chair)  5 - Death   Eustace Pen MM, Creech RH, Tormey DC, et al. [phone]). "Toxicity and response criteria of the [facility]". [place] Oncol. 5 (6): 649-55    LABORATORY DATA:  Lab Results  Component Value Date   WBC 5.6 06/07/2018   HGB 16.3 (H) 06/07/2018   HCT 48.3 (H) 06/07/2018   MCV 102.8 (H) 06/07/2018    PLT 214 06/07/2018   Lab Results  Component Value Date   NA 140 05/03/2018   K 4.1 05/03/2018   CL 97 (L) 05/03/2018   CO2 31 05/03/2018   Lab Results  Component Value Date   ALT 18 05/03/2018   AST 25 05/03/2018  ALKPHOS 59 05/03/2018   BILITOT 1.2 05/03/2018      RADIOGRAPHY: Ct Biopsy  Result Date: 06/07/2018 INDICATION: Right upper lobe lung nodule EXAM: CT BIOPSY MEDICATIONS: None. ANESTHESIA/SEDATION: Fentanyl 75 mcg IV; Versed 1.5 mg IV Moderate Sedation Time:  12 minutes The patient was continuously monitored during the procedure by the interventional radiology nurse under my direct supervision. FLUOROSCOPY TIME:  Fluoroscopy Time:  minutes  seconds ( mGy). COMPLICATIONS: SIR Level A - No therapy, no consequence. Trace pneumothorax after biopsy. PROCEDURE: Informed written consent was obtained from the patient after a thorough discussion of the procedural risks, benefits and alternatives. All questions were addressed. Maximal Sterile Barrier Technique was utilized including caps, mask, sterile gowns, sterile gloves, sterile drape, hand hygiene and skin antiseptic. A timeout was performed prior to the initiation of the procedure. Under CT guidance, a(n) 17 gauge guide needle was advanced into the right upper lobe lung nodule. Subsequently, 3 18 gauge core biopsies were obtained. A collagen plug was deployed in the needle tract. The guide needle was removed. Post biopsy images demonstrate trace pneumothorax. Patient tolerated the procedure well without complication. Vital sign monitoring by nursing staff during the procedure will continue as patient is in the special procedures unit for post procedure observation. FINDINGS: The images document guide needle placement within the right upper lobe lung nodule. Post biopsy images demonstrate trace pneumothorax. IMPRESSION: Successful CT-guided right upper lobe lung nodule core biopsy. Electronically Signed   By: Marybelle Killings M.D.   On:  06/07/2018 12:36   Dg Chest Port 1 View  Result Date: 06/07/2018 CLINICAL DATA:  Status post right upper lobe lung biopsy EXAM: PORTABLE CHEST 1 VIEW COMPARISON:  05/03/2018 FINDINGS: There is no evidence of pneumothorax status post right lung biopsy. Postoperative changes in the left upper lobe are stable. Upper normal heart size. Lungs otherwise clear. IMPRESSION: No evidence of pneumothorax after lung biopsy. Electronically Signed   By: Marybelle Killings M.D.   On: 06/07/2018 13:33       IMPRESSION/PLAN: 1. History of Stage IA, pT1bN0M0 NSCLC, adenosquamous carcinoma of the LUL with new Stage IA2, cT1bN0M0 adenocarcinoma of the RUL and adjacent RUL progressive nodule. Dr. Lisbeth Renshaw discusses the pathology findings and reviews the nature of early stage lung cancers.  We discussed the risks, benefits, short, and long term effects of radiotherapy, and the patient is interested in proceeding. Dr. Lisbeth Renshaw discusses the delivery and logistics of radiotherapy and anticipates a course of 3-5 fractions of SBRT to the biopsied lesion as well as to the second suspicious nodule in the RUL. Written consent is obtained and placed in the chart, a copy was provided to the patient. She will return after her trip for simulation. 2. Sore throat. The patient was swabbed by nursing for rapid strep. If her test is negative, I did let her know I would still consider sending in a prescription for Augmentin if her symptoms progressed while she's out of town. She was counseled on the side effect profile of this.  3. Atrophic Vaginitis and Hip pain. It is likely her symptoms persistent from prior radiation. We discussed the options of meeting with pelvic floor PT when she returns from her trip. 4. Remote history of estrogen positive breast cancer and history of anal cancer. She continues with Dr. Benay Spice in medical oncology for her care and sees GYN annually as well.   In a visit lasting 45 minutes, greater than 50% of the time was  spent face to face discussing  her case, and coordinating the patient's care.   The above documentation reflects my direct findings during this shared patient visit. Please see the separate note by Dr. Lisbeth Renshaw on this date for the remainder of the patient's plan of care.    Carola Rhine, PAC

## 2018-06-25 NOTE — Telephone Encounter (Signed)
Spoke with the patient to let her know that her rapid strep came back negative.  I let her know that if her symptoms worsen over the next couple of days PA Dara Lords would call in augmentin.  Will continue to follow as necessary.  Gina Cervantes. Leonie Green, BSN

## 2018-06-28 ENCOUNTER — Inpatient Hospital Stay: Payer: BLUE CROSS/BLUE SHIELD | Attending: Oncology

## 2018-06-28 ENCOUNTER — Inpatient Hospital Stay: Payer: BLUE CROSS/BLUE SHIELD

## 2018-06-28 DIAGNOSIS — C3411 Malignant neoplasm of upper lobe, right bronchus or lung: Secondary | ICD-10-CM | POA: Diagnosis not present

## 2018-06-28 DIAGNOSIS — C3412 Malignant neoplasm of upper lobe, left bronchus or lung: Secondary | ICD-10-CM

## 2018-06-28 LAB — CBC WITH DIFFERENTIAL (CANCER CENTER ONLY)
Abs Immature Granulocytes: 0.02 10*3/uL (ref 0.00–0.07)
Basophils Absolute: 0 10*3/uL (ref 0.0–0.1)
Basophils Relative: 1 %
Eosinophils Absolute: 0.2 10*3/uL (ref 0.0–0.5)
Eosinophils Relative: 4 %
HCT: 45.8 % (ref 36.0–46.0)
Hemoglobin: 15.5 g/dL — ABNORMAL HIGH (ref 12.0–15.0)
Immature Granulocytes: 0 %
Lymphocytes Relative: 19 %
Lymphs Abs: 1 10*3/uL (ref 0.7–4.0)
MCH: 35.1 pg — ABNORMAL HIGH (ref 26.0–34.0)
MCHC: 33.8 g/dL (ref 30.0–36.0)
MCV: 103.6 fL — ABNORMAL HIGH (ref 80.0–100.0)
Monocytes Absolute: 0.7 10*3/uL (ref 0.1–1.0)
Monocytes Relative: 13 %
Neutro Abs: 3.3 10*3/uL (ref 1.7–7.7)
Neutrophils Relative %: 63 %
Platelet Count: 213 10*3/uL (ref 150–400)
RBC: 4.42 MIL/uL (ref 3.87–5.11)
RDW: 12.5 % (ref 11.5–15.5)
WBC Count: 5.2 10*3/uL (ref 4.0–10.5)
nRBC: 0 % (ref 0.0–0.2)

## 2018-07-01 ENCOUNTER — Telehealth: Payer: Self-pay | Admitting: *Deleted

## 2018-07-01 NOTE — Telephone Encounter (Signed)
TCT patient regarding lab work from last week. Reviewed CBC with her. She voiced understanding. No questions or concerns  Pt would like results released to her MyChart

## 2018-07-01 NOTE — Telephone Encounter (Signed)
-----   Message from Gina Pier, MD sent at 06/29/2018  6:21 PM EST ----- Please call patient, hb is lower, f/u as scheduled

## 2018-07-15 ENCOUNTER — Ambulatory Visit
Admission: RE | Admit: 2018-07-15 | Discharge: 2018-07-15 | Disposition: A | Discharge status: Home / Self Care | Payer: BLUE CROSS/BLUE SHIELD | Source: Ambulatory Visit | Attending: Radiation Oncology | Admitting: Radiation Oncology

## 2018-07-15 DIAGNOSIS — C3411 Malignant neoplasm of upper lobe, right bronchus or lung: Secondary | ICD-10-CM | POA: Diagnosis not present

## 2018-07-15 DIAGNOSIS — R070 Pain in throat: Secondary | ICD-10-CM | POA: Insufficient documentation

## 2018-07-16 DIAGNOSIS — C3411 Malignant neoplasm of upper lobe, right bronchus or lung: Secondary | ICD-10-CM | POA: Insufficient documentation

## 2018-07-16 NOTE — Progress Notes (Signed)
[facility] Simulation and Treatment Planning Note   Name:  KARUNA BALDUCCI MRN: [id number]   Date: 07/15/2018  DOB: Mar 18, 1961  Status:outpatient    DIAGNOSIS:    ICD-10-CM   1. Malignant neoplasm of bronchus of right upper lobe ([facility]) C34.11      CONSENT VERIFIED:yes   SET UP: Patient is setup supine   IMMOBILIZATION: The patient was immobilized using a customized Vac Loc bag/ blue bag and customized accuform device   NARRATIVE:The patient was brought to the [facility].  Identity was confirmed.  All relevant records and images related to the planned course of therapy were reviewed.  Then, the patient was positioned in a stable reproducible clinical set-up for radiation therapy. Abdominal compression was applied by me.  4D CT images were obtained and reproducible breathing pattern was confirmed. Free breathing CT images were obtained.  Skin markings were placed.  The CT images were loaded into the planning software where the target and avoidance structures were contoured.  The radiation prescription was entered and confirmed.    TREATMENT PLANNING NOTE:  Treatment planning then occurred. I have requested : IMRT planning.This treatment technique is medically necessary due to the high-dose of radiation delivered to the target region which is in close proximity to adjacent critical normal structures.  3 dimensional simulation is performed and dose volume histogram of the gross tumor volume, planning tumor volume and criticial normal structures including the spinal cord and lungs were analyzed and requested.  Special treatment procedure was performed due to high dose per fraction.  The patient will be monitored for increased risk of toxicity.  Daily imaging using cone beam CT will be used for target localization.  I anticipate that the patient will receive 54 Gy in 3 fractions to target volume. Further adjustments will be made based  on the planning process is necessary.  ------------------------------------------------  Jodelle Gross, MD, PhD

## 2018-07-18 DIAGNOSIS — R070 Pain in throat: Secondary | ICD-10-CM | POA: Diagnosis not present

## 2018-07-18 DIAGNOSIS — C3411 Malignant neoplasm of upper lobe, right bronchus or lung: Secondary | ICD-10-CM | POA: Diagnosis not present

## 2018-07-22 ENCOUNTER — Ambulatory Visit: Payer: BLUE CROSS/BLUE SHIELD | Admitting: Radiation Oncology

## 2018-07-23 ENCOUNTER — Ambulatory Visit
Admission: RE | Admit: 2018-07-23 | Discharge: 2018-07-23 | Disposition: A | Discharge status: Home / Self Care | Payer: BLUE CROSS/BLUE SHIELD | Source: Ambulatory Visit | Attending: Radiation Oncology | Admitting: Radiation Oncology

## 2018-07-23 ENCOUNTER — Other Ambulatory Visit: Payer: Self-pay

## 2018-07-23 DIAGNOSIS — C3411 Malignant neoplasm of upper lobe, right bronchus or lung: Secondary | ICD-10-CM | POA: Diagnosis not present

## 2018-07-23 DIAGNOSIS — R070 Pain in throat: Secondary | ICD-10-CM | POA: Diagnosis not present

## 2018-07-25 ENCOUNTER — Ambulatory Visit
Admission: RE | Admit: 2018-07-25 | Discharge: 2018-07-25 | Disposition: A | Discharge status: Home / Self Care | Payer: BLUE CROSS/BLUE SHIELD | Source: Ambulatory Visit | Attending: Radiation Oncology | Admitting: Radiation Oncology

## 2018-07-25 DIAGNOSIS — R070 Pain in throat: Secondary | ICD-10-CM | POA: Diagnosis not present

## 2018-07-25 DIAGNOSIS — C3411 Malignant neoplasm of upper lobe, right bronchus or lung: Secondary | ICD-10-CM | POA: Diagnosis not present

## 2018-07-29 ENCOUNTER — Ambulatory Visit: Payer: BLUE CROSS/BLUE SHIELD | Admitting: Obstetrics & Gynecology

## 2018-07-30 ENCOUNTER — Encounter: Payer: Self-pay | Admitting: Radiation Oncology

## 2018-07-30 ENCOUNTER — Ambulatory Visit
Admission: RE | Admit: 2018-07-30 | Discharge: 2018-07-30 | Disposition: A | Discharge status: Home / Self Care | Payer: BLUE CROSS/BLUE SHIELD | Source: Ambulatory Visit | Attending: Radiation Oncology | Admitting: Radiation Oncology

## 2018-07-30 ENCOUNTER — Other Ambulatory Visit: Payer: Self-pay

## 2018-07-30 DIAGNOSIS — R070 Pain in throat: Secondary | ICD-10-CM | POA: Diagnosis not present

## 2018-07-30 DIAGNOSIS — C3411 Malignant neoplasm of upper lobe, right bronchus or lung: Secondary | ICD-10-CM | POA: Diagnosis not present

## 2018-08-05 ENCOUNTER — Telehealth: Payer: Self-pay | Admitting: *Deleted

## 2018-08-05 NOTE — Telephone Encounter (Signed)
Called to request "virtual visit" or telephone visit with Dr. Benay Spice on 08/08/18 instead of coming in. OK per MD.

## 2018-08-07 ENCOUNTER — Telehealth: Payer: Self-pay | Admitting: Radiation Oncology

## 2018-08-07 DIAGNOSIS — C3411 Malignant neoplasm of upper lobe, right bronchus or lung: Secondary | ICD-10-CM

## 2018-08-07 NOTE — Telephone Encounter (Signed)
See additional notes from today

## 2018-08-07 NOTE — Telephone Encounter (Addendum)
I called and spoke with the patient and reminded her that Dr. Lisbeth Renshaw treated the dominant right upper lobe tumor, which was biopsied. She does have additional areas we might need to treat at some point, so we will proceed with a repeat scan in 6 weeks. She is in agreement with this plan and I will call her with the results.

## 2018-08-08 ENCOUNTER — Telehealth: Payer: Self-pay | Admitting: Oncology

## 2018-08-08 ENCOUNTER — Inpatient Hospital Stay: Payer: BLUE CROSS/BLUE SHIELD | Attending: Oncology | Admitting: Oncology

## 2018-08-08 DIAGNOSIS — C3411 Malignant neoplasm of upper lobe, right bronchus or lung: Secondary | ICD-10-CM

## 2018-08-08 NOTE — Progress Notes (Signed)
[place] OFFICE PROGRESS NOTE   Diagnosis: Non-small cell lung cancer, anal cancer, breast cancer  INTERVAL HISTORY:  This was a telephone visit with Ms. Whisman.  I called her from my office.  She was in her home.  She underwent a CT-guided biopsy of a right upper lobe nodule on 06/07/2018.  The pathology confirmed adenocarcinoma.  She was referred to Dr. Lisbeth Renshaw and completed SBRT to the dominant right upper lobe nodule from 07/23/2018 through 07/30/2018.  She reports odynophagia last week after drinking coffee.  This has improved.  She has mild malaise.  No other complaint.  No change in either breast.  No difficulty with bowel function.  Lab Results:  Lab Results  Component Value Date   WBC 5.2 06/28/2018   HGB 15.5 (H) 06/28/2018   HCT 45.8 06/28/2018   MCV 103.6 (H) 06/28/2018   PLT 213 06/28/2018   NEUTROABS 3.3 06/28/2018    CMP  Lab Results  Component Value Date   NA 140 05/03/2018   K 4.1 05/03/2018   CL 97 (L) 05/03/2018   CO2 31 05/03/2018   GLUCOSE 111 (H) 05/03/2018   BUN 9 05/03/2018   CREATININE 0.62 05/03/2018   CALCIUM 9.2 05/03/2018   PROT 7.2 05/03/2018   ALBUMIN 3.8 05/03/2018   AST 25 05/03/2018   ALT 18 05/03/2018   ALKPHOS 59 05/03/2018   BILITOT 1.2 05/03/2018   GFRNONAA >60 05/03/2018   GFRAA >60 05/03/2018     Medications: I have reviewed the patient's current medications.   Assessment/Plan: 1.Anal cancer-left anal margin/anal canal mass, status post a biopsy on 08/09/2012 confirming invasive moderately differentiated squamous cell carcinoma,p16 positive.  -Staging PET scan 08/20/2012-hypermetabolic anal mass, no hypermetabolic metastases  -Initiation of concurrent radiation with cycle 1 of mitomycin C./5-fluorouracil 09/02/2012.  -She completed cycle 2 5-fluorouracil/mitomycin C. beginning 10/01/2012.  -She completed radiation 10/10/2012.  2. Right breast cancer 2009, ER positive, PR positive, HER-2 positive. She is  status post neoadjuvant AC x4 cycles followed by Taxol/Herceptin. She underwent a right lumpectomy and sentinel lymph node biopsy 09/22/2008 with no evidence of residual invasive carcinoma and 5 negative sentinel lymph nodes. She then completed right breast radiation. She began tamoxifen 12/15/2008 and completed adjuvant Herceptin 05/25/2009. The tamoxifen was discontinued and she was switched to Arimidex in July 2013. Arimidex discontinued in mid July 2019. 3. History of enlargement of the right tonsil.  4. Nonspecific pulmonary nodules without hypermetabolic activity on the PET scan 08/20/2012. Chest CT 10/21/2012 with scattered pulmonary nodules including nodules that were not present in 2010. Annual surveillance was recommended. Follow-up chest CT 10/24/2013 with scattered groundglass densities again noted in both lungs mostly unchanged when compared to the previous study. A sub-solid right lower lobe nodule had increased central density. The size was unchanged.The nodule has been present since 2009.  CT chest 06/13/2016-a sub-solid 9 mm right upper lobe nodule has enlarged compared to 2015, a right lower lobe irregular nodular density appears more well defined, new vague 5 mm groundglass left upper lobe nodule, left lower lobe 10 mm groundglass nodule is more apparent, posterior left upper lobe circumscribed solid nodule measures 11 x 10 mm and is new  PET scan 06/22/2016-malignant range activity involving the 11 mm left upper lobe nodule, no other hypermetabolic nodules, suspicious sub-solid right lower lobe nodule  Wedge resection of a hypermetabolic left upper lobe nodule on 07/05/2016-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,pT1,pN0, stage IA, negative resection margins, PDL 1  CPS- 1%,  MSS, tumor mutation burden 6, KRASG12C. No EGFR, ALK, BRAF, RET, ERBB2, or ROS1 alteration  CT chest 11/14/2016-status post left upper lobe wedge resection, stable right lung nodules   Chest CT 05/02/2017-stable bilateral solid and groundglass nodules, stable mild mediastinal lymphadenopathy  CT chest 12/13/2017- enlargement of right upper lobe nodule, other lung nodules and chest lymph node stable  PET 01/09/2018- right upper lobe nodule and right lower lobe nodule have enlarged since 2018 and have low level metabolic activity increased size of an 8 mm right upper lobe nodule without increased metabolic activity, stable activity and a mildly enlarged lower paratracheal node, no evidence of metastatic disease in the abdomen or pelvis  CT chest 04/22/2018- no new lung nodules, dominant right lung nodules slightly increased in size  Bronchoscopy/EBUS 05/04/2019 with biopsy of level 7 node, biopsy of right lower lobe nodule, FNA of right lower lobe nodule-negative  CT biopsy of right upper lobe nodule 06/07/2018- adenocarcinoma with lepidic and acinar patterns  SBRT to the dominant right upper lobe nodule 07/23/2018-07/30/2018     Disposition: Ms. Menger has a history of anal cancer, breast cancer, and non-small cell lung cancer.  She recently completed SBRT to a an enlarging right upper lobe nodule.  The nodule was biopsied 06/07/2021 pathology confirming adenocarcinoma. She understands there are other lung nodules that may represent synchronous tumors.  She is being scheduled for a chest CT and follow-up with radiation oncology.  I will schedule her for an office visit after the next chest CT.  The tumor resected from the left upper lung in 2018 has a K-ras G12C mutation.  She may be a candidate for a new RAS inhibitor if she develops disease progression.  I contacted Ms. Simko by telephone at approximately 3:05 PM.  25 minutes were spent talking to the patient, reviewing images and pathology reports, and documentation.    Betsy Coder, MD  08/08/2018  3:04 PM

## 2018-08-08 NOTE — Telephone Encounter (Signed)
Scheduled appt per 4/2 los.  Called patient and left a detailed VM about scheduled appt.

## 2018-08-27 NOTE — Progress Notes (Signed)
  Radiation Oncology         [phone]) [phone] ________________________________  Name: Gina Cervantes MRN: [id number]  Date: 07/30/2018  DOB: August 27, 1960  End of Treatment Note  Diagnosis:   58 y.o. 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 The patient received 54 Gy in 3 fractions at 18 Gy per fraction.  Beams/energy:   SBRT/SRT-VMAT // 6X-FFF Photon  Narrative: The patient tolerated radiation treatment relatively well.   The patient did not have any signs of acute toxicity during treatment.  Plan: The patient has completed radiation treatment. The patient will return to radiation oncology clinic for routine followup in one month. I advised the patient to call or return sooner if they have any questions or concerns related to their recovery or treatment.   ------------------------------------------------  Jodelle Gross, MD, PhD  This document serves as a record of services personally performed by Kyung Rudd, MD. It was created on his behalf by Rae Lips, a trained medical scribe. The creation of this record is based on the scribe's personal observations and the provider's statements to them. This document has been checked and approved by the attending provider.

## 2018-09-04 ENCOUNTER — Encounter: Payer: Self-pay | Admitting: Oncology

## 2018-09-05 ENCOUNTER — Telehealth: Payer: Self-pay | Admitting: *Deleted

## 2018-09-05 NOTE — Telephone Encounter (Signed)
Called patient to inform of CT for 09-16-18 - arrival time - 4:10 pm @ [facility] [phone]. [address] Location), pt to be NPO- 4 hrs. prior to test, pt. can have clear liquids only, pt. to bring mask and come alone for appt., patient verified understanding this

## 2018-09-10 ENCOUNTER — Telehealth: Payer: Self-pay | Admitting: Oncology

## 2018-09-10 NOTE — Telephone Encounter (Signed)
Per 5/6 reschedule list moved 5/6 f/u to 5/13. Confirmed with patient.

## 2018-09-11 ENCOUNTER — Inpatient Hospital Stay: Payer: BLUE CROSS/BLUE SHIELD | Admitting: Oncology

## 2018-09-16 ENCOUNTER — Ambulatory Visit
Admission: RE | Admit: 2018-09-16 | Discharge: 2018-09-16 | Disposition: A | Discharge status: Home / Self Care | Payer: BLUE CROSS/BLUE SHIELD | Source: Ambulatory Visit | Attending: Radiation Oncology | Admitting: Radiation Oncology

## 2018-09-16 ENCOUNTER — Other Ambulatory Visit: Payer: Self-pay

## 2018-09-16 DIAGNOSIS — C3411 Malignant neoplasm of upper lobe, right bronchus or lung: Secondary | ICD-10-CM

## 2018-09-16 DIAGNOSIS — R911 Solitary pulmonary nodule: Secondary | ICD-10-CM | POA: Diagnosis not present

## 2018-09-16 MED ORDER — IOPAMIDOL (ISOVUE-300) INJECTION 61%
75.0000 mL | Freq: Once | INTRAVENOUS | Status: AC | PRN
  Administered 2018-09-16: 75 mL via INTRAVENOUS

## 2018-09-17 ENCOUNTER — Telehealth: Payer: Self-pay | Admitting: Radiation Oncology

## 2018-09-17 DIAGNOSIS — C3411 Malignant neoplasm of upper lobe, right bronchus or lung: Secondary | ICD-10-CM

## 2018-09-17 NOTE — Telephone Encounter (Signed)
I called the patient to let her know her results of her CT scan. Dr. Lisbeth Renshaw reviewed her films and recommends repeating a scan in 3 months to ensure her untreated nodules in the lung do not progress and become a concern. Currently they appear to be stable. If her next scan is similar to yesterday's scan, we will likely go back to 6 month follow ups for the next 5 years according to NCCN early stage lung cancer surveillance guidelines. She was transferred upstairs to medical oncology to see if she needs to still come tomorrow for an appointment.

## 2018-09-17 NOTE — Telephone Encounter (Signed)
-----   Message from Kyung Rudd, MD sent at 09/17/2018 12:12 PM EDT ----- Yes, that sounds good. ----- Message ----- From: Hayden Pedro, PA-C Sent: 09/17/2018  10:34 AM EDT To: Kyung Rudd, MD  Do you want to repeat scan in 3 months?  ----- Message ----- From: Interface, Rad Results In Sent: 09/17/2018  10:28 AM EDT To: Hayden Pedro, PA-C

## 2018-09-18 ENCOUNTER — Ambulatory Visit (HOSPITAL_COMMUNITY): Payer: BLUE CROSS/BLUE SHIELD

## 2018-09-18 ENCOUNTER — Inpatient Hospital Stay: Payer: BLUE CROSS/BLUE SHIELD | Admitting: Nurse Practitioner

## 2018-09-20 ENCOUNTER — Telehealth: Payer: Self-pay | Admitting: Oncology

## 2018-09-20 NOTE — Telephone Encounter (Signed)
Left message re August f/u. Central radiology will call re scan. Schedule mailed.

## 2018-10-26 ENCOUNTER — Other Ambulatory Visit: Payer: Self-pay | Admitting: Oncology

## 2018-11-22 ENCOUNTER — Telehealth: Payer: Self-pay | Admitting: Oncology

## 2018-11-22 NOTE — Telephone Encounter (Signed)
GBS PAL 8/14 appointment moved from 8/14 to 8/21. Confirmed with patient.

## 2018-11-25 ENCOUNTER — Other Ambulatory Visit: Payer: Self-pay | Admitting: Oncology

## 2018-11-25 DIAGNOSIS — Z1231 Encounter for screening mammogram for malignant neoplasm of breast: Secondary | ICD-10-CM

## 2018-11-28 ENCOUNTER — Other Ambulatory Visit: Payer: Self-pay

## 2018-12-02 ENCOUNTER — Other Ambulatory Visit (HOSPITAL_COMMUNITY)
Admission: RE | Admit: 2018-12-02 | Discharge: 2018-12-02 | Disposition: A | Discharge status: Home / Self Care | Payer: BC Managed Care – PPO | Source: Ambulatory Visit | Attending: Obstetrics & Gynecology | Admitting: Obstetrics & Gynecology

## 2018-12-02 ENCOUNTER — Encounter: Payer: Self-pay | Admitting: Obstetrics & Gynecology

## 2018-12-02 ENCOUNTER — Ambulatory Visit: Payer: BC Managed Care – PPO | Admitting: Obstetrics & Gynecology

## 2018-12-02 ENCOUNTER — Other Ambulatory Visit: Payer: Self-pay

## 2018-12-02 VITALS — BP 90/60 | HR 96 | Temp 97.9°F | Ht 64.5 in | Wt 152.0 lb

## 2018-12-02 DIAGNOSIS — B349 Viral infection, unspecified: Secondary | ICD-10-CM | POA: Diagnosis not present

## 2018-12-02 DIAGNOSIS — Z0000 Encounter for general adult medical examination without abnormal findings: Secondary | ICD-10-CM | POA: Diagnosis not present

## 2018-12-02 DIAGNOSIS — Z124 Encounter for screening for malignant neoplasm of cervix: Secondary | ICD-10-CM | POA: Diagnosis not present

## 2018-12-02 DIAGNOSIS — R140 Abdominal distension (gaseous): Secondary | ICD-10-CM

## 2018-12-02 DIAGNOSIS — Z01419 Encounter for gynecological examination (general) (routine) without abnormal findings: Secondary | ICD-10-CM | POA: Diagnosis not present

## 2018-12-02 DIAGNOSIS — B977 Papillomavirus as the cause of diseases classified elsewhere: Secondary | ICD-10-CM | POA: Diagnosis not present

## 2018-12-02 NOTE — Progress Notes (Signed)
58 y.o. [id number] Married White or Caucasian female here for annual exam.  Doing well.  Diagnosed with invasive well differentiated adenosquamous carcinoma, 1.5cm.  Multiple lymph node biopsies were negative as well.  Had radiation with Dr. Lisbeth Renshaw.  Has appt with Dr. Benay Spice in August.  From his note, looks like she will have another CT but this is not scheduled.    Genetic testing has done and is negative.  Denies vaginal bleeding but is having some abdominal bloating.  Patient's last menstrual period was 04/07/2008.          Sexually active: Yes.    The current method of family planning is post menopausal status.    Exercising: No.   Smoker:  no  Health Maintenance: Pap:  07/19/17 Neg. HR HPV:+detected. #16, 18/45 neg   04/07/16 neg History of abnormal Pap:  yes MMG:  10/22/17 BIRADS1:neg. Has appt 01/07/19  Colonoscopy:  12/29/15 polyp. Dr. Collene Mares f/u 4 years  BMD:   12/30/14 normal  TDaP:  2010.  Aware this is due.  She is aware she should get this is has any exposure. Pneumonia vaccine(s):  n/a Shingrix:   Discussed today Hep C testing: 04/07/16 neg  Screening Labs: PCP   reports that she quit smoking about 5 years ago. Her smoking use included cigarettes. She quit after 8.00 years of use. She has never used smokeless tobacco. She reports current alcohol use of about 5.0 - 20.0 standard drinks of alcohol per week. She reports that she does not use drugs.  Past Medical History:  Diagnosis Date  . Anal cancer ([place]) 08/09/2012   Squamous cell  . Anxiety    PANIC ATTACKS  . Arthritis   . Breast cancer ([place]) 2009   ER+PR+HER-2+  . Ductal carcinoma ([place]) 02/2008    invasive   . Heart palpitations   . History of radiation therapy 09/02/12-10/10/12   anal 50.4Gy  . History of shingles 10/2014   . HPV (human papilloma virus) anogenital infection    vulvar/ freezing hx  . Hx of radiation therapy 2020  . Lung nodule   . Malignant neoplasm of upper lobe of left lung ([place]) 06/2016  . Personal  history of chemotherapy   . Personal history of radiation therapy 2010  . Pneumonia    NO RECENT PROBLEMS  . PONV (postoperative nausea and vomiting)    Hypotension  . Radiation 10/21/08-12/08/08   Right breast 6240 cGy  . Shingles 2017  . Status post chemotherapy 02/2008   Taxol/Herceptin    Past Surgical History:  Procedure Laterality Date  . BREAST BIOPSY Right 02/04/2008   malignant  . BREAST LUMPECTOMY Right 2010   malignant  . BREAST LUMPECTOMY WITH AXILLARY LYMPH NODE BIOPSY Right 5/10   Dr. Marlou Starks  . CESAREAN SECTION    . COLONOSCOPY W/ POLYPECTOMY    . EVALUATION UNDER ANESTHESIA WITH ANAL FISTULECTOMY N/A 08/09/2012   Procedure: EXAM UNDER ANESTHESIA AND BIOPSY OF ANAL MASS;  Surgeon: Odis Hollingshead, MD;  Location: [facility];  Service: General;  Laterality: N/A;  . KNEE ARTHROSCOPY Left    left knee  . PORTACATH PLACEMENT  03/2008   dr. Marlou Starks   . REMOVAL PORTACATH  2011  . VIDEO ASSISTED THORACOSCOPY (VATS)/WEDGE RESECTION Left 07/05/2016   Procedure: VIDEO ASSISTED THORACOSCOPY (VATS)/WEDGE RESECTION left upper lobe,  lymph node dissection and placement of OnQ catheter;  Surgeon: [name], MD;  Location: [facility];  Service: Thoracic;  Laterality: Left;  Marland Kitchen VIDEO BRONCHOSCOPY  N/A 07/05/2016   Procedure: VIDEO BRONCHOSCOPY;  Surgeon: [name], MD;  Location: [facility];  Service: Thoracic;  Laterality: N/A;  . VIDEO BRONCHOSCOPY N/A 05/03/2018   Procedure: VIDEO BRONCHOSCOPY;  Surgeon: [name], MD;  Location: [facility];  Service: Thoracic;  Laterality: N/A;  . VIDEO BRONCHOSCOPY WITH ENDOBRONCHIAL NAVIGATION N/A 05/03/2018   Procedure: VIDEO BRONCHOSCOPY WITH ENDOBRONCHIAL NAVIGATION WITH BIOPSY;  Surgeon: [name], MD;  Location: [place];  Service: Thoracic;  Laterality: N/A;  . VIDEO BRONCHOSCOPY WITH ENDOBRONCHIAL ULTRASOUND N/A 05/03/2018   Procedure: VIDEO BRONCHOSCOPY WITH ENDOBRONCHIAL ULTRASOUND;  Surgeon: [name], MD;  Location: [place];   Service: Thoracic;  Laterality: N/A;    Current Outpatient Medications  Medication Sig Dispense Refill  . albuterol (VENTOLIN HFA) 108 (90 Base) MCG/ACT inhaler Inhale 2 puffs into the lungs every 6 (six) hours as needed for wheezing or shortness of breath. 18 g 2  . CALCIUM PO Take 1 tablet by mouth daily.    Marland Kitchen loratadine (CLARITIN) 10 MG tablet Take 10 mg by mouth as needed for allergies.    . Multiple Vitamins-Minerals (MULTIVITAMIN WITH MINERALS) tablet Take 1 tablet by mouth daily.    Marland Kitchen LORazepam (ATIVAN) 1 MG tablet Take 1 tablet (1 mg total) by mouth every 8 (eight) hours. (Patient not taking: Reported on 12/02/2018) 30 tablet 0   No current facility-administered medications for this visit.     Family History  Problem Relation Age of Onset  . Lung cancer Father   . Stroke Mother   . Breast cancer Neg Hx     Review of Systems  All other systems reviewed and are negative.   Exam:   Temp 97.9 F (36.6 C) (Temporal)   Ht 5' 4.5" (1.638 m)   Wt 152 lb (68.9 kg)   LMP 04/07/2008   BMI 25.69 kg/m   Height:   Height: 5' 4.5" (163.8 cm)  Ht Readings from Last 3 Encounters:  12/02/18 5' 4.5" (1.638 m)  06/13/18 5' 5.5" (1.664 m)  06/07/18 _0  (1.676 m)    General appearance: alert, cooperative and appears stated age Head: Normocephalic, without obvious abnormality, atraumatic Neck: no adenopathy, supple, symmetrical, trachea midline and thyroid normal to inspection and palpation Lungs: clear to auscultation bilaterally Breasts: normal appearance, no masses or tenderness, well healed scar present, no LAD Heart: regular rate and rhythm Abdomen: soft, non-tender; bowel sounds normal; no masses,  no organomegaly Extremities: extremities normal, atraumatic, no cyanosis or edema Skin: Skin color, texture, turgor normal. No rashes or lesions Lymph nodes: Cervical, supraclavicular, and axillary nodes normal. No abnormal inguinal nodes palpated Neurologic: Grossly  normal   Pelvic: External genitalia:  no lesions              Urethra:  normal appearing urethra with no masses, tenderness or lesions              Bartholins and Skenes: normal                 Vagina: normal appearing vagina with normal color and discharge, no lesions              Cervix: no lesions              Pap taken: Yes.   Bimanual Exam:  Uterus:  normal size, contour, position, consistency, mobility, non-tender              Adnexa: normal adnexa and no mass, fullness, tenderness  Rectovaginal: Confirms               Anus:  normal sphincter tone, no lesions  Chaperone was present for exam.  A:  Well Woman with normal exam PMP, no HRT H/o invasive ductal carcinoma of right breast 2009, s/p lumpectomy/SNB,chemo/radiation.  Took tamoxifen for 5 years, then Arimidex for 5 more years and completed 6/19 H/o anal cancer s/p radiation completed 10/2012 H/o non-small cell lung cancer,wedge resection of left upper lung lob 07/01/16, negative margins (stage 1A) then enlarging nodules noted on CT and she underwent radiation March, 2020  P:   Mammogram guidelines reviewed pap smear with HR HPV obtained today Return for PUS Will have fasting Lipid, TSH, HbA1C and Vit D obtained with next OV BMD and colonoscopy are due in 2021 Desires Covid 19 antibody testing due to viral syndrome earlier this year.  Added to future lab work. return annually or prn

## 2018-12-03 ENCOUNTER — Telehealth: Payer: Self-pay | Admitting: Obstetrics & Gynecology

## 2018-12-03 NOTE — Telephone Encounter (Signed)
Spoke with patient. Advised per review of  12/02/18 AEX notes will have labs when she returns, ok to do day of PUS. Future orders placed by Dr. Sabra Heck. Patient verbalizes understanding and is agreeable.   Encounter closed.

## 2018-12-03 NOTE — Telephone Encounter (Signed)
Call placed to patient to review benefits and scheduled recommended ultrasound. Left voicemail message requesting a return call

## 2018-12-03 NOTE — Telephone Encounter (Signed)
Patient returned call to scheduled ultrasound. Reviewed benefit for ultrasound. Patient understood information presented. Patient is scheduled for 12/05/2018 with Dr Sabra Heck. Patient is aware of the appointment date, arrival time and cancellation policy.   Patient advises she will also need bloodwork and requested to have it done at the time of the ultrasound appointment.  Forwarding to Triage to review and confirm bloodwork needed.  Routing to Triage Nurse

## 2018-12-04 LAB — CYTOLOGY - PAP
Diagnosis: NEGATIVE
HPV 16/18/45 genotyping: NEGATIVE
HPV: DETECTED — AB

## 2018-12-05 ENCOUNTER — Encounter: Payer: Self-pay | Admitting: Obstetrics & Gynecology

## 2018-12-05 ENCOUNTER — Ambulatory Visit: Payer: BC Managed Care – PPO | Admitting: Obstetrics & Gynecology

## 2018-12-05 ENCOUNTER — Other Ambulatory Visit: Payer: Self-pay | Admitting: *Deleted

## 2018-12-05 ENCOUNTER — Ambulatory Visit (INDEPENDENT_AMBULATORY_CARE_PROVIDER_SITE_OTHER): Payer: BC Managed Care – PPO

## 2018-12-05 ENCOUNTER — Other Ambulatory Visit: Payer: Self-pay

## 2018-12-05 VITALS — BP 96/68 | HR 80 | Temp 97.6°F | Ht 64.5 in | Wt 150.0 lb

## 2018-12-05 DIAGNOSIS — Z0000 Encounter for general adult medical examination without abnormal findings: Secondary | ICD-10-CM

## 2018-12-05 DIAGNOSIS — B977 Papillomavirus as the cause of diseases classified elsewhere: Secondary | ICD-10-CM | POA: Diagnosis not present

## 2018-12-05 DIAGNOSIS — B349 Viral infection, unspecified: Secondary | ICD-10-CM

## 2018-12-05 DIAGNOSIS — R140 Abdominal distension (gaseous): Secondary | ICD-10-CM | POA: Diagnosis not present

## 2018-12-05 NOTE — Progress Notes (Signed)
58 y.o. [id number] Married White or Caucasian female here for pelvic ultrasound due to increased bloating.  Her most recent pap smear was negative but HR HPV was still present.  According to new ASCCP guidelines, her risk of CIN 3 is >4% so colposcopy is recommended.  Patient's last menstrual period was 04/07/2008.  Contraception: PMP  Findings:  UTERUS: 6.8 x 4.0 x 2.6cm with two small calcified fibroids, 0.8 and 0.4cm EMS: 1.62mm ADNEXA: Left ovary:  1.7 x 0.8 x 0.8cm       Right ovary: 1.8 x 0.9 x 0.6 CUL DE SAC: no free fluid  Discussion:  Findings reviewed with pt.  Calcified fibroids noted.  Normal ovaries noted.  Discussed with pt possible expanded genetic testing.  Pros/cons discussed.  She will plan to discuss with Dr. Benay Spice at next visit.  Assessment:  Breast cancer, anal cancer, lung cancer hx Abdominal bloating with normal ultrasound  Plan:  Blood work scheduled for today.  Orders have already been placed. Considering expanded genetic testing.  Will let me know if needs referral.  ~15 minutes spent with patient >50% of time was in face to face discussion of above.

## 2018-12-06 LAB — LIPID PANEL
Chol/HDL Ratio: 2.6 ratio (ref 0.0–4.4)
Cholesterol, Total: 224 mg/dL — ABNORMAL HIGH (ref 100–199)
HDL: 87 mg/dL (ref >39)
LDL Calculated: 124 mg/dL — ABNORMAL HIGH (ref 0–99)
Triglycerides: 65 mg/dL (ref 0–149)
VLDL Cholesterol Cal: 13 mg/dL (ref 5–40)

## 2018-12-06 LAB — SAR COV2 SEROLOGY (COVID19)AB(IGG),IA: SARS-CoV-2 Ab, IgG: NEGATIVE

## 2018-12-06 LAB — HEMOGLOBIN A1C
Est. average glucose Bld gHb Est-mCnc: 120 mg/dL
Hgb A1c MFr Bld: 5.8 % — ABNORMAL HIGH (ref 4.8–5.6)

## 2018-12-06 LAB — TSH: TSH: 1.34 u[IU]/mL (ref 0.450–4.500)

## 2018-12-06 LAB — VITAMIN D 25 HYDROXY (VIT D DEFICIENCY, FRACTURES): Vit D, 25-Hydroxy: 39.9 ng/mL (ref 30.0–100.0)

## 2018-12-10 ENCOUNTER — Telehealth: Payer: Self-pay | Admitting: Obstetrics & Gynecology

## 2018-12-10 NOTE — Telephone Encounter (Signed)
Call placed to convey benefits for colposcopy. Spoke with patient she understands/agreeable with benefits. Appointment scheduled 12/23/18.

## 2018-12-20 ENCOUNTER — Ambulatory Visit: Payer: BLUE CROSS/BLUE SHIELD | Admitting: Oncology

## 2018-12-20 ENCOUNTER — Other Ambulatory Visit: Payer: Self-pay

## 2018-12-20 ENCOUNTER — Encounter: Payer: Self-pay | Admitting: *Deleted

## 2018-12-23 ENCOUNTER — Telehealth: Payer: Self-pay | Admitting: *Deleted

## 2018-12-23 ENCOUNTER — Other Ambulatory Visit: Payer: Self-pay

## 2018-12-23 ENCOUNTER — Encounter: Payer: Self-pay | Admitting: Obstetrics & Gynecology

## 2018-12-23 ENCOUNTER — Ambulatory Visit (INDEPENDENT_AMBULATORY_CARE_PROVIDER_SITE_OTHER): Payer: BC Managed Care – PPO | Admitting: Obstetrics & Gynecology

## 2018-12-23 ENCOUNTER — Encounter: Payer: Self-pay | Admitting: *Deleted

## 2018-12-23 VITALS — BP 96/58 | HR 80 | Temp 97.9°F | Ht 64.5 in | Wt 152.0 lb

## 2018-12-23 DIAGNOSIS — B977 Papillomavirus as the cause of diseases classified elsewhere: Secondary | ICD-10-CM | POA: Diagnosis not present

## 2018-12-23 DIAGNOSIS — A630 Anogenital (venereal) warts: Secondary | ICD-10-CM | POA: Diagnosis not present

## 2018-12-23 DIAGNOSIS — C3412 Malignant neoplasm of upper lobe, left bronchus or lung: Secondary | ICD-10-CM

## 2018-12-23 MED ORDER — LORAZEPAM 1 MG PO TABS: 1.0000 mg | ORAL_TABLET | Freq: Every day | ORAL | 1 refills | Status: DC

## 2018-12-23 NOTE — Progress Notes (Signed)
58 y.o. Married white female here for colposcopy with possible biopsies and/or ECC due to +HR HPV with neg Pap obtained 12/02/2018.    Having increased stressors.  Requests Ativan refill.  Prior evaluation/treatment:  H/o +HR HPV on prior pap smears.  With current guidelines changes, colposcopy was recommended this year.  Pt is aware this is reasoning for colposcopy this year and not in the past..  Patient's last menstrual period was 04/07/2008.          Sexually active: Yes.    The current method of family planning is post menopausal status.     Patient has been counseled about results and procedure.  Risks and benefits have bene reviewed including immediate and/or delayed bleeding, infection, cervical scaring from procedure, possibility of needing additional follow up as well as treatment.  rare risks of missing a lesion discussed as well.  All questions answered.  Pt ready to proceed.  BP (!) 96/58   Pulse 80   Temp 97.9 F (36.6 C) (Temporal)   Ht 5' 4.5" (1.638 m)   Wt 152 lb (68.9 kg)   LMP 04/07/2008   BMI 25.69 kg/m   Physical Exam  Constitutional: She is oriented to person, place, and time. She appears well-developed and well-nourished.  Genitourinary:     Neurological: She is alert and oriented to person, place, and time.  Skin: Skin is warm and dry.  Psychiatric: She has a normal mood and affect.    Speculum placed.  3% acetic acid applied to cervix for >45 seconds.  Cervix visualized with both 7.5X and 15X magnification.  Green filter also used.  Lugols solution was used.  Findings:  Atrophic vaginal changes.  Endocervical transition zone noted.  This was fully visualized.  No abnormal changes with acetic acid or Lugol's solution.  Biopsy:  None obtained.  ECC:  was performed.  Monsel's was not needed.  Excellent hemostasis was present.  Pt tolerated procedure well and all instruments were removed.  Findings noted above on picture of cervix.  Assessment:  +HR HPV with  negative pap smear in pt with prior hx of anal cancer, breast cancer, and lung cancer  Plan:  Pathology results will be called to patient and follow-up planned pending results.

## 2018-12-23 NOTE — Telephone Encounter (Signed)
Oncology Nurse Navigator Documentation  Oncology Nurse Navigator Flowsheets 12/23/2018  Navigator Location [facility]  Navigator Encounter Type Telephone/I received a message from patient that she needs scan at GI due to insurance.  Order changed.  I called patient to update.   Telephone Outgoing Call  Patient Visit Type -  Treatment Phase Abnormal Scans  Barriers/Navigation Needs Coordination of Care  Education -  Interventions Coordination of Care  Coordination of Care Other  Education Method -  Acuity Level 2  Acuity Level 2 -  Time Spent with Patient 15

## 2018-12-23 NOTE — Patient Instructions (Signed)

## 2018-12-24 ENCOUNTER — Telehealth: Payer: Self-pay | Admitting: *Deleted

## 2018-12-24 NOTE — Telephone Encounter (Signed)
Scan approved by [employer]. Scheduled at [facility] for 8/19 at 1000/1020. Notified patient via voice mail and MyChart message. No solid foods for 4 hours prior to scan. Requested confirmation. There was no availability on 12/26/18 before her visit on 12/27/18.

## 2018-12-25 ENCOUNTER — Ambulatory Visit
Admission: RE | Admit: 2018-12-25 | Discharge: 2018-12-25 | Disposition: A | Discharge status: Home / Self Care | Payer: BC Managed Care – PPO | Source: Ambulatory Visit | Attending: Oncology | Admitting: Oncology

## 2018-12-25 ENCOUNTER — Ambulatory Visit (HOSPITAL_COMMUNITY): Payer: BC Managed Care – PPO

## 2018-12-25 DIAGNOSIS — J432 Centrilobular emphysema: Secondary | ICD-10-CM | POA: Diagnosis not present

## 2018-12-25 DIAGNOSIS — C3412 Malignant neoplasm of upper lobe, left bronchus or lung: Secondary | ICD-10-CM

## 2018-12-25 DIAGNOSIS — C3490 Malignant neoplasm of unspecified part of unspecified bronchus or lung: Secondary | ICD-10-CM | POA: Diagnosis not present

## 2018-12-25 MED ORDER — IOPAMIDOL (ISOVUE-300) INJECTION 61%
75.0000 mL | Freq: Once | INTRAVENOUS | Status: AC | PRN
  Administered 2018-12-25: 75 mL via INTRAVENOUS

## 2018-12-27 ENCOUNTER — Inpatient Hospital Stay: Payer: BC Managed Care – PPO | Attending: Oncology | Admitting: Oncology

## 2018-12-27 ENCOUNTER — Other Ambulatory Visit: Payer: Self-pay

## 2018-12-27 VITALS — BP 109/59 | HR 87 | Temp 98.5°F | Resp 18 | Ht 64.5 in | Wt 150.6 lb

## 2018-12-27 DIAGNOSIS — Z9221 Personal history of antineoplastic chemotherapy: Secondary | ICD-10-CM | POA: Diagnosis not present

## 2018-12-27 DIAGNOSIS — Z853 Personal history of malignant neoplasm of breast: Secondary | ICD-10-CM | POA: Diagnosis not present

## 2018-12-27 DIAGNOSIS — Z85118 Personal history of other malignant neoplasm of bronchus and lung: Secondary | ICD-10-CM | POA: Insufficient documentation

## 2018-12-27 DIAGNOSIS — Z170 Estrogen receptor positive status [ER+]: Secondary | ICD-10-CM | POA: Insufficient documentation

## 2018-12-27 DIAGNOSIS — Z923 Personal history of irradiation: Secondary | ICD-10-CM | POA: Diagnosis not present

## 2018-12-27 DIAGNOSIS — C3412 Malignant neoplasm of upper lobe, left bronchus or lung: Secondary | ICD-10-CM

## 2018-12-27 DIAGNOSIS — Z85048 Personal history of other malignant neoplasm of rectum, rectosigmoid junction, and anus: Secondary | ICD-10-CM | POA: Diagnosis not present

## 2018-12-27 NOTE — Progress Notes (Signed)
[place] OFFICE PROGRESS NOTE   Diagnosis: Lung cancer, breast cancer, anal cancer  INTERVAL HISTORY:   [name] returns as scheduled.  She feels well.  Good appetite.  No difficulty with bowel function.  She has dyspnea with the hot/humid weather.  She underwent a Pap smear last month and a colposcopy earlier this week.  She is scheduled for a mammogram next month.  Objective:  Vital signs in last 24 hours:  Blood pressure (!) 109/59, pulse 87, temperature 98.5 F (36.9 C), temperature source Oral, resp. rate 18, height 5' 4.5" (1.638 m), weight 150 lb 9.6 oz (68.3 kg), last menstrual period 04/07/2008, SpO2 94 %.   Physical examination-declined by patient  Lab Results:  Lab Results  Component Value Date   WBC 5.2 06/28/2018   HGB 15.5 (H) 06/28/2018   HCT 45.8 06/28/2018   MCV 103.6 (H) 06/28/2018   PLT 213 06/28/2018   NEUTROABS 3.3 06/28/2018    CMP  Lab Results  Component Value Date   NA 140 05/03/2018   K 4.1 05/03/2018   CL 97 (L) 05/03/2018   CO2 31 05/03/2018   GLUCOSE 111 (H) 05/03/2018   BUN 9 05/03/2018   CREATININE 0.62 05/03/2018   CALCIUM 9.2 05/03/2018   PROT 7.2 05/03/2018   ALBUMIN 3.8 05/03/2018   AST 25 05/03/2018   ALT 18 05/03/2018   ALKPHOS 59 05/03/2018   BILITOT 1.2 05/03/2018   GFRNONAA >60 05/03/2018   GFRAA >60 05/03/2018      Ct Chest W Contrast  Result Date: 12/25/2018 CLINICAL DATA:  Lung cancer, metastatic. History of breast and anal cancer. EXAM: CT CHEST WITH CONTRAST TECHNIQUE: Multidetector CT imaging of the chest was performed during intravenous contrast administration. CONTRAST:  55m ISOVUE-300 IOPAMIDOL (ISOVUE-300) INJECTION 61% COMPARISON:  09/16/2018 and 11/14/2016. FINDINGS: Cardiovascular: Atherosclerotic calcification of the aorta and coronary arteries. Pulmonic trunk is enlarged. Heart size normal. No pericardial effusion. Mediastinum/Nodes: Mediastinal lymph nodes measure up to 9 mm in the low  right paratracheal station, as before. Hilar lymph nodes measure up to 11 mm on the right, also stable. No axillary adenopathy. Esophagus is grossly unremarkable. Lungs/Pleura: Mild centrilobular and paraseptal emphysema. Nodule in the medial aspect of the apical segment right upper lobe has decreased in size slightly, now measuring 1.3 x 1.6 cm (3/44), previously 1.8 x 2.0 cm. 10 mm nodule in the medial aspect of posterior segment right upper lobe is stable (3/69). Additional peribronchovascular nodularity and ground-glass in the right upper lobe, similar. Subpleural radiation fibrosis in the anterior right lung. Spiculated nodule in the anterior right lower lobe measures 1.2 x 2.7 cm (3/93), stable when remeasured in a similar fashion. Postoperative changes in the left upper lobe with surrounding scarring, as before. Areas of ground-glass nodularity in the left upper and left lower lobes are stable. However, all lesions are new or enlarging when compared with 11/14/2016. No pleural fluid. Airway is unremarkable. Upper Abdomen: Subcentimeter low-attenuation lesions in the liver are unchanged. Visualized portions of the liver, adrenal glands, kidneys, spleen, pancreas, stomach and bowel are otherwise unremarkable. No upper abdominal adenopathy. Musculoskeletal: No worrisome lytic or sclerotic lesions. Mild degenerative changes in the spine. IMPRESSION: 1. Spiculated right upper lobe nodule has decreased in size in the interval. Multiple additional solid and ground-glass nodules in the lungs bilaterally are stable from 09/16/2018 but are new or larger than on 11/14/2016, indicative of multifocal adenocarcinoma. 2. Aortic atherosclerosis (ICD10-170.0). Coronary artery calcification. 3. Enlarged pulmonic trunk indicative  of pulmonary arterial hypertension. 4.  Emphysema (ICD10-J43.9). Electronically Signed   By: Lorin Picket M.D.   On: 12/25/2018 12:14    Medications: I have reviewed the patient's current  medications.   Assessment/Plan: .Anal cancer-left anal margin/anal canal mass, status post a biopsy on 08/09/2012 confirming invasive moderately differentiated squamous cell carcinoma,p16 positive.  -Staging PET scan 08/20/2012-hypermetabolic anal mass, no hypermetabolic metastases  -Initiation of concurrent radiation with cycle 1 of mitomycin C./5-fluorouracil 09/02/2012.  -She completed cycle 2 5-fluorouracil/mitomycin C. beginning 10/01/2012.  -She completed radiation 10/10/2012.  2. Right breast cancer 2009, ER positive, PR positive, HER-2 positive. She is status post neoadjuvant AC x4 cycles followed by Taxol/Herceptin. She underwent a right lumpectomy and sentinel lymph node biopsy 09/22/2008 with no evidence of residual invasive carcinoma and 5 negative sentinel lymph nodes. She then completed right breast radiation. She began tamoxifen 12/15/2008 and completed adjuvant Herceptin 05/25/2009. The tamoxifen was discontinued and she was switched to Arimidex in July 2013. Arimidex discontinued in mid July 2019. 3. History of enlargement of the right tonsil.  4. Nonspecific pulmonary nodules without hypermetabolic activity on the PET scan 08/20/2012. Chest CT 10/21/2012 with scattered pulmonary nodules including nodules that were not present in 2010. Annual surveillance was recommended. Follow-up chest CT 10/24/2013 with scattered groundglass densities again noted in both lungs mostly unchanged when compared to the previous study. A sub-solid right lower lobe nodule had increased central density. The size was unchanged.The nodule has been present since 2009.  CT chest 06/13/2016-a sub-solid 9 mm right upper lobe nodule has enlarged compared to 2015, a right lower lobe irregular nodular density appears more well defined, new vague 5 mm groundglass left upper lobe nodule, left lower lobe 10 mm groundglass nodule is more apparent, posterior left upper lobe circumscribed solid nodule measures 11 x  10 mm and is new  PET scan 06/22/2016-malignant range activity involving the 11 mm left upper lobe nodule, no other hypermetabolic nodules, suspicious sub-solid right lower lobe nodule  Wedge resection of a hypermetabolic left upper lobe nodule on 07/05/2016-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 No EGFR, ALK, BRAF, RET, ERBB2, or ROS1 alteration  CT chest 11/14/2016-status post left upper lobe wedge resection, stable right lung nodules  Chest CT 05/02/2017-stable bilateral solid and groundglass nodules, stable mild mediastinal lymphadenopathy  CT chest 12/13/2017- enlargement of right upper lobe nodule, other lung nodules and chest lymph node stable  PET 01/09/2018- right upper lobe nodule and right lower lobe nodule have enlarged since 2018 and have low level metabolic activity increased size of an 8 mm right upper lobe nodule without increased metabolic activity, stable activity and a mildly enlarged lower paratracheal node, no evidence of metastatic disease in the abdomen or pelvis  CT chest 04/22/2018- no new lung nodules, dominant right lung nodules slightly increased in size  Bronchoscopy/EBUS 05/04/2019 with biopsy of level 7 node, biopsy of right lower lobe nodule, FNA of right lower lobe nodule-negative  CT biopsy of right upper lobe nodule 06/07/2018- adenocarcinoma with lepidic and acinar patterns  SBRT to the dominant right upper lobe nodule 07/23/2018-07/30/2018  CT 09/16/2018-slight increase in size of medial right upper lobe nodule-possibly secondary to interval radiation, other nodules are unchanged  CT 12/25/2018- medial right upper lobe nodule has decreased in size slightly, multiple additional solid and groundglass nodules are stable       Disposition: Ms. Lamarca remains in clinical  remission from anal cancer and breast cancer.  She has  multifocal lung cancer.  She appears asymptomatic and the restaging chest CT reveals no evidence of disease progression.  She will return for an office visit and chest CT in 6 months.  We reviewed the CT images and discussed treatment options.  She may be a candidate for additional SBRT if a dominant nodule enlarges.  We will consider systemic treatment options in the future.  She continues anal and breast examinations with Dr. Sabra Heck.  25 minutes were spent with the patient today.  The majority of the time was used for counseling and coordination of care.   Betsy Coder, MD  12/27/2018  10:06 AM

## 2018-12-30 ENCOUNTER — Telehealth: Payer: Self-pay | Admitting: Oncology

## 2018-12-30 NOTE — Telephone Encounter (Signed)
Called and spoke with patient. Confirmed feb 2021 appt

## 2019-01-07 ENCOUNTER — Ambulatory Visit
Admission: RE | Admit: 2019-01-07 | Discharge: 2019-01-07 | Disposition: A | Discharge status: Home / Self Care | Payer: BC Managed Care – PPO | Source: Ambulatory Visit | Attending: Oncology | Admitting: Oncology

## 2019-01-07 ENCOUNTER — Other Ambulatory Visit: Payer: Self-pay

## 2019-01-07 DIAGNOSIS — Z1231 Encounter for screening mammogram for malignant neoplasm of breast: Secondary | ICD-10-CM | POA: Diagnosis not present

## 2019-01-16 ENCOUNTER — Telehealth: Payer: Self-pay | Admitting: *Deleted

## 2019-01-16 NOTE — Telephone Encounter (Signed)
Records faxed to [facility] - att Vida Rigger - Release [id number]

## 2019-02-13 DIAGNOSIS — R300 Dysuria: Secondary | ICD-10-CM | POA: Diagnosis not present

## 2019-03-17 ENCOUNTER — Encounter: Payer: Self-pay | Admitting: Oncology

## 2019-06-10 ENCOUNTER — Ambulatory Visit
Admission: RE | Admit: 2019-06-10 | Discharge: 2019-06-10 | Disposition: A | Discharge status: Home / Self Care | Payer: BC Managed Care – PPO | Source: Ambulatory Visit | Attending: Oncology | Admitting: Oncology

## 2019-06-10 DIAGNOSIS — C3412 Malignant neoplasm of upper lobe, left bronchus or lung: Secondary | ICD-10-CM

## 2019-06-11 ENCOUNTER — Other Ambulatory Visit: Payer: Self-pay | Admitting: *Deleted

## 2019-06-11 ENCOUNTER — Other Ambulatory Visit: Payer: Self-pay

## 2019-06-11 ENCOUNTER — Inpatient Hospital Stay: Payer: BC Managed Care – PPO | Attending: Oncology | Admitting: Oncology

## 2019-06-11 ENCOUNTER — Encounter: Payer: Self-pay | Admitting: *Deleted

## 2019-06-11 VITALS — BP 123/83 | HR 88 | Temp 98.9°F | Resp 16 | Ht 64.5 in | Wt 145.4 lb

## 2019-06-11 DIAGNOSIS — Z170 Estrogen receptor positive status [ER+]: Secondary | ICD-10-CM | POA: Insufficient documentation

## 2019-06-11 DIAGNOSIS — R918 Other nonspecific abnormal finding of lung field: Secondary | ICD-10-CM | POA: Insufficient documentation

## 2019-06-11 DIAGNOSIS — Z853 Personal history of malignant neoplasm of breast: Secondary | ICD-10-CM | POA: Insufficient documentation

## 2019-06-11 DIAGNOSIS — Z923 Personal history of irradiation: Secondary | ICD-10-CM | POA: Diagnosis not present

## 2019-06-11 DIAGNOSIS — Z85048 Personal history of other malignant neoplasm of rectum, rectosigmoid junction, and anus: Secondary | ICD-10-CM | POA: Diagnosis not present

## 2019-06-11 DIAGNOSIS — C3412 Malignant neoplasm of upper lobe, left bronchus or lung: Secondary | ICD-10-CM

## 2019-06-11 DIAGNOSIS — Z9221 Personal history of antineoplastic chemotherapy: Secondary | ICD-10-CM | POA: Diagnosis not present

## 2019-06-11 DIAGNOSIS — J449 Chronic obstructive pulmonary disease, unspecified: Secondary | ICD-10-CM | POA: Insufficient documentation

## 2019-06-11 MED ORDER — ALBUTEROL SULFATE HFA 108 (90 BASE) MCG/ACT IN AERS: 2.0000 | INHALATION_SPRAY | Freq: Four times a day (QID) | RESPIRATORY_TRACT | 2 refills | Status: DC | PRN

## 2019-06-11 NOTE — Progress Notes (Signed)
Oncology Nurse Navigator Documentation  Oncology Nurse Navigator Flowsheets 06/11/2019  Navigator Location [facility]  Navigator Encounter Type Other/Dr. Benay Spice requested patient be discussed at cancer conference next week.  I will update path to put her case on for discussion.  I also updated Dr. Servando Snare on next weeks discussion.  Dr. Benay Spice requested an update on [facility] results.  I printed these results and placed on his desk as well as sent him in basket on results.   Telephone -  Patient Visit Type -  Treatment Phase Other  Barriers/Navigation Needs Coordination of Care  Education -  Interventions Coordination of Care  Acuity Level 2-Minimal Needs (1-2 Barriers Identified)  Coordination of Care Other  Education Method -  Time Spent with Patient 45

## 2019-06-11 NOTE — Progress Notes (Signed)
[place] OFFICE PROGRESS NOTE   Diagnosis: Non-small cell lung cancer, breast cancer, anal cancer  INTERVAL HISTORY:   Gina Cervantes returns as scheduled.  She feels well.  Good appetite.  She relates weight loss to a change in her diet and exercise.  She has noted increased COPD symptoms with the cold weather, relieved with an albuterol inhaler.  No other complaint. A mammogram on 01/07/2019 was negative.  She continues breast and GYN examinations with Dr. Sabra Heck. Objective:  Vital signs in last 24 hours:  Blood pressure 123/83, pulse 88, temperature 98.9 F (37.2 C), temperature source Temporal, resp. rate 16, height 5' 4.5" (1.638 m), weight 145 lb 6.4 oz (66 kg), last menstrual period 04/07/2008, SpO2 94 %.    Limited physical examination secondary to patient preference Lymphatics: No cervical or supraclavicular nodes  Lab Results:  Lab Results  Component Value Date   WBC 5.2 06/28/2018   HGB 15.5 (H) 06/28/2018   HCT 45.8 06/28/2018   MCV 103.6 (H) 06/28/2018   PLT 213 06/28/2018   NEUTROABS 3.3 06/28/2018    CMP  Lab Results  Component Value Date   NA 140 05/03/2018   K 4.1 05/03/2018   CL 97 (L) 05/03/2018   CO2 31 05/03/2018   GLUCOSE 111 (H) 05/03/2018   BUN 9 05/03/2018   CREATININE 0.62 05/03/2018   CALCIUM 9.2 05/03/2018   PROT 7.2 05/03/2018   ALBUMIN 3.8 05/03/2018   AST 25 05/03/2018   ALT 18 05/03/2018   ALKPHOS 59 05/03/2018   BILITOT 1.2 05/03/2018   GFRNONAA >60 05/03/2018   GFRAA >60 05/03/2018     Imaging:  CT CHEST WO CONTRAST  Result Date: 06/10/2019 CLINICAL DATA:  Restaging lung cancer. History of left upper lobe neoplasm post pulmonary resection and chemo radiotherapy. Patient also with history of breast and anal cancer EXAM: CT CHEST WITHOUT CONTRAST TECHNIQUE: Multidetector CT imaging of the chest was performed following the standard protocol without IV contrast. COMPARISON:  12/25/2018 FINDINGS: Cardiovascular:  Scattered calcified atherosclerotic change of the thoracic aorta. Vascular structures not well assessed given lack of intravenous contrast. Main pulmonary artery dilated to 3.5 cm indicative of pulmonary arterial hypertension. No pericardial effusion. Heart size is stable and normal. Mediastinum/Nodes: Right paratracheal lymph node measures 9 mm. Scattered small thoracic inlet lymph nodes none with pathologic enlargement. Right hilar lymph node measuring 14 mm short axis previously 12 mm. Subcarinal nodal tissue is stable. Esophagus is normal. No axillary lymphadenopathy. Lungs/Pleura: Enlarging right lower lobe pulmonary nodule along the fissure measures 2.6 x 1.4 x 1.8 cm as compared to 2.6 x 1.2 by 0.9 cm (image 81, series 3) (Image 59, series 3) 10 mm pulmonary nodule in the right upper lobe along the superior margin of the major fissure is unchanged, within 1 mm of previous measurement Nodularity and thickening along the periphery of the right upper lobe show some surrounding ground-glass attenuation and septal thickening appearing smaller than on the previous exam measuring approximately 11 mm by 13 mm as compared to 15 x 13 mm but associated with pleural thickening along its deep margin it measures approximately 6 mm greatest thickness. Scattered small nodules are unchanged with the exception of a new nodule in the anterior right upper lobe (image 38, series 3) signs of subpleural reticulation along the anterior right chest are unchanged. Postoperative changes in the left upper lobe are similar to the previous study. Patchy areas of ground-glass opacity and branching nodularity in the left upper lobe  with associated ground-glass features, largest area 1.2 cm unchanged. Ground-glass opacity in the left lung base, subsolid nodule (image 91, series 3) 1.4 cm previously 1.2 cm greatest axial dimension No signs of pleural effusion. Signs of centrilobular and paraseptal emphysema as before. Upper Abdomen:  Incidental imaging of upper abdominal contents is unremarkable. Adrenal glands are normal. Musculoskeletal: No acute bone finding or destructive bone process IMPRESSION: 1. Enlarging right lower lobe pulmonary nodule along the fissure measuring 2.6 x 1.4 x 1.8 cm as compared to 2.6 x 1.2 x 0.9 cm. Findings are concerning for worsening of disease. 2. Little change with respect to right upper lobe nodule perhaps slightly smaller but associated with underlying pleural thickening and surrounding ground-glass attenuation likely related to prior radiation. 3. Presumed stigmata of multifocal adenocarcinoma elsewhere in the chest with new nodule in the right upper lobe and enlarging sub solid nodule in the left lung base. 4. Postoperative changes in the left upper lobe as before. 5.  mediastinal and right hilar lymph nodes. 6. Dilated main pulmonary artery indicative of pulmonary arterial hypertension. Aortic Atherosclerosis (ICD10-I70.0) and Emphysema (ICD10-J43.9). Electronically Signed   By: Zetta Bills M.D.   On: 06/10/2019 14:34    Medications: I have reviewed the patient's current medications.   Assessment/Plan: 1.Anal cancer-left anal margin/anal canal mass, status post a biopsy on 08/09/2012 confirming invasive moderately differentiated squamous cell carcinoma,p16 positive.  -Staging PET scan 08/20/2012-hypermetabolic anal mass, no hypermetabolic metastases  -Initiation of concurrent radiation with cycle 1 of mitomycin C./5-fluorouracil 09/02/2012.  -She completed cycle 2 5-fluorouracil/mitomycin C. beginning 10/01/2012.  -She completed radiation 10/10/2012.  2. Right breast cancer 2009, ER positive, PR positive, HER-2 positive. She is status post neoadjuvant AC x4 cycles followed by Taxol/Herceptin. She underwent a right lumpectomy and sentinel lymph node biopsy 09/22/2008 with no evidence of residual invasive carcinoma and 5 negative sentinel lymph nodes. She then completed right breast  radiation. She began tamoxifen 12/15/2008 and completed adjuvant Herceptin 05/25/2009. The tamoxifen was discontinued and she was switched to Arimidex in July 2013. Arimidex discontinued in mid July 2019. 3. History of enlargement of the right tonsil.  4. Nonspecific pulmonary nodules without hypermetabolic activity on the PET scan 08/20/2012. Chest CT 10/21/2012 with scattered pulmonary nodules including nodules that were not present in 2010. Annual surveillance was recommended. Follow-up chest CT 10/24/2013 with scattered groundglass densities again noted in both lungs mostly unchanged when compared to the previous study. A sub-solid right lower lobe nodule had increased central density. The size was unchanged.The nodule has been present since 2009.  CT chest 06/13/2016-a sub-solid 9 mm right upper lobe nodule has enlarged compared to 2015, a right lower lobe irregular nodular density appears more well defined, new vague 5 mm groundglass left upper lobe nodule, left lower lobe 10 mm groundglass nodule is more apparent, posterior left upper lobe circumscribed solid nodule measures 11 x 10 mm and is new  PET scan 06/22/2016-malignant range activity involving the 11 mm left upper lobe nodule, no other hypermetabolic nodules, suspicious sub-solid right lower lobe nodule  Wedge resection of a hypermetabolic left upper lobe nodule on 07/05/2016-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 No EGFR, ALK, BRAF, RET, ERBB2, or ROS1 alteration  CT chest 11/14/2016-status post left upper lobe wedge resection, stable right lung nodules  Chest CT 05/02/2017-stable bilateral solid and groundglass nodules, stable mild mediastinal lymphadenopathy  CT chest 12/13/2017- enlargement of  right upper lobe nodule, other lung nodules and chest lymph node stable  PET 01/09/2018- right upper lobe nodule  and right lower lobe nodule have enlarged since 2018 and have low level metabolic activity increased size of an 8 mm right upper lobe nodule without increased metabolic activity, stable activity and a mildly enlarged lower paratracheal node, no evidence of metastatic disease in the abdomen or pelvis  CT chest 04/22/2018- no new lung nodules, dominant right lung nodules slightly increased in size  Bronchoscopy/EBUS 05/04/2019 with biopsy of level 7 node, biopsy of right lower lobe nodule, FNA of right lower lobe nodule-negative  CT biopsy of right upper lobe nodule 06/07/2018- adenocarcinoma with lepidic and acinar patterns  SBRT to the dominant right upper lobe nodule 07/23/2018-07/30/2018  CT 09/16/2018-slight increase in size of medial right upper lobe nodule-possibly secondary to interval radiation, other nodules are unchanged  CT 12/25/2018- medial right upper lobe nodule has decreased in size slightly, multiple additional solid and groundglass nodules are stable  CT 06/09/2019-enlarging right lower lobe nodule, enlarging subsolid nodule, new right upper lobe nodule     Disposition: Gina Cervantes is in clinical remission from anal cancer and breast cancer.  She continues breast and GYN exams with Dr. Sabra Heck.  The restaging chest CT reveals slight progression of multifocal lung lesions.  I reviewed the CT images.  We discussed treatment options including continued observation, referral to consider SBRT to the dominant right, and systemic therapy.  She feels well at present.  She prefers observation.  She will be scheduled for an office visit and repeat chest CT in 6 months.  I will present her case at the thoracic tumor conference next week.  We will contact her if there is a different consensus recommendation.    Betsy Coder, MD  06/11/2019  3:27 PM

## 2019-06-12 ENCOUNTER — Ambulatory Visit: Payer: BC Managed Care – PPO | Admitting: Oncology

## 2019-06-12 ENCOUNTER — Telehealth: Payer: Self-pay | Admitting: Oncology

## 2019-06-12 NOTE — Telephone Encounter (Signed)
Scheduled per los. Called and left msg. Mailed printout  °

## 2019-06-19 ENCOUNTER — Other Ambulatory Visit: Payer: Self-pay | Admitting: *Deleted

## 2019-06-19 ENCOUNTER — Telehealth: Payer: Self-pay | Admitting: *Deleted

## 2019-06-19 DIAGNOSIS — C3411 Malignant neoplasm of upper lobe, right bronchus or lung: Secondary | ICD-10-CM

## 2019-06-19 NOTE — Telephone Encounter (Signed)
Left VM that case presented in tumor board today--suggested SBRT to the dominant right lung lesion. Radiation oncology will be contacting her for an appointment. Also sent Mychart message with same information. Referral order placed in EPIC

## 2019-06-19 NOTE — Progress Notes (Signed)
The proposed treatment discussed in cancer conference 06/19/19 is for discussion purpose only and is not a binding recommendation.  The patient was not physically examined nor present for their treatment options.  Therefore, final treatment plans cannot be decided.  

## 2019-06-25 ENCOUNTER — Ambulatory Visit: Payer: BC Managed Care – PPO | Admitting: Radiation Oncology

## 2019-06-25 ENCOUNTER — Other Ambulatory Visit: Payer: BC Managed Care – PPO

## 2019-06-26 ENCOUNTER — Encounter: Payer: Self-pay | Admitting: Radiation Oncology

## 2019-06-26 ENCOUNTER — Other Ambulatory Visit: Payer: Self-pay

## 2019-06-26 ENCOUNTER — Ambulatory Visit
Admission: RE | Admit: 2019-06-26 | Discharge: 2019-06-26 | Disposition: A | Discharge status: Home / Self Care | Payer: BC Managed Care – PPO | Source: Ambulatory Visit | Attending: Radiation Oncology | Admitting: Radiation Oncology

## 2019-06-26 DIAGNOSIS — C3412 Malignant neoplasm of upper lobe, left bronchus or lung: Secondary | ICD-10-CM

## 2019-06-26 DIAGNOSIS — C50411 Malignant neoplasm of upper-outer quadrant of right female breast: Secondary | ICD-10-CM

## 2019-06-26 DIAGNOSIS — C210 Malignant neoplasm of anus, unspecified: Secondary | ICD-10-CM

## 2019-06-26 DIAGNOSIS — Z85118 Personal history of other malignant neoplasm of bronchus and lung: Secondary | ICD-10-CM | POA: Diagnosis not present

## 2019-06-26 DIAGNOSIS — Z170 Estrogen receptor positive status [ER+]: Secondary | ICD-10-CM

## 2019-06-26 DIAGNOSIS — Z85048 Personal history of other malignant neoplasm of rectum, rectosigmoid junction, and anus: Secondary | ICD-10-CM | POA: Diagnosis not present

## 2019-06-26 DIAGNOSIS — Z853 Personal history of malignant neoplasm of breast: Secondary | ICD-10-CM | POA: Diagnosis not present

## 2019-06-26 DIAGNOSIS — C3411 Malignant neoplasm of upper lobe, right bronchus or lung: Secondary | ICD-10-CM | POA: Diagnosis not present

## 2019-06-26 DIAGNOSIS — C3431 Malignant neoplasm of lower lobe, right bronchus or lung: Secondary | ICD-10-CM | POA: Insufficient documentation

## 2019-06-26 NOTE — Progress Notes (Signed)
Radiation Oncology         [phone] ________________________________  Name: Gina Cervantes        MRN: [id number]  Date of Service: 06/26/2019 DOB: [birth year]  YO:[id number], No Pcp Per  Ladell Pier, MD     REFERRING PHYSICIAN: Ladell Pier, MD   DIAGNOSIS: The encounter diagnosis was Malignant neoplasm of bronchus of right upper lobe ([place]).   HISTORY OF PRESENT ILLNESS: Gina Cervantes is a 59 y.o. female who is known to our service with multiple cancer histories. She has a history of early stage breast cancer treated with lumpectomy, adjuvant radiotherapy, and antiestrogen therapy, as well as a history of anal cancer treated with chemoRT. She has been NED from those diseases, but was found in 2018 to have a nodule in the LUL of the lung. She underwent wedge resection of this on 07/05/16 revealing a Stage IA2, pT1bN0 NSCLC, adenosquamous histology. She has been followed with surveillance CT imaging and has had several visible nodules that have been followed. On 12/13/17, a CT revealed the RUL bilobed nodule had increased in size and was 9 x 11 mm. A PET on 01/09/18 showed an SUV of 1.8 and measured 15 x 12 mm. A second right upper lobe nodule measuring 9 x 7 mm did not have hypermetabolism. A RLL nodule measured 1.2 cm along the fissure and had an SUV of 1.8. the LLL nodule measuring 1.3 x .8 cm was also seen by not hypermetabolic. She returned for repeat imaging with CT Super D scan on 04/22/18 that showed the dominant lesion in the RUL to measure 18 x 14 x 23 mm, and another prominent RUL nodule measuring 21 x 19 x 17 mm. Other lesions were stable appearing. Bronchoscopy was performed on 05/03/18 and the specimens were negative for disease. A CT biopsy on 06/07/18 revealed adenocarcinoma consistent with lung primary. She proceeded with SBRT to the RUL nodules, one of which was the biopsied nodule. She has been followed with routine surveillance imaging and a RLL nodule has been followed,  and it has enlarged since her CT in August 2020 when it had been 2.6 x 1.2 x .9 cm, and on 06/10/19, the nodule was 2.6 x 1.4 x 1.8 cm. She had little change in the RUL nodules, and stigmata of several other nodules that will be followed in the RUl, and LLL. Given her history, her case was discussed in thoracic oncology conference. She is contacted via telemedicine to discuss options of treatment to the RLL nodule.   PREVIOUS RADIATION THERAPY: Yes    09/02/2012 through 10/10/2012: The patient was treated to the anal region and high risk region. This was carried out using a IMRT technique with daily image guidance.  The patient received a total of 50.4 gray at 1.8 gray per fraction. The patient was initially treated using daily image guidance on a standard linear accelerator, and then she was treated on tomotherapy after the first week of treatment.   2010:  Adjuvant whole breast radiation to the right breast with Dr. Beola Cord  PAST MEDICAL HISTORY:  Past Medical History:  Diagnosis Date  . Anal cancer ([place]) 08/09/2012   Squamous cell  . Anxiety    PANIC ATTACKS  . Arthritis   . Breast cancer ([place]) 2009   ER+PR+HER-2+  . Ductal carcinoma ([place]) 02/2008    invasive   . Heart palpitations   . History of radiation therapy 09/02/12-10/10/12   anal 50.4Gy  . History of  shingles 10/2014   . HPV (human papilloma virus) anogenital infection    vulvar/ freezing hx  . Hx of radiation therapy 2020  . Lung nodule   . Malignant neoplasm of upper lobe of left lung ([place]) 06/2016  . Personal history of chemotherapy   . Personal history of radiation therapy 2010  . Pneumonia    NO RECENT PROBLEMS  . PONV (postoperative nausea and vomiting)    Hypotension  . Radiation 10/21/08-12/08/08   Right breast 6240 cGy  . Shingles 2017  . Status post chemotherapy 02/2008   Taxol/Herceptin       PAST SURGICAL HISTORY: Past Surgical History:  Procedure Laterality Date  . BREAST BIOPSY Right 02/04/2008    malignant  . BREAST LUMPECTOMY Right 2010   malignant  . BREAST LUMPECTOMY WITH AXILLARY LYMPH NODE BIOPSY Right 5/10   Dr. Marlou Starks  . CESAREAN SECTION    . COLONOSCOPY W/ POLYPECTOMY    . EVALUATION UNDER ANESTHESIA WITH ANAL FISTULECTOMY N/A 08/09/2012   Procedure: EXAM UNDER ANESTHESIA AND BIOPSY OF ANAL MASS;  Surgeon: Odis Hollingshead, MD;  Location: [facility];  Service: General;  Laterality: N/A;  . KNEE ARTHROSCOPY Left    left knee  . PORTACATH PLACEMENT  03/2008   dr. Marlou Starks   . REMOVAL PORTACATH  2011  . VIDEO ASSISTED THORACOSCOPY (VATS)/WEDGE RESECTION Left 07/05/2016   Procedure: VIDEO ASSISTED THORACOSCOPY (VATS)/WEDGE RESECTION left upper lobe,  lymph node dissection and placement of OnQ catheter;  Surgeon: [name], MD;  Location: [facility];  Service: Thoracic;  Laterality: Left;  Marland Kitchen VIDEO BRONCHOSCOPY N/A 07/05/2016   Procedure: VIDEO BRONCHOSCOPY;  Surgeon: [name], MD;  Location: [facility];  Service: Thoracic;  Laterality: N/A;  . VIDEO BRONCHOSCOPY N/A 05/03/2018   Procedure: VIDEO BRONCHOSCOPY;  Surgeon: [name], MD;  Location: [facility];  Service: Thoracic;  Laterality: N/A;  . VIDEO BRONCHOSCOPY WITH ENDOBRONCHIAL NAVIGATION N/A 05/03/2018   Procedure: VIDEO BRONCHOSCOPY WITH ENDOBRONCHIAL NAVIGATION WITH BIOPSY;  Surgeon: [name], MD;  Location: [facility];  Service: Thoracic;  Laterality: N/A;  . VIDEO BRONCHOSCOPY WITH ENDOBRONCHIAL ULTRASOUND N/A 05/03/2018   Procedure: VIDEO BRONCHOSCOPY WITH ENDOBRONCHIAL ULTRASOUND;  Surgeon: [name], MD;  Location: [facility];  Service: Thoracic;  Laterality: N/A;     FAMILY HISTORY:  Family History  Problem Relation Age of Onset  . Lung cancer Father   . Stroke Mother   . Breast cancer Neg Hx      SOCIAL HISTORY:  reports that she quit smoking about 6 years ago. Her smoking use included cigarettes. She quit after 8.00 years of use. She has never used smokeless tobacco. She reports current alcohol  use of about 5.0 - 20.0 standard drinks of alcohol per week. She reports that she does not use drugs. The patient is married and lives in [place]. She is a [employer].   ALLERGIES: No known allergies   MEDICATIONS:  Current Outpatient Medications  Medication Sig Dispense Refill  . albuterol (VENTOLIN HFA) 108 (90 Base) MCG/ACT inhaler Inhale 2 puffs into the lungs every 6 (six) hours as needed for wheezing or shortness of breath. 18 g 2  . CALCIUM PO Take 1 tablet by mouth daily.    Marland Kitchen loratadine (CLARITIN) 10 MG tablet Take 10 mg by mouth as needed for allergies.    . Multiple Vitamins-Minerals (MULTIVITAMIN WITH MINERALS) tablet Take 1 tablet by mouth daily.     No current facility-administered medications for this  encounter.     REVIEW OF SYSTEMS: On review of systems, the patient reports that she is doing well overall. She reports she's used a rescue inhaler a few more times lately than she usually would. That being said she denies routinely feeling short of breath, or with any cough. She continues to have hip pains that have been attributed to postradiotherapy myalgais. She denies any chest pain,  fevers, chills, night sweats, unintended weight changes. She has had intentional weight loss however. She denies any bowel or bladder disturbances, and denies abdominal pain, nausea or vomiting. She denies any new musculoskeletal or joint aches or pains, new skin lesions or concerns. A complete review of systems is obtained and is otherwise negative.     PHYSICAL EXAM:  Unable to assess vitals due to encounter type.   In general this is a well appearing caucasian female in no acute distress. She's alert and oriented x4 and appropriate throughout the examination. Cardiopulmonary assessment is negative for acute distress and she exhibits normal effort.     ECOG = 0  0 - Asymptomatic (Fully active, able to carry on all predisease activities without restriction)  1 - Symptomatic but  completely ambulatory (Restricted in physically strenuous activity but ambulatory and able to carry out work of a light or sedentary nature. For example, light housework, office work)  2 - Symptomatic, <50% in bed during the day (Ambulatory and capable of all self care but unable to carry out any work activities. Up and about more than 50% of waking hours)  3 - Symptomatic, >50% in bed, but not bedbound (Capable of only limited self-care, confined to bed or chair 50% or more of waking hours)  4 - Bedbound (Completely disabled. Cannot carry on any self-care. Totally confined to bed or chair)  5 - Death   Eustace Pen MM, Creech RH, Tormey DC, et al. [phone]). "Toxicity and response criteria of the [facility]". [place] Oncol. 5 (6): 649-55    LABORATORY DATA:  Lab Results  Component Value Date   WBC 5.2 06/28/2018   HGB 15.5 (H) 06/28/2018   HCT 45.8 06/28/2018   MCV 103.6 (H) 06/28/2018   PLT 213 06/28/2018   Lab Results  Component Value Date   NA 140 05/03/2018   K 4.1 05/03/2018   CL 97 (L) 05/03/2018   CO2 31 05/03/2018   Lab Results  Component Value Date   ALT 18 05/03/2018   AST 25 05/03/2018   ALKPHOS 59 05/03/2018   BILITOT 1.2 05/03/2018      RADIOGRAPHY: CT CHEST WO CONTRAST  Result Date: 06/10/2019 CLINICAL DATA:  Restaging lung cancer. History of left upper lobe neoplasm post pulmonary resection and chemo radiotherapy. Patient also with history of breast and anal cancer EXAM: CT CHEST WITHOUT CONTRAST TECHNIQUE: Multidetector CT imaging of the chest was performed following the standard protocol without IV contrast. COMPARISON:  12/25/2018 FINDINGS: Cardiovascular: Scattered calcified atherosclerotic change of the thoracic aorta. Vascular structures not well assessed given lack of intravenous contrast. Main pulmonary artery dilated to 3.5 cm indicative of pulmonary arterial hypertension. No pericardial effusion. Heart size is stable and normal.  Mediastinum/Nodes: Right paratracheal lymph node measures 9 mm. Scattered small thoracic inlet lymph nodes none with pathologic enlargement. Right hilar lymph node measuring 14 mm short axis previously 12 mm. Subcarinal nodal tissue is stable. Esophagus is normal. No axillary lymphadenopathy. Lungs/Pleura: Enlarging right lower lobe pulmonary nodule along the fissure measures 2.6 x 1.4 x 1.8  cm as compared to 2.6 x 1.2 by 0.9 cm (image 81, series 3) (Image 59, series 3) 10 mm pulmonary nodule in the right upper lobe along the superior margin of the major fissure is unchanged, within 1 mm of previous measurement Nodularity and thickening along the periphery of the right upper lobe show some surrounding ground-glass attenuation and septal thickening appearing smaller than on the previous exam measuring approximately 11 mm by 13 mm as compared to 15 x 13 mm but associated with pleural thickening along its deep margin it measures approximately 6 mm greatest thickness. Scattered small nodules are unchanged with the exception of a new nodule in the anterior right upper lobe (image 38, series 3) signs of subpleural reticulation along the anterior right chest are unchanged. Postoperative changes in the left upper lobe are similar to the previous study. Patchy areas of ground-glass opacity and branching nodularity in the left upper lobe with associated ground-glass features, largest area 1.2 cm unchanged. Ground-glass opacity in the left lung base, subsolid nodule (image 91, series 3) 1.4 cm previously 1.2 cm greatest axial dimension No signs of pleural effusion. Signs of centrilobular and paraseptal emphysema as before. Upper Abdomen: Incidental imaging of upper abdominal contents is unremarkable. Adrenal glands are normal. Musculoskeletal: No acute bone finding or destructive bone process IMPRESSION: 1. Enlarging right lower lobe pulmonary nodule along the fissure measuring 2.6 x 1.4 x 1.8 cm as compared to 2.6 x 1.2 x 0.9  cm. Findings are concerning for worsening of disease. 2. Little change with respect to right upper lobe nodule perhaps slightly smaller but associated with underlying pleural thickening and surrounding ground-glass attenuation likely related to prior radiation. 3. Presumed stigmata of multifocal adenocarcinoma elsewhere in the chest with new nodule in the right upper lobe and enlarging sub solid nodule in the left lung base. 4. Postoperative changes in the left upper lobe as before. 5.  mediastinal and right hilar lymph nodes. 6. Dilated main pulmonary artery indicative of pulmonary arterial hypertension. Aortic Atherosclerosis (ICD10-I70.0) and Emphysema (ICD10-J43.9). Electronically Signed   By: Zetta Bills M.D.   On: 06/10/2019 14:34       IMPRESSION/PLAN: 1. History of Stage IA, pT1bN0M0 NSCLC, adenosquamous carcinoma of the LUL, Stage IA2, cT1bN0M0 adenocarcinoma of the RUL and new features in the RLL suggesting multifocal NSCLC most likely adenocarcinoma. Dr. Lisbeth Renshaw discusses the imaging findings and reviews the nature of multifocal adenocarcinoma of the lung. She is aware of the risks of persistent or additional primaries that could occur in the future. The mainstay of therapy would be to proceed with local therapy. She is not interested in further biopsy or surgery due to her previous experiences. With her history Dr. Lisbeth Renshaw discusses that a course of sterotactic body radiotherapy (SBRT) as she has previously received in the RUL would be an option with the progressive changes in the RLL we are currently seeing by imaging. He recommends a course of 3-5 fractions to the RLL of SBRT. We discussed the risks, benefits, short, and long term effects of radiotherapy, and the patient is interested in proceeding. Dr. Lisbeth Renshaw discusses the delivery and logistics of radiotherapy and she is scheduled to come in tomorrow for simulation. At that time she will sign written consent to proceed.  2. Remote history of  estrogen positive breast cancer and history of anal cancer. She continues with Dr. Benay Spice in medical oncology for her care and sees GYN annually as well.   This encounter was provided by telemedicine platform [employer]  after a failed attempt at [place].  The patient has provided two factor identification and has given verbal consent for this type of encounter and has been advised to only accept a meeting of this type in a secure network environment. The time spent during this encounter was 45 minutes including preparation, discussion, and coordination of the patient's care. The attendants for this meeting include  Dr. Lisbeth Renshaw, Hayden Pedro  and Erenest Rasher.  During the encounter, Dr. Lisbeth Renshaw, was located at [place], Shona Simpson was located at home remotely.  Susann Givens Bou was located at home.     The above documentation reflects my direct findings during this shared patient visit. Please see the separate note by Dr. Lisbeth Renshaw on this date for the remainder of the patient's plan of care.    Carola Rhine, PAC

## 2019-06-27 ENCOUNTER — Ambulatory Visit
Admission: RE | Admit: 2019-06-27 | Discharge: 2019-06-27 | Disposition: A | Discharge status: Home / Self Care | Payer: BC Managed Care – PPO | Source: Ambulatory Visit | Attending: Radiation Oncology | Admitting: Radiation Oncology

## 2019-06-27 ENCOUNTER — Other Ambulatory Visit: Payer: Self-pay

## 2019-06-27 DIAGNOSIS — C3411 Malignant neoplasm of upper lobe, right bronchus or lung: Secondary | ICD-10-CM | POA: Diagnosis not present

## 2019-06-27 DIAGNOSIS — C3431 Malignant neoplasm of lower lobe, right bronchus or lung: Secondary | ICD-10-CM

## 2019-06-27 DIAGNOSIS — Z510 Encounter for antineoplastic radiation therapy: Secondary | ICD-10-CM | POA: Insufficient documentation

## 2019-07-04 DIAGNOSIS — Z510 Encounter for antineoplastic radiation therapy: Secondary | ICD-10-CM | POA: Diagnosis not present

## 2019-07-04 DIAGNOSIS — C3411 Malignant neoplasm of upper lobe, right bronchus or lung: Secondary | ICD-10-CM | POA: Diagnosis not present

## 2019-07-08 ENCOUNTER — Other Ambulatory Visit: Payer: Self-pay

## 2019-07-08 ENCOUNTER — Ambulatory Visit
Admission: RE | Admit: 2019-07-08 | Discharge: 2019-07-08 | Disposition: A | Discharge status: Home / Self Care | Payer: BC Managed Care – PPO | Source: Ambulatory Visit | Attending: Radiation Oncology | Admitting: Radiation Oncology

## 2019-07-08 DIAGNOSIS — C3411 Malignant neoplasm of upper lobe, right bronchus or lung: Secondary | ICD-10-CM | POA: Diagnosis not present

## 2019-07-08 DIAGNOSIS — Z510 Encounter for antineoplastic radiation therapy: Secondary | ICD-10-CM | POA: Insufficient documentation

## 2019-07-10 ENCOUNTER — Other Ambulatory Visit: Payer: Self-pay

## 2019-07-10 ENCOUNTER — Ambulatory Visit
Admission: RE | Admit: 2019-07-10 | Discharge: 2019-07-10 | Disposition: A | Discharge status: Home / Self Care | Payer: BC Managed Care – PPO | Source: Ambulatory Visit | Attending: Radiation Oncology | Admitting: Radiation Oncology

## 2019-07-10 DIAGNOSIS — Z510 Encounter for antineoplastic radiation therapy: Secondary | ICD-10-CM | POA: Diagnosis not present

## 2019-07-10 DIAGNOSIS — C3411 Malignant neoplasm of upper lobe, right bronchus or lung: Secondary | ICD-10-CM | POA: Diagnosis not present

## 2019-07-14 ENCOUNTER — Encounter: Payer: Self-pay | Admitting: Radiation Oncology

## 2019-07-14 ENCOUNTER — Other Ambulatory Visit: Payer: Self-pay

## 2019-07-14 ENCOUNTER — Ambulatory Visit
Admission: RE | Admit: 2019-07-14 | Discharge: 2019-07-14 | Disposition: A | Discharge status: Home / Self Care | Payer: BC Managed Care – PPO | Source: Ambulatory Visit | Attending: Radiation Oncology | Admitting: Radiation Oncology

## 2019-07-14 DIAGNOSIS — C3411 Malignant neoplasm of upper lobe, right bronchus or lung: Secondary | ICD-10-CM | POA: Diagnosis not present

## 2019-07-14 DIAGNOSIS — Z510 Encounter for antineoplastic radiation therapy: Secondary | ICD-10-CM | POA: Diagnosis not present

## 2019-07-15 ENCOUNTER — Ambulatory Visit: Payer: BC Managed Care – PPO | Admitting: Radiation Oncology

## 2019-07-16 ENCOUNTER — Ambulatory Visit: Payer: BC Managed Care – PPO | Admitting: Radiation Oncology

## 2019-07-16 NOTE — Progress Notes (Signed)
  Radiation Oncology         [phone]) [phone] ________________________________  Name: Gina Cervantes MRN: [id number]  Date: 06/27/2019  DOB: 1960/06/10  RESPIRATORY MOTION MANAGEMENT SIMULATION  NARRATIVE:  In order to account for effect of respiratory motion on target structures and other organs in the planning and delivery of radiotherapy, this patient underwent respiratory motion management simulation.  To accomplish this, when the patient was brought to the CT simulation planning suite, 4D respiratoy motion management CT images were obtained.  The CT images were loaded into the planning software.  Then, using a variety of tools including Cine, MIP, and standard views, the target volume and planning target volumes (PTV) were delineated.  Avoidance structures were contoured.  Treatment planning then occurred.  Dose volume histograms were generated and reviewed for each of the requested structure.  The resulting plan was carefully reviewed and approved today.   ------------------------------------------------  Jodelle Gross, MD, PhD

## 2019-07-16 NOTE — Progress Notes (Signed)
[facility] Radiation Oncology Simulation and Treatment Planning Note   Name:  Gina Cervantes MRN: [id number]   Date: 06/27/2019  DOB: 1961/04/02  Status:outpatient    DIAGNOSIS:    ICD-10-CM   1. Malignant neoplasm of lower lobe of right lung ([place])  C34.31      CONSENT VERIFIED:yes   SET UP: Patient is setup supine   IMMOBILIZATION: The patient was immobilized using a customized Vac Loc bag/ blue bag and customized accuform device   NARRATIVE:The patient was brought to the [facility].  Identity was confirmed.  All relevant records and images related to the planned course of therapy were reviewed.  Then, the patient was positioned in a stable reproducible clinical set-up for radiation therapy. Abdominal compression was applied by me.  4D CT images were obtained and reproducible breathing pattern was confirmed. Free breathing CT images were obtained.  Skin markings were placed.  The CT images were loaded into the planning software where the target and avoidance structures were contoured.  The radiation prescription was entered and confirmed.    TREATMENT PLANNING NOTE:  Treatment planning then occurred. I have requested : IMRT planning.This treatment technique is medically necessary due to the high-dose of radiation delivered to the target region which is in close proximity to adjacent critical normal structures.  3 dimensional simulation is performed and dose volume histogram of the gross tumor volume, planning tumor volume and criticial normal structures including the spinal cord and lungs were analyzed and requested.  Special treatment procedure was performed due to high dose per fraction.  The patient will be monitored for increased risk of toxicity.  Daily imaging using cone beam CT will be used for target localization.  I anticipate that the patient will receive 54 Gy in 3 fractions to target volume. Further adjustments will be made based on  the planning process is necessary.  ------------------------------------------------  Jodelle Gross, MD, PhD

## 2019-07-17 ENCOUNTER — Ambulatory Visit: Payer: BC Managed Care – PPO | Admitting: Radiation Oncology

## 2019-07-18 ENCOUNTER — Ambulatory Visit: Payer: BC Managed Care – PPO | Admitting: Radiation Oncology

## 2019-08-06 ENCOUNTER — Telehealth: Payer: Self-pay | Admitting: Radiation Oncology

## 2019-08-06 DIAGNOSIS — C3431 Malignant neoplasm of lower lobe, right bronchus or lung: Secondary | ICD-10-CM

## 2019-08-06 NOTE — Telephone Encounter (Signed)
  Radiation Oncology         (336) [phone] ________________________________  Name: Gina Cervantes MRN: [id number]  Date of Service: 08/06/2019  DOB: Jul 25, 1960  Post Treatment Telephone Note  Diagnosis:    History of Stage IA, pT1bN0M0 NSCLC, adenosquamous carcinoma of the LUL, Stage IA2, cT1bN0M0 adenocarcinoma of the RUL and new features in the RLL suggesting multifocal NSCLC most likely adenocarcinoma.  Interval Since Last Radiation:  3 weeks   07/08/19-07/14/19 SBRT Treatment: The right lung target in the RLL was treated to 54 Gy in 3 fractions.  07/23/2018-07/30/2018 SBRT Treatment: The dominant tumor in the RUL lung was treated to 54 Gy in 3 fractions.  09/02/2012 through 10/10/2012: The patient was treated to the anal region and high risk region. This was carried out using a IMRT technique with daily image guidance. The patient received a total of 50.4 gray at 1.8 gray per fraction. The patient was initially treated using daily image guidance on a standard linear accelerator, and then she was treated on tomotherapy after the first week of treatment.   2010:  Adjuvant whole breast radiation to the right breast with Dr. Beola Cord  Narrative:  The patient was contacted today for routine follow-up. During treatment she did very well with radiotherapy and did not have significant desquamation. She reports she is doing great. She gets her second covid vaccine next Monday and two weeks later is planning to go out to [place] to see her son who she hasn't seen for a long time due to covid.   Impression/Plan: 1.  History of Stage IA, pT1bN0M0 NSCLC, adenosquamous carcinoma of the LUL, Stage IA2, cT1bN0M0 adenocarcinoma of the RUL and new features in the RLL suggesting multifocal NSCLC most likely adenocarcinoma. The patient has been doing well since completion of radiotherapy. We discussed that we would be happy to continue to follow her as needed, but she will also continue to follow up with Dr.  Benay Spice in medical oncology. Since it will be August when she sees Dr. Learta Codding back, I'll plan to order a new baseline CT following treatment in late May or early June 2021. She is in agreement with this plan.    Carola Rhine, PAC

## 2019-09-01 ENCOUNTER — Other Ambulatory Visit: Payer: BC Managed Care – PPO

## 2019-09-08 ENCOUNTER — Other Ambulatory Visit: Payer: Self-pay | Admitting: Oncology

## 2019-09-16 ENCOUNTER — Ambulatory Visit: Payer: Self-pay | Admitting: Radiation Oncology

## 2019-09-16 ENCOUNTER — Ambulatory Visit
Admission: RE | Admit: 2019-09-16 | Discharge: 2019-09-16 | Disposition: A | Discharge status: Home / Self Care | Payer: BC Managed Care – PPO | Source: Ambulatory Visit | Attending: Radiation Oncology | Admitting: Radiation Oncology

## 2019-09-16 DIAGNOSIS — C3431 Malignant neoplasm of lower lobe, right bronchus or lung: Secondary | ICD-10-CM

## 2019-09-16 DIAGNOSIS — C3490 Malignant neoplasm of unspecified part of unspecified bronchus or lung: Secondary | ICD-10-CM | POA: Diagnosis not present

## 2019-09-16 MED ORDER — IOPAMIDOL (ISOVUE-300) INJECTION 61%
75.0000 mL | Freq: Once | INTRAVENOUS | Status: AC | PRN
  Administered 2019-09-16: 75 mL via INTRAVENOUS

## 2019-09-18 ENCOUNTER — Telehealth: Payer: Self-pay | Admitting: Radiation Oncology

## 2019-09-18 NOTE — Telephone Encounter (Signed)
I called the patient to let her know her CT scan results, but had to leave a message for her to call us back at her convenience. I will also look out for her next scan in August after she sees Dr. Benay Spice.

## 2019-09-18 NOTE — Progress Notes (Signed)
  Radiation Oncology         [phone] ________________________________  Name: Gina Cervantes MRN: [id number]  Date: 07/14/2019  DOB: 19-Jan-1961  End of Treatment Note  Diagnosis:   Non-small cell lung cancer     Indication for treatment:  Curative       Radiation treatment dates:   07/08/19 - 07/14/19  Site/dose:   The tumor in the right lung was treated with a course of stereotactic body radiation treatment. The patient received 54 Gy In 3 fractions at 18 G per fraction.  Narrative: The patient tolerated radiation treatment relatively well.   The patient did not have any signs of acute toxicity during treatment.  Plan: The patient has completed radiation treatment. The patient will return to radiation oncology clinic for routine followup in one month. I advised the patient to call or return sooner if they have any questions or concerns related to their recovery or treatment.   ------------------------------------------------  Jodelle Gross, MD, PhD

## 2019-09-18 NOTE — Telephone Encounter (Signed)
The patient called back and we were able to review her CT and plans to review her next in August to follow the area in the RUL that has not been treated that was described as ground glass with more solid component.

## 2019-09-29 ENCOUNTER — Ambulatory Visit: Payer: BC Managed Care – PPO | Admitting: Radiation Oncology

## 2019-11-18 ENCOUNTER — Telehealth: Payer: Self-pay

## 2019-11-18 ENCOUNTER — Encounter: Payer: Self-pay | Admitting: Oncology

## 2019-11-18 ENCOUNTER — Other Ambulatory Visit: Payer: Self-pay

## 2019-11-18 NOTE — Telephone Encounter (Signed)
Pt spoke with this nurse she expressed concerns that previous results were received in my chart but she needed to call in for explanation of results this nurse states every attempt will be made to ensure someone give a call ball in a timely fashion pt very appreciative of call nothing further

## 2019-11-18 NOTE — Telephone Encounter (Signed)
Spoke with resident concerning upcoming appt CT scan scheduled for 11/26/19/at 230

## 2019-11-25 ENCOUNTER — Other Ambulatory Visit: Payer: Self-pay | Admitting: Oncology

## 2019-11-25 DIAGNOSIS — Z1231 Encounter for screening mammogram for malignant neoplasm of breast: Secondary | ICD-10-CM

## 2019-11-26 ENCOUNTER — Encounter: Payer: Self-pay | Admitting: Oncology

## 2019-11-26 ENCOUNTER — Encounter: Payer: Self-pay | Admitting: *Deleted

## 2019-11-26 ENCOUNTER — Ambulatory Visit (HOSPITAL_COMMUNITY)
Admission: RE | Admit: 2019-11-26 | Discharge: 2019-11-26 | Disposition: A | Discharge status: Home / Self Care | Payer: BC Managed Care – PPO | Source: Ambulatory Visit | Attending: Oncology | Admitting: Oncology

## 2019-11-26 ENCOUNTER — Other Ambulatory Visit: Payer: Self-pay

## 2019-11-26 ENCOUNTER — Encounter (HOSPITAL_COMMUNITY): Payer: Self-pay

## 2019-11-26 DIAGNOSIS — C3412 Malignant neoplasm of upper lobe, left bronchus or lung: Secondary | ICD-10-CM

## 2019-11-27 ENCOUNTER — Other Ambulatory Visit: Payer: Self-pay | Admitting: *Deleted

## 2019-11-27 DIAGNOSIS — C3490 Malignant neoplasm of unspecified part of unspecified bronchus or lung: Secondary | ICD-10-CM

## 2019-11-27 NOTE — Progress Notes (Signed)
New CT order placed and patient notified.

## 2019-12-05 ENCOUNTER — Ambulatory Visit
Admission: RE | Admit: 2019-12-05 | Discharge: 2019-12-05 | Disposition: A | Discharge status: Home / Self Care | Payer: BC Managed Care – PPO | Source: Ambulatory Visit | Attending: Oncology | Admitting: Oncology

## 2019-12-05 DIAGNOSIS — C3490 Malignant neoplasm of unspecified part of unspecified bronchus or lung: Secondary | ICD-10-CM | POA: Diagnosis not present

## 2019-12-09 ENCOUNTER — Other Ambulatory Visit: Payer: Self-pay

## 2019-12-09 ENCOUNTER — Inpatient Hospital Stay: Payer: BC Managed Care – PPO | Attending: Oncology | Admitting: Oncology

## 2019-12-09 VITALS — BP 129/72 | HR 100 | Temp 97.7°F | Resp 16 | Ht 64.25 in | Wt 144.8 lb

## 2019-12-09 DIAGNOSIS — Z9221 Personal history of antineoplastic chemotherapy: Secondary | ICD-10-CM | POA: Insufficient documentation

## 2019-12-09 DIAGNOSIS — Z853 Personal history of malignant neoplasm of breast: Secondary | ICD-10-CM | POA: Diagnosis not present

## 2019-12-09 DIAGNOSIS — Z85048 Personal history of other malignant neoplasm of rectum, rectosigmoid junction, and anus: Secondary | ICD-10-CM | POA: Diagnosis not present

## 2019-12-09 DIAGNOSIS — Z23 Encounter for immunization: Secondary | ICD-10-CM | POA: Insufficient documentation

## 2019-12-09 DIAGNOSIS — C3490 Malignant neoplasm of unspecified part of unspecified bronchus or lung: Secondary | ICD-10-CM | POA: Diagnosis not present

## 2019-12-09 DIAGNOSIS — Z923 Personal history of irradiation: Secondary | ICD-10-CM | POA: Insufficient documentation

## 2019-12-09 DIAGNOSIS — C3411 Malignant neoplasm of upper lobe, right bronchus or lung: Secondary | ICD-10-CM | POA: Diagnosis not present

## 2019-12-09 DIAGNOSIS — R918 Other nonspecific abnormal finding of lung field: Secondary | ICD-10-CM | POA: Insufficient documentation

## 2019-12-09 MED ORDER — ALBUTEROL SULFATE HFA 108 (90 BASE) MCG/ACT IN AERS: INHALATION_SPRAY | RESPIRATORY_TRACT | 2 refills | Status: DC

## 2019-12-09 NOTE — Progress Notes (Signed)
[place] OFFICE PROGRESS NOTE   Diagnosis: Non-small cell lung cancer, anal cancer, breast cancer  INTERVAL HISTORY:   Gina Cervantes returns as scheduled.  She feels well.  Good appetite.  She completed SBRT to a right lung nodule in March.  She has increased exertional dyspnea during the hot weather.  The inhaler helps. She is scheduled for a GYN evaluation within the next week.  She is scheduled for mammogram next month.  Objective:  Vital signs in last 24 hours:  Blood pressure 129/72, pulse 100, temperature 97.7 F (36.5 C), temperature source Temporal, resp. rate 16, height 5' 4.25" (1.632 m), weight 144 lb 12.8 oz (65.7 kg), last menstrual period 04/07/2008, SpO2 91 %.    Lymphatics: No cervical or supraclavicular nodes Resp: Scattered end inspiratory rhonchi/wheeze, no respiratory distress Cardio: Regular rate and rhythm  Vascular: No leg edema   Lab Results:  Lab Results  Component Value Date   WBC 5.2 06/28/2018   HGB 15.5 (H) 06/28/2018   HCT 45.8 06/28/2018   MCV 103.6 (H) 06/28/2018   PLT 213 06/28/2018   NEUTROABS 3.3 06/28/2018    CMP  Lab Results  Component Value Date   NA 140 05/03/2018   K 4.1 05/03/2018   CL 97 (L) 05/03/2018   CO2 31 05/03/2018   GLUCOSE 111 (H) 05/03/2018   BUN 9 05/03/2018   CREATININE 0.62 05/03/2018   CALCIUM 9.2 05/03/2018   PROT 7.2 05/03/2018   ALBUMIN 3.8 05/03/2018   AST 25 05/03/2018   ALT 18 05/03/2018   ALKPHOS 59 05/03/2018   BILITOT 1.2 05/03/2018   GFRNONAA >60 05/03/2018   GFRAA >60 05/03/2018     Imaging:  CT images 12/05/2019-reviewed with Ms. Grzelak Medications: I have reviewed the patient's current medications.   Assessment/Plan: 1.Anal cancer-left anal margin/anal canal mass, status post a biopsy on 08/09/2012 confirming invasive moderately differentiated squamous cell carcinoma,p16 positive.  -Staging PET scan 08/20/2012-hypermetabolic anal mass, no hypermetabolic metastases   -Initiation of concurrent radiation with cycle 1 of mitomycin C./5-fluorouracil 09/02/2012.  -She completed cycle 2 5-fluorouracil/mitomycin C. beginning 10/01/2012.  -She completed radiation 10/10/2012.  2. Right breast cancer 2009, ER positive, PR positive, HER-2 positive. She is status post neoadjuvant AC x4 cycles followed by Taxol/Herceptin. She underwent a right lumpectomy and sentinel lymph node biopsy 09/22/2008 with no evidence of residual invasive carcinoma and 5 negative sentinel lymph nodes. She then completed right breast radiation. She began tamoxifen 12/15/2008 and completed adjuvant Herceptin 05/25/2009. The tamoxifen was discontinued and she was switched to Arimidex in July 2013. Arimidex discontinued in mid July 2019. 3. History of enlargement of the right tonsil.  4. Nonspecific pulmonary nodules without hypermetabolic activity on the PET scan 08/20/2012. Chest CT 10/21/2012 with scattered pulmonary nodules including nodules that were not present in 2010. Annual surveillance was recommended. Follow-up chest CT 10/24/2013 with scattered groundglass densities again noted in both lungs mostly unchanged when compared to the previous study. A sub-solid right lower lobe nodule had increased central density. The size was unchanged.The nodule has been present since 2009.  CT chest 06/13/2016-a sub-solid 9 mm right upper lobe nodule has enlarged compared to 2015, a right lower lobe irregular nodular density appears more well defined, new vague 5 mm groundglass left upper lobe nodule, left lower lobe 10 mm groundglass nodule is more apparent, posterior left upper lobe circumscribed solid nodule measures 11 x 10 mm and is new  PET scan 06/22/2016-malignant range activity involving the 11 mm  left upper lobe nodule, no other hypermetabolic nodules, suspicious sub-solid right lower lobe nodule  Wedge resection of a hypermetabolic left upper lobe nodule on 07/05/2016-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 No EGFR, ALK, BRAF, RET, ERBB2, or ROS1 alteration  CT chest 11/14/2016-status post left upper lobe wedge resection, stable right lung nodules  Chest CT 05/02/2017-stable bilateral solid and groundglass nodules, stable mild mediastinal lymphadenopathy  CT chest 12/13/2017- enlargement of right upper lobe nodule, other lung nodules and chest lymph node stable  PET 01/09/2018- right upper lobe nodule and right lower lobe nodule have enlarged since 2018 and have low level metabolic activity increased size of an 8 mm right upper lobe nodule without increased metabolic activity, stable activity and a mildly enlarged lower paratracheal node, no evidence of metastatic disease in the abdomen or pelvis  CT chest 04/22/2018- no new lung nodules, dominant right lung nodules slightly increased in size  Bronchoscopy/EBUS 05/04/2019 with biopsy of level 7 node, biopsy of right lower lobe nodule, FNA of right lower lobe nodule-negative  CT biopsy of right upper lobe nodule 06/07/2018- adenocarcinoma with lepidic and acinar patterns  SBRT to the dominant right upper lobe nodule 07/23/2018-07/30/2018  CT 09/16/2018-slight increase in size of medial right upper lobe nodule-possibly secondary to interval radiation, other nodules are unchanged  CT 12/25/2018- medial right upper lobe nodule has decreased in size slightly, multiple additional solid and groundglass nodules are stable  CT 06/09/2019-enlarging right lower lobe nodule, enlarging subsolid nodule, new right upper lobe nodule  SBRT to right lower lobe lung nodule, 3 fractions 07/08/2019-07/14/2019  CT 12/05/2019-dominant right lower lobe lesion stable to slightly decreased in size, progressive solid component associated with a dominant right upper lung nodule, mild progression of a left upper lobe nodule, new subsolid anterior  medial left upper lobe nodule  Disposition: Ms. Paladino appears unchanged.  We reviewed the restaging chest CT findings and images.  She has multifocal bilateral lung nodules.  The CT is essentially unchanged other than a new subsolid left upper lobe lesion.  Her case will be presented at the thoracic tumor conference on 12/18/2019.  The plan is to continue observation for now.  We discussed systemic treatment options including first-line treatment with Alimta/carboplatin/pembrolizumab and second line treatment with a targeted agent for the [place] G12C alteration.  She will be scheduled for an office visit and restaging CT in approximately 4 months.  She will continue the Ventolin inhaler as needed for dyspnea.  She plans to schedule an appointment with Dr. Kenton Kingfisher to discuss treatment for probable underlying COPD.  Betsy Coder, MD  12/09/2019  11:55 AM

## 2019-12-09 NOTE — Progress Notes (Addendum)
59 y.o. [id number] Married White or Caucasian female here for annual exam.  Feels well.  Just saw Dr. Benay Spice yesterday.  Has 1.5cm well differentiaed adenosquamous carcinoma, 1.5cm.  Was treated with 3 radiation treatments.  Had CT scan and everything was stable.  Will be presented at tumor conference on 8/12.  They did have some discussion about chemotherapy options.    Denies vaginal bleeding.  Denies rectal bleeding.  Would like ativan refill.  Hasn't had one since 2017.  Patient's last menstrual period was 04/07/2008.          Sexually active: Yes.    The current method of family planning is post menopausal status.    Exercising: Yes.    walking Smoker:  no  Health Maintenance: Pap:   12/02/18 Neg:Pos HR HPV  07/19/17 Neg:Pos HR HPV:#16, 18/45 neg              04/07/16 neg History of abnormal Pap:  yes MMG:  01/08/19 BIRADS 1 negative/density b Colonoscopy:  12/29/15 polyp. Dr. Collene Mares f/u 5 years BMD:   12/30/14 Normal TDaP:  08/2008 Pneumonia vaccine(s):  n/a Shingrix:   never Hep C testing: 04/07/16 neg Screening Labs: PCP   reports that she quit smoking about 6 years ago. Her smoking use included cigarettes. She quit after 8.00 years of use. She has never used smokeless tobacco. She reports current alcohol use of about 5.0 - 20.0 standard drinks of alcohol per week. She reports that she does not use drugs.  Past Medical History:  Diagnosis Date  . Anal cancer ([place]) 08/09/2012   Squamous cell  . Anxiety    PANIC ATTACKS  . Arthritis   . Breast cancer ([place]) 2009   ER+PR+HER-2+  . Ductal carcinoma ([place]) 02/2008    invasive   . Heart palpitations   . History of radiation therapy 09/02/12-10/10/12   anal 50.4Gy  . History of shingles 10/2014   . HPV (human papilloma virus) anogenital infection    vulvar/ freezing hx  . Hx of radiation therapy 2020  . Lung nodule   . Malignant neoplasm of upper lobe of left lung ([place]) 06/2016  . Personal history of chemotherapy   . Personal history of  radiation therapy 2010  . Pneumonia    NO RECENT PROBLEMS  . PONV (postoperative nausea and vomiting)    Hypotension  . Radiation 10/21/08-12/08/08   Right breast 6240 cGy  . Shingles 2017  . Status post chemotherapy 02/2008   Taxol/Herceptin    Past Surgical History:  Procedure Laterality Date  . BREAST BIOPSY Right 02/04/2008   malignant  . BREAST LUMPECTOMY Right 2010   malignant  . BREAST LUMPECTOMY WITH AXILLARY LYMPH NODE BIOPSY Right 5/10   Dr. Marlou Starks  . CESAREAN SECTION    . COLONOSCOPY W/ POLYPECTOMY    . EVALUATION UNDER ANESTHESIA WITH ANAL FISTULECTOMY N/A 08/09/2012   Procedure: EXAM UNDER ANESTHESIA AND BIOPSY OF ANAL MASS;  Surgeon: Odis Hollingshead, MD;  Location: [facility];  Service: General;  Laterality: N/A;  . KNEE ARTHROSCOPY Left    left knee  . LUNG LOBECTOMY Left   . PORTACATH PLACEMENT  03/2008   dr. Marlou Starks   . REMOVAL PORTACATH  2011  . VIDEO ASSISTED THORACOSCOPY (VATS)/WEDGE RESECTION Left 07/05/2016   Procedure: VIDEO ASSISTED THORACOSCOPY (VATS)/WEDGE RESECTION left upper lobe,  lymph node dissection and placement of OnQ catheter;  Surgeon: [name], MD;  Location: [facility];  Service: Thoracic;  Laterality: Left;  .  VIDEO BRONCHOSCOPY N/A 07/05/2016   Procedure: VIDEO BRONCHOSCOPY;  Surgeon: [name], MD;  Location: [facility] OR;  Service: Thoracic;  Laterality: N/A;  . VIDEO BRONCHOSCOPY N/A 05/03/2018   Procedure: VIDEO BRONCHOSCOPY;  Surgeon: [name], MD;  Location: [facility] OR;  Service: Thoracic;  Laterality: N/A;  . VIDEO BRONCHOSCOPY WITH ENDOBRONCHIAL NAVIGATION N/A 05/03/2018   Procedure: VIDEO BRONCHOSCOPY WITH ENDOBRONCHIAL NAVIGATION WITH BIOPSY;  Surgeon: [name], MD;  Location: [place];  Service: Thoracic;  Laterality: N/A;  . VIDEO BRONCHOSCOPY WITH ENDOBRONCHIAL ULTRASOUND N/A 05/03/2018   Procedure: VIDEO BRONCHOSCOPY WITH ENDOBRONCHIAL ULTRASOUND;  Surgeon: [name], MD;  Location: [facility];  Service: Thoracic;   Laterality: N/A;    Current Outpatient Medications  Medication Sig Dispense Refill  . albuterol (VENTOLIN HFA) 108 (90 Base) MCG/ACT inhaler TAKE 2 PUFFS BY MOUTH EVERY 6 HOURS AS NEEDED FOR WHEEZE OR SHORTNESS OF BREATH 90 g 2  . CALCIUM PO Take 1 tablet by mouth daily.    Marland Kitchen loratadine (CLARITIN) 10 MG tablet Take 10 mg by mouth as needed for allergies.    . Multiple Vitamins-Minerals (MULTIVITAMIN WITH MINERALS) tablet Take 1 tablet by mouth daily.    Marland Kitchen POTASSIUM CHLORIDE PO Take by mouth every morning.     No current facility-administered medications for this visit.    Family History  Problem Relation Age of Onset  . Lung cancer Father   . Stroke Mother   . Breast cancer Neg Hx     Review of Systems  All other systems reviewed and are negative.   Exam:   BP 116/68 (BP Location: Left Arm, Patient Position: Sitting, Cuff Size: Normal)   Pulse 88   Resp 14   Ht 5' 4.5" (1.638 m)   Wt 145 lb (65.8 kg)   LMP 04/07/2008   BMI 24.50 kg/m   Height: 5' 4.5" (163.8 cm)  General appearance: alert, cooperative and appears stated age Head: Normocephalic, without obvious abnormality, atraumatic Neck: no adenopathy, supple, symmetrical, trachea midline and thyroid normal to inspection and palpation Lungs: clear to auscultation bilaterally Breasts: normal appearance, no masses or tenderness Heart: regular rate and rhythm Abdomen: soft, non-tender; bowel sounds normal; no masses,  no organomegaly Extremities: extremities normal, atraumatic, no cyanosis or edema Skin: Skin color, texture, turgor normal. No rashes or lesions Lymph nodes: Cervical, supraclavicular, and axillary nodes normal. No abnormal inguinal nodes palpated Neurologic: Grossly normal   Pelvic: External genitalia:  no lesions              Urethra:  normal appearing urethra with no masses, tenderness or lesions              Bartholins and Skenes: normal                 Vagina: atrophic changes with standard  vascular changes noted with h/o radiation (stable) with no discharge, blood or lesions              Cervix: no lesions              Pap taken: Yes.   Bimanual Exam:  Uterus:  normal size, contour, position, consistency, mobility, non-tender              Adnexa: normal adnexa and no mass, fullness, tenderness               Rectovaginal: Confirms               Anus:  normal sphincter tone, radiation skin changes present, stable  Chaperone, Terence Lux, CMA, was present for exam.  A:  Well Woman with normal exam PMP, no HRT H/o invasive ductal carcinoma of right breast 2009, sp lumpectomy/SNV, chemo/radidaiton.  Took tamoxifen x 5 years and then Arimidex fo 5 years.  Completed therapy 6/19 H/o anal cancer s/p radidaiton completed 6/14 H/o small cell lung cancer with wedge resection of left upper lung 2/18, negative margins, stage 1A then enlarging lymph nodes on CT, then radiation March 2020  P:   Mammogram guidelines reviewed.  Last MMG 01/2019 pap smear and HR HPV obtained today No blood work obtained today.  We discussed this last year.  She is not fasting.  She does not want to have any blood work done today.  I completely understand but will consider fasting lipid panel if possible this year. I believe colonoscopy is due this year.  Will reach out to Dr. Lorie Apley office to be sure BMD due.  Pt does not want to proceed with this at this time.  Doesn't want any extra x-ray.  Will consider next year. I think should consider pneumonia vaccination.  Will check with Dr. Ammie Dalton as well.  (addendum:  Dr. Benay Spice agrees and pt has been notified.  12/12/2019) We discussed expanded genetic testing as well in light of third cancer.  She did have genetic testing after initial cancer diagnosis.  Does not want to proceed with this either this year Return annually or prn

## 2019-12-10 ENCOUNTER — Encounter: Payer: Self-pay | Admitting: Obstetrics & Gynecology

## 2019-12-10 ENCOUNTER — Other Ambulatory Visit (HOSPITAL_COMMUNITY)
Admission: RE | Admit: 2019-12-10 | Discharge: 2019-12-10 | Disposition: A | Discharge status: Home / Self Care | Payer: BC Managed Care – PPO | Source: Ambulatory Visit | Attending: Obstetrics & Gynecology | Admitting: Obstetrics & Gynecology

## 2019-12-10 ENCOUNTER — Ambulatory Visit (INDEPENDENT_AMBULATORY_CARE_PROVIDER_SITE_OTHER): Payer: BC Managed Care – PPO | Admitting: Obstetrics & Gynecology

## 2019-12-10 VITALS — BP 116/68 | HR 88 | Resp 14 | Ht 64.5 in | Wt 145.0 lb

## 2019-12-10 DIAGNOSIS — D490 Neoplasm of unspecified behavior of digestive system: Secondary | ICD-10-CM | POA: Diagnosis not present

## 2019-12-10 DIAGNOSIS — Z01419 Encounter for gynecological examination (general) (routine) without abnormal findings: Secondary | ICD-10-CM

## 2019-12-10 DIAGNOSIS — Z124 Encounter for screening for malignant neoplasm of cervix: Secondary | ICD-10-CM | POA: Diagnosis not present

## 2019-12-10 DIAGNOSIS — B977 Papillomavirus as the cause of diseases classified elsewhere: Secondary | ICD-10-CM

## 2019-12-10 MED ORDER — LORAZEPAM 0.5 MG PO TABS: 0.5000 mg | ORAL_TABLET | Freq: Four times a day (QID) | ORAL | 0 refills | Status: DC | PRN

## 2019-12-12 ENCOUNTER — Encounter: Payer: Self-pay | Admitting: Obstetrics & Gynecology

## 2019-12-15 ENCOUNTER — Ambulatory Visit: Payer: BC Managed Care – PPO | Admitting: Obstetrics & Gynecology

## 2019-12-16 ENCOUNTER — Other Ambulatory Visit: Payer: Self-pay | Admitting: *Deleted

## 2019-12-16 DIAGNOSIS — B977 Papillomavirus as the cause of diseases classified elsewhere: Secondary | ICD-10-CM

## 2019-12-18 ENCOUNTER — Encounter: Payer: Self-pay | Admitting: Genetic Counselor

## 2019-12-18 ENCOUNTER — Telehealth: Payer: Self-pay | Admitting: Obstetrics & Gynecology

## 2019-12-18 NOTE — Telephone Encounter (Signed)
Call placed to convey benefits. Spoke with the patient and conveyed the benefits. Patient understands/agreeable with the benefits. Patient is aware of the cancellation policy. Appointment scheduled 12/26/19.

## 2019-12-19 ENCOUNTER — Telehealth: Payer: Self-pay | Admitting: Oncology

## 2019-12-19 ENCOUNTER — Encounter: Payer: Self-pay | Admitting: *Deleted

## 2019-12-19 NOTE — Telephone Encounter (Signed)
I discussed the recommendation from the 12/18/2019 thoracic oncology conference with Gina Cervantes.  Surgery and radiation are not recommended at this point.  The recommendation is to proceed with systemic therapy.  We discussed treatment with Alimta/carboplatin plus/minus pembrolizumab as first-line therapy.  I reviewed potential toxicities associated with this regimen.  She will be a candidate for treatment directed at the [place] mutation if she develops disease progression while on chemotherapy.  She does not wish to begin treatment until at least late September.  She agrees to a follow-up visit and further discussion on 01/29/2020.

## 2019-12-22 LAB — CYTOLOGY - PAP
Comment: NEGATIVE
Comment: NEGATIVE
Diagnosis: NEGATIVE
HPV 16: NEGATIVE
HPV 18 / 45: NEGATIVE
High risk HPV: POSITIVE — AB

## 2019-12-24 ENCOUNTER — Telehealth: Payer: Self-pay | Admitting: *Deleted

## 2019-12-24 IMAGING — MG MM DIGITAL SCREENING BILAT W/ TOMO W/ CAD
8 series · 9 of 24 positions shown · non-contrast
Comparison: Previous exam(s).

CLINICAL DATA: Screening.

EXAM:
DIGITAL SCREENING BILATERAL MAMMOGRAM WITH TOMO AND CAD

[R MLO synth-2D]
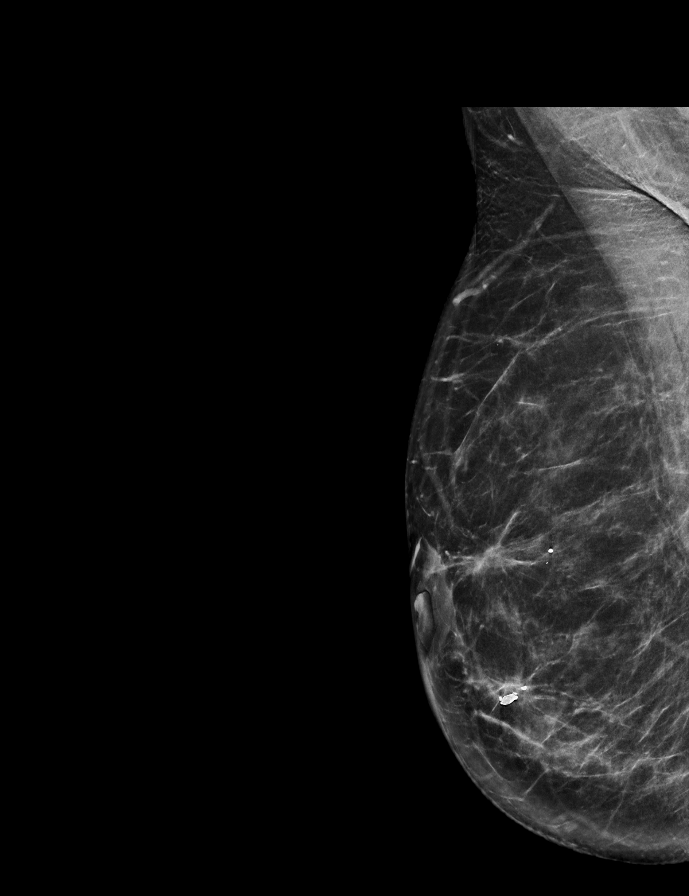

[L MLO synth-2D]
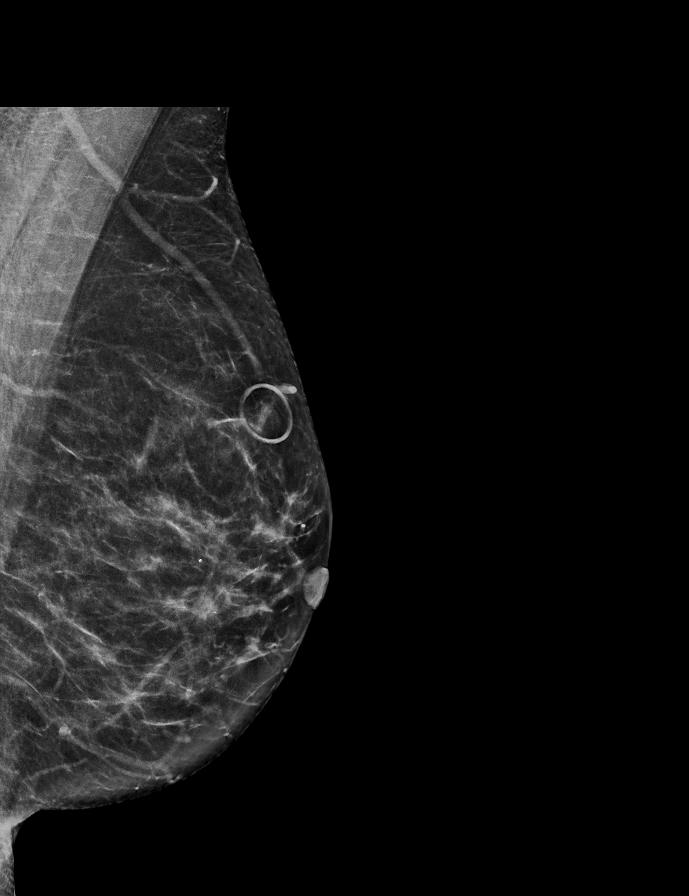

[L CC synth-2D]
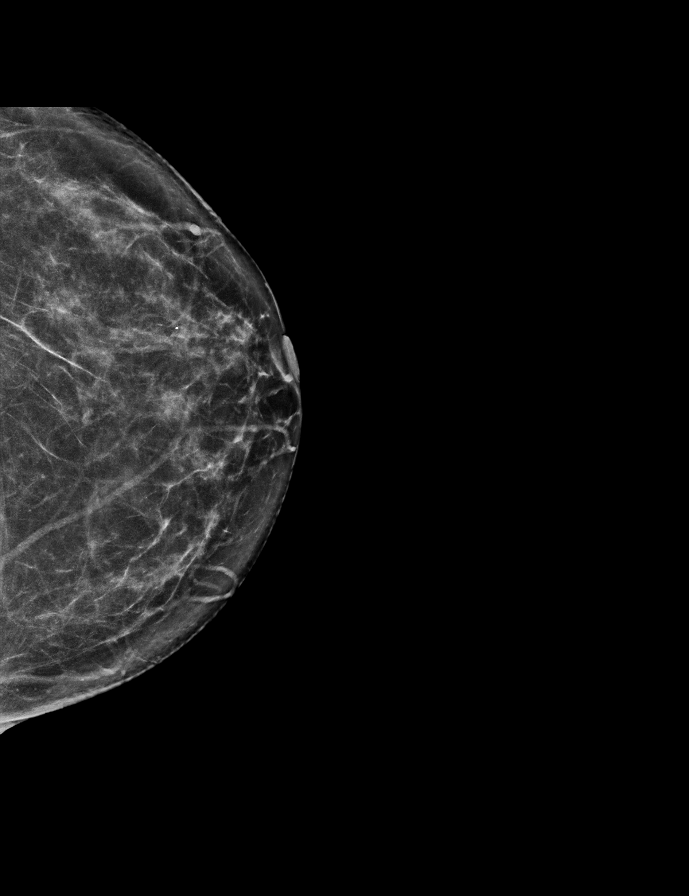

[R CC synth-2D]
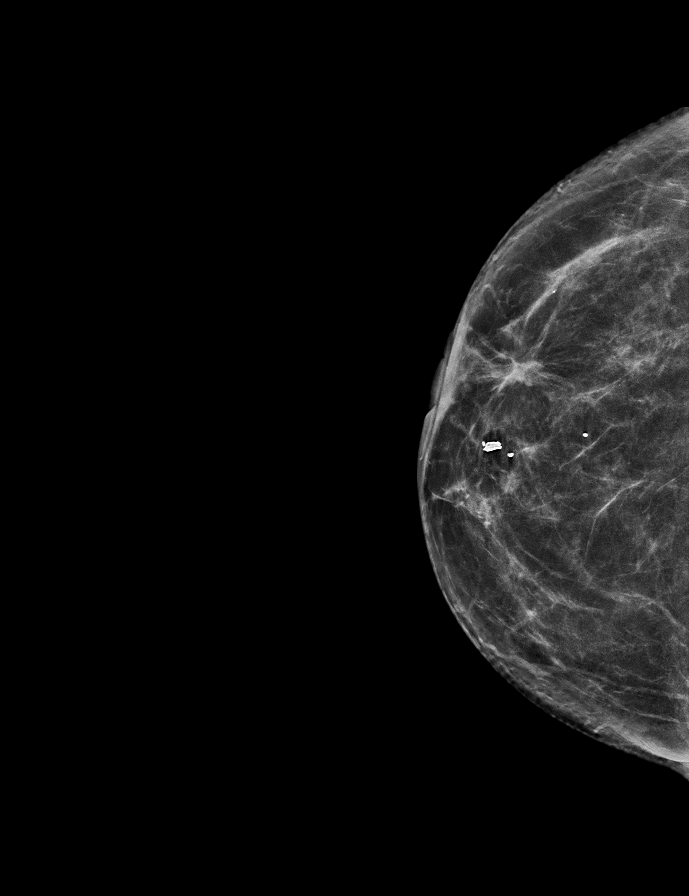

[R CC tomo · 2 of 69 frames shown]
[frame 23/69]
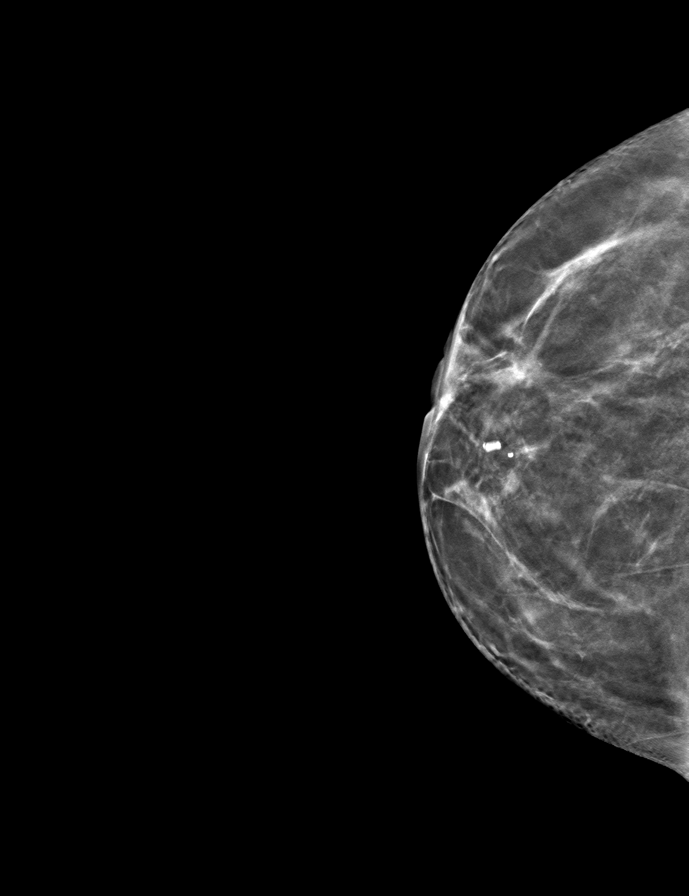
[frame 35/69]
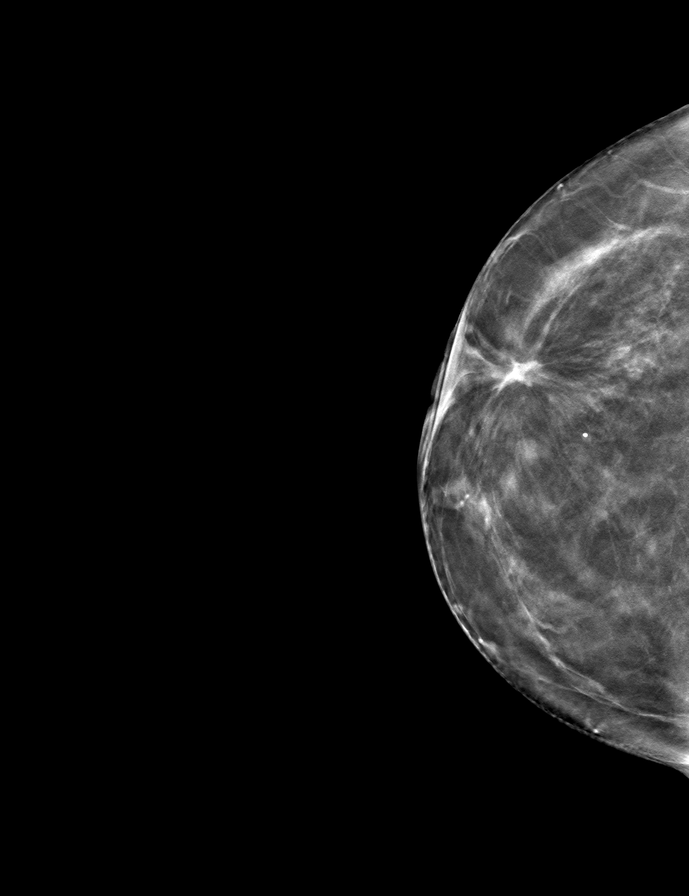

[L CC tomo · tomo slice 35/68.0]
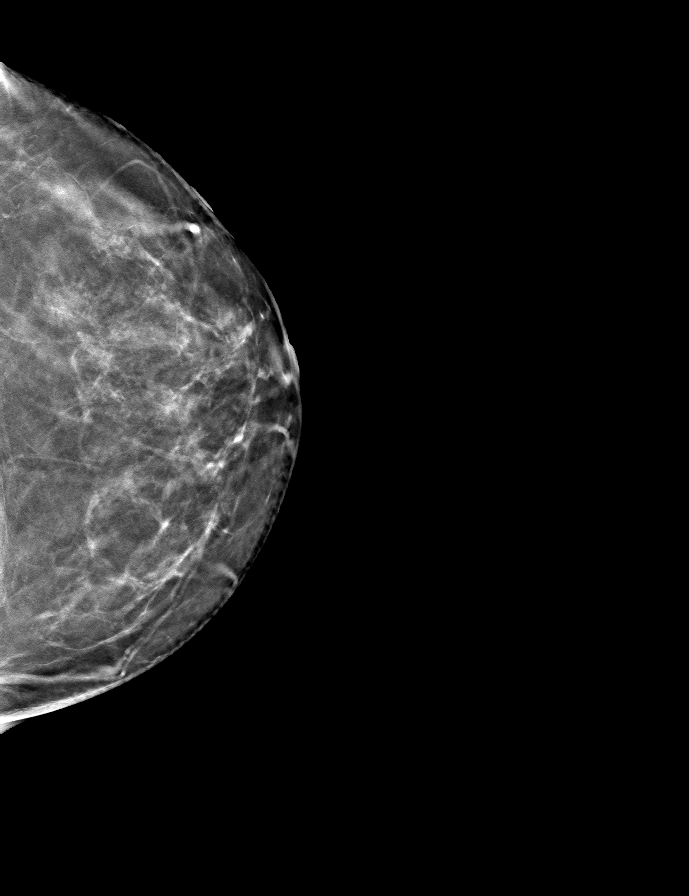

[R MLO tomo · tomo slice 35/69.0]
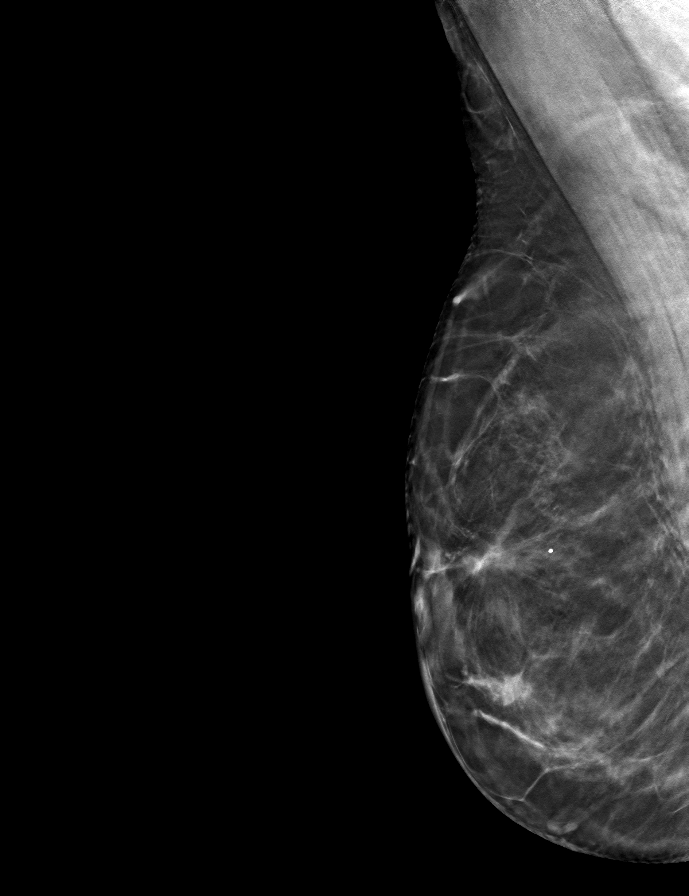

[L MLO tomo · tomo slice 33/66.0]
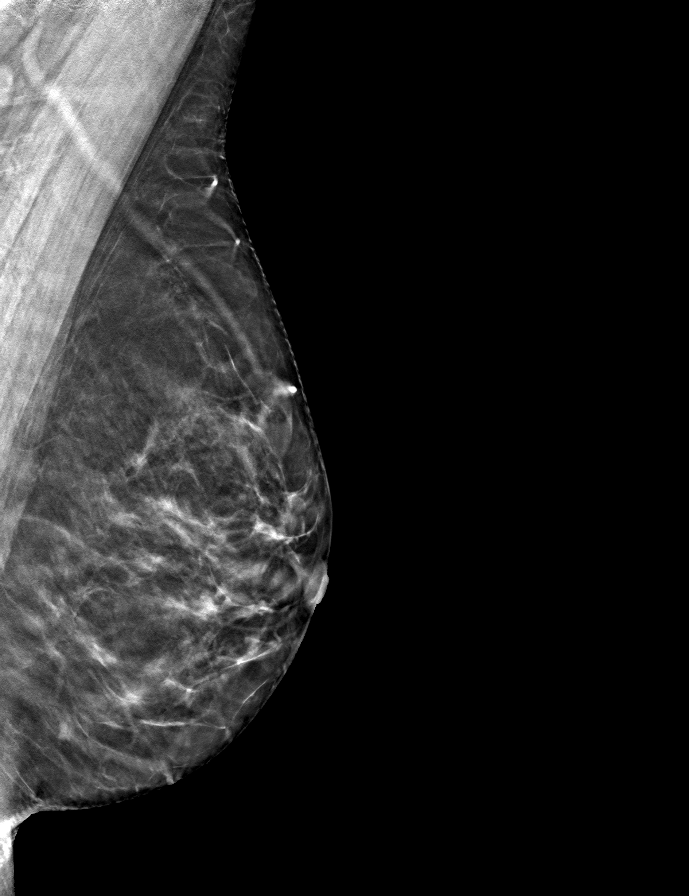

[9 of 24 positions shown; findings below may reference images not displayed]

ACR Breast Density Category b: There are scattered areas of
fibroglandular density.
FINDINGS: There are no findings suspicious for malignancy. Images were
processed with CAD.
IMPRESSION: No mammographic evidence of malignancy. A result letter of this
screening mammogram will be mailed directly to the patient.

RECOMMENDATION:
Screening mammogram in one year. (Code:[id number])

BI-RADS CATEGORY  1: Negative.

## 2019-12-24 NOTE — Telephone Encounter (Signed)
Per Dr. Benay Spice: He can see her and her husband on 8/19 at 3:30, 8/20 at 12:00 or 8/25 at 10:30. She chose to come tomorrow at 3:30.

## 2019-12-25 ENCOUNTER — Inpatient Hospital Stay (HOSPITAL_BASED_OUTPATIENT_CLINIC_OR_DEPARTMENT_OTHER): Payer: BC Managed Care – PPO | Admitting: Oncology

## 2019-12-25 ENCOUNTER — Other Ambulatory Visit: Payer: Self-pay

## 2019-12-25 VITALS — BP 125/84 | HR 101 | Temp 99.0°F | Resp 17 | Ht 64.5 in | Wt 141.9 lb

## 2019-12-25 DIAGNOSIS — C3490 Malignant neoplasm of unspecified part of unspecified bronchus or lung: Secondary | ICD-10-CM | POA: Diagnosis not present

## 2019-12-25 DIAGNOSIS — Z85048 Personal history of other malignant neoplasm of rectum, rectosigmoid junction, and anus: Secondary | ICD-10-CM | POA: Diagnosis not present

## 2019-12-25 DIAGNOSIS — C3412 Malignant neoplasm of upper lobe, left bronchus or lung: Secondary | ICD-10-CM

## 2019-12-25 DIAGNOSIS — Z7189 Other specified counseling: Secondary | ICD-10-CM | POA: Diagnosis not present

## 2019-12-25 DIAGNOSIS — Z9221 Personal history of antineoplastic chemotherapy: Secondary | ICD-10-CM | POA: Diagnosis not present

## 2019-12-25 DIAGNOSIS — Z923 Personal history of irradiation: Secondary | ICD-10-CM | POA: Diagnosis not present

## 2019-12-25 DIAGNOSIS — R918 Other nonspecific abnormal finding of lung field: Secondary | ICD-10-CM | POA: Diagnosis not present

## 2019-12-25 DIAGNOSIS — Z23 Encounter for immunization: Secondary | ICD-10-CM | POA: Diagnosis not present

## 2019-12-25 DIAGNOSIS — Z853 Personal history of malignant neoplasm of breast: Secondary | ICD-10-CM | POA: Diagnosis not present

## 2019-12-25 MED ORDER — ONDANSETRON HCL 8 MG PO TABS: 8.0000 mg | ORAL_TABLET | Freq: Three times a day (TID) | ORAL | 1 refills | Status: DC | PRN

## 2019-12-25 MED ORDER — FOLIC ACID 1 MG PO TABS: 1.0000 mg | ORAL_TABLET | Freq: Every day | ORAL | 1 refills | Status: DC

## 2019-12-25 NOTE — Progress Notes (Signed)
Patient was provided printed and verbal information on Alimta, Carboplatin and Keytruda

## 2019-12-25 NOTE — Progress Notes (Signed)
START ON PATHWAY REGIMEN - Non-Small Cell Lung     A cycle is every 21 days:     Pembrolizumab      Pemetrexed      Carboplatin   **Always confirm dose/schedule in your pharmacy ordering system**  Patient Characteristics: Stage IV Metastatic, Nonsquamous, Initial Chemotherapy/Immunotherapy, PS = 0, 1, ALK Rearrangement Negative and ROS1 Rearrangement Negative and NTRK Gene Fusion?Negative and RET Gene Fusion?Negative and EGFR Mutation Negative, PD-L1 Expression Positive  1-49% (TPS) / Negative / Not Tested / Awaiting Test Results and Immunotherapy Candidate Therapeutic Status: Stage IV Metastatic Histology: Nonsquamous Cell ROS1 Rearrangement Status: Negative Other Mutations/Biomarkers: No Other Actionable Mutations Chemotherapy/Immunotherapy LOT: Initial Chemotherapy/Immunotherapy Molecular Targeted Therapy: Not Appropriate KRAS G12C Mutation Status: G12C Positive MET Exon 14 Mutation Status: Negative RET Gene Fusion Status: Negative EGFR Mutation Status: Negative/Wild Type NTRK Gene Fusion Status: Negative PD-L1 Expression Status: PD-L1 Positive 1-49% (TPS) ALK Rearrangement Status: Negative BRAF V600E Mutation Status: Negative ECOG Performance Status: 0 Biomarker Assessment Status Confirmation: All Genomic Markers Negative, or Only MET+ or BRAF+ or KRAS G12C+ Immunotherapy Candidate Status: Candidate for Immunotherapy Intent of Therapy: Non-Curative / Palliative Intent, Discussed with Patient

## 2019-12-25 NOTE — Progress Notes (Signed)
°[facility] °OFFICE PROGRESS NOTE ° ° °Diagnosis: Non-small cell lung cancer, history of breast cancer and anal cancer ° °INTERVAL HISTORY:  ° °[name] returns as scheduled.  She continues to have dyspnea with the hot/humid weather.  No new complaint.  She is here today with her husband.  I discussed treatment options with her by telephone last week.  She is here today for further discussion. ° °Objective: °Vital signs in last 24 hours: ° °Blood pressure 125/84, pulse (!) 101, temperature 99 °F (37.2 °C), temperature source Tympanic, resp. rate 17, height 5' 4.5" (1.638 m), weight 141 lb 14.4 oz (64.4 kg), last menstrual period 04/07/2008, SpO2 95 %. °  °Physical examination-not performed today ° °Lab Results: ° °Lab Results  °Component Value Date  ° WBC 5.2 06/28/2018  ° HGB 15.5 (H) 06/28/2018  ° HCT 45.8 06/28/2018  ° MCV 103.6 (H) 06/28/2018  ° PLT 213 06/28/2018  ° NEUTROABS 3.3 06/28/2018  ° ° °CMP  °Lab Results  °Component Value Date  ° NA 140 05/03/2018  ° K 4.1 05/03/2018  ° CL 97 (L) 05/03/2018  ° CO2 31 05/03/2018  ° GLUCOSE 111 (H) 05/03/2018  ° BUN 9 05/03/2018  ° CREATININE 0.62 05/03/2018  ° CALCIUM 9.2 05/03/2018  ° PROT 7.2 05/03/2018  ° ALBUMIN 3.8 05/03/2018  ° AST 25 05/03/2018  ° ALT 18 05/03/2018  ° ALKPHOS 59 05/03/2018  ° BILITOT 1.2 05/03/2018  ° GFRNONAA >60 05/03/2018  ° GFRAA >60 05/03/2018  ° ° °No results found for: CEA1 ° °Lab Results  °Component Value Date  ° INR 1.04 06/07/2018  ° ° °Imaging: ° °No results found. ° °Medications: I have reviewed the patient's current medications. ° ° °Assessment/Plan: °1.Anal cancer-left anal margin/anal canal mass, status post a biopsy on 08/09/2012 confirming invasive moderately differentiated squamous cell carcinoma,p16 positive.   °-Staging PET scan 08/20/2012-hypermetabolic anal mass, no hypermetabolic metastases   °-Initiation of concurrent radiation with cycle 1 of mitomycin C./5-fluorouracil 09/02/2012.   °-She completed  cycle 2 5-fluorouracil/mitomycin C. beginning 10/01/2012.   °-She completed radiation 10/10/2012.   °2. Right breast cancer 2009, ER positive, PR positive, HER-2 positive. She is status post neoadjuvant AC x4 cycles followed by Taxol/Herceptin. She underwent a right lumpectomy and sentinel lymph node biopsy 09/22/2008 with no evidence of residual invasive carcinoma and 5 negative sentinel lymph nodes. She then completed right breast radiation. She began tamoxifen 12/15/2008 and completed adjuvant Herceptin 05/25/2009. The tamoxifen was discontinued and she was switched to Arimidex in July 2013.  Arimidex discontinued in mid July 2019. °3. History of enlargement of the right tonsil.   °4. Nonspecific pulmonary nodules without hypermetabolic activity on the PET scan 08/20/2012. Chest CT 10/21/2012 with scattered pulmonary nodules including nodules that were not present in 2010. Annual surveillance was recommended. Follow-up chest CT 10/24/2013 with scattered groundglass densities again noted in both lungs mostly unchanged when compared to the previous study. A sub-solid right lower lobe nodule had increased central density. The size was unchanged.The nodule has been present since 2009. °· CT chest 06/13/2016-a sub-solid 9 mm right upper lobe nodule has enlarged compared to 2015, a right lower lobe irregular nodular density appears more well defined, new vague 5 mm groundglass left upper lobe nodule, left lower lobe 10 mm groundglass nodule is more apparent, posterior left upper lobe circumscribed solid nodule measures 11 x 10 mm and is new °· PET scan 06/22/2016-malignant range activity involving the 11 mm left upper lobe nodule, no other hypermetabolic nodules, suspicious sub-solid right lower   lobe nodule °· Wedge resection of a hypermetabolic left upper lobe nodule on 07/05/2016-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 No EGFR, ALK, BRAF, RET, ERBB2, or ROS1 alteration °· CT chest 11/14/2016-status post left upper lobe wedge resection, stable right lung nodules °· Chest CT 05/02/2017-stable bilateral solid and groundglass nodules, stable mild mediastinal lymphadenopathy °· CT chest 12/13/2017- enlargement of right upper lobe nodule, other lung nodules and chest lymph node stable °· PET 01/09/2018- right upper lobe nodule and right lower lobe nodule have enlarged since 2018 and have low level metabolic activity increased size of an 8 mm right upper lobe nodule without increased metabolic activity, stable activity and a mildly enlarged lower paratracheal node, no evidence of metastatic disease in the abdomen or pelvis °· CT chest 04/22/2018- no new lung nodules, dominant right lung nodules slightly increased in size °· Bronchoscopy/EBUS 05/04/2019 with biopsy of level 7 node, biopsy of right lower lobe nodule, FNA of right lower lobe nodule-negative °· CT biopsy of right upper lobe nodule 06/07/2018- adenocarcinoma with lepidic and acinar patterns °· SBRT to the dominant right upper lobe nodule 07/23/2018-07/30/2018 °· CT 09/16/2018-slight increase in size of medial right upper lobe nodule-possibly secondary to interval radiation, other nodules are unchanged °· CT 12/25/2018- medial right upper lobe nodule has decreased in size slightly, multiple additional solid and groundglass nodules are stable °· CT 06/09/2019-enlarging right lower lobe nodule, enlarging subsolid nodule, new right upper lobe nodule °· SBRT to right lower lobe lung nodule, 3 fractions 07/08/2019-07/14/2019 °· CT 12/05/2019-dominant right lower lobe lesion stable to slightly decreased in size, progressive solid component associated with a dominant right upper lung nodule, mild progression of a left upper lobe nodule, new subsolid anterior medial left upper lobe nodule ° ° °Disposition: °[name] has non-small cell lung cancer.  The CT on  12/05/2019 is consistent with progression of multifocal lung tumors.  Her case was presented at the GI tumor conference on 12/18/2019.  The consensus opinion is to proceed with systemic therapy. ° °I discussed treatment options with [name] and her husband.  She understands no therapy will be curative.  The resected tumor in 2018 had low-level PD1 expression.  I recommend first-line treatment with Alimta/carboplatin/pembrolizumab.  We discussed treatment now versus at a later date.  She prefers to proceed with systemic therapy.  She is concerned the dyspnea is related to the lung cancer as opposed to COPD. ° °I reviewed potential toxicities associated with the Alimta/carboplatin regimen.  We discussed the potential for nausea/vomiting, mucositis, alopecia, rash, eye toxicity, and allergic reaction, and hematologic toxicity.  We discussed the rash, diarrhea, and various autoimmune toxicities associated with pembrolizumab.  She was given reading materials on these agents today.  She agrees to proceed. ° °She would like to begin treatment after her son visits next month.  She is scheduled for an office visit and cycle 1 systemic therapy on 01/29/2020. ° °[name] will obtain a COVID-19 booster vaccine. ° °Gary Sherrill, MD ° °12/25/2019  °4:52 PM ° ° °

## 2019-12-26 ENCOUNTER — Telehealth: Payer: Self-pay | Admitting: *Deleted

## 2019-12-26 ENCOUNTER — Other Ambulatory Visit (HOSPITAL_COMMUNITY)
Admission: RE | Admit: 2019-12-26 | Discharge: 2019-12-26 | Disposition: A | Discharge status: Home / Self Care | Payer: BC Managed Care – PPO | Source: Ambulatory Visit | Attending: Obstetrics & Gynecology | Admitting: Obstetrics & Gynecology

## 2019-12-26 ENCOUNTER — Telehealth: Payer: Self-pay | Admitting: Oncology

## 2019-12-26 ENCOUNTER — Ambulatory Visit (INDEPENDENT_AMBULATORY_CARE_PROVIDER_SITE_OTHER): Payer: BC Managed Care – PPO | Admitting: Obstetrics & Gynecology

## 2019-12-26 ENCOUNTER — Encounter: Payer: Self-pay | Admitting: Obstetrics & Gynecology

## 2019-12-26 DIAGNOSIS — N870 Mild cervical dysplasia: Secondary | ICD-10-CM | POA: Diagnosis not present

## 2019-12-26 DIAGNOSIS — B977 Papillomavirus as the cause of diseases classified elsewhere: Secondary | ICD-10-CM

## 2019-12-26 DIAGNOSIS — N72 Inflammatory disease of cervix uteri: Secondary | ICD-10-CM

## 2019-12-26 NOTE — Telephone Encounter (Signed)
Scheduled appointments per 8/19 los. Left message for patient with appointments date and time.

## 2019-12-26 NOTE — Telephone Encounter (Signed)
I received an update from Dr. Benay Spice that Gina Cervantes will be starting chemo and will have an option for oral biologic.  I called her today to touch base and see if there was something I could do for her.  She states she is ok and nothing is needed at this time. She appreciated the call.

## 2019-12-26 NOTE — Progress Notes (Signed)
59 y.o. Married Not Hispanic or Latino female here for colposcopy with possible biopsies and/or ECC due to normal pap with +HR HPV obtained 12/10/2019.  This was second year of positive HR HPV so according to newer ASCCP guidelines, colposcopy indicated.  Pt reports she is going to start treatment for lung cancer with chemotherapy.  Followed by Dr. Benay Spice.  She is going to get updated covid booster before starting.  We had discussed pneumonia vaccinations and I reviewed with Dr. Benay Spice but she wants the Covid booster first and then will consider pneumonia vaccinations after that time.  Patient's last menstrual period was 04/07/2008.          Sexually active: Yes.    The current method of family planning is post menopausal status.     Patient has been counseled about results and procedure.  Risks and benefits have bene reviewed including immediate and/or delayed bleeding, infection, cervical scaring from procedure, possibility of needing additional follow up as well as treatment.  rare risks of missing a lesion discussed as well.  All questions answered.  Pt ready to proceed.  BP 114/60   Pulse 68   Resp 16   Wt 141 lb (64 kg)   LMP 04/07/2008   BMI 23.83 kg/m   Physical Exam Constitutional:      Appearance: Normal appearance.  Genitourinary:    General: Normal vulva.     Vagina: Normal.     Neurological:     General: No focal deficit present.     Mental Status: She is alert and oriented to person, place, and time.    Speculum placed.  3% acetic acid applied to cervix for >45 seconds.  Cervix visualized with both 7.5X and 15X magnification.  Green filter also used.  Lugols solution was used.  Findings:  No AWE or decreased staining with lugol's solution.  Small area c/w HPV noted at 6 o'clock.  Biopsy:  Not obtained.  ECC:  was performed.  Monsel's was not needed.  Excellent hemostasis was present.  Pt tolerated procedure well and all instruments were removed.  Findings noted above  on picture of cervix.  Chaperone, Royal Hawthorn, was present during procedure.  Assessment:  +HR HPV on pap smear  Plan:  Pathology results will be called to patient and follow-up planned pending results.

## 2019-12-29 DIAGNOSIS — B977 Papillomavirus as the cause of diseases classified elsewhere: Secondary | ICD-10-CM | POA: Insufficient documentation

## 2019-12-30 LAB — SURGICAL PATHOLOGY

## 2020-01-01 ENCOUNTER — Telehealth: Payer: Self-pay | Admitting: *Deleted

## 2020-01-01 ENCOUNTER — Telehealth: Payer: Self-pay | Admitting: Oncology

## 2020-01-01 NOTE — Telephone Encounter (Signed)
Scheduled appt per 8/25 sch msg- pt is aware of appt date and time

## 2020-01-01 NOTE — Telephone Encounter (Signed)
Burnice Logan, RN  01/01/2020 8:42 AM EDT Back to Top    Left message to call Sharee Pimple, RN at [place].   Removed from current recall.  New recall placed.

## 2020-01-01 NOTE — Telephone Encounter (Signed)
-----   Message from Megan Salon, MD sent at 12/31/2019  2:17 PM EDT ----- Please let pt know her pathology showed grade 1 dysplasia.  Ok to monitor.  Repeat pap/HR HPV 1 year.  Needs 12 month recall and removal from current recall.  CC:  Royal Hawthorn, CMA

## 2020-01-01 NOTE — Telephone Encounter (Signed)
Spoke with patient. Advised of results as seen below per Dr. Sabra Heck.  Patient declines to schedule AEX at this time. Recall entered and patient added to wait list. Patient verbalizes understanding and is agreeable.   Encounter closed.

## 2020-01-05 ENCOUNTER — Other Ambulatory Visit: Payer: Self-pay

## 2020-01-05 ENCOUNTER — Inpatient Hospital Stay: Payer: BC Managed Care – PPO

## 2020-01-05 DIAGNOSIS — Z23 Encounter for immunization: Secondary | ICD-10-CM

## 2020-01-08 ENCOUNTER — Ambulatory Visit: Payer: BC Managed Care – PPO

## 2020-01-23 ENCOUNTER — Emergency Department (HOSPITAL_BASED_OUTPATIENT_CLINIC_OR_DEPARTMENT_OTHER)
Admission: EM | Admit: 2020-01-23 | Discharge: 2020-01-23 | Disposition: A | Discharge status: Home / Self Care | Payer: BC Managed Care – PPO | Attending: Emergency Medicine | Admitting: Emergency Medicine

## 2020-01-23 ENCOUNTER — Encounter (HOSPITAL_BASED_OUTPATIENT_CLINIC_OR_DEPARTMENT_OTHER): Payer: Self-pay

## 2020-01-23 ENCOUNTER — Other Ambulatory Visit: Payer: Self-pay

## 2020-01-23 DIAGNOSIS — Z85118 Personal history of other malignant neoplasm of bronchus and lung: Secondary | ICD-10-CM | POA: Insufficient documentation

## 2020-01-23 DIAGNOSIS — Z5321 Procedure and treatment not carried out due to patient leaving prior to being seen by health care provider: Secondary | ICD-10-CM | POA: Insufficient documentation

## 2020-01-23 DIAGNOSIS — M25562 Pain in left knee: Secondary | ICD-10-CM | POA: Insufficient documentation

## 2020-01-23 NOTE — ED Triage Notes (Signed)
Pt arrives with pain to left knee [place] the beach [place] a lot of activity and feeling her knee swell, pt states that what concerned her is when she noticed swelling to her posterior left leg. Pt reports room air levels normally run in low 90s states she is about to start chemo for lung cancer.

## 2020-01-23 NOTE — Progress Notes (Signed)
Pharmacist Chemotherapy Monitoring - Initial Assessment    Anticipated start date: 01/29/2020   Regimen:  . Are orders appropriate based on the patient's diagnosis, regimen, and cycle? Yes . Does the plan date match the patient's scheduled date? Yes . Is the sequencing of drugs appropriate? Yes . Are the premedications appropriate for the patient's regimen? Yes . Prior Authorization for treatment is: Approved o If applicable, is the correct biosimilar selected based on the patient's insurance? n/a  Organ Function and Labs: Marland Kitchen Are dose adjustments needed based on the patient's renal function, hepatic function, or hematologic function? Yes . Are appropriate labs ordered prior to the start of patient's treatment? Yes . Other organ system assessment, if indicated: N/A . The following baseline labs, if indicated, have been ordered: pembrolizumab: baseline TSH +/- T4  Dose Assessment: . Are the drug doses appropriate? Yes . Are the following correct: o Drug concentrations Yes o IV fluid compatible with drug Yes o Administration routes Yes o Timing of therapy Yes . If applicable, does the patient have documented access for treatment and/or plans for port-a-cath placement? no . If applicable, have lifetime cumulative doses been properly documented and assessed? yes Lifetime Dose Tracking  . Doxorubicin: 240 mg/m2 (428 mg) = 53.33 % of the maximum lifetime dose of 450 mg/m2  o   Toxicity Monitoring/Prevention: . The patient has the following take home antiemetics prescribed: Ondansetron . The patient has the following take home medications prescribed: folic acid for pemetrexed  And b12  . Medication allergies and previous infusion related reactions, if applicable, have been reviewed and addressed. No . The patient's current medication list has been assessed for drug-drug interactions with their chemotherapy regimen. no significant drug-drug interactions were identified on review.  Order  Review: . Are the treatment plan orders signed? No . Is the patient scheduled to see a provider prior to their treatment? Yes  I verify that I have reviewed each item in the above checklist and answered each question accordingly.  Trayton Szabo D 01/23/2020 10:08 AM

## 2020-01-24 ENCOUNTER — Emergency Department (HOSPITAL_COMMUNITY)
Admission: EM | Admit: 2020-01-24 | Discharge: 2020-01-24 | Disposition: A | Discharge status: Home / Self Care | Payer: BC Managed Care – PPO | Attending: Emergency Medicine | Admitting: Emergency Medicine

## 2020-01-24 ENCOUNTER — Other Ambulatory Visit: Payer: Self-pay

## 2020-01-24 ENCOUNTER — Emergency Department (HOSPITAL_BASED_OUTPATIENT_CLINIC_OR_DEPARTMENT_OTHER)
Admit: 2020-01-24 | Discharge: 2020-01-24 | Disposition: A | Discharge status: Home / Self Care | Payer: BC Managed Care – PPO | Attending: Emergency Medicine | Admitting: Emergency Medicine

## 2020-01-24 ENCOUNTER — Other Ambulatory Visit: Payer: Self-pay | Admitting: Oncology

## 2020-01-24 DIAGNOSIS — C3490 Malignant neoplasm of unspecified part of unspecified bronchus or lung: Secondary | ICD-10-CM | POA: Insufficient documentation

## 2020-01-24 DIAGNOSIS — M7122 Synovial cyst of popliteal space [Baker], left knee: Secondary | ICD-10-CM | POA: Diagnosis not present

## 2020-01-24 DIAGNOSIS — Z85048 Personal history of other malignant neoplasm of rectum, rectosigmoid junction, and anus: Secondary | ICD-10-CM | POA: Insufficient documentation

## 2020-01-24 DIAGNOSIS — Z853 Personal history of malignant neoplasm of breast: Secondary | ICD-10-CM | POA: Diagnosis not present

## 2020-01-24 DIAGNOSIS — Z87891 Personal history of nicotine dependence: Secondary | ICD-10-CM | POA: Diagnosis not present

## 2020-01-24 DIAGNOSIS — M79609 Pain in unspecified limb: Secondary | ICD-10-CM

## 2020-01-24 DIAGNOSIS — M79662 Pain in left lower leg: Secondary | ICD-10-CM | POA: Diagnosis not present

## 2020-01-24 NOTE — ED Notes (Signed)
Pt ambulatory from triage 

## 2020-01-24 NOTE — ED Notes (Signed)
Discharge paperwork reviewed with pt. Pt ambulatory at discharge, no concerns or needs expressed prior to departure.

## 2020-01-24 NOTE — ED Triage Notes (Signed)
Patient reports she may have a blood clot because her left calf started swelling and became red. Pain rated 9/10

## 2020-01-24 NOTE — ED Provider Notes (Signed)
[facility] DEPT Provider Note   CSN: [id number] Arrival date & time: 01/24/20  1039     History Chief Complaint  Patient presents with  . calf pain    Gina Cervantes is a 59 y.o. female.  HPI  Patient is a 59 year old female with past medical history detailed below significant for history of cancer including breast cancer, anal cancer, lung cancer.  She follows with oncology here at [facility].  She does have chemotherapy regimen.  She states she is bringing day for left calf swelling and pain.  She states that she has issues with her right knee with arthritis and has had Baker's cyst in the past however she states that this feels more swollen and more painful than it has in the past.  She also recently took a long car ride to the beach and states that that is when she started having swelling.  She states that she has no chest pain or shortness of breath.  She is not on any hormonal medicines, no trauma or immobilization no recent surgeries.  She has not on any blood thinners.  She has never had a blood clot in the past.     Past Medical History:  Diagnosis Date  . Anal cancer ([place]) 08/09/2012   Squamous cell  . Anxiety    PANIC ATTACKS  . Arthritis   . Breast cancer ([place]) 2009   ER+PR+HER-2+  . Ductal carcinoma ([place]) 02/2008    invasive   . Heart palpitations   . History of radiation therapy 09/02/12-10/10/12   anal 50.4Gy  . History of shingles 10/2014   . HPV (human papilloma virus) anogenital infection    vulvar/ freezing hx  . Hx of radiation therapy 2020  . Lung nodule   . Malignant neoplasm of upper lobe of left lung ([place]) 06/2016  . Personal history of chemotherapy   . Personal history of radiation therapy 2010  . Pneumonia    NO RECENT PROBLEMS  . PONV (postoperative nausea and vomiting)    Hypotension  . Radiation 10/21/08-12/08/08   Right breast 6240 cGy  . Shingles 2017  . Status post chemotherapy 02/2008    Taxol/Herceptin    Patient Active Problem List   Diagnosis Date Noted  . High risk human papilloma virus (HPV) infection of cervix 12/29/2019  . Goals of care, counseling/discussion 12/25/2019  . Malignant neoplasm of lower lobe of right lung ([place]) 06/26/2019  . Malignant neoplasm of bronchus of right upper lobe ([place]) 07/16/2018  . Lung cancer, left  upper lobe ([place]) 07/05/2016  . Anal cancer ([place]) 08/15/2012  . Breast cancer, right ([place]) 06/04/2007    Past Surgical History:  Procedure Laterality Date  . BREAST BIOPSY Right 02/04/2008   malignant  . BREAST LUMPECTOMY Right 2010   malignant  . BREAST LUMPECTOMY WITH AXILLARY LYMPH NODE BIOPSY Right 5/10   Dr. Marlou Starks  . CESAREAN SECTION    . COLONOSCOPY W/ POLYPECTOMY    . EVALUATION UNDER ANESTHESIA WITH ANAL FISTULECTOMY N/A 08/09/2012   Procedure: EXAM UNDER ANESTHESIA AND BIOPSY OF ANAL MASS;  Surgeon: Odis Hollingshead, MD;  Location: [facility];  Service: General;  Laterality: N/A;  . KNEE ARTHROSCOPY Left    left knee  . LUNG LOBECTOMY Left   . PORTACATH PLACEMENT  03/2008   dr. Marlou Starks   . REMOVAL PORTACATH  2011  . VIDEO ASSISTED THORACOSCOPY (VATS)/WEDGE RESECTION Left 07/05/2016   Procedure: VIDEO ASSISTED THORACOSCOPY (VATS)/WEDGE RESECTION left  upper lobe,  lymph node dissection and placement of OnQ catheter;  Surgeon: [name], MD;  Location: [facility];  Service: Thoracic;  Laterality: Left;  Marland Kitchen VIDEO BRONCHOSCOPY N/A 07/05/2016   Procedure: VIDEO BRONCHOSCOPY;  Surgeon: [name], MD;  Location: [facility];  Service: Thoracic;  Laterality: N/A;  . VIDEO BRONCHOSCOPY N/A 05/03/2018   Procedure: VIDEO BRONCHOSCOPY;  Surgeon: [name], MD;  Location: [facility];  Service: Thoracic;  Laterality: N/A;  . VIDEO BRONCHOSCOPY WITH ENDOBRONCHIAL NAVIGATION N/A 05/03/2018   Procedure: VIDEO BRONCHOSCOPY WITH ENDOBRONCHIAL NAVIGATION WITH BIOPSY;  Surgeon: [name], MD;  Location: [facility];  Service: Thoracic;   Laterality: N/A;  . VIDEO BRONCHOSCOPY WITH ENDOBRONCHIAL ULTRASOUND N/A 05/03/2018   Procedure: VIDEO BRONCHOSCOPY WITH ENDOBRONCHIAL ULTRASOUND;  Surgeon: [name], MD;  Location: [facility] OR;  Service: Thoracic;  Laterality: N/A;     OB History    Gravida  4   Para  2   Term      Preterm      AB  2   Living  1     SAB  2   TAB      Ectopic      Multiple      Live Births  2        Obstetric Comments  G2,P2 menarce age 80 Oldest son passed 2014        Family History  Problem Relation Age of Onset  . Lung cancer Father   . Stroke Mother   . Breast cancer Neg Hx     Social History   Tobacco Use  . Smoking status: Former Smoker    Years: 8.00    Types: Cigarettes    Quit date: 2015    Years since quitting: 6.7  . Smokeless tobacco: Never Used  . Tobacco comment: stopped smoking cigarettes Jan. 2018  Vaping Use  . Vaping Use: Never used  Substance Use Topics  . Alcohol use: Yes    Alcohol/week: 5.0 - 20.0 standard drinks    Types: 5 - 20 Glasses of wine per week  . Drug use: No    Home Medications Prior to Admission medications   Medication Sig Start Date End Date Taking? Authorizing Provider  albuterol (VENTOLIN HFA) 108 (90 Base) MCG/ACT inhaler TAKE 2 PUFFS BY MOUTH EVERY 6 HOURS AS NEEDED FOR WHEEZE OR SHORTNESS OF BREATH 12/09/19  Yes Ladell Pier, MD  CALCIUM PO Take 1 tablet by mouth daily.   Yes [provider]  loratadine (CLARITIN) 10 MG tablet Take 10 mg by mouth as needed for allergies.   Yes [provider]  LORazepam (ATIVAN) 0.5 MG tablet Take 1 tablet (0.5 mg total) by mouth every 6 (six) hours as needed for anxiety (or sleep). 12/10/19  Yes Megan Salon, MD  Multiple Vitamins-Minerals (MULTIVITAMIN WITH MINERALS) tablet Take 1 tablet by mouth daily.   Yes [provider]  POTASSIUM CHLORIDE PO Take 1 tablet by mouth daily.    Yes [provider]  folic acid (FOLVITE) 1 MG tablet Take 1  tablet (1 mg total) by mouth daily. Patient not taking: Reported on 12/26/2019 01/28/20   Ladell Pier, MD  ondansetron (ZOFRAN) 8 MG tablet Take 1 tablet (8 mg total) by mouth every 8 (eight) hours as needed for nausea or vomiting. Patient not taking: Reported on 12/26/2019 12/25/19   Ladell Pier, MD    Allergies    No known allergies  Review of Systems   Review of Systems  Constitutional: Negative for fever.  HENT: Negative for congestion.   Respiratory: Negative for shortness of breath.   Cardiovascular: Negative for chest pain.  Gastrointestinal: Negative for abdominal distention.  Musculoskeletal:       Leg swelling left  Neurological: Negative for dizziness and headaches.    Physical Exam Updated Vital Signs BP 125/84   Pulse 91   Temp 98.3 F (36.8 C)   Resp 17   Ht '5\' 5"'  (1.651 m)   Wt 64.4 kg   LMP 04/07/2008   SpO2 96%   BMI 23.63 kg/m   Physical Exam Vitals and nursing note reviewed.  Constitutional:      General: She is not in acute distress. HENT:     Head: Normocephalic and atraumatic.     Nose: Nose normal.     Mouth/Throat:     Mouth: Mucous membranes are moist.  Eyes:     General: No scleral icterus. Cardiovascular:     Rate and Rhythm: Normal rate and regular rhythm.     Pulses: Normal pulses.     Heart sounds: Normal heart sounds.     Comments:  Bilateral DP PT pulses are 3+ and symmetric.  Normal heart rate. Pulmonary:     Effort: Pulmonary effort is normal. No respiratory distress.     Breath sounds: No wheezing.  Abdominal:     Palpations: Abdomen is soft.     Tenderness: There is no abdominal tenderness. There is no guarding or rebound.  Musculoskeletal:     Cervical back: Normal range of motion.     Comments: Nonpitting swelling to the left leg.  Is primarily from the left proximal tibia to the mid shin.  Strength 5/5 bilateral lower extremities with flexion extension.  No significant calf tenderness to palpation.  No  significant bruising or deformity.  No wounds lacerations or abrasions.   Skin:    General: Skin is warm and dry.     Capillary Refill: Capillary refill takes less than 2 seconds.     Comments: No bruising step-off or deformity.  Neurological:     Mental Status: She is alert. Mental status is at baseline.  Psychiatric:        Mood and Affect: Mood normal.        Behavior: Behavior normal.     ED Results / Procedures / Treatments   Labs (all labs ordered are listed, but only abnormal results are displayed) Labs Reviewed - No data to display  EKG None  Radiology VAS [place] LOWER EXTREMITY VENOUS (DVT) ([facility] 7a-7p)  Result Date: 01/24/2020  Lower Venous DVT Study Indications: Pain.  Risk Factors: Cancer. Comparison Study: No prior studies. Performing Technologist: Oliver Hum RVT  Examination Guidelines: A complete evaluation includes B-mode imaging, spectral Doppler, color Doppler, and power Doppler as needed of all accessible portions of each vessel. Bilateral testing is considered an integral part of a complete examination. Limited examinations for reoccurring indications may be performed as noted. The reflux portion of the exam is performed with the patient in reverse Trendelenburg.  +-----+---------------+---------+-----------+----------+--------------+ RIGHTCompressibilityPhasicitySpontaneityPropertiesThrombus Aging +-----+---------------+---------+-----------+----------+--------------+ CFV  Full           Yes      Yes                                 +-----+---------------+---------+-----------+----------+--------------+   +---------+---------------+---------+-----------+----------+--------------+ LEFT  CompressibilityPhasicitySpontaneityPropertiesThrombus Aging +---------+---------------+---------+-----------+----------+--------------+ CFV      Full           Yes      Yes                                  +---------+---------------+---------+-----------+----------+--------------+ SFJ      Full                                                        +---------+---------------+---------+-----------+----------+--------------+ FV Prox  Full                                                        +---------+---------------+---------+-----------+----------+--------------+ FV Mid   Full                                                        +---------+---------------+---------+-----------+----------+--------------+ FV DistalFull                                                        +---------+---------------+---------+-----------+----------+--------------+ PFV      Full                                                        +---------+---------------+---------+-----------+----------+--------------+ POP      Full           Yes      Yes                                 +---------+---------------+---------+-----------+----------+--------------+ PTV      Full                                                        +---------+---------------+---------+-----------+----------+--------------+ PERO     Full                                                        +---------+---------------+---------+-----------+----------+--------------+     Summary: RIGHT: - No evidence of common femoral vein obstruction.  LEFT: - There is no evidence of deep vein thrombosis in the lower extremity.  - A large cystic structure is found in the popliteal fossa that appears to be draining into the proximal calf.  *See  table(s) above for measurements and observations.    Preliminary     Procedures Procedures (including critical care time)  Medications Ordered in ED Medications - No data to display  ED Course  I have reviewed the triage vital signs and the nursing notes.  Pertinent labs & imaging results that were available during my care of the patient were reviewed by me and considered in  my medical decision making (see chart for details).    MDM Rules/Calculators/A&P                          Patient is 59 year old female with cancer patient presented today with left leg swelling.  Concern for DVT.  Physical exam is notable for nonpitting edema and swelling of the left leg.  She has good pulses bilaterally and good cap refill.  Overall reassuring exam however do have high suspicion for blood clot.  If negative for blood clot will likely treat conservatively with Tylenol rest ice elevation and compression.  There was significant delay as there was some issue with the [place] report crossing over.   Informed of her negative study.  She is relieved.  Understands conservative management of Baker's cyst.  She will follow up with her primary care doctor or Ortho if needed.  Given return precautions.  Compartments are soft.  Final Clinical Impression(s) / ED Diagnoses Final diagnoses:  Synovial cyst of left popliteal space    Rx / DC Orders ED Discharge Orders    None       Tedd Sias, [place] 01/24/20 1721    Tegeler, Gwenyth Allegra, MD 01/26/20 1038

## 2020-01-24 NOTE — Progress Notes (Signed)
Left lower extremity venous duplex has been completed. Preliminary results can be found in CV Proc through chart review.  Results were given to Dr. Sherry Ruffing.  01/24/20 4:02 PM Gina Cervantes RVT

## 2020-01-24 NOTE — Discharge Instructions (Addendum)
Your ultrasound was negative for a blood clot.  There is a Baker's cyst that appears to be draining into your calf.  Please wear compression stockings/socks that you may buy over-the-counter at [facility] or other such store. You may use Tylenol for pain and may use cool compresses.  Please elevate your leg and follow-up with your primary care doctor.

## 2020-01-28 MED FILL — Dexamethasone Sodium Phosphate Inj 100 MG/10ML: INTRAMUSCULAR | Qty: 1 | Status: AC

## 2020-01-28 MED FILL — Fosaprepitant Dimeglumine For IV Infusion 150 MG (Base Eq): INTRAVENOUS | Qty: 5 | Status: AC

## 2020-01-29 ENCOUNTER — Other Ambulatory Visit: Payer: Self-pay

## 2020-01-29 ENCOUNTER — Inpatient Hospital Stay: Payer: BC Managed Care – PPO

## 2020-01-29 ENCOUNTER — Inpatient Hospital Stay: Payer: BC Managed Care – PPO | Attending: Oncology | Admitting: Oncology

## 2020-01-29 ENCOUNTER — Ambulatory Visit (HOSPITAL_COMMUNITY)
Admission: RE | Admit: 2020-01-29 | Discharge: 2020-01-29 | Disposition: A | Discharge status: Home / Self Care | Payer: BC Managed Care – PPO | Source: Ambulatory Visit | Attending: Oncology | Admitting: Oncology

## 2020-01-29 VITALS — BP 112/66 | HR 88 | Temp 99.4°F | Resp 16 | Ht 65.0 in

## 2020-01-29 DIAGNOSIS — C3412 Malignant neoplasm of upper lobe, left bronchus or lung: Secondary | ICD-10-CM

## 2020-01-29 DIAGNOSIS — M25562 Pain in left knee: Secondary | ICD-10-CM | POA: Diagnosis not present

## 2020-01-29 DIAGNOSIS — C3411 Malignant neoplasm of upper lobe, right bronchus or lung: Secondary | ICD-10-CM | POA: Diagnosis not present

## 2020-01-29 DIAGNOSIS — M7989 Other specified soft tissue disorders: Secondary | ICD-10-CM | POA: Insufficient documentation

## 2020-01-29 DIAGNOSIS — L03119 Cellulitis of unspecified part of limb: Secondary | ICD-10-CM | POA: Diagnosis not present

## 2020-01-29 DIAGNOSIS — Z923 Personal history of irradiation: Secondary | ICD-10-CM | POA: Diagnosis not present

## 2020-01-29 DIAGNOSIS — Z79899 Other long term (current) drug therapy: Secondary | ICD-10-CM | POA: Insufficient documentation

## 2020-01-29 DIAGNOSIS — M79605 Pain in left leg: Secondary | ICD-10-CM | POA: Diagnosis not present

## 2020-01-29 DIAGNOSIS — Z5111 Encounter for antineoplastic chemotherapy: Secondary | ICD-10-CM | POA: Diagnosis not present

## 2020-01-29 DIAGNOSIS — C3431 Malignant neoplasm of lower lobe, right bronchus or lung: Secondary | ICD-10-CM | POA: Insufficient documentation

## 2020-01-29 DIAGNOSIS — M79662 Pain in left lower leg: Secondary | ICD-10-CM | POA: Diagnosis not present

## 2020-01-29 LAB — CBC WITH DIFFERENTIAL (CANCER CENTER ONLY)
Abs Immature Granulocytes: 0.03 10*3/uL (ref 0.00–0.07)
Basophils Absolute: 0.1 10*3/uL (ref 0.0–0.1)
Basophils Relative: 1 %
Eosinophils Absolute: 0.2 10*3/uL (ref 0.0–0.5)
Eosinophils Relative: 4 %
HCT: 52.7 % — ABNORMAL HIGH (ref 36.0–46.0)
Hemoglobin: 17.8 g/dL — ABNORMAL HIGH (ref 12.0–15.0)
Immature Granulocytes: 0 %
Lymphocytes Relative: 10 %
Lymphs Abs: 0.7 10*3/uL (ref 0.7–4.0)
MCH: 33.9 pg (ref 26.0–34.0)
MCHC: 33.8 g/dL (ref 30.0–36.0)
MCV: 100.4 fL — ABNORMAL HIGH (ref 80.0–100.0)
Monocytes Absolute: 0.9 10*3/uL (ref 0.1–1.0)
Monocytes Relative: 12 %
Neutro Abs: 5 10*3/uL (ref 1.7–7.7)
Neutrophils Relative %: 73 %
Platelet Count: 226 10*3/uL (ref 150–400)
RBC: 5.25 MIL/uL — ABNORMAL HIGH (ref 3.87–5.11)
RDW: 13.2 % (ref 11.5–15.5)
WBC Count: 6.8 10*3/uL (ref 4.0–10.5)
nRBC: 0 % (ref 0.0–0.2)

## 2020-01-29 LAB — CMP (CANCER CENTER ONLY)
ALT: 37 U/L (ref 0–44)
AST: 34 U/L (ref 15–41)
Albumin: 3.5 g/dL (ref 3.5–5.0)
Alkaline Phosphatase: 72 U/L (ref 38–126)
Anion gap: 8 (ref 5–15)
BUN: 21 mg/dL — ABNORMAL HIGH (ref 6–20)
CO2: 30 mmol/L (ref 22–32)
Calcium: 9.6 mg/dL (ref 8.9–10.3)
Chloride: 95 mmol/L — ABNORMAL LOW (ref 98–111)
Creatinine: 0.72 mg/dL (ref 0.44–1.00)
GFR, Est AFR Am: >60 mL/min (ref >60)
GFR, Estimated: >60 mL/min (ref >60)
Glucose, Bld: 116 mg/dL — ABNORMAL HIGH (ref 70–99)
Potassium: 4.6 mmol/L (ref 3.5–5.1)
Sodium: 133 mmol/L — ABNORMAL LOW (ref 135–145)
Total Bilirubin: 0.8 mg/dL (ref 0.3–1.2)
Total Protein: 7.9 g/dL (ref 6.5–8.1)

## 2020-01-29 LAB — TSH: TSH: 2.028 u[IU]/mL (ref 0.308–3.960)

## 2020-01-29 MED ORDER — CYANOCOBALAMIN 1000 MCG/ML IJ SOLN
INTRAMUSCULAR | Status: AC
  Filled 2020-01-29: qty 1

## 2020-01-29 MED ORDER — CYANOCOBALAMIN 1000 MCG/ML IJ SOLN
1000.0000 ug | Freq: Once | INTRAMUSCULAR | Status: AC
  Administered 2020-01-29: 1000 ug via INTRAMUSCULAR

## 2020-01-29 MED ORDER — ACETAMINOPHEN 325 MG PO TABS
ORAL_TABLET | ORAL | Status: AC
  Filled 2020-01-29: qty 2

## 2020-01-29 MED ORDER — ACETAMINOPHEN 325 MG PO TABS
650.0000 mg | ORAL_TABLET | Freq: Once | ORAL | Status: AC
  Administered 2020-01-29: 650 mg via ORAL

## 2020-01-29 NOTE — Progress Notes (Signed)
Gina Cervantes OFFICE PROGRESS NOTE   Diagnosis: Non-small cell lung cancer  INTERVAL HISTORY:   Gina Cervantes returns as scheduled.  She is scheduled to begin chemotherapy today.  She returned from a trip to the beach last week.  She reports the onset of mild left knee and lower leg swelling while at the beach.  She has a history of a "Bakers "cyst at the left knee.  The knee became painful and more swollen on 01/24/2020.  She was evaluated in the emergency room on 01/24/2020.  Edema was noted in the left lower leg.  No calf tenderness was noted.  A Doppler was negative for a DVT.  A large cystic structure was found in the left popliteal fossa draining into the proximal calf.  She reports the pain has increased for the past few days.  She is no longer able to ambulate due to pain in the left leg.  No other complaint.  Objective:  Vital signs in last 24 hours:  Blood pressure 112/66, pulse 88, temperature 99.4 F (37.4 C), temperature source Tympanic, resp. rate 16, height '5\' 5"'  (1.651 m), last menstrual period 04/07/2008, SpO2 98 %.    Resp: Lungs clear bilaterally, no respiratory distress Cardio: Regular rate and rhythm Vascular: There is edema at the left knee and lower leg.  There is intense erythema at the medial aspect of the left calf with associated tenderness.  There is venous engorgement over the left lower leg.  No palpable cord.   Lab Results:  Lab Results  Component Value Date   WBC 6.8 01/29/2020   HGB 17.8 (H) 01/29/2020   HCT 52.7 (H) 01/29/2020   MCV 100.4 (H) 01/29/2020   PLT 226 01/29/2020   NEUTROABS 5.0 01/29/2020    CMP  Lab Results  Component Value Date   NA 133 (L) 01/29/2020   K 4.6 01/29/2020   CL 95 (L) 01/29/2020   CO2 30 01/29/2020   GLUCOSE 116 (H) 01/29/2020   BUN 21 (H) 01/29/2020   CREATININE 0.72 01/29/2020   CALCIUM 9.6 01/29/2020   PROT 7.9 01/29/2020   ALBUMIN 3.5 01/29/2020   AST 34 01/29/2020   ALT 37 01/29/2020    ALKPHOS 72 01/29/2020   BILITOT 0.8 01/29/2020   GFRNONAA >60 01/29/2020   GFRAA >60 01/29/2020     Imaging:  VAS [place] LOWER EXTREMITY VENOUS (DVT)  Result Date: 01/29/2020  Lower Venous DVT Study Indications: Pain, Swelling, and Erythema.  Risk Factors: Cancer. Limitations: Poor ultrasound/tissue interface. Comparison Study: 01/24/2020 - L Baker's cyst, Negative for DVT. Performing Technologist: Oliver Hum RVT  Examination Guidelines: A complete evaluation includes B-mode imaging, spectral Doppler, color Doppler, and power Doppler as needed of all accessible portions of each vessel. Bilateral testing is considered an integral part of a complete examination. Limited examinations for reoccurring indications may be performed as noted. The reflux portion of the exam is performed with the patient in reverse Trendelenburg.  +-----+---------------+---------+-----------+----------+--------------+ RIGHTCompressibilityPhasicitySpontaneityPropertiesThrombus Aging +-----+---------------+---------+-----------+----------+--------------+ CFV  Full           Yes      Yes                                 +-----+---------------+---------+-----------+----------+--------------+   +---------+---------------+---------+-----------+----------+--------------+ LEFT     CompressibilityPhasicitySpontaneityPropertiesThrombus Aging +---------+---------------+---------+-----------+----------+--------------+ CFV      Full           Yes      Yes                                 +---------+---------------+---------+-----------+----------+--------------+  SFJ      Full                                                        +---------+---------------+---------+-----------+----------+--------------+ FV Prox  Full                                                        +---------+---------------+---------+-----------+----------+--------------+ FV Mid   Full                                                         +---------+---------------+---------+-----------+----------+--------------+ FV DistalFull                                                        +---------+---------------+---------+-----------+----------+--------------+ PFV      Full                                                        +---------+---------------+---------+-----------+----------+--------------+ POP      Full           Yes      Yes                                 +---------+---------------+---------+-----------+----------+--------------+ PTV      Full                                                        +---------+---------------+---------+-----------+----------+--------------+ PERO     Full                                                        +---------+---------------+---------+-----------+----------+--------------+     Summary: RIGHT: - No evidence of common femoral vein obstruction.  LEFT: - There is no evidence of deep vein thrombosis in the lower extremity. However, portions of this examination were limited- see technologist comments above.  - A cystic structure is found in the popliteal fossa. The structure appears to extend from the popliteal fossa into the mid calf. .  *See table(s) above for measurements and observations.    Preliminary     Medications: I have reviewed the patient's current medications.   Assessment/Plan: 1.Anal cancer-left anal margin/anal canal mass, status post a biopsy on 08/09/2012 confirming invasive moderately differentiated squamous cell carcinoma,p16 positive.  -  Staging PET scan 08/20/2012-hypermetabolic anal mass, no hypermetabolic metastases  -Initiation of concurrent radiation with cycle 1 of mitomycin C./5-fluorouracil 09/02/2012.  -She completed cycle 2 5-fluorouracil/mitomycin C. beginning 10/01/2012.  -She completed radiation 10/10/2012.  2. Right breast cancer 2009, ER positive, PR positive, HER-2 positive. She is status post  neoadjuvant AC x4 cycles followed by Taxol/Herceptin. She underwent a right lumpectomy and sentinel lymph node biopsy 09/22/2008 with no evidence of residual invasive carcinoma and 5 negative sentinel lymph nodes. She then completed right breast radiation. She began tamoxifen 12/15/2008 and completed adjuvant Herceptin 05/25/2009. The tamoxifen was discontinued and she was switched to Arimidex in July 2013. Arimidex discontinued in mid July 2019. 3. History of enlargement of the right tonsil.  4. Nonspecific pulmonary nodules without hypermetabolic activity on the PET scan 08/20/2012. Chest CT 10/21/2012 with scattered pulmonary nodules including nodules that were not present in 2010. Annual surveillance was recommended. Follow-up chest CT 10/24/2013 with scattered groundglass densities again noted in both lungs mostly unchanged when compared to the previous study. A sub-solid right lower lobe nodule had increased central density. The size was unchanged.The nodule has been present since 2009.  CT chest 06/13/2016-a sub-solid 9 mm right upper lobe nodule has enlarged compared to 2015, a right lower lobe irregular nodular density appears more well defined, new vague 5 mm groundglass left upper lobe nodule, left lower lobe 10 mm groundglass nodule is more apparent, posterior left upper lobe circumscribed solid nodule measures 11 x 10 mm and is new  PET scan 06/22/2016-malignant range activity involving the 11 mm left upper lobe nodule, no other hypermetabolic nodules, suspicious sub-solid right lower lobe nodule  Wedge resection of a hypermetabolic left upper lobe nodule on 07/05/2016-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 No EGFR, ALK, BRAF, RET, ERBB2, or ROS1 alteration  CT chest 11/14/2016-status post left upper lobe wedge resection, stable right lung nodules  Chest CT  05/02/2017-stable bilateral solid and groundglass nodules, stable mild mediastinal lymphadenopathy  CT chest 12/13/2017- enlargement of right upper lobe nodule, other lung nodules and chest lymph node stable  PET 01/09/2018- right upper lobe nodule and right lower lobe nodule have enlarged since 2018 and have low level metabolic activity increased size of an 8 mm right upper lobe nodule without increased metabolic activity, stable activity and a mildly enlarged lower paratracheal node, no evidence of metastatic disease in the abdomen or pelvis  CT chest 04/22/2018- no new lung nodules, dominant right lung nodules slightly increased in size  Bronchoscopy/EBUS 05/04/2019 with biopsy of level 7 node, biopsy of right lower lobe nodule, FNA of right lower lobe nodule-negative  CT biopsy of right upper lobe nodule 06/07/2018- adenocarcinoma with lepidic and acinar patterns  SBRT to the dominant right upper lobe nodule 07/23/2018-07/30/2018  CT 09/16/2018-slight increase in size of medial right upper lobe nodule-possibly secondary to interval radiation, other nodules are unchanged  CT 12/25/2018- medial right upper lobe nodule has decreased in size slightly, multiple additional solid and groundglass nodules are stable  CT 06/09/2019-enlarging right lower lobe nodule, enlarging subsolid nodule, new right upper lobe nodule  SBRT to right lower lobe lung nodule, 3 fractions 07/08/2019-07/14/2019  CT 12/05/2019-dominant right lower lobe lesion stable to slightly decreased in size, progressive solid component associated with a dominant right upper lung nodule, mild progression of a left upper lobe nodule, new subsolid anterior medial left upper lobe nodule 5.  Baker's  cyst left leg, pain and swelling in the left calf, negative Doppler 01/24/2020 and 01/29/2020     Disposition: Gina Cervantes has non-small cell lung cancer.  She is scheduled to begin Alimta/carboplatin and pembrolizumab today.  She presents with severe  pain and swelling at the left lower leg.  There is erythema and venous engorgement.  She will be referred for repeat Doppler to rule out a DVT.  The clinical presentation may be related to a ruptured Baker's cyst.  There may be an overlying cellulitis.  Chemotherapy will be held today.  She will be referred to orthopedics for further evaluation.  Gina Cervantes will be scheduled for an office visit and chemotherapy next week.  Betsy Coder, MD  01/29/2020  1:23 PM

## 2020-01-29 NOTE — Progress Notes (Signed)
Left lower extremity venous duplex has been completed. Preliminary results can be found in CV Proc through chart review.  Results were given to Manuela Schwartz at Dr. Gearldine Shown office.  01/29/20 12:53 PM Gina Cervantes RVT

## 2020-01-29 NOTE — Patient Instructions (Signed)
Please provide copy of your Medical Advanced Directive to have scanned into your record. 

## 2020-01-30 ENCOUNTER — Telehealth: Payer: Self-pay | Admitting: Oncology

## 2020-01-30 DIAGNOSIS — M7122 Synovial cyst of popliteal space [Baker], left knee: Secondary | ICD-10-CM | POA: Diagnosis not present

## 2020-01-30 DIAGNOSIS — M25562 Pain in left knee: Secondary | ICD-10-CM | POA: Diagnosis not present

## 2020-01-30 NOTE — Telephone Encounter (Signed)
Scheduled appointments per 9/23 los. Called patient, no answer. Left message for patient with appointments date and times.

## 2020-02-04 ENCOUNTER — Other Ambulatory Visit: Payer: Self-pay | Admitting: *Deleted

## 2020-02-04 ENCOUNTER — Other Ambulatory Visit: Payer: Self-pay

## 2020-02-04 ENCOUNTER — Inpatient Hospital Stay: Payer: BC Managed Care – PPO

## 2020-02-04 ENCOUNTER — Inpatient Hospital Stay (HOSPITAL_BASED_OUTPATIENT_CLINIC_OR_DEPARTMENT_OTHER): Payer: BC Managed Care – PPO | Admitting: Oncology

## 2020-02-04 VITALS — BP 121/72 | HR 75 | Temp 98.2°F | Resp 20 | Ht 65.0 in | Wt 147.6 lb

## 2020-02-04 DIAGNOSIS — M7989 Other specified soft tissue disorders: Secondary | ICD-10-CM | POA: Diagnosis not present

## 2020-02-04 DIAGNOSIS — C3431 Malignant neoplasm of lower lobe, right bronchus or lung: Secondary | ICD-10-CM | POA: Diagnosis not present

## 2020-02-04 DIAGNOSIS — Z5111 Encounter for antineoplastic chemotherapy: Secondary | ICD-10-CM | POA: Diagnosis not present

## 2020-02-04 DIAGNOSIS — C3412 Malignant neoplasm of upper lobe, left bronchus or lung: Secondary | ICD-10-CM

## 2020-02-04 DIAGNOSIS — C3411 Malignant neoplasm of upper lobe, right bronchus or lung: Secondary | ICD-10-CM | POA: Diagnosis not present

## 2020-02-04 DIAGNOSIS — Z79899 Other long term (current) drug therapy: Secondary | ICD-10-CM | POA: Diagnosis not present

## 2020-02-04 DIAGNOSIS — Z923 Personal history of irradiation: Secondary | ICD-10-CM | POA: Diagnosis not present

## 2020-02-04 MED ORDER — SODIUM CHLORIDE 0.9 % IV SOLN
520.0000 mg | Freq: Once | INTRAVENOUS | Status: AC
  Administered 2020-02-04: 520 mg via INTRAVENOUS
  Filled 2020-02-04: qty 52

## 2020-02-04 MED ORDER — PALONOSETRON HCL INJECTION 0.25 MG/5ML
0.2500 mg | Freq: Once | INTRAVENOUS | Status: AC
  Administered 2020-02-04: 0.25 mg via INTRAVENOUS

## 2020-02-04 MED ORDER — PALONOSETRON HCL INJECTION 0.25 MG/5ML
INTRAVENOUS | Status: AC
  Filled 2020-02-04: qty 5

## 2020-02-04 MED ORDER — SODIUM CHLORIDE 0.9 % IV SOLN
200.0000 mg | Freq: Once | INTRAVENOUS | Status: AC
  Administered 2020-02-04: 200 mg via INTRAVENOUS
  Filled 2020-02-04: qty 8

## 2020-02-04 MED ORDER — SODIUM CHLORIDE 0.9 % IV SOLN
Freq: Once | INTRAVENOUS | Status: AC
  Filled 2020-02-04: qty 250

## 2020-02-04 MED ORDER — PROCHLORPERAZINE MALEATE 10 MG PO TABS: 10.0000 mg | ORAL_TABLET | Freq: Four times a day (QID) | ORAL | 1 refills | Status: DC | PRN

## 2020-02-04 MED ORDER — SODIUM CHLORIDE 0.9 % IV SOLN
150.0000 mg | Freq: Once | INTRAVENOUS | Status: AC
  Administered 2020-02-04: 150 mg via INTRAVENOUS
  Filled 2020-02-04: qty 150

## 2020-02-04 MED ORDER — SODIUM CHLORIDE 0.9 % IV SOLN
500.0000 mg/m2 | Freq: Once | INTRAVENOUS | Status: AC
  Administered 2020-02-04: 900 mg via INTRAVENOUS
  Filled 2020-02-04: qty 20

## 2020-02-04 MED ORDER — SODIUM CHLORIDE 0.9 % IV SOLN
10.0000 mg | Freq: Once | INTRAVENOUS | Status: AC
  Administered 2020-02-04: 10 mg via INTRAVENOUS
  Filled 2020-02-04: qty 10

## 2020-02-04 NOTE — Progress Notes (Signed)
[place] OFFICE PROGRESS NOTE   Diagnosis: Non-small cell lung cancer  INTERVAL HISTORY:   Gina Cervantes returns for a scheduled visit.  She had swelling, pain, and erythema of the left lower leg when she was here on 01/29/2020.  A repeat Doppler was negative for DVT and revealed a cystic structure in the popliteal fossa extending into the mid calf.  She was referred to Dr. Tonita Cong.  A ruptured cyst was confirmed.  The cyst was aspirated and she was placed on Keflex.  She reports marked improvement in the left leg pain.  Swelling has improved.  She is now ambulating.  Objective:  Vital signs in last 24 hours:  Blood pressure 121/72, pulse 75, temperature 98.2 F (36.8 C), resp. rate 20, height '5\' 5"'  (1.651 m), weight 147 lb 9.6 oz (67 kg), last menstrual period 04/07/2008, SpO2 94 %.    Resp: Lungs clear bilaterally Cardio: Regular rate and rhythm Vascular: 1+ edema at the left leg below the knee, mild erythema at the left posterior medial calf      Lab Results:  Lab Results  Component Value Date   WBC 6.8 01/29/2020   HGB 17.8 (H) 01/29/2020   HCT 52.7 (H) 01/29/2020   MCV 100.4 (H) 01/29/2020   PLT 226 01/29/2020   NEUTROABS 5.0 01/29/2020    CMP  Lab Results  Component Value Date   NA 133 (L) 01/29/2020   K 4.6 01/29/2020   CL 95 (L) 01/29/2020   CO2 30 01/29/2020   GLUCOSE 116 (H) 01/29/2020   BUN 21 (H) 01/29/2020   CREATININE 0.72 01/29/2020   CALCIUM 9.6 01/29/2020   PROT 7.9 01/29/2020   ALBUMIN 3.5 01/29/2020   AST 34 01/29/2020   ALT 37 01/29/2020   ALKPHOS 72 01/29/2020   BILITOT 0.8 01/29/2020   GFRNONAA >60 01/29/2020   GFRAA >60 01/29/2020    Medications: I have reviewed the patient's current medications.   Assessment/Plan: 1.Anal cancer-left anal margin/anal canal mass, status post a biopsy on 08/09/2012 confirming invasive moderately differentiated squamous cell carcinoma,p16 positive.  -Staging PET scan  08/20/2012-hypermetabolic anal mass, no hypermetabolic metastases  -Initiation of concurrent radiation with cycle 1 of mitomycin C./5-fluorouracil 09/02/2012.  -She completed cycle 2 5-fluorouracil/mitomycin C. beginning 10/01/2012.  -She completed radiation 10/10/2012.  2. Right breast cancer 2009, ER positive, PR positive, HER-2 positive. She is status post neoadjuvant AC x4 cycles followed by Taxol/Herceptin. She underwent a right lumpectomy and sentinel lymph node biopsy 09/22/2008 with no evidence of residual invasive carcinoma and 5 negative sentinel lymph nodes. She then completed right breast radiation. She began tamoxifen 12/15/2008 and completed adjuvant Herceptin 05/25/2009. The tamoxifen was discontinued and she was switched to Arimidex in July 2013. Arimidex discontinued in mid July 2019. 3. History of enlargement of the right tonsil.  4. Nonspecific pulmonary nodules without hypermetabolic activity on the PET scan 08/20/2012. Chest CT 10/21/2012 with scattered pulmonary nodules including nodules that were not present in 2010. Annual surveillance was recommended. Follow-up chest CT 10/24/2013 with scattered groundglass densities again noted in both lungs mostly unchanged when compared to the previous study. A sub-solid right lower lobe nodule had increased central density. The size was unchanged.The nodule has been present since 2009.  CT chest 06/13/2016-a sub-solid 9 mm right upper lobe nodule has enlarged compared to 2015, a right lower lobe irregular nodular density appears more well defined, new vague 5 mm groundglass left upper lobe nodule, left lower lobe 10 mm groundglass nodule is  more apparent, posterior left upper lobe circumscribed solid nodule measures 11 x 10 mm and is new  PET scan 06/22/2016-malignant range activity involving the 11 mm left upper lobe nodule, no other hypermetabolic nodules, suspicious sub-solid right lower lobe nodule  Wedge resection of a  hypermetabolic left upper lobe nodule on 07/05/2016-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 No EGFR, ALK, BRAF, RET, ERBB2, or ROS1 alteration  CT chest 11/14/2016-status post left upper lobe wedge resection, stable right lung nodules  Chest CT 05/02/2017-stable bilateral solid and groundglass nodules, stable mild mediastinal lymphadenopathy  CT chest 12/13/2017- enlargement of right upper lobe nodule, other lung nodules and chest lymph node stable  PET 01/09/2018- right upper lobe nodule and right lower lobe nodule have enlarged since 2018 and have low level metabolic activity increased size of an 8 mm right upper lobe nodule without increased metabolic activity, stable activity and a mildly enlarged lower paratracheal node, no evidence of metastatic disease in the abdomen or pelvis  CT chest 04/22/2018- no new lung nodules, dominant right lung nodules slightly increased in size  Bronchoscopy/EBUS 05/04/2019 with biopsy of level 7 node, biopsy of right lower lobe nodule, FNA of right lower lobe nodule-negative  CT biopsy of right upper lobe nodule 06/07/2018- adenocarcinoma with lepidic and acinar patterns  SBRT to the dominant right upper lobe nodule 07/23/2018-07/30/2018  CT 09/16/2018-slight increase in size of medial right upper lobe nodule-possibly secondary to interval radiation, other nodules are unchanged  CT 12/25/2018- medial right upper lobe nodule has decreased in size slightly, multiple additional solid and groundglass nodules are stable  CT 06/09/2019-enlarging right lower lobe nodule, enlarging subsolid nodule, new right upper lobe nodule  SBRT to right lower lobe lung nodule, 3 fractions 07/08/2019-07/14/2019  CT 12/05/2019-dominant right lower lobe lesion stable to slightly decreased in size, progressive solid component associated with a dominant right upper  lung nodule, mild progression of a left upper lobe nodule, new subsolid anterior medial left upper lobe nodule  Cycle 1 Alimta/carboplatin/pembrolizumab 02/04/2020 5.  Baker's cyst left leg, pain and swelling in the left calf, negative Doppler 01/24/2020 and 01/29/2020-evaluated by orthopedics and underwent aspiration of the cyst 01/29/2000, completed a course of Keflex      Disposition: Gina Cervantes has multifocal non-small cell lung cancer.  She will complete cycle 1 Alimta/carboplatin/pembrolizumab today.  She will return for an office visit and chemotherapy in 3 weeks.  She has been diagnosed with a ruptured Baker's cyst.  The left leg swelling is partially improved following aspiration of the cyst.  Left leg pain is markedly improved.  She will complete the course of Keflex.  She is scheduled for follow-up with Dr. Tonita Cong later this week.    Betsy Coder, MD  02/04/2020  9:06 AM

## 2020-02-04 NOTE — Patient Instructions (Signed)
[facility] Discharge Instructions for Patients Receiving Chemotherapy  Today you received the following chemotherapy agents Pembrolizumab (KEYTRUDA), Pemetrexed (ALIMTA) & Carboplatin (PARAPLATIN).  To help prevent nausea and vomiting after your treatment, we encourage you to take your nausea medication {as prescribed.   If you develop nausea and vomiting that is not controlled by your nausea medication, call the clinic.   BELOW ARE SYMPTOMS THAT SHOULD BE REPORTED IMMEDIATELY:  *FEVER GREATER THAN 100.5 F  *CHILLS WITH OR WITHOUT FEVER  NAUSEA AND VOMITING THAT IS NOT CONTROLLED WITH YOUR NAUSEA MEDICATION  *UNUSUAL SHORTNESS OF BREATH  *UNUSUAL BRUISING OR BLEEDING  TENDERNESS IN MOUTH AND THROAT WITH OR WITHOUT PRESENCE OF ULCERS  *URINARY PROBLEMS  *BOWEL PROBLEMS  UNUSUAL RASH Items with * indicate a potential emergency and should be followed up as soon as possible.  Feel free to call the clinic should you have any questions or concerns. The clinic phone number is [phone].  Please show the [facility] at check-in to the Emergency Department and triage nurse.  Pembrolizumab injection What is this medicine? PEMBROLIZUMAB (pem broe liz ue mab) is a monoclonal antibody. It is used to treat certain types of cancer. This medicine may be used for other purposes; ask your health care provider or pharmacist if you have questions. COMMON BRAND NAME(S): Keytruda What should I tell my health care provider before I take this medicine? They need to know if you have any of these conditions:  diabetes  immune system problems  inflammatory bowel disease  liver disease  lung or breathing disease  lupus  received or scheduled to receive an organ transplant or a stem-cell transplant that uses donor stem cells  an unusual or allergic reaction to pembrolizumab, other medicines, foods, dyes, or preservatives  pregnant or trying to get  pregnant  breast-feeding How should I use this medicine? This medicine is for infusion into a vein. It is given by a health care professional in a hospital or clinic setting. A special MedGuide will be given to you before each treatment. Be sure to read this information carefully each time. Talk to your pediatrician regarding the use of this medicine in children. While this drug may be prescribed for children as young as 6 months for selected conditions, precautions do apply. Overdosage: If you think you have taken too much of this medicine contact a poison control center or emergency room at once. NOTE: This medicine is only for you. Do not share this medicine with others. What if I miss a dose? It is important not to miss your dose. Call your doctor or health care professional if you are unable to keep an appointment. What may interact with this medicine? Interactions have not been studied. Give your health care provider a list of all the medicines, herbs, non-prescription drugs, or dietary supplements you use. Also tell them if you smoke, drink alcohol, or use illegal drugs. Some items may interact with your medicine. This list may not describe all possible interactions. Give your health care provider a list of all the medicines, herbs, non-prescription drugs, or dietary supplements you use. Also tell them if you smoke, drink alcohol, or use illegal drugs. Some items may interact with your medicine. What should I watch for while using this medicine? Your condition will be monitored carefully while you are receiving this medicine. You may need blood work done while you are taking this medicine. Do not become pregnant while taking this medicine or for 4  months after stopping it. Women should inform their doctor if they wish to become pregnant or think they might be pregnant. There is a potential for serious side effects to an unborn child. Talk to your health care professional or pharmacist for  more information. Do not breast-feed an infant while taking this medicine or for 4 months after the last dose. What side effects may I notice from receiving this medicine? Side effects that you should report to your doctor or health care professional as soon as possible:  allergic reactions like skin rash, itching or hives, swelling of the face, lips, or tongue  bloody or black, tarry  breathing problems  changes in vision  chest pain  chills  confusion  constipation  cough  diarrhea  dizziness or feeling faint or lightheaded  fast or irregular heartbeat  fever  flushing  joint pain  low blood counts - this medicine may decrease the number of white blood cells, red blood cells and platelets. You may be at increased risk for infections and bleeding.  muscle pain  muscle weakness  pain, tingling, numbness in the hands or feet  persistent headache  redness, blistering, peeling or loosening of the skin, including inside the mouth  signs and symptoms of high blood sugar such as dizziness; dry mouth; dry skin; fruity breath; nausea; stomach pain; increased hunger or thirst; increased urination  signs and symptoms of kidney injury like trouble passing urine or change in the amount of urine  signs and symptoms of liver injury like dark urine, light-colored stools, loss of appetite, nausea, right upper belly pain, yellowing of the eyes or skin  sweating  swollen lymph nodes  weight loss Side effects that usually do not require medical attention (report to your doctor or health care professional if they continue or are bothersome):  decreased appetite  hair loss  muscle pain  tiredness This list may not describe all possible side effects. Call your doctor for medical advice about side effects. You may report side effects to FDA at 1-800-FDA-1088. Where should I keep my medicine? This drug is given in a hospital or clinic and will not be stored at home. NOTE:  This sheet is a summary. It may not cover all possible information. If you have questions about this medicine, talk to your doctor, pharmacist, or health care provider.  2020 Elsevier/Gold Standard (2019-02-28 18:07:58)  Pemetrexed injection What is this medicine? PEMETREXED (PEM e TREX ed) is a chemotherapy drug used to treat lung cancers like non-small cell lung cancer and mesothelioma. It may also be used to treat other cancers. This medicine may be used for other purposes; ask your health care provider or pharmacist if you have questions. COMMON BRAND NAME(S): Alimta What should I tell my health care provider before I take this medicine? They need to know if you have any of these conditions:  infection (especially a virus infection such as chickenpox, cold sores, or herpes)  kidney disease  low blood counts, like low white cell, platelet, or red cell counts  lung or breathing disease, like asthma  radiation therapy  an unusual or allergic reaction to pemetrexed, other medicines, foods, dyes, or preservative  pregnant or trying to get pregnant  breast-feeding How should I use this medicine? This drug is given as an infusion into a vein. It is administered in a hospital or clinic by a specially trained health care professional. Talk to your pediatrician regarding the use of this medicine in children. Special care may  be needed. Overdosage: If you think you have taken too much of this medicine contact a poison control center or emergency room at once. NOTE: This medicine is only for you. Do not share this medicine with others. What if I miss a dose? It is important not to miss your dose. Call your doctor or health care professional if you are unable to keep an appointment. What may interact with this medicine? This medicine may interact with the following medications:  Ibuprofen This list may not describe all possible interactions. Give your health care provider a list of all  the medicines, herbs, non-prescription drugs, or dietary supplements you use. Also tell them if you smoke, drink alcohol, or use illegal drugs. Some items may interact with your medicine. What should I watch for while using this medicine? Visit your doctor for checks on your progress. This drug may make you feel generally unwell. This is not uncommon, as chemotherapy can affect healthy cells as well as cancer cells. Report any side effects. Continue your course of treatment even though you feel ill unless your doctor tells you to stop. In some cases, you may be given additional medicines to help with side effects. Follow all directions for their use. Call your doctor or health care professional for advice if you get a fever, chills or sore throat, or other symptoms of a cold or flu. Do not treat yourself. This drug decreases your body's ability to fight infections. Try to avoid being around people who are sick. This medicine may increase your risk to bruise or bleed. Call your doctor or health care professional if you notice any unusual bleeding. Be careful brushing and flossing your teeth or using a toothpick because you may get an infection or bleed more easily. If you have any dental work done, tell your dentist you are receiving this medicine. Avoid taking products that contain aspirin, acetaminophen, ibuprofen, naproxen, or ketoprofen unless instructed by your doctor. These medicines may hide a fever. Call your doctor or health care professional if you get diarrhea or mouth sores. Do not treat yourself. To protect your kidneys, drink water or other fluids as directed while you are taking this medicine. Do not become pregnant while taking this medicine or for 6 months after stopping it. Women should inform their doctor if they wish to become pregnant or think they might be pregnant. Men should not father a child while taking this medicine and for 3 months after stopping it. This may interfere with the  ability to father a child. You should talk to your doctor or health care professional if you are concerned about your fertility. There is a potential for serious side effects to an unborn child. Talk to your health care professional or pharmacist for more information. Do not breast-feed an infant while taking this medicine or for 1 week after stopping it. What side effects may I notice from receiving this medicine? Side effects that you should report to your doctor or health care professional as soon as possible:  allergic reactions like skin rash, itching or hives, swelling of the face, lips, or tongue  breathing problems  redness, blistering, peeling or loosening of the skin, including inside the mouth  signs and symptoms of bleeding such as bloody or black, tarry stools; red or dark-brown urine; spitting up blood or brown material that looks like coffee grounds; red spots on the skin; unusual bruising or bleeding from the eye, gums, or nose  signs and symptoms of infection like  fever or chills; cough; sore throat; pain or trouble passing urine  signs and symptoms of kidney injury like trouble passing urine or change in the amount of urine  signs and symptoms of liver injury like dark yellow or brown urine; general ill feeling or flu-like symptoms; light-colored stools; loss of appetite; nausea; right upper belly pain; unusually weak or tired; yellowing of the eyes or skin Side effects that usually do not require medical attention (report to your doctor or health care professional if they continue or are bothersome):  constipation  mouth sores  nausea, vomiting  unusually weak or tired This list may not describe all possible side effects. Call your doctor for medical advice about side effects. You may report side effects to FDA at 1-800-FDA-1088. Where should I keep my medicine? This drug is given in a hospital or clinic and will not be stored at home. NOTE: This sheet is a summary. It  may not cover all possible information. If you have questions about this medicine, talk to your doctor, pharmacist, or health care provider.  2020 Elsevier/Gold Standard (2017-06-13 16:11:33)  Carboplatin injection What is this medicine? CARBOPLATIN (KAR boe pla tin) is a chemotherapy drug. It targets fast dividing cells, like cancer cells, and causes these cells to die. This medicine is used to treat ovarian cancer and many other cancers. This medicine may be used for other purposes; ask your health care provider or pharmacist if you have questions. COMMON BRAND NAME(S): Paraplatin What should I tell my health care provider before I take this medicine? They need to know if you have any of these conditions:  blood disorders  hearing problems  kidney disease  recent or ongoing radiation therapy  an unusual or allergic reaction to carboplatin, cisplatin, other chemotherapy, other medicines, foods, dyes, or preservatives  pregnant or trying to get pregnant  breast-feeding How should I use this medicine? This drug is usually given as an infusion into a vein. It is administered in a hospital or clinic by a specially trained health care professional. Talk to your pediatrician regarding the use of this medicine in children. Special care may be needed. Overdosage: If you think you have taken too much of this medicine contact a poison control center or emergency room at once. NOTE: This medicine is only for you. Do not share this medicine with others. What if I miss a dose? It is important not to miss a dose. Call your doctor or health care professional if you are unable to keep an appointment. What may interact with this medicine?  medicines for seizures  medicines to increase blood counts like filgrastim, pegfilgrastim, sargramostim  some antibiotics like amikacin, gentamicin, neomycin, streptomycin, tobramycin  vaccines Talk to your doctor or health care professional before taking  any of these medicines:  acetaminophen  aspirin  ibuprofen  ketoprofen  naproxen This list may not describe all possible interactions. Give your health care provider a list of all the medicines, herbs, non-prescription drugs, or dietary supplements you use. Also tell them if you smoke, drink alcohol, or use illegal drugs. Some items may interact with your medicine. What should I watch for while using this medicine? Your condition will be monitored carefully while you are receiving this medicine. You will need important blood work done while you are taking this medicine. This drug may make you feel generally unwell. This is not uncommon, as chemotherapy can affect healthy cells as well as cancer cells. Report any side effects. Continue your course of  treatment even though you feel ill unless your doctor tells you to stop. In some cases, you may be given additional medicines to help with side effects. Follow all directions for their use. Call your doctor or health care professional for advice if you get a fever, chills or sore throat, or other symptoms of a cold or flu. Do not treat yourself. This drug decreases your body's ability to fight infections. Try to avoid being around people who are sick. This medicine may increase your risk to bruise or bleed. Call your doctor or health care professional if you notice any unusual bleeding. Be careful brushing and flossing your teeth or using a toothpick because you may get an infection or bleed more easily. If you have any dental work done, tell your dentist you are receiving this medicine. Avoid taking products that contain aspirin, acetaminophen, ibuprofen, naproxen, or ketoprofen unless instructed by your doctor. These medicines may hide a fever. Do not become pregnant while taking this medicine. Women should inform their doctor if they wish to become pregnant or think they might be pregnant. There is a potential for serious side effects to an unborn  child. Talk to your health care professional or pharmacist for more information. Do not breast-feed an infant while taking this medicine. What side effects may I notice from receiving this medicine? Side effects that you should report to your doctor or health care professional as soon as possible:  allergic reactions like skin rash, itching or hives, swelling of the face, lips, or tongue  signs of infection - fever or chills, cough, sore throat, pain or difficulty passing urine  signs of decreased platelets or bleeding - bruising, pinpoint red spots on the skin, black, tarry stools, nosebleeds  signs of decreased red blood cells - unusually weak or tired, fainting spells, lightheadedness  breathing problems  changes in hearing  changes in vision  chest pain  high blood pressure  low blood counts - This drug may decrease the number of white blood cells, red blood cells and platelets. You may be at increased risk for infections and bleeding.  nausea and vomiting  pain, swelling, redness or irritation at the injection site  pain, tingling, numbness in the hands or feet  problems with balance, talking, walking  trouble passing urine or change in the amount of urine Side effects that usually do not require medical attention (report to your doctor or health care professional if they continue or are bothersome):  hair loss  loss of appetite  metallic taste in the mouth or changes in taste This list may not describe all possible side effects. Call your doctor for medical advice about side effects. You may report side effects to FDA at 1-800-FDA-1088. Where should I keep my medicine? This drug is given in a hospital or clinic and will not be stored at home. NOTE: This sheet is a summary. It may not cover all possible information. If you have questions about this medicine, talk to your doctor, pharmacist, or health care provider.  2020 Elsevier/Gold Standard (2007-07-30  14:38:05)

## 2020-02-05 ENCOUNTER — Telehealth: Payer: Self-pay | Admitting: *Deleted

## 2020-02-05 ENCOUNTER — Telehealth: Payer: Self-pay | Admitting: Oncology

## 2020-02-05 NOTE — Telephone Encounter (Signed)
Scheduled appointments per 9/29 los. Spoke to patient who is aware of appointments date and times.

## 2020-02-05 NOTE — Telephone Encounter (Signed)
Called pt to see how she did with her treatment yest & she reports no problems.  She does know what to look for & how to reach [place] if she has problems.

## 2020-02-05 NOTE — Telephone Encounter (Signed)
-----   Message from Georgianne Fick, RN sent at 02/04/2020 12:31 PM EDT ----- Regarding: Dr. Benay Spice First Time Chemo Patient received first time Pemetrexed, Pembrolizumab and Carboplatin today and tolerated these well.

## 2020-02-06 DIAGNOSIS — M7122 Synovial cyst of popliteal space [Baker], left knee: Secondary | ICD-10-CM | POA: Diagnosis not present

## 2020-02-06 DIAGNOSIS — M25562 Pain in left knee: Secondary | ICD-10-CM | POA: Diagnosis not present

## 2020-02-06 DIAGNOSIS — M79605 Pain in left leg: Secondary | ICD-10-CM | POA: Diagnosis not present

## 2020-02-10 ENCOUNTER — Ambulatory Visit: Payer: BC Managed Care – PPO | Admitting: Obstetrics & Gynecology

## 2020-02-16 ENCOUNTER — Other Ambulatory Visit: Payer: Self-pay

## 2020-02-16 ENCOUNTER — Ambulatory Visit
Admission: RE | Admit: 2020-02-16 | Discharge: 2020-02-16 | Disposition: A | Discharge status: Home / Self Care | Payer: BC Managed Care – PPO | Source: Ambulatory Visit | Attending: Oncology | Admitting: Oncology

## 2020-02-16 DIAGNOSIS — Z1231 Encounter for screening mammogram for malignant neoplasm of breast: Secondary | ICD-10-CM | POA: Diagnosis not present

## 2020-02-20 ENCOUNTER — Other Ambulatory Visit: Payer: Self-pay | Admitting: Oncology

## 2020-02-23 ENCOUNTER — Other Ambulatory Visit: Payer: Self-pay

## 2020-02-23 ENCOUNTER — Inpatient Hospital Stay: Payer: BC Managed Care – PPO

## 2020-02-23 ENCOUNTER — Inpatient Hospital Stay: Payer: BC Managed Care – PPO | Attending: Oncology

## 2020-02-23 ENCOUNTER — Inpatient Hospital Stay (HOSPITAL_BASED_OUTPATIENT_CLINIC_OR_DEPARTMENT_OTHER): Payer: BC Managed Care – PPO | Admitting: Nurse Practitioner

## 2020-02-23 ENCOUNTER — Encounter: Payer: Self-pay | Admitting: Nurse Practitioner

## 2020-02-23 VITALS — BP 105/56 | HR 90 | Temp 98.2°F | Resp 20 | Ht 65.0 in | Wt 150.5 lb

## 2020-02-23 DIAGNOSIS — C3412 Malignant neoplasm of upper lobe, left bronchus or lung: Secondary | ICD-10-CM

## 2020-02-23 DIAGNOSIS — Z79899 Other long term (current) drug therapy: Secondary | ICD-10-CM | POA: Diagnosis not present

## 2020-02-23 DIAGNOSIS — C3411 Malignant neoplasm of upper lobe, right bronchus or lung: Secondary | ICD-10-CM | POA: Insufficient documentation

## 2020-02-23 DIAGNOSIS — Z853 Personal history of malignant neoplasm of breast: Secondary | ICD-10-CM | POA: Diagnosis not present

## 2020-02-23 DIAGNOSIS — Z5111 Encounter for antineoplastic chemotherapy: Secondary | ICD-10-CM | POA: Insufficient documentation

## 2020-02-23 DIAGNOSIS — Z170 Estrogen receptor positive status [ER+]: Secondary | ICD-10-CM | POA: Insufficient documentation

## 2020-02-23 DIAGNOSIS — C3431 Malignant neoplasm of lower lobe, right bronchus or lung: Secondary | ICD-10-CM | POA: Insufficient documentation

## 2020-02-23 DIAGNOSIS — D490 Neoplasm of unspecified behavior of digestive system: Secondary | ICD-10-CM | POA: Diagnosis not present

## 2020-02-23 DIAGNOSIS — Z923 Personal history of irradiation: Secondary | ICD-10-CM | POA: Diagnosis not present

## 2020-02-23 LAB — CBC WITH DIFFERENTIAL (CANCER CENTER ONLY)
Abs Immature Granulocytes: 0.01 10*3/uL (ref 0.00–0.07)
Basophils Absolute: 0 10*3/uL (ref 0.0–0.1)
Basophils Relative: 1 %
Eosinophils Absolute: 0.1 10*3/uL (ref 0.0–0.5)
Eosinophils Relative: 3 %
HCT: 39.8 % (ref 36.0–46.0)
Hemoglobin: 13.6 g/dL (ref 12.0–15.0)
Immature Granulocytes: 0 %
Lymphocytes Relative: 23 %
Lymphs Abs: 0.8 10*3/uL (ref 0.7–4.0)
MCH: 32.9 pg (ref 26.0–34.0)
MCHC: 34.2 g/dL (ref 30.0–36.0)
MCV: 96.1 fL (ref 80.0–100.0)
Monocytes Absolute: 0.5 10*3/uL (ref 0.1–1.0)
Monocytes Relative: 13 %
Neutro Abs: 2 10*3/uL (ref 1.7–7.7)
Neutrophils Relative %: 60 %
Platelet Count: 256 10*3/uL (ref 150–400)
RBC: 4.14 MIL/uL (ref 3.87–5.11)
RDW: 13.3 % (ref 11.5–15.5)
WBC Count: 3.4 10*3/uL — ABNORMAL LOW (ref 4.0–10.5)
nRBC: 0 % (ref 0.0–0.2)

## 2020-02-23 LAB — CMP (CANCER CENTER ONLY)
ALT: 30 U/L (ref 0–44)
AST: 31 U/L (ref 15–41)
Albumin: 3.5 g/dL (ref 3.5–5.0)
Alkaline Phosphatase: 68 U/L (ref 38–126)
Anion gap: 8 (ref 5–15)
BUN: 18 mg/dL (ref 6–20)
CO2: 32 mmol/L (ref 22–32)
Calcium: 9.3 mg/dL (ref 8.9–10.3)
Chloride: 99 mmol/L (ref 98–111)
Creatinine: 0.71 mg/dL (ref 0.44–1.00)
GFR, Estimated: >60 mL/min (ref >60)
Glucose, Bld: 107 mg/dL — ABNORMAL HIGH (ref 70–99)
Potassium: 4.1 mmol/L (ref 3.5–5.1)
Sodium: 139 mmol/L (ref 135–145)
Total Bilirubin: 0.6 mg/dL (ref 0.3–1.2)
Total Protein: 7.2 g/dL (ref 6.5–8.1)

## 2020-02-23 MED ORDER — SODIUM CHLORIDE 0.9 % IV SOLN
10.0000 mg | Freq: Once | INTRAVENOUS | Status: AC
  Administered 2020-02-23: 10 mg via INTRAVENOUS
  Filled 2020-02-23: qty 10

## 2020-02-23 MED ORDER — SODIUM CHLORIDE 0.9 % IV SOLN
200.0000 mg | Freq: Once | INTRAVENOUS | Status: AC
  Administered 2020-02-23: 200 mg via INTRAVENOUS
  Filled 2020-02-23: qty 8

## 2020-02-23 MED ORDER — PALONOSETRON HCL INJECTION 0.25 MG/5ML
0.2500 mg | Freq: Once | INTRAVENOUS | Status: AC
  Administered 2020-02-23: 0.25 mg via INTRAVENOUS

## 2020-02-23 MED ORDER — SODIUM CHLORIDE 0.9 % IV SOLN
Freq: Once | INTRAVENOUS | Status: AC
  Filled 2020-02-23: qty 250

## 2020-02-23 MED ORDER — PALONOSETRON HCL INJECTION 0.25 MG/5ML
INTRAVENOUS | Status: AC
  Filled 2020-02-23: qty 5

## 2020-02-23 MED ORDER — LORAZEPAM 0.5 MG PO TABS: 0.5000 mg | ORAL_TABLET | Freq: Three times a day (TID) | ORAL | 0 refills | Status: DC | PRN

## 2020-02-23 MED ORDER — SODIUM CHLORIDE 0.9 % IV SOLN
150.0000 mg | Freq: Once | INTRAVENOUS | Status: AC
  Administered 2020-02-23: 150 mg via INTRAVENOUS
  Filled 2020-02-23: qty 150

## 2020-02-23 MED ORDER — SODIUM CHLORIDE 0.9 % IV SOLN
520.0000 mg | Freq: Once | INTRAVENOUS | Status: AC
  Administered 2020-02-23: 520 mg via INTRAVENOUS
  Filled 2020-02-23: qty 52

## 2020-02-23 MED ORDER — SODIUM CHLORIDE 0.9 % IV SOLN
500.0000 mg/m2 | Freq: Once | INTRAVENOUS | Status: AC
  Administered 2020-02-23: 900 mg via INTRAVENOUS
  Filled 2020-02-23: qty 36

## 2020-02-23 NOTE — Progress Notes (Addendum)
[place] OFFICE PROGRESS NOTE   Diagnosis: Non-small cell lung cancer  INTERVAL HISTORY:   Gina Cervantes returns as scheduled.  She completed cycle 1 Alimta/carboplatin/pembrolizumab 02/04/2020.  She began having nausea around day 4.  The nausea lasted for 5 days.  She was able to eat and drink without difficulty.  No vomiting.  She took Zofran and Compazine as needed.  She also developed significant constipation which she felt was due to Zofran.  She had a few nosebleeds.  No mouth sores.  No diarrhea.  No rash.  She denies fever, cough, shortness of breath.  Left leg edema is better.  Objective:  Vital signs in last 24 hours:  Blood pressure (!) 105/56, pulse 90, temperature 98.2 F (36.8 C), temperature source Tympanic, resp. rate 20, height '5\' 5"'  (1.651 m), weight 150 lb 8 oz (68.3 kg), last menstrual period 04/07/2008, SpO2 98 %.    HEENT: No thrush or ulcers. Resp: Lungs clear bilaterally. Cardio: Regular rate and rhythm. GI: Abdomen soft and nontender.  No hepatomegaly. Vascular: Trace edema left leg below the knee.  Skin: No rash.   Lab Results:  Lab Results  Component Value Date   WBC 3.4 (L) 02/23/2020   HGB 13.6 02/23/2020   HCT 39.8 02/23/2020   MCV 96.1 02/23/2020   PLT 256 02/23/2020   NEUTROABS 2.0 02/23/2020    Imaging:  No results found.  Medications: I have reviewed the patient's current medications.  Assessment/Plan: 1.Anal cancer-left anal margin/anal canal mass, status post a biopsy on 08/09/2012 confirming invasive moderately differentiated squamous cell carcinoma,p16 positive.  -Staging PET scan 08/20/2012-hypermetabolic anal mass, no hypermetabolic metastases  -Initiation of concurrent radiation with cycle 1 of mitomycin C./5-fluorouracil 09/02/2012.  -She completed cycle 2 5-fluorouracil/mitomycin C. beginning 10/01/2012.  -She completed radiation 10/10/2012.  2. Right breast cancer 2009, ER positive, PR positive, HER-2  positive. She is status post neoadjuvant AC x4 cycles followed by Taxol/Herceptin. She underwent a right lumpectomy and sentinel lymph node biopsy 09/22/2008 with no evidence of residual invasive carcinoma and 5 negative sentinel lymph nodes. She then completed right breast radiation. She began tamoxifen 12/15/2008 and completed adjuvant Herceptin 05/25/2009. The tamoxifen was discontinued and she was switched to Arimidex in July 2013. Arimidex discontinuedin mid July 2019. 3. History of enlargement of the right tonsil.  4. Nonspecific pulmonary nodules without hypermetabolic activity on the PET scan 08/20/2012. Chest CT 10/21/2012 with scattered pulmonary nodules including nodules that were not present in 2010. Annual surveillance was recommended. Follow-up chest CT 10/24/2013 with scattered groundglass densities again noted in both lungs mostly unchanged when compared to the previous study. A sub-solid right lower lobe nodule had increased central density. The size was unchanged.The nodule has been present since 2009.  CT chest 06/13/2016-a sub-solid 9 mm right upper lobe nodule has enlarged compared to 2015, a right lower lobe irregular nodular density appears more well defined, new vague 5 mm groundglass left upper lobe nodule, left lower lobe 10 mm groundglass nodule is more apparent, posterior left upper lobe circumscribed solid nodule measures 11 x 10 mm and is new  PET scan 06/22/2016-malignant range activity involving the 11 mm left upper lobe nodule, no other hypermetabolic nodules, suspicious sub-solid right lower lobe nodule  Wedge resection of a hypermetabolic left upper lobe nodule on 07/05/2016-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 No EGFR, ALK, BRAF, RET, ERBB2, or ROS1  alteration  CT chest 11/14/2016-status post left upper lobe wedge resection, stable right  lung nodules  Chest CT 05/02/2017-stable bilateral solid and groundglass nodules, stable mild mediastinal lymphadenopathy  CT chest 12/13/2017- enlargement of right upper lobe nodule, other lung nodules and chest lymph node stable  PET 01/09/2018- right upper lobe nodule and right lower lobe nodule have enlarged since 2018 and have low level metabolic activity increased size of an 8 mm right upper lobe nodule without increased metabolic activity, stable activity and a mildly enlarged lower paratracheal node, no evidence of metastatic disease in the abdomen or pelvis  CT chest 04/22/2018- no new lung nodules, dominant right lung nodules slightly increased in size  Bronchoscopy/EBUS 05/04/2019 with biopsy of level 7 node, biopsy of right lower lobe nodule, FNA of right lower lobe nodule-negative  CT biopsy of right upper lobe nodule 06/07/2018- adenocarcinoma with lepidic and acinar patterns  SBRT to the dominant right upper lobe nodule 07/23/2018-07/30/2018  CT 09/16/2018-slight increase in size of medial right upper lobe nodule-possibly secondary to interval radiation, other nodules are unchanged  CT 12/25/2018- medial right upper lobe nodule has decreased in size slightly, multiple additional solid and groundglass nodules are stable  CT 06/09/2019-enlarging right lower lobe nodule, enlarging subsolid nodule, new right upper lobe nodule  SBRT to right lower lobe lung nodule, 3 fractions 07/08/2019-07/14/2019  CT 12/05/2019-dominant right lower lobe lesion stable to slightly decreased in size, progressive solid component associated with a dominant right upper lung nodule, mild progression of a left upper lobe nodule, new subsolid anterior medial left upper lobe nodule  Cycle 1 Alimta/carboplatin/pembrolizumab 02/04/2020  Cycle 2 Alimta/carboplatin/pembrolizumab 02/23/2020 5.  Baker's cyst left leg, pain and swelling in the left calf, negative Doppler 01/24/2020 and 01/29/2020-evaluated by orthopedics and  underwent aspiration of the cyst 01/29/2000, completed a course of Keflex     Disposition: Ms. Arts appears stable.  She completed cycle 1 Alimta/carboplatin/pembrolizumab 02/04/2020. Plan to proceed with cycle 2 today as scheduled.    She had mild delayed nausea followed by constipation.  She will begin a laxative regimen today.  She will contact the office with poorly controlled nausea or constipation.  We reviewed the CBC and chemistry panel from today.  Labs adequate to proceed with treatment.  She will return for lab, follow-up, cycle 3 Alimta/carboplatin/pembrolizumab in 3 weeks.  She will contact the office in the interim as outlined above or with any other problems.  Patient seen with Dr. Benay Spice.    Ned Card ANP/GNP-BC   02/23/2020  12:36 PM This was a shared visit with Ned Card.  Ms. Benham had mild delayed nausea following cycle 1 Alimta/carboplatin/pembrolizumab.  She will try lorazepam as needed.  She will begin a laxative regimen.  Julieanne Manson, MD

## 2020-02-23 NOTE — Patient Instructions (Signed)
[facility] Discharge Instructions for Patients Receiving Chemotherapy  Today you received the following chemotherapy agents Pembrolizumab (KEYTRUDA), Pemetrexed (ALIMTA) & Carboplatin (PARAPLATIN).  To help prevent nausea and vomiting after your treatment, we encourage you to take your nausea medication {as prescribed.   If you develop nausea and vomiting that is not controlled by your nausea medication, call the clinic.   BELOW ARE SYMPTOMS THAT SHOULD BE REPORTED IMMEDIATELY:  *FEVER GREATER THAN 100.5 F  *CHILLS WITH OR WITHOUT FEVER  NAUSEA AND VOMITING THAT IS NOT CONTROLLED WITH YOUR NAUSEA MEDICATION  *UNUSUAL SHORTNESS OF BREATH  *UNUSUAL BRUISING OR BLEEDING  TENDERNESS IN MOUTH AND THROAT WITH OR WITHOUT PRESENCE OF ULCERS  *URINARY PROBLEMS  *BOWEL PROBLEMS  UNUSUAL RASH Items with * indicate a potential emergency and should be followed up as soon as possible.  Feel free to call the clinic should you have any questions or concerns. The clinic phone number is [phone].

## 2020-02-27 ENCOUNTER — Telehealth: Payer: Self-pay | Admitting: Oncology

## 2020-02-27 NOTE — Telephone Encounter (Signed)
Scheduled per 10/18 los, patient has been called and voicemail was left.

## 2020-03-05 ENCOUNTER — Telehealth: Payer: Self-pay | Admitting: Nurse Practitioner

## 2020-03-05 NOTE — Telephone Encounter (Signed)
Scheduled per 10/18 los, patient has been called but it seems like there's no voicemail set up.

## 2020-03-14 ENCOUNTER — Other Ambulatory Visit: Payer: Self-pay | Admitting: Oncology

## 2020-03-15 ENCOUNTER — Inpatient Hospital Stay: Payer: BC Managed Care – PPO | Attending: Oncology

## 2020-03-15 ENCOUNTER — Other Ambulatory Visit: Payer: Self-pay

## 2020-03-15 ENCOUNTER — Inpatient Hospital Stay (HOSPITAL_BASED_OUTPATIENT_CLINIC_OR_DEPARTMENT_OTHER): Payer: BC Managed Care – PPO | Admitting: Nurse Practitioner

## 2020-03-15 ENCOUNTER — Inpatient Hospital Stay: Payer: BC Managed Care – PPO

## 2020-03-15 ENCOUNTER — Encounter: Payer: Self-pay | Admitting: Nurse Practitioner

## 2020-03-15 VITALS — BP 140/70 | HR 92 | Temp 98.8°F | Resp 17 | Ht 65.0 in | Wt 150.7 lb

## 2020-03-15 DIAGNOSIS — Z85048 Personal history of other malignant neoplasm of rectum, rectosigmoid junction, and anus: Secondary | ICD-10-CM | POA: Insufficient documentation

## 2020-03-15 DIAGNOSIS — Z5111 Encounter for antineoplastic chemotherapy: Secondary | ICD-10-CM | POA: Diagnosis not present

## 2020-03-15 DIAGNOSIS — C3412 Malignant neoplasm of upper lobe, left bronchus or lung: Secondary | ICD-10-CM

## 2020-03-15 DIAGNOSIS — Z853 Personal history of malignant neoplasm of breast: Secondary | ICD-10-CM | POA: Insufficient documentation

## 2020-03-15 DIAGNOSIS — Z170 Estrogen receptor positive status [ER+]: Secondary | ICD-10-CM | POA: Insufficient documentation

## 2020-03-15 DIAGNOSIS — C3411 Malignant neoplasm of upper lobe, right bronchus or lung: Secondary | ICD-10-CM | POA: Diagnosis not present

## 2020-03-15 DIAGNOSIS — Z923 Personal history of irradiation: Secondary | ICD-10-CM | POA: Diagnosis not present

## 2020-03-15 DIAGNOSIS — Z79899 Other long term (current) drug therapy: Secondary | ICD-10-CM | POA: Insufficient documentation

## 2020-03-15 DIAGNOSIS — C3431 Malignant neoplasm of lower lobe, right bronchus or lung: Secondary | ICD-10-CM | POA: Insufficient documentation

## 2020-03-15 LAB — CMP (CANCER CENTER ONLY)
ALT: 18 U/L (ref 0–44)
AST: 23 U/L (ref 15–41)
Albumin: 3.7 g/dL (ref 3.5–5.0)
Alkaline Phosphatase: 61 U/L (ref 38–126)
Anion gap: 10 (ref 5–15)
BUN: 22 mg/dL — ABNORMAL HIGH (ref 6–20)
CO2: 28 mmol/L (ref 22–32)
Calcium: 9 mg/dL (ref 8.9–10.3)
Chloride: 100 mmol/L (ref 98–111)
Creatinine: 0.81 mg/dL (ref 0.44–1.00)
GFR, Estimated: >60 mL/min (ref >60)
Glucose, Bld: 101 mg/dL — ABNORMAL HIGH (ref 70–99)
Potassium: 4.6 mmol/L (ref 3.5–5.1)
Sodium: 138 mmol/L (ref 135–145)
Total Bilirubin: 0.5 mg/dL (ref 0.3–1.2)
Total Protein: 7.2 g/dL (ref 6.5–8.1)

## 2020-03-15 LAB — CBC WITH DIFFERENTIAL (CANCER CENTER ONLY)
Abs Immature Granulocytes: 0.01 10*3/uL (ref 0.00–0.07)
Basophils Absolute: 0 10*3/uL (ref 0.0–0.1)
Basophils Relative: 0 %
Eosinophils Absolute: 0.1 10*3/uL (ref 0.0–0.5)
Eosinophils Relative: 2 %
HCT: 29.8 % — ABNORMAL LOW (ref 36.0–46.0)
Hemoglobin: 10.5 g/dL — ABNORMAL LOW (ref 12.0–15.0)
Immature Granulocytes: 0 %
Lymphocytes Relative: 19 %
Lymphs Abs: 0.9 10*3/uL (ref 0.7–4.0)
MCH: 33.7 pg (ref 26.0–34.0)
MCHC: 35.2 g/dL (ref 30.0–36.0)
MCV: 95.5 fL (ref 80.0–100.0)
Monocytes Absolute: 0.6 10*3/uL (ref 0.1–1.0)
Monocytes Relative: 12 %
Neutro Abs: 3 10*3/uL (ref 1.7–7.7)
Neutrophils Relative %: 67 %
Platelet Count: 278 10*3/uL (ref 150–400)
RBC: 3.12 MIL/uL — ABNORMAL LOW (ref 3.87–5.11)
RDW: 14.1 % (ref 11.5–15.5)
WBC Count: 4.5 10*3/uL (ref 4.0–10.5)
nRBC: 0 % (ref 0.0–0.2)

## 2020-03-15 LAB — TSH: TSH: 2.74 u[IU]/mL (ref 0.308–3.960)

## 2020-03-15 MED ORDER — SODIUM CHLORIDE 0.9 % IV SOLN
Freq: Once | INTRAVENOUS | Status: AC
  Filled 2020-03-15: qty 250

## 2020-03-15 MED ORDER — CYANOCOBALAMIN 1000 MCG/ML IJ SOLN
INTRAMUSCULAR | Status: AC
  Filled 2020-03-15: qty 1

## 2020-03-15 MED ORDER — CYANOCOBALAMIN 1000 MCG/ML IJ SOLN
1000.0000 ug | Freq: Once | INTRAMUSCULAR | Status: AC
  Administered 2020-03-15: 1000 ug via INTRAMUSCULAR

## 2020-03-15 MED ORDER — PALONOSETRON HCL INJECTION 0.25 MG/5ML
INTRAVENOUS | Status: AC
  Filled 2020-03-15: qty 5

## 2020-03-15 MED ORDER — SODIUM CHLORIDE 0.9 % IV SOLN
150.0000 mg | Freq: Once | INTRAVENOUS | Status: AC
  Administered 2020-03-15: 150 mg via INTRAVENOUS
  Filled 2020-03-15: qty 150

## 2020-03-15 MED ORDER — PALONOSETRON HCL INJECTION 0.25 MG/5ML
0.2500 mg | Freq: Once | INTRAVENOUS | Status: AC
  Administered 2020-03-15: 0.25 mg via INTRAVENOUS

## 2020-03-15 MED ORDER — SODIUM CHLORIDE 0.9 % IV SOLN
200.0000 mg | Freq: Once | INTRAVENOUS | Status: AC
  Administered 2020-03-15: 200 mg via INTRAVENOUS
  Filled 2020-03-15: qty 8

## 2020-03-15 MED ORDER — SODIUM CHLORIDE 0.9 % IV SOLN
520.0000 mg | Freq: Once | INTRAVENOUS | Status: AC
  Administered 2020-03-15: 520 mg via INTRAVENOUS
  Filled 2020-03-15: qty 52

## 2020-03-15 MED ORDER — SODIUM CHLORIDE 0.9 % IV SOLN
10.0000 mg | Freq: Once | INTRAVENOUS | Status: AC
  Administered 2020-03-15: 10 mg via INTRAVENOUS
  Filled 2020-03-15: qty 10

## 2020-03-15 MED ORDER — SODIUM CHLORIDE 0.9 % IV SOLN
500.0000 mg/m2 | Freq: Once | INTRAVENOUS | Status: AC
  Administered 2020-03-15: 900 mg via INTRAVENOUS
  Filled 2020-03-15: qty 20

## 2020-03-15 NOTE — Progress Notes (Signed)
[place] OFFICE PROGRESS NOTE   Diagnosis: Non-small cell lung cancer  INTERVAL HISTORY:   Gina Cervantes returns as scheduled.  She completed cycle 2 Alimta/carboplatin/pembrolizumab 02/23/2020.  She developed nausea around day 4.  She found Ativan to be helpful.  She did not develop constipation with this cycle.  No mouth sores.  No rash.  No diarrhea.  She is not aware of any bleeding.  Objective:  Vital signs in last 24 hours:  Blood pressure 140/70, pulse 92, temperature 98.8 F (37.1 C), temperature source Tympanic, resp. rate 17, height '5\' 5"'  (1.651 m), weight 150 lb 11.2 oz (68.4 kg), last menstrual period 04/07/2008, SpO2 97 %.    HEENT: No thrush or ulcers. Resp: Lungs clear bilaterally. Cardio: Regular rate and rhythm. GI: Abdomen soft and nontender.  No hepatomegaly. Vascular: No leg edema. Skin: No rash.   Lab Results:  Lab Results  Component Value Date   WBC 4.5 03/15/2020   HGB 10.5 (L) 03/15/2020   HCT 29.8 (L) 03/15/2020   MCV 95.5 03/15/2020   PLT 278 03/15/2020   NEUTROABS 3.0 03/15/2020    Imaging:  No results found.  Medications: I have reviewed the patient's current medications.  Assessment/Plan: 1.Anal cancer-left anal margin/anal canal mass, status post a biopsy on 08/09/2012 confirming invasive moderately differentiated squamous cell carcinoma,p16 positive.  -Staging PET scan 08/20/2012-hypermetabolic anal mass, no hypermetabolic metastases  -Initiation of concurrent radiation with cycle 1 of mitomycin C./5-fluorouracil 09/02/2012.  -She completed cycle 2 5-fluorouracil/mitomycin C. beginning 10/01/2012.  -She completed radiation 10/10/2012.  2. Right breast cancer 2009, ER positive, PR positive, HER-2 positive. She is status post neoadjuvant AC x4 cycles followed by Taxol/Herceptin. She underwent a right lumpectomy and sentinel lymph node biopsy 09/22/2008 with no evidence of residual invasive carcinoma and 5 negative  sentinel lymph nodes. She then completed right breast radiation. She began tamoxifen 12/15/2008 and completed adjuvant Herceptin 05/25/2009. The tamoxifen was discontinued and she was switched to Arimidex in July 2013. Arimidex discontinuedin mid July 2019. 3. History of enlargement of the right tonsil.  4. Nonspecific pulmonary nodules without hypermetabolic activity on the PET scan 08/20/2012. Chest CT 10/21/2012 with scattered pulmonary nodules including nodules that were not present in 2010. Annual surveillance was recommended. Follow-up chest CT 10/24/2013 with scattered groundglass densities again noted in both lungs mostly unchanged when compared to the previous study. A sub-solid right lower lobe nodule had increased central density. The size was unchanged.The nodule has been present since 2009.  CT chest 06/13/2016-a sub-solid 9 mm right upper lobe nodule has enlarged compared to 2015, a right lower lobe irregular nodular density appears more well defined, new vague 5 mm groundglass left upper lobe nodule, left lower lobe 10 mm groundglass nodule is more apparent, posterior left upper lobe circumscribed solid nodule measures 11 x 10 mm and is new  PET scan 06/22/2016-malignant range activity involving the 11 mm left upper lobe nodule, no other hypermetabolic nodules, suspicious sub-solid right lower lobe nodule  Wedge resection of a hypermetabolic left upper lobe nodule on 07/05/2016-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 No EGFR, ALK, BRAF, RET, ERBB2, or ROS1 alteration  CT chest 11/14/2016-status post left upper lobe wedge resection, stable right lung nodules  Chest CT 05/02/2017-stable bilateral solid and groundglass nodules, stable mild mediastinal lymphadenopathy  CT chest 12/13/2017-enlargement of right upper lobe nodule, other lung nodules and chest lymph  node  stable  PET 01/09/2018-right upper lobe nodule and right lower lobe nodule have enlarged since 2018 and have low level metabolic activity increased size of an 8 mm right upper lobe nodule without increased metabolic activity, stable activity and a mildly enlarged lower paratracheal node, no evidence of metastatic disease in the abdomen or pelvis  CT chest 04/22/2018-no new lung nodules, dominant right lung nodules slightly increased in size  Bronchoscopy/EBUS 05/04/2019 with biopsy of level 7 node, biopsy of right lower lobe nodule, FNA of right lower lobe nodule-negative  CT biopsy of right upper lobe nodule 06/07/2018-adenocarcinoma with lepidic and acinar patterns  SBRT to the dominant right upper lobe nodule 07/23/2018-07/30/2018  CT 09/16/2018-slight increase in size of medial right upper lobe nodule-possibly secondary to interval radiation, other nodules are unchanged  CT 12/25/2018-medial right upper lobe nodule has decreased in size slightly, multiple additional solid and groundglass nodules are stable  CT 06/09/2019-enlarging right lower lobe nodule, enlarging subsolid nodule, new right upper lobe nodule  SBRT to right lower lobe lung nodule, 3 fractions 07/08/2019-07/14/2019  CT 12/05/2019-dominant right lower lobe lesion stable to slightly decreased in size, progressive solid component associated with a dominant right upper lung nodule, mild progression of a left upper lobe nodule, new subsolid anterior medial left upper lobe nodule  Cycle 1 Alimta/carboplatin/pembrolizumab 02/04/2020  Cycle 2 Alimta/carboplatin/pembrolizumab 02/23/2020  Cycle 3 Alimta/carboplatin/pembrolizumab 03/15/2020 5. Baker's cyst left leg, pain and swelling in the left calf, negative Doppler 01/24/2020 and 01/29/2020-evaluated by orthopedics and underwent aspiration of the cyst 01/29/2000, completed a course of Keflex    Disposition: Gina Cervantes appears stable.  She has completed 2 cycles of  Alimta/carboplatin/pembrolizumab.  She tolerated cycle 2 better than the first cycle.  Plan to proceed with cycle 3 today as scheduled.  We reviewed the CBC from today.  Counts are adequate to proceed with treatment.  She has developed anemia.  There is no evidence of bleeding.  The anemia is likely related to treatment.  We will continue to monitor.  She will return for lab, follow-up, cycle 4 treatment in 3 weeks.  She will contact the office in the interim with any problems.  We specifically discussed signs/symptoms suggestive of progressive anemia.  Plan reviewed with Dr. Benay Spice.    Ned Card ANP/GNP-BC   03/15/2020  10:53 AM

## 2020-03-15 NOTE — Patient Instructions (Signed)
[facility] Discharge Instructions for Patients Receiving Chemotherapy  Today you received the following chemotherapy agents Pembrolizumab (KEYTRUDA), Pemetrexed (ALIMTA) & Carboplatin (PARAPLATIN).  To help prevent nausea and vomiting after your treatment, we encourage you to take your nausea medication {as prescribed.   If you develop nausea and vomiting that is not controlled by your nausea medication, call the clinic.   BELOW ARE SYMPTOMS THAT SHOULD BE REPORTED IMMEDIATELY:  *FEVER GREATER THAN 100.5 F  *CHILLS WITH OR WITHOUT FEVER  NAUSEA AND VOMITING THAT IS NOT CONTROLLED WITH YOUR NAUSEA MEDICATION  *UNUSUAL SHORTNESS OF BREATH  *UNUSUAL BRUISING OR BLEEDING  TENDERNESS IN MOUTH AND THROAT WITH OR WITHOUT PRESENCE OF ULCERS  *URINARY PROBLEMS  *BOWEL PROBLEMS  UNUSUAL RASH Items with * indicate a potential emergency and should be followed up as soon as possible.  Feel free to call the clinic should you have any questions or concerns. The clinic phone number is [phone].

## 2020-04-03 ENCOUNTER — Other Ambulatory Visit: Payer: Self-pay | Admitting: Oncology

## 2020-04-05 ENCOUNTER — Inpatient Hospital Stay: Payer: BC Managed Care – PPO

## 2020-04-05 ENCOUNTER — Inpatient Hospital Stay (HOSPITAL_BASED_OUTPATIENT_CLINIC_OR_DEPARTMENT_OTHER): Payer: BC Managed Care – PPO | Admitting: Oncology

## 2020-04-05 ENCOUNTER — Other Ambulatory Visit: Payer: Self-pay

## 2020-04-05 VITALS — BP 136/60 | HR 99 | Temp 98.7°F | Resp 16 | Ht 65.0 in | Wt 158.8 lb

## 2020-04-05 DIAGNOSIS — C3431 Malignant neoplasm of lower lobe, right bronchus or lung: Secondary | ICD-10-CM | POA: Diagnosis not present

## 2020-04-05 DIAGNOSIS — C3412 Malignant neoplasm of upper lobe, left bronchus or lung: Secondary | ICD-10-CM | POA: Diagnosis not present

## 2020-04-05 DIAGNOSIS — D490 Neoplasm of unspecified behavior of digestive system: Secondary | ICD-10-CM

## 2020-04-05 DIAGNOSIS — Z170 Estrogen receptor positive status [ER+]: Secondary | ICD-10-CM | POA: Diagnosis not present

## 2020-04-05 DIAGNOSIS — C3411 Malignant neoplasm of upper lobe, right bronchus or lung: Secondary | ICD-10-CM | POA: Diagnosis not present

## 2020-04-05 DIAGNOSIS — Z5111 Encounter for antineoplastic chemotherapy: Secondary | ICD-10-CM | POA: Diagnosis not present

## 2020-04-05 DIAGNOSIS — Z853 Personal history of malignant neoplasm of breast: Secondary | ICD-10-CM | POA: Diagnosis not present

## 2020-04-05 DIAGNOSIS — Z79899 Other long term (current) drug therapy: Secondary | ICD-10-CM | POA: Diagnosis not present

## 2020-04-05 DIAGNOSIS — Z923 Personal history of irradiation: Secondary | ICD-10-CM | POA: Diagnosis not present

## 2020-04-05 DIAGNOSIS — Z85048 Personal history of other malignant neoplasm of rectum, rectosigmoid junction, and anus: Secondary | ICD-10-CM | POA: Diagnosis not present

## 2020-04-05 LAB — CBC WITH DIFFERENTIAL (CANCER CENTER ONLY)
Abs Immature Granulocytes: 0.01 10*3/uL (ref 0.00–0.07)
Basophils Absolute: 0 10*3/uL (ref 0.0–0.1)
Basophils Relative: 1 %
Eosinophils Absolute: 0.1 10*3/uL (ref 0.0–0.5)
Eosinophils Relative: 3 %
HCT: 22.7 % — ABNORMAL LOW (ref 36.0–46.0)
Hemoglobin: 7.7 g/dL — ABNORMAL LOW (ref 12.0–15.0)
Immature Granulocytes: 0 %
Lymphocytes Relative: 24 %
Lymphs Abs: 0.8 10*3/uL (ref 0.7–4.0)
MCH: 36.8 pg — ABNORMAL HIGH (ref 26.0–34.0)
MCHC: 33.9 g/dL (ref 30.0–36.0)
MCV: 108.6 fL — ABNORMAL HIGH (ref 80.0–100.0)
Monocytes Absolute: 0.4 10*3/uL (ref 0.1–1.0)
Monocytes Relative: 14 %
Neutro Abs: 1.9 10*3/uL (ref 1.7–7.7)
Neutrophils Relative %: 58 %
Platelet Count: 237 10*3/uL (ref 150–400)
RBC: 2.09 MIL/uL — ABNORMAL LOW (ref 3.87–5.11)
RDW: 20.5 % — ABNORMAL HIGH (ref 11.5–15.5)
WBC Count: 3.2 10*3/uL — ABNORMAL LOW (ref 4.0–10.5)
nRBC: 1 % — ABNORMAL HIGH (ref 0.0–0.2)

## 2020-04-05 LAB — CMP (CANCER CENTER ONLY)
ALT: 14 U/L (ref 0–44)
AST: 23 U/L (ref 15–41)
Albumin: 3.8 g/dL (ref 3.5–5.0)
Alkaline Phosphatase: 71 U/L (ref 38–126)
Anion gap: 10 (ref 5–15)
BUN: 17 mg/dL (ref 6–20)
CO2: 29 mmol/L (ref 22–32)
Calcium: 9.2 mg/dL (ref 8.9–10.3)
Chloride: 100 mmol/L (ref 98–111)
Creatinine: 0.89 mg/dL (ref 0.44–1.00)
GFR, Estimated: >60 mL/min (ref >60)
Glucose, Bld: 132 mg/dL — ABNORMAL HIGH (ref 70–99)
Potassium: 3.7 mmol/L (ref 3.5–5.1)
Sodium: 139 mmol/L (ref 135–145)
Total Bilirubin: 0.5 mg/dL (ref 0.3–1.2)
Total Protein: 7.4 g/dL (ref 6.5–8.1)

## 2020-04-05 LAB — RETIC PANEL
Immature Retic Fract: 27.3 % — ABNORMAL HIGH (ref 2.3–15.9)
RBC.: 2.05 MIL/uL — ABNORMAL LOW (ref 3.87–5.11)
Retic Count, Absolute: 160.7 10*3/uL (ref 19.0–186.0)
Retic Ct Pct: 7.8 % — ABNORMAL HIGH (ref 0.4–3.1)
Reticulocyte Hemoglobin: 42.3 pg (ref >27.9)

## 2020-04-05 LAB — SAMPLE TO BLOOD BANK

## 2020-04-05 MED ORDER — SODIUM CHLORIDE 0.9 % IV SOLN
10.0000 mg | Freq: Once | INTRAVENOUS | Status: AC
  Administered 2020-04-05: 10 mg via INTRAVENOUS
  Filled 2020-04-05: qty 10

## 2020-04-05 MED ORDER — SODIUM CHLORIDE 0.9 % IV SOLN
200.0000 mg | Freq: Once | INTRAVENOUS | Status: AC
  Administered 2020-04-05: 200 mg via INTRAVENOUS
  Filled 2020-04-05: qty 8

## 2020-04-05 MED ORDER — SODIUM CHLORIDE 0.9 % IV SOLN
150.0000 mg | Freq: Once | INTRAVENOUS | Status: AC
  Administered 2020-04-05: 150 mg via INTRAVENOUS
  Filled 2020-04-05: qty 150

## 2020-04-05 MED ORDER — SODIUM CHLORIDE 0.9 % IV SOLN
Freq: Once | INTRAVENOUS | Status: AC
  Filled 2020-04-05: qty 250

## 2020-04-05 MED ORDER — SODIUM CHLORIDE 0.9 % IV SOLN
383.6000 mg | Freq: Once | INTRAVENOUS | Status: AC
  Administered 2020-04-05: 380 mg via INTRAVENOUS
  Filled 2020-04-05: qty 38

## 2020-04-05 MED ORDER — LORAZEPAM 0.5 MG PO TABS: 0.5000 mg | ORAL_TABLET | Freq: Three times a day (TID) | ORAL | 0 refills | Status: DC | PRN

## 2020-04-05 MED ORDER — PALONOSETRON HCL INJECTION 0.25 MG/5ML
INTRAVENOUS | Status: AC
  Filled 2020-04-05: qty 5

## 2020-04-05 MED ORDER — SODIUM CHLORIDE 0.9 % IV SOLN
400.0000 mg/m2 | Freq: Once | INTRAVENOUS | Status: AC
  Administered 2020-04-05: 700 mg via INTRAVENOUS
  Filled 2020-04-05: qty 20

## 2020-04-05 MED ORDER — PALONOSETRON HCL INJECTION 0.25 MG/5ML
0.2500 mg | Freq: Once | INTRAVENOUS | Status: AC
  Administered 2020-04-05: 0.25 mg via INTRAVENOUS

## 2020-04-05 NOTE — Progress Notes (Signed)
[place] OFFICE PROGRESS NOTE   Diagnosis: Non-small cell lung cancer  INTERVAL HISTORY:   Gina Cervantes returns as scheduled.  She completed another cycle of the Alimta/carboplatin/pembrolizumab on 03/15/2020.  Nausea following chemotherapy was relieved with Ativan.  She has fatigue.  No dyspnea.  She is not using the inhaler.  No bleeding.  She is able to go about her activities.  Objective:  Vital signs in last 24 hours:  Blood pressure 136/60, pulse 99, temperature 98.7 F (37.1 C), temperature source Tympanic, resp. rate 16, height 5' 5" (1.651 m), weight 158 lb 12.8 oz (72 kg), last menstrual period 04/07/2008, SpO2 100 %.    HEENT: No thrush or ulcers Resp: Lungs clear bilaterally Cardio: Regular rate and rhythm GI: No hepatosplenomegaly Vascular: No leg edema  Skin: No rash   Lab Results:  Lab Results  Component Value Date   WBC 3.2 (L) 04/05/2020   HGB 7.7 (L) 04/05/2020   HCT 22.7 (L) 04/05/2020   MCV 108.6 (H) 04/05/2020   PLT 237 04/05/2020   NEUTROABS 1.9 04/05/2020    CMP  Lab Results  Component Value Date   NA 139 04/05/2020   K 3.7 04/05/2020   CL 100 04/05/2020   CO2 29 04/05/2020   GLUCOSE 132 (H) 04/05/2020   BUN 17 04/05/2020   CREATININE 0.89 04/05/2020   CALCIUM 9.2 04/05/2020   PROT 7.4 04/05/2020   ALBUMIN 3.8 04/05/2020   AST 23 04/05/2020   ALT 14 04/05/2020   ALKPHOS 71 04/05/2020   BILITOT 0.5 04/05/2020   GFRNONAA >60 04/05/2020   GFRAA >60 01/29/2020     Medications: I have reviewed the patient's current medications.   Assessment/Plan: 1.Anal cancer-left anal margin/anal canal mass, status post a biopsy on 08/09/2012 confirming invasive moderately differentiated squamous cell carcinoma,p16 positive.  -Staging PET scan 08/20/2012-hypermetabolic anal mass, no hypermetabolic metastases  -Initiation of concurrent radiation with cycle 1 of mitomycin C./5-fluorouracil 09/02/2012.  -She completed cycle 2  5-fluorouracil/mitomycin C. beginning 10/01/2012.  -She completed radiation 10/10/2012.  2. Right breast cancer 2009, ER positive, PR positive, HER-2 positive. She is status post neoadjuvant AC x4 cycles followed by Taxol/Herceptin. She underwent a right lumpectomy and sentinel lymph node biopsy 09/22/2008 with no evidence of residual invasive carcinoma and 5 negative sentinel lymph nodes. She then completed right breast radiation. She began tamoxifen 12/15/2008 and completed adjuvant Herceptin 05/25/2009. The tamoxifen was discontinued and she was switched to Arimidex in July 2013. Arimidex discontinuedin mid July 2019. 3. History of enlargement of the right tonsil.  4. Nonspecific pulmonary nodules without hypermetabolic activity on the PET scan 08/20/2012. Chest CT 10/21/2012 with scattered pulmonary nodules including nodules that were not present in 2010. Annual surveillance was recommended. Follow-up chest CT 10/24/2013 with scattered groundglass densities again noted in both lungs mostly unchanged when compared to the previous study. A sub-solid right lower lobe nodule had increased central density. The size was unchanged.The nodule has been present since 2009.  CT chest 06/13/2016-a sub-solid 9 mm right upper lobe nodule has enlarged compared to 2015, a right lower lobe irregular nodular density appears more well defined, new vague 5 mm groundglass left upper lobe nodule, left lower lobe 10 mm groundglass nodule is more apparent, posterior left upper lobe circumscribed solid nodule measures 11 x 10 mm and is new  PET scan 06/22/2016-malignant range activity involving the 11 mm left upper lobe nodule, no other hypermetabolic nodules, suspicious sub-solid right lower lobe nodule  Wedge resection of  a hypermetabolic left upper lobe nodule on 07/05/2016-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 No EGFR, ALK, BRAF, RET, ERBB2, or ROS1 alteration  CT chest 11/14/2016-status post left upper lobe wedge resection, stable right lung nodules  Chest CT 05/02/2017-stable bilateral solid and groundglass nodules, stable mild mediastinal lymphadenopathy  CT chest 12/13/2017-enlargement of right upper lobe nodule, other lung nodules and chest lymph node stable  PET 01/09/2018-right upper lobe nodule and right lower lobe nodule have enlarged since 2018 and have low level metabolic activity increased size of an 8 mm right upper lobe nodule without increased metabolic activity, stable activity and a mildly enlarged lower paratracheal node, no evidence of metastatic disease in the abdomen or pelvis  CT chest 04/22/2018-no new lung nodules, dominant right lung nodules slightly increased in size  Bronchoscopy/EBUS 05/04/2019 with biopsy of level 7 node, biopsy of right lower lobe nodule, FNA of right lower lobe nodule-negative  CT biopsy of right upper lobe nodule 06/07/2018-adenocarcinoma with lepidic and acinar patterns  SBRT to the dominant right upper lobe nodule 07/23/2018-07/30/2018  CT 09/16/2018-slight increase in size of medial right upper lobe nodule-possibly secondary to interval radiation, other nodules are unchanged  CT 12/25/2018-medial right upper lobe nodule has decreased in size slightly, multiple additional solid and groundglass nodules are stable  CT 06/09/2019-enlarging right lower lobe nodule, enlarging subsolid nodule, new right upper lobe nodule  SBRT to right lower lobe lung nodule, 3 fractions 07/08/2019-07/14/2019  CT 12/05/2019-dominant right lower lobe lesion stable to slightly decreased in size, progressive solid component associated with a dominant right upper lung nodule, mild progression of a left upper lobe nodule, new subsolid anterior medial left upper lobe nodule  Cycle 1 Alimta/carboplatin/pembrolizumab 02/04/2020  Cycle 2  Alimta/carboplatin/pembrolizumab 02/23/2020  Cycle 3 Alimta/carboplatin/pembrolizumab 03/15/2020  Cycle 4 Alimta/carboplatin/pembrolizumab 04/05/2020 (Alimta and carboplatin dose reduced secondary to anemia) 5. Baker's cyst left leg, pain and swelling in the left calf, negative Doppler 01/24/2020 and 01/29/2020-evaluated by orthopedics and underwent aspiration of the cyst 01/29/2000, completed a course of Keflex 6.  Anemia secondary to chemotherapy      Disposition: Ms. Schutter has completed 3 cycles of systemic therapy with Alimta/carboplatin and pembrolizumab.  She has tolerated the treatment well, though she has developed progressive anemia.  The anemia is likely secondary to chemotherapy.  She has been heavily pretreated with chemotherapy and is more likely to develop anemia with the current treatment. She declines a red cell transfusion today.  She will complete cycle for systemic therapy today.  The Alimta and carboplatin will be dose reduced secondary to anemia.  She will call for increased symptoms of anemia. Ms. Wenk will return for an office visit after a restaging chest CT next week.  We will check the CBC when she returns next week.   Betsy Coder, MD  04/05/2020  12:31 PM

## 2020-04-05 NOTE — Patient Instructions (Signed)
[facility] Discharge Instructions for Patients Receiving Chemotherapy  Today you received the following chemotherapy agents: pembrolizumab/pemetrexed/carboplatin.  To help prevent nausea and vomiting after your treatment, we encourage you to take your nausea medication as directed.   If you develop nausea and vomiting that is not controlled by your nausea medication, call the clinic.   BELOW ARE SYMPTOMS THAT SHOULD BE REPORTED IMMEDIATELY:  *FEVER GREATER THAN 100.5 F  *CHILLS WITH OR WITHOUT FEVER  NAUSEA AND VOMITING THAT IS NOT CONTROLLED WITH YOUR NAUSEA MEDICATION  *UNUSUAL SHORTNESS OF BREATH  *UNUSUAL BRUISING OR BLEEDING  TENDERNESS IN MOUTH AND THROAT WITH OR WITHOUT PRESENCE OF ULCERS  *URINARY PROBLEMS  *BOWEL PROBLEMS  UNUSUAL RASH Items with * indicate a potential emergency and should be followed up as soon as possible.  Feel free to call the clinic should you have any questions or concerns. The clinic phone number is [phone].  Please show the [place] at check-in to the Emergency Department and triage nurse.

## 2020-04-05 NOTE — Progress Notes (Signed)
Per Dr. Benay Spice: OK to treat today w/Hgb 7.7--patient declines transfusion

## 2020-04-06 ENCOUNTER — Telehealth: Payer: Self-pay | Admitting: Oncology

## 2020-04-06 ENCOUNTER — Encounter: Payer: Self-pay | Admitting: Oncology

## 2020-04-06 NOTE — Telephone Encounter (Signed)
Scheduled appointment per 11/30 los. Called patient, no answer. Left message for patient with appointment date and time.

## 2020-04-08 ENCOUNTER — Other Ambulatory Visit: Payer: Self-pay | Admitting: *Deleted

## 2020-04-08 DIAGNOSIS — D649 Anemia, unspecified: Secondary | ICD-10-CM

## 2020-04-08 NOTE — Progress Notes (Signed)
Placed lab/transfusion orders and placed #2 orders for type and screen due to this being her 1st transfusion. Notified MD that attestation is required.

## 2020-04-09 ENCOUNTER — Other Ambulatory Visit: Payer: Self-pay

## 2020-04-09 ENCOUNTER — Inpatient Hospital Stay: Payer: BC Managed Care – PPO | Attending: Oncology

## 2020-04-09 ENCOUNTER — Other Ambulatory Visit: Payer: Self-pay | Admitting: Oncology

## 2020-04-09 ENCOUNTER — Other Ambulatory Visit: Payer: Self-pay | Admitting: *Deleted

## 2020-04-09 DIAGNOSIS — Z853 Personal history of malignant neoplasm of breast: Secondary | ICD-10-CM | POA: Insufficient documentation

## 2020-04-09 DIAGNOSIS — Z5111 Encounter for antineoplastic chemotherapy: Secondary | ICD-10-CM | POA: Diagnosis not present

## 2020-04-09 DIAGNOSIS — Z170 Estrogen receptor positive status [ER+]: Secondary | ICD-10-CM | POA: Insufficient documentation

## 2020-04-09 DIAGNOSIS — Z85048 Personal history of other malignant neoplasm of rectum, rectosigmoid junction, and anus: Secondary | ICD-10-CM | POA: Diagnosis not present

## 2020-04-09 DIAGNOSIS — Z79899 Other long term (current) drug therapy: Secondary | ICD-10-CM | POA: Insufficient documentation

## 2020-04-09 DIAGNOSIS — D649 Anemia, unspecified: Secondary | ICD-10-CM

## 2020-04-09 DIAGNOSIS — C3411 Malignant neoplasm of upper lobe, right bronchus or lung: Secondary | ICD-10-CM | POA: Diagnosis not present

## 2020-04-09 DIAGNOSIS — Z923 Personal history of irradiation: Secondary | ICD-10-CM | POA: Diagnosis not present

## 2020-04-09 DIAGNOSIS — C3431 Malignant neoplasm of lower lobe, right bronchus or lung: Secondary | ICD-10-CM | POA: Insufficient documentation

## 2020-04-09 LAB — PREPARE RBC (CROSSMATCH)

## 2020-04-10 ENCOUNTER — Inpatient Hospital Stay: Payer: BC Managed Care – PPO

## 2020-04-10 DIAGNOSIS — Z85048 Personal history of other malignant neoplasm of rectum, rectosigmoid junction, and anus: Secondary | ICD-10-CM | POA: Diagnosis not present

## 2020-04-10 DIAGNOSIS — Z79899 Other long term (current) drug therapy: Secondary | ICD-10-CM | POA: Diagnosis not present

## 2020-04-10 DIAGNOSIS — Z170 Estrogen receptor positive status [ER+]: Secondary | ICD-10-CM | POA: Diagnosis not present

## 2020-04-10 DIAGNOSIS — D649 Anemia, unspecified: Secondary | ICD-10-CM

## 2020-04-10 DIAGNOSIS — Z5111 Encounter for antineoplastic chemotherapy: Secondary | ICD-10-CM | POA: Diagnosis not present

## 2020-04-10 DIAGNOSIS — Z853 Personal history of malignant neoplasm of breast: Secondary | ICD-10-CM | POA: Diagnosis not present

## 2020-04-10 DIAGNOSIS — Z923 Personal history of irradiation: Secondary | ICD-10-CM | POA: Diagnosis not present

## 2020-04-10 DIAGNOSIS — C3411 Malignant neoplasm of upper lobe, right bronchus or lung: Secondary | ICD-10-CM | POA: Diagnosis not present

## 2020-04-10 DIAGNOSIS — C3431 Malignant neoplasm of lower lobe, right bronchus or lung: Secondary | ICD-10-CM | POA: Diagnosis not present

## 2020-04-10 MED ORDER — SODIUM CHLORIDE 0.9% IV SOLUTION
250.0000 mL | Freq: Once | INTRAVENOUS | Status: AC
  Administered 2020-04-10: 250 mL via INTRAVENOUS
  Filled 2020-04-10: qty 250

## 2020-04-10 NOTE — Patient Instructions (Signed)

## 2020-04-11 LAB — TYPE AND SCREEN
ABO/RH(D): B POS
Antibody Screen: NEGATIVE
Unit division: 0
Unit division: 0

## 2020-04-11 LAB — BPAM RBC
Blood Product Expiration Date: 202201042359
Blood Product Expiration Date: 202201042359
ISSUE DATE / TIME: 202112040845
ISSUE DATE / TIME: 202112040845
Unit Type and Rh: 7300
Unit Type and Rh: 7300

## 2020-04-12 ENCOUNTER — Other Ambulatory Visit: Payer: Self-pay

## 2020-04-12 ENCOUNTER — Ambulatory Visit
Admission: RE | Admit: 2020-04-12 | Discharge: 2020-04-12 | Disposition: A | Discharge status: Home / Self Care | Payer: BC Managed Care – PPO | Source: Ambulatory Visit | Attending: Oncology | Admitting: Oncology

## 2020-04-12 DIAGNOSIS — I2510 Atherosclerotic heart disease of native coronary artery without angina pectoris: Secondary | ICD-10-CM | POA: Diagnosis not present

## 2020-04-12 DIAGNOSIS — C3411 Malignant neoplasm of upper lobe, right bronchus or lung: Secondary | ICD-10-CM

## 2020-04-12 DIAGNOSIS — C50911 Malignant neoplasm of unspecified site of right female breast: Secondary | ICD-10-CM | POA: Diagnosis not present

## 2020-04-12 DIAGNOSIS — J432 Centrilobular emphysema: Secondary | ICD-10-CM | POA: Diagnosis not present

## 2020-04-12 DIAGNOSIS — C3491 Malignant neoplasm of unspecified part of right bronchus or lung: Secondary | ICD-10-CM | POA: Diagnosis not present

## 2020-04-13 ENCOUNTER — Other Ambulatory Visit: Payer: Self-pay

## 2020-04-13 ENCOUNTER — Inpatient Hospital Stay (HOSPITAL_BASED_OUTPATIENT_CLINIC_OR_DEPARTMENT_OTHER): Payer: BC Managed Care – PPO | Admitting: Oncology

## 2020-04-13 ENCOUNTER — Inpatient Hospital Stay: Payer: BC Managed Care – PPO

## 2020-04-13 VITALS — BP 139/78 | HR 98 | Temp 99.8°F | Resp 17 | Ht 65.0 in | Wt 157.5 lb

## 2020-04-13 DIAGNOSIS — C3412 Malignant neoplasm of upper lobe, left bronchus or lung: Secondary | ICD-10-CM | POA: Diagnosis not present

## 2020-04-13 DIAGNOSIS — Z85048 Personal history of other malignant neoplasm of rectum, rectosigmoid junction, and anus: Secondary | ICD-10-CM | POA: Diagnosis not present

## 2020-04-13 DIAGNOSIS — C3431 Malignant neoplasm of lower lobe, right bronchus or lung: Secondary | ICD-10-CM | POA: Diagnosis not present

## 2020-04-13 DIAGNOSIS — Z170 Estrogen receptor positive status [ER+]: Secondary | ICD-10-CM | POA: Diagnosis not present

## 2020-04-13 DIAGNOSIS — Z853 Personal history of malignant neoplasm of breast: Secondary | ICD-10-CM | POA: Diagnosis not present

## 2020-04-13 DIAGNOSIS — Z5111 Encounter for antineoplastic chemotherapy: Secondary | ICD-10-CM | POA: Diagnosis not present

## 2020-04-13 DIAGNOSIS — C3411 Malignant neoplasm of upper lobe, right bronchus or lung: Secondary | ICD-10-CM | POA: Diagnosis not present

## 2020-04-13 DIAGNOSIS — Z79899 Other long term (current) drug therapy: Secondary | ICD-10-CM | POA: Diagnosis not present

## 2020-04-13 DIAGNOSIS — Z923 Personal history of irradiation: Secondary | ICD-10-CM | POA: Diagnosis not present

## 2020-04-13 LAB — CBC WITH DIFFERENTIAL (CANCER CENTER ONLY)
Abs Immature Granulocytes: 0.02 10*3/uL (ref 0.00–0.07)
Basophils Absolute: 0 10*3/uL (ref 0.0–0.1)
Basophils Relative: 1 %
Eosinophils Absolute: 0.1 10*3/uL (ref 0.0–0.5)
Eosinophils Relative: 2 %
HCT: 29.4 % — ABNORMAL LOW (ref 36.0–46.0)
Hemoglobin: 10.3 g/dL — ABNORMAL LOW (ref 12.0–15.0)
Immature Granulocytes: 1 %
Lymphocytes Relative: 18 %
Lymphs Abs: 0.7 10*3/uL (ref 0.7–4.0)
MCH: 35.6 pg — ABNORMAL HIGH (ref 26.0–34.0)
MCHC: 35 g/dL (ref 30.0–36.0)
MCV: 101.7 fL — ABNORMAL HIGH (ref 80.0–100.0)
Monocytes Absolute: 0.7 10*3/uL (ref 0.1–1.0)
Monocytes Relative: 16 %
Neutro Abs: 2.5 10*3/uL (ref 1.7–7.7)
Neutrophils Relative %: 62 %
Platelet Count: 203 10*3/uL (ref 150–400)
RBC: 2.89 MIL/uL — ABNORMAL LOW (ref 3.87–5.11)
RDW: 20.1 % — ABNORMAL HIGH (ref 11.5–15.5)
WBC Count: 4 10*3/uL (ref 4.0–10.5)
nRBC: 0 % (ref 0.0–0.2)

## 2020-04-13 LAB — SAMPLE TO BLOOD BANK

## 2020-04-13 NOTE — Progress Notes (Signed)
[facility] OFFICE PROGRESS NOTE   Diagnosis: Non-small cell lung cancer  INTERVAL HISTORY:   Gina Cervantes completed another cycle of Alimta/carboplatin/pembrolizumab on 04/05/2020.  She reports nausea following chemotherapy.  The nausea was relieved with Ativan.  She developed increased symptoms of anemia and was transfused with packed red blood cells on 04/10/2020.  She reports feeling much better after the red cell transfusion.  She developed diarrhea beginning on 04/11/2020.  The diarrhea improved with Pepto-Bismol.  The diarrhea is better today.  Objective:  Vital signs in last 24 hours:  Blood pressure 139/78, pulse 98, temperature 99.8 F (37.7 C), temperature source Tympanic, resp. rate 17, height '5\' 5"'  (1.651 m), weight 157 lb 8 oz (71.4 kg), last menstrual period 04/07/2008, SpO2 100 %.     Resp: Lungs clear bilaterally Cardio: Regular rate and rhythm GI: No hepatosplenomegaly, nontender Vascular: No leg edema Skin: No rash  Portacath/PICC-without erythema  Lab Results:  Lab Results  Component Value Date   WBC 4.0 04/13/2020   HGB 10.3 (L) 04/13/2020   HCT 29.4 (L) 04/13/2020   MCV 101.7 (H) 04/13/2020   PLT 203 04/13/2020   NEUTROABS 2.5 04/13/2020    CMP  Lab Results  Component Value Date   NA 139 04/05/2020   K 3.7 04/05/2020   CL 100 04/05/2020   CO2 29 04/05/2020   GLUCOSE 132 (H) 04/05/2020   BUN 17 04/05/2020   CREATININE 0.89 04/05/2020   CALCIUM 9.2 04/05/2020   PROT 7.4 04/05/2020   ALBUMIN 3.8 04/05/2020   AST 23 04/05/2020   ALT 14 04/05/2020   ALKPHOS 71 04/05/2020   BILITOT 0.5 04/05/2020   GFRNONAA >60 04/05/2020   GFRAA >60 01/29/2020    No results found for: CEA1  Lab Results  Component Value Date   INR 1.04 06/07/2018    Imaging:  CT CHEST WO CONTRAST  Result Date: 04/13/2020 CLINICAL DATA:  Non-small cell lung cancer, status post XRT. Right breast cancer, status post lumpectomy and chemotherapy. EXAM: CT  CHEST WITHOUT CONTRAST TECHNIQUE: Multidetector CT imaging of the chest was performed following the standard protocol without IV contrast. COMPARISON:  12/05/2019. FINDINGS: Cardiovascular: Heart is normal in size.  No pericardial effusion. No evidence of thoracic aortic aneurysm. Atherosclerotic calcifications of the aortic arch. Coronary atherosclerosis the LAD and left circumflex. Mediastinum/Nodes: Small mediastinal lymph nodes which do not meet pathologic CT size criteria. Visualized thyroid is unremarkable. Lungs/Pleura: 13 x 13 mm nodule in the superior segment right lower lobe (series 3/image 93), previously 12 x 18 mm when measured in a similar fashion. Dominant 15 x 20 mm nodule in the medial right upper lobe (series 3/image 48), previously 17 x 23 mm when measured in a similar fashion. Adjacent 12 mm nodule (series 3/image 47), previously 13 mm. Additional 8 mm nodule in the medial right upper lobe (series 3/image 69), previously 11 mm. Status post left upper lobe wedge resection. Two ground-glass nodules in the left upper lobe measuring up to 10 x 17 mm (series 3/image 17), unchanged. 14 mm ground-glass nodule in the left lower lobe (series 3/image 94), unchanged. Mild centrilobular and paraseptal emphysematous changes in the upper lobes. No focal consolidation. No pleural effusion or pneumothorax. Upper Abdomen: Visualized upper abdomen is grossly unremarkable. Musculoskeletal: Postsurgical changes with surgical clip in the central right breast. No focal osseous lesions. IMPRESSION: Suspected multifocal adenocarcinoma, with index nodules as above. Solid nodules in the right upper and lower lobes are improved. Ground-glass nodules in  the left upper and lower lobes are unchanged. Postsurgical changes in the right breast. Aortic Atherosclerosis (ICD10-I70.0) and Emphysema (ICD10-J43.9). Electronically Signed   By: Julian Hy M.D.   On: 04/13/2020 10:13    Medications: I have reviewed the  patient's current medications.   Assessment/Plan: 1.Anal cancer-left anal margin/anal canal mass, status post a biopsy on 08/09/2012 confirming invasive moderately differentiated squamous cell carcinoma,p16 positive.  -Staging PET scan 08/20/2012-hypermetabolic anal mass, no hypermetabolic metastases  -Initiation of concurrent radiation with cycle 1 of mitomycin C./5-fluorouracil 09/02/2012.  -She completed cycle 2 5-fluorouracil/mitomycin C. beginning 10/01/2012.  -She completed radiation 10/10/2012.  2. Right breast cancer 2009, ER positive, PR positive, HER-2 positive. She is status post neoadjuvant AC x4 cycles followed by Taxol/Herceptin. She underwent a right lumpectomy and sentinel lymph node biopsy 09/22/2008 with no evidence of residual invasive carcinoma and 5 negative sentinel lymph nodes. She then completed right breast radiation. She began tamoxifen 12/15/2008 and completed adjuvant Herceptin 05/25/2009. The tamoxifen was discontinued and she was switched to Arimidex in July 2013. Arimidex discontinuedin mid July 2019. 3. History of enlargement of the right tonsil.  4. Nonspecific pulmonary nodules without hypermetabolic activity on the PET scan 08/20/2012. Chest CT 10/21/2012 with scattered pulmonary nodules including nodules that were not present in 2010. Annual surveillance was recommended. Follow-up chest CT 10/24/2013 with scattered groundglass densities again noted in both lungs mostly unchanged when compared to the previous study. A sub-solid right lower lobe nodule had increased central density. The size was unchanged.The nodule has been present since 2009.  CT chest 06/13/2016-a sub-solid 9 mm right upper lobe nodule has enlarged compared to 2015, a right lower lobe irregular nodular density appears more well defined, new vague 5 mm groundglass left upper lobe nodule, left lower lobe 10 mm groundglass nodule is more apparent, posterior left upper lobe circumscribed solid  nodule measures 11 x 10 mm and is new  PET scan 06/22/2016-malignant range activity involving the 11 mm left upper lobe nodule, no other hypermetabolic nodules, suspicious sub-solid right lower lobe nodule  Wedge resection of a hypermetabolic left upper lobe nodule on 07/05/2016-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 No EGFR, ALK, BRAF, RET, ERBB2, or ROS1 alteration  CT chest 11/14/2016-status post left upper lobe wedge resection, stable right lung nodules  Chest CT 05/02/2017-stable bilateral solid and groundglass nodules, stable mild mediastinal lymphadenopathy  CT chest 12/13/2017-enlargement of right upper lobe nodule, other lung nodules and chest lymph node stable  PET 01/09/2018-right upper lobe nodule and right lower lobe nodule have enlarged since 2018 and have low level metabolic activity increased size of an 8 mm right upper lobe nodule without increased metabolic activity, stable activity and a mildly enlarged lower paratracheal node, no evidence of metastatic disease in the abdomen or pelvis  CT chest 04/22/2018-no new lung nodules, dominant right lung nodules slightly increased in size  Bronchoscopy/EBUS 05/04/2019 with biopsy of level 7 node, biopsy of right lower lobe nodule, FNA of right lower lobe nodule-negative  CT biopsy of right upper lobe nodule 06/07/2018-adenocarcinoma with lepidic and acinar patterns  SBRT to the dominant right upper lobe nodule 07/23/2018-07/30/2018  CT 09/16/2018-slight increase in size of medial right upper lobe nodule-possibly secondary to interval radiation, other nodules are unchanged  CT 12/25/2018-medial right upper lobe nodule has decreased in size slightly, multiple additional solid and groundglass nodules are stable  CT 06/09/2019-enlarging right lower lobe nodule, enlarging  subsolid nodule, new right upper lobe  nodule  SBRT to right lower lobe lung nodule, 3 fractions 07/08/2019-07/14/2019  CT 12/05/2019-dominant right lower lobe lesion stable to slightly decreased in size, progressive solid component associated with a dominant right upper lung nodule, mild progression of a left upper lobe nodule, new subsolid anterior medial left upper lobe nodule  Cycle 1 Alimta/carboplatin/pembrolizumab 02/04/2020  Cycle 2 Alimta/carboplatin/pembrolizumab 02/23/2020  Cycle 3 Alimta/carboplatin/pembrolizumab 03/15/2020  Cycle 4 Alimta/carboplatin/pembrolizumab 04/05/2020 (Alimta and carboplatin dose reduced secondary to anemia)  CT chest 04/12/2020-decreased size of solid right lower lobe and right upper lobe nodules, groundglass nodules in the left lung unchanged 5. Baker's cyst left leg, pain and swelling in the left calf, negative Doppler 01/24/2020 and 01/29/2020-evaluated by orthopedics and underwent aspiration of the cyst 01/29/2000, completed a course of Keflex 6.  Anemia secondary to chemotherapy, status post 2 units of packed red blood cells on 04/10/2020   Disposition: Gina Cervantes has completed 4 cycles of systemic therapy.  She has tolerated the treatment well aside from anemia.  The restaging CTs are consistent with partial clinical improvement.  I reviewed the CT images with her.  There was also a 64-monthgap between the baseline CT and initiation of treatment.  I suspect the severe anemia is secondary to chemotherapy.  She has a history of multiple courses of chemotherapy in the past, likely contributing to the anemia.  Hopefully the anemia will not be as severe with elimination of carboplatin from the systemic regimen.  I recommend continuing treatment with Alimta/pembrolizumab for 4 cycles prior to another restaging chest CT.  She would like to delay the next cycle of Alimta/pembrolizumab until after Christmas.  She will return for an office visit and treatment on 05/03/2020.  GBetsy Coder  MD  04/13/2020  11:05 AM

## 2020-04-19 ENCOUNTER — Telehealth: Payer: Self-pay | Admitting: Oncology

## 2020-04-19 NOTE — Telephone Encounter (Signed)
Scheduled per 12/10 staff msg. Called pt and left a msg

## 2020-05-02 ENCOUNTER — Other Ambulatory Visit: Payer: Self-pay | Admitting: Oncology

## 2020-05-03 ENCOUNTER — Encounter: Payer: Self-pay | Admitting: Nurse Practitioner

## 2020-05-03 ENCOUNTER — Inpatient Hospital Stay (HOSPITAL_BASED_OUTPATIENT_CLINIC_OR_DEPARTMENT_OTHER): Payer: BC Managed Care – PPO | Admitting: Nurse Practitioner

## 2020-05-03 ENCOUNTER — Inpatient Hospital Stay: Payer: BC Managed Care – PPO

## 2020-05-03 ENCOUNTER — Other Ambulatory Visit: Payer: Self-pay

## 2020-05-03 ENCOUNTER — Other Ambulatory Visit: Payer: BC Managed Care – PPO

## 2020-05-03 VITALS — BP 125/61 | HR 96 | Temp 98.1°F | Resp 14 | Ht 65.0 in | Wt 157.0 lb

## 2020-05-03 DIAGNOSIS — Z5111 Encounter for antineoplastic chemotherapy: Secondary | ICD-10-CM | POA: Diagnosis not present

## 2020-05-03 DIAGNOSIS — C3411 Malignant neoplasm of upper lobe, right bronchus or lung: Secondary | ICD-10-CM | POA: Diagnosis not present

## 2020-05-03 DIAGNOSIS — Z85048 Personal history of other malignant neoplasm of rectum, rectosigmoid junction, and anus: Secondary | ICD-10-CM | POA: Diagnosis not present

## 2020-05-03 DIAGNOSIS — C3412 Malignant neoplasm of upper lobe, left bronchus or lung: Secondary | ICD-10-CM

## 2020-05-03 DIAGNOSIS — Z853 Personal history of malignant neoplasm of breast: Secondary | ICD-10-CM | POA: Diagnosis not present

## 2020-05-03 DIAGNOSIS — Z79899 Other long term (current) drug therapy: Secondary | ICD-10-CM | POA: Diagnosis not present

## 2020-05-03 DIAGNOSIS — C3431 Malignant neoplasm of lower lobe, right bronchus or lung: Secondary | ICD-10-CM | POA: Diagnosis not present

## 2020-05-03 DIAGNOSIS — Z170 Estrogen receptor positive status [ER+]: Secondary | ICD-10-CM | POA: Diagnosis not present

## 2020-05-03 DIAGNOSIS — Z923 Personal history of irradiation: Secondary | ICD-10-CM | POA: Diagnosis not present

## 2020-05-03 LAB — CBC WITH DIFFERENTIAL (CANCER CENTER ONLY)
Abs Immature Granulocytes: 0.06 10*3/uL (ref 0.00–0.07)
Basophils Absolute: 0.1 10*3/uL (ref 0.0–0.1)
Basophils Relative: 1 %
Eosinophils Absolute: 0.1 10*3/uL (ref 0.0–0.5)
Eosinophils Relative: 2 %
HCT: 32.7 % — ABNORMAL LOW (ref 36.0–46.0)
Hemoglobin: 10.8 g/dL — ABNORMAL LOW (ref 12.0–15.0)
Immature Granulocytes: 1 %
Lymphocytes Relative: 12 %
Lymphs Abs: 1 10*3/uL (ref 0.7–4.0)
MCH: 37.5 pg — ABNORMAL HIGH (ref 26.0–34.0)
MCHC: 33 g/dL (ref 30.0–36.0)
MCV: 113.5 fL — ABNORMAL HIGH (ref 80.0–100.0)
Monocytes Absolute: 1.3 10*3/uL — ABNORMAL HIGH (ref 0.1–1.0)
Monocytes Relative: 14 %
Neutro Abs: 6.2 10*3/uL (ref 1.7–7.7)
Neutrophils Relative %: 70 %
Platelet Count: 205 10*3/uL (ref 150–400)
RBC: 2.88 MIL/uL — ABNORMAL LOW (ref 3.87–5.11)
WBC Count: 8.7 10*3/uL (ref 4.0–10.5)
nRBC: 0 % (ref 0.0–0.2)

## 2020-05-03 LAB — CMP (CANCER CENTER ONLY)
ALT: 15 U/L (ref 0–44)
AST: 19 U/L (ref 15–41)
Albumin: 3.6 g/dL (ref 3.5–5.0)
Alkaline Phosphatase: 67 U/L (ref 38–126)
Anion gap: 8 (ref 5–15)
BUN: 17 mg/dL (ref 6–20)
CO2: 34 mmol/L — ABNORMAL HIGH (ref 22–32)
Calcium: 9.1 mg/dL (ref 8.9–10.3)
Chloride: 97 mmol/L — ABNORMAL LOW (ref 98–111)
Creatinine: 0.96 mg/dL (ref 0.44–1.00)
GFR, Estimated: >60 mL/min (ref >60)
Glucose, Bld: 104 mg/dL — ABNORMAL HIGH (ref 70–99)
Potassium: 4.2 mmol/L (ref 3.5–5.1)
Sodium: 139 mmol/L (ref 135–145)
Total Bilirubin: 0.3 mg/dL (ref 0.3–1.2)
Total Protein: 7.5 g/dL (ref 6.5–8.1)

## 2020-05-03 LAB — TSH: TSH: 1.092 u[IU]/mL (ref 0.308–3.960)

## 2020-05-03 LAB — SAMPLE TO BLOOD BANK

## 2020-05-03 MED ORDER — SODIUM CHLORIDE 0.9 % IV SOLN
400.0000 mg/m2 | Freq: Once | INTRAVENOUS | Status: AC
  Administered 2020-05-03: 700 mg via INTRAVENOUS
  Filled 2020-05-03: qty 8

## 2020-05-03 MED ORDER — PROCHLORPERAZINE MALEATE 10 MG PO TABS
ORAL_TABLET | ORAL | Status: AC
  Filled 2020-05-03: qty 1

## 2020-05-03 MED ORDER — SODIUM CHLORIDE 0.9 % IV SOLN
200.0000 mg | Freq: Once | INTRAVENOUS | Status: AC
  Administered 2020-05-03: 200 mg via INTRAVENOUS
  Filled 2020-05-03: qty 8

## 2020-05-03 MED ORDER — SODIUM CHLORIDE 0.9 % IV SOLN
Freq: Once | INTRAVENOUS | Status: AC
  Filled 2020-05-03: qty 250

## 2020-05-03 MED ORDER — PROCHLORPERAZINE MALEATE 10 MG PO TABS
10.0000 mg | ORAL_TABLET | Freq: Once | ORAL | Status: AC
  Administered 2020-05-03: 10 mg via ORAL

## 2020-05-03 NOTE — Progress Notes (Signed)
[facility] OFFICE PROGRESS NOTE   Diagnosis: Non-small cell lung cancer  INTERVAL HISTORY:   Gina Cervantes returns as scheduled.  She completed cycle 4 carboplatin/Alimta/pembrolizumab 04/05/2020.  She had mild nausea after chemotherapy.  No mouth sores.  No diarrhea.  No rash.  She is recovering from a "head cold".  No fever or shortness of breath.  Objective:  Vital signs in last 24 hours:  Blood pressure 125/61, pulse 96, temperature 98.1 F (36.7 C), temperature source Tympanic, resp. rate 14, height _0  (1.651 m), weight 157 lb (71.2 kg), last menstrual period 04/07/2008, SpO2 98 %.    HEENT: No thrush or ulcers. Resp: Lungs clear bilaterally. Cardio: Regular rate and rhythm. GI: Abdomen soft and nontender.  No hepatomegaly. Vascular: No leg edema. Skin: No rash.   Lab Results:  Lab Results  Component Value Date   WBC 4.0 04/13/2020   HGB 10.3 (L) 04/13/2020   HCT 29.4 (L) 04/13/2020   MCV 101.7 (H) 04/13/2020   PLT 203 04/13/2020   NEUTROABS 2.5 04/13/2020    Imaging:  No results found.  Medications: I have reviewed the patient's current medications.  Assessment/Plan: 1.Anal cancer-left anal margin/anal canal mass, status post a biopsy on 08/09/2012 confirming invasive moderately differentiated squamous cell carcinoma,p16 positive.  -Staging PET scan 08/20/2012-hypermetabolic anal mass, no hypermetabolic metastases  -Initiation of concurrent radiation with cycle 1 of mitomycin C./5-fluorouracil 09/02/2012.  -She completed cycle 2 5-fluorouracil/mitomycin C. beginning 10/01/2012.  -She completed radiation 10/10/2012.  2. Right breast cancer 2009, ER positive, PR positive, HER-2 positive. She is status post neoadjuvant AC x4 cycles followed by Taxol/Herceptin. She underwent a right lumpectomy and sentinel lymph node biopsy 09/22/2008 with no evidence of residual invasive carcinoma and 5 negative sentinel lymph nodes. She then completed right  breast radiation. She began tamoxifen 12/15/2008 and completed adjuvant Herceptin 05/25/2009. The tamoxifen was discontinued and she was switched to Arimidex in July 2013. Arimidex discontinuedin mid July 2019. 3. History of enlargement of the right tonsil.  4. Nonspecific pulmonary nodules without hypermetabolic activity on the PET scan 08/20/2012. Chest CT 10/21/2012 with scattered pulmonary nodules including nodules that were not present in 2010. Annual surveillance was recommended. Follow-up chest CT 10/24/2013 with scattered groundglass densities again noted in both lungs mostly unchanged when compared to the previous study. A sub-solid right lower lobe nodule had increased central density. The size was unchanged.The nodule has been present since 2009.  CT chest 06/13/2016-a sub-solid 9 mm right upper lobe nodule has enlarged compared to 2015, a right lower lobe irregular nodular density appears more well defined, new vague 5 mm groundglass left upper lobe nodule, left lower lobe 10 mm groundglass nodule is more apparent, posterior left upper lobe circumscribed solid nodule measures 11 x 10 mm and is new  PET scan 06/22/2016-malignant range activity involving the 11 mm left upper lobe nodule, no other hypermetabolic nodules, suspicious sub-solid right lower lobe nodule  Wedge resection of a hypermetabolic left upper lobe nodule on 07/05/2016-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 No EGFR, ALK, BRAF, RET, ERBB2, or ROS1 alteration  CT chest 11/14/2016-status post left upper lobe wedge resection, stable right lung nodules  Chest CT 05/02/2017-stable bilateral solid and groundglass nodules, stable mild mediastinal lymphadenopathy  CT chest 12/13/2017-enlargement of right upper lobe nodule, other lung nodules and chest lymph node stable  PET 01/09/2018-right upper lobe  nodule and right lower  lobe nodule have enlarged since 2018 and have low level metabolic activity increased size of an 8 mm right upper lobe nodule without increased metabolic activity, stable activity and a mildly enlarged lower paratracheal node, no evidence of metastatic disease in the abdomen or pelvis  CT chest 04/22/2018-no new lung nodules, dominant right lung nodules slightly increased in size  Bronchoscopy/EBUS 05/04/2019 with biopsy of level 7 node, biopsy of right lower lobe nodule, FNA of right lower lobe nodule-negative  CT biopsy of right upper lobe nodule 06/07/2018-adenocarcinoma with lepidic and acinar patterns  SBRT to the dominant right upper lobe nodule 07/23/2018-07/30/2018  CT 09/16/2018-slight increase in size of medial right upper lobe nodule-possibly secondary to interval radiation, other nodules are unchanged  CT 12/25/2018-medial right upper lobe nodule has decreased in size slightly, multiple additional solid and groundglass nodules are stable  CT 06/09/2019-enlarging right lower lobe nodule, enlarging subsolid nodule, new right upper lobe nodule  SBRT to right lower lobe lung nodule, 3 fractions 07/08/2019-07/14/2019  CT 12/05/2019-dominant right lower lobe lesion stable to slightly decreased in size, progressive solid component associated with a dominant right upper lung nodule, mild progression of a left upper lobe nodule, new subsolid anterior medial left upper lobe nodule  Cycle 1 Alimta/carboplatin/pembrolizumab 02/04/2020  Cycle 2 Alimta/carboplatin/pembrolizumab 02/23/2020  Cycle 3 Alimta/carboplatin/pembrolizumab 03/15/2020  Cycle 4 Alimta/carboplatin/pembrolizumab 04/05/2020 (Alimta and carboplatin dose reduced secondary to anemia)  CT chest 04/12/2020-decreased size of solid right lower lobe and right upper lobe nodules, groundglass nodules in the left lung unchanged  Cycle 5 Alimta/pembrolizumab 05/03/2020 5. Baker's cyst left leg, pain and swelling in the  left calf, negative Doppler 01/24/2020 and 01/29/2020-evaluated by orthopedics and underwent aspiration of the cyst 01/29/2000, completed a course of Keflex 6.  Anemia secondary to chemotherapy, status post 2 units of packed red blood cells on 04/10/2020   Disposition: Gina Cervantes appears stable.  She has completed 4 cycles of Alimta/carboplatin/pembrolizumab.  Dr. Gearldine Shown recommendation is to continue Alimta/pembrolizumab for 4 additional cycles prior to another restaging chest CT.  Gina Cervantes agrees with this plan, cycle 5 Alimta/pembrolizumab today as scheduled.  We reviewed the CBC from today.  Counts are adequate to proceed as above.  She will return for lab, follow-up, Alimta/pembrolizumab in 3 weeks.  She will contact the office in the interim with any problems.  We specifically discussed signs/symptoms suggestive of progressive anemia.    Ned Card ANP/GNP-BC   05/03/2020  1:56 PM

## 2020-05-03 NOTE — Patient Instructions (Signed)
[facility] Discharge Instructions for Patients Receiving Chemotherapy  Today you received the following chemotherapy agents: pembrolizumab/pemetrexed.  To help prevent nausea and vomiting after your treatment, we encourage you to take your nausea medication as directed.   If you develop nausea and vomiting that is not controlled by your nausea medication, call the clinic.   BELOW ARE SYMPTOMS THAT SHOULD BE REPORTED IMMEDIATELY:  *FEVER GREATER THAN 100.5 F  *CHILLS WITH OR WITHOUT FEVER  NAUSEA AND VOMITING THAT IS NOT CONTROLLED WITH YOUR NAUSEA MEDICATION  *UNUSUAL SHORTNESS OF BREATH  *UNUSUAL BRUISING OR BLEEDING  TENDERNESS IN MOUTH AND THROAT WITH OR WITHOUT PRESENCE OF ULCERS  *URINARY PROBLEMS  *BOWEL PROBLEMS  UNUSUAL RASH Items with * indicate a potential emergency and should be followed up as soon as possible.  Feel free to call the clinic should you have any questions or concerns. The clinic phone number is [phone].  Please show the [place] at check-in to the Emergency Department and triage nurse.

## 2020-05-04 ENCOUNTER — Telehealth: Payer: Self-pay | Admitting: Nurse Practitioner

## 2020-05-04 NOTE — Telephone Encounter (Signed)
Scheduled appointments per 12/27 los. Spoke to patient who is aware of appointments dates and times.

## 2020-05-06 DIAGNOSIS — J0100 Acute maxillary sinusitis, unspecified: Secondary | ICD-10-CM | POA: Diagnosis not present

## 2020-05-17 ENCOUNTER — Other Ambulatory Visit: Payer: Self-pay | Admitting: Oncology

## 2020-05-21 ENCOUNTER — Inpatient Hospital Stay (HOSPITAL_BASED_OUTPATIENT_CLINIC_OR_DEPARTMENT_OTHER): Payer: BC Managed Care – PPO | Admitting: Oncology

## 2020-05-21 ENCOUNTER — Inpatient Hospital Stay: Payer: BC Managed Care – PPO

## 2020-05-21 ENCOUNTER — Other Ambulatory Visit: Payer: Self-pay

## 2020-05-21 ENCOUNTER — Inpatient Hospital Stay: Payer: BC Managed Care – PPO | Attending: Oncology

## 2020-05-21 VITALS — BP 102/81 | HR 98 | Temp 97.9°F | Resp 18 | Ht 65.0 in | Wt 161.1 lb

## 2020-05-21 DIAGNOSIS — C3412 Malignant neoplasm of upper lobe, left bronchus or lung: Secondary | ICD-10-CM | POA: Diagnosis not present

## 2020-05-21 DIAGNOSIS — C3411 Malignant neoplasm of upper lobe, right bronchus or lung: Secondary | ICD-10-CM | POA: Diagnosis not present

## 2020-05-21 DIAGNOSIS — Z853 Personal history of malignant neoplasm of breast: Secondary | ICD-10-CM | POA: Diagnosis not present

## 2020-05-21 DIAGNOSIS — Z5112 Encounter for antineoplastic immunotherapy: Secondary | ICD-10-CM | POA: Diagnosis not present

## 2020-05-21 DIAGNOSIS — Z85048 Personal history of other malignant neoplasm of rectum, rectosigmoid junction, and anus: Secondary | ICD-10-CM | POA: Insufficient documentation

## 2020-05-21 DIAGNOSIS — Z79899 Other long term (current) drug therapy: Secondary | ICD-10-CM | POA: Diagnosis not present

## 2020-05-21 DIAGNOSIS — C3431 Malignant neoplasm of lower lobe, right bronchus or lung: Secondary | ICD-10-CM | POA: Insufficient documentation

## 2020-05-21 DIAGNOSIS — Z170 Estrogen receptor positive status [ER+]: Secondary | ICD-10-CM | POA: Insufficient documentation

## 2020-05-21 DIAGNOSIS — Z923 Personal history of irradiation: Secondary | ICD-10-CM | POA: Insufficient documentation

## 2020-05-21 LAB — CBC WITH DIFFERENTIAL (CANCER CENTER ONLY)
Abs Immature Granulocytes: 0.01 10*3/uL (ref 0.00–0.07)
Basophils Absolute: 0 10*3/uL (ref 0.0–0.1)
Basophils Relative: 1 %
Eosinophils Absolute: 0.1 10*3/uL (ref 0.0–0.5)
Eosinophils Relative: 3 %
HCT: 34 % — ABNORMAL LOW (ref 36.0–46.0)
Hemoglobin: 11.5 g/dL — ABNORMAL LOW (ref 12.0–15.0)
Immature Granulocytes: 0 %
Lymphocytes Relative: 20 %
Lymphs Abs: 0.9 10*3/uL (ref 0.7–4.0)
MCH: 38.7 pg — ABNORMAL HIGH (ref 26.0–34.0)
MCHC: 33.8 g/dL (ref 30.0–36.0)
MCV: 114.5 fL — ABNORMAL HIGH (ref 80.0–100.0)
Monocytes Absolute: 0.6 10*3/uL (ref 0.1–1.0)
Monocytes Relative: 14 %
Neutro Abs: 2.6 10*3/uL (ref 1.7–7.7)
Neutrophils Relative %: 62 %
Platelet Count: 294 10*3/uL (ref 150–400)
RBC: 2.97 MIL/uL — ABNORMAL LOW (ref 3.87–5.11)
RDW: 18.6 % — ABNORMAL HIGH (ref 11.5–15.5)
WBC Count: 4.2 10*3/uL (ref 4.0–10.5)
nRBC: 0 % (ref 0.0–0.2)

## 2020-05-21 LAB — CMP (CANCER CENTER ONLY)
ALT: 19 U/L (ref 0–44)
AST: 24 U/L (ref 15–41)
Albumin: 3.8 g/dL (ref 3.5–5.0)
Alkaline Phosphatase: 72 U/L (ref 38–126)
Anion gap: 8 (ref 5–15)
BUN: 17 mg/dL (ref 6–20)
CO2: 31 mmol/L (ref 22–32)
Calcium: 9.3 mg/dL (ref 8.9–10.3)
Chloride: 99 mmol/L (ref 98–111)
Creatinine: 0.81 mg/dL (ref 0.44–1.00)
GFR, Estimated: >60 mL/min (ref >60)
Glucose, Bld: 115 mg/dL — ABNORMAL HIGH (ref 70–99)
Potassium: 3.9 mmol/L (ref 3.5–5.1)
Sodium: 138 mmol/L (ref 135–145)
Total Bilirubin: 0.3 mg/dL (ref 0.3–1.2)
Total Protein: 7.8 g/dL (ref 6.5–8.1)

## 2020-05-21 MED ORDER — PROCHLORPERAZINE MALEATE 10 MG PO TABS
ORAL_TABLET | ORAL | Status: AC
  Filled 2020-05-21: qty 1

## 2020-05-21 MED ORDER — SODIUM CHLORIDE 0.9 % IV SOLN
Freq: Once | INTRAVENOUS | Status: AC
  Filled 2020-05-21: qty 250

## 2020-05-21 MED ORDER — CYANOCOBALAMIN 1000 MCG/ML IJ SOLN
1000.0000 ug | Freq: Once | INTRAMUSCULAR | Status: AC
  Administered 2020-05-21: 1000 ug via INTRAMUSCULAR

## 2020-05-21 MED ORDER — PEMETREXED DISODIUM CHEMO INJECTION 500 MG
400.0000 mg/m2 | Freq: Once | INTRAVENOUS | Status: AC
  Administered 2020-05-21: 700 mg via INTRAVENOUS
  Filled 2020-05-21: qty 20

## 2020-05-21 MED ORDER — PROCHLORPERAZINE MALEATE 10 MG PO TABS
10.0000 mg | ORAL_TABLET | Freq: Once | ORAL | Status: AC
  Administered 2020-05-21: 10 mg via ORAL

## 2020-05-21 MED ORDER — CYANOCOBALAMIN 1000 MCG/ML IJ SOLN
INTRAMUSCULAR | Status: AC
  Filled 2020-05-21: qty 1

## 2020-05-21 MED ORDER — SODIUM CHLORIDE 0.9 % IV SOLN
200.0000 mg | Freq: Once | INTRAVENOUS | Status: AC
  Administered 2020-05-21: 200 mg via INTRAVENOUS
  Filled 2020-05-21: qty 8

## 2020-05-21 NOTE — Progress Notes (Signed)
[facility] OFFICE PROGRESS NOTE   Diagnosis: Non-small cell lung cancer  INTERVAL HISTORY:   [name] completed another cycle of Alimta/pembrolizumab on 05/03/2020.  She had malaise following chemotherapy.  She otherwise felt well.  No diarrhea.  She had a rash over the chin and neck.  This did not occur immediately following chemotherapy.  The rash has resolved.  She is walking 1.5 miles.  Objective:  Vital signs in last 24 hours:  Blood pressure 102/81, pulse 98, temperature 97.9 F (36.6 C), temperature source Tympanic, resp. rate 18, height $RemoveBe'5\' 5"'[id number]$  (1.651 m), weight 161 lb 1.6 oz (73.1 kg), last menstrual period 04/07/2008, SpO2 100 %.    HEENT: No thrush or ulcers Resp: Mild end expiratory wheeze at the right lower posterior chest, no respiratory distress Cardio: Regular rate and rhythm GI: No hepatosplenomegaly Vascular: The left lower leg is slightly larger than the right side, no erythema or tenderness Skin: Mild brown discoloration at the lower leg bilaterally, no rash    Lab Results:  Lab Results  Component Value Date   WBC 4.2 05/21/2020   HGB 11.5 (L) 05/21/2020   HCT 34.0 (L) 05/21/2020   MCV 114.5 (H) 05/21/2020   PLT 294 05/21/2020   NEUTROABS 2.6 05/21/2020    CMP  Lab Results  Component Value Date   NA 138 05/21/2020   K 3.9 05/21/2020   CL 99 05/21/2020   CO2 31 05/21/2020   GLUCOSE 115 (H) 05/21/2020   BUN 17 05/21/2020   CREATININE 0.81 05/21/2020   CALCIUM 9.3 05/21/2020   PROT 7.8 05/21/2020   ALBUMIN 3.8 05/21/2020   AST 24 05/21/2020   ALT 19 05/21/2020   ALKPHOS 72 05/21/2020   BILITOT 0.3 05/21/2020   GFRNONAA >60 05/21/2020   GFRAA >60 01/29/2020    Medications: I have reviewed the patient's current medications.   Assessment/Plan:  1.Anal cancer-left anal margin/anal canal mass, status post a biopsy on 08/09/2012 confirming invasive moderately differentiated squamous cell carcinoma,p16 positive.  -Staging  PET scan 08/20/2012-hypermetabolic anal mass, no hypermetabolic metastases  -Initiation of concurrent radiation with cycle 1 of mitomycin C./5-fluorouracil 09/02/2012.  -She completed cycle 2 5-fluorouracil/mitomycin C. beginning 10/01/2012.  -She completed radiation 10/10/2012.  2. Right breast cancer 2009, ER positive, PR positive, HER-2 positive. She is status post neoadjuvant AC x4 cycles followed by Taxol/Herceptin. She underwent a right lumpectomy and sentinel lymph node biopsy 09/22/2008 with no evidence of residual invasive carcinoma and 5 negative sentinel lymph nodes. She then completed right breast radiation. She began tamoxifen 12/15/2008 and completed adjuvant Herceptin 05/25/2009. The tamoxifen was discontinued and she was switched to Arimidex in July 2013. Arimidex discontinuedin mid July 2019. 3. History of enlargement of the right tonsil.  4. Nonspecific pulmonary nodules without hypermetabolic activity on the PET scan 08/20/2012. Chest CT 10/21/2012 with scattered pulmonary nodules including nodules that were not present in 2010. Annual surveillance was recommended. Follow-up chest CT 10/24/2013 with scattered groundglass densities again noted in both lungs mostly unchanged when compared to the previous study. A sub-solid right lower lobe nodule had increased central density. The size was unchanged.The nodule has been present since 2009.  CT chest 06/13/2016-a sub-solid 9 mm right upper lobe nodule has enlarged compared to 2015, a right lower lobe irregular nodular density appears more well defined, new vague 5 mm groundglass left upper lobe nodule, left lower lobe 10 mm groundglass nodule is more apparent, posterior left upper lobe circumscribed solid nodule measures 11 x  10 mm and is new  PET scan 06/22/2016-malignant range activity involving the 11 mm left upper lobe nodule, no other hypermetabolic nodules, suspicious sub-solid right lower lobe nodule  Wedge resection of a  hypermetabolic left upper lobe nodule on 07/05/2016-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 No EGFR, ALK, BRAF, RET, ERBB2, or ROS1 alteration  CT chest 11/14/2016-status post left upper lobe wedge resection, stable right lung nodules  Chest CT 05/02/2017-stable bilateral solid and groundglass nodules, stable mild mediastinal lymphadenopathy  CT chest 12/13/2017-enlargement of right upper lobe nodule, other lung nodules and chest lymph node stable  PET 01/09/2018-right upper lobe nodule and right lower lobe nodule have enlarged since 2018 and have low level metabolic activity increased size of an 8 mm right upper lobe nodule without increased metabolic activity, stable activity and a mildly enlarged lower paratracheal node, no evidence of metastatic disease in the abdomen or pelvis  CT chest 04/22/2018-no new lung nodules, dominant right lung nodules slightly increased in size  Bronchoscopy/EBUS 05/04/2019 with biopsy of level 7 node, biopsy of right lower lobe nodule, FNA of right lower lobe nodule-negative  CT biopsy of right upper lobe nodule 06/07/2018-adenocarcinoma with lepidic and acinar patterns  SBRT to the dominant right upper lobe nodule 07/23/2018-07/30/2018  CT 09/16/2018-slight increase in size of medial right upper lobe nodule-possibly secondary to interval radiation, other nodules are unchanged  CT 12/25/2018-medial right upper lobe nodule has decreased in size slightly, multiple additional solid and groundglass nodules are stable  CT 06/09/2019-enlarging right lower lobe nodule, enlarging subsolid nodule, new right upper lobe nodule  SBRT to right lower lobe lung nodule, 3 fractions 07/08/2019-07/14/2019  CT 12/05/2019-dominant right lower lobe lesion stable to slightly decreased in size, progressive solid component associated with a dominant right upper  lung nodule, mild progression of a left upper lobe nodule, new subsolid anterior medial left upper lobe nodule  Cycle 1 Alimta/carboplatin/pembrolizumab 02/04/2020  Cycle 2 Alimta/carboplatin/pembrolizumab 02/23/2020  Cycle 3 Alimta/carboplatin/pembrolizumab 03/15/2020  Cycle 4 Alimta/carboplatin/pembrolizumab 04/05/2020 (Alimta and carboplatin dose reduced secondary to anemia)  CT chest 04/12/2020-decreased size of solid right lower lobe and right upper lobe nodules, groundglass nodules in the left lung unchanged  Cycle 5 Alimta/pembrolizumab 05/03/2020  Cycle 6 Alimta/pembrolizumab 05/21/2020 5. Baker's cyst left leg, pain and swelling in the left calf, negative Doppler 01/24/2020 and 01/29/2020-evaluated by orthopedics and underwent aspiration of the cyst 01/29/2000, completed a course of Keflex 6.  Anemia secondary to chemotherapy, status post 2 units of packed red blood cells on 04/10/2020-improved    Disposition: Gina Cervantes appears well.  She tolerated the last cycle of systemic therapy well.  The anemia has improved over the past month while off of carboplatin.  She will complete another treatment with Alimta/pembrolizumab today.  She will return for an office visit in the next cycle of systemic therapy on 06/14/2020.  We will plan for a restaging chest CT in approximately 2 months.  Gina Coder, MD  05/21/2020  3:01 PM

## 2020-05-21 NOTE — Patient Instructions (Signed)
[facility] Discharge Instructions for Patients Receiving Chemotherapy  Today you received the following chemotherapy agents: pembrolizumab/pemetrexed.  To help prevent nausea and vomiting after your treatment, we encourage you to take your nausea medication as directed.   If you develop nausea and vomiting that is not controlled by your nausea medication, call the clinic.   BELOW ARE SYMPTOMS THAT SHOULD BE REPORTED IMMEDIATELY:  *FEVER GREATER THAN 100.5 F  *CHILLS WITH OR WITHOUT FEVER  NAUSEA AND VOMITING THAT IS NOT CONTROLLED WITH YOUR NAUSEA MEDICATION  *UNUSUAL SHORTNESS OF BREATH  *UNUSUAL BRUISING OR BLEEDING  TENDERNESS IN MOUTH AND THROAT WITH OR WITHOUT PRESENCE OF ULCERS  *URINARY PROBLEMS  *BOWEL PROBLEMS  UNUSUAL RASH Items with * indicate a potential emergency and should be followed up as soon as possible.  Feel free to call the clinic should you have any questions or concerns. The clinic phone number is [phone].  Please show the [facility] at check-in to the Emergency Department and triage nurse.

## 2020-05-24 ENCOUNTER — Other Ambulatory Visit: Payer: BC Managed Care – PPO

## 2020-05-24 ENCOUNTER — Inpatient Hospital Stay: Payer: BC Managed Care – PPO | Admitting: Oncology

## 2020-05-24 ENCOUNTER — Inpatient Hospital Stay: Payer: BC Managed Care – PPO

## 2020-06-13 ENCOUNTER — Other Ambulatory Visit: Payer: Self-pay | Admitting: Oncology

## 2020-06-14 ENCOUNTER — Inpatient Hospital Stay: Payer: BC Managed Care – PPO

## 2020-06-14 ENCOUNTER — Encounter: Payer: Self-pay | Admitting: Nurse Practitioner

## 2020-06-14 ENCOUNTER — Other Ambulatory Visit: Payer: Self-pay

## 2020-06-14 ENCOUNTER — Inpatient Hospital Stay: Payer: BC Managed Care – PPO | Attending: Oncology

## 2020-06-14 ENCOUNTER — Inpatient Hospital Stay (HOSPITAL_BASED_OUTPATIENT_CLINIC_OR_DEPARTMENT_OTHER): Payer: BC Managed Care – PPO | Admitting: Nurse Practitioner

## 2020-06-14 VITALS — BP 113/79 | HR 97 | Temp 99.2°F | Resp 18 | Ht 65.0 in | Wt 161.7 lb

## 2020-06-14 DIAGNOSIS — D6481 Anemia due to antineoplastic chemotherapy: Secondary | ICD-10-CM | POA: Insufficient documentation

## 2020-06-14 DIAGNOSIS — Z923 Personal history of irradiation: Secondary | ICD-10-CM | POA: Insufficient documentation

## 2020-06-14 DIAGNOSIS — Z9221 Personal history of antineoplastic chemotherapy: Secondary | ICD-10-CM | POA: Diagnosis not present

## 2020-06-14 DIAGNOSIS — C3412 Malignant neoplasm of upper lobe, left bronchus or lung: Secondary | ICD-10-CM | POA: Diagnosis not present

## 2020-06-14 DIAGNOSIS — T451X5A Adverse effect of antineoplastic and immunosuppressive drugs, initial encounter: Secondary | ICD-10-CM | POA: Diagnosis not present

## 2020-06-14 DIAGNOSIS — Z853 Personal history of malignant neoplasm of breast: Secondary | ICD-10-CM | POA: Diagnosis not present

## 2020-06-14 DIAGNOSIS — C3431 Malignant neoplasm of lower lobe, right bronchus or lung: Secondary | ICD-10-CM | POA: Insufficient documentation

## 2020-06-14 DIAGNOSIS — C3411 Malignant neoplasm of upper lobe, right bronchus or lung: Secondary | ICD-10-CM | POA: Diagnosis not present

## 2020-06-14 DIAGNOSIS — Z85048 Personal history of other malignant neoplasm of rectum, rectosigmoid junction, and anus: Secondary | ICD-10-CM | POA: Insufficient documentation

## 2020-06-14 DIAGNOSIS — Z5112 Encounter for antineoplastic immunotherapy: Secondary | ICD-10-CM | POA: Insufficient documentation

## 2020-06-14 DIAGNOSIS — Z5111 Encounter for antineoplastic chemotherapy: Secondary | ICD-10-CM | POA: Diagnosis not present

## 2020-06-14 LAB — CBC WITH DIFFERENTIAL (CANCER CENTER ONLY)
Abs Immature Granulocytes: 0.01 K/uL (ref 0.00–0.07)
Basophils Absolute: 0.1 K/uL (ref 0.0–0.1)
Basophils Relative: 2 %
Eosinophils Absolute: 0.1 K/uL (ref 0.0–0.5)
Eosinophils Relative: 4 %
HCT: 34.5 % — ABNORMAL LOW (ref 36.0–46.0)
Hemoglobin: 11.8 g/dL — ABNORMAL LOW (ref 12.0–15.0)
Immature Granulocytes: 0 %
Lymphocytes Relative: 21 %
Lymphs Abs: 0.7 K/uL (ref 0.7–4.0)
MCH: 39.1 pg — ABNORMAL HIGH (ref 26.0–34.0)
MCHC: 34.2 g/dL (ref 30.0–36.0)
MCV: 114.2 fL — ABNORMAL HIGH (ref 80.0–100.0)
Monocytes Absolute: 0.8 K/uL (ref 0.1–1.0)
Monocytes Relative: 23 %
Neutro Abs: 1.6 K/uL — ABNORMAL LOW (ref 1.7–7.7)
Neutrophils Relative %: 50 %
Platelet Count: 251 K/uL (ref 150–400)
RBC: 3.02 MIL/uL — ABNORMAL LOW (ref 3.87–5.11)
RDW: 15.9 % — ABNORMAL HIGH (ref 11.5–15.5)
WBC Count: 3.2 K/uL — ABNORMAL LOW (ref 4.0–10.5)
nRBC: 0 % (ref 0.0–0.2)

## 2020-06-14 LAB — CMP (CANCER CENTER ONLY)
ALT: 13 U/L (ref 0–44)
AST: 20 U/L (ref 15–41)
Albumin: 3.8 g/dL (ref 3.5–5.0)
Alkaline Phosphatase: 64 U/L (ref 38–126)
Anion gap: 10 (ref 5–15)
BUN: 18 mg/dL (ref 6–20)
CO2: 31 mmol/L (ref 22–32)
Calcium: 9.1 mg/dL (ref 8.9–10.3)
Chloride: 96 mmol/L — ABNORMAL LOW (ref 98–111)
Creatinine: 0.83 mg/dL (ref 0.44–1.00)
GFR, Estimated: >60 mL/min (ref >60)
Glucose, Bld: 101 mg/dL — ABNORMAL HIGH (ref 70–99)
Potassium: 4.2 mmol/L (ref 3.5–5.1)
Sodium: 137 mmol/L (ref 135–145)
Total Bilirubin: 0.3 mg/dL (ref 0.3–1.2)
Total Protein: 7.5 g/dL (ref 6.5–8.1)

## 2020-06-14 MED ORDER — PROCHLORPERAZINE MALEATE 10 MG PO TABS
10.0000 mg | ORAL_TABLET | Freq: Once | ORAL | Status: AC
Start: 2020-06-14 — End: 2020-06-14
  Administered 2020-06-14: 10 mg via ORAL

## 2020-06-14 MED ORDER — SODIUM CHLORIDE 0.9 % IV SOLN
Freq: Once | INTRAVENOUS | Status: AC
  Filled 2020-06-14: qty 250

## 2020-06-14 MED ORDER — PEMBROLIZUMAB CHEMO INJECTION 100 MG/4ML
200.0000 mg | Freq: Once | INTRAVENOUS | Status: AC
  Administered 2020-06-14: 200 mg via INTRAVENOUS
  Filled 2020-06-14: qty 8

## 2020-06-14 MED ORDER — PROCHLORPERAZINE MALEATE 10 MG PO TABS
ORAL_TABLET | ORAL | Status: AC
  Filled 2020-06-14: qty 1

## 2020-06-14 MED ORDER — PEMETREXED DISODIUM CHEMO INJECTION 500 MG
400.0000 mg/m2 | Freq: Once | INTRAVENOUS | Status: AC
  Administered 2020-06-14: 700 mg via INTRAVENOUS
  Filled 2020-06-14: qty 20

## 2020-06-14 NOTE — Progress Notes (Signed)
[facility] OFFICE PROGRESS NOTE   Diagnosis: Non-small cell lung cancer  INTERVAL HISTORY:   Gina Cervantes returns as scheduled.  She completed another cycle of Alimta/pembrolizumab 05/21/2020.  She had minimal nausea.  No mouth sores.  No diarrhea.  No rash.  She denies fever, cough, shortness of breath.  No bleeding.  She has a good appetite.  Objective:  Vital signs in last 24 hours:  Blood pressure 113/79, pulse 97, temperature 99.2 F (37.3 C), temperature source Tympanic, resp. rate 18, height '5\' 5"'  (1.651 m), weight 161 lb 11.2 oz (73.3 kg), last menstrual period 04/07/2008, SpO2 98 %.    HEENT: No thrush or ulcers. Resp: Lungs clear bilaterally. Cardio: Regular rate and rhythm. GI: Abdomen soft and nontender.  No hepatomegaly. Vascular: No leg edema.  Skin: No rash.   Lab Results:  Lab Results  Component Value Date   WBC 3.2 (L) 06/14/2020   HGB 11.8 (L) 06/14/2020   HCT 34.5 (L) 06/14/2020   MCV 114.2 (H) 06/14/2020   PLT 251 06/14/2020   NEUTROABS 1.6 (L) 06/14/2020    Imaging:  No results found.  Medications: I have reviewed the patient's current medications.  Assessment/Plan: 1.Anal cancer-left anal margin/anal canal mass, status post a biopsy on 08/09/2012 confirming invasive moderately differentiated squamous cell carcinoma,p16 positive.  -Staging PET scan 08/20/2012-hypermetabolic anal mass, no hypermetabolic metastases  -Initiation of concurrent radiation with cycle 1 of mitomycin C./5-fluorouracil 09/02/2012.  -She completed cycle 2 5-fluorouracil/mitomycin C. beginning 10/01/2012.  -She completed radiation 10/10/2012.  2. Right breast cancer 2009, ER positive, PR positive, HER-2 positive. She is status post neoadjuvant AC x4 cycles followed by Taxol/Herceptin. She underwent a right lumpectomy and sentinel lymph node biopsy 09/22/2008 with no evidence of residual invasive carcinoma and 5 negative sentinel lymph nodes. She then  completed right breast radiation. She began tamoxifen 12/15/2008 and completed adjuvant Herceptin 05/25/2009. The tamoxifen was discontinued and she was switched to Arimidex in July 2013. Arimidex discontinuedin mid July 2019. 3. History of enlargement of the right tonsil.  4. Nonspecific pulmonary nodules without hypermetabolic activity on the PET scan 08/20/2012. Chest CT 10/21/2012 with scattered pulmonary nodules including nodules that were not present in 2010. Annual surveillance was recommended. Follow-up chest CT 10/24/2013 with scattered groundglass densities again noted in both lungs mostly unchanged when compared to the previous study. A sub-solid right lower lobe nodule had increased central density. The size was unchanged.The nodule has been present since 2009.  CT chest 06/13/2016-a sub-solid 9 mm right upper lobe nodule has enlarged compared to 2015, a right lower lobe irregular nodular density appears more well defined, new vague 5 mm groundglass left upper lobe nodule, left lower lobe 10 mm groundglass nodule is more apparent, posterior left upper lobe circumscribed solid nodule measures 11 x 10 mm and is new  PET scan 06/22/2016-malignant range activity involving the 11 mm left upper lobe nodule, no other hypermetabolic nodules, suspicious sub-solid right lower lobe nodule  Wedge resection of a hypermetabolic left upper lobe nodule on 07/05/2016-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 No EGFR, ALK, BRAF, RET, ERBB2, or ROS1 alteration  CT chest 11/14/2016-status post left upper lobe wedge resection, stable right lung nodules  Chest CT 05/02/2017-stable bilateral solid and groundglass nodules, stable mild mediastinal lymphadenopathy  CT chest 12/13/2017-enlargement of right upper lobe nodule, other lung nodules and chest lymph node stable  PET  01/09/2018-right  upper lobe nodule and right lower lobe nodule have enlarged since 2018 and have low level metabolic activity increased size of an 8 mm right upper lobe nodule without increased metabolic activity, stable activity and a mildly enlarged lower paratracheal node, no evidence of metastatic disease in the abdomen or pelvis  CT chest 04/22/2018-no new lung nodules, dominant right lung nodules slightly increased in size  Bronchoscopy/EBUS 05/04/2019 with biopsy of level 7 node, biopsy of right lower lobe nodule, FNA of right lower lobe nodule-negative  CT biopsy of right upper lobe nodule 06/07/2018-adenocarcinoma with lepidic and acinar patterns  SBRT to the dominant right upper lobe nodule 07/23/2018-07/30/2018  CT 09/16/2018-slight increase in size of medial right upper lobe nodule-possibly secondary to interval radiation, other nodules are unchanged  CT 12/25/2018-medial right upper lobe nodule has decreased in size slightly, multiple additional solid and groundglass nodules are stable  CT 06/09/2019-enlarging right lower lobe nodule, enlarging subsolid nodule, new right upper lobe nodule  SBRT to right lower lobe lung nodule, 3 fractions 07/08/2019-07/14/2019  CT 12/05/2019-dominant right lower lobe lesion stable to slightly decreased in size, progressive solid component associated with a dominant right upper lung nodule, mild progression of a left upper lobe nodule, new subsolid anterior medial left upper lobe nodule  Cycle 1 Alimta/carboplatin/pembrolizumab 02/04/2020  Cycle 2 Alimta/carboplatin/pembrolizumab 02/23/2020  Cycle 3 Alimta/carboplatin/pembrolizumab 03/15/2020  Cycle 4 Alimta/carboplatin/pembrolizumab 04/05/2020 (Alimta and carboplatin dose reduced secondary to anemia)  CT chest 04/12/2020-decreased size of solid right lower lobe and right upper lobe nodules, groundglass nodules in the left lung unchanged  Cycle 5 Alimta/pembrolizumab 05/03/2020  Cycle 6  Alimta/pembrolizumab 05/21/2020  Cycle 7 Alimta/pembrolizumab 06/14/2020 5. Baker's cyst left leg, pain and swelling in the left calf, negative Doppler 01/24/2020 and 01/29/2020-evaluated by orthopedics and underwent aspiration of the cyst 01/29/2000, completed a course of Keflex 6. Anemia secondary to chemotherapy, status post 2 units of packed red blood cells on 04/10/2020-improved   Disposition: Ms. Pasquarella appears stable.  She has completed 6 cycles of Alimta/Pembrolizumab and overall seems to be tolerating treatment well.  Plan to proceed with cycle 7 today as scheduled.  Restaging chest CT after cycle 8.  We reviewed the CBC from today.  Counts adequate to proceed with treatment.  Hemoglobin continues to be improved.  She has mild neutropenia.  She understands to contact the office with fever, chills, other signs of infection.  She will return for lab, follow-up, Alimta/pembrolizumab in 3 weeks.  She will contact the office in the interim as outlined above or with any other problems.    Ned Card ANP/GNP-BC   06/14/2020  12:34 PM

## 2020-06-14 NOTE — Patient Instructions (Signed)
[facility] Discharge Instructions for Patients Receiving Chemotherapy  Today you received the following chemotherapy agents: pembrolizumab/pemetrexed.  To help prevent nausea and vomiting after your treatment, we encourage you to take your nausea medication as directed.   If you develop nausea and vomiting that is not controlled by your nausea medication, call the clinic.   BELOW ARE SYMPTOMS THAT SHOULD BE REPORTED IMMEDIATELY:  *FEVER GREATER THAN 100.5 F  *CHILLS WITH OR WITHOUT FEVER  NAUSEA AND VOMITING THAT IS NOT CONTROLLED WITH YOUR NAUSEA MEDICATION  *UNUSUAL SHORTNESS OF BREATH  *UNUSUAL BRUISING OR BLEEDING  TENDERNESS IN MOUTH AND THROAT WITH OR WITHOUT PRESENCE OF ULCERS  *URINARY PROBLEMS  *BOWEL PROBLEMS  UNUSUAL RASH Items with * indicate a potential emergency and should be followed up as soon as possible.  Feel free to call the clinic should you have any questions or concerns. The clinic phone number is [phone].  Please show the [facility] at check-in to the Emergency Department and triage nurse.

## 2020-06-15 ENCOUNTER — Telehealth: Payer: Self-pay | Admitting: Nurse Practitioner

## 2020-06-15 NOTE — Telephone Encounter (Signed)
Scheduled appointments per 2/7 los. Called patient, no answer. Left message with appointments date and times.

## 2020-06-17 ENCOUNTER — Encounter: Payer: Self-pay | Admitting: Oncology

## 2020-06-18 ENCOUNTER — Telehealth: Payer: Self-pay | Admitting: *Deleted

## 2020-06-18 DIAGNOSIS — R52 Pain, unspecified: Secondary | ICD-10-CM | POA: Diagnosis not present

## 2020-06-18 DIAGNOSIS — R0602 Shortness of breath: Secondary | ICD-10-CM | POA: Diagnosis not present

## 2020-06-18 DIAGNOSIS — Z20822 Contact with and (suspected) exposure to covid-19: Secondary | ICD-10-CM | POA: Diagnosis not present

## 2020-06-18 DIAGNOSIS — R059 Cough, unspecified: Secondary | ICD-10-CM | POA: Diagnosis not present

## 2020-06-18 NOTE — Telephone Encounter (Signed)
Per Dr. Benay Spice: She needs to see her PCP or Urgent care and be tested for COVID and Flu. Patient notified and agrees.

## 2020-06-18 NOTE — Telephone Encounter (Addendum)
Patient left Mychart message reporting low grade fever 100.0 w/headache, poor energy, "feels lousy". No nausea. Called home this morning and left VM requesting return call to discuss symptoms in more detail. Nonna called back and reports temp today 99.8. Very tired/weak. Feels like she did the last time she needed transfusion. Feels short of breath walking length of home w/slight cough. Also reports severe headache. She just home tested and was negative for COVID. Informed her that home test negative can still be positive by more sensitive testing by PCR. Her Hgb on Monday was 11.8 and she denies any obvious bleeding source.

## 2020-06-21 ENCOUNTER — Telehealth: Payer: Self-pay | Admitting: *Deleted

## 2020-06-21 NOTE — Telephone Encounter (Signed)
Called to f/u on her status since speaking w/her on Friday. She went to urgent care and tested negative for flu and covid. Is still fatigued but better since the weekend. Breathing is fine. Will f/u on 2/28 as scheduled.

## 2020-07-04 ENCOUNTER — Other Ambulatory Visit: Payer: Self-pay | Admitting: Oncology

## 2020-07-05 ENCOUNTER — Inpatient Hospital Stay: Payer: BC Managed Care – PPO

## 2020-07-05 ENCOUNTER — Other Ambulatory Visit: Payer: Self-pay

## 2020-07-05 ENCOUNTER — Inpatient Hospital Stay (HOSPITAL_BASED_OUTPATIENT_CLINIC_OR_DEPARTMENT_OTHER): Payer: BC Managed Care – PPO | Admitting: Oncology

## 2020-07-05 VITALS — BP 124/75 | HR 101 | Temp 98.7°F | Resp 18 | Ht 65.0 in | Wt 159.7 lb

## 2020-07-05 VITALS — HR 91

## 2020-07-05 DIAGNOSIS — Z85048 Personal history of other malignant neoplasm of rectum, rectosigmoid junction, and anus: Secondary | ICD-10-CM | POA: Diagnosis not present

## 2020-07-05 DIAGNOSIS — C3411 Malignant neoplasm of upper lobe, right bronchus or lung: Secondary | ICD-10-CM | POA: Diagnosis not present

## 2020-07-05 DIAGNOSIS — Z5111 Encounter for antineoplastic chemotherapy: Secondary | ICD-10-CM | POA: Diagnosis not present

## 2020-07-05 DIAGNOSIS — C3412 Malignant neoplasm of upper lobe, left bronchus or lung: Secondary | ICD-10-CM

## 2020-07-05 DIAGNOSIS — D490 Neoplasm of unspecified behavior of digestive system: Secondary | ICD-10-CM | POA: Diagnosis not present

## 2020-07-05 DIAGNOSIS — T451X5A Adverse effect of antineoplastic and immunosuppressive drugs, initial encounter: Secondary | ICD-10-CM | POA: Diagnosis not present

## 2020-07-05 DIAGNOSIS — Z9221 Personal history of antineoplastic chemotherapy: Secondary | ICD-10-CM | POA: Diagnosis not present

## 2020-07-05 DIAGNOSIS — Z853 Personal history of malignant neoplasm of breast: Secondary | ICD-10-CM | POA: Diagnosis not present

## 2020-07-05 DIAGNOSIS — C3431 Malignant neoplasm of lower lobe, right bronchus or lung: Secondary | ICD-10-CM | POA: Diagnosis not present

## 2020-07-05 DIAGNOSIS — Z5112 Encounter for antineoplastic immunotherapy: Secondary | ICD-10-CM | POA: Diagnosis not present

## 2020-07-05 DIAGNOSIS — D6481 Anemia due to antineoplastic chemotherapy: Secondary | ICD-10-CM | POA: Diagnosis not present

## 2020-07-05 DIAGNOSIS — Z923 Personal history of irradiation: Secondary | ICD-10-CM | POA: Diagnosis not present

## 2020-07-05 LAB — CBC WITH DIFFERENTIAL/PLATELET
Abs Immature Granulocytes: 0.03 10*3/uL (ref 0.00–0.07)
Basophils Absolute: 0.1 10*3/uL (ref 0.0–0.1)
Basophils Relative: 1 %
Eosinophils Absolute: 0.1 10*3/uL (ref 0.0–0.5)
Eosinophils Relative: 3 %
HCT: 37.4 % (ref 36.0–46.0)
Hemoglobin: 12.6 g/dL (ref 12.0–15.0)
Immature Granulocytes: 1 %
Lymphocytes Relative: 15 %
Lymphs Abs: 0.8 10*3/uL (ref 0.7–4.0)
MCH: 39 pg — ABNORMAL HIGH (ref 26.0–34.0)
MCHC: 33.7 g/dL (ref 30.0–36.0)
MCV: 115.8 fL — ABNORMAL HIGH (ref 80.0–100.0)
Monocytes Absolute: 0.7 10*3/uL (ref 0.1–1.0)
Monocytes Relative: 13 %
Neutro Abs: 3.7 10*3/uL (ref 1.7–7.7)
Neutrophils Relative %: 67 %
Platelets: 297 10*3/uL (ref 150–400)
RBC: 3.23 MIL/uL — ABNORMAL LOW (ref 3.87–5.11)
RDW: 14.9 % (ref 11.5–15.5)
WBC: 5.4 10*3/uL (ref 4.0–10.5)
nRBC: 0 % (ref 0.0–0.2)

## 2020-07-05 LAB — CMP (CANCER CENTER ONLY)
ALT: 16 U/L (ref 0–44)
AST: 24 U/L (ref 15–41)
Albumin: 3.9 g/dL (ref 3.5–5.0)
Alkaline Phosphatase: 64 U/L (ref 38–126)
Anion gap: 9 (ref 5–15)
BUN: 16 mg/dL (ref 6–20)
CO2: 28 mmol/L (ref 22–32)
Calcium: 9.2 mg/dL (ref 8.9–10.3)
Chloride: 98 mmol/L (ref 98–111)
Creatinine: 0.87 mg/dL (ref 0.44–1.00)
GFR, Estimated: >60 mL/min (ref >60)
Glucose, Bld: 101 mg/dL — ABNORMAL HIGH (ref 70–99)
Potassium: 4.4 mmol/L (ref 3.5–5.1)
Sodium: 135 mmol/L (ref 135–145)
Total Bilirubin: 0.4 mg/dL (ref 0.3–1.2)
Total Protein: 7.6 g/dL (ref 6.5–8.1)

## 2020-07-05 MED ORDER — SODIUM CHLORIDE 0.9 % IV SOLN
Freq: Once | INTRAVENOUS | Status: AC
  Filled 2020-07-05: qty 250

## 2020-07-05 MED ORDER — PROCHLORPERAZINE MALEATE 10 MG PO TABS
10.0000 mg | ORAL_TABLET | Freq: Once | ORAL | Status: AC
  Administered 2020-07-05: 10 mg via ORAL

## 2020-07-05 MED ORDER — SODIUM CHLORIDE 0.9 % IV SOLN
400.0000 mg/m2 | Freq: Once | INTRAVENOUS | Status: AC
  Administered 2020-07-05: 700 mg via INTRAVENOUS
  Filled 2020-07-05: qty 8

## 2020-07-05 MED ORDER — SODIUM CHLORIDE 0.9 % IV SOLN
200.0000 mg | Freq: Once | INTRAVENOUS | Status: AC
  Administered 2020-07-05: 200 mg via INTRAVENOUS
  Filled 2020-07-05: qty 8

## 2020-07-05 MED ORDER — LORAZEPAM 0.5 MG PO TABS: 0.5000 mg | ORAL_TABLET | Freq: Three times a day (TID) | ORAL | 0 refills | Status: DC | PRN

## 2020-07-05 MED ORDER — PROCHLORPERAZINE MALEATE 10 MG PO TABS
ORAL_TABLET | ORAL | Status: AC
  Filled 2020-07-05: qty 1

## 2020-07-05 MED ORDER — ALBUTEROL SULFATE HFA 108 (90 BASE) MCG/ACT IN AERS: INHALATION_SPRAY | RESPIRATORY_TRACT | 2 refills | Status: DC

## 2020-07-05 NOTE — Progress Notes (Signed)
[facility] OFFICE PROGRESS NOTE   Diagnosis: Non-small cell lung cancer  INTERVAL HISTORY:   Gina Cervantes complete another cycle of Alimta/pembrolizumab on 06/14/2020.  No rash or diarrhea.  She reports decreased energy and a low-grade fever a few days following chemotherapy.  She reports having a negative Covid test. She generally feels well at present.  Objective:  Vital signs in last 24 hours:  Blood pressure 124/75, pulse (!) 101, temperature 98.7 F (37.1 C), temperature source Tympanic, resp. rate 18, height '5\' 5"'  (1.651 m), weight 159 lb 11.2 oz (72.4 kg), last menstrual period 04/07/2008, SpO2 94 %.    HEENT: No thrush or ulcers, ecchymosis at the left buccal mucosa Resp: Lungs clear bilaterally Cardio: Regular rate and rhythm GI: No hepatosplenomegaly Vascular: No leg edema   Lab Results:  Lab Results  Component Value Date   WBC 5.4 07/05/2020   HGB 12.6 07/05/2020   HCT 37.4 07/05/2020   MCV 115.8 (H) 07/05/2020   PLT 297 07/05/2020   NEUTROABS 3.7 07/05/2020    CMP  Lab Results  Component Value Date   NA 137 06/14/2020   K 4.2 06/14/2020   CL 96 (L) 06/14/2020   CO2 31 06/14/2020   GLUCOSE 101 (H) 06/14/2020   BUN 18 06/14/2020   CREATININE 0.83 06/14/2020   CALCIUM 9.1 06/14/2020   PROT 7.5 06/14/2020   ALBUMIN 3.8 06/14/2020   AST 20 06/14/2020   ALT 13 06/14/2020   ALKPHOS 64 06/14/2020   BILITOT 0.3 06/14/2020   GFRNONAA >60 06/14/2020   GFRAA >60 01/29/2020     Medications: I have reviewed the patient's current medications.   Assessment/Plan: 1.Anal cancer-left anal margin/anal canal mass, status post a biopsy on 08/09/2012 confirming invasive moderately differentiated squamous cell carcinoma,p16 positive.  -Staging PET scan 08/20/2012-hypermetabolic anal mass, no hypermetabolic metastases  -Initiation of concurrent radiation with cycle 1 of mitomycin C./5-fluorouracil 09/02/2012.  -She completed cycle 2  5-fluorouracil/mitomycin C. beginning 10/01/2012.  -She completed radiation 10/10/2012.  2. Right breast cancer 2009, ER positive, PR positive, HER-2 positive. She is status post neoadjuvant AC x4 cycles followed by Taxol/Herceptin. She underwent a right lumpectomy and sentinel lymph node biopsy 09/22/2008 with no evidence of residual invasive carcinoma and 5 negative sentinel lymph nodes. She then completed right breast radiation. She began tamoxifen 12/15/2008 and completed adjuvant Herceptin 05/25/2009. The tamoxifen was discontinued and she was switched to Arimidex in July 2013. Arimidex discontinuedin mid July 2019. 3. History of enlargement of the right tonsil.  4. Nonspecific pulmonary nodules without hypermetabolic activity on the PET scan 08/20/2012. Chest CT 10/21/2012 with scattered pulmonary nodules including nodules that were not present in 2010. Annual surveillance was recommended. Follow-up chest CT 10/24/2013 with scattered groundglass densities again noted in both lungs mostly unchanged when compared to the previous study. A sub-solid right lower lobe nodule had increased central density. The size was unchanged.The nodule has been present since 2009.  CT chest 06/13/2016-a sub-solid 9 mm right upper lobe nodule has enlarged compared to 2015, a right lower lobe irregular nodular density appears more well defined, new vague 5 mm groundglass left upper lobe nodule, left lower lobe 10 mm groundglass nodule is more apparent, posterior left upper lobe circumscribed solid nodule measures 11 x 10 mm and is new  PET scan 06/22/2016-malignant range activity involving the 11 mm left upper lobe nodule, no other hypermetabolic nodules, suspicious sub-solid right lower lobe nodule  Wedge resection of a hypermetabolic left upper lobe nodule  on 07/05/2016-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 No EGFR, ALK, BRAF, RET, ERBB2, or ROS1 alteration  CT chest 11/14/2016-status post left upper lobe wedge resection, stable right lung nodules  Chest CT 05/02/2017-stable bilateral solid and groundglass nodules, stable mild mediastinal lymphadenopathy  CT chest 12/13/2017-enlargement of right upper lobe nodule, other lung nodules and chest lymph node stable  PET 01/09/2018-right upper lobe nodule and right lower lobe nodule have enlarged since 2018 and have low level metabolic activity increased size of an 8 mm right upper lobe nodule without increased metabolic activity, stable activity and a mildly enlarged lower paratracheal node, no evidence of metastatic disease in the abdomen or pelvis  CT chest 04/22/2018-no new lung nodules, dominant right lung nodules slightly increased in size  Bronchoscopy/EBUS 05/04/2019 with biopsy of level 7 node, biopsy of right lower lobe nodule, FNA of right lower lobe nodule-negative  CT biopsy of right upper lobe nodule 06/07/2018-adenocarcinoma with lepidic and acinar patterns  SBRT to the dominant right upper lobe nodule 07/23/2018-07/30/2018  CT 09/16/2018-slight increase in size of medial right upper lobe nodule-possibly secondary to interval radiation, other nodules are unchanged  CT 12/25/2018-medial right upper lobe nodule has decreased in size slightly, multiple additional solid and groundglass nodules are stable  CT 06/09/2019-enlarging right lower lobe nodule, enlarging subsolid nodule, new right upper lobe nodule  SBRT to right lower lobe lung nodule, 3 fractions 07/08/2019-07/14/2019  CT 12/05/2019-dominant right lower lobe lesion stable to slightly decreased in size, progressive solid component associated with a dominant right upper lung nodule, mild progression of a left upper lobe nodule, new subsolid anterior medial left upper lobe nodule  Cycle 1 Alimta/carboplatin/pembrolizumab 02/04/2020  Cycle 2  Alimta/carboplatin/pembrolizumab 02/23/2020  Cycle 3 Alimta/carboplatin/pembrolizumab 03/15/2020  Cycle 4 Alimta/carboplatin/pembrolizumab 04/05/2020 (Alimta and carboplatin dose reduced secondary to anemia)  CT chest 04/12/2020-decreased size of solid right lower lobe and right upper lobe nodules, groundglass nodules in the left lung unchanged  Cycle 5 Alimta/pembrolizumab 05/03/2020  Cycle 6 Alimta/pembrolizumab 05/21/2020  Cycle 7 Alimta/pembrolizumab 06/14/2020  Cycle 8 Alimta/pembrolizumab 07/05/2020 5. Baker's cyst left leg, pain and swelling in the left calf, negative Doppler 01/24/2020 and 01/29/2020-evaluated by orthopedics and underwent aspiration of the cyst 01/29/2000, completed a course of Keflex 6. Anemia secondary to chemotherapy, status post 2 units of packed red blood cells on 04/10/2020-improved     Disposition: Ms. Dillen appears stable.  The hemoglobin is now in the low normal range.  She will complete another treatment with Alimta/pembrolizumab today.  We discussed the prognosis and treatment plan.  She will undergo a restaging chest CT after this cycle of chemotherapy.  She will return for an office visit with the plan to continue Alimta/pembrolizumab in 3 weeks.  She reports adequate peripheral IV access at present.  She will consider having a Port-A-Cath placement if the decision is to continue Alimta/pembrolizumab after the restaging CT.  Betsy Coder, MD  07/05/2020  10:38 AM

## 2020-07-05 NOTE — Patient Instructions (Signed)
[facility] Discharge Instructions for Patients Receiving Chemotherapy  Today you received the following chemotherapy agents: pembrolizumab/pemetrexed.  To help prevent nausea and vomiting after your treatment, we encourage you to take your nausea medication as directed.   If you develop nausea and vomiting that is not controlled by your nausea medication, call the clinic.   BELOW ARE SYMPTOMS THAT SHOULD BE REPORTED IMMEDIATELY:  *FEVER GREATER THAN 100.5 F  *CHILLS WITH OR WITHOUT FEVER  NAUSEA AND VOMITING THAT IS NOT CONTROLLED WITH YOUR NAUSEA MEDICATION  *UNUSUAL SHORTNESS OF BREATH  *UNUSUAL BRUISING OR BLEEDING  TENDERNESS IN MOUTH AND THROAT WITH OR WITHOUT PRESENCE OF ULCERS  *URINARY PROBLEMS  *BOWEL PROBLEMS  UNUSUAL RASH Items with * indicate a potential emergency and should be followed up as soon as possible.  Feel free to call the clinic should you have any questions or concerns. The clinic phone number is [phone].  Please show the [facility] at check-in to the Emergency Department and triage nurse.

## 2020-07-06 ENCOUNTER — Telehealth: Payer: Self-pay | Admitting: Oncology

## 2020-07-06 NOTE — Telephone Encounter (Signed)
Scheduled appointments per 2/28 los. Called patient, no answer. Left message with appointments dates and times.

## 2020-07-22 ENCOUNTER — Ambulatory Visit
Admission: RE | Admit: 2020-07-22 | Discharge: 2020-07-22 | Disposition: A | Discharge status: Home / Self Care | Payer: BC Managed Care – PPO | Source: Ambulatory Visit | Attending: Oncology | Admitting: Oncology

## 2020-07-22 ENCOUNTER — Other Ambulatory Visit: Payer: BC Managed Care – PPO

## 2020-07-22 DIAGNOSIS — Z853 Personal history of malignant neoplasm of breast: Secondary | ICD-10-CM | POA: Diagnosis not present

## 2020-07-22 DIAGNOSIS — J439 Emphysema, unspecified: Secondary | ICD-10-CM | POA: Diagnosis not present

## 2020-07-22 DIAGNOSIS — C3412 Malignant neoplasm of upper lobe, left bronchus or lung: Secondary | ICD-10-CM

## 2020-07-22 DIAGNOSIS — C3490 Malignant neoplasm of unspecified part of unspecified bronchus or lung: Secondary | ICD-10-CM | POA: Diagnosis not present

## 2020-07-22 DIAGNOSIS — J984 Other disorders of lung: Secondary | ICD-10-CM | POA: Diagnosis not present

## 2020-07-23 ENCOUNTER — Inpatient Hospital Stay: Payer: BC Managed Care – PPO | Attending: Oncology | Admitting: Oncology

## 2020-07-23 ENCOUNTER — Inpatient Hospital Stay: Payer: BC Managed Care – PPO

## 2020-07-23 DIAGNOSIS — C3412 Malignant neoplasm of upper lobe, left bronchus or lung: Secondary | ICD-10-CM

## 2020-07-23 DIAGNOSIS — Z5111 Encounter for antineoplastic chemotherapy: Secondary | ICD-10-CM | POA: Insufficient documentation

## 2020-07-23 DIAGNOSIS — D649 Anemia, unspecified: Secondary | ICD-10-CM | POA: Insufficient documentation

## 2020-07-23 DIAGNOSIS — Z5112 Encounter for antineoplastic immunotherapy: Secondary | ICD-10-CM | POA: Insufficient documentation

## 2020-07-23 DIAGNOSIS — Z853 Personal history of malignant neoplasm of breast: Secondary | ICD-10-CM | POA: Insufficient documentation

## 2020-07-23 DIAGNOSIS — C3431 Malignant neoplasm of lower lobe, right bronchus or lung: Secondary | ICD-10-CM | POA: Insufficient documentation

## 2020-07-23 DIAGNOSIS — Z923 Personal history of irradiation: Secondary | ICD-10-CM | POA: Insufficient documentation

## 2020-07-23 NOTE — Progress Notes (Signed)
[facility] OFFICE VISIT PROGRESS NOTE  I connected with Gina Cervantes on 07/23/20 at  3:30 PM EDT by telephone and verified that I am speaking with the correct person using two identifiers.   I discussed the limitations, risks, security and privacy concerns of performing an evaluation and management service by telemedicine and the availability of in-person appointments. I also discussed with the patient that there may be a patient responsible charge related to this service. The patient expressed understanding and agreed to proceed.    Patient's location: Home Provider's location: Office    Diagnosis: Non-small cell lung cancer  INTERVAL HISTORY:   Gina Cervantes is seen today for a telehealth visit per her request.  She completed another cycle of Alimta/pembrolizumab on 07/05/2020.  She had malaise on the day following chemotherapy.  No nausea, mouth sores, rash, or dyspnea.  She feels well.   Lab Results:  Lab Results  Component Value Date   WBC 5.4 07/05/2020   HGB 12.6 07/05/2020   HCT 37.4 07/05/2020   MCV 115.8 (H) 07/05/2020   PLT 297 07/05/2020   NEUTROABS 3.7 07/05/2020    Imaging:  CT CHEST WO CONTRAST  Result Date: 07/22/2020 CLINICAL DATA:  Non-small cell lung cancer, post radiotherapy, assess treatment response. History of RIGHT breast lumpectomy and prior chemotherapy also with history of prior inguinal cancer. EXAM: CT CHEST WITHOUT CONTRAST TECHNIQUE: Multidetector CT imaging of the chest was performed following the standard protocol without IV contrast. COMPARISON:  April 12, 2020 FINDINGS: Cardiovascular: Calcified atheromatous plaque in the thoracic aorta. Central pulmonary vasculature mildly engorged unchanged from previous imaging. No aneurysmal dilation of the thoracic aorta. Heart size normal without pericardial effusion. Limited assessment of cardiovascular structures given lack of intravenous contrast.  Mediastinum/Nodes: Prominent RIGHT paratracheal lymph node at 8 mm is unchanged and maintains a fatty hilum. No frank adenopathy in the mediastinum, thoracic inlet or axillary regions. Esophagus grossly normal. Lungs/Pleura: Scattered nodularity in the RIGHT upper lobe is similar. Dominant nodule along the major fissure on image 59 of series 5 is stable at approximately 8 mm. Area of treated disease in the RIGHT upper lobe with confluent nodularity stable at approximately 1.9 x 1.5 cm on image 39 of series 5. Adjacent area of nodularity in the treated RIGHT upper lobe is unchanged. Subpleural reticulation and pleural thickening in the anterior RIGHT upper lobe and RIGHT middle lobe related to post treatment changes in the setting of prior breast cancer with similar appearance. Nodule amidst post treatment changes in the RIGHT lower lobe measuring 12 x 12 mm, previously 13 x 13 mm, partially obscured by ground-glass. Ground-glass nodules in the LEFT lower lobe, largest approximately 13 mm is unchanged (image 88, series 5) Post treatment changes of partial lung resection in the LEFT chest with architectural distortion and associated ground-glass is stable. Multifocal ground-glass in the LEFT upper lobe also unchanged largest area measuring approximately 1.6 x 1.0 cm no consolidation. No pleural effusion. Airways are patent. Upper Abdomen: Incidental imaging of upper abdominal contents without acute process. Musculoskeletal: No acute musculoskeletal process. Spinal degenerative changes. IMPRESSION: 1. Stable areas of nodularity and post treatment changes throughout the chest as described. 2. Other areas of multifocal ground-glass nodularity without change, remains suspicious for multifocal adenocarcinoma. 3. No new or progressive findings. 4. Aortic atherosclerosis. Aortic Atherosclerosis (ICD10-I70.0) and Emphysema (ICD10-J43.9). Electronically Signed   By: Zetta Bills M.D.   On: 07/22/2020 16:53    Medications:  I  have reviewed the patient's current medications.  Assessment/Plan: 1.Anal cancer-left anal margin/anal canal mass, status post a biopsy on 08/09/2012 confirming invasive moderately differentiated squamous cell carcinoma,p16 positive.  -Staging PET scan 08/20/2012-hypermetabolic anal mass, no hypermetabolic metastases  -Initiation of concurrent radiation with cycle 1 of mitomycin C./5-fluorouracil 09/02/2012.  -She completed cycle 2 5-fluorouracil/mitomycin C. beginning 10/01/2012.  -She completed radiation 10/10/2012.  2. Right breast cancer 2009, ER positive, PR positive, HER-2 positive. She is status post neoadjuvant AC x4 cycles followed by Taxol/Herceptin. She underwent a right lumpectomy and sentinel lymph node biopsy 09/22/2008 with no evidence of residual invasive carcinoma and 5 negative sentinel lymph nodes. She then completed right breast radiation. She began tamoxifen 12/15/2008 and completed adjuvant Herceptin 05/25/2009. The tamoxifen was discontinued and she was switched to Arimidex in July 2013. Arimidex discontinuedin mid July 2019. 3. History of enlargement of the right tonsil.  4. Nonspecific pulmonary nodules without hypermetabolic activity on the PET scan 08/20/2012. Chest CT 10/21/2012 with scattered pulmonary nodules including nodules that were not present in 2010. Annual surveillance was recommended. Follow-up chest CT 10/24/2013 with scattered groundglass densities again noted in both lungs mostly unchanged when compared to the previous study. A sub-solid right lower lobe nodule had increased central density. The size was unchanged.The nodule has been present since 2009.  CT chest 06/13/2016-a sub-solid 9 mm right upper lobe nodule has enlarged compared to 2015, a right lower lobe irregular nodular density appears more well defined, new vague 5 mm groundglass left upper lobe nodule, left lower lobe 10 mm groundglass nodule is more apparent, posterior left upper lobe  circumscribed solid nodule measures 11 x 10 mm and is new  PET scan 06/22/2016-malignant range activity involving the 11 mm left upper lobe nodule, no other hypermetabolic nodules, suspicious sub-solid right lower lobe nodule  Wedge resection of a hypermetabolic left upper lobe nodule on 07/05/2016-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 No EGFR, ALK, BRAF, RET, ERBB2, or ROS1 alteration  CT chest 11/14/2016-status post left upper lobe wedge resection, stable right lung nodules  Chest CT 05/02/2017-stable bilateral solid and groundglass nodules, stable mild mediastinal lymphadenopathy  CT chest 12/13/2017-enlargement of right upper lobe nodule, other lung nodules and chest lymph node stable  PET 01/09/2018-right upper lobe nodule and right lower lobe nodule have enlarged since 2018 and have low level metabolic activity increased size of an 8 mm right upper lobe nodule without increased metabolic activity, stable activity and a mildly enlarged lower paratracheal node, no evidence of metastatic disease in the abdomen or pelvis  CT chest 04/22/2018-no new lung nodules, dominant right lung nodules slightly increased in size  Bronchoscopy/EBUS 05/04/2019 with biopsy of level 7 node, biopsy of right lower lobe nodule, FNA of right lower lobe nodule-negative  CT biopsy of right upper lobe nodule 06/07/2018-adenocarcinoma with lepidic and acinar patterns  SBRT to the dominant right upper lobe nodule 07/23/2018-07/30/2018  CT 09/16/2018-slight increase in size of medial right upper lobe nodule-possibly secondary to interval radiation, other nodules are unchanged  CT 12/25/2018-medial right upper lobe nodule has decreased in size slightly, multiple additional solid and groundglass nodules are stable  CT 06/09/2019-enlarging right lower lobe nodule, enlarging subsolid nodule, new  right upper lobe nodule  SBRT to right lower lobe lung nodule, 3 fractions 07/08/2019-07/14/2019  CT 12/05/2019-dominant right lower lobe lesion stable to slightly decreased in size, progressive solid component associated with a dominant right upper  lung nodule, mild progression of a left upper lobe nodule, new subsolid anterior medial left upper lobe nodule  Cycle 1 Alimta/carboplatin/pembrolizumab 02/04/2020  Cycle 2 Alimta/carboplatin/pembrolizumab 02/23/2020  Cycle 3 Alimta/carboplatin/pembrolizumab 03/15/2020  Cycle 4 Alimta/carboplatin/pembrolizumab 04/05/2020 (Alimta and carboplatin dose reduced secondary to anemia)  CT chest 04/12/2020-decreased size of solid right lower lobe and right upper lobe nodules, groundglass nodules in the left lung unchanged  Cycle 5 Alimta/pembrolizumab 05/03/2020  Cycle 6 Alimta/pembrolizumab 05/21/2020  Cycle 7 Alimta/pembrolizumab 06/14/2020  Cycle 8 Alimta/pembrolizumab 07/05/2020  CT chest 07/22/2020-stable nodules, no evidence of disease progression, unchanged multifocal groundglass nodules suspicious for multifocal adenocarcinoma. 5. Baker's cyst left leg, pain and swelling in the left calf, negative Doppler 01/24/2020 and 01/29/2020-evaluated by orthopedics and underwent aspiration of the cyst 01/29/2000, completed a course of Keflex 6. Anemia secondary to chemotherapy, status post 2 units of packed red blood cells on 04/10/2020-improved     Disposition: I discussed the restaging CT findings with Ms. Toscano.  I reviewed the CT images.  The CT reveals stable lung nodules and no evidence of disease progression.  I recommend continuing Alimta/pembrolizumab.  She will return for an office visit and the next cycle of Alimta/pembrolizumab on 07/26/2020.   I discussed the assessment and treatment plan with the patient. The patient was provided an opportunity to ask questions and all were answered. The patient agreed with the plan and demonstrated an  understanding of the instructions.   The patient was advised to call back or seek an in-person evaluation if the symptoms worsen or if the condition fails to improve as anticipated.  I provided 15 minutes of telephone, chart review, and documentation time during this encounter, and > 50% was spent counseling as documented under my assessment & plan.  Betsy Coder ANP/GNP-BC   07/23/2020 3:50 PM

## 2020-07-24 ENCOUNTER — Other Ambulatory Visit: Payer: Self-pay | Admitting: Oncology

## 2020-07-26 ENCOUNTER — Inpatient Hospital Stay (HOSPITAL_BASED_OUTPATIENT_CLINIC_OR_DEPARTMENT_OTHER): Payer: BC Managed Care – PPO | Admitting: Nurse Practitioner

## 2020-07-26 ENCOUNTER — Encounter: Payer: Self-pay | Admitting: Nurse Practitioner

## 2020-07-26 ENCOUNTER — Inpatient Hospital Stay: Payer: BC Managed Care – PPO

## 2020-07-26 ENCOUNTER — Other Ambulatory Visit: Payer: Self-pay

## 2020-07-26 VITALS — BP 97/59 | HR 100 | Temp 97.8°F | Resp 18 | Ht 65.0 in | Wt 161.5 lb

## 2020-07-26 VITALS — BP 112/68

## 2020-07-26 DIAGNOSIS — C3412 Malignant neoplasm of upper lobe, left bronchus or lung: Secondary | ICD-10-CM

## 2020-07-26 DIAGNOSIS — Z853 Personal history of malignant neoplasm of breast: Secondary | ICD-10-CM | POA: Diagnosis not present

## 2020-07-26 DIAGNOSIS — Z923 Personal history of irradiation: Secondary | ICD-10-CM | POA: Diagnosis not present

## 2020-07-26 DIAGNOSIS — Z5112 Encounter for antineoplastic immunotherapy: Secondary | ICD-10-CM | POA: Diagnosis not present

## 2020-07-26 DIAGNOSIS — Z5111 Encounter for antineoplastic chemotherapy: Secondary | ICD-10-CM | POA: Diagnosis not present

## 2020-07-26 DIAGNOSIS — D649 Anemia, unspecified: Secondary | ICD-10-CM | POA: Diagnosis not present

## 2020-07-26 DIAGNOSIS — C3431 Malignant neoplasm of lower lobe, right bronchus or lung: Secondary | ICD-10-CM | POA: Diagnosis not present

## 2020-07-26 LAB — CBC WITH DIFFERENTIAL (CANCER CENTER ONLY)
Abs Immature Granulocytes: 0.02 10*3/uL (ref 0.00–0.07)
Basophils Absolute: 0.1 10*3/uL (ref 0.0–0.1)
Basophils Relative: 1 %
Eosinophils Absolute: 0.2 10*3/uL (ref 0.0–0.5)
Eosinophils Relative: 3 %
HCT: 38.7 % (ref 36.0–46.0)
Hemoglobin: 12.8 g/dL (ref 12.0–15.0)
Immature Granulocytes: 0 %
Lymphocytes Relative: 15 %
Lymphs Abs: 0.9 10*3/uL (ref 0.7–4.0)
MCH: 38.2 pg — ABNORMAL HIGH (ref 26.0–34.0)
MCHC: 33.1 g/dL (ref 30.0–36.0)
MCV: 115.5 fL — ABNORMAL HIGH (ref 80.0–100.0)
Monocytes Absolute: 0.7 10*3/uL (ref 0.1–1.0)
Monocytes Relative: 13 %
Neutro Abs: 3.8 10*3/uL (ref 1.7–7.7)
Neutrophils Relative %: 68 %
Platelet Count: 290 10*3/uL (ref 150–400)
RBC: 3.35 MIL/uL — ABNORMAL LOW (ref 3.87–5.11)
RDW: 13.8 % (ref 11.5–15.5)
WBC Count: 5.6 10*3/uL (ref 4.0–10.5)
nRBC: 0 % (ref 0.0–0.2)

## 2020-07-26 LAB — CMP (CANCER CENTER ONLY)
ALT: 15 U/L (ref 0–44)
AST: 20 U/L (ref 15–41)
Albumin: 3.8 g/dL (ref 3.5–5.0)
Alkaline Phosphatase: 67 U/L (ref 38–126)
Anion gap: 8 (ref 5–15)
BUN: 19 mg/dL (ref 6–20)
CO2: 33 mmol/L — ABNORMAL HIGH (ref 22–32)
Calcium: 9.5 mg/dL (ref 8.9–10.3)
Chloride: 96 mmol/L — ABNORMAL LOW (ref 98–111)
Creatinine: 0.87 mg/dL (ref 0.44–1.00)
GFR, Estimated: >60 mL/min (ref >60)
Glucose, Bld: 100 mg/dL — ABNORMAL HIGH (ref 70–99)
Potassium: 4.6 mmol/L (ref 3.5–5.1)
Sodium: 137 mmol/L (ref 135–145)
Total Bilirubin: 0.4 mg/dL (ref 0.3–1.2)
Total Protein: 7.8 g/dL (ref 6.5–8.1)

## 2020-07-26 LAB — TSH: TSH: 3.354 u[IU]/mL (ref 0.308–3.960)

## 2020-07-26 MED ORDER — SODIUM CHLORIDE 0.9 % IV SOLN
Freq: Once | INTRAVENOUS | Status: AC
  Filled 2020-07-26: qty 250

## 2020-07-26 MED ORDER — CYANOCOBALAMIN 1000 MCG/ML IJ SOLN
1000.0000 ug | Freq: Once | INTRAMUSCULAR | Status: AC
  Administered 2020-07-26: 1000 ug via INTRAMUSCULAR

## 2020-07-26 MED ORDER — PROCHLORPERAZINE MALEATE 10 MG PO TABS
ORAL_TABLET | ORAL | Status: AC
  Filled 2020-07-26: qty 1

## 2020-07-26 MED ORDER — CYANOCOBALAMIN 1000 MCG/ML IJ SOLN
INTRAMUSCULAR | Status: AC
  Filled 2020-07-26: qty 1

## 2020-07-26 MED ORDER — PROCHLORPERAZINE MALEATE 10 MG PO TABS
10.0000 mg | ORAL_TABLET | Freq: Once | ORAL | Status: AC
  Administered 2020-07-26: 10 mg via ORAL

## 2020-07-26 MED ORDER — SODIUM CHLORIDE 0.9 % IV SOLN
200.0000 mg | Freq: Once | INTRAVENOUS | Status: AC
  Administered 2020-07-26: 200 mg via INTRAVENOUS
  Filled 2020-07-26: qty 8

## 2020-07-26 MED ORDER — SODIUM CHLORIDE 0.9 % IV SOLN
400.0000 mg/m2 | Freq: Once | INTRAVENOUS | Status: AC
  Administered 2020-07-26: 700 mg via INTRAVENOUS
  Filled 2020-07-26: qty 20

## 2020-07-26 NOTE — Progress Notes (Addendum)
[place] OFFICE PROGRESS NOTE   Diagnosis: Non-small cell lung cancer  INTERVAL HISTORY:   [facility] Cervantes returns as scheduled.  She completed another cycle of Alimta/pembrolizumab 07/05/2020.  She denies nausea/vomiting.  No mouth sores.  No diarrhea.  No rash.  She was active over the weekend, subsequently noted swelling at the left lower leg.  She plans to contact orthopedics.  Objective:  Vital signs in last 24 hours:  Blood pressure (!) 97/59, pulse 100, temperature 97.8 F (36.6 C), temperature source Tympanic, resp. rate 18, height '5\' 5"'  (1.651 m), weight 161 lb 8 oz (73.3 kg), last menstrual period 04/07/2008, SpO2 95 %.    HEENT: No thrush or ulcers. Resp: Lungs clear bilaterally. Cardio: Regular rate and rhythm. GI: Abdomen soft and nontender.  No hepatosplenomegaly. Vascular: Trace edema left lower leg. Skin: No rash.   Lab Results:  Lab Results  Component Value Date   WBC 5.6 07/26/2020   HGB 12.8 07/26/2020   HCT 38.7 07/26/2020   MCV 115.5 (H) 07/26/2020   PLT 290 07/26/2020   NEUTROABS 3.8 07/26/2020    Imaging:  No results found.  Medications: I have reviewed the patient's current medications.  Assessment/Plan: 1.Anal cancer-left anal margin/anal canal mass, status post a biopsy on 08/09/2012 confirming invasive moderately differentiated squamous cell carcinoma,p16 positive.  -Staging PET scan 08/20/2012-hypermetabolic anal mass, no hypermetabolic metastases  -Initiation of concurrent radiation with cycle 1 of mitomycin C./5-fluorouracil 09/02/2012.  -She completed cycle 2 5-fluorouracil/mitomycin C. beginning 10/01/2012.  -She completed radiation 10/10/2012.  2. Right breast cancer 2009, ER positive, PR positive, HER-2 positive. She is status post neoadjuvant AC x4 cycles followed by Taxol/Herceptin. She underwent a right lumpectomy and sentinel lymph node biopsy 09/22/2008 with no evidence of residual invasive carcinoma and 5  negative sentinel lymph nodes. She then completed right breast radiation. She began tamoxifen 12/15/2008 and completed adjuvant Herceptin 05/25/2009. The tamoxifen was discontinued and she was switched to Arimidex in July 2013. Arimidex discontinuedin mid July 2019. 3. History of enlargement of the right tonsil.  4. Nonspecific pulmonary nodules without hypermetabolic activity on the PET scan 08/20/2012. Chest CT 10/21/2012 with scattered pulmonary nodules including nodules that were not present in 2010. Annual surveillance was recommended. Follow-up chest CT 10/24/2013 with scattered groundglass densities again noted in both lungs mostly unchanged when compared to the previous study. A sub-solid right lower lobe nodule had increased central density. The size was unchanged.The nodule has been present since 2009.  CT chest 06/13/2016-a sub-solid 9 mm right upper lobe nodule has enlarged compared to 2015, a right lower lobe irregular nodular density appears more well defined, new vague 5 mm groundglass left upper lobe nodule, left lower lobe 10 mm groundglass nodule is more apparent, posterior left upper lobe circumscribed solid nodule measures 11 x 10 mm and is new  PET scan 06/22/2016-malignant range activity involving the 11 mm left upper lobe nodule, no other hypermetabolic nodules, suspicious sub-solid right lower lobe nodule  Wedge resection of a hypermetabolic left upper lobe nodule on 07/05/2016-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 No EGFR, ALK, BRAF, RET, ERBB2, or ROS1 alteration  CT chest 11/14/2016-status post left upper lobe wedge resection, stable right lung nodules  Chest CT 05/02/2017-stable bilateral solid and groundglass nodules, stable mild mediastinal lymphadenopathy  CT chest 12/13/2017-enlargement of right upper lobe nodule, other lung nodules and  chest lymph node stable  PET  01/09/2018-right upper lobe nodule and right lower lobe nodule have enlarged since 2018 and have low level metabolic activity increased size of an 8 mm right upper lobe nodule without increased metabolic activity, stable activity and a mildly enlarged lower paratracheal node, no evidence of metastatic disease in the abdomen or pelvis  CT chest 04/22/2018-no new lung nodules, dominant right lung nodules slightly increased in size  Bronchoscopy/EBUS 05/04/2019 with biopsy of level 7 node, biopsy of right lower lobe nodule, FNA of right lower lobe nodule-negative  CT biopsy of right upper lobe nodule 06/07/2018-adenocarcinoma with lepidic and acinar patterns  SBRT to the dominant right upper lobe nodule 07/23/2018-07/30/2018  CT 09/16/2018-slight increase in size of medial right upper lobe nodule-possibly secondary to interval radiation, other nodules are unchanged  CT 12/25/2018-medial right upper lobe nodule has decreased in size slightly, multiple additional solid and groundglass nodules are stable  CT 06/09/2019-enlarging right lower lobe nodule, enlarging subsolid nodule, new right upper lobe nodule  SBRT to right lower lobe lung nodule, 3 fractions 07/08/2019-07/14/2019  CT 12/05/2019-dominant right lower lobe lesion stable to slightly decreased in size, progressive solid component associated with a dominant right upper lung nodule, mild progression of a left upper lobe nodule, new subsolid anterior medial left upper lobe nodule  Cycle 1 Alimta/carboplatin/pembrolizumab 02/04/2020  Cycle 2 Alimta/carboplatin/pembrolizumab 02/23/2020  Cycle 3 Alimta/carboplatin/pembrolizumab 03/15/2020  Cycle 4 Alimta/carboplatin/pembrolizumab 04/05/2020 (Alimta and carboplatin dose reduced secondary to anemia)  CT chest 04/12/2020-decreased size of solid right lower lobe and right upper lobe nodules, groundglass nodules in the left lung unchanged  Cycle 5 Alimta/pembrolizumab  05/03/2020  Cycle 6 Alimta/pembrolizumab 05/21/2020  Cycle 7 Alimta/pembrolizumab 06/14/2020  Cycle 8 Alimta/pembrolizumab 07/05/2020  CT chest 07/22/2020-stable nodules, no evidence of disease progression, unchanged multifocal groundglass nodules suspicious for multifocal adenocarcinoma.  Cycle 9 Alimta/Pembrolizumab 07/26/2020 5. Baker's cyst left leg, pain and swelling in the left calf, negative Doppler 01/24/2020 and 01/29/2020-evaluated by orthopedics and underwent aspiration of the cyst 01/29/2000, completed a course of Keflex 6. Anemia secondary to chemotherapy, status post 2 units of packed red blood cells on 04/10/2020-improved  Disposition: Gina Cervantes appears stable.  She has completed 8 cycles of Alimta/Pembrolizumab.  Recent restaging chest CT showed stable lung nodules and no evidence of disease progression.  Results were reviewed with her in a telehealth visit 07/23/2020 and again today.  Dr. Gearldine Shown recommendation is to continue Alimta/Pembrolizumab every 3 weeks.  She agrees with this plan.  We reviewed the CBC from today.  Labs adequate to proceed with treatment.  She will follow-up with orthopedics regarding the left leg edema in setting of a known Baker's cyst.  She will return for lab, follow-up, Alimta/Pembrolizumab in 3 weeks.  We are available to see her sooner if needed.  Patient seen with Dr. Benay Spice.    Ned Card ANP/GNP-BC   07/26/2020  10:31 AM  This was a shared visit with Ned Card.  We discussed the restaging CT findings with her again today.  She did not wish to reviewed the CT images.  The plan is to continue Alimta/pembrolizumab.  She plans to follow-up with orthopedics for evaluation of recurrent left leg edema.  I was present for today's visit.  I performed medical decision making.  Julieanne Manson, MD

## 2020-07-27 ENCOUNTER — Telehealth: Payer: Self-pay | Admitting: Nurse Practitioner

## 2020-07-27 NOTE — Telephone Encounter (Signed)
Scheduled appt per 3/21 [place]  - pt to get an updated schedule next visit.

## 2020-08-04 ENCOUNTER — Other Ambulatory Visit: Payer: Self-pay | Admitting: Oncology

## 2020-08-14 ENCOUNTER — Other Ambulatory Visit: Payer: Self-pay | Admitting: Oncology

## 2020-08-16 ENCOUNTER — Ambulatory Visit: Payer: BC Managed Care – PPO

## 2020-08-16 ENCOUNTER — Encounter: Payer: Self-pay | Admitting: Nurse Practitioner

## 2020-08-16 ENCOUNTER — Ambulatory Visit: Payer: BC Managed Care – PPO | Admitting: Oncology

## 2020-08-16 ENCOUNTER — Inpatient Hospital Stay (HOSPITAL_BASED_OUTPATIENT_CLINIC_OR_DEPARTMENT_OTHER): Payer: BC Managed Care – PPO | Admitting: Nurse Practitioner

## 2020-08-16 ENCOUNTER — Inpatient Hospital Stay: Payer: BC Managed Care – PPO

## 2020-08-16 ENCOUNTER — Other Ambulatory Visit: Payer: BC Managed Care – PPO

## 2020-08-16 ENCOUNTER — Other Ambulatory Visit: Payer: Self-pay

## 2020-08-16 ENCOUNTER — Inpatient Hospital Stay: Payer: BC Managed Care – PPO | Attending: Oncology

## 2020-08-16 VITALS — BP 105/60 | HR 99 | Temp 98.9°F | Resp 16 | Ht 65.0 in | Wt 161.8 lb

## 2020-08-16 DIAGNOSIS — C3431 Malignant neoplasm of lower lobe, right bronchus or lung: Secondary | ICD-10-CM | POA: Insufficient documentation

## 2020-08-16 DIAGNOSIS — C3412 Malignant neoplasm of upper lobe, left bronchus or lung: Secondary | ICD-10-CM | POA: Diagnosis not present

## 2020-08-16 DIAGNOSIS — D6481 Anemia due to antineoplastic chemotherapy: Secondary | ICD-10-CM | POA: Insufficient documentation

## 2020-08-16 DIAGNOSIS — Z5111 Encounter for antineoplastic chemotherapy: Secondary | ICD-10-CM | POA: Diagnosis not present

## 2020-08-16 DIAGNOSIS — Z5112 Encounter for antineoplastic immunotherapy: Secondary | ICD-10-CM | POA: Diagnosis not present

## 2020-08-16 DIAGNOSIS — Z853 Personal history of malignant neoplasm of breast: Secondary | ICD-10-CM | POA: Insufficient documentation

## 2020-08-16 DIAGNOSIS — Z923 Personal history of irradiation: Secondary | ICD-10-CM | POA: Diagnosis not present

## 2020-08-16 LAB — CBC WITH DIFFERENTIAL (CANCER CENTER ONLY)
Abs Immature Granulocytes: 0.02 10*3/uL (ref 0.00–0.07)
Basophils Absolute: 0.1 10*3/uL (ref 0.0–0.1)
Basophils Relative: 1 %
Eosinophils Absolute: 0.2 10*3/uL (ref 0.0–0.5)
Eosinophils Relative: 3 %
HCT: 41.9 % (ref 36.0–46.0)
Hemoglobin: 14 g/dL (ref 12.0–15.0)
Immature Granulocytes: 0 %
Lymphocytes Relative: 14 %
Lymphs Abs: 0.8 10*3/uL (ref 0.7–4.0)
MCH: 38.8 pg — ABNORMAL HIGH (ref 26.0–34.0)
MCHC: 33.4 g/dL (ref 30.0–36.0)
MCV: 116.1 fL — ABNORMAL HIGH (ref 80.0–100.0)
Monocytes Absolute: 0.7 10*3/uL (ref 0.1–1.0)
Monocytes Relative: 13 %
Neutro Abs: 3.6 10*3/uL (ref 1.7–7.7)
Neutrophils Relative %: 69 %
Platelet Count: 291 10*3/uL (ref 150–400)
RBC: 3.61 MIL/uL — ABNORMAL LOW (ref 3.87–5.11)
RDW: 14.4 % (ref 11.5–15.5)
WBC Count: 5.4 10*3/uL (ref 4.0–10.5)
nRBC: 0 % (ref 0.0–0.2)

## 2020-08-16 LAB — CMP (CANCER CENTER ONLY)
ALT: 9 U/L (ref 0–44)
AST: 18 U/L (ref 15–41)
Albumin: 4 g/dL (ref 3.5–5.0)
Alkaline Phosphatase: 60 U/L (ref 38–126)
Anion gap: 8 (ref 5–15)
BUN: 17 mg/dL (ref 6–20)
CO2: 31 mmol/L (ref 22–32)
Calcium: 9.2 mg/dL (ref 8.9–10.3)
Chloride: 99 mmol/L (ref 98–111)
Creatinine: 0.83 mg/dL (ref 0.44–1.00)
GFR, Estimated: >60 mL/min (ref >60)
Glucose, Bld: 134 mg/dL — ABNORMAL HIGH (ref 70–99)
Potassium: 4.6 mmol/L (ref 3.5–5.1)
Sodium: 138 mmol/L (ref 135–145)
Total Bilirubin: 0.5 mg/dL (ref 0.3–1.2)
Total Protein: 7.3 g/dL (ref 6.5–8.1)

## 2020-08-16 MED ORDER — SODIUM CHLORIDE 0.9 % IV SOLN
400.0000 mg/m2 | Freq: Once | INTRAVENOUS | Status: AC
  Administered 2020-08-16: 700 mg via INTRAVENOUS
  Filled 2020-08-16: qty 20

## 2020-08-16 MED ORDER — SODIUM CHLORIDE 0.9 % IV SOLN
Freq: Once | INTRAVENOUS | Status: AC
Start: 2020-08-16 — End: 2020-08-16
  Filled 2020-08-16: qty 250

## 2020-08-16 MED ORDER — SODIUM CHLORIDE 0.9 % IV SOLN
200.0000 mg | Freq: Once | INTRAVENOUS | Status: AC
  Administered 2020-08-16: 200 mg via INTRAVENOUS
  Filled 2020-08-16: qty 8

## 2020-08-16 MED ORDER — PROCHLORPERAZINE MALEATE 10 MG PO TABS
10.0000 mg | ORAL_TABLET | Freq: Once | ORAL | Status: AC
  Administered 2020-08-16: 10 mg via ORAL
  Filled 2020-08-16: qty 1

## 2020-08-16 NOTE — Patient Instructions (Signed)
[facility] Discharge Instructions for Patients Receiving Chemotherapy  Today you received the following chemotherapy agents Pembrolizumab(KEYTRUDA) & Pemetrexed (ALIMTA).  To help prevent nausea and vomiting after your treatment, we encourage you to take your nausea medication as prescribed.   If you develop nausea and vomiting that is not controlled by your nausea medication, call the clinic.   BELOW ARE SYMPTOMS THAT SHOULD BE REPORTED IMMEDIATELY:  *FEVER GREATER THAN 100.5 F  *CHILLS WITH OR WITHOUT FEVER  NAUSEA AND VOMITING THAT IS NOT CONTROLLED WITH YOUR NAUSEA MEDICATION  *UNUSUAL SHORTNESS OF BREATH  *UNUSUAL BRUISING OR BLEEDING  TENDERNESS IN MOUTH AND THROAT WITH OR WITHOUT PRESENCE OF ULCERS  *URINARY PROBLEMS  *BOWEL PROBLEMS  UNUSUAL RASH Items with * indicate a potential emergency and should be followed up as soon as possible.  Feel free to call the clinic should you have any questions or concerns at The clinic phone number is [phone].  Please show the [facility] at check-in to the Emergency Department and triage nurse.

## 2020-08-16 NOTE — Progress Notes (Signed)
[facility] OFFICE PROGRESS NOTE   Diagnosis: Non-small cell lung cancer  INTERVAL HISTORY:   Ms. Julin returns as scheduled.  She completed another cycle of Alimta/Pembrolizumab 07/26/2020.  She denies nausea/vomiting.  No mouth sores.  No diarrhea.  No rash.  She does note some dry skin.  Left leg swelling is better.  She denies fever, cough, shortness of breath.  Objective:  Vital signs in last 24 hours:  Blood pressure 105/60, pulse 99, temperature 98.9 F (37.2 C), temperature source Oral, resp. rate 16, height _0  (1.651 m), weight 161 lb 12.8 oz (73.4 kg), last menstrual period 04/07/2008, SpO2 99 %.    HEENT: No thrush or ulcers. Resp: Lungs clear bilaterally. Cardio: Regular rate and rhythm. GI: Abdomen soft and nontender.  No hepatomegaly. Vascular: Trace edema left lower leg. Skin: No rash.   Lab Results:  Lab Results  Component Value Date   WBC 5.4 08/16/2020   HGB 14.0 08/16/2020   HCT 41.9 08/16/2020   MCV 116.1 (H) 08/16/2020   PLT 291 08/16/2020   NEUTROABS 3.6 08/16/2020    Imaging:  No results found.  Medications: I have reviewed the patient's current medications.  Assessment/Plan: 1.Anal cancer-left anal margin/anal canal mass, status post a biopsy on 08/09/2012 confirming invasive moderately differentiated squamous cell carcinoma,p16 positive.  -Staging PET scan 08/20/2012-hypermetabolic anal mass, no hypermetabolic metastases  -Initiation of concurrent radiation with cycle 1 of mitomycin C./5-fluorouracil 09/02/2012.  -She completed cycle 2 5-fluorouracil/mitomycin C. beginning 10/01/2012.  -She completed radiation 10/10/2012.  2. Right breast cancer 2009, ER positive, PR positive, HER-2 positive. She is status post neoadjuvant AC x4 cycles followed by Taxol/Herceptin. She underwent a right lumpectomy and sentinel lymph node biopsy 09/22/2008 with no evidence of residual invasive carcinoma and 5 negative sentinel lymph  nodes. She then completed right breast radiation. She began tamoxifen 12/15/2008 and completed adjuvant Herceptin 05/25/2009. The tamoxifen was discontinued and she was switched to Arimidex in July 2013. Arimidex discontinuedin mid July 2019. 3. History of enlargement of the right tonsil.  4. Nonspecific pulmonary nodules without hypermetabolic activity on the PET scan 08/20/2012. Chest CT 10/21/2012 with scattered pulmonary nodules including nodules that were not present in 2010. Annual surveillance was recommended. Follow-up chest CT 10/24/2013 with scattered groundglass densities again noted in both lungs mostly unchanged when compared to the previous study. A sub-solid right lower lobe nodule had increased central density. The size was unchanged.The nodule has been present since 2009.  CT chest 06/13/2016-a sub-solid 9 mm right upper lobe nodule has enlarged compared to 2015, a right lower lobe irregular nodular density appears more well defined, new vague 5 mm groundglass left upper lobe nodule, left lower lobe 10 mm groundglass nodule is more apparent, posterior left upper lobe circumscribed solid nodule measures 11 x 10 mm and is new  PET scan 06/22/2016-malignant range activity involving the 11 mm left upper lobe nodule, no other hypermetabolic nodules, suspicious sub-solid right lower lobe nodule  Wedge resection of a hypermetabolic left upper lobe nodule on 07/05/2016-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 No EGFR, ALK, BRAF, RET, ERBB2, or ROS1 alteration  CT chest 11/14/2016-status post left upper lobe wedge resection, stable right lung nodules  Chest CT 05/02/2017-stable bilateral solid and groundglass nodules, stable mild mediastinal lymphadenopathy  CT chest 12/13/2017-enlargement of right upper lobe nodule, other lung nodules and chest lymph node  stable  PET 01/09/2018-right  upper lobe nodule and right lower lobe nodule have enlarged since 2018 and have low level metabolic activity increased size of an 8 mm right upper lobe nodule without increased metabolic activity, stable activity and a mildly enlarged lower paratracheal node, no evidence of metastatic disease in the abdomen or pelvis  CT chest 04/22/2018-no new lung nodules, dominant right lung nodules slightly increased in size  Bronchoscopy/EBUS 05/04/2019 with biopsy of level 7 node, biopsy of right lower lobe nodule, FNA of right lower lobe nodule-negative  CT biopsy of right upper lobe nodule 06/07/2018-adenocarcinoma with lepidic and acinar patterns  SBRT to the dominant right upper lobe nodule 07/23/2018-07/30/2018  CT 09/16/2018-slight increase in size of medial right upper lobe nodule-possibly secondary to interval radiation, other nodules are unchanged  CT 12/25/2018-medial right upper lobe nodule has decreased in size slightly, multiple additional solid and groundglass nodules are stable  CT 06/09/2019-enlarging right lower lobe nodule, enlarging subsolid nodule, new right upper lobe nodule  SBRT to right lower lobe lung nodule, 3 fractions 07/08/2019-07/14/2019  CT 12/05/2019-dominant right lower lobe lesion stable to slightly decreased in size, progressive solid component associated with a dominant right upper lung nodule, mild progression of a left upper lobe nodule, new subsolid anterior medial left upper lobe nodule  Cycle 1 Alimta/carboplatin/pembrolizumab 02/04/2020  Cycle 2 Alimta/carboplatin/pembrolizumab 02/23/2020  Cycle 3 Alimta/carboplatin/pembrolizumab 03/15/2020  Cycle 4 Alimta/carboplatin/pembrolizumab 04/05/2020 (Alimta and carboplatin dose reduced secondary to anemia)  CT chest 04/12/2020-decreased size of solid right lower lobe and right upper lobe nodules, groundglass nodules in the left lung unchanged  Cycle 5 Alimta/pembrolizumab 05/03/2020  Cycle 6  Alimta/pembrolizumab 05/21/2020  Cycle 7 Alimta/pembrolizumab 06/14/2020  Cycle 8 Alimta/pembrolizumab 07/05/2020  CT chest 07/22/2020-stable nodules, no evidence of disease progression, unchanged multifocal groundglass nodules suspicious for multifocal adenocarcinoma.  Cycle 9 Alimta/Pembrolizumab 07/26/2020  Cycle 10 Alimta/Pembrolizumab 08/16/2020 5. Baker's cyst left leg, pain and swelling in the left calf, negative Doppler 01/24/2020 and 01/29/2020-evaluated by orthopedics and underwent aspiration of the cyst 01/29/2000, completed a course of Keflex 6. Anemia secondary to chemotherapy, status post 2 units of packed red blood cells on 04/10/2020-improved   Disposition: Gina Cervantes appears well.  She has completed 10 cycles of Alimta/Pembrolizumab.  There is no clinical evidence of disease progression.  Plan to proceed with cycle 11 today as scheduled.  We reviewed the CBC and chemistry panel from today.  Labs adequate to proceed with treatment.  She will return for lab, follow-up, Alimta/Pembrolizumab in 3 weeks.    Ned Card ANP/GNP-BC   08/16/2020  1:42 PM

## 2020-09-05 ENCOUNTER — Other Ambulatory Visit: Payer: Self-pay | Admitting: Oncology

## 2020-09-06 ENCOUNTER — Inpatient Hospital Stay: Payer: BC Managed Care – PPO

## 2020-09-06 ENCOUNTER — Inpatient Hospital Stay: Payer: BC Managed Care – PPO | Attending: Oncology

## 2020-09-06 ENCOUNTER — Inpatient Hospital Stay (HOSPITAL_BASED_OUTPATIENT_CLINIC_OR_DEPARTMENT_OTHER): Payer: BC Managed Care – PPO | Admitting: Nurse Practitioner

## 2020-09-06 ENCOUNTER — Encounter: Payer: Self-pay | Admitting: Nurse Practitioner

## 2020-09-06 ENCOUNTER — Other Ambulatory Visit: Payer: Self-pay

## 2020-09-06 VITALS — BP 114/73 | HR 95 | Temp 98.1°F | Resp 18 | Ht 65.0 in | Wt 161.8 lb

## 2020-09-06 DIAGNOSIS — Z79899 Other long term (current) drug therapy: Secondary | ICD-10-CM | POA: Diagnosis not present

## 2020-09-06 DIAGNOSIS — Z5112 Encounter for antineoplastic immunotherapy: Secondary | ICD-10-CM | POA: Diagnosis not present

## 2020-09-06 DIAGNOSIS — Z853 Personal history of malignant neoplasm of breast: Secondary | ICD-10-CM | POA: Insufficient documentation

## 2020-09-06 DIAGNOSIS — Z5111 Encounter for antineoplastic chemotherapy: Secondary | ICD-10-CM | POA: Diagnosis not present

## 2020-09-06 DIAGNOSIS — C3412 Malignant neoplasm of upper lobe, left bronchus or lung: Secondary | ICD-10-CM | POA: Diagnosis not present

## 2020-09-06 DIAGNOSIS — D6481 Anemia due to antineoplastic chemotherapy: Secondary | ICD-10-CM | POA: Insufficient documentation

## 2020-09-06 DIAGNOSIS — Z923 Personal history of irradiation: Secondary | ICD-10-CM | POA: Insufficient documentation

## 2020-09-06 DIAGNOSIS — C3431 Malignant neoplasm of lower lobe, right bronchus or lung: Secondary | ICD-10-CM | POA: Insufficient documentation

## 2020-09-06 LAB — CBC WITH DIFFERENTIAL (CANCER CENTER ONLY)
Abs Immature Granulocytes: 0.02 10*3/uL (ref 0.00–0.07)
Basophils Absolute: 0.1 10*3/uL (ref 0.0–0.1)
Basophils Relative: 2 %
Eosinophils Absolute: 0.3 10*3/uL (ref 0.0–0.5)
Eosinophils Relative: 5 %
HCT: 43.7 % (ref 36.0–46.0)
Hemoglobin: 14.4 g/dL (ref 12.0–15.0)
Immature Granulocytes: 0 %
Lymphocytes Relative: 13 %
Lymphs Abs: 0.8 10*3/uL (ref 0.7–4.0)
MCH: 37.9 pg — ABNORMAL HIGH (ref 26.0–34.0)
MCHC: 33 g/dL (ref 30.0–36.0)
MCV: 115 fL — ABNORMAL HIGH (ref 80.0–100.0)
Monocytes Absolute: 0.7 10*3/uL (ref 0.1–1.0)
Monocytes Relative: 12 %
Neutro Abs: 4 10*3/uL (ref 1.7–7.7)
Neutrophils Relative %: 68 %
Platelet Count: 286 10*3/uL (ref 150–400)
RBC: 3.8 MIL/uL — ABNORMAL LOW (ref 3.87–5.11)
RDW: 14.1 % (ref 11.5–15.5)
WBC Count: 6 10*3/uL (ref 4.0–10.5)
nRBC: 0 % (ref 0.0–0.2)

## 2020-09-06 LAB — CMP (CANCER CENTER ONLY)
ALT: 13 U/L (ref 0–44)
AST: 20 U/L (ref 15–41)
Albumin: 4.2 g/dL (ref 3.5–5.0)
Alkaline Phosphatase: 59 U/L (ref 38–126)
Anion gap: 7 (ref 5–15)
BUN: 17 mg/dL (ref 6–20)
CO2: 34 mmol/L — ABNORMAL HIGH (ref 22–32)
Calcium: 9.6 mg/dL (ref 8.9–10.3)
Chloride: 93 mmol/L — ABNORMAL LOW (ref 98–111)
Creatinine: 0.82 mg/dL (ref 0.44–1.00)
GFR, Estimated: >60 mL/min (ref >60)
Glucose, Bld: 99 mg/dL (ref 70–99)
Potassium: 4.2 mmol/L (ref 3.5–5.1)
Sodium: 134 mmol/L — ABNORMAL LOW (ref 135–145)
Total Bilirubin: 0.5 mg/dL (ref 0.3–1.2)
Total Protein: 7.5 g/dL (ref 6.5–8.1)

## 2020-09-06 LAB — TSH: TSH: 1.982 u[IU]/mL (ref 0.350–4.500)

## 2020-09-06 MED ORDER — SODIUM CHLORIDE 0.9 % IV SOLN
Freq: Once | INTRAVENOUS | Status: AC
Start: 2020-09-06 — End: 2020-09-06
  Filled 2020-09-06: qty 250

## 2020-09-06 MED ORDER — SODIUM CHLORIDE 0.9 % IV SOLN
Freq: Once | INTRAVENOUS | Status: DC
  Filled 2020-09-06: qty 250

## 2020-09-06 MED ORDER — PROCHLORPERAZINE MALEATE 10 MG PO TABS
10.0000 mg | ORAL_TABLET | Freq: Once | ORAL | Status: AC
  Administered 2020-09-06: 10 mg via ORAL
  Filled 2020-09-06: qty 1

## 2020-09-06 MED ORDER — SODIUM CHLORIDE 0.9 % IV SOLN
200.0000 mg | Freq: Once | INTRAVENOUS | Status: AC
  Administered 2020-09-06: 200 mg via INTRAVENOUS
  Filled 2020-09-06: qty 8

## 2020-09-06 MED ORDER — SODIUM CHLORIDE 0.9 % IV SOLN
400.0000 mg/m2 | Freq: Once | INTRAVENOUS | Status: AC
  Administered 2020-09-06: 700 mg via INTRAVENOUS
  Filled 2020-09-06: qty 8

## 2020-09-06 NOTE — Progress Notes (Signed)
[facility] OFFICE PROGRESS NOTE   Diagnosis: Non-small cell lung cancer  INTERVAL HISTORY:   Gina Cervantes returns as scheduled.  She completed another cycle of Alimta/Pembrolizumab 08/16/2020.  Nausea/vomiting.  No mouth sores.  No diarrhea.  No rash.  Main complaint is related to allergies.  She recently began Flonase with significant improvement.  Objective:  Vital signs in last 24 hours:  Blood pressure 114/73, pulse 95, temperature 98.1 F (36.7 C), temperature source Oral, resp. rate 18, height '5\' 5"'  (1.651 m), weight 161 lb 12.8 oz (73.4 kg), last menstrual period 04/07/2008, SpO2 97 %.    HEENT: No thrush or ulcers. Resp: Lungs clear bilaterally. Cardio: Regular rate and rhythm. GI: Abdomen soft and nontender.  No hepatomegaly. Vascular: No leg edema.  Left lower leg is larger than the right lower leg.  Skin: No rash.   Lab Results:  Lab Results  Component Value Date   WBC 6.0 09/06/2020   HGB 14.4 09/06/2020   HCT 43.7 09/06/2020   MCV 115.0 (H) 09/06/2020   PLT 286 09/06/2020   NEUTROABS 4.0 09/06/2020    Imaging:  No results found.  Medications: I have reviewed the patient's current medications.  Assessment/Plan: 1.Anal cancer-left anal margin/anal canal mass, status post a biopsy on 08/09/2012 confirming invasive moderately differentiated squamous cell carcinoma,p16 positive.  -Staging PET scan 08/20/2012-hypermetabolic anal mass, no hypermetabolic metastases  -Initiation of concurrent radiation with cycle 1 of mitomycin C./5-fluorouracil 09/02/2012.  -She completed cycle 2 5-fluorouracil/mitomycin C. beginning 10/01/2012.  -She completed radiation 10/10/2012.  2. Right breast cancer 2009, ER positive, PR positive, HER-2 positive. She is status post neoadjuvant AC x4 cycles followed by Taxol/Herceptin. She underwent a right lumpectomy and sentinel lymph node biopsy 09/22/2008 with no evidence of residual invasive carcinoma and 5 negative  sentinel lymph nodes. She then completed right breast radiation. She began tamoxifen 12/15/2008 and completed adjuvant Herceptin 05/25/2009. The tamoxifen was discontinued and she was switched to Arimidex in July 2013. Arimidex discontinuedin mid July 2019. 3. History of enlargement of the right tonsil.  4. Nonspecific pulmonary nodules without hypermetabolic activity on the PET scan 08/20/2012. Chest CT 10/21/2012 with scattered pulmonary nodules including nodules that were not present in 2010. Annual surveillance was recommended. Follow-up chest CT 10/24/2013 with scattered groundglass densities again noted in both lungs mostly unchanged when compared to the previous study. A sub-solid right lower lobe nodule had increased central density. The size was unchanged.The nodule has been present since 2009.  CT chest 06/13/2016-a sub-solid 9 mm right upper lobe nodule has enlarged compared to 2015, a right lower lobe irregular nodular density appears more well defined, new vague 5 mm groundglass left upper lobe nodule, left lower lobe 10 mm groundglass nodule is more apparent, posterior left upper lobe circumscribed solid nodule measures 11 x 10 mm and is new  PET scan 06/22/2016-malignant range activity involving the 11 mm left upper lobe nodule, no other hypermetabolic nodules, suspicious sub-solid right lower lobe nodule  Wedge resection of a hypermetabolic left upper lobe nodule on 07/05/2016-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 No EGFR, ALK, BRAF, RET, ERBB2, or ROS1 alteration  CT chest 11/14/2016-status post left upper lobe wedge resection, stable right lung nodules  Chest CT 05/02/2017-stable bilateral solid and groundglass nodules, stable mild mediastinal lymphadenopathy  CT chest 12/13/2017-enlargement of right upper lobe nodule, other lung nodules and chest lymph  node stable  PET 01/09/2018-right upper lobe nodule and right lower lobe nodule have enlarged since 2018 and have low level metabolic activity increased size of an 8 mm right upper lobe nodule without increased metabolic activity, stable activity and a mildly enlarged lower paratracheal node, no evidence of metastatic disease in the abdomen or pelvis  CT chest 04/22/2018-no new lung nodules, dominant right lung nodules slightly increased in size  Bronchoscopy/EBUS 05/04/2019 with biopsy of level 7 node, biopsy of right lower lobe nodule, FNA of right lower lobe nodule-negative  CT biopsy of right upper lobe nodule 06/07/2018-adenocarcinoma with lepidic and acinar patterns  SBRT to the dominant right upper lobe nodule 07/23/2018-07/30/2018  CT 09/16/2018-slight increase in size of medial right upper lobe nodule-possibly secondary to interval radiation, other nodules are unchanged  CT 12/25/2018-medial right upper lobe nodule has decreased in size slightly, multiple additional solid and groundglass nodules are stable  CT 06/09/2019-enlarging right lower lobe nodule, enlarging subsolid nodule, new right upper lobe nodule  SBRT to right lower lobe lung nodule, 3 fractions 07/08/2019-07/14/2019  CT 12/05/2019-dominant right lower lobe lesion stable to slightly decreased in size, progressive solid component associated with a dominant right upper lung nodule, mild progression of a left upper lobe nodule, new subsolid anterior medial left upper lobe nodule  Cycle 1 Alimta/carboplatin/pembrolizumab 02/04/2020  Cycle 2 Alimta/carboplatin/pembrolizumab 02/23/2020  Cycle 3 Alimta/carboplatin/pembrolizumab 03/15/2020  Cycle 4 Alimta/carboplatin/pembrolizumab 04/05/2020 (Alimta and carboplatin dose reduced secondary to anemia)  CT chest 04/12/2020-decreased size of solid right lower lobe and right upper lobe nodules, groundglass nodules in the left lung unchanged  Cycle 5 Alimta/pembrolizumab  05/03/2020  Cycle 6 Alimta/pembrolizumab 05/21/2020  Cycle 7 Alimta/pembrolizumab 06/14/2020  Cycle 8 Alimta/pembrolizumab 07/05/2020  CT chest 07/22/2020-stable nodules, no evidence of disease progression, unchanged multifocal groundglass nodules suspicious for multifocal adenocarcinoma.  Cycle 9 Alimta/Pembrolizumab 07/26/2020  Cycle 10 Alimta/Pembrolizumab 08/16/2020  Cycle 11 Alimta/Pembrolizumab 09/06/2020 5. Baker's cyst left leg, pain and swelling in the left calf, negative Doppler 01/24/2020 and 01/29/2020-evaluated by orthopedics and underwent aspiration of the cyst 01/29/2000, completed a course of Keflex 6. Anemia secondary to chemotherapy, status post 2 units of packed red blood cells on 04/10/2020-improved    Disposition: Ms. Cervantes appears stable.  She has completed 10 cycles of Alimta/Pembrolizumab.  She continues to tolerate treatment well.  Plan to proceed with cycle 11 today as scheduled.  We reviewed the CBC and chemistry panel from today.  Labs adequate to proceed with treatment.  She will return for lab, follow-up, cycle 12 Alimta/Pembrolizumab in 3 weeks.  She will contact the office in the interim with any problems.    Ned Card ANP/GNP-BC   09/06/2020  11:35 AM

## 2020-09-06 NOTE — Patient Instructions (Signed)
[facility]  Discharge Instructions: Thank you for choosing [facility] to provide your oncology and hematology care.   If you have a lab appointment with the [facility], please go directly to the [facility] and check in at the registration area.   Wear comfortable clothing and clothing appropriate for easy access to any Portacath or PICC line.   We strive to give you quality time with your provider. You may need to reschedule your appointment if you arrive late (15 or more minutes).  Arriving late affects you and other patients whose appointments are after yours.  Also, if you miss three or more appointments without notifying the office, you may be dismissed from the clinic at the provider's discretion.      For prescription refill requests, have your pharmacy contact our office and allow 72 hours for refills to be completed.    Today you received the following chemotherapy and/or immunotherapy agents Keytruda and Pemetrexed     To help prevent nausea and vomiting after your treatment, we encourage you to take your nausea medication as directed.  BELOW ARE SYMPTOMS THAT SHOULD BE REPORTED IMMEDIATELY: . *FEVER GREATER THAN 100.4 F (38 C) OR HIGHER . *CHILLS OR SWEATING . *NAUSEA AND VOMITING THAT IS NOT CONTROLLED WITH YOUR NAUSEA MEDICATION . *UNUSUAL SHORTNESS OF BREATH . *UNUSUAL BRUISING OR BLEEDING . *URINARY PROBLEMS (pain or burning when urinating, or frequent urination) . *BOWEL PROBLEMS (unusual diarrhea, constipation, pain near the anus) . TENDERNESS IN MOUTH AND THROAT WITH OR WITHOUT PRESENCE OF ULCERS (sore throat, sores in mouth, or a toothache) . UNUSUAL RASH, SWELLING OR PAIN  . UNUSUAL VAGINAL DISCHARGE OR ITCHING   Items with * indicate a potential emergency and should be followed up as soon as possible or go to the Emergency Department if any problems should occur.  Please show the CHEMOTHERAPY ALERT CARD or  IMMUNOTHERAPY ALERT CARD at check-in to the Emergency Department and triage nurse.  Should you have questions after your visit or need to cancel or reschedule your appointment, please contact [facility]  Dept: [phone]  and follow the prompts.  Office hours are 8:00 a.m. to 4:30 p.m. Monday - Friday. Please note that voicemails left after 4:00 p.m. may not be returned until the following business day.  We are closed weekends and major holidays. You have access to a nurse at all times for urgent questions. Please call the main number to the clinic Dept: [phone] and follow the prompts.   For any non-urgent questions, you may also contact your provider using MyChart. We now offer e-Visits for anyone 3 and older to request care online for non-urgent symptoms. For details visit [email or web address].   Also download the MyChart app! Go to the app store, search "MyChart", open the app, select [facility], and log in with your MyChart username and password.  Due to Covid, a mask is required upon entering the hospital/clinic. If you do not have a mask, one will be given to you upon arrival. For doctor visits, patients may have 1 support person aged 50 or older with them. For treatment visits, patients cannot have anyone with them due to current Covid guidelines and our immunocompromised population.   Pembrolizumab injection What is this medicine? PEMBROLIZUMAB (pem broe liz ue mab) is a monoclonal antibody. It is used to treat certain types of cancer. This medicine may be used for other purposes; ask your health care provider  or pharmacist if you have questions. COMMON BRAND NAME(S): Keytruda What should I tell my health care provider before I take this medicine? They need to know if you have any of these conditions:  autoimmune diseases like Crohn's disease, ulcerative colitis, or lupus  have had or planning to have an allogeneic stem cell transplant (uses  someone else's stem cells)  history of organ transplant  history of chest radiation  nervous system problems like myasthenia gravis or Guillain-Barre syndrome  an unusual or allergic reaction to pembrolizumab, other medicines, foods, dyes, or preservatives  pregnant or trying to get pregnant  breast-feeding How should I use this medicine? This medicine is for infusion into a vein. It is given by a health care professional in a hospital or clinic setting. A special MedGuide will be given to you before each treatment. Be sure to read this information carefully each time. Talk to your pediatrician regarding the use of this medicine in children. While this drug may be prescribed for children as young as 6 months for selected conditions, precautions do apply. Overdosage: If you think you have taken too much of this medicine contact a poison control center or emergency room at once. NOTE: This medicine is only for you. Do not share this medicine with others. What if I miss a dose? It is important not to miss your dose. Call your doctor or health care professional if you are unable to keep an appointment. What may interact with this medicine? Interactions have not been studied. This list may not describe all possible interactions. Give your health care provider a list of all the medicines, herbs, non-prescription drugs, or dietary supplements you use. Also tell them if you smoke, drink alcohol, or use illegal drugs. Some items may interact with your medicine. What should I watch for while using this medicine? Your condition will be monitored carefully while you are receiving this medicine. You may need blood work done while you are taking this medicine. Do not become pregnant while taking this medicine or for 4 months after stopping it. Women should inform their doctor if they wish to become pregnant or think they might be pregnant. There is a potential for serious side effects to an unborn  child. Talk to your health care professional or pharmacist for more information. Do not breast-feed an infant while taking this medicine or for 4 months after the last dose. What side effects may I notice from receiving this medicine? Side effects that you should report to your doctor or health care professional as soon as possible:  allergic reactions like skin rash, itching or hives, swelling of the face, lips, or tongue  bloody or black, tarry  breathing problems  changes in vision  chest pain  chills  confusion  constipation  cough  diarrhea  dizziness or feeling faint or lightheaded  fast or irregular heartbeat  fever  flushing  joint pain  low blood counts - this medicine may decrease the number of white blood cells, red blood cells and platelets. You may be at increased risk for infections and bleeding.  muscle pain  muscle weakness  pain, tingling, numbness in the hands or feet  persistent headache  redness, blistering, peeling or loosening of the skin, including inside the mouth  signs and symptoms of high blood sugar such as dizziness; dry mouth; dry skin; fruity breath; nausea; stomach pain; increased hunger or thirst; increased urination  signs and symptoms of kidney injury like trouble passing urine or change in  the amount of urine  signs and symptoms of liver injury like dark urine, light-colored stools, loss of appetite, nausea, right upper belly pain, yellowing of the eyes or skin  sweating  swollen lymph nodes  weight loss Side effects that usually do not require medical attention (report to your doctor or health care professional if they continue or are bothersome):  decreased appetite  hair loss  tiredness This list may not describe all possible side effects. Call your doctor for medical advice about side effects. You may report side effects to FDA at 1-800-FDA-1088. Where should I keep my medicine? This drug is given in a hospital  or clinic and will not be stored at home. NOTE: This sheet is a summary. It may not cover all possible information. If you have questions about this medicine, talk to your doctor, pharmacist, or health care provider.  2021 Elsevier/Gold Standard (2019-03-26 21:44:53)  Pemetrexed injection What is this medicine? PEMETREXED (PEM e TREX ed) is a chemotherapy drug used to treat lung cancers like non-small cell lung cancer and mesothelioma. It may also be used to treat other cancers. This medicine may be used for other purposes; ask your health care provider or pharmacist if you have questions. COMMON BRAND NAME(S): Alimta What should I tell my health care provider before I take this medicine? They need to know if you have any of these conditions:  infection (especially a virus infection such as chickenpox, cold sores, or herpes)  kidney disease  low blood counts, like low white cell, platelet, or red cell counts  lung or breathing disease, like asthma  radiation therapy  an unusual or allergic reaction to pemetrexed, other medicines, foods, dyes, or preservative  pregnant or trying to get pregnant  breast-feeding How should I use this medicine? This drug is given as an infusion into a vein. It is administered in a hospital or clinic by a specially trained health care professional. Talk to your pediatrician regarding the use of this medicine in children. Special care may be needed. Overdosage: If you think you have taken too much of this medicine contact a poison control center or emergency room at once. NOTE: This medicine is only for you. Do not share this medicine with others. What if I miss a dose? It is important not to miss your dose. Call your doctor or health care professional if you are unable to keep an appointment. What may interact with this medicine? This medicine may interact with the following medications:  Ibuprofen This list may not describe all possible  interactions. Give your health care provider a list of all the medicines, herbs, non-prescription drugs, or dietary supplements you use. Also tell them if you smoke, drink alcohol, or use illegal drugs. Some items may interact with your medicine. What should I watch for while using this medicine? Visit your doctor for checks on your progress. This drug may make you feel generally unwell. This is not uncommon, as chemotherapy can affect healthy cells as well as cancer cells. Report any side effects. Continue your course of treatment even though you feel ill unless your doctor tells you to stop. In some cases, you may be given additional medicines to help with side effects. Follow all directions for their use. Call your doctor or health care professional for advice if you get a fever, chills or sore throat, or other symptoms of a cold or flu. Do not treat yourself. This drug decreases your body's ability to fight infections. Try to avoid  being around people who are sick. This medicine may increase your risk to bruise or bleed. Call your doctor or health care professional if you notice any unusual bleeding. Be careful brushing and flossing your teeth or using a toothpick because you may get an infection or bleed more easily. If you have any dental work done, tell your dentist you are receiving this medicine. Avoid taking products that contain aspirin, acetaminophen, ibuprofen, naproxen, or ketoprofen unless instructed by your doctor. These medicines may hide a fever. Call your doctor or health care professional if you get diarrhea or mouth sores. Do not treat yourself. To protect your kidneys, drink water or other fluids as directed while you are taking this medicine. Do not become pregnant while taking this medicine or for 6 months after stopping it. Women should inform their doctor if they wish to become pregnant or think they might be pregnant. Men should not father a child while taking this medicine and  for 3 months after stopping it. This may interfere with the ability to father a child. You should talk to your doctor or health care professional if you are concerned about your fertility. There is a potential for serious side effects to an unborn child. Talk to your health care professional or pharmacist for more information. Do not breast-feed an infant while taking this medicine or for 1 week after stopping it. What side effects may I notice from receiving this medicine? Side effects that you should report to your doctor or health care professional as soon as possible:  allergic reactions like skin rash, itching or hives, swelling of the face, lips, or tongue  breathing problems  redness, blistering, peeling or loosening of the skin, including inside the mouth  signs and symptoms of bleeding such as bloody or black, tarry stools; red or dark-brown urine; spitting up blood or brown material that looks like coffee grounds; red spots on the skin; unusual bruising or bleeding from the eye, gums, or nose  signs and symptoms of infection like fever or chills; cough; sore throat; pain or trouble passing urine  signs and symptoms of kidney injury like trouble passing urine or change in the amount of urine  signs and symptoms of liver injury like dark yellow or brown urine; general ill feeling or flu-like symptoms; light-colored stools; loss of appetite; nausea; right upper belly pain; unusually weak or tired; yellowing of the eyes or skin Side effects that usually do not require medical attention (report to your doctor or health care professional if they continue or are bothersome):  constipation  mouth sores  nausea, vomiting  unusually weak or tired This list may not describe all possible side effects. Call your doctor for medical advice about side effects. You may report side effects to FDA at 1-800-FDA-1088. Where should I keep my medicine? This drug is given in a hospital or clinic and  will not be stored at home. NOTE: This sheet is a summary. It may not cover all possible information. If you have questions about this medicine, talk to your doctor, pharmacist, or health care provider.  2021 Elsevier/Gold Standard (2017-06-13 16:11:33)

## 2020-09-26 ENCOUNTER — Other Ambulatory Visit: Payer: Self-pay | Admitting: Oncology

## 2020-09-27 ENCOUNTER — Inpatient Hospital Stay: Payer: BC Managed Care – PPO

## 2020-09-27 ENCOUNTER — Inpatient Hospital Stay (HOSPITAL_BASED_OUTPATIENT_CLINIC_OR_DEPARTMENT_OTHER): Payer: BC Managed Care – PPO | Admitting: Oncology

## 2020-09-27 ENCOUNTER — Other Ambulatory Visit: Payer: Self-pay

## 2020-09-27 VITALS — BP 122/77 | HR 92 | Temp 97.8°F | Resp 18 | Ht 65.0 in | Wt 161.2 lb

## 2020-09-27 DIAGNOSIS — C3412 Malignant neoplasm of upper lobe, left bronchus or lung: Secondary | ICD-10-CM | POA: Diagnosis not present

## 2020-09-27 DIAGNOSIS — Z923 Personal history of irradiation: Secondary | ICD-10-CM

## 2020-09-27 DIAGNOSIS — Z79899 Other long term (current) drug therapy: Secondary | ICD-10-CM | POA: Diagnosis not present

## 2020-09-27 DIAGNOSIS — D6481 Anemia due to antineoplastic chemotherapy: Secondary | ICD-10-CM

## 2020-09-27 DIAGNOSIS — Z853 Personal history of malignant neoplasm of breast: Secondary | ICD-10-CM

## 2020-09-27 DIAGNOSIS — C3431 Malignant neoplasm of lower lobe, right bronchus or lung: Secondary | ICD-10-CM | POA: Diagnosis not present

## 2020-09-27 DIAGNOSIS — Z5111 Encounter for antineoplastic chemotherapy: Secondary | ICD-10-CM | POA: Diagnosis not present

## 2020-09-27 DIAGNOSIS — Z5112 Encounter for antineoplastic immunotherapy: Secondary | ICD-10-CM | POA: Diagnosis not present

## 2020-09-27 LAB — CBC WITH DIFFERENTIAL (CANCER CENTER ONLY)
Abs Immature Granulocytes: 0.02 10*3/uL (ref 0.00–0.07)
Basophils Absolute: 0.1 10*3/uL (ref 0.0–0.1)
Basophils Relative: 1 %
Eosinophils Absolute: 0.1 10*3/uL (ref 0.0–0.5)
Eosinophils Relative: 2 %
HCT: 43.9 % (ref 36.0–46.0)
Hemoglobin: 14.7 g/dL (ref 12.0–15.0)
Immature Granulocytes: 0 %
Lymphocytes Relative: 14 %
Lymphs Abs: 0.8 10*3/uL (ref 0.7–4.0)
MCH: 38.3 pg — ABNORMAL HIGH (ref 26.0–34.0)
MCHC: 33.5 g/dL (ref 30.0–36.0)
MCV: 114.3 fL — ABNORMAL HIGH (ref 80.0–100.0)
Monocytes Absolute: 0.7 10*3/uL (ref 0.1–1.0)
Monocytes Relative: 13 %
Neutro Abs: 3.9 10*3/uL (ref 1.7–7.7)
Neutrophils Relative %: 70 %
Platelet Count: 304 10*3/uL (ref 150–400)
RBC: 3.84 MIL/uL — ABNORMAL LOW (ref 3.87–5.11)
RDW: 15 % (ref 11.5–15.5)
WBC Count: 5.7 10*3/uL (ref 4.0–10.5)
nRBC: 0 % (ref 0.0–0.2)

## 2020-09-27 LAB — CMP (CANCER CENTER ONLY)
ALT: 9 U/L (ref 0–44)
AST: 17 U/L (ref 15–41)
Albumin: 4.1 g/dL (ref 3.5–5.0)
Alkaline Phosphatase: 55 U/L (ref 38–126)
Anion gap: 8 (ref 5–15)
BUN: 17 mg/dL (ref 6–20)
CO2: 34 mmol/L — ABNORMAL HIGH (ref 22–32)
Calcium: 9.7 mg/dL (ref 8.9–10.3)
Chloride: 95 mmol/L — ABNORMAL LOW (ref 98–111)
Creatinine: 1.06 mg/dL — ABNORMAL HIGH (ref 0.44–1.00)
GFR, Estimated: >60 mL/min (ref >60)
Glucose, Bld: 98 mg/dL (ref 70–99)
Potassium: 4.5 mmol/L (ref 3.5–5.1)
Sodium: 137 mmol/L (ref 135–145)
Total Bilirubin: 0.5 mg/dL (ref 0.3–1.2)
Total Protein: 7.7 g/dL (ref 6.5–8.1)

## 2020-09-27 MED ORDER — PROCHLORPERAZINE MALEATE 10 MG PO TABS
10.0000 mg | ORAL_TABLET | Freq: Once | ORAL | Status: AC
Start: 2020-09-27 — End: 2020-09-27
  Administered 2020-09-27: 10 mg via ORAL
  Filled 2020-09-27: qty 1

## 2020-09-27 MED ORDER — SODIUM CHLORIDE 0.9 % IV SOLN
Freq: Once | INTRAVENOUS | Status: AC
  Filled 2020-09-27: qty 250

## 2020-09-27 MED ORDER — SODIUM CHLORIDE 0.9 % IV SOLN
400.0000 mg/m2 | Freq: Once | INTRAVENOUS | Status: AC
  Administered 2020-09-27: 700 mg via INTRAVENOUS
  Filled 2020-09-27: qty 20

## 2020-09-27 MED ORDER — SODIUM CHLORIDE 0.9 % IV SOLN
200.0000 mg | Freq: Once | INTRAVENOUS | Status: AC
  Administered 2020-09-27: 200 mg via INTRAVENOUS
  Filled 2020-09-27: qty 8

## 2020-09-27 NOTE — Patient Instructions (Signed)
Gina Cervantes    Discharge Instructions:  Thank you for choosing [facility] to provide your oncology and hematology care.   If you have a lab appointment with the [facility], please go directly to the [facility] and check in at the registration area.   Wear comfortable clothing and clothing appropriate for easy access to any Portacath or PICC line.   We strive to give you quality time with your provider. You may need to reschedule your appointment if you arrive late (15 or more minutes).  Arriving late affects you and other patients whose appointments are after yours.  Also, if you miss three or more appointments without notifying the office, you may be dismissed from the clinic at the provider's discretion.      For prescription refill requests, have your pharmacy contact our office and allow 72 hours for refills to be completed.    Today you received the following chemotherapy and/or immunotherapy agents Pembrolizumab (KEYTRUDA) & Pemetrexed (ALIMTA).   To help prevent nausea and vomiting after your treatment, we encourage you to take your nausea medication as directed.  BELOW ARE SYMPTOMS THAT SHOULD BE REPORTED IMMEDIATELY: . *FEVER GREATER THAN 100.4 F (38 C) OR HIGHER . *CHILLS OR SWEATING . *NAUSEA AND VOMITING THAT IS NOT CONTROLLED WITH YOUR NAUSEA MEDICATION . *UNUSUAL SHORTNESS OF BREATH . *UNUSUAL BRUISING OR BLEEDING . *URINARY PROBLEMS (pain or burning when urinating, or frequent urination) . *BOWEL PROBLEMS (unusual diarrhea, constipation, pain near the anus) . TENDERNESS IN MOUTH AND THROAT WITH OR WITHOUT PRESENCE OF ULCERS (sore throat, sores in mouth, or a toothache) . UNUSUAL RASH, SWELLING OR PAIN  . UNUSUAL VAGINAL DISCHARGE OR ITCHING   Items with * indicate a potential emergency and should be followed up as soon as possible or go to the Emergency Department if any problems should occur.  Please show the  CHEMOTHERAPY ALERT CARD or IMMUNOTHERAPY ALERT CARD at check-in to the Emergency Department and triage nurse.  Should you have questions after your visit or need to cancel or reschedule your appointment, please contact [facility]  Dept: [phone]  and follow the prompts.  Office hours are 8:00 a.m. to 4:30 p.m. Monday - Friday. Please note that voicemails left after 4:00 p.m. may not be returned until the following business day.  We are closed weekends and major holidays. You have access to a nurse at all times for urgent questions. Please call the main number to the clinic Dept: [phone] and follow the prompts.   For any non-urgent questions, you may also contact your provider using MyChart. We now offer e-Visits for anyone 59 and older to request care online for non-urgent symptoms. For details visit [email or web address].   Also download the MyChart app! Go to the app store, search "MyChart", open the app, select [facility], and log in with your MyChart username and password.  Due to Covid, a mask is required upon entering the hospital/clinic. If you do not have a mask, one will be given to you upon arrival. For doctor visits, patients may have 1 support person aged 62 or older with them. For treatment visits, patients cannot have anyone with them due to current Covid guidelines and our immunocompromised population.   Pembrolizumab injection What is this medicine? PEMBROLIZUMAB (pem broe liz ue mab) is a monoclonal antibody. It is used to treat certain types of cancer. This medicine may be used for other purposes; ask your  health care provider or pharmacist if you have questions. COMMON BRAND NAME(S): Keytruda What should I tell my health care provider before I take this medicine? They need to know if you have any of these conditions:  autoimmune diseases like Crohn's disease, ulcerative colitis, or lupus  have had or planning to have an allogeneic stem  cell transplant (uses someone else's stem cells)  history of organ transplant  history of chest radiation  nervous system problems like myasthenia gravis or Guillain-Barre syndrome  an unusual or allergic reaction to pembrolizumab, other medicines, foods, dyes, or preservatives  pregnant or trying to get pregnant  breast-feeding How should I use this medicine? This medicine is for infusion into a vein. It is given by a health care professional in a hospital or clinic setting. A special MedGuide will be given to you before each treatment. Be sure to read this information carefully each time. Talk to your pediatrician regarding the use of this medicine in children. While this drug may be prescribed for children as young as 6 months for selected conditions, precautions do apply. Overdosage: If you think you have taken too much of this medicine contact a poison control center or emergency room at once. NOTE: This medicine is only for you. Do not share this medicine with others. What if I miss a dose? It is important not to miss your dose. Call your doctor or health care professional if you are unable to keep an appointment. What may interact with this medicine? Interactions have not been studied. This list may not describe all possible interactions. Give your health care provider a list of all the medicines, herbs, non-prescription drugs, or dietary supplements you use. Also tell them if you smoke, drink alcohol, or use illegal drugs. Some items may interact with your medicine. What should I watch for while using this medicine? Your condition will be monitored carefully while you are receiving this medicine. You may need blood work done while you are taking this medicine. Do not become pregnant while taking this medicine or for 4 months after stopping it. Women should inform their doctor if they wish to become pregnant or think they might be pregnant. There is a potential for serious side  effects to an unborn child. Talk to your health care professional or pharmacist for more information. Do not breast-feed an infant while taking this medicine or for 4 months after the last dose. What side effects may I notice from receiving this medicine? Side effects that you should report to your doctor or health care professional as soon as possible:  allergic reactions like skin rash, itching or hives, swelling of the face, lips, or tongue  bloody or black, tarry  breathing problems  changes in vision  chest pain  chills  confusion  constipation  cough  diarrhea  dizziness or feeling faint or lightheaded  fast or irregular heartbeat  fever  flushing  joint pain  low blood counts - this medicine may decrease the number of white blood cells, red blood cells and platelets. You may be at increased risk for infections and bleeding.  muscle pain  muscle weakness  pain, tingling, numbness in the hands or feet  persistent headache  redness, blistering, peeling or loosening of the skin, including inside the mouth  signs and symptoms of high blood sugar such as dizziness; dry mouth; dry skin; fruity breath; nausea; stomach pain; increased hunger or thirst; increased urination  signs and symptoms of kidney injury like trouble passing urine  or change in the amount of urine  signs and symptoms of liver injury like dark urine, light-colored stools, loss of appetite, nausea, right upper belly pain, yellowing of the eyes or skin  sweating  swollen lymph nodes  weight loss Side effects that usually do not require medical attention (report to your doctor or health care professional if they continue or are bothersome):  decreased appetite  hair loss  tiredness This list may not describe all possible side effects. Call your doctor for medical advice about side effects. You may report side effects to FDA at 1-800-FDA-1088. Where should I keep my medicine? This drug is  given in a hospital or clinic and will not be stored at home. NOTE: This sheet is a summary. It may not cover all possible information. If you have questions about this medicine, talk to your doctor, pharmacist, or health care provider.  2021 Elsevier/Gold Standard (2019-03-26 21:44:53)  Pemetrexed injection What is this medicine? PEMETREXED (PEM e TREX ed) is a chemotherapy drug used to treat lung cancers like non-small cell lung cancer and mesothelioma. It may also be used to treat other cancers. This medicine may be used for other purposes; ask your health care provider or pharmacist if you have questions. COMMON BRAND NAME(S): Alimta What should I tell my health care provider before I take this medicine? They need to know if you have any of these conditions:  infection (especially a virus infection such as chickenpox, cold sores, or herpes)  kidney disease  low blood counts, like low white cell, platelet, or red cell counts  lung or breathing disease, like asthma  radiation therapy  an unusual or allergic reaction to pemetrexed, other medicines, foods, dyes, or preservative  pregnant or trying to get pregnant  breast-feeding How should I use this medicine? This drug is given as an infusion into a vein. It is administered in a hospital or clinic by a specially trained health care professional. Talk to your pediatrician regarding the use of this medicine in children. Special care may be needed. Overdosage: If you think you have taken too much of this medicine contact a poison control center or emergency room at once. NOTE: This medicine is only for you. Do not share this medicine with others. What if I miss a dose? It is important not to miss your dose. Call your doctor or health care professional if you are unable to keep an appointment. What may interact with this medicine? This medicine may interact with the following medications:  Ibuprofen This list may not describe all  possible interactions. Give your health care provider a list of all the medicines, herbs, non-prescription drugs, or dietary supplements you use. Also tell them if you smoke, drink alcohol, or use illegal drugs. Some items may interact with your medicine. What should I watch for while using this medicine? Visit your doctor for checks on your progress. This drug may make you feel generally unwell. This is not uncommon, as chemotherapy can affect healthy cells as well as cancer cells. Report any side effects. Continue your course of treatment even though you feel ill unless your doctor tells you to stop. In some cases, you may be given additional medicines to help with side effects. Follow all directions for their use. Call your doctor or health care professional for advice if you get a fever, chills or sore throat, or other symptoms of a cold or flu. Do not treat yourself. This drug decreases your body's ability to fight infections.  Try to avoid being around people who are sick. This medicine may increase your risk to bruise or bleed. Call your doctor or health care professional if you notice any unusual bleeding. Be careful brushing and flossing your teeth or using a toothpick because you may get an infection or bleed more easily. If you have any dental work done, tell your dentist you are receiving this medicine. Avoid taking products that contain aspirin, acetaminophen, ibuprofen, naproxen, or ketoprofen unless instructed by your doctor. These medicines may hide a fever. Call your doctor or health care professional if you get diarrhea or mouth sores. Do not treat yourself. To protect your kidneys, drink water or other fluids as directed while you are taking this medicine. Do not become pregnant while taking this medicine or for 6 months after stopping it. Women should inform their doctor if they wish to become pregnant or think they might be pregnant. Men should not father a child while taking this  medicine and for 3 months after stopping it. This may interfere with the ability to father a child. You should talk to your doctor or health care professional if you are concerned about your fertility. There is a potential for serious side effects to an unborn child. Talk to your health care professional or pharmacist for more information. Do not breast-feed an infant while taking this medicine or for 1 week after stopping it. What side effects may I notice from receiving this medicine? Side effects that you should report to your doctor or health care professional as soon as possible:  allergic reactions like skin rash, itching or hives, swelling of the face, lips, or tongue  breathing problems  redness, blistering, peeling or loosening of the skin, including inside the mouth  signs and symptoms of bleeding such as bloody or black, tarry stools; red or dark-brown urine; spitting up blood or brown material that looks like coffee grounds; red spots on the skin; unusual bruising or bleeding from the eye, gums, or nose  signs and symptoms of infection like fever or chills; cough; sore throat; pain or trouble passing urine  signs and symptoms of kidney injury like trouble passing urine or change in the amount of urine  signs and symptoms of liver injury like dark yellow or brown urine; general ill feeling or flu-like symptoms; light-colored stools; loss of appetite; nausea; right upper belly pain; unusually weak or tired; yellowing of the eyes or skin Side effects that usually do not require medical attention (report to your doctor or health care professional if they continue or are bothersome):  constipation  mouth sores  nausea, vomiting  unusually weak or tired This list may not describe all possible side effects. Call your doctor for medical advice about side effects. You may report side effects to FDA at 1-800-FDA-1088. Where should I keep my medicine? This drug is given in a hospital or  clinic and will not be stored at home. NOTE: This sheet is a summary. It may not cover all possible information. If you have questions about this medicine, talk to your doctor, pharmacist, or health care provider.  2021 Elsevier/Gold Standard (2017-06-13 16:11:33)

## 2020-09-27 NOTE — Progress Notes (Signed)
[facility] OFFICE PROGRESS NOTE   Diagnosis: Non-small cell lung cancer  INTERVAL HISTORY:   Ms. Kotz complete another cycle of Alimta and pembrolizumab on 09/06/2020.  No nausea or rash.  No new complaint.  She had transient swelling of the left lower leg recently that has resolved.  Objective:  Vital signs in last 24 hours:  Blood pressure 122/77, pulse 92, temperature 97.8 F (36.6 C), temperature source Oral, resp. rate 18, height '5\' 5"'  (1.651 m), weight 161 lb 3.2 oz (73.1 kg), last menstrual period 04/07/2008, SpO2 91 %.    HEENT: No thrush or ulcers Resp: End inspiratory rhonchi at the right upper posterior chest, no respiratory distress Cardio: Regular rate and rhythm GI: No hepatosplenomegaly Vascular: The left lower leg is slightly larger than the right side, no erythema or edema   Lab Results:  Lab Results  Component Value Date   WBC 5.7 09/27/2020   HGB 14.7 09/27/2020   HCT 43.9 09/27/2020   MCV 114.3 (H) 09/27/2020   PLT 304 09/27/2020   NEUTROABS 3.9 09/27/2020    CMP  Lab Results  Component Value Date   NA 134 (L) 09/06/2020   K 4.2 09/06/2020   CL 93 (L) 09/06/2020   CO2 34 (H) 09/06/2020   GLUCOSE 99 09/06/2020   BUN 17 09/06/2020   CREATININE 0.82 09/06/2020   CALCIUM 9.6 09/06/2020   PROT 7.5 09/06/2020   ALBUMIN 4.2 09/06/2020   AST 20 09/06/2020   ALT 13 09/06/2020   ALKPHOS 59 09/06/2020   BILITOT 0.5 09/06/2020   GFRNONAA >60 09/06/2020   GFRAA >60 01/29/2020     Medications: I have reviewed the patient's current medications.   Assessment/Plan: Anal cancer-left anal margin/anal canal mass, status post a biopsy on 08/09/2012 confirming invasive moderately differentiated squamous cell carcinoma,p16 positive.  -Staging PET scan 08/20/2012-hypermetabolic anal mass, no hypermetabolic metastases  -Initiation of concurrent radiation with cycle 1 of mitomycin C./5-fluorouracil 09/02/2012.  -She completed cycle 2  5-fluorouracil/mitomycin C. beginning 10/01/2012.  -She completed radiation 10/10/2012.  2. Right breast cancer 2009, ER positive, PR positive, HER-2 positive. She is status post neoadjuvant AC x4 cycles followed by Taxol/Herceptin. She underwent a right lumpectomy and sentinel lymph node biopsy 09/22/2008 with no evidence of residual invasive carcinoma and 5 negative sentinel lymph nodes. She then completed right breast radiation. She began tamoxifen 12/15/2008 and completed adjuvant Herceptin 05/25/2009. The tamoxifen was discontinued and she was switched to Arimidex in July 2013. Arimidex discontinuedin mid July 2019. 3. History of enlargement of the right tonsil.  4. Nonspecific pulmonary nodules without hypermetabolic activity on the PET scan 08/20/2012. Chest CT 10/21/2012 with scattered pulmonary nodules including nodules that were not present in 2010. Annual surveillance was recommended. Follow-up chest CT 10/24/2013 with scattered groundglass densities again noted in both lungs mostly unchanged when compared to the previous study. A sub-solid right lower lobe nodule had increased central density. The size was unchanged.The nodule has been present since 2009.  CT chest 06/13/2016-a sub-solid 9 mm right upper lobe nodule has enlarged compared to 2015, a right lower lobe irregular nodular density appears more well defined, new vague 5 mm groundglass left upper lobe nodule, left lower lobe 10 mm groundglass nodule is more apparent, posterior left upper lobe circumscribed solid nodule measures 11 x 10 mm and is new  PET scan 06/22/2016-malignant range activity involving the 11 mm left upper lobe nodule, no other hypermetabolic nodules, suspicious sub-solid right lower lobe nodule  Wedge resection  of a hypermetabolic left upper lobe nodule on 07/05/2016-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 No EGFR, ALK, BRAF, RET, ERBB2, or ROS1 alteration  CT chest 11/14/2016-status post left upper lobe wedge resection, stable right lung nodules  Chest CT 05/02/2017-stable bilateral solid and groundglass nodules, stable mild mediastinal lymphadenopathy  CT chest 12/13/2017-enlargement of right upper lobe nodule, other lung nodules and chest lymph node stable  PET 01/09/2018-right upper lobe nodule and right lower lobe nodule have enlarged since 2018 and have low level metabolic activity increased size of an 8 mm right upper lobe nodule without increased metabolic activity, stable activity and a mildly enlarged lower paratracheal node, no evidence of metastatic disease in the abdomen or pelvis  CT chest 04/22/2018-no new lung nodules, dominant right lung nodules slightly increased in size  Bronchoscopy/EBUS 05/04/2019 with biopsy of level 7 node, biopsy of right lower lobe nodule, FNA of right lower lobe nodule-negative  CT biopsy of right upper lobe nodule 06/07/2018-adenocarcinoma with lepidic and acinar patterns  SBRT to the dominant right upper lobe nodule 07/23/2018-07/30/2018  CT 09/16/2018-slight increase in size of medial right upper lobe nodule-possibly secondary to interval radiation, other nodules are unchanged  CT 12/25/2018-medial right upper lobe nodule has decreased in size slightly, multiple additional solid and groundglass nodules are stable  CT 06/09/2019-enlarging right lower lobe nodule, enlarging subsolid nodule, new right upper lobe nodule  SBRT to right lower lobe lung nodule, 3 fractions 07/08/2019-07/14/2019  CT 12/05/2019-dominant right lower lobe lesion stable to slightly decreased in size, progressive solid component associated with a dominant right upper lung nodule, mild progression of a left upper lobe nodule, new subsolid anterior medial left upper lobe nodule  Cycle 1 Alimta/carboplatin/pembrolizumab 02/04/2020  Cycle 2  Alimta/carboplatin/pembrolizumab 02/23/2020  Cycle 3 Alimta/carboplatin/pembrolizumab 03/15/2020  Cycle 4 Alimta/carboplatin/pembrolizumab 04/05/2020 (Alimta and carboplatin dose reduced secondary to anemia)  CT chest 04/12/2020-decreased size of solid right lower lobe and right upper lobe nodules, groundglass nodules in the left lung unchanged  Cycle 5 Alimta/pembrolizumab 05/03/2020  Cycle 6 Alimta/pembrolizumab 05/21/2020  Cycle 7 Alimta/pembrolizumab 06/14/2020  Cycle 8 Alimta/pembrolizumab 07/05/2020  CT chest 07/22/2020-stable nodules, no evidence of disease progression, unchanged multifocal groundglass nodules suspicious for multifocal adenocarcinoma.  Cycle 9 Alimta/Pembrolizumab 07/26/2020  Cycle 10 Alimta/Pembrolizumab 08/16/2020  Cycle 11 Alimta/Pembrolizumab 09/06/2020  Cycle 12 Alimta/pembrolizumab 09/27/2020 5. Baker's cyst left leg, pain and swelling in the left calf, negative Doppler 01/24/2020 and 01/29/2020-evaluated by orthopedics and underwent aspiration of the cyst 01/29/2000, completed a course of Keflex 6. Anemia secondary to chemotherapy, status post 2 units of packed red blood cells on 04/10/2020-improved      Disposition: Gina Cervantes appears stable.  She will complete another treatment with Alimta/pembrolizumab today.  She will return for an office visit and chemotherapy in 3 weeks.  She will be scheduled for a restaging CT in early July.  She continues folic acid.  She will receive vitamin B12 today.  Betsy Coder, MD  09/27/2020  1:40 PM

## 2020-10-17 ENCOUNTER — Other Ambulatory Visit: Payer: Self-pay | Admitting: Oncology

## 2020-10-18 ENCOUNTER — Other Ambulatory Visit: Payer: Self-pay

## 2020-10-18 ENCOUNTER — Inpatient Hospital Stay: Payer: BC Managed Care – PPO | Attending: Oncology

## 2020-10-18 ENCOUNTER — Inpatient Hospital Stay (HOSPITAL_BASED_OUTPATIENT_CLINIC_OR_DEPARTMENT_OTHER): Payer: BC Managed Care – PPO | Admitting: Oncology

## 2020-10-18 ENCOUNTER — Inpatient Hospital Stay: Payer: BC Managed Care – PPO

## 2020-10-18 VITALS — BP 123/73 | HR 100 | Temp 98.2°F | Resp 20 | Ht 65.0 in | Wt 161.0 lb

## 2020-10-18 DIAGNOSIS — Z85048 Personal history of other malignant neoplasm of rectum, rectosigmoid junction, and anus: Secondary | ICD-10-CM | POA: Insufficient documentation

## 2020-10-18 DIAGNOSIS — C3412 Malignant neoplasm of upper lobe, left bronchus or lung: Secondary | ICD-10-CM

## 2020-10-18 DIAGNOSIS — Z853 Personal history of malignant neoplasm of breast: Secondary | ICD-10-CM | POA: Diagnosis not present

## 2020-10-18 DIAGNOSIS — Z5111 Encounter for antineoplastic chemotherapy: Secondary | ICD-10-CM | POA: Diagnosis not present

## 2020-10-18 DIAGNOSIS — C3431 Malignant neoplasm of lower lobe, right bronchus or lung: Secondary | ICD-10-CM | POA: Diagnosis not present

## 2020-10-18 DIAGNOSIS — Z170 Estrogen receptor positive status [ER+]: Secondary | ICD-10-CM | POA: Diagnosis not present

## 2020-10-18 DIAGNOSIS — Z923 Personal history of irradiation: Secondary | ICD-10-CM | POA: Insufficient documentation

## 2020-10-18 DIAGNOSIS — Z79899 Other long term (current) drug therapy: Secondary | ICD-10-CM | POA: Insufficient documentation

## 2020-10-18 LAB — CBC WITH DIFFERENTIAL (CANCER CENTER ONLY)
Abs Immature Granulocytes: 0.03 10*3/uL (ref 0.00–0.07)
Basophils Absolute: 0.1 10*3/uL (ref 0.0–0.1)
Basophils Relative: 1 %
Eosinophils Absolute: 0.1 10*3/uL (ref 0.0–0.5)
Eosinophils Relative: 3 %
HCT: 45.5 % (ref 36.0–46.0)
Hemoglobin: 14.9 g/dL (ref 12.0–15.0)
Immature Granulocytes: 1 %
Lymphocytes Relative: 14 %
Lymphs Abs: 0.7 10*3/uL (ref 0.7–4.0)
MCH: 38.8 pg — ABNORMAL HIGH (ref 26.0–34.0)
MCHC: 32.7 g/dL (ref 30.0–36.0)
MCV: 118.5 fL — ABNORMAL HIGH (ref 80.0–100.0)
Monocytes Absolute: 0.7 10*3/uL (ref 0.1–1.0)
Monocytes Relative: 15 %
Neutro Abs: 3.3 10*3/uL (ref 1.7–7.7)
Neutrophils Relative %: 66 %
Platelet Count: 264 10*3/uL (ref 150–400)
RBC: 3.84 MIL/uL — ABNORMAL LOW (ref 3.87–5.11)
RDW: 18 % — ABNORMAL HIGH (ref 11.5–15.5)
WBC Count: 5 10*3/uL (ref 4.0–10.5)
nRBC: 0.6 % — ABNORMAL HIGH (ref 0.0–0.2)

## 2020-10-18 LAB — CMP (CANCER CENTER ONLY)
ALT: 16 U/L (ref 0–44)
AST: 21 U/L (ref 15–41)
Albumin: 3.8 g/dL (ref 3.5–5.0)
Alkaline Phosphatase: 62 U/L (ref 38–126)
Anion gap: 9 (ref 5–15)
BUN: 14 mg/dL (ref 6–20)
CO2: 33 mmol/L — ABNORMAL HIGH (ref 22–32)
Calcium: 8.9 mg/dL (ref 8.9–10.3)
Chloride: 94 mmol/L — ABNORMAL LOW (ref 98–111)
Creatinine: 0.86 mg/dL (ref 0.44–1.00)
GFR, Estimated: >60 mL/min (ref >60)
Glucose, Bld: 106 mg/dL — ABNORMAL HIGH (ref 70–99)
Potassium: 4.4 mmol/L (ref 3.5–5.1)
Sodium: 136 mmol/L (ref 135–145)
Total Bilirubin: 0.6 mg/dL (ref 0.3–1.2)
Total Protein: 7.4 g/dL (ref 6.5–8.1)

## 2020-10-18 MED ORDER — SODIUM CHLORIDE 0.9 % IV SOLN
400.0000 mg/m2 | Freq: Once | INTRAVENOUS | Status: AC
  Administered 2020-10-18: 700 mg via INTRAVENOUS
  Filled 2020-10-18: qty 8

## 2020-10-18 MED ORDER — SODIUM CHLORIDE 0.9 % IV SOLN
Freq: Once | INTRAVENOUS | Status: AC
Start: 2020-10-18 — End: 2020-10-18
  Filled 2020-10-18: qty 250

## 2020-10-18 MED ORDER — PROCHLORPERAZINE MALEATE 10 MG PO TABS
10.0000 mg | ORAL_TABLET | Freq: Once | ORAL | Status: AC
Start: 2020-10-18 — End: 2020-10-18
  Administered 2020-10-18: 10 mg via ORAL
  Filled 2020-10-18: qty 1

## 2020-10-18 MED ORDER — SODIUM CHLORIDE 0.9 % IV SOLN
200.0000 mg | Freq: Once | INTRAVENOUS | Status: AC
  Administered 2020-10-18: 200 mg via INTRAVENOUS
  Filled 2020-10-18: qty 8

## 2020-10-18 NOTE — Patient Instructions (Signed)
Gina Cervantes    Discharge Instructions:  Thank you for choosing [facility] to provide your oncology and hematology care.   If you have a lab appointment with the [facility], please go directly to the [facility] and check in at the registration area.   Wear comfortable clothing and clothing appropriate for easy access to any Portacath or PICC line.   We strive to give you quality time with your provider. You may need to reschedule your appointment if you arrive late (15 or more minutes).  Arriving late affects you and other patients whose appointments are after yours.  Also, if you miss three or more appointments without notifying the office, you may be dismissed from the clinic at the provider's discretion.      For prescription refill requests, have your pharmacy contact our office and allow 72 hours for refills to be completed.    Today you received the following chemotherapy and/or immunotherapy agents Pembrolizumab (KEYTRUDA) & Pemetrexed (ALIMTA).   To help prevent nausea and vomiting after your treatment, we encourage you to take your nausea medication as directed.  BELOW ARE SYMPTOMS THAT SHOULD BE REPORTED IMMEDIATELY: *FEVER GREATER THAN 100.4 F (38 C) OR HIGHER *CHILLS OR SWEATING *NAUSEA AND VOMITING THAT IS NOT CONTROLLED WITH YOUR NAUSEA MEDICATION *UNUSUAL SHORTNESS OF BREATH *UNUSUAL BRUISING OR BLEEDING *URINARY PROBLEMS (pain or burning when urinating, or frequent urination) *BOWEL PROBLEMS (unusual diarrhea, constipation, pain near the anus) TENDERNESS IN MOUTH AND THROAT WITH OR WITHOUT PRESENCE OF ULCERS (sore throat, sores in mouth, or a toothache) UNUSUAL RASH, SWELLING OR PAIN  UNUSUAL VAGINAL DISCHARGE OR ITCHING   Items with * indicate a potential emergency and should be followed up as soon as possible or go to the Emergency Department if any problems should occur.  Please show the CHEMOTHERAPY ALERT CARD or  IMMUNOTHERAPY ALERT CARD at check-in to the Emergency Department and triage nurse.  Should you have questions after your visit or need to cancel or reschedule your appointment, please contact [facility]  Dept: [phone]  and follow the prompts.  Office hours are 8:00 a.m. to 4:30 p.m. Monday - Friday. Please note that voicemails left after 4:00 p.m. may not be returned until the following business day.  We are closed weekends and major holidays. You have access to a nurse at all times for urgent questions. Please call the main number to the clinic Dept: [phone] and follow the prompts.   For any non-urgent questions, you may also contact your provider using MyChart. We now offer e-Visits for anyone 32 and older to request care online for non-urgent symptoms. For details visit [email or web address].   Also download the MyChart app! Go to the app store, search "MyChart", open the app, select [facility], and log in with your MyChart username and password.  Due to Covid, a mask is required upon entering the hospital/clinic. If you do not have a mask, one will be given to you upon arrival. For doctor visits, patients may have 1 support person aged 87 or older with them. For treatment visits, patients cannot have anyone with them due to current Covid guidelines and our immunocompromised population.   Pembrolizumab injection What is this medicine? PEMBROLIZUMAB (pem broe liz ue mab) is a monoclonal antibody. It is used to treat certain types of cancer. This medicine may be used for other purposes; ask your health care provider or pharmacist if you have questions. COMMON  BRAND NAME(S): Keytruda What should I tell my health care provider before I take this medicine? They need to know if you have any of these conditions: autoimmune diseases like Crohn's disease, ulcerative colitis, or lupus have had or planning to have an allogeneic stem cell transplant (uses someone  else's stem cells) history of organ transplant history of chest radiation nervous system problems like myasthenia gravis or Guillain-Barre syndrome an unusual or allergic reaction to pembrolizumab, other medicines, foods, dyes, or preservatives pregnant or trying to get pregnant breast-feeding How should I use this medicine? This medicine is for infusion into a vein. It is given by a health care professional in a hospital or clinic setting. A special MedGuide will be given to you before each treatment. Be sure to read this information carefully each time. Talk to your pediatrician regarding the use of this medicine in children. While this drug may be prescribed for children as young as 6 months for selected conditions, precautions do apply. Overdosage: If you think you have taken too much of this medicine contact a poison control center or emergency room at once. NOTE: This medicine is only for you. Do not share this medicine with others. What if I miss a dose? It is important not to miss your dose. Call your doctor or health care professional if you are unable to keep an appointment. What may interact with this medicine? Interactions have not been studied. This list may not describe all possible interactions. Give your health care provider a list of all the medicines, herbs, non-prescription drugs, or dietary supplements you use. Also tell them if you smoke, drink alcohol, or use illegal drugs. Some items may interact with your medicine. What should I watch for while using this medicine? Your condition will be monitored carefully while you are receiving this medicine. You may need blood work done while you are taking this medicine. Do not become pregnant while taking this medicine or for 4 months after stopping it. Women should inform their doctor if they wish to become pregnant or think they might be pregnant. There is a potential for serious side effects to an unborn child. Talk to your  health care professional or pharmacist for more information. Do not breast-feed an infant while taking this medicine or for 4 months after the last dose. What side effects may I notice from receiving this medicine? Side effects that you should report to your doctor or health care professional as soon as possible: allergic reactions like skin rash, itching or hives, swelling of the face, lips, or tongue bloody or black, tarry breathing problems changes in vision chest pain chills confusion constipation cough diarrhea dizziness or feeling faint or lightheaded fast or irregular heartbeat fever flushing joint pain low blood counts - this medicine may decrease the number of white blood cells, red blood cells and platelets. You may be at increased risk for infections and bleeding. muscle pain muscle weakness pain, tingling, numbness in the hands or feet persistent headache redness, blistering, peeling or loosening of the skin, including inside the mouth signs and symptoms of high blood sugar such as dizziness; dry mouth; dry skin; fruity breath; nausea; stomach pain; increased hunger or thirst; increased urination signs and symptoms of kidney injury like trouble passing urine or change in the amount of urine signs and symptoms of liver injury like dark urine, light-colored stools, loss of appetite, nausea, right upper belly pain, yellowing of the eyes or skin sweating swollen lymph nodes weight loss Side effects that  usually do not require medical attention (report to your doctor or health care professional if they continue or are bothersome): decreased appetite hair loss tiredness This list may not describe all possible side effects. Call your doctor for medical advice about side effects. You may report side effects to FDA at 1-800-FDA-1088. Where should I keep my medicine? This drug is given in a hospital or clinic and will not be stored at home. NOTE: This sheet is a summary. It may  not cover all possible information. If you have questions about this medicine, talk to your doctor, pharmacist, or health care provider.  2021 Elsevier/Gold Standard (2019-03-26 21:44:53)  Pemetrexed injection What is this medicine? PEMETREXED (PEM e TREX ed) is a chemotherapy drug used to treat lung cancers like non-small cell lung cancer and mesothelioma. It may also be used to treat other cancers. This medicine may be used for other purposes; ask your health care provider or pharmacist if you have questions. COMMON BRAND NAME(S): Alimta What should I tell my health care provider before I take this medicine? They need to know if you have any of these conditions: infection (especially a virus infection such as chickenpox, cold sores, or herpes) kidney disease low blood counts, like low white cell, platelet, or red cell counts lung or breathing disease, like asthma radiation therapy an unusual or allergic reaction to pemetrexed, other medicines, foods, dyes, or preservative pregnant or trying to get pregnant breast-feeding How should I use this medicine? This drug is given as an infusion into a vein. It is administered in a hospital or clinic by a specially trained health care professional. Talk to your pediatrician regarding the use of this medicine in children. Special care may be needed. Overdosage: If you think you have taken too much of this medicine contact a poison control center or emergency room at once. NOTE: This medicine is only for you. Do not share this medicine with others. What if I miss a dose? It is important not to miss your dose. Call your doctor or health care professional if you are unable to keep an appointment. What may interact with this medicine? This medicine may interact with the following medications: Ibuprofen This list may not describe all possible interactions. Give your health care provider a list of all the medicines, herbs, non-prescription drugs, or  dietary supplements you use. Also tell them if you smoke, drink alcohol, or use illegal drugs. Some items may interact with your medicine. What should I watch for while using this medicine? Visit your doctor for checks on your progress. This drug may make you feel generally unwell. This is not uncommon, as chemotherapy can affect healthy cells as well as cancer cells. Report any side effects. Continue your course of treatment even though you feel ill unless your doctor tells you to stop. In some cases, you may be given additional medicines to help with side effects. Follow all directions for their use. Call your doctor or health care professional for advice if you get a fever, chills or sore throat, or other symptoms of a cold or flu. Do not treat yourself. This drug decreases your body's ability to fight infections. Try to avoid being around people who are sick. This medicine may increase your risk to bruise or bleed. Call your doctor or health care professional if you notice any unusual bleeding. Be careful brushing and flossing your teeth or using a toothpick because you may get an infection or bleed more easily. If you have any  dental work done, tell your dentist you are receiving this medicine. Avoid taking products that contain aspirin, acetaminophen, ibuprofen, naproxen, or ketoprofen unless instructed by your doctor. These medicines may hide a fever. Call your doctor or health care professional if you get diarrhea or mouth sores. Do not treat yourself. To protect your kidneys, drink water or other fluids as directed while you are taking this medicine. Do not become pregnant while taking this medicine or for 6 months after stopping it. Women should inform their doctor if they wish to become pregnant or think they might be pregnant. Men should not father a child while taking this medicine and for 3 months after stopping it. This may interfere with the ability to father a child. You should talk to  your doctor or health care professional if you are concerned about your fertility. There is a potential for serious side effects to an unborn child. Talk to your health care professional or pharmacist for more information. Do not breast-feed an infant while taking this medicine or for 1 week after stopping it. What side effects may I notice from receiving this medicine? Side effects that you should report to your doctor or health care professional as soon as possible: allergic reactions like skin rash, itching or hives, swelling of the face, lips, or tongue breathing problems redness, blistering, peeling or loosening of the skin, including inside the mouth signs and symptoms of bleeding such as bloody or black, tarry stools; red or dark-brown urine; spitting up blood or brown material that looks like coffee grounds; red spots on the skin; unusual bruising or bleeding from the eye, gums, or nose signs and symptoms of infection like fever or chills; cough; sore throat; pain or trouble passing urine signs and symptoms of kidney injury like trouble passing urine or change in the amount of urine signs and symptoms of liver injury like dark yellow or brown urine; general ill feeling or flu-like symptoms; light-colored stools; loss of appetite; nausea; right upper belly pain; unusually weak or tired; yellowing of the eyes or skin Side effects that usually do not require medical attention (report to your doctor or health care professional if they continue or are bothersome): constipation mouth sores nausea, vomiting unusually weak or tired This list may not describe all possible side effects. Call your doctor for medical advice about side effects. You may report side effects to FDA at 1-800-FDA-1088. Where should I keep my medicine? This drug is given in a hospital or clinic and will not be stored at home. NOTE: This sheet is a summary. It may not cover all possible information. If you have questions about  this medicine, talk to your doctor, pharmacist, or health care provider.  2021 Elsevier/Gold Standard (2017-06-13 16:11:33)

## 2020-10-18 NOTE — Progress Notes (Signed)
[facility] OFFICE VISIT PROGRESS NOTE  I connected with Marchelle Gearing on 10/18/20 at 10:40 AM EDT by video enabled telemedicine visit and verified that I am speaking with the correct person using two identifiers.   I discussed the limitations, risks, security and privacy concerns of performing an evaluation and management service by telemedicine and the availability of in-person appointments. I also discussed with the patient that there may be a patient responsible charge related to this service. The patient expressed understanding and agreed to proceed.   Patient's location: Office Provider's location: Home  Diagnosis: Non-small cell lung cancer  INTERVAL HISTORY:  Gina Cervantes completed another cycle of Alimta/pembrolizumab on 09/27/2020.  No rash, mouth sores, or diarrhea.  She has increased dyspnea in the hot weather.  No other complaint. Objective:  Vital signs in last 24 hours:  Blood pressure 123/73, pulse 100, temperature 98.2 F (36.8 C), temperature source Oral, resp. rate 20, height '5\' 5"'  (1.651 m), weight 161 lb (73 kg), last menstrual period 04/07/2008, SpO2 90 %.     Lab Results:  Lab Results  Component Value Date   WBC 5.0 10/18/2020   HGB 14.9 10/18/2020   HCT 45.5 10/18/2020   MCV 118.5 (H) 10/18/2020   PLT 264 10/18/2020   NEUTROABS 3.3 10/18/2020     Medications: I have reviewed the patient's current medications.  Assessment/Plan: Anal cancer-left anal margin/anal canal mass, status post a biopsy on 08/09/2012 confirming invasive moderately differentiated squamous cell carcinoma,p16 positive.   -Staging PET scan 08/20/2012-hypermetabolic anal mass, no hypermetabolic metastases   -Initiation of concurrent radiation with cycle 1 of mitomycin C./5-fluorouracil 09/02/2012.   -She completed cycle 2 5-fluorouracil/mitomycin C. beginning 10/01/2012.   -She completed radiation 10/10/2012.   2. Right breast  cancer 2009, ER positive, PR positive, HER-2 positive. She is status post neoadjuvant AC x4 cycles followed by Taxol/Herceptin. She underwent a right lumpectomy and sentinel lymph node biopsy 09/22/2008 with no evidence of residual invasive carcinoma and 5 negative sentinel lymph nodes. She then completed right breast radiation. She began tamoxifen 12/15/2008 and completed adjuvant Herceptin 05/25/2009. The tamoxifen was discontinued and she was switched to Arimidex in July 2013.  Arimidex discontinued in mid July 2019. 3. History of enlargement of the right tonsil.   4. Nonspecific pulmonary nodules without hypermetabolic activity on the PET scan 08/20/2012. Chest CT 10/21/2012 with scattered pulmonary nodules including nodules that were not present in 2010. Annual surveillance was recommended. Follow-up chest CT 10/24/2013 with scattered groundglass densities again noted in both lungs mostly unchanged when compared to the previous study. A sub-solid right lower lobe nodule had increased central density. The size was unchanged.The nodule has been present since 2009. 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 No EGFR, ALK, BRAF, RET, ERBB2, or ROS1 alteration CT chest 11/14/2016-status post left  upper  lobe wedge resection, stable right lung nodules Chest CT 05/02/2017-stable bilateral solid and groundglass nodules, stable mild mediastinal lymphadenopathy CT chest 12/13/2017- enlargement of right upper lobe nodule, other lung nodules and chest lymph node stable PET 01/09/2018- right upper lobe nodule and right lower lobe nodule have enlarged since 2018 and have low level metabolic activity increased size of an 8 mm right upper lobe nodule without increased metabolic activity, stable activity and a mildly enlarged lower paratracheal node, no evidence of metastatic disease in the abdomen or pelvis CT chest 04/22/2018- no new lung nodules, dominant right lung nodules slightly increased in size Bronchoscopy/EBUS 05/04/2019 with biopsy of level 7 node, biopsy of right lower lobe nodule, FNA of right lower lobe nodule-negative CT biopsy of right upper lobe nodule 06/07/2018- adenocarcinoma with lepidic and acinar patterns SBRT to the dominant right upper lobe nodule 07/23/2018-07/30/2018 CT 09/16/2018-slight increase in size of medial right upper lobe nodule-possibly secondary to interval radiation, other nodules are unchanged CT 12/25/2018- medial right upper lobe nodule has decreased in size slightly, multiple additional solid and groundglass nodules are stable CT 06/09/2019-enlarging right lower lobe nodule, enlarging subsolid nodule, new right upper lobe nodule SBRT to right lower lobe lung nodule, 3 fractions 07/08/2019-07/14/2019 CT 12/05/2019-dominant right lower lobe lesion stable to slightly decreased in size, progressive solid component associated with a dominant right upper lung nodule, mild progression of a left upper lobe nodule, new subsolid anterior medial left upper lobe nodule Cycle 1 Alimta/carboplatin/pembrolizumab 02/04/2020 Cycle 2 Alimta/carboplatin/pembrolizumab 02/23/2020 Cycle 3 Alimta/carboplatin/pembrolizumab 03/15/2020 Cycle 4 Alimta/carboplatin/pembrolizumab 04/05/2020 (Alimta and  carboplatin dose reduced secondary to anemia) CT chest 04/12/2020-decreased size of solid right lower lobe and right upper lobe nodules, groundglass nodules in the left lung unchanged Cycle 5 Alimta/pembrolizumab 05/03/2020 Cycle 6 Alimta/pembrolizumab 05/21/2020 Cycle 7 Alimta/pembrolizumab 06/14/2020 Cycle 8 Alimta/pembrolizumab 07/05/2020 CT chest 07/22/2020-stable nodules, no evidence of disease progression, unchanged multifocal groundglass nodules suspicious for multifocal adenocarcinoma. Cycle 9 Alimta/Pembrolizumab 07/26/2020 Cycle 10 Alimta/Pembrolizumab 08/16/2020 Cycle 11 Alimta/Pembrolizumab 09/06/2020 Cycle 12 Alimta/pembrolizumab 09/27/2020 Cycle 12 Alimta/pembrolizumab 10/18/2020 5.  Baker's cyst left leg, pain and swelling in the left calf, negative Doppler 01/24/2020 and 01/29/2020-evaluated by orthopedics and underwent aspiration of the cyst 01/29/2000, completed a course of Keflex 6.  Anemia secondary to chemotherapy, status post 2 units of packed red blood cells on 04/10/2020-improved       Disposition: Ms. Cow appears stable.  She will get another cycle of Alimta/pembrolizumab today.  She is scheduled for restaging chest CT on 11/09/2020 followed by an office visit on 11/11/2020.  We will decide on continuing the current treatment based on the restaging CT.  She will use her inhaler as needed for dyspnea exacerbated by the warm weather. Her CBC is reviewed and is adequate to proceed with chemotherapy today.  I discussed the assessment and treatment plan with the patient. The patient was provided an opportunity to ask questions and all were answered. The patient agreed with the plan and demonstrated an understanding of the instructions.   The patient was advised to call back or seek an in-person evaluation if the symptoms worsen or if the condition fails to improve as anticipated.   Betsy Coder ANP/GNP-BC   10/18/2020 10:31 AM

## 2020-11-07 ENCOUNTER — Other Ambulatory Visit: Payer: Self-pay | Admitting: Oncology

## 2020-11-09 ENCOUNTER — Other Ambulatory Visit: Payer: BC Managed Care – PPO

## 2020-11-11 ENCOUNTER — Ambulatory Visit: Payer: BC Managed Care – PPO

## 2020-11-11 ENCOUNTER — Other Ambulatory Visit: Payer: Self-pay | Admitting: Oncology

## 2020-11-11 ENCOUNTER — Ambulatory Visit: Payer: BC Managed Care – PPO | Admitting: Nurse Practitioner

## 2020-11-11 ENCOUNTER — Other Ambulatory Visit: Payer: BC Managed Care – PPO

## 2020-11-12 ENCOUNTER — Ambulatory Visit
Admission: RE | Admit: 2020-11-12 | Discharge: 2020-11-12 | Disposition: A | Discharge status: Home / Self Care | Payer: BC Managed Care – PPO | Source: Ambulatory Visit | Attending: Oncology | Admitting: Oncology

## 2020-11-12 ENCOUNTER — Other Ambulatory Visit: Payer: Self-pay

## 2020-11-12 DIAGNOSIS — J90 Pleural effusion, not elsewhere classified: Secondary | ICD-10-CM | POA: Diagnosis not present

## 2020-11-12 DIAGNOSIS — J439 Emphysema, unspecified: Secondary | ICD-10-CM | POA: Diagnosis not present

## 2020-11-12 DIAGNOSIS — I700 Atherosclerosis of aorta: Secondary | ICD-10-CM | POA: Diagnosis not present

## 2020-11-12 DIAGNOSIS — C3412 Malignant neoplasm of upper lobe, left bronchus or lung: Secondary | ICD-10-CM

## 2020-11-12 DIAGNOSIS — R911 Solitary pulmonary nodule: Secondary | ICD-10-CM | POA: Diagnosis not present

## 2020-11-16 ENCOUNTER — Inpatient Hospital Stay (HOSPITAL_BASED_OUTPATIENT_CLINIC_OR_DEPARTMENT_OTHER): Payer: BC Managed Care – PPO | Admitting: Nurse Practitioner

## 2020-11-16 ENCOUNTER — Inpatient Hospital Stay: Payer: BC Managed Care – PPO

## 2020-11-16 ENCOUNTER — Inpatient Hospital Stay: Payer: BC Managed Care – PPO | Attending: Oncology

## 2020-11-16 ENCOUNTER — Other Ambulatory Visit: Payer: Self-pay

## 2020-11-16 ENCOUNTER — Encounter: Payer: Self-pay | Admitting: Nurse Practitioner

## 2020-11-16 VITALS — BP 119/73 | HR 82 | Temp 97.8°F | Resp 20 | Ht 65.0 in | Wt 161.0 lb

## 2020-11-16 DIAGNOSIS — Z170 Estrogen receptor positive status [ER+]: Secondary | ICD-10-CM | POA: Diagnosis not present

## 2020-11-16 DIAGNOSIS — C3412 Malignant neoplasm of upper lobe, left bronchus or lung: Secondary | ICD-10-CM | POA: Diagnosis not present

## 2020-11-16 DIAGNOSIS — Z923 Personal history of irradiation: Secondary | ICD-10-CM | POA: Insufficient documentation

## 2020-11-16 DIAGNOSIS — C3431 Malignant neoplasm of lower lobe, right bronchus or lung: Secondary | ICD-10-CM | POA: Diagnosis not present

## 2020-11-16 DIAGNOSIS — Z9221 Personal history of antineoplastic chemotherapy: Secondary | ICD-10-CM | POA: Insufficient documentation

## 2020-11-16 DIAGNOSIS — Z853 Personal history of malignant neoplasm of breast: Secondary | ICD-10-CM | POA: Insufficient documentation

## 2020-11-16 DIAGNOSIS — Z85048 Personal history of other malignant neoplasm of rectum, rectosigmoid junction, and anus: Secondary | ICD-10-CM | POA: Insufficient documentation

## 2020-11-16 LAB — CBC WITH DIFFERENTIAL (CANCER CENTER ONLY)
Abs Immature Granulocytes: 0.03 10*3/uL (ref 0.00–0.07)
Basophils Absolute: 0.1 10*3/uL (ref 0.0–0.1)
Basophils Relative: 1 %
Eosinophils Absolute: 0.1 10*3/uL (ref 0.0–0.5)
Eosinophils Relative: 3 %
HCT: 47.1 % — ABNORMAL HIGH (ref 36.0–46.0)
Hemoglobin: 15.6 g/dL — ABNORMAL HIGH (ref 12.0–15.0)
Immature Granulocytes: 1 %
Lymphocytes Relative: 15 %
Lymphs Abs: 0.7 10*3/uL (ref 0.7–4.0)
MCH: 39.6 pg — ABNORMAL HIGH (ref 26.0–34.0)
MCHC: 33.1 g/dL (ref 30.0–36.0)
MCV: 119.5 fL — ABNORMAL HIGH (ref 80.0–100.0)
Monocytes Absolute: 0.7 10*3/uL (ref 0.1–1.0)
Monocytes Relative: 13 %
Neutro Abs: 3.4 10*3/uL (ref 1.7–7.7)
Neutrophils Relative %: 67 %
Platelet Count: 178 10*3/uL (ref 150–400)
RBC: 3.94 MIL/uL (ref 3.87–5.11)
RDW: 17 % — ABNORMAL HIGH (ref 11.5–15.5)
WBC Count: 5 10*3/uL (ref 4.0–10.5)
nRBC: 0.4 % — ABNORMAL HIGH (ref 0.0–0.2)

## 2020-11-16 LAB — CMP (CANCER CENTER ONLY)
ALT: 16 U/L (ref 0–44)
AST: 19 U/L (ref 15–41)
Albumin: 3.9 g/dL (ref 3.5–5.0)
Alkaline Phosphatase: 57 U/L (ref 38–126)
Anion gap: 7 (ref 5–15)
BUN: 17 mg/dL (ref 6–20)
CO2: 35 mmol/L — ABNORMAL HIGH (ref 22–32)
Calcium: 9.3 mg/dL (ref 8.9–10.3)
Chloride: 94 mmol/L — ABNORMAL LOW (ref 98–111)
Creatinine: 0.83 mg/dL (ref 0.44–1.00)
GFR, Estimated: >60 mL/min (ref >60)
Glucose, Bld: 103 mg/dL — ABNORMAL HIGH (ref 70–99)
Potassium: 5 mmol/L (ref 3.5–5.1)
Sodium: 136 mmol/L (ref 135–145)
Total Bilirubin: 0.6 mg/dL (ref 0.3–1.2)
Total Protein: 7.3 g/dL (ref 6.5–8.1)

## 2020-11-16 NOTE — Progress Notes (Signed)
[place] OFFICE PROGRESS NOTE   Diagnosis: Non-small cell lung cancer  INTERVAL HISTORY:   Gina Cervantes returns as scheduled.  She completed another cycle of Alimta/pembrolizumab 10/18/2020.  No rash or diarrhea.  She is more short of breath, reports a dry cough.  Symptoms worsen when she lays flat.  She is more fatigued.  She denies pain.  Objective:  Vital signs in last 24 hours:  Blood pressure 119/73, pulse 82, temperature 97.8 F (36.6 C), temperature source Oral, resp. rate 20, height _0  (1.651 m), weight 161 lb (73 kg), last menstrual period 04/07/2008, SpO2 99 %.    HEENT: No thrush or ulcers. Resp: Rhonchi right lung field.  No respiratory distress. Cardio: Regular rate and rhythm. GI: No hepatosplenomegaly. Vascular: Left lower leg is slightly larger than the right lower leg.  Calves are soft and nontender.   Lab Results:  Lab Results  Component Value Date   WBC 5.0 11/16/2020   HGB 15.6 (H) 11/16/2020   HCT 47.1 (H) 11/16/2020   MCV 119.5 (H) 11/16/2020   PLT 178 11/16/2020   NEUTROABS 3.4 11/16/2020    Imaging:  No results found.  Medications: I have reviewed the patient's current medications.  Assessment/Plan: Anal cancer-left anal margin/anal canal mass, status post a biopsy on 08/09/2012 confirming invasive moderately differentiated squamous cell carcinoma,p16 positive.   -Staging PET scan 08/20/2012-hypermetabolic anal mass, no hypermetabolic metastases   -Initiation of concurrent radiation with cycle 1 of mitomycin C./5-fluorouracil 09/02/2012.   -She completed cycle 2 5-fluorouracil/mitomycin C. beginning 10/01/2012.   -She completed radiation 10/10/2012.   2. Right breast cancer 2009, ER positive, PR positive, HER-2 positive. She is status post neoadjuvant AC x4 cycles followed by Taxol/Herceptin. She underwent a right lumpectomy and sentinel lymph node biopsy 09/22/2008 with no evidence of residual invasive carcinoma and 5  negative sentinel lymph nodes. She then completed right breast radiation. She began tamoxifen 12/15/2008 and completed adjuvant Herceptin 05/25/2009. The tamoxifen was discontinued and she was switched to Arimidex in July 2013.  Arimidex discontinued in mid July 2019. 3. History of enlargement of the right tonsil.   4. Nonspecific pulmonary nodules without hypermetabolic activity on the PET scan 08/20/2012. Chest CT 10/21/2012 with scattered pulmonary nodules including nodules that were not present in 2010. Annual surveillance was recommended. Follow-up chest CT 10/24/2013 with scattered groundglass densities again noted in both lungs mostly unchanged when compared to the previous study. A sub-solid right lower lobe nodule had increased central density. The size was unchanged.The nodule has been present since 2009. 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 No EGFR, ALK, BRAF, RET, ERBB2, or ROS1 alteration CT chest 11/14/2016-status post left upper lobe wedge resection, stable right lung nodules Chest CT 05/02/2017-stable bilateral solid and groundglass nodules, stable mild mediastinal lymphadenopathy CT chest 12/13/2017- enlargement of right upper lobe nodule, other  lung nodules and chest lymph  node stable PET 01/09/2018- right upper lobe nodule and right lower lobe nodule have enlarged since 2018 and have low level metabolic activity increased size of an 8 mm right upper lobe nodule without increased metabolic activity, stable activity and a mildly enlarged lower paratracheal node, no evidence of metastatic disease in the abdomen or pelvis CT chest 04/22/2018- no new lung nodules, dominant right lung nodules slightly increased in size Bronchoscopy/EBUS 05/04/2019 with biopsy of level 7 node, biopsy of right lower lobe nodule, FNA of right lower lobe nodule-negative CT biopsy of right upper lobe nodule 06/07/2018- adenocarcinoma with lepidic and acinar patterns SBRT to the dominant right upper lobe nodule 07/23/2018-07/30/2018 CT 09/16/2018-slight increase in size of medial right upper lobe nodule-possibly secondary to interval radiation, other nodules are unchanged CT 12/25/2018- medial right upper lobe nodule has decreased in size slightly, multiple additional solid and groundglass nodules are stable CT 06/09/2019-enlarging right lower lobe nodule, enlarging subsolid nodule, new right upper lobe nodule SBRT to right lower lobe lung nodule, 3 fractions 07/08/2019-07/14/2019 CT 12/05/2019-dominant right lower lobe lesion stable to slightly decreased in size, progressive solid component associated with a dominant right upper lung nodule, mild progression of a left upper lobe nodule, new subsolid anterior medial left upper lobe nodule Cycle 1 Alimta/carboplatin/pembrolizumab 02/04/2020 Cycle 2 Alimta/carboplatin/pembrolizumab 02/23/2020 Cycle 3 Alimta/carboplatin/pembrolizumab 03/15/2020 Cycle 4 Alimta/carboplatin/pembrolizumab 04/05/2020 (Alimta and carboplatin dose reduced secondary to anemia) CT chest 04/12/2020-decreased size of solid right lower lobe and right upper lobe nodules, groundglass nodules in the left lung unchanged Cycle 5 Alimta/pembrolizumab 05/03/2020 Cycle 6 Alimta/pembrolizumab  05/21/2020 Cycle 7 Alimta/pembrolizumab 06/14/2020 Cycle 8 Alimta/pembrolizumab 07/05/2020 CT chest 07/22/2020-stable nodules, no evidence of disease progression, unchanged multifocal groundglass nodules suspicious for multifocal adenocarcinoma. Cycle 9 Alimta/Pembrolizumab 07/26/2020 Cycle 10 Alimta/Pembrolizumab 08/16/2020 Cycle 11 Alimta/Pembrolizumab 09/06/2020 Cycle 12 Alimta/pembrolizumab 09/27/2020 Cycle 13 Alimta/pembrolizumab 10/18/2020 CT chest 11/12/2020-enlarging soft tissue density right upper lobe medially adjacent to the mediastinum measuring 2.7 x 2.1 cm, concerning for recurrent tumor.  Stable radiation changes involving the right upper lobe and right lower lobe.  Stable scattered small solid and subsolid pulmonary nodules bilaterally.  New right-sided pleural effusion.  Stable borderline enlarged mediastinal lymph nodes. 5.  Baker's cyst left leg, pain and swelling in the left calf, negative Doppler 01/24/2020 and 01/29/2020-evaluated by orthopedics and underwent aspiration of the cyst 01/29/2000, completed a course of Keflex 6.  Anemia secondary to chemotherapy, status post 2 units of packed red blood cells on 04/10/2020-improved        Disposition: Gina Cervantes appears stable.  She has completed 13 cycles of Alimta/Pembrolizumab.  Recent restaging chest CT shows a new right-sided pleural effusion and enlarging soft tissue density right upper lobe adjacent to the mediastinum.  Dr. Benay Spice reviewed the results/images with her and her husband at today's appointment.  They understand the findings most likely represent disease progression.  Dr. Benay Spice recommends discontinuation of Alimta/Pembrolizumab.  She may be a candidate for the sotorasib trial for patients with K-ras  G12C mutated non-small cell lung cancer.  We will contact the research department.  In addition we are referring her Dr. Valeta Harms to evaluate the dyspnea/cough.  She will return for follow-up in approximately 2 weeks.  Patient  seen with Dr. Benay Spice.    Ned Card ANP/GNP-BC   11/16/2020  11:57 AM  This was a shared visit Ned Card.  Gina Cervantes was interviewed and examined.  We discussed the CT findings and reviewed the images with her.  She has evidence of disease progression at the right hilum and there is a new right pleural effusion.  We decided to place treatment with Alimta and pembrolizumab on hold.  The resected left upper lobe tumor in 2018 had a K-ras G12c mutation.  We will refer her to consider enrollment in the lungmap study with sotorasib.  We will also refer her to Dr. Valeta Harms for evaluation of dyspnea.  She may benefit from a bronchoscopy to evaluate for endobronchial disease.  The right pleural effusion is small and I doubt this is causing symptoms at present.  The dyspnea is most likely secondary to COPD in conjunction with non-small cell lung cancer and previous chest radiation.  I was present for greater than 50% of today's visit.  I performed medical decision making.  Julieanne Manson, MD

## 2020-11-17 ENCOUNTER — Encounter: Payer: Self-pay | Admitting: *Deleted

## 2020-11-17 NOTE — Progress Notes (Signed)
Faxed referral order, demographics and chart information to Dr. Leory Plowman Icard (pul) at [phone].

## 2020-11-23 ENCOUNTER — Telehealth: Payer: Self-pay | Admitting: Medical Oncology

## 2020-11-23 NOTE — Telephone Encounter (Signed)
LUNGMAP: A MASTER PROTOCOL TO EVALUATE BIOMARKER-DRIVEN THERAPIES AND IMMUNOTHERAPIES IN PREVIOUSLY-TREATED NON-SMALL CELL LUNG CANCER (Lung-MAP Screening Study)   Outgoing call: Patient was referred to study by NP, Ned Card. Patient confirms that NP and Dr. Benay Spice spoke to her about the study and patient expressed interest in knowing more. I spoke with patient this afternoon regarding study and gave her a brief explanation of what this study was about. Patient has concerns regarding what her insurance would cover and I advised her to discuss this with her insurance once she's had a chance to review the study information. I asked patient if it was ok for me to email her the LungMap consent form for her review and patient gave her verbal consent. LungMay ICF, Protocol Version Date: 07/07/19, emailed to patient at her confirmed email address. Patient knows study participation is voluntary as well as that there are eligibility criteria that would need to be verified. Patient was thanked for her time and encouraged  to call Dr. Benay Spice or myself with any questions she may have.  Maxwell Marion, RN, BSN, [facility] Clinical Research 11/23/2020 4:52 PM

## 2020-11-29 ENCOUNTER — Telehealth: Payer: Self-pay | Admitting: Medical Oncology

## 2020-11-29 NOTE — Telephone Encounter (Signed)
LUNGMAP: A MASTER PROTOCOL TO EVALUATE BIOMARKER-DRIVEN THERAPIES AND IMMUNOTHERAPIES IN PREVIOUSLY-TREATED NON-SMALL CELL LUNG CANCER (Lung-MAP Screening Study)   Outgoing call: Follow up call to patient to confirm that she had received my email with the study consent form for her review, as well as, to answer any questions she may have. Patient confirms receipt and denies having any questions at this time. Patient states she and her husband spoke about the study and she states that she has not made a firm decision regarding study and would like to discuss with her pulmonologist and Dr. Benay Spice for their input. I gave patient my understanding and encouraged her to call me with questions or concerns. Patient thanked for her time.  Maxwell Marion, RN, BSN, [facility] Clinical Research 11/29/2020 3:43 PM

## 2020-12-01 ENCOUNTER — Telehealth: Payer: Self-pay | Admitting: Pharmacy Technician

## 2020-12-01 ENCOUNTER — Telehealth: Payer: Self-pay | Admitting: Pharmacist

## 2020-12-01 ENCOUNTER — Encounter: Payer: Self-pay | Admitting: Oncology

## 2020-12-01 ENCOUNTER — Encounter: Payer: Self-pay | Admitting: Pulmonary Disease

## 2020-12-01 ENCOUNTER — Telehealth: Payer: Self-pay | Admitting: Pulmonary Disease

## 2020-12-01 ENCOUNTER — Other Ambulatory Visit (HOSPITAL_COMMUNITY): Payer: Self-pay

## 2020-12-01 ENCOUNTER — Ambulatory Visit (INDEPENDENT_AMBULATORY_CARE_PROVIDER_SITE_OTHER): Payer: BC Managed Care – PPO | Admitting: Pulmonary Disease

## 2020-12-01 ENCOUNTER — Other Ambulatory Visit: Payer: Self-pay

## 2020-12-01 ENCOUNTER — Inpatient Hospital Stay (HOSPITAL_BASED_OUTPATIENT_CLINIC_OR_DEPARTMENT_OTHER): Payer: BC Managed Care – PPO | Admitting: Oncology

## 2020-12-01 VITALS — BP 102/67 | HR 108 | Temp 98.2°F | Resp 20 | Ht 65.0 in | Wt 161.2 lb

## 2020-12-01 VITALS — BP 98/60 | Ht 65.5 in | Wt 161.6 lb

## 2020-12-01 DIAGNOSIS — C3412 Malignant neoplasm of upper lobe, left bronchus or lung: Secondary | ICD-10-CM | POA: Diagnosis not present

## 2020-12-01 DIAGNOSIS — Z170 Estrogen receptor positive status [ER+]: Secondary | ICD-10-CM | POA: Diagnosis not present

## 2020-12-01 DIAGNOSIS — C3431 Malignant neoplasm of lower lobe, right bronchus or lung: Secondary | ICD-10-CM | POA: Diagnosis not present

## 2020-12-01 DIAGNOSIS — Z923 Personal history of irradiation: Secondary | ICD-10-CM | POA: Diagnosis not present

## 2020-12-01 DIAGNOSIS — Z85048 Personal history of other malignant neoplasm of rectum, rectosigmoid junction, and anus: Secondary | ICD-10-CM

## 2020-12-01 DIAGNOSIS — R918 Other nonspecific abnormal finding of lung field: Secondary | ICD-10-CM

## 2020-12-01 DIAGNOSIS — J90 Pleural effusion, not elsewhere classified: Secondary | ICD-10-CM | POA: Diagnosis not present

## 2020-12-01 DIAGNOSIS — Z9221 Personal history of antineoplastic chemotherapy: Secondary | ICD-10-CM | POA: Diagnosis not present

## 2020-12-01 DIAGNOSIS — R0902 Hypoxemia: Secondary | ICD-10-CM

## 2020-12-01 DIAGNOSIS — R0602 Shortness of breath: Secondary | ICD-10-CM | POA: Diagnosis not present

## 2020-12-01 DIAGNOSIS — Z853 Personal history of malignant neoplasm of breast: Secondary | ICD-10-CM

## 2020-12-01 DIAGNOSIS — Z85118 Personal history of other malignant neoplasm of bronchus and lung: Secondary | ICD-10-CM

## 2020-12-01 MED ORDER — BREZTRI AEROSPHERE 160-9-4.8 MCG/ACT IN AERO: 2.0000 | INHALATION_SPRAY | Freq: Two times a day (BID) | RESPIRATORY_TRACT | 0 refills | Status: DC

## 2020-12-01 MED ORDER — SOTORASIB 120 MG PO TABS
960.0000 mg | ORAL_TABLET | Freq: Every day | ORAL | 0 refills | Status: DC
  Filled 2020-12-01: qty 240, fill #0

## 2020-12-01 NOTE — Telephone Encounter (Signed)
Oral Oncology Pharmacist Encounter  Received new prescription for Lumakras (sotorasib) for the treatment of progressive NSCLC, KRAS G12C, planned duration until disease progression or unacceptable drug toxicity. Planned start 12/08/20.  CMP from 11/16/20 assessed, no relevant lab abnormalities. **LFTs should be monitored every 3 weeks for the first 3 months of treatment, then once a month. Prescription dose and frequency assessed.   Current medication list in Epic reviewed, no DDIs with sotorasib identified.  Evaluated chart and no patient barriers to medication adherence identified.   Prescription has been e-scribed to the [facility] for benefits analysis and approval.  Oral Oncology Clinic will continue to follow for insurance authorization, copayment issues, initial counseling and start date.  Patient agreed to treatment on 12/01/20 per MD documentation.  Darl Pikes, PharmD, BCPS, BCOP, CPP Hematology/Oncology Clinical Pharmacist Practitioner [facility] [place] [facility] [phone]  12/01/2020 2:46 PM

## 2020-12-01 NOTE — Telephone Encounter (Signed)
Oral Oncology Patient Advocate Encounter  Prior Authorization for Gina Cervantes has been approved.    PA# [id number] Effective dates: 12/01/20 through 11/30/21  Patients co-pay is $10,973.06.  Oral Oncology Clinic will continue to follow.   [name] Phone [phone] Fax [phone] 12/01/2020 4:27 PM

## 2020-12-01 NOTE — Progress Notes (Signed)
Synopsis: Referred in July 2022 for abnormal CT chest, oncology: Dr. Benay Spice, PCP: [facility]*  Subjective:   PATIENT ID: Gina Cervantes GENDER: female DOB: [birth year], MRN: [id number]  Chief Complaint  Patient presents with   Consult    Treatment for lung cancer for the last 4 years , atypical symptoms 6 months progressively worsening shortness of breath and exhaustion referred by Oncology    60 year old female, past medical history of anal cancer in 2014, breast cancer in 2009, lung cancer in 2018.  Patient has underwent chemotherapy as well as radiation treatments.  She is followed by Dr. Benay Spice and Ned Card, NP at the [facility].  She is currently undergoing treatment for her non-small cell lung cancer.  She completed an additional cycle of Alimta plus pembrolizumab in June 2022.  Overall her history includes a anal cancer, left anal margin, anal canal mass which was a moderately differentiated squamous cell carcinoma, p16 positive no evidence of hypermetabolic metastasis treated with concurrent radiation and chemotherapy.  In 2009 she had a right breast cancer that was ER/PR positive, HER2 positive.  She had a subsolid right lower lobe nodule that has slowly increased in size.  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  Ultimately in December 2020 underwent a biopsy of the level 7 lymph node that was negative.  Had a CT-guided biopsy of a right upper lobe nodule that had lipidic adenocarcinoma with acinar patterns.  She had SBRT treatments to a right lower lobe nodule that was slowly enlarging.  In July 2021 she had a lower lobe lesion that had slightly decreased in size but had progressive component associated with a dominant right upper lobe nodule.  Patient was started on treatments with Alimta, carboplatinum plus pembrolizumab.  Patient had a  repeat CT scan of the chest in July 2022 which showed enlarging soft tissue density in the right upper lobe medial adjacent to the mediastinum measuring 2.7 x 2.1 cm concerning for recurrent malignancy.  Additionally there is a small new right sided pleural effusion mediastinal adenopathy was stable.  Former smoker quit in 2009, 20+ years, <1ppd.    Oncology History  Lung cancer, left  upper lobe ([place])  07/05/2016 Initial Diagnosis   Lung cancer, left  upper lobe ([place])    02/04/2020 -  Chemotherapy    Patient is on Treatment Plan: LUNG CARBOPLATIN / PEMETREXED / PEMBROLIZUMAB Q21D INDUCTION X 4 CYCLES / MAINTENANCE PEMETREXED + PEMBROLIZUMAB          Past Medical History:  Diagnosis Date   Anal cancer ([place]) 08/09/2012   Squamous cell   Anxiety    PANIC ATTACKS   Arthritis    Breast cancer ([place]) 2009   ER+PR+HER-2+   Ductal carcinoma ([place]) 02/2008    invasive    Heart palpitations    History of radiation therapy 09/02/12-10/10/12   anal 50.4Gy   History of shingles 10/2014    HPV (human papilloma virus) anogenital infection    vulvar/ freezing hx   Hx of radiation therapy 2020   Lung nodule    Malignant neoplasm of upper lobe of left lung ([place]) 06/2016   Personal history of chemotherapy    Personal history of radiation therapy 2010   Pneumonia    NO RECENT PROBLEMS   PONV (postoperative nausea and vomiting)    Hypotension  Radiation 10/21/08-12/08/08   Right breast 6240 cGy   Shingles 2017   Status post chemotherapy 02/2008   Taxol/Herceptin     Family History  Problem Relation Age of Onset   Lung cancer Father    Stroke Mother    Breast cancer Neg Hx      Past Surgical History:  Procedure Laterality Date   BREAST BIOPSY Right 02/04/2008   malignant   BREAST LUMPECTOMY Right 2010   malignant   BREAST LUMPECTOMY WITH AXILLARY LYMPH NODE BIOPSY Right 5/10   Dr. Marlou Starks   CESAREAN SECTION     COLONOSCOPY W/ POLYPECTOMY     EVALUATION UNDER ANESTHESIA WITH ANAL  FISTULECTOMY N/A 08/09/2012   Procedure: EXAM UNDER ANESTHESIA AND BIOPSY OF ANAL MASS;  Surgeon: Odis Hollingshead, MD;  Location: [facility];  Service: General;  Laterality: N/A;   KNEE ARTHROSCOPY Left    left knee   LUNG LOBECTOMY Left    PORTACATH PLACEMENT  03/2008   dr. Marlou Starks    REMOVAL PORTACATH  2011   VIDEO ASSISTED THORACOSCOPY (VATS)/WEDGE RESECTION Left 07/05/2016   Procedure: VIDEO ASSISTED THORACOSCOPY (VATS)/WEDGE RESECTION left upper lobe,  lymph node dissection and placement of OnQ catheter;  Surgeon: [name], MD;  Location: [place];  Service: Thoracic;  Laterality: Left;   VIDEO BRONCHOSCOPY N/A 07/05/2016   Procedure: VIDEO BRONCHOSCOPY;  Surgeon: [name], MD;  Location: [facility];  Service: Thoracic;  Laterality: N/A;   VIDEO BRONCHOSCOPY N/A 05/03/2018   Procedure: VIDEO BRONCHOSCOPY;  Surgeon: [name], MD;  Location: [place];  Service: Thoracic;  Laterality: N/A;   VIDEO BRONCHOSCOPY WITH ENDOBRONCHIAL NAVIGATION N/A 05/03/2018   Procedure: VIDEO BRONCHOSCOPY WITH ENDOBRONCHIAL NAVIGATION WITH BIOPSY;  Surgeon: [name], MD;  Location: [place];  Service: Thoracic;  Laterality: N/A;   VIDEO BRONCHOSCOPY WITH ENDOBRONCHIAL ULTRASOUND N/A 05/03/2018   Procedure: VIDEO BRONCHOSCOPY WITH ENDOBRONCHIAL ULTRASOUND;  Surgeon: [name], MD;  Location: [place];  Service: Thoracic;  Laterality: N/A;    Social History   Socioeconomic History   Marital status: Married    Spouse name: Not on file   Number of children: Not on file   Years of education: Not on file   Highest education level: Not on file  Occupational History    Employer: [employer], [employer]    Comment: Realtor  Tobacco Use   Smoking status: Former    Years: 8.00    Types: Cigarettes    Quit date: 2015    Years since quitting: 7.5   Smokeless tobacco: Never   Tobacco comments:    stopped smoking cigarettes Jan. 2018  Vaping Use   Vaping Use: Never used   Substance and Sexual Activity   Alcohol use: Yes    Alcohol/week: 5.0 - 20.0 standard drinks    Types: 5 - 20 Glasses of wine per week   Drug use: No   Sexual activity: Yes    Partners: Male    Birth control/protection: Other-see comments    Comment: vasectomy  Other Topics Concern   Not on file  Social History Narrative   Married   Lost a son age 59 this year and husband's brother, still going through grieving process.   No family history of breast or ovarian cancer.   Employed as Patent examiner Strain: Not on file  Food Insecurity: Not on file  Transportation Needs: Not on file  Physical Activity: Not on file  Stress: Not on file  Social Connections: Not on file  Intimate Partner Violence: Not on file     Allergies  Allergen Reactions   No Known Allergies      Outpatient Medications Prior to Visit  Medication Sig Dispense Refill   albuterol (VENTOLIN HFA) 108 (90 Base) MCG/ACT inhaler TAKE 2 PUFFS BY MOUTH EVERY 6 HOURS AS NEEDED FOR WHEEZE OR SHORTNESS OF BREATH 18 each 2   CALCIUM PO Take 1 tablet by mouth daily.     Cyanocobalamin (VITAMIN B-12 PO) Take 2 tablets by mouth daily. Gummies-does not know dose     folic acid (FOLVITE) 1 MG tablet TAKE 1 TABLET BY MOUTH EVERY DAY 90 tablet 1   loratadine (CLARITIN) 10 MG tablet Take 10 mg by mouth as needed for allergies.     LORazepam (ATIVAN) 0.5 MG tablet Take 1 tablet (0.5 mg total) by mouth every 8 (eight) hours as needed for anxiety (or nausea). 60 tablet 0   Multiple Vitamins-Minerals (MULTIVITAMIN WITH MINERALS) tablet Take 1 tablet by mouth daily.     Multiple Vitamins-Minerals (ZINC PO) Take 1 tablet by mouth daily.     POTASSIUM CHLORIDE PO Take 2 tablets by mouth daily.     Thiamine HCl (VITAMIN B-1) 250 MG tablet Take 250 mg by mouth daily.     No facility-administered medications prior to visit.    Review of Systems  Constitutional:  Negative for chills,  fever, malaise/fatigue and weight loss.  HENT:  Negative for hearing loss, sore throat and tinnitus.   Eyes:  Negative for blurred vision and double vision.  Respiratory:  Positive for cough and shortness of breath. Negative for hemoptysis, sputum production, wheezing and stridor.   Cardiovascular:  Negative for chest pain, palpitations, orthopnea, leg swelling and PND.  Gastrointestinal:  Negative for abdominal pain, constipation, diarrhea, heartburn, nausea and vomiting.  Genitourinary:  Negative for dysuria, hematuria and urgency.  Musculoskeletal:  Negative for joint pain and myalgias.  Skin:  Negative for itching and rash.  Neurological:  Negative for dizziness, tingling, weakness and headaches.  Endo/Heme/Allergies:  Negative for environmental allergies. Does not bruise/bleed easily.  Psychiatric/Behavioral:  Negative for depression. The patient is not nervous/anxious and does not have insomnia.   All other systems reviewed and are negative.   Objective:  Physical Exam Vitals reviewed.  Constitutional:      General: She is not in acute distress.    Appearance: She is well-developed.  HENT:     Head: Normocephalic and atraumatic.  Eyes:     General: No scleral icterus.    Conjunctiva/sclera: Conjunctivae normal.     Pupils: Pupils are equal, round, and reactive to light.  Neck:     Vascular: No JVD.     Trachea: No tracheal deviation.  Cardiovascular:     Rate and Rhythm: Normal rate and regular rhythm.     Heart sounds: Normal heart sounds. No murmur heard. Pulmonary:     Effort: Pulmonary effort is normal. No tachypnea, accessory muscle usage or respiratory distress.     Breath sounds: No stridor. No wheezing, rhonchi or rales.     Comments: Diminished breath sounds bilaterally Abdominal:     General: Bowel sounds are normal. There is no distension.     Palpations: Abdomen is soft.     Tenderness: There is no abdominal tenderness.  Musculoskeletal:        General: No  tenderness.     Cervical  back: Neck supple.  Lymphadenopathy:     Cervical: No cervical adenopathy.  Skin:    General: Skin is warm and dry.     Capillary Refill: Capillary refill takes less than 2 seconds.     Findings: No rash.  Neurological:     Mental Status: She is alert and oriented to person, place, and time.  Psychiatric:        Behavior: Behavior normal.     Vitals:   12/01/20 0936  BP: 98/60  SpO2: 91%  Weight: 161 lb 9.6 oz (73.3 kg)  Height: 5' 5.5" (1.664 m)   91% on 2 L BMI Readings from Last 3 Encounters:  12/01/20 26.48 kg/m  11/16/20 26.79 kg/m  10/18/20 26.79 kg/m   Wt Readings from Last 3 Encounters:  12/01/20 161 lb 9.6 oz (73.3 kg)  11/16/20 161 lb (73 kg)  10/18/20 161 lb (73 kg)     CBC    Component Value Date/Time   WBC 5.0 11/16/2020 1127   WBC 5.4 07/05/2020 1005   RBC 3.94 11/16/2020 1127   HGB 15.6 (H) 11/16/2020 1127   HGB 13.5 01/02/2013 1510   HCT 47.1 (H) 11/16/2020 1127   HCT 39.1 01/02/2013 1510   PLT 178 11/16/2020 1127   PLT 240 01/02/2013 1510   MCV 119.5 (H) 11/16/2020 1127   MCV 105.7 (H) 01/02/2013 1510   MCH 39.6 (H) 11/16/2020 1127   MCHC 33.1 11/16/2020 1127   RDW 17.0 (H) 11/16/2020 1127   RDW 13.6 01/02/2013 1510   LYMPHSABS 0.7 11/16/2020 1127   LYMPHSABS 1.6 01/02/2013 1510   MONOABS 0.7 11/16/2020 1127   MONOABS 0.7 01/02/2013 1510   EOSABS 0.1 11/16/2020 1127   EOSABS 0.2 01/02/2013 1510   BASOSABS 0.1 11/16/2020 1127   BASOSABS 0.0 01/02/2013 1510     Chest Imaging: 11/12/2020 CT chest: Enlarging soft tissue density, right upper lobe medial adjacent to the mediastinum measuring 2.7 x 2.1 cm.  Additionally has a right lower lobe lesion as well as a left lower lobe lesion of which are smaller but still concerning for malignancy. The patient's images have been independently reviewed by me.    Pulmonary Functions Testing Results: PFT Results Latest Ref Rng & Units 06/30/2016  FVC-Pre L 2.03   FVC-Predicted Pre % 56  FVC-Post L 2.57  FVC-Predicted Post % 71  Pre FEV1/FVC % % 73  Post FEV1/FCV % % 73  FEV1-Pre L 1.48  FEV1-Predicted Pre % 52  FEV1-Post L 1.88  DLCO uncorrected ml/min/mmHg 17.68  DLCO UNC% % 69  DLVA Predicted % 103  TLC L 5.14  TLC % Predicted % 98  RV % Predicted % 152    FeNO:   Pathology:   Echocardiogram:   Heart Catheterization:     Assessment & Plan:     ICD-10-CM   1. Lung nodules  R91.8 Ambulatory referral to Pulmonology    Procedural/ Surgical Case Request: VIDEO BRONCHOSCOPY WITH ENDOBRONCHIAL NAVIGATION    ECHOCARDIOGRAM COMPLETE    Ambulatory Referral for DME    Budeson-Glycopyrrol-Formoterol (BREZTRI AEROSPHERE) 160-9-4.8 MCG/ACT AERO    2. SOB (shortness of breath)  R06.02 ECHOCARDIOGRAM COMPLETE    Ambulatory Referral for DME    Budeson-Glycopyrrol-Formoterol (BREZTRI AEROSPHERE) 160-9-4.8 MCG/ACT AERO    3. Hypoxemia  R09.02     4. Pleural effusion  J90     5. History of anal cancer  Z85.048     6. History of breast cancer  Z85.3  7. History of lung cancer  Z85.118       Discussion:  This is a 60 year old female with a very complex medical history.  Multiple malignancies in the past.  She has a abnormal CT image with 3 separate areas of concern.  She has a right upper lobe with large medial lesion.  She has a right lower lobe enlarging subsolid lesion as well as a subsolid lesion within the left lower lobe.  All of which could represent recurrence of malignancy due to her history of lung cancer or recurrence of either her other malignancies breast or anal cancer.  Also today in the office she is hypoxic needing oxygen.  We placed her on 2 L nasal cannula.  O2 sats on presentation were in the low 80s.  She also has a small right-sided pleural effusion.  Plan: New orders and DME supply for oxygen needs. 2 L nasal cannula continuous Echocardiogram for evaluation of her cardiac function after receiving chemotherapy.   Also has a small right-sided effusion.  We could be dealing with a cardiomyopathy that is causing her to be hypoxemic and I would like to rule this out before considerations for general anesthesia and biopsy of the abnormal lesions defined above. We discussed the risk benefits and alternatives of general anesthesia and undergoing robotic assisted bronchoscopy. I think all 3 locations are accessible for tissue sampling. She is agreeable to this. We will get a echocardiogram first.  Additionally due to her history of smoking.  She may very well have underlying COPD not sure of this at this time.  We will give her a trial of bronchodilators. Breztri samples given today.  Tentative date for bronchoscopy on 12/28/2020.  I discussed case with Dr. Benay Spice her primary oncologist.   Current Outpatient Medications:    albuterol (VENTOLIN HFA) 108 (90 Base) MCG/ACT inhaler, TAKE 2 PUFFS BY MOUTH EVERY 6 HOURS AS NEEDED FOR WHEEZE OR SHORTNESS OF BREATH, Disp: 18 each, Rfl: 2   CALCIUM PO, Take 1 tablet by mouth daily., Disp: , Rfl:    Cyanocobalamin (VITAMIN B-12 PO), Take 2 tablets by mouth daily. Gummies-does not know dose, Disp: , Rfl:    folic acid (FOLVITE) 1 MG tablet, TAKE 1 TABLET BY MOUTH EVERY DAY, Disp: 90 tablet, Rfl: 1   loratadine (CLARITIN) 10 MG tablet, Take 10 mg by mouth as needed for allergies., Disp: , Rfl:    LORazepam (ATIVAN) 0.5 MG tablet, Take 1 tablet (0.5 mg total) by mouth every 8 (eight) hours as needed for anxiety (or nausea)., Disp: 60 tablet, Rfl: 0   Multiple Vitamins-Minerals (MULTIVITAMIN WITH MINERALS) tablet, Take 1 tablet by mouth daily., Disp: , Rfl:    Multiple Vitamins-Minerals (ZINC PO), Take 1 tablet by mouth daily., Disp: , Rfl:    POTASSIUM CHLORIDE PO, Take 2 tablets by mouth daily., Disp: , Rfl:    Thiamine HCl (VITAMIN B-1) 250 MG tablet, Take 250 mg by mouth daily., Disp: , Rfl:   I spent 65 minutes dedicated to the care of this patient on the date of  this encounter to include pre-visit review of records, face-to-face time with the patient discussing conditions above, post visit ordering of testing, clinical documentation with the electronic health record, making appropriate referrals as documented, and communicating necessary findings to members of the patients care team.   Garner Nash, DO [facility] 12/01/2020 10:15 AM

## 2020-12-01 NOTE — Telephone Encounter (Signed)
The following has been scheduled & patient has been made aware:  COVID Test - 8/19 between 8 AM & 3 PM at [place] @ 7:30, pt to check in by 5:30 Super D CT-[place] w/ [facility] is burning disk & send to my attention; I will forward to [place] once received.

## 2020-12-01 NOTE — Patient Instructions (Signed)
Thank you for visiting Dr. Valeta Harms at [facility]. Today we recommend the following:  Orders Placed This Encounter  Procedures   Procedural/ Surgical Case Request: [place]   Ambulatory referral to Pulmonology   ECHOCARDIOGRAM COMPLETE   Tentative bronchoscopy date: 12/28/2020 ECHO pending.   New DME Supplies for O2 needs  Samples of Breztri  Return in about 6 weeks (around 01/12/2021).    Please do your part to reduce the spread of COVID-19.

## 2020-12-01 NOTE — Telephone Encounter (Signed)
Oral Oncology Patient Advocate Encounter   Received notification from [facility] that prior authorization for Lumakras is required.   PA submitted on CoverMyMeds Key [id number] Status is pending   Oral Oncology Clinic will continue to follow.  Gina Cervantes Phone [phone] Fax [phone] 12/01/2020 4:08 PM

## 2020-12-01 NOTE — Progress Notes (Signed)
Ultrasound chest procedure Note  Gina Cervantes  [id number]  [birth year]  Date:12/01/20  Time:7:07 PM   Provider Performing:Matej Sappenfield L Keona Bilyeu   Procedure: Ultrasound chest  Indication(s) Pleural Effusion  Consent Risks of the procedure as well as the alternatives and risks of each were explained to the patient and/or caregiver.  Consent for the procedure was obtained and is signed in the bedside chart  Procedure Description Ultrasound was used for visualization of the right chest.  There was a visible small amount of right-sided pleural fluid.  It does not appear to have increased in size since her recent CT scan of the chest.  Garner Nash, DO [place] [facility] 12/01/2020 7:08 PM       Ultrasound image: Right-sided small pleural effusion visible.

## 2020-12-01 NOTE — Progress Notes (Signed)
[place] OFFICE PROGRESS NOTE   Diagnosis: Non-small cell lung cancer  INTERVAL HISTORY:   Ms. Ullery returns as scheduled.  She continues to have dyspnea.  She saw Dr. Valeta Harms earlier today.  She reports he prescribed home oxygen and a new inhaler.  She spoke with the research nurse regarding the lung-MAP study.  She does not wish to enroll on the clinical trial.  Objective:  Vital signs in last 24 hours:  Blood pressure 102/67, pulse (!) 108, temperature 98.2 F (36.8 C), temperature source Oral, resp. rate 20, height '5\' 5"'  (1.651 m), weight 161 lb 3.2 oz (73.1 kg), last menstrual period 04/07/2008, SpO2 93 %.    Resp: Inspiratory rhonchi at the right lower posterior chest, rales at the upper anterior chest bilaterally, no respiratory distress Cardio: Regular rate and rhythm GI: No hepatosplenomegaly Vascular: The left lower leg is larger than the right side, no erythema or tenderness   Lab Results:  Lab Results  Component Value Date   WBC 5.0 11/16/2020   HGB 15.6 (H) 11/16/2020   HCT 47.1 (H) 11/16/2020   MCV 119.5 (H) 11/16/2020   PLT 178 11/16/2020   NEUTROABS 3.4 11/16/2020    CMP  Lab Results  Component Value Date   NA 136 11/16/2020   K 5.0 11/16/2020   CL 94 (L) 11/16/2020   CO2 35 (H) 11/16/2020   GLUCOSE 103 (H) 11/16/2020   BUN 17 11/16/2020   CREATININE 0.83 11/16/2020   CALCIUM 9.3 11/16/2020   PROT 7.3 11/16/2020   ALBUMIN 3.9 11/16/2020   AST 19 11/16/2020   ALT 16 11/16/2020   ALKPHOS 57 11/16/2020   BILITOT 0.6 11/16/2020   GFRNONAA >60 11/16/2020   GFRAA >60 01/29/2020    Lab Results  Component Value Date   CEA 5.5 (H) 08/02/2012    Medications: I have reviewed the patient's current medications.   Assessment/Plan:  Anal cancer-left anal margin/anal canal mass, status post a biopsy on 08/09/2012 confirming invasive moderately differentiated squamous cell carcinoma,p16 positive.   -Staging PET scan  08/20/2012-hypermetabolic anal mass, no hypermetabolic metastases   -Initiation of concurrent radiation with cycle 1 of mitomycin C./5-fluorouracil 09/02/2012.   -She completed cycle 2 5-fluorouracil/mitomycin C. beginning 10/01/2012.   -She completed radiation 10/10/2012.   2. Right breast cancer 2009, ER positive, PR positive, HER-2 positive. She is status post neoadjuvant AC x4 cycles followed by Taxol/Herceptin. She underwent a right lumpectomy and sentinel lymph node biopsy 09/22/2008 with no evidence of residual invasive carcinoma and 5 negative sentinel lymph nodes. She then completed right breast radiation. She began tamoxifen 12/15/2008 and completed adjuvant Herceptin 05/25/2009. The tamoxifen was discontinued and she was switched to Arimidex in July 2013.  Arimidex discontinued in mid July 2019. 3. History of enlargement of the right tonsil.   4. Nonspecific pulmonary nodules without hypermetabolic activity on the PET scan 08/20/2012. Chest CT 10/21/2012 with scattered pulmonary nodules including nodules that were not present in 2010. Annual surveillance was recommended. Follow-up chest CT 10/24/2013 with scattered groundglass densities again noted in both lungs mostly unchanged when compared to the previous study. A sub-solid right lower lobe nodule had increased central density. The size was unchanged.The nodule has been present since 2009. CT chest 06/13/2016-a sub-solid 9 mm right upper lobe nodule has enlarged compared to 2015, a right lower lobe irregular nodular density appears more well defined, new vague 5 mm groundglass left upper lobe nodule, left lower lobe 10 mm groundglass nodule is more apparent,  posterior left upper lobe circumscribed solid nodule measures 11 x 10 mm and is new PET scan 06/22/2016-malignant range activity involving the 11 mm left upper lobe nodule, no other hypermetabolic nodules, suspicious sub-solid right lower lobe nodule Wedge resection of a hypermetabolic  left upper lobe nodule on 07/05/2016-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 No EGFR, ALK, BRAF, RET, ERBB2, or ROS1 alteration CT chest 11/14/2016-status post left upper lobe wedge resection, stable right lung nodules Chest CT 05/02/2017-stable bilateral solid and groundglass nodules, stable mild mediastinal lymphadenopathy CT chest 12/13/2017- enlargement of right upper lobe nodule, other lung nodules and chest lymph node stable PET 01/09/2018- right upper lobe nodule and right lower lobe nodule have enlarged since 2018 and have low level metabolic activity increased size of an 8 mm right upper lobe nodule without increased metabolic activity, stable activity and a mildly enlarged lower paratracheal node, no evidence of metastatic disease in the abdomen or pelvis CT chest 04/22/2018- no new lung nodules, dominant right lung nodules slightly increased in size Bronchoscopy/EBUS 05/04/2019 with biopsy of level 7 node, biopsy of right lower lobe nodule, FNA of right lower lobe nodule-negative CT biopsy of right upper lobe nodule 06/07/2018- adenocarcinoma with lepidic and acinar patterns SBRT to the dominant right upper lobe nodule 07/23/2018-07/30/2018 CT 09/16/2018-slight increase in size of medial right upper lobe nodule-possibly secondary to interval radiation, other nodules are unchanged CT 12/25/2018- medial right upper lobe nodule has decreased in size slightly, multiple additional solid and groundglass nodules are stable CT 06/09/2019-enlarging right lower lobe nodule, enlarging subsolid nodule, new right upper lobe nodule SBRT to right lower lobe lung nodule, 3 fractions 07/08/2019-07/14/2019 CT 12/05/2019-dominant right lower lobe lesion stable to slightly decreased in size, progressive solid component associated with a dominant right upper lung nodule, mild progression of a left  upper lobe nodule, new subsolid anterior medial left upper lobe nodule Cycle 1 Alimta/carboplatin/pembrolizumab 02/04/2020 Cycle 2 Alimta/carboplatin/pembrolizumab 02/23/2020 Cycle 3 Alimta/carboplatin/pembrolizumab 03/15/2020 Cycle 4 Alimta/carboplatin/pembrolizumab 04/05/2020 (Alimta and carboplatin dose reduced secondary to anemia) CT chest 04/12/2020-decreased size of solid right lower lobe and right upper lobe nodules, groundglass nodules in the left lung unchanged Cycle 5 Alimta/pembrolizumab 05/03/2020 Cycle 6 Alimta/pembrolizumab 05/21/2020 Cycle 7 Alimta/pembrolizumab 06/14/2020 Cycle 8 Alimta/pembrolizumab 07/05/2020 CT chest 07/22/2020-stable nodules, no evidence of disease progression, unchanged multifocal groundglass nodules suspicious for multifocal adenocarcinoma. Cycle 9 Alimta/Pembrolizumab 07/26/2020 Cycle 10 Alimta/Pembrolizumab 08/16/2020 Cycle 11 Alimta/Pembrolizumab 09/06/2020 Cycle 12 Alimta/pembrolizumab 09/27/2020 Cycle 13 Alimta/pembrolizumab 10/18/2020 CT chest 11/12/2020-enlarging soft tissue density right upper lobe medially adjacent to the mediastinum measuring 2.7 x 2.1 cm, concerning for recurrent tumor.  Stable radiation changes involving the right upper lobe and right lower lobe.  Stable scattered small solid and subsolid pulmonary nodules bilaterally.  New right-sided pleural effusion.  Stable borderline enlarged mediastinal lymph nodes. 5.  Baker's cyst left leg, pain and swelling in the left calf, negative Doppler 01/24/2020 and 01/29/2020-evaluated by orthopedics and underwent aspiration of the cyst 01/29/2000, completed a course of Keflex 6.  Anemia secondary to chemotherapy, status post 2 units of packed red blood cells on 04/10/2020-improved        Disposition: [name] has non-small cell lung cancer.  There is evidence of disease progression on a restaging chest CT 11/12/2020.  She has increased dyspnea/hypoxia, potentially related to COPD versus the lung tumor burden.   She saw Dr. Valeta Harms earlier today and will begin a new inhaler  and home oxygen therapy.  I discussed systemic treatment options with her.  She does not wish to enroll on a clinical trial.  The plan is to begin sotorasib.  We reviewed potential toxicities associated with sotorasib including the chance of nausea, diarrhea, rash, hematologic toxicity, and edema.  She agrees to proceed.  She will be contacted by the Cancer center pharmacist for further discussion regarding side effects associated with sotorasib.  She reports Dr. Valeta Harms plan to perform a bronchoscopy to further evaluate the dyspnea and to biopsy the dominant right lung mass.  This is being scheduled for late August.  The plan is to begin sotorasib on 12/08/2020.  She will return for an office and lab visit on 12/20/2020.  Betsy Coder, MD  12/01/2020  2:07 PM

## 2020-12-01 NOTE — H&P (View-Only) (Signed)
 Synopsis: Referred in July 2022 for abnormal CT chest, oncology: Dr. Sherrill, PCP: By Pa, [facility]*  Subjective:   PATIENT ID: Gina Cervantes GENDER: female DOB: 10/19/1960, MRN: [id number]  Chief Complaint  Patient presents with   Consult    Treatment for lung cancer for the last 4 years , atypical symptoms 6 months progressively worsening shortness of breath and exhaustion referred by Oncology    60-year-old female, past medical history of anal cancer in 2014, breast cancer in 2009, lung cancer in 2018.  Patient has underwent chemotherapy as well as radiation treatments.  She is followed by Dr. Sherrill and Lisa Thomas, NP at the [facility].  She is currently undergoing treatment for her non-small cell lung cancer.  She completed an additional cycle of Alimta plus pembrolizumab in June 2022.  Overall her history includes a anal cancer, left anal margin, anal canal mass which was a moderately differentiated squamous cell carcinoma, p16 positive no evidence of hypermetabolic metastasis treated with concurrent radiation and chemotherapy.  In 2009 she had a right breast cancer that was ER/PR positive, HER2 positive.  She had a subsolid right lower lobe nodule that has slowly increased in size.  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  Ultimately in December 2020 underwent a biopsy of the level 7 lymph node that was negative.  Had a CT-guided biopsy of a right upper lobe nodule that had lipidic adenocarcinoma with acinar patterns.  She had SBRT treatments to a right lower lobe nodule that was slowly enlarging.  In July 2021 she had a lower lobe lesion that had slightly decreased in size but had progressive component associated with a dominant right upper lobe nodule.  Patient was started on treatments with Alimta, carboplatinum plus pembrolizumab.  Patient had a  repeat CT scan of the chest in July 2022 which showed enlarging soft tissue density in the right upper lobe medial adjacent to the mediastinum measuring 2.7 x 2.1 cm concerning for recurrent malignancy.  Additionally there is a small new right sided pleural effusion mediastinal adenopathy was stable.  Former smoker quit in 2009, 20+ years, <1ppd.    Oncology History  Lung cancer, left  upper lobe (HCC)  07/05/2016 Initial Diagnosis   Lung cancer, left  upper lobe (HCC)    02/04/2020 -  Chemotherapy    Patient is on Treatment Plan: LUNG CARBOPLATIN / PEMETREXED / PEMBROLIZUMAB Q21D INDUCTION X 4 CYCLES / MAINTENANCE PEMETREXED + PEMBROLIZUMAB          Past Medical History:  Diagnosis Date   Anal cancer (HCC) 08/09/2012   Squamous cell   Anxiety    PANIC ATTACKS   Arthritis    Breast cancer (HCC) 2009   ER+PR+HER-2+   Ductal carcinoma (HCC) 02/2008    invasive    Heart palpitations    History of radiation therapy 09/02/12-10/10/12   anal 50.4Gy   History of shingles 10/2014    HPV (human papilloma virus) anogenital infection    vulvar/ freezing hx   Hx of radiation therapy 2020   Lung nodule    Malignant neoplasm of upper lobe of left lung (HCC) 06/2016   Personal history of chemotherapy    Personal history of radiation therapy 2010   Pneumonia    NO RECENT PROBLEMS   PONV (postoperative nausea and vomiting)    Hypotension     Radiation 10/21/08-12/08/08   Right breast 6240 cGy   Shingles 2017   Status post chemotherapy 02/2008   Taxol/Herceptin     Family History  Problem Relation Age of Onset   Lung cancer Father    Stroke Mother    Breast cancer Neg Hx      Past Surgical History:  Procedure Laterality Date   BREAST BIOPSY Right 02/04/2008   malignant   BREAST LUMPECTOMY Right 2010   malignant   BREAST LUMPECTOMY WITH AXILLARY LYMPH NODE BIOPSY Right 5/10   Dr. Toth   CESAREAN SECTION     COLONOSCOPY W/ POLYPECTOMY     EVALUATION UNDER ANESTHESIA WITH ANAL  FISTULECTOMY N/A 08/09/2012   Procedure: EXAM UNDER ANESTHESIA AND BIOPSY OF ANAL MASS;  Surgeon: Todd J Rosenbower, MD;  Location: [facility];  Service: General;  Laterality: N/A;   KNEE ARTHROSCOPY Left    left knee   LUNG LOBECTOMY Left    PORTACATH PLACEMENT  03/2008   dr. toth    REMOVAL PORTACATH  2011   VIDEO ASSISTED THORACOSCOPY (VATS)/WEDGE RESECTION Left 07/05/2016   Procedure: VIDEO ASSISTED THORACOSCOPY (VATS)/WEDGE RESECTION left upper lobe,  lymph node dissection and placement of OnQ catheter;  Surgeon: Edward B Gerhardt, MD;  Location: [facility];  Service: Thoracic;  Laterality: Left;   VIDEO BRONCHOSCOPY N/A 07/05/2016   Procedure: VIDEO BRONCHOSCOPY;  Surgeon: Edward B Gerhardt, MD;  Location: [facility];  Service: Thoracic;  Laterality: N/A;   VIDEO BRONCHOSCOPY N/A 05/03/2018   Procedure: VIDEO BRONCHOSCOPY;  Surgeon: Gerhardt, Edward B, MD;  Location: [facility];  Service: Thoracic;  Laterality: N/A;   VIDEO BRONCHOSCOPY WITH ENDOBRONCHIAL NAVIGATION N/A 05/03/2018   Procedure: VIDEO BRONCHOSCOPY WITH ENDOBRONCHIAL NAVIGATION WITH BIOPSY;  Surgeon: Gerhardt, Edward B, MD;  Location: [facility];  Service: Thoracic;  Laterality: N/A;   VIDEO BRONCHOSCOPY WITH ENDOBRONCHIAL ULTRASOUND N/A 05/03/2018   Procedure: VIDEO BRONCHOSCOPY WITH ENDOBRONCHIAL ULTRASOUND;  Surgeon: Gerhardt, Edward B, MD;  Location: [facility];  Service: Thoracic;  Laterality: N/A;    Social History   Socioeconomic History   Marital status: Married    Spouse name: Not on file   Number of children: Not on file   Years of education: Not on file   Highest education level: Not on file  Occupational History    Employer: [employer], [place], [place]    Comment: Realtor  Tobacco Use   Smoking status: Former    Years: 8.00    Types: Cigarettes    Quit date: 2015    Years since quitting: 7.5   Smokeless tobacco: Never   Tobacco comments:    stopped smoking cigarettes Jan. 2018  Vaping Use   Vaping Use: Never used   Substance and Sexual Activity   Alcohol use: Yes    Alcohol/week: 5.0 - 20.0 standard drinks    Types: 5 - 20 Glasses of wine per week   Drug use: No   Sexual activity: Yes    Partners: Male    Birth control/protection: Other-see comments    Comment: vasectomy  Other Topics Concern   Not on file  Social History Narrative   Married   Lost a son age 23 this year and husband's brother, still going through grieving process.   No family history of breast or ovarian cancer.   Employed as Realtor   Social Determinants of Health   Financial Resource Strain: Not on file  Food Insecurity: Not on file  Transportation Needs: Not on file    Physical Activity: Not on file  Stress: Not on file  Social Connections: Not on file  Intimate Partner Violence: Not on file     Allergies  Allergen Reactions   No Known Allergies      Outpatient Medications Prior to Visit  Medication Sig Dispense Refill   albuterol (VENTOLIN HFA) 108 (90 Base) MCG/ACT inhaler TAKE 2 PUFFS BY MOUTH EVERY 6 HOURS AS NEEDED FOR WHEEZE OR SHORTNESS OF BREATH 18 each 2   CALCIUM PO Take 1 tablet by mouth daily.     Cyanocobalamin (VITAMIN B-12 PO) Take 2 tablets by mouth daily. Gummies-does not know dose     folic acid (FOLVITE) 1 MG tablet TAKE 1 TABLET BY MOUTH EVERY DAY 90 tablet 1   loratadine (CLARITIN) 10 MG tablet Take 10 mg by mouth as needed for allergies.     LORazepam (ATIVAN) 0.5 MG tablet Take 1 tablet (0.5 mg total) by mouth every 8 (eight) hours as needed for anxiety (or nausea). 60 tablet 0   Multiple Vitamins-Minerals (MULTIVITAMIN WITH MINERALS) tablet Take 1 tablet by mouth daily.     Multiple Vitamins-Minerals (ZINC PO) Take 1 tablet by mouth daily.     POTASSIUM CHLORIDE PO Take 2 tablets by mouth daily.     Thiamine HCl (VITAMIN B-1) 250 MG tablet Take 250 mg by mouth daily.     No facility-administered medications prior to visit.    Review of Systems  Constitutional:  Negative for chills,  fever, malaise/fatigue and weight loss.  HENT:  Negative for hearing loss, sore throat and tinnitus.   Eyes:  Negative for blurred vision and double vision.  Respiratory:  Positive for cough and shortness of breath. Negative for hemoptysis, sputum production, wheezing and stridor.   Cardiovascular:  Negative for chest pain, palpitations, orthopnea, leg swelling and PND.  Gastrointestinal:  Negative for abdominal pain, constipation, diarrhea, heartburn, nausea and vomiting.  Genitourinary:  Negative for dysuria, hematuria and urgency.  Musculoskeletal:  Negative for joint pain and myalgias.  Skin:  Negative for itching and rash.  Neurological:  Negative for dizziness, tingling, weakness and headaches.  Endo/Heme/Allergies:  Negative for environmental allergies. Does not bruise/bleed easily.  Psychiatric/Behavioral:  Negative for depression. The patient is not nervous/anxious and does not have insomnia.   All other systems reviewed and are negative.   Objective:  Physical Exam Vitals reviewed.  Constitutional:      General: She is not in acute distress.    Appearance: She is well-developed.  HENT:     Head: Normocephalic and atraumatic.  Eyes:     General: No scleral icterus.    Conjunctiva/sclera: Conjunctivae normal.     Pupils: Pupils are equal, round, and reactive to light.  Neck:     Vascular: No JVD.     Trachea: No tracheal deviation.  Cardiovascular:     Rate and Rhythm: Normal rate and regular rhythm.     Heart sounds: Normal heart sounds. No murmur heard. Pulmonary:     Effort: Pulmonary effort is normal. No tachypnea, accessory muscle usage or respiratory distress.     Breath sounds: No stridor. No wheezing, rhonchi or rales.     Comments: Diminished breath sounds bilaterally Abdominal:     General: Bowel sounds are normal. There is no distension.     Palpations: Abdomen is soft.     Tenderness: There is no abdominal tenderness.  Musculoskeletal:        General: No  tenderness.     Cervical  back: Neck supple.  Lymphadenopathy:     Cervical: No cervical adenopathy.  Skin:    General: Skin is warm and dry.     Capillary Refill: Capillary refill takes less than 2 seconds.     Findings: No rash.  Neurological:     Mental Status: She is alert and oriented to person, place, and time.  Psychiatric:        Behavior: Behavior normal.     Vitals:   12/01/20 0936  BP: 98/60  SpO2: 91%  Weight: 161 lb 9.6 oz (73.3 kg)  Height: 5' 5.5" (1.664 m)   91% on 2 L BMI Readings from Last 3 Encounters:  12/01/20 26.48 kg/m  11/16/20 26.79 kg/m  10/18/20 26.79 kg/m   Wt Readings from Last 3 Encounters:  12/01/20 161 lb 9.6 oz (73.3 kg)  11/16/20 161 lb (73 kg)  10/18/20 161 lb (73 kg)     CBC    Component Value Date/Time   WBC 5.0 11/16/2020 1127   WBC 5.4 07/05/2020 1005   RBC 3.94 11/16/2020 1127   HGB 15.6 (H) 11/16/2020 1127   HGB 13.5 01/02/2013 1510   HCT 47.1 (H) 11/16/2020 1127   HCT 39.1 01/02/2013 1510   PLT 178 11/16/2020 1127   PLT 240 01/02/2013 1510   MCV 119.5 (H) 11/16/2020 1127   MCV 105.7 (H) 01/02/2013 1510   MCH 39.6 (H) 11/16/2020 1127   MCHC 33.1 11/16/2020 1127   RDW 17.0 (H) 11/16/2020 1127   RDW 13.6 01/02/2013 1510   LYMPHSABS 0.7 11/16/2020 1127   LYMPHSABS 1.6 01/02/2013 1510   MONOABS 0.7 11/16/2020 1127   MONOABS 0.7 01/02/2013 1510   EOSABS 0.1 11/16/2020 1127   EOSABS 0.2 01/02/2013 1510   BASOSABS 0.1 11/16/2020 1127   BASOSABS 0.0 01/02/2013 1510     Chest Imaging: 11/12/2020 CT chest: Enlarging soft tissue density, right upper lobe medial adjacent to the mediastinum measuring 2.7 x 2.1 cm.  Additionally has a right lower lobe lesion as well as a left lower lobe lesion of which are smaller but still concerning for malignancy. The patient's images have been independently reviewed by me.    Pulmonary Functions Testing Results: PFT Results Latest Ref Rng & Units 06/30/2016  FVC-Pre L 2.03   FVC-Predicted Pre % 56  FVC-Post L 2.57  FVC-Predicted Post % 71  Pre FEV1/FVC % % 73  Post FEV1/FCV % % 73  FEV1-Pre L 1.48  FEV1-Predicted Pre % 52  FEV1-Post L 1.88  DLCO uncorrected ml/min/mmHg 17.68  DLCO UNC% % 69  DLVA Predicted % 103  TLC L 5.14  TLC % Predicted % 98  RV % Predicted % 152    FeNO:   Pathology:   Echocardiogram:   Heart Catheterization:     Assessment & Plan:     ICD-10-CM   1. Lung nodules  R91.8 Ambulatory referral to Pulmonology    Procedural/ Surgical Case Request: VIDEO BRONCHOSCOPY WITH ENDOBRONCHIAL NAVIGATION    ECHOCARDIOGRAM COMPLETE    Ambulatory Referral for DME    Budeson-Glycopyrrol-Formoterol (BREZTRI AEROSPHERE) 160-9-4.8 MCG/ACT AERO    2. SOB (shortness of breath)  R06.02 ECHOCARDIOGRAM COMPLETE    Ambulatory Referral for DME    Budeson-Glycopyrrol-Formoterol (BREZTRI AEROSPHERE) 160-9-4.8 MCG/ACT AERO    3. Hypoxemia  R09.02     4. Pleural effusion  J90     5. History of anal cancer  Z85.048     6. History of breast cancer  Z85.3  7. History of lung cancer  Z85.118       Discussion:  This is a 60-year-old female with a very complex medical history.  Multiple malignancies in the past.  She has a abnormal CT image with 3 separate areas of concern.  She has a right upper lobe with large medial lesion.  She has a right lower lobe enlarging subsolid lesion as well as a subsolid lesion within the left lower lobe.  All of which could represent recurrence of malignancy due to her history of lung cancer or recurrence of either her other malignancies breast or anal cancer.  Also today in the office she is hypoxic needing oxygen.  We placed her on 2 L nasal cannula.  O2 sats on presentation were in the low 80s.  She also has a small right-sided pleural effusion.  Plan: New orders and DME supply for oxygen needs. 2 L nasal cannula continuous Echocardiogram for evaluation of her cardiac function after receiving chemotherapy.   Also has a small right-sided effusion.  We could be dealing with a cardiomyopathy that is causing her to be hypoxemic and I would like to rule this out before considerations for general anesthesia and biopsy of the abnormal lesions defined above. We discussed the risk benefits and alternatives of general anesthesia and undergoing robotic assisted bronchoscopy. I think all 3 locations are accessible for tissue sampling. She is agreeable to this. We will get a echocardiogram first.  Additionally due to her history of smoking.  She may very well have underlying COPD not sure of this at this time.  We will give her a trial of bronchodilators. Breztri samples given today.  Tentative date for bronchoscopy on 12/28/2020.  I discussed case with Dr. Sherrill her primary oncologist.   Current Outpatient Medications:    albuterol (VENTOLIN HFA) 108 (90 Base) MCG/ACT inhaler, TAKE 2 PUFFS BY MOUTH EVERY 6 HOURS AS NEEDED FOR WHEEZE OR SHORTNESS OF BREATH, Disp: 18 each, Rfl: 2   CALCIUM PO, Take 1 tablet by mouth daily., Disp: , Rfl:    Cyanocobalamin (VITAMIN B-12 PO), Take 2 tablets by mouth daily. Gummies-does not know dose, Disp: , Rfl:    folic acid (FOLVITE) 1 MG tablet, TAKE 1 TABLET BY MOUTH EVERY DAY, Disp: 90 tablet, Rfl: 1   loratadine (CLARITIN) 10 MG tablet, Take 10 mg by mouth as needed for allergies., Disp: , Rfl:    LORazepam (ATIVAN) 0.5 MG tablet, Take 1 tablet (0.5 mg total) by mouth every 8 (eight) hours as needed for anxiety (or nausea)., Disp: 60 tablet, Rfl: 0   Multiple Vitamins-Minerals (MULTIVITAMIN WITH MINERALS) tablet, Take 1 tablet by mouth daily., Disp: , Rfl:    Multiple Vitamins-Minerals (ZINC PO), Take 1 tablet by mouth daily., Disp: , Rfl:    POTASSIUM CHLORIDE PO, Take 2 tablets by mouth daily., Disp: , Rfl:    Thiamine HCl (VITAMIN B-1) 250 MG tablet, Take 250 mg by mouth daily., Disp: , Rfl:   I spent 65 minutes dedicated to the care of this patient on the date of  this encounter to include pre-visit review of records, face-to-face time with the patient discussing conditions above, post visit ordering of testing, clinical documentation with the electronic health record, making appropriate referrals as documented, and communicating necessary findings to members of the patients care team.   Judithann Villamar L Erubiel Manasco, DO [facility] 12/01/2020 10:15 AM   

## 2020-12-02 ENCOUNTER — Other Ambulatory Visit (HOSPITAL_COMMUNITY): Payer: Self-pay

## 2020-12-02 DIAGNOSIS — R0602 Shortness of breath: Secondary | ICD-10-CM | POA: Diagnosis not present

## 2020-12-03 ENCOUNTER — Other Ambulatory Visit: Payer: Self-pay | Admitting: Oncology

## 2020-12-03 ENCOUNTER — Encounter: Payer: Self-pay | Admitting: Oncology

## 2020-12-03 ENCOUNTER — Other Ambulatory Visit (HOSPITAL_COMMUNITY): Payer: Self-pay

## 2020-12-03 NOTE — Telephone Encounter (Signed)
Oral Oncology Patient Advocate Encounter  Called patient to speak to her about registering for a copay card for Lumakras to see if it would reduce her copay.  She expected a high copay with her insurance.  She states that after her appointment she has had some time to think and has questions about treatment and the medication.  I told her I was not able to answer those questions but would relay her concerns to Dr Benay Spice.  [place] Patient [place] Phone [phone] Fax [phone] 12/03/2020 4:33 PM

## 2020-12-06 ENCOUNTER — Encounter: Payer: Self-pay | Admitting: Nurse Practitioner

## 2020-12-06 ENCOUNTER — Other Ambulatory Visit: Payer: Self-pay

## 2020-12-06 ENCOUNTER — Inpatient Hospital Stay: Payer: BC Managed Care – PPO | Attending: Oncology | Admitting: Nurse Practitioner

## 2020-12-06 VITALS — BP 133/81 | HR 90 | Temp 97.8°F | Resp 18 | Ht 65.0 in | Wt 159.2 lb

## 2020-12-06 DIAGNOSIS — C3411 Malignant neoplasm of upper lobe, right bronchus or lung: Secondary | ICD-10-CM | POA: Diagnosis not present

## 2020-12-06 DIAGNOSIS — D649 Anemia, unspecified: Secondary | ICD-10-CM | POA: Diagnosis not present

## 2020-12-06 DIAGNOSIS — Z923 Personal history of irradiation: Secondary | ICD-10-CM | POA: Insufficient documentation

## 2020-12-06 DIAGNOSIS — Z853 Personal history of malignant neoplasm of breast: Secondary | ICD-10-CM | POA: Diagnosis not present

## 2020-12-06 DIAGNOSIS — C3412 Malignant neoplasm of upper lobe, left bronchus or lung: Secondary | ICD-10-CM | POA: Diagnosis not present

## 2020-12-06 DIAGNOSIS — C3431 Malignant neoplasm of lower lobe, right bronchus or lung: Secondary | ICD-10-CM | POA: Diagnosis not present

## 2020-12-06 DIAGNOSIS — Z9221 Personal history of antineoplastic chemotherapy: Secondary | ICD-10-CM | POA: Insufficient documentation

## 2020-12-06 DIAGNOSIS — Z170 Estrogen receptor positive status [ER+]: Secondary | ICD-10-CM | POA: Insufficient documentation

## 2020-12-06 NOTE — Progress Notes (Signed)
[place] OFFICE PROGRESS NOTE   Diagnosis: Non-small cell lung cancer  INTERVAL HISTORY:   [name] returns prior to scheduled follow-up for additional discussion prior to beginning sotorasib.  She reports the first month of sotorasib will cost her $10,000, subsequent monthly cost $6000-$7000.  She is concerned about the side effect profile of sotorasib.  Dyspnea is better since beginning the inhaler prescribed by Dr. Valeta Harms.  No nausea or vomiting.  Bowels are moving.  Objective:  Vital signs in last 24 hours:  Blood pressure 133/81, pulse 90, temperature 97.8 F (36.6 C), resp. rate 18, height _0  (1.651 m), weight 159 lb 3.2 oz (72.2 kg), last menstrual period 04/07/2008, SpO2 91 %.    Resp: Lungs clear bilaterally. Cardio: Regular rate and rhythm. GI: Abdomen soft and nontender.  No hepatosplenomegaly. Vascular: Trace edema left lower leg.    Lab Results:  Lab Results  Component Value Date   WBC 5.0 11/16/2020   HGB 15.6 (H) 11/16/2020   HCT 47.1 (H) 11/16/2020   MCV 119.5 (H) 11/16/2020   PLT 178 11/16/2020   NEUTROABS 3.4 11/16/2020    Imaging:  No results found.  Medications: I have reviewed the patient's current medications.  Assessment/Plan: Anal cancer-left anal margin/anal canal mass, status post a biopsy on 08/09/2012 confirming invasive moderately differentiated squamous cell carcinoma,p16 positive.   -Staging PET scan 08/20/2012-hypermetabolic anal mass, no hypermetabolic metastases   -Initiation of concurrent radiation with cycle 1 of mitomycin C./5-fluorouracil 09/02/2012.   -She completed cycle 2 5-fluorouracil/mitomycin C. beginning 10/01/2012.   -She completed radiation 10/10/2012.   2. Right breast cancer 2009, ER positive, PR positive, HER-2 positive. She is status post neoadjuvant AC x4 cycles followed by Taxol/Herceptin. She underwent a right lumpectomy and sentinel lymph node biopsy 09/22/2008 with no evidence of residual  invasive carcinoma and 5 negative sentinel lymph nodes. She then completed right breast radiation. She began tamoxifen 12/15/2008 and completed adjuvant Herceptin 05/25/2009. The tamoxifen was discontinued and she was switched to Arimidex in July 2013.  Arimidex discontinued in mid July 2019. 3. History of enlargement of the right tonsil.   4. Nonspecific pulmonary nodules without hypermetabolic activity on the PET scan 08/20/2012. Chest CT 10/21/2012 with scattered pulmonary nodules including nodules that were not present in 2010. Annual surveillance was recommended. Follow-up chest CT 10/24/2013 with scattered groundglass densities again noted in both lungs mostly unchanged when compared to the previous study. A sub-solid right lower lobe nodule had increased central density. The size was unchanged.The nodule has been present since 2009. 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 No EGFR, ALK, BRAF, RET, ERBB2, or ROS1 alteration CT chest 11/14/2016-status post left upper lobe wedge resection, stable right lung nodules Chest CT 05/02/2017-stable bilateral solid and groundglass nodules, stable mild mediastinal lymphadenopathy CT chest 12/13/2017- enlargement of right upper lobe nodule, other  lung  nodules and chest lymph node stable PET 01/09/2018- right upper lobe nodule and right lower lobe nodule have enlarged since 2018 and have low level metabolic activity increased size of an 8 mm right upper lobe nodule without increased metabolic activity, stable activity and a mildly enlarged lower paratracheal node, no evidence of metastatic disease in the abdomen or pelvis CT chest 04/22/2018- no new lung nodules, dominant right lung nodules slightly increased in size Bronchoscopy/EBUS 05/04/2019 with biopsy of level 7 node, biopsy of right lower lobe nodule, FNA of right lower lobe nodule-negative CT biopsy of right upper lobe nodule 06/07/2018- adenocarcinoma with lepidic and acinar patterns SBRT to the dominant right upper lobe nodule 07/23/2018-07/30/2018 CT 09/16/2018-slight increase in size of medial right upper lobe nodule-possibly secondary to interval radiation, other nodules are unchanged CT 12/25/2018- medial right upper lobe nodule has decreased in size slightly, multiple additional solid and groundglass nodules are stable CT 06/09/2019-enlarging right lower lobe nodule, enlarging subsolid nodule, new right upper lobe nodule SBRT to right lower lobe lung nodule, 3 fractions 07/08/2019-07/14/2019 CT 12/05/2019-dominant right lower lobe lesion stable to slightly decreased in size, progressive solid component associated with a dominant right upper lung nodule, mild progression of a left upper lobe nodule, new subsolid anterior medial left upper lobe nodule Cycle 1 Alimta/carboplatin/pembrolizumab 02/04/2020 Cycle 2 Alimta/carboplatin/pembrolizumab 02/23/2020 Cycle 3 Alimta/carboplatin/pembrolizumab 03/15/2020 Cycle 4 Alimta/carboplatin/pembrolizumab 04/05/2020 (Alimta and carboplatin dose reduced secondary to anemia) CT chest 04/12/2020-decreased size of solid right lower lobe and right upper lobe nodules, groundglass nodules in the left lung unchanged Cycle 5 Alimta/pembrolizumab 05/03/2020 Cycle 6  Alimta/pembrolizumab 05/21/2020 Cycle 7 Alimta/pembrolizumab 06/14/2020 Cycle 8 Alimta/pembrolizumab 07/05/2020 CT chest 07/22/2020-stable nodules, no evidence of disease progression, unchanged multifocal groundglass nodules suspicious for multifocal adenocarcinoma. Cycle 9 Alimta/Pembrolizumab 07/26/2020 Cycle 10 Alimta/Pembrolizumab 08/16/2020 Cycle 11 Alimta/Pembrolizumab 09/06/2020 Cycle 12 Alimta/pembrolizumab 09/27/2020 Cycle 13 Alimta/pembrolizumab 10/18/2020 CT chest 11/12/2020-enlarging soft tissue density right upper lobe medially adjacent to the mediastinum measuring 2.7 x 2.1 cm, concerning for recurrent tumor.  Stable radiation changes involving the right upper lobe and right lower lobe.  Stable scattered small solid and subsolid pulmonary nodules bilaterally.  New right-sided pleural effusion.  Stable borderline enlarged mediastinal lymph nodes. 5.  Baker's cyst left leg, pain and swelling in the left calf, negative Doppler 01/24/2020 and 01/29/2020-evaluated by orthopedics and underwent aspiration of the cyst 01/29/2000, completed a course of Keflex 6.  Anemia secondary to chemotherapy, status post 2 units of packed red blood cells on 04/10/2020-improved      Disposition: Ms. Irigoyen appears unchanged.  The sotorasib co-pay is cost prohibitive.  Patient assistance programs are being investigated.  She is interested in a second opinion at [facility].  We made a referral to Dr. Durenda Hurt.  She will return for follow-up here in 2 weeks.  We are available to see her sooner if needed.  Patient seen with Dr. Benay Spice.    Ned Card ANP/GNP-BC   12/06/2020  11:27 AM This was a shared visit with Ned Card.  Ms. Lajeunesse has multiple concerns regarding treatment with sotorasib.  These include cost and potential toxicities.  She is also uncertain on the indication for a bronchoscopy.  She does not wish to enroll on the lung map substudy.  She would like a second opinion.  I made referral to Dr. Durenda Hurt.   She will discuss sotorasib and alternate treatment options with Dr. Durenda Hurt.  I was present for greater than 50% of today's visit.  I performed medical decision making.  Julieanne Manson, MD

## 2020-12-16 ENCOUNTER — Encounter: Payer: Self-pay | Admitting: *Deleted

## 2020-12-16 DIAGNOSIS — C3490 Malignant neoplasm of unspecified part of unspecified bronchus or lung: Secondary | ICD-10-CM | POA: Insufficient documentation

## 2020-12-20 ENCOUNTER — Inpatient Hospital Stay: Payer: BC Managed Care – PPO | Admitting: Nurse Practitioner

## 2020-12-20 ENCOUNTER — Ambulatory Visit (INDEPENDENT_AMBULATORY_CARE_PROVIDER_SITE_OTHER): Payer: BC Managed Care – PPO | Admitting: Obstetrics & Gynecology

## 2020-12-20 ENCOUNTER — Other Ambulatory Visit: Payer: Self-pay

## 2020-12-20 ENCOUNTER — Inpatient Hospital Stay: Payer: BC Managed Care – PPO

## 2020-12-20 ENCOUNTER — Encounter (HOSPITAL_BASED_OUTPATIENT_CLINIC_OR_DEPARTMENT_OTHER): Payer: Self-pay | Admitting: Obstetrics & Gynecology

## 2020-12-20 ENCOUNTER — Other Ambulatory Visit (HOSPITAL_COMMUNITY)
Admission: RE | Admit: 2020-12-20 | Discharge: 2020-12-20 | Disposition: A | Discharge status: Home / Self Care | Payer: BC Managed Care – PPO | Source: Ambulatory Visit | Attending: Obstetrics & Gynecology | Admitting: Obstetrics & Gynecology

## 2020-12-20 VITALS — BP 144/78 | HR 81 | Ht 65.0 in | Wt 157.6 lb

## 2020-12-20 DIAGNOSIS — Z124 Encounter for screening for malignant neoplasm of cervix: Secondary | ICD-10-CM

## 2020-12-20 DIAGNOSIS — N72 Inflammatory disease of cervix uteri: Secondary | ICD-10-CM | POA: Diagnosis not present

## 2020-12-20 DIAGNOSIS — C50411 Malignant neoplasm of upper-outer quadrant of right female breast: Secondary | ICD-10-CM

## 2020-12-20 DIAGNOSIS — E2839 Other primary ovarian failure: Secondary | ICD-10-CM | POA: Diagnosis not present

## 2020-12-20 DIAGNOSIS — Z01419 Encounter for gynecological examination (general) (routine) without abnormal findings: Secondary | ICD-10-CM

## 2020-12-20 DIAGNOSIS — C3412 Malignant neoplasm of upper lobe, left bronchus or lung: Secondary | ICD-10-CM

## 2020-12-20 DIAGNOSIS — B977 Papillomavirus as the cause of diseases classified elsewhere: Secondary | ICD-10-CM

## 2020-12-20 DIAGNOSIS — Z170 Estrogen receptor positive status [ER+]: Secondary | ICD-10-CM

## 2020-12-20 DIAGNOSIS — Z1231 Encounter for screening mammogram for malignant neoplasm of breast: Secondary | ICD-10-CM

## 2020-12-20 DIAGNOSIS — C210 Malignant neoplasm of anus, unspecified: Secondary | ICD-10-CM | POA: Diagnosis not present

## 2020-12-20 NOTE — Progress Notes (Signed)
60 y.o. [id number] Married White or Caucasian female here for annual exam.  Denies vaginal bleeding.  No gyn complaints.  Reports lung cancer has some features that make prognosis worse.  Has been to [facility] for [facility] opinion.  Will see Dr. Benay Spice tomorrow to make plan.  Is nervous about this.  Reports will need PET scan done.  Patient's last menstrual period was 04/07/2008.          The current method of family planning is post menopausal status.    Smoker:  no  Health Maintenance: Pap:  12/10/2019 Positive HPV History of abnormal Pap:  history of +HR HPV MMG:  02/16/2020 Negative Colonoscopy:  12/29/2015, follow up 5 years BMD:   2016, normal TDaP:  is not updated per my records Pneumonia vaccine(s):  we discussed this today Shingrix:   declines Hep C testing: 2017   reports that she quit smoking about 7 years ago. Her smoking use included cigarettes. She has never used smokeless tobacco. She reports current alcohol use of about 5.0 - 20.0 standard drinks per week. She reports that she does not use drugs.  Past Medical History:  Diagnosis Date   Anal cancer ([place]) 08/09/2012   Squamous cell   Anxiety    PANIC ATTACKS   Arthritis    Breast cancer ([place]) 2009   ER+PR+HER-2+   Ductal carcinoma ([place]) 02/2008    invasive    Heart palpitations    History of radiation therapy 09/02/12-10/10/12   anal 50.4Gy   History of shingles 10/2014    HPV (human papilloma virus) anogenital infection    vulvar/ freezing hx   Hx of radiation therapy 2020   Lung nodule    Malignant neoplasm of upper lobe of left lung ([place]) 06/2016   Personal history of chemotherapy    Personal history of radiation therapy 2010   Pneumonia    NO RECENT PROBLEMS   PONV (postoperative nausea and vomiting)    Hypotension   Radiation 10/21/08-12/08/08   Right breast 6240 cGy   Shingles 2017   Status post chemotherapy 02/2008   Taxol/Herceptin    Past Surgical History:  Procedure Laterality Date   BREAST BIOPSY Right  02/04/2008   malignant   BREAST LUMPECTOMY Right 2010   malignant   BREAST LUMPECTOMY WITH AXILLARY LYMPH NODE BIOPSY Right 5/10   Dr. Marlou Starks   CESAREAN SECTION     COLONOSCOPY W/ POLYPECTOMY     EVALUATION UNDER ANESTHESIA WITH ANAL FISTULECTOMY N/A 08/09/2012   Procedure: EXAM UNDER ANESTHESIA AND BIOPSY OF ANAL MASS;  Surgeon: Odis Hollingshead, MD;  Location: [facility];  Service: General;  Laterality: N/A;   KNEE ARTHROSCOPY Left    left knee   LUNG LOBECTOMY Left    PORTACATH PLACEMENT  03/2008   dr. Marlou Starks    REMOVAL PORTACATH  2011   VIDEO ASSISTED THORACOSCOPY (VATS)/WEDGE RESECTION Left 07/05/2016   Procedure: VIDEO ASSISTED THORACOSCOPY (VATS)/WEDGE RESECTION left upper lobe,  lymph node dissection and placement of OnQ catheter;  Surgeon: [name], MD;  Location: [facility];  Service: Thoracic;  Laterality: Left;   VIDEO BRONCHOSCOPY N/A 07/05/2016   Procedure: VIDEO BRONCHOSCOPY;  Surgeon: [name], MD;  Location: [place];  Service: Thoracic;  Laterality: N/A;   VIDEO BRONCHOSCOPY N/A 05/03/2018   Procedure: VIDEO BRONCHOSCOPY;  Surgeon: [name], MD;  Location: [place];  Service: Thoracic;  Laterality: N/A;   VIDEO BRONCHOSCOPY WITH ENDOBRONCHIAL NAVIGATION N/A 05/03/2018   Procedure: VIDEO BRONCHOSCOPY  WITH ENDOBRONCHIAL NAVIGATION WITH BIOPSY;  Surgeon: [name], MD;  Location: [facility] OR;  Service: Thoracic;  Laterality: N/A;   VIDEO BRONCHOSCOPY WITH ENDOBRONCHIAL ULTRASOUND N/A 05/03/2018   Procedure: VIDEO BRONCHOSCOPY WITH ENDOBRONCHIAL ULTRASOUND;  Surgeon: [name], MD;  Location: [facility] OR;  Service: Thoracic;  Laterality: N/A;    Current Outpatient Medications  Medication Sig Dispense Refill   albuterol (VENTOLIN HFA) 108 (90 Base) MCG/ACT inhaler TAKE 2 PUFFS BY MOUTH EVERY 6 HOURS AS NEEDED FOR WHEEZE OR SHORTNESS OF BREATH 18 each 2   Budeson-Glycopyrrol-Formoterol (BREZTRI AEROSPHERE) 160-9-4.8 MCG/ACT AERO Inhale 2 puffs into the lungs in  the morning and at bedtime. 10.7 g 0   CALCIUM PO Take 1 tablet by mouth daily.     Cyanocobalamin (VITAMIN B-12 PO) Take 2 tablets by mouth daily. Gummies-does not know dose     folic acid (FOLVITE) 1 MG tablet TAKE 1 TABLET BY MOUTH EVERY DAY 90 tablet 1   loratadine (CLARITIN) 10 MG tablet Take 10 mg by mouth as needed for allergies.     LORazepam (ATIVAN) 0.5 MG tablet Take 1 tablet (0.5 mg total) by mouth every 8 (eight) hours as needed for anxiety (or nausea). 60 tablet 0   Multiple Vitamins-Minerals (MULTIVITAMIN WITH MINERALS) tablet Take 1 tablet by mouth daily.     POTASSIUM CHLORIDE PO Take 2 tablets by mouth daily.     sotorasib 120 MG TABS Take 960 mg by mouth daily. 240 tablet 0   Thiamine HCl (VITAMIN B-1) 250 MG tablet Take 250 mg by mouth daily.     No current facility-administered medications for this visit.    Family History  Problem Relation Age of Onset   Lung cancer Father    Stroke Mother    Breast cancer Neg Hx     Review of Systems  Constitutional: Negative.   Gastrointestinal: Negative.   Genitourinary: Negative.    Exam:   BP (!) 144/78 (BP Location: Left Arm, Patient Position: Sitting, Cuff Size: Small)   Pulse 81   Ht _0  (1.651 m) Comment: reported  Wt 157 lb 9.6 oz (71.5 kg)   LMP 04/07/2008   BMI 26.23 kg/m   Height: _1  (165.1 cm) (reported)  General appearance: alert, cooperative and appears stated age Head: Normocephalic, without obvious abnormality, atraumatic Neck: no adenopathy, supple, symmetrical, trachea midline and thyroid normal to inspection and palpation Lungs: clear to auscultation bilaterally Breasts: normal appearance, no masses or tenderness, well healed right breast scars Heart: regular rate and rhythm Abdomen: soft, non-tender; bowel sounds normal; no masses,  no organomegaly Extremities: extremities normal, atraumatic, no cyanosis or edema Skin: Skin color, texture, turgor normal. No rashes or lesions Lymph nodes:  Cervical, supraclavicular, and axillary nodes normal. No abnormal inguinal nodes palpated Neurologic: Grossly normal  Pelvic: External genitalia:  no lesions              Urethra:  normal appearing urethra with no masses, tenderness or lesions              Bartholins and Skenes: normal                 Vagina: normal appearing vagina with normal color and no discharge, no lesions              Cervix: no lesions              Pap taken: Yes.   Bimanual Exam:  Uterus:  normal size, contour,  position, consistency, mobility, non-tender              Adnexa: normal adnexa and no mass, fullness, tenderness               Rectovaginal: Confirms               Anus:  stable radiation changes, normal sphincter tone, no lesions  Chaperone, Octaviano Batty, CMA, was present for exam.  Assessment/Plan: 1. Well woman exam with routine gynecological exam - pap and HR HPV obtained today - MMG 02/27/2020 - Colonoscopy 12/2015.  Pt aware this is due this year. - BMD 2016.  D/w pt.   - vaccines reviewed.  Tdap and pneumonia vaccines are not up to date.  She is seeing Dr. Benay Spice tomorrow and advised to ask  2. High risk human papilloma virus (HPV) infection of cervix  3. Hypoestrogenism - BMD ordered to be done with MMG in the fall - DG BONE DENSITY (DXA); Future  4. Anal cancer ([place])  5. Malignant neoplasm of upper-outer quadrant of right breast in female, estrogen receptor positive ([place])  6. Malignant neoplasm of upper lobe of left lung ([place]) - seeing Dr. Benay Spice tomorrow to make plan of care

## 2020-12-21 ENCOUNTER — Telehealth: Payer: Self-pay | Admitting: Pulmonary Disease

## 2020-12-21 ENCOUNTER — Encounter (HOSPITAL_BASED_OUTPATIENT_CLINIC_OR_DEPARTMENT_OTHER): Payer: Self-pay | Admitting: Obstetrics & Gynecology

## 2020-12-21 ENCOUNTER — Inpatient Hospital Stay (HOSPITAL_BASED_OUTPATIENT_CLINIC_OR_DEPARTMENT_OTHER): Payer: BC Managed Care – PPO | Admitting: Oncology

## 2020-12-21 ENCOUNTER — Telehealth: Payer: Self-pay | Admitting: *Deleted

## 2020-12-21 VITALS — BP 133/88 | HR 86 | Temp 97.7°F | Resp 18 | Ht 65.0 in | Wt 155.2 lb

## 2020-12-21 DIAGNOSIS — R918 Other nonspecific abnormal finding of lung field: Secondary | ICD-10-CM

## 2020-12-21 DIAGNOSIS — C3412 Malignant neoplasm of upper lobe, left bronchus or lung: Secondary | ICD-10-CM

## 2020-12-21 DIAGNOSIS — R0602 Shortness of breath: Secondary | ICD-10-CM

## 2020-12-21 DIAGNOSIS — D649 Anemia, unspecified: Secondary | ICD-10-CM | POA: Diagnosis not present

## 2020-12-21 DIAGNOSIS — Z853 Personal history of malignant neoplasm of breast: Secondary | ICD-10-CM | POA: Diagnosis not present

## 2020-12-21 DIAGNOSIS — Z170 Estrogen receptor positive status [ER+]: Secondary | ICD-10-CM | POA: Diagnosis not present

## 2020-12-21 DIAGNOSIS — Z923 Personal history of irradiation: Secondary | ICD-10-CM | POA: Diagnosis not present

## 2020-12-21 DIAGNOSIS — Z9221 Personal history of antineoplastic chemotherapy: Secondary | ICD-10-CM | POA: Diagnosis not present

## 2020-12-21 DIAGNOSIS — C3431 Malignant neoplasm of lower lobe, right bronchus or lung: Secondary | ICD-10-CM | POA: Diagnosis not present

## 2020-12-21 DIAGNOSIS — C3411 Malignant neoplasm of upper lobe, right bronchus or lung: Secondary | ICD-10-CM | POA: Diagnosis not present

## 2020-12-21 MED ORDER — BREZTRI AEROSPHERE 160-9-4.8 MCG/ACT IN AERO: 2.0000 | INHALATION_SPRAY | Freq: Two times a day (BID) | RESPIRATORY_TRACT | 5 refills | Status: DC

## 2020-12-21 NOTE — Telephone Encounter (Signed)
Echo has been rescheduled to 8/19 at 9:15, arrive 9:00 at [facility] location.  I spoke to pt & gave her appt info.  Nothing further needed.

## 2020-12-21 NOTE — Telephone Encounter (Signed)
Called and spoke with Patient.  Patient stated Gina Cervantes worked very good and she can tell a big difference.  Patient requested a Breztri prescription to be sent to [facility]. Requested prescription sent to pharmacy.   Patient stated she is scheduled echo on 01/03/21, but states she needs to to be done before then.  Patient stated she needs echo before 12/28/20 bronchoscopy, if possible.  Patient is will to go to any location.   Message routed to [facility] to assist in echo

## 2020-12-21 NOTE — Telephone Encounter (Signed)
I sent "teams" message to Edmon Crape at [facility] on [address] to see if they have anything sooner and if not can she check the other [place] locations.

## 2020-12-21 NOTE — Telephone Encounter (Signed)
Patient made aware of PET on 8/24 at 0900/0930 at [facility]. NPO except water 6 hours prior. She expressed concern it is after the biopsy on 8/23, but Dr. Benay Spice said that is OK.

## 2020-12-21 NOTE — Progress Notes (Signed)
[place] OFFICE PROGRESS NOTE   Diagnosis: Lung cancer  INTERVAL HISTORY:   Gina Cervantes returns as scheduled.  She reports feeling well at present.  She feels the best she has in a long time.  She saw Dr. Aniceto Boss on 12/16/2020.  He recommends a staging PET scan and bronchoscopy prior to deciding on therapy.  He will recommend radiation if there is a sole site of active disease and sotorasib on the lung MAP trial if she has more diffuse disease. Gina Cervantes is scheduled for bronchoscopy by Dr. Valeta Harms next week.  Objective:  Vital signs in last 24 hours:  Blood pressure 133/88, pulse 86, temperature 97.7 F (36.5 C), temperature source Oral, resp. rate 18, height _0  (1.651 m), weight 155 lb 3.2 oz (70.4 kg), last menstrual period 04/07/2008, SpO2 93 %.    Lymphatics: No cervical or supraclavicular nodes Resp: Lungs clear bilaterally Cardio: Regular rate and rhythm GI: No hepatosplenomegaly Vascular: Trace edema to left lower leg a  Lab Results:  Lab Results  Component Value Date   WBC 5.0 11/16/2020   HGB 15.6 (H) 11/16/2020   HCT 47.1 (H) 11/16/2020   MCV 119.5 (H) 11/16/2020   PLT 178 11/16/2020   NEUTROABS 3.4 11/16/2020    CMP  Lab Results  Component Value Date   NA 136 11/16/2020   K 5.0 11/16/2020   CL 94 (L) 11/16/2020   CO2 35 (H) 11/16/2020   GLUCOSE 103 (H) 11/16/2020   BUN 17 11/16/2020   CREATININE 0.83 11/16/2020   CALCIUM 9.3 11/16/2020   PROT 7.3 11/16/2020   ALBUMIN 3.9 11/16/2020   AST 19 11/16/2020   ALT 16 11/16/2020   ALKPHOS 57 11/16/2020   BILITOT 0.6 11/16/2020   GFRNONAA >60 11/16/2020   GFRAA >60 01/29/2020    Anal cancer-left anal margin/anal canal mass, status post a biopsy on 08/09/2012 confirming invasive moderately differentiated squamous cell carcinoma,p16 positive.   -Staging PET scan 08/20/2012-hypermetabolic anal mass, no hypermetabolic metastases   -Initiation of concurrent radiation with cycle 1 of  mitomycin C./5-fluorouracil 09/02/2012.   -She completed cycle 2 5-fluorouracil/mitomycin C. beginning 10/01/2012.   -She completed radiation 10/10/2012.   2. Right breast cancer 2009, ER positive, PR positive, HER-2 positive. She is status post neoadjuvant AC x4 cycles followed by Taxol/Herceptin. She underwent a right lumpectomy and sentinel lymph node biopsy 09/22/2008 with no evidence of residual invasive carcinoma and 5 negative sentinel lymph nodes. She then completed right breast radiation. She began tamoxifen 12/15/2008 and completed adjuvant Herceptin 05/25/2009. The tamoxifen was discontinued and she was switched to Arimidex in July 2013.  Arimidex discontinued in mid July 2019. 3. History of enlargement of the right tonsil.   4. Nonspecific pulmonary nodules without hypermetabolic activity on the PET scan 08/20/2012. Chest CT 10/21/2012 with scattered pulmonary nodules including nodules that were not present in 2010. Annual surveillance was recommended. Follow-up chest CT 10/24/2013 with scattered groundglass densities again noted in both lungs mostly unchanged when compared to the previous study. A sub-solid right lower lobe nodule had increased central density. The size was unchanged.The nodule has been present since 2009. CT chest 06/13/2016-a sub-solid 9 mm right upper lobe nodule has enlarged compared to 2015, a right lower lobe irregular nodular density appears more well defined, new vague 5 mm groundglass left upper lobe nodule, left lower lobe 10 mm groundglass nodule is more apparent, posterior left upper lobe circumscribed solid nodule measures 11 x 10 mm and is new PET  scan 06/22/2016-malignant range activity involving the 11 mm left upper lobe nodule, no other hypermetabolic nodules, suspicious sub-solid right lower lobe nodule Wedge resection of a hypermetabolic left upper lobe nodule on 07/05/2016-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 No EGFR, ALK, BRAF, RET, ERBB2, or ROS1 alteration CT chest 11/14/2016-status post left upper lobe wedge resection, stable right lung nodules Chest CT 05/02/2017-stable bilateral solid and groundglass nodules, stable mild mediastinal lymphadenopathy CT chest 12/13/2017- enlargement of right upper lobe nodule, other lung nodules and chest lymph node stable PET 01/09/2018- right upper lobe nodule and right lower lobe nodule have enlarged since 2018 and have low level metabolic activity increased size of an 8 mm right upper lobe nodule without increased metabolic activity, stable activity and a mildly enlarged lower paratracheal node, no evidence of metastatic disease in the abdomen or pelvis CT chest 04/22/2018- no new lung nodules, dominant right lung nodules slightly increased in size Bronchoscopy/EBUS 05/04/2019 with biopsy of level 7 node, biopsy of right lower lobe nodule, FNA of right lower lobe nodule-negative CT biopsy of right upper lobe nodule 06/07/2018- adenocarcinoma with lepidic and acinar patterns SBRT to the dominant right upper lobe nodule 07/23/2018-07/30/2018 CT 09/16/2018-slight increase in size of medial right upper lobe nodule-possibly secondary to interval radiation, other nodules are unchanged CT 12/25/2018- medial right upper lobe nodule has decreased in size slightly, multiple additional solid and groundglass nodules are stable CT 06/09/2019-enlarging right lower lobe nodule, enlarging subsolid nodule, new right upper lobe nodule SBRT to right lower lobe lung nodule, 3 fractions 07/08/2019-07/14/2019 CT 12/05/2019-dominant right lower lobe lesion stable to slightly decreased in size, progressive solid component associated with a dominant right upper lung nodule, mild progression of a left upper lobe nodule, new subsolid anterior medial left upper lobe nodule Cycle 1 Alimta/carboplatin/pembrolizumab  02/04/2020 Cycle 2 Alimta/carboplatin/pembrolizumab 02/23/2020 Cycle 3 Alimta/carboplatin/pembrolizumab 03/15/2020 Cycle 4 Alimta/carboplatin/pembrolizumab 04/05/2020 (Alimta and carboplatin dose reduced secondary to anemia) CT chest 04/12/2020-decreased size of solid right lower lobe and right upper lobe nodules, groundglass nodules in the left lung unchanged Cycle 5 Alimta/pembrolizumab 05/03/2020 Cycle 6 Alimta/pembrolizumab 05/21/2020 Cycle 7 Alimta/pembrolizumab 06/14/2020 Cycle 8 Alimta/pembrolizumab 07/05/2020 CT chest 07/22/2020-stable nodules, no evidence of disease progression, unchanged multifocal groundglass nodules suspicious for multifocal adenocarcinoma. Cycle 9 Alimta/Pembrolizumab 07/26/2020 Cycle 10 Alimta/Pembrolizumab 08/16/2020 Cycle 11 Alimta/Pembrolizumab 09/06/2020 Cycle 12 Alimta/pembrolizumab 09/27/2020 Cycle 13 Alimta/pembrolizumab 10/18/2020 CT chest 11/12/2020-enlarging soft tissue density right upper lobe medially adjacent to the mediastinum measuring 2.7 x 2.1 cm, concerning for recurrent tumor.  Stable radiation changes involving the right upper lobe and right lower lobe.  Stable scattered small solid and subsolid pulmonary nodules bilaterally.  New right-sided pleural effusion.  Stable borderline enlarged mediastinal lymph nodes. 5.  Baker's cyst left leg, pain and swelling in the left calf, negative Doppler 01/24/2020 and 01/29/2020-evaluated by orthopedics and underwent aspiration of the cyst 01/29/2000, completed a course of Keflex 6.  Anemia secondary to chemotherapy, status post 2 units of packed red blood cells on 04/10/2020-improved     Medications: I have reviewed the patient's current medications.    Disposition: [name] appears stable.  She has non-small cell lung cancer.  She appears to have multifocal disease in the chest.  Dyspnea has improved significantly since she was placed on a new inhaler by Dr. Valeta Harms.  We discussed treatment options again today.  She  would like to consider radiation or surgical resection if  there appears to be an isolated "active "lesion.  We referred her for a staging PET scan.  She understands she may not be a surgical candidate secondary to multifocal disease, previous lung radiation/surgery, and underlying lung disease.  She reports that she will agree to sotorasib on the lung MAP study if the PET scan suggest multifocal disease.  She is concerned about the cost of sotorasib.  We will confirm the drug is free on study.  She will return for an office visit on 12/31/2020.  She is scheduled for bronchoscopy and we will try to arrange for the PET scan in the interim.  Betsy Coder, MD  12/21/2020  1:28 PM

## 2020-12-23 DIAGNOSIS — R0602 Shortness of breath: Secondary | ICD-10-CM

## 2020-12-24 ENCOUNTER — Other Ambulatory Visit: Payer: Self-pay | Admitting: Pulmonary Disease

## 2020-12-24 ENCOUNTER — Telehealth: Payer: Self-pay

## 2020-12-24 ENCOUNTER — Encounter (HOSPITAL_COMMUNITY): Payer: Self-pay | Admitting: Pulmonary Disease

## 2020-12-24 ENCOUNTER — Ambulatory Visit (HOSPITAL_COMMUNITY): Payer: BC Managed Care – PPO | Attending: Internal Medicine

## 2020-12-24 ENCOUNTER — Other Ambulatory Visit: Payer: Self-pay

## 2020-12-24 DIAGNOSIS — R918 Other nonspecific abnormal finding of lung field: Secondary | ICD-10-CM | POA: Diagnosis not present

## 2020-12-24 DIAGNOSIS — R0602 Shortness of breath: Secondary | ICD-10-CM

## 2020-12-24 LAB — CYTOLOGY - PAP
Comment: NEGATIVE
Comment: NEGATIVE
Diagnosis: NEGATIVE
HPV 16: NEGATIVE
HPV 18 / 45: NEGATIVE
High risk HPV: POSITIVE — AB

## 2020-12-24 LAB — ECHOCARDIOGRAM COMPLETE
Area-P 1/2: 4.57 cm2
S' Lateral: 3.6 cm

## 2020-12-24 LAB — SARS CORONAVIRUS 2 (TAT 6-24 HRS): SARS Coronavirus 2: NEGATIVE

## 2020-12-24 MED ORDER — BREZTRI AEROSPHERE 160-9-4.8 MCG/ACT IN AERO: 2.0000 | INHALATION_SPRAY | Freq: Two times a day (BID) | RESPIRATORY_TRACT | 0 refills | Status: DC

## 2020-12-24 NOTE — Progress Notes (Signed)
DUE TO COVID-19 ONLY ONE VISITOR IS ALLOWED TO COME WITH YOU AND STAY IN THE WAITING ROOM ONLY DURING PRE OP AND PROCEDURE DAY OF SURGERY.   PCP - [facility] Cardiologist - n/a Oncology - Dr Donneta Romberg  CT Chest x-ray - 11/12/20 EKG - n/a Stress Test - n/a ECHO - 12/24/20 Cardiac Cath - n/a  Sleep Study -  n/a CPAP - none  Anesthesia review: Yes  STOP now taking any Aspirin (unless otherwise instructed by your surgeon), Aleve, Naproxen, Ibuprofen, Motrin, Advil, Goody's, BC's, all herbal medications, fish oil, and all vitamins.   Coronavirus Screening Covid test on 12/24/20 was negative.  Do you have any of the following symptoms:  Cough yes/no: No Fever (>100.26F)  yes/no: No Runny nose yes/no: No Sore throat yes/no: No Difficulty breathing/shortness of breath  yes/no: No  Have you traveled in the last 14 days and where? yes/no: No  Patient/Husband AL verbalized understanding of instructions that were given via phone.

## 2020-12-24 NOTE — Telephone Encounter (Signed)
Patient stopped by office to pick up [facility] sample. Sample obtained and given to patient.   Nothing further need at this time.

## 2020-12-27 ENCOUNTER — Encounter (HOSPITAL_COMMUNITY): Payer: Self-pay | Admitting: Pulmonary Disease

## 2020-12-27 NOTE — Progress Notes (Signed)
Anesthesia Chart Review: SAME DAY WORK-UP  Case: [id number] Date/Time: 12/28/20 0730   Procedure: VIDEO BRONCHOSCOPY WITH ENDOBRONCHIAL NAVIGATION (Bilateral) - ION   Anesthesia type: General   Diagnosis: Lung nodules [R91.8]   Pre-op diagnosis: lung nodules   Location: [facility] ROOM 3 / [place] [facility]   Surgeons: Garner Nash, DO       DISCUSSION: Patient is a 60 year old female scheduled for the above procedure.  She has history of right breast cancer in 2009, anal cancer in 2014 and lung cancer diagnosed in 2018.  She has been undergoing treatment for non-small cell lung cancer. Per 12/01/20 Progress Note by Dr. Valeta Harms, "This is a 60 year old female with a very complex medical history.  Multiple malignancies in the past.  She has a abnormal CT image with 3 separate areas of concern.  She has a right upper lobe with large medial lesion.  She has a right lower lobe enlarging subsolid lesion as well as a subsolid lesion within the left lower lobe.  All of which could represent recurrence of malignancy due to her history of lung cancer or recurrence of either her other malignancies breast or anal cancer." He also started her on O2 at 2L/[place] for O2 sat in the low 80's on presentation with small right sided pleural effusion on imaging, and he ordered an echo for dyspnea which was just done on 12/24/20. He wanted to evaluated for cardiomyopathy, particularly given her multiple rounds of chemotherapy. LVEF was 45-50%, global hypokinesis, pericardial effusion without evidence of tamponade. I spoke with Dr. Valeta Harms about the results and also anesthesiologist Renold Don, MD. Despite her multiple cancers, Dr. Valeta Harms said her functional status was reasonable. He will consider future referral to [facility] but based on his most recent evaluation of Ms. Schlossberg, did not feel that she had any concerning cardiac symptoms that would warrant urgent referral prior to procedure needed for tissue diagnosis to guide  management.     History includes former smoker (quit 05/08/13), post-operative N/V, anxiety with panic attacks, breast cancer (s/p right breast lumpectomy 09/22/08), anal cancer (diagnosed 08/09/12, s/p radiation, mitomycin/5-fluorouracile; s/p Taxol/Herceptin-completed Hercpetin 05/25/09, radiation), lung cancer (s/p left VATS, RUL wedge resection, LN dissection 07/05/16; video bronch for biopsy RLL nodule, attempted RUL nodule 05/03/18; RUL CT guided biopsy 05/20/18).  Per my discussion with anesthesiologist Dr. Fransisco Beau, patient to be evaluated on the day of surgery. Definitive anesthesia plan at that time.  For labs and EKG on arrival as indicated.  12/24/2020 presurgical COVID-19 test negative.    VS: LMP 04/07/2008  BP Readings from Last 3 Encounters:  12/21/20 133/88  12/20/20 (!) 144/78  12/06/20 133/81   Pulse Readings from Last 3 Encounters:  12/21/20 86  12/20/20 81  12/06/20 90     PROVIDERS: [place], [facility] Betsy Coder, MD is Baker Pierini, MD is RAD[facility]   LABS: Currently, most recent results include: Lab Results  Component Value Date   WBC 5.0 11/16/2020   HGB 15.6 (H) 11/16/2020   HCT 47.1 (H) 11/16/2020   PLT 178 11/16/2020   GLUCOSE 103 (H) 11/16/2020   CHOL 224 (H) 12/05/2018   TRIG 65 12/05/2018   HDL 87 12/05/2018   LDLCALC 124 (H) 12/05/2018   ALT 16 11/16/2020   AST 19 11/16/2020   NA 136 11/16/2020   K 5.0 11/16/2020   CL 94 (L) 11/16/2020   CREATININE 0.83 11/16/2020   BUN 17 11/16/2020   CO2 35 (H) 11/16/2020  TSH 1.982 09/06/2020   INR 1.04 06/07/2018   HGBA1C 5.8 (H) 12/05/2018     IMAGES: CT Chest 11/12/20: IMPRESSION: 1. Enlarging soft tissue density in the right upper lobe medially adjacent to the mediastinum measuring a maximum of 2.7 x 2.1 cm and worrisome for recurrent tumor. Recommend PET-CT for further evaluation. 2. Stable radiation changes involving the right upper lobe and right lower lobe. 3. Stable  scattered small solid and sub solid pulmonary nodules bilaterally. 4. New right-sided pleural effusion, possibly malignant. 5. Stable borderline enlarged mediastinal lymph nodes. 6. No findings for upper abdominal metastatic disease or osseous metastatic disease. 7. Emphysema and aortic atherosclerosis.   EKG: Last Echo > 56 year old.    CV: Echo 12/24/20: IMPRESSIONS   1. LV apical false tendon (normal variant). Left ventricular ejection  fraction, by estimation, is 45 to 50%. The left ventricle has mildly  decreased function. The left ventricle demonstrates global hypokinesis.  Left ventricular diastolic parameters are  consistent with Grade I diastolic dysfunction (impaired relaxation).   2. Right ventricular systolic function is low normal. The right  ventricular size is normal. There is normal pulmonary artery systolic  pressure. The estimated right ventricular systolic pressure is 34.2 mmHg.   3. The pericardial effusion is circumferential. There is no evidence of  cardiac tamponade.   4. The mitral valve is grossly normal. Trivial mitral valve  regurgitation.   5. The aortic valve is tricuspid. Aortic valve regurgitation is not  visualized.   6. The inferior vena cava is normal in size with greater than 50%  respiratory variability, suggesting right atrial pressure of 3 mmHg.   Comparison(s): 02/17/2009: Could not locate prior echo for comparison.   Past Medical History:  Diagnosis Date   Anal cancer ([place]) 08/09/2012   Squamous cell   Anxiety    PANIC ATTACKS   Arthritis    Breast cancer ([place]) 2009   ER+PR+HER-2+   Ductal carcinoma ([place]) 02/2008   invasive    Heart palpitations    History of radiation therapy 09/02/12-10/10/12   anal 50.4Gy   History of shingles 10/2014   HPV (human papilloma virus) anogenital infection    vulvar/ freezing hx   Hx of radiation therapy 2020   Lung nodule    Malignant neoplasm of upper lobe of left lung ([place]) 06/2016   Personal  history of chemotherapy    Personal history of radiation therapy 2010   Pneumonia    NO RECENT PROBLEMS   PONV (postoperative nausea and vomiting)    Hypotension   Radiation 10/21/08-12/08/08   Right breast 6240 cGy   Shingles 2017   Status post chemotherapy 02/2008   Taxol/Herceptin    Past Surgical History:  Procedure Laterality Date   BREAST BIOPSY Right 02/04/2008   malignant   BREAST LUMPECTOMY Right 2010   malignant   BREAST LUMPECTOMY WITH AXILLARY LYMPH NODE BIOPSY Right 5/10   Dr. Marlou Starks   CESAREAN SECTION     COLONOSCOPY W/ POLYPECTOMY     EVALUATION UNDER ANESTHESIA WITH ANAL FISTULECTOMY N/A 08/09/2012   Procedure: EXAM UNDER ANESTHESIA AND BIOPSY OF ANAL MASS;  Surgeon: Odis Hollingshead, MD;  Location: [facility];  Service: General;  Laterality: N/A;   KNEE ARTHROSCOPY Left    left knee   LUNG LOBECTOMY Left    PORTACATH PLACEMENT  03/2008   dr. Marlou Starks    REMOVAL PORTACATH  2011   VIDEO ASSISTED THORACOSCOPY (VATS)/WEDGE RESECTION Left 07/05/2016   Procedure: VIDEO ASSISTED  THORACOSCOPY (VATS)/WEDGE RESECTION left upper lobe,  lymph node dissection and placement of OnQ catheter;  Surgeon: [name], MD;  Location: [place];  Service: Thoracic;  Laterality: Left;   VIDEO BRONCHOSCOPY N/A 07/05/2016   Procedure: VIDEO BRONCHOSCOPY;  Surgeon: [name], MD;  Location[facility];  Service: Thoracic;  Laterality: N/A;   VIDEO BRONCHOSCOPY N/A 05/03/2018   Procedure: VIDEO BRONCHOSCOPY;  Surgeon: [name], MD;  Location: [place];  Service: Thoracic;  Laterality: N/A;   VIDEO BRONCHOSCOPY WITH ENDOBRONCHIAL NAVIGATION N/A 05/03/2018   Procedure: VIDEO BRONCHOSCOPY WITH ENDOBRONCHIAL NAVIGATION WITH BIOPSY;  Surgeon: [name], MD;  Location: [place];  Service: Thoracic;  Laterality: N/A;   VIDEO BRONCHOSCOPY WITH ENDOBRONCHIAL ULTRASOUND N/A 05/03/2018   Procedure: VIDEO BRONCHOSCOPY WITH ENDOBRONCHIAL ULTRASOUND;  Surgeon: [name], MD;  Location: [place];  Service: Thoracic;  Laterality: N/A;    MEDICATIONS: No current facility-administered medications for this encounter.    albuterol (VENTOLIN HFA) 108 (90 Base) MCG/ACT inhaler   CALCIUM PO   Cyanocobalamin (VITAMIN G-76 PO)   folic acid (FOLVITE) 1 MG tablet   loratadine (CLARITIN) 10 MG tablet   LORazepam (ATIVAN) 0.5 MG tablet   Multiple Vitamins-Minerals (MULTIVITAMIN WITH MINERALS) tablet   POTASSIUM CHLORIDE PO   Thiamine HCl (VITAMIN B-1 PO)   Budeson-Glycopyrrol-Formoterol (BREZTRI AEROSPHERE) 160-9-4.8 MCG/ACT AERO   Budeson-Glycopyrrol-Formoterol (BREZTRI AEROSPHERE) 160-9-4.8 MCG/ACT AERO   sotorasib 120 MG TABS    Myra Gianotti, PA-C Surgical Short Stay/Anesthesiology [facility] Phone [phone] [facility] Phone [phone] 12/27/2020 5:11 PM

## 2020-12-27 NOTE — Anesthesia Preprocedure Evaluation (Addendum)
Anesthesia Evaluation  Patient identified by MRN, date of birth, ID band Patient awake    Reviewed: Allergy & Precautions, NPO status , Patient's Chart, lab work & pertinent test results  History of Anesthesia Complications (+) PONV  Airway Mallampati: II  TM Distance: >3 FB     Dental   Pulmonary pneumonia, former smoker,    breath sounds clear to auscultation       Cardiovascular negative cardio ROS   Rhythm:Regular Rate:Normal     Neuro/Psych    GI/Hepatic negative GI ROS, Neg liver ROS,   Endo/Other  negative endocrine ROS  Renal/GU negative Renal ROS     Musculoskeletal  (+) Arthritis ,   Abdominal   Peds  Hematology   Anesthesia Other Findings   Reproductive/Obstetrics                            Anesthesia Physical Anesthesia Plan  ASA: 3  Anesthesia Plan: General   Post-op Pain Management:    Induction: Intravenous  PONV Risk Score and Plan: 4 or greater and Ondansetron, Dexamethasone and Midazolam  Airway Management Planned: Oral ETT  Additional Equipment:   Intra-op Plan:   Post-operative Plan:   Informed Consent: I have reviewed the patients History and Physical, chart, labs and discussed the procedure including the risks, benefits and alternatives for the proposed anesthesia with the patient or authorized representative who has indicated his/her understanding and acceptance.     Dental advisory given  Plan Discussed with: CRNA and Anesthesiologist  Anesthesia Plan Comments: (PAT note written 12/27/2020 by Myra Gianotti, PA-C. )       Anesthesia Quick Evaluation

## 2020-12-28 ENCOUNTER — Ambulatory Visit (HOSPITAL_COMMUNITY): Payer: BC Managed Care – PPO | Admitting: Vascular Surgery

## 2020-12-28 ENCOUNTER — Encounter (HOSPITAL_COMMUNITY)
Admission: RE | Disposition: A | Discharge status: Home / Self Care | Payer: Self-pay | Source: Home / Self Care | Attending: Pulmonary Disease

## 2020-12-28 ENCOUNTER — Other Ambulatory Visit: Payer: Self-pay

## 2020-12-28 ENCOUNTER — Ambulatory Visit (HOSPITAL_COMMUNITY): Payer: BC Managed Care – PPO

## 2020-12-28 ENCOUNTER — Ambulatory Visit (HOSPITAL_COMMUNITY)
Admission: RE | Admit: 2020-12-28 | Discharge: 2020-12-28 | Disposition: A | Discharge status: Home / Self Care | Payer: BC Managed Care – PPO | Attending: Pulmonary Disease | Admitting: Pulmonary Disease

## 2020-12-28 DIAGNOSIS — Z85048 Personal history of other malignant neoplasm of rectum, rectosigmoid junction, and anus: Secondary | ICD-10-CM | POA: Insufficient documentation

## 2020-12-28 DIAGNOSIS — Z9221 Personal history of antineoplastic chemotherapy: Secondary | ICD-10-CM | POA: Insufficient documentation

## 2020-12-28 DIAGNOSIS — Z801 Family history of malignant neoplasm of trachea, bronchus and lung: Secondary | ICD-10-CM | POA: Diagnosis not present

## 2020-12-28 DIAGNOSIS — C3412 Malignant neoplasm of upper lobe, left bronchus or lung: Secondary | ICD-10-CM | POA: Diagnosis not present

## 2020-12-28 DIAGNOSIS — Z902 Acquired absence of lung [part of]: Secondary | ICD-10-CM | POA: Diagnosis not present

## 2020-12-28 DIAGNOSIS — Z923 Personal history of irradiation: Secondary | ICD-10-CM | POA: Diagnosis not present

## 2020-12-28 DIAGNOSIS — Z9889 Other specified postprocedural states: Secondary | ICD-10-CM | POA: Diagnosis not present

## 2020-12-28 DIAGNOSIS — Z419 Encounter for procedure for purposes other than remedying health state, unspecified: Secondary | ICD-10-CM | POA: Diagnosis not present

## 2020-12-28 DIAGNOSIS — R0602 Shortness of breath: Secondary | ICD-10-CM | POA: Diagnosis not present

## 2020-12-28 DIAGNOSIS — C3431 Malignant neoplasm of lower lobe, right bronchus or lung: Secondary | ICD-10-CM | POA: Diagnosis not present

## 2020-12-28 DIAGNOSIS — Z853 Personal history of malignant neoplasm of breast: Secondary | ICD-10-CM | POA: Insufficient documentation

## 2020-12-28 DIAGNOSIS — Z79899 Other long term (current) drug therapy: Secondary | ICD-10-CM | POA: Insufficient documentation

## 2020-12-28 DIAGNOSIS — Z87891 Personal history of nicotine dependence: Secondary | ICD-10-CM | POA: Diagnosis not present

## 2020-12-28 DIAGNOSIS — F410 Panic disorder [episodic paroxysmal anxiety] without agoraphobia: Secondary | ICD-10-CM | POA: Diagnosis not present

## 2020-12-28 DIAGNOSIS — Z85118 Personal history of other malignant neoplasm of bronchus and lung: Secondary | ICD-10-CM | POA: Insufficient documentation

## 2020-12-28 DIAGNOSIS — J90 Pleural effusion, not elsewhere classified: Secondary | ICD-10-CM | POA: Insufficient documentation

## 2020-12-28 DIAGNOSIS — R918 Other nonspecific abnormal finding of lung field: Secondary | ICD-10-CM

## 2020-12-28 DIAGNOSIS — C3411 Malignant neoplasm of upper lobe, right bronchus or lung: Secondary | ICD-10-CM | POA: Diagnosis not present

## 2020-12-28 HISTORY — PX: BRONCHIAL NEEDLE ASPIRATION BIOPSY: SHX5106

## 2020-12-28 HISTORY — PX: BRONCHIAL BIOPSY: SHX5109

## 2020-12-28 HISTORY — PX: VIDEO BRONCHOSCOPY WITH RADIAL ENDOBRONCHIAL ULTRASOUND: SHX6849

## 2020-12-28 HISTORY — PX: BRONCHIAL BRUSHINGS: SHX5108

## 2020-12-28 HISTORY — PX: BRONCHIAL WASHINGS: SHX5105

## 2020-12-28 HISTORY — PX: VIDEO BRONCHOSCOPY WITH ENDOBRONCHIAL NAVIGATION: SHX6175

## 2020-12-28 HISTORY — PX: ENDOBRONCHIAL ULTRASOUND: SHX5096

## 2020-12-28 SURGERY — VIDEO BRONCHOSCOPY WITH ENDOBRONCHIAL NAVIGATION
Anesthesia: General | Laterality: Bilateral

## 2020-12-28 MED ORDER — ROCURONIUM BROMIDE 100 MG/10ML IV SOLN
INTRAVENOUS | Status: DC | PRN
  Administered 2020-12-28: 50 mg via INTRAVENOUS
  Administered 2020-12-28: 30 mg via INTRAVENOUS
  Administered 2020-12-28: 10 mg via INTRAVENOUS
  Administered 2020-12-28: 20 mg via INTRAVENOUS

## 2020-12-28 MED ORDER — MIDAZOLAM HCL 5 MG/5ML IJ SOLN
INTRAMUSCULAR | Status: DC | PRN
  Administered 2020-12-28: 2 mg via INTRAVENOUS

## 2020-12-28 MED ORDER — FENTANYL CITRATE (PF) 100 MCG/2ML IJ SOLN
INTRAMUSCULAR | Status: DC | PRN
  Administered 2020-12-28 (×2): 50 ug via INTRAVENOUS

## 2020-12-28 MED ORDER — DEXAMETHASONE SODIUM PHOSPHATE 10 MG/ML IJ SOLN
INTRAMUSCULAR | Status: DC | PRN
  Administered 2020-12-28: 5 mg via INTRAVENOUS

## 2020-12-28 MED ORDER — CHLORHEXIDINE GLUCONATE 0.12 % MT SOLN
OROMUCOSAL | Status: AC
  Administered 2020-12-28: 15 mL via OROMUCOSAL
  Filled 2020-12-28: qty 15

## 2020-12-28 MED ORDER — LACTATED RINGERS IV SOLN: INTRAVENOUS | Status: DC

## 2020-12-28 MED ORDER — SUGAMMADEX SODIUM 200 MG/2ML IV SOLN
INTRAVENOUS | Status: DC | PRN
  Administered 2020-12-28: 400 mg via INTRAVENOUS

## 2020-12-28 MED ORDER — FENTANYL CITRATE (PF) 100 MCG/2ML IJ SOLN: 25.0000 ug | INTRAMUSCULAR | Status: DC | PRN

## 2020-12-28 MED ORDER — CHLORHEXIDINE GLUCONATE 0.12 % MT SOLN: 15.0000 mL | OROMUCOSAL | Status: AC

## 2020-12-28 MED ORDER — PROPOFOL 10 MG/ML IV BOLUS
INTRAVENOUS | Status: DC | PRN
  Administered 2020-12-28: 160 mg via INTRAVENOUS

## 2020-12-28 MED ORDER — LIDOCAINE 2% (20 MG/ML) 5 ML SYRINGE
INTRAMUSCULAR | Status: DC | PRN
  Administered 2020-12-28: 60 mg via INTRAVENOUS
  Administered 2020-12-28: 40 mg via INTRAVENOUS

## 2020-12-28 MED ORDER — ONDANSETRON HCL 4 MG/2ML IJ SOLN
INTRAMUSCULAR | Status: DC | PRN
  Administered 2020-12-28: 4 mg via INTRAVENOUS

## 2020-12-28 SURGICAL SUPPLY — 46 items

## 2020-12-28 NOTE — Discharge Instructions (Signed)
Flexible Bronchoscopy, Care After This sheet gives you information about how to care for yourself after your test. Your doctor may also give you more specific instructions. If you have problems or questions, contact your doctor. Follow these instructions at home: Eating and drinking Do not eat or drink anything (not even water) for 2 hours after your test, or until your numbing medicine (local anesthetic) wears off. When your numbness is gone and your cough and gag reflexes have come back, you may: Eat only soft foods. Slowly drink liquids. The day after the test, go back to your normal diet. Driving Do not drive for 24 hours if you were given a medicine to help you relax (sedative). Do not drive or use heavy machinery while taking prescription pain medicine. General instructions  Take over-the-counter and prescription medicines only as told by your doctor. Return to your normal activities as told. Ask what activities are safe for you. Do not use any products that have nicotine or tobacco in them. This includes cigarettes and e-cigarettes. If you need help quitting, ask your doctor. Keep all follow-up visits as told by your doctor. This is important. It is very important if you had a tissue sample (biopsy) taken. Get help right away if: You have shortness of breath that gets worse. You get light-headed. You feel like you are going to pass out (faint). You have chest pain. You cough up: More than a little blood. More blood than before. Summary Do not eat or drink anything (not even water) for 2 hours after your test, or until your numbing medicine wears off. Do not use cigarettes. Do not use e-cigarettes. Get help right away if you have chest pain.  This information is not intended to replace advice given to you by your health care provider. Make sure you discuss any questions you have with your health care provider. Document Released: 02/19/2009 Document Revised: 04/06/2017 Document  Reviewed: 05/12/2016 Elsevier Patient Education  2020 [employer].

## 2020-12-28 NOTE — Anesthesia Postprocedure Evaluation (Signed)
Anesthesia Post Note  Patient: Gina Cervantes  Procedure(s) Performed: VIDEO BRONCHOSCOPY WITH ENDOBRONCHIAL NAVIGATION (Bilateral) BRONCHIAL BIOPSIES BRONCHIAL BRUSHINGS BRONCHIAL NEEDLE ASPIRATION BIOPSIES ENDOBRONCHIAL ULTRASOUND RADIAL ENDOBRONCHIAL ULTRASOUND BRONCHIAL WASHINGS     Patient location during evaluation: PACU Anesthesia Type: General Level of consciousness: awake Pain management: pain level controlled Vital Signs Assessment: post-procedure vital signs reviewed and stable Respiratory status: spontaneous breathing Cardiovascular status: stable Postop Assessment: no apparent nausea or vomiting Anesthetic complications: no   No notable events documented.  Last Vitals:  Vitals:   12/28/20 1030 12/28/20 1045  BP: 115/64 (!) 110/54  Pulse: 76 73  Resp: 16 14  Temp:  36.5 C  SpO2: 92% 92%    Last Pain:  Vitals:   12/28/20 1030  TempSrc:   PainSc: 0-No pain                 Athena Baltz

## 2020-12-28 NOTE — Interval H&P Note (Signed)
History and Physical Interval Note:  12/28/2020 7:12 AM  Gina Cervantes  has presented today for surgery, with the diagnosis of lung nodules.  The various methods of treatment have been discussed with the patient and family. After consideration of risks, benefits and other options for treatment, the patient has consented to  Procedure(s) with comments: [place] (Bilateral) - ION as a surgical intervention.  The patient's history has been reviewed, patient examined, no change in status, stable for surgery.  I have reviewed the patient's chart and labs.  Questions were answered to the patient's satisfaction.     [place]

## 2020-12-28 NOTE — Transfer of Care (Signed)
Immediate Anesthesia Transfer of Care Note  Patient: Gina Cervantes  Procedure(s) Performed: VIDEO BRONCHOSCOPY WITH ENDOBRONCHIAL NAVIGATION (Bilateral) BRONCHIAL BIOPSIES BRONCHIAL BRUSHINGS BRONCHIAL NEEDLE ASPIRATION BIOPSIES ENDOBRONCHIAL ULTRASOUND RADIAL ENDOBRONCHIAL ULTRASOUND BRONCHIAL WASHINGS  Patient Location: PACU  Anesthesia Type:General  Level of Consciousness: awake, alert , oriented and patient cooperative  Airway & Oxygen Therapy: Patient Spontanous Breathing and Patient connected to nasal cannula oxygen  Post-op Assessment: Report given to RN and Post -op Vital signs reviewed and stable  Post vital signs: Reviewed and stable  Last Vitals:  Vitals Value Taken Time  BP 145/63 12/28/20 0930  Temp 37 C 12/28/20 0930  Pulse 87 12/28/20 0936  Resp 13 12/28/20 0936  SpO2 89 % 12/28/20 0936  Vitals shown include unvalidated device data.  Last Pain:  Vitals:   12/28/20 0631  TempSrc:   PainSc: 0-No pain      Patients Stated Pain Goal: 0 (25/63/89 3734)  Complications: No notable events documented.

## 2020-12-28 NOTE — Op Note (Signed)
Video Bronchoscopy with Robotic Assisted Bronchoscopic Navigation   Date of Operation: 12/28/2020   Pre-op Diagnosis: Multiple pulmonary nodules, history of multiple malignancy  Post-op Diagnosis: Multiple pulmonary nodules, history of multiple malignancy  Surgeon: Garner Nash, DO  Assistants: None  Anesthesia: General endotracheal anesthesia  Operation: Flexible video fiberoptic bronchoscopy with robotic assistance and biopsies.  Estimated Blood Loss: Minimal  Complications: None  Indications and History: Gina Cervantes is a 59 y.o. female with history of history of multiple malignancy, anal cancer, breast cancer, lung cancer. The risks, benefits, complications, treatment options and expected outcomes were discussed with the patient.  The possibilities of pneumothorax, pneumonia, reaction to medication, pulmonary aspiration, perforation of a viscus, bleeding, failure to diagnose a condition and creating a complication requiring transfusion or operation were discussed with the patient who freely signed the consent.    Description of Procedure: The patient was seen in the Preoperative Area, was examined and was deemed appropriate to proceed.  The patient was taken to [facility] endoscopy room 3, identified as Gina Cervantes and the procedure verified as Flexible Video Fiberoptic Bronchoscopy.  A Time Out was held and the above information confirmed.   Prior to the date of the procedure a high-resolution CT scan of the chest was performed. Utilizing ION software program a virtual tracheobronchial tree was generated to allow the creation of distinct navigation pathways to the patient's parenchymal abnormalities. After being taken to the operating room general anesthesia was initiated and the patient  was orally intubated. The video fiberoptic bronchoscope was introduced via the endotracheal tube and a general inspection was performed which showed normal right and left lung anatomy, aspiration  of the bilateral mainstems was completed to remove any remaining secretions. Robotic catheter inserted into patient's endotracheal tube.   Target #1 right upper lobe medial lesion: The distinct navigation pathways prepared prior to this procedure were then utilized to navigate to patient's lesion identified on CT scan. The robotic catheter was secured into place and the vision probe was withdrawn.  Lesion location was approximated using fluoroscopy and radial endobronchial ultrasound for peripheral targeting. Under fluoroscopic guidance transbronchial needle brushings, transbronchial needle biopsies, and transbronchial forceps biopsies were performed to be sent for cytology and pathology. A bronchioalveolar lavage was performed in the right upper lobe and sent for cytology.  Target #2 left lower lobe, cystic groundglass subsolid: The distinct navigation pathways prepared prior to this procedure were then utilized to navigate to patient's lesion identified on CT scan. The robotic catheter was secured into place and the vision probe was withdrawn.  Lesion location was approximated using fluoroscopy and radial endobronchial ultrasound for peripheral targeting. Under fluoroscopic guidance transbronchial needle brushings, transbronchial needle biopsies, and transbronchial forceps biopsies were performed to be sent for cytology and pathology. A bronchioalveolar lavage was performed in the left lower lobe and sent for cytology.  Standard therapeutic bronchoscope was reinserted into the patient's airway and aspiration of the bilateral mainstem's was necessary for removal of any remaining blood clots and debris.  Following clearance we completed additional BAL to the right upper lobe for cytology and microbiology.  Prior to the termination of the procedure use the curvilinear endobronchial ultrasound to take a look at the right lower lobe lesion that was originally planned for navigation.  This lesion is in the  lateral wall of the right lower lobe subsegments.  It is visible under curvilinear ultrasound but is on the direct opposite side of a branch of the pulmonary artery.  There was no clear view to the lesion without a transarterial stick.  I think it would be best to hold off sampling this location unless we were absolutely in need of determining what that lesion is due to its high risk location.  At the end of the procedure a general airway inspection was performed and there was no evidence of active bleeding. The bronchoscope was removed.  The patient tolerated the procedure well. There was no significant blood loss and there were no obvious complications. A post-procedural chest x-ray is pending.  Samples Target #1: 1. Transbronchial needle brushings from right upper lobe 2. Transbronchial Wang needle biopsies from right upper lobe 3. Transbronchial forceps biopsies from right upper lobe 4. Bronchoalveolar lavage from right upper lobe  Samples Target #2: 1. Transbronchial needle brushings from left lower lobe 2. Transbronchial forceps biopsies from left lower lobe 3. Bronchoalveolar lavage from left lower lobe  Plans:  The patient will be discharged from the PACU to home when recovered from anesthesia and after chest x-ray is reviewed. We will review the cytology, pathology and microbiology results with the patient when they become available. Outpatient followup will be with Leory Plowman L Donnita Farina DO.   Garner Nash, DO [facility] 12/28/2020 9:35 AM

## 2020-12-28 NOTE — Anesthesia Procedure Notes (Signed)
Procedure Name: Intubation Date/Time: 12/28/2020 7:33 AM Performed by: Reeves Dam, CRNA Pre-anesthesia Checklist: Patient identified, Patient being monitored, Timeout performed, Emergency Drugs available and Suction available Patient Re-evaluated:Patient Re-evaluated prior to induction Oxygen Delivery Method: Circle system utilized Preoxygenation: Pre-oxygenation with 100% oxygen Induction Type: IV induction Ventilation: Mask ventilation without difficulty Laryngoscope Size: 3 and Miller Grade View: Grade I Tube type: Oral Tube size: 8.5 mm Number of attempts: 1 Airway Equipment and Method: Stylet Placement Confirmation: ETT inserted through vocal cords under direct vision, positive ETCO2 and breath sounds checked- equal and bilateral Secured at: 22 cm Tube secured with: Tape Dental Injury: Teeth and Oropharynx as per pre-operative assessment

## 2020-12-29 ENCOUNTER — Telehealth: Payer: Self-pay | Admitting: *Deleted

## 2020-12-29 ENCOUNTER — Encounter (HOSPITAL_COMMUNITY): Payer: Self-pay | Admitting: Pulmonary Disease

## 2020-12-29 ENCOUNTER — Other Ambulatory Visit: Payer: Self-pay

## 2020-12-29 ENCOUNTER — Ambulatory Visit
Admission: RE | Admit: 2020-12-29 | Discharge: 2020-12-29 | Disposition: A | Discharge status: Home / Self Care | Payer: BC Managed Care – PPO | Source: Ambulatory Visit | Attending: Oncology | Admitting: Oncology

## 2020-12-29 DIAGNOSIS — C3412 Malignant neoplasm of upper lobe, left bronchus or lung: Secondary | ICD-10-CM | POA: Diagnosis not present

## 2020-12-29 DIAGNOSIS — J984 Other disorders of lung: Secondary | ICD-10-CM | POA: Diagnosis not present

## 2020-12-29 DIAGNOSIS — Z9889 Other specified postprocedural states: Secondary | ICD-10-CM | POA: Diagnosis not present

## 2020-12-29 DIAGNOSIS — J90 Pleural effusion, not elsewhere classified: Secondary | ICD-10-CM | POA: Diagnosis not present

## 2020-12-29 DIAGNOSIS — Z853 Personal history of malignant neoplasm of breast: Secondary | ICD-10-CM | POA: Diagnosis not present

## 2020-12-29 LAB — GLUCOSE, CAPILLARY: Glucose-Capillary: 119 mg/dL — ABNORMAL HIGH (ref 70–99)

## 2020-12-29 LAB — ACID FAST SMEAR (AFB, MYCOBACTERIA): Acid Fast Smear: NEGATIVE

## 2020-12-29 LAB — CYTOLOGY - NON PAP

## 2020-12-29 MED ORDER — FLUDEOXYGLUCOSE F - 18 (FDG) INJECTION
8.1000 | Freq: Once | INTRAVENOUS | Status: AC | PRN
  Administered 2020-12-29: 8.01 via INTRAVENOUS

## 2020-12-29 NOTE — Telephone Encounter (Signed)
Mychart message from patient that she is unable to come at 12:00 because she needs her husband here with her for visit. Per Dr. Benay Spice, moved appointment back to 1:45 pm. Will f/u on path from 8/24 bronch am of 8/26 and if not back will need to reschedule for Monday (per Dr. Benay Spice).

## 2020-12-29 NOTE — Telephone Encounter (Signed)
Left VM informing patient that Dr. Benay Spice has requested to move her 12/31/20 appointment to 12:00. Requested a return call to confirm.

## 2020-12-30 LAB — CULTURE, BAL-QUANTITATIVE W GRAM STAIN: Culture: NO GROWTH

## 2020-12-30 NOTE — Progress Notes (Signed)
Leigh, I called and spoke with the patient regarding her pathology results.  All locations sampled in the chest on recent bronchoscopy were negative for malignancy.  Also reviewed her PET scan results that were completed yesterday.  She has a follow-up appointment with Dr. Benay Spice planned for tomorrow.  Thanks,  BLI  Garner Nash, DO [facility] 12/30/2020 2:14 PM

## 2020-12-31 ENCOUNTER — Other Ambulatory Visit: Payer: Self-pay

## 2020-12-31 ENCOUNTER — Inpatient Hospital Stay (HOSPITAL_BASED_OUTPATIENT_CLINIC_OR_DEPARTMENT_OTHER): Payer: BC Managed Care – PPO | Admitting: Oncology

## 2020-12-31 VITALS — BP 134/67 | HR 88 | Temp 98.2°F | Resp 18 | Ht 65.0 in | Wt 158.6 lb

## 2020-12-31 DIAGNOSIS — C3411 Malignant neoplasm of upper lobe, right bronchus or lung: Secondary | ICD-10-CM | POA: Diagnosis not present

## 2020-12-31 DIAGNOSIS — Z853 Personal history of malignant neoplasm of breast: Secondary | ICD-10-CM | POA: Diagnosis not present

## 2020-12-31 DIAGNOSIS — D649 Anemia, unspecified: Secondary | ICD-10-CM | POA: Diagnosis not present

## 2020-12-31 DIAGNOSIS — Z9221 Personal history of antineoplastic chemotherapy: Secondary | ICD-10-CM | POA: Diagnosis not present

## 2020-12-31 DIAGNOSIS — Z923 Personal history of irradiation: Secondary | ICD-10-CM | POA: Diagnosis not present

## 2020-12-31 DIAGNOSIS — C3412 Malignant neoplasm of upper lobe, left bronchus or lung: Secondary | ICD-10-CM | POA: Diagnosis not present

## 2020-12-31 DIAGNOSIS — C3431 Malignant neoplasm of lower lobe, right bronchus or lung: Secondary | ICD-10-CM | POA: Diagnosis not present

## 2020-12-31 DIAGNOSIS — Z170 Estrogen receptor positive status [ER+]: Secondary | ICD-10-CM | POA: Diagnosis not present

## 2020-12-31 NOTE — Progress Notes (Signed)
Gina Cervantes OFFICE PROGRESS NOTE   Diagnosis: Non-small cell lung cancer  INTERVAL HISTORY:   Gina Cervantes returns as scheduled.  She feels well.  No complaint.  She underwent a bronchoscopy by Dr. Valeta Cervantes on 12/28/2020.  The right and left lung anatomy appeared normal.  A medial right upper lobe lesion was biopsied.  A left lower lobe subsolid lesion was also biopsied.  The right lower lobe lesion that was originally planned for navigation was not biopsied as it was next to a branch of the pulmonary artery. The pathology from the left upper lobe and right upper lobe biopsies revealed no malignancy.  Rare atypical cells were noted on the right upper lobe brushings, insufficient for a diagnosis of malignancy.  She underwent a restaging PET scan on 12/29/2020.  There is low-level metabolic activity associated with the right suprahilar consolidative process.  This area is smaller.  Low-level metabolic activity in a right paratracheal node with a fatty hilum.  Perihilar groundglass density in the right lower lobe is unchanged.  Additional groundglass densities in the left and right lung have low metabolic activity.  A right pleural effusion has resolved.  No evidence of distant metastatic disease. Objective:  Vital signs in last 24 hours:  Blood pressure 134/67, pulse 88, temperature 98.2 F (36.8 C), temperature source Oral, resp. rate 18, height '5\' 5"'  (1.651 m), weight 158 lb 9.6 oz (71.9 kg), last menstrual period 04/07/2008, SpO2 93 %.   Physical exam not performed today  Lab Results:  Lab Results  Component Value Date   WBC 5.0 11/16/2020   HGB 15.6 (H) 11/16/2020   HCT 47.1 (H) 11/16/2020   MCV 119.5 (H) 11/16/2020   PLT 178 11/16/2020   NEUTROABS 3.4 11/16/2020    CMP  Lab Results  Component Value Date   NA 136 11/16/2020   K 5.0 11/16/2020   CL 94 (L) 11/16/2020   CO2 35 (H) 11/16/2020   GLUCOSE 103 (H) 11/16/2020   BUN 17 11/16/2020   CREATININE 0.83  11/16/2020   CALCIUM 9.3 11/16/2020   PROT 7.3 11/16/2020   ALBUMIN 3.9 11/16/2020   AST 19 11/16/2020   ALT 16 11/16/2020   ALKPHOS 57 11/16/2020   BILITOT 0.6 11/16/2020   GFRNONAA >60 11/16/2020   GFRAA >60 01/29/2020    Lab Results  Component Value Date   CEA 5.5 (H) 08/02/2012    Lab Results  Component Value Date   INR 1.04 06/07/2018   LABPROT 13.5 06/07/2018    Imaging:  NM PET Image Initial (PI) Skull Base To Thigh (F-18 FDG)  Result Date: 12/30/2020 CLINICAL DATA:  Subsequent treatment strategy for non-small cell lung cancer. History of LEFT lung non-small cell lung cancer post resection. Additional history of RIGHT breast cancer. EXAM: NUCLEAR MEDICINE PET SKULL BASE TO THIGH TECHNIQUE: 8.0 mCi F-18 FDG was injected intravenously. Full-ring PET imaging was performed from the skull base to thigh after the radiotracer. CT data was obtained and used for attenuation correction and anatomic localization. Fasting blood glucose: 119 mg/dl COMPARISON:  CT chest 11/12/2020, PET-CT 01/09/2018 FINDINGS: Mediastinal blood pool activity: SUV max 2.3 Liver activity: SUV max NA NECK: No hypermetabolic lymph nodes in the neck. Incidental CT findings: none CHEST: RIGHT suprahilar consolidation measuring 2.2 by 2.0 cm (image 98) compares to 2.7 x 2.1 cm. Visually the lesion may be slightly decreased in volume. This lesion has low metabolic activity SUV max equal 2.5 just above background blood pool activity. Small RIGHT  paratracheal node with a fatty hilum also has low metabolic activity SUV max equal 2.7. Ground-glass density in the LEFT upper lobe (image 725)DGUY not have metabolic activity and not changed from 11/12/2020. Perihilar ground-glass density in the RIGHT lower lobe is unchanged from comparison exam. Interval resolution of RIGHT pleural fluid. Sub solid nodule in the LEFT lower lobe measuring 12 mm (image 113/CT series 5) compares to 11 mm with very low metabolic activity SUV max  equal. Incidental CT findings: none ABDOMEN/PELVIS: No abnormal hypermetabolic activity within the liver, pancreas, adrenal glands, or spleen. No hypermetabolic lymph nodes in the abdomen or pelvis. Incidental CT findings: none SKELETON: No focal hypermetabolic activity to suggest skeletal metastasis. Incidental CT findings: none IMPRESSION: 1. Low metabolic activity of the RIGHT suprahilar consolidative process is favored benign post treatment or infectious inflammation. 2. Additional scattered ground-glass densities in LEFT and RIGHT lung have very low metabolic activity. These warrant attention on routine CT surveillance for lung cancer as low-grade adenocarcinoma can not be excluded. 3. Probable post radiation change in the RIGHT lower lobe. 4. Resolution of RIGHT pleural effusion. 5. No evidence of metastatic disease. Electronically Signed   By: Gina Cervantes M.D.   On: 12/30/2020 10:11   DG CHEST PORT 1 VIEW  Result Date: 12/28/2020 CLINICAL DATA:  60 year old female status post bronchoscopy with biopsies. EXAM: PORTABLE CHEST 1 VIEW COMPARISON:  Chest CT 11/12/2020 and earlier. FINDINGS: Portable AP semi upright view at 1010 hours. Stable lung volumes. Stable cardiac size and mediastinal contours. No pneumothorax. No pleural effusion. Stable architectural distortion and patchy opacity lateral to the right hilum and at the right lung base. No new pulmonary opacity. No acute osseous abnormality identified. Visualized tracheal air column is within normal limits. IMPRESSION: Stable lungs, no pneumothorax or other adverse features status post bronchoscopy. Electronically Signed   By: Gina Cervantes M.D.   On: 12/28/2020 10:36   DG C-ARM BRONCHOSCOPY  Result Date: 12/28/2020 C-ARM BRONCHOSCOPY: Fluoroscopy was utilized by the requesting physician.  No radiographic interpretation.    Medications: I have reviewed the patient's current medications.   Assessment/Plan: Anal cancer-left anal margin/anal canal  mass, status post a biopsy on 08/09/2012 confirming invasive moderately differentiated squamous cell carcinoma,p16 positive.   -Staging PET scan 08/20/2012-hypermetabolic anal mass, no hypermetabolic metastases   -Initiation of concurrent radiation with cycle 1 of mitomycin C./5-fluorouracil 09/02/2012.   -She completed cycle 2 5-fluorouracil/mitomycin C. beginning 10/01/2012.   -She completed radiation 10/10/2012.   2. Right breast cancer 2009, ER positive, PR positive, HER-2 positive. She is status post neoadjuvant AC x4 cycles followed by Taxol/Herceptin. She underwent a right lumpectomy and sentinel lymph node biopsy 09/22/2008 with no evidence of residual invasive carcinoma and 5 negative sentinel lymph nodes. She then completed right breast radiation. She began tamoxifen 12/15/2008 and completed adjuvant Herceptin 05/25/2009. The tamoxifen was discontinued and she was switched to Arimidex in July 2013.  Arimidex discontinued in mid July 2019. 3. History of enlargement of the right tonsil.   4. Nonspecific pulmonary nodules without hypermetabolic activity on the PET scan 08/20/2012. Chest CT 10/21/2012 with scattered pulmonary nodules including nodules that were not present in 2010. Annual surveillance was recommended. Follow-up chest CT 10/24/2013 with scattered groundglass densities again noted in both lungs mostly unchanged when compared to the previous study. A sub-solid right lower lobe nodule had increased central density. The size was unchanged.The nodule has been present since 2009. CT chest 06/13/2016-a sub-solid 9 mm right upper lobe nodule has enlarged  compared to 2015, a right lower lobe irregular nodular density appears more well defined, new vague 5 mm groundglass left upper lobe nodule, left lower lobe 10 mm groundglass nodule is more apparent, posterior left upper lobe circumscribed solid nodule measures 11 x 10 mm and is new PET scan 06/22/2016-malignant range activity involving the 11  mm left upper lobe nodule, no other hypermetabolic nodules, suspicious sub-solid right lower lobe nodule Wedge resection of a hypermetabolic left upper lobe nodule on 07/05/2016-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 No EGFR, ALK, BRAF, RET, ERBB2, or ROS1 alteration CT chest 11/14/2016-status post left upper lobe wedge resection, stable right lung nodules Chest CT 05/02/2017-stable bilateral solid and groundglass nodules, stable mild mediastinal lymphadenopathy CT chest 12/13/2017- enlargement of right upper lobe nodule, other lung nodules and chest lymph node stable PET 01/09/2018- right upper lobe nodule and right lower lobe nodule have enlarged since 2018 and have low level metabolic activity increased size of an 8 mm right upper lobe nodule without increased metabolic activity, stable activity and a mildly enlarged lower paratracheal node, no evidence of metastatic disease in the abdomen or pelvis CT chest 04/22/2018- no new lung nodules, dominant right lung nodules slightly increased in size Bronchoscopy/EBUS 05/04/2019 with biopsy of level 7 node, biopsy of right lower lobe nodule, FNA of right lower lobe nodule-negative CT biopsy of right upper lobe nodule 06/07/2018- adenocarcinoma with lepidic and acinar patterns SBRT to the dominant right upper lobe nodule 07/23/2018-07/30/2018 CT 09/16/2018-slight increase in size of medial right upper lobe nodule-possibly secondary to interval radiation, other nodules are unchanged CT 12/25/2018- medial right upper lobe nodule has decreased in size slightly, multiple additional solid and groundglass nodules are stable CT 06/09/2019-enlarging right lower lobe nodule, enlarging subsolid nodule, new right upper lobe nodule SBRT to right lower lobe lung nodule, 3 fractions 07/08/2019-07/14/2019 CT 12/05/2019-dominant right lower lobe lesion stable  to slightly decreased in size, progressive solid component associated with a dominant right upper lung nodule, mild progression of a left upper lobe nodule, new subsolid anterior medial left upper lobe nodule Cycle 1 Alimta/carboplatin/pembrolizumab 02/04/2020 Cycle 2 Alimta/carboplatin/pembrolizumab 02/23/2020 Cycle 3 Alimta/carboplatin/pembrolizumab 03/15/2020 Cycle 4 Alimta/carboplatin/pembrolizumab 04/05/2020 (Alimta and carboplatin dose reduced secondary to anemia) CT chest 04/12/2020-decreased size of solid right lower lobe and right upper lobe nodules, groundglass nodules in the left lung unchanged Cycle 5 Alimta/pembrolizumab 05/03/2020 Cycle 6 Alimta/pembrolizumab 05/21/2020 Cycle 7 Alimta/pembrolizumab 06/14/2020 Cycle 8 Alimta/pembrolizumab 07/05/2020 CT chest 07/22/2020-stable nodules, no evidence of disease progression, unchanged multifocal groundglass nodules suspicious for multifocal adenocarcinoma. Cycle 9 Alimta/Pembrolizumab 07/26/2020 Cycle 10 Alimta/Pembrolizumab 08/16/2020 Cycle 11 Alimta/Pembrolizumab 09/06/2020 Cycle 12 Alimta/pembrolizumab 09/27/2020 Cycle 13 Alimta/pembrolizumab 10/18/2020 CT chest 11/12/2020-enlarging soft tissue density right upper lobe medially adjacent to the mediastinum measuring 2.7 x 2.1 cm, concerning for recurrent tumor.  Stable radiation changes involving the right upper lobe and right lower lobe.  Stable scattered small solid and subsolid pulmonary nodules bilaterally.  New right-sided pleural effusion.  Stable borderline enlarged mediastinal lymph nodes. Bronchoscopy 12/28/2020-right upper lobe and left lower lobe lesions biopsy-negative pathology PET 06/19/9415-EYC metabolic activity involving the right suprahilar consolidative process-favored benign, measured smaller, additional groundglass densities with very low-level metabolic activity, radiation change in the right lower lobe, resolution of right pleural effusion, no distant metastatic disease 5.  Baker's  cyst left leg, pain and swelling in the left calf, negative Doppler 01/24/2020 and 01/29/2020-evaluated by orthopedics and underwent aspiration  of the cyst 01/29/2000, completed a course of Keflex 6.  Anemia secondary to chemotherapy, status post 2 units of packed red blood cells on 04/10/2020-improved       Disposition: Ms. Cow appears stable.  She appears asymptomatic from lung cancer.  She feels much better since she was started on a new inhaler by Dr. Valeta Cervantes.  We reviewed results of the bronchoscopy pathology and PET scan.  We reviewed the PET images.  There is no clear diagnosis of malignancy on either the PET or pathology, but she has scattered groundglass opacities suspicious for low-grade adenocarcinoma.  We discussed treatment options including observation, resuming treatment with pembrolizumab/Alimta, radiation, and sotorasib.  She is not comfortable with beginning sotorasib.  I do not recommend radiation as this is unlikely to be curative and there is no definitive diagnosis of a malignancy involving a right lung lesion.  I will consult with Dr. Aniceto Boss.  We decided to follow her with observation for now.  The plan is to obtain a repeat chest CT prior to an office visit in October.  Betsy Coder, MD  12/31/2020  2:18 PM

## 2021-01-02 DIAGNOSIS — R0602 Shortness of breath: Secondary | ICD-10-CM | POA: Diagnosis not present

## 2021-01-02 LAB — ANAEROBIC CULTURE W GRAM STAIN

## 2021-01-03 ENCOUNTER — Telehealth: Payer: Self-pay | Admitting: *Deleted

## 2021-01-03 ENCOUNTER — Other Ambulatory Visit (HOSPITAL_COMMUNITY): Payer: BC Managed Care – PPO

## 2021-01-03 DIAGNOSIS — C3412 Malignant neoplasm of upper lobe, left bronchus or lung: Secondary | ICD-10-CM

## 2021-01-03 MED ORDER — LIDOCAINE-PRILOCAINE 2.5-2.5 % EX CREA: 1.0000 "application " | TOPICAL_CREAM | CUTANEOUS | 2 refills | Status: DC | PRN

## 2021-01-03 NOTE — Telephone Encounter (Signed)
Received the following message from patient:   "Hello Dr. Valeta Harms and Wallene Dales. I need to request that a discharge order be sent to [facility]. The oxygen equipment that they delivered has sat untouched in my guestroom since July 28th.  Let me know if I need to do anything further to get this request moving. Thank you!"  Patient was seen on 12/01/20 as a consult and qualified for O2.   Dr. Valeta Harms, please advise. Thanks.

## 2021-01-03 NOTE — Telephone Encounter (Signed)
Scheduled port at [facility] for 01/12/21 at 1230 arrival in admitting. Will need to be NPO after 0800 and needs driver. She agrees to procedure date/prep. Would like to begin chemo week of 01/17/21 (M-F) after 11 am. Scheduling message sent and MD aware to enter orders.

## 2021-01-03 NOTE — Telephone Encounter (Addendum)
Notified patient that Dr. Benay Spice and Dr. Durenda Hurt discussed her case. Dr. Durenda Hurt recommends she continue the Alimta/Keytruda. Patient agrees, but wishes to wait till after her CT scan in October. Would really like another 2 months off. She is also requesting a port when treatment resumes. Dr. Benay Spice strongly encourages her to start chemo now instead of waiting and she defers to his judgement. Will schedule PAC on M-W, her choice.

## 2021-01-04 MED ORDER — BREZTRI AEROSPHERE 160-9-4.8 MCG/ACT IN AERO: 2.0000 | INHALATION_SPRAY | Freq: Two times a day (BID) | RESPIRATORY_TRACT | 0 refills | Status: DC

## 2021-01-06 ENCOUNTER — Encounter: Payer: Self-pay | Admitting: *Deleted

## 2021-01-06 NOTE — Progress Notes (Signed)
PA for Lidocaine/Prilocaine 2.5% cream was denied by [employer]. Called #[phone] and was directed to pharmacist. Her plan requires patient to fail OTC Lidocaine before it will consider the prescription.  Will make patient aware of decision when she is here for 1st treatment on 9/14, since it will be too early for the EMLA cream w/this treatment.

## 2021-01-07 NOTE — Telephone Encounter (Signed)
Dr. Valeta Harms please advise on the following My Chart message and attachments: I promise I am a relatively educated person but for the life of me, I can't figure out exactly what I need to get to you. I have read through the formulary provided by [facility] several times. What I expected to find was my RX, Bre[facility], with a list of alternative medications that Dr. Valeta Harms could consider. Nope!   I found the 2 areas, out of 285 pages, where this Rx is noted; I've attached a screen shot of those pages here. Please let me know if this is actually all you need or if I need to get back in touch with [employer] for additional info.   Thank you so much for your patience!  Attachments  [email or web address]  [email or web address]

## 2021-01-11 ENCOUNTER — Other Ambulatory Visit (HOSPITAL_COMMUNITY): Payer: Self-pay

## 2021-01-11 ENCOUNTER — Other Ambulatory Visit: Payer: Self-pay | Admitting: Radiology

## 2021-01-11 NOTE — Telephone Encounter (Signed)
Test claim for alternative Trellegy revealed a copay of around $300. Considering this (as well as the patient's monthly copay when compared to the actual cash price of the medication), Gina Cervantes appears to be the preferred formulary product on the pt's insurance.   Spoke with patient regarding the Gina Cervantes Pay copay card program, which is available to commercially insured patients. If determined to be eligible then patient should be able to receive medication for $0.   Patient states that she has had time to think it over and that the $110 copay is worth the overall relief she experiences with Gina Cervantes, and will continue treatment even if she is denied from this program. Advised that patient reach back out to clinic and ask to speak with the pharmacy team should she have any additional questions or concerns.  Please be aware that Rx Rheum/Pulm pool is generally for specialty medications only at this time, and that we may be unable to provide assistance for similar non-specialty requests in the future. However, our department is currently working on obtaining additional patient advocates that will be used specifically to assist in inhaler/non-specialty medications. Apologies for any inconvenience this may cause in the interim.

## 2021-01-12 ENCOUNTER — Ambulatory Visit (HOSPITAL_COMMUNITY)
Admission: RE | Admit: 2021-01-12 | Discharge: 2021-01-12 | Disposition: A | Discharge status: Home / Self Care | Payer: BC Managed Care – PPO | Source: Ambulatory Visit | Attending: Oncology | Admitting: Oncology

## 2021-01-12 ENCOUNTER — Other Ambulatory Visit: Payer: Self-pay

## 2021-01-12 ENCOUNTER — Telehealth: Payer: Self-pay | Admitting: Medical Oncology

## 2021-01-12 ENCOUNTER — Encounter (HOSPITAL_COMMUNITY): Payer: Self-pay

## 2021-01-12 ENCOUNTER — Other Ambulatory Visit: Payer: Self-pay | Admitting: Oncology

## 2021-01-12 DIAGNOSIS — C3412 Malignant neoplasm of upper lobe, left bronchus or lung: Secondary | ICD-10-CM | POA: Diagnosis not present

## 2021-01-12 DIAGNOSIS — Z79899 Other long term (current) drug therapy: Secondary | ICD-10-CM | POA: Insufficient documentation

## 2021-01-12 DIAGNOSIS — D490 Neoplasm of unspecified behavior of digestive system: Secondary | ICD-10-CM

## 2021-01-12 DIAGNOSIS — Z452 Encounter for adjustment and management of vascular access device: Secondary | ICD-10-CM | POA: Diagnosis not present

## 2021-01-12 DIAGNOSIS — Z87891 Personal history of nicotine dependence: Secondary | ICD-10-CM | POA: Diagnosis not present

## 2021-01-12 DIAGNOSIS — C3490 Malignant neoplasm of unspecified part of unspecified bronchus or lung: Secondary | ICD-10-CM | POA: Diagnosis not present

## 2021-01-12 HISTORY — PX: IR IMAGING GUIDED PORT INSERTION: IMG5740

## 2021-01-12 MED ORDER — LIDOCAINE-EPINEPHRINE 1 %-1:100000 IJ SOLN
INTRAMUSCULAR | Status: AC | PRN
  Administered 2021-01-12: 20 mL

## 2021-01-12 MED ORDER — LIDOCAINE-EPINEPHRINE 1 %-1:100000 IJ SOLN
INTRAMUSCULAR | Status: AC
  Administered 2021-01-12: 20 mL via SUBCUTANEOUS
  Filled 2021-01-12: qty 1

## 2021-01-12 MED ORDER — FENTANYL CITRATE (PF) 100 MCG/2ML IJ SOLN
INTRAMUSCULAR | Status: AC
  Filled 2021-01-12: qty 2

## 2021-01-12 MED ORDER — ONDANSETRON HCL 4 MG/2ML IJ SOLN
INTRAMUSCULAR | Status: AC | PRN
Start: 2021-01-12 — End: 2021-01-12
  Administered 2021-01-12: 4 mg via INTRAVENOUS

## 2021-01-12 MED ORDER — ONDANSETRON HCL 4 MG/2ML IJ SOLN
INTRAMUSCULAR | Status: AC
  Filled 2021-01-12: qty 2

## 2021-01-12 MED ORDER — LORAZEPAM 0.5 MG PO TABS: 0.5000 mg | ORAL_TABLET | Freq: Three times a day (TID) | ORAL | 0 refills | Status: DC | PRN

## 2021-01-12 MED ORDER — SODIUM CHLORIDE 0.9 % IV SOLN: INTRAVENOUS | Status: AC

## 2021-01-12 MED ORDER — FENTANYL CITRATE (PF) 100 MCG/2ML IJ SOLN
INTRAMUSCULAR | Status: AC | PRN
  Administered 2021-01-12 (×2): 50 ug via INTRAVENOUS

## 2021-01-12 MED ORDER — HEPARIN SOD (PORK) LOCK FLUSH 100 UNIT/ML IV SOLN
INTRAVENOUS | Status: AC
  Administered 2021-01-12: 5 [IU]
  Filled 2021-01-12: qty 5

## 2021-01-12 MED ORDER — MIDAZOLAM HCL 2 MG/2ML IJ SOLN
INTRAMUSCULAR | Status: AC | PRN
  Administered 2021-01-12 (×4): 1 mg via INTRAVENOUS

## 2021-01-12 MED ORDER — MIDAZOLAM HCL 2 MG/2ML IJ SOLN
INTRAMUSCULAR | Status: AC
  Filled 2021-01-12: qty 4

## 2021-01-12 NOTE — Procedures (Signed)
Interventional Radiology Procedure Note ° °Procedure: Single Lumen Power Port Placement   ° °Access:  Right internal jugular vein ° °Findings: Catheter tip positioned at cavoatrial junction. Port is ready for immediate use.  ° °Complications: None ° °EBL: < 10 mL ° °Recommendations:  °- Ok to shower in 24 hours °- Do not submerge for 7 days °- Routine line care  ° ° °Wiliam Cauthorn, MD ° ° ° °

## 2021-01-12 NOTE — H&P (Signed)
Chief Complaint: Patient was seen in consultation today for port-a-catheter placement  Referring Physician(s): Sherrill,Gary B  Supervising Physician: Ruthann Cancer  Patient Status: [facility] - Out-pt  History of Present Illness: Gina Cervantes is a 60 y.o. female with a medical history significant for multiple malignancies including anal, breast and lung. She recently underwent a bronchoscopy with Dr. Valeta Harms 12/28/20 with right and left lobe biopsies. Pathology from both samples revealed no malignancy but rare atypical cells were noted on the right upper lobe brushings. A restaging PET scan 12/29/20 showed right suprahilar and right paratracheal node low-level metabolic activity along with scattered ground glass opacities suspicious for low-grade adenocarcinoma. Her oncology team is preparing her for chemo/immunotherapy.    Interventional Radiology has been asked to evaluate this patient for an image-guided port-a-catheter placement to facilitate her treatment plans.   Past Medical History:  Diagnosis Date   Anal cancer ([place]) 08/09/2012   Squamous cell   Anxiety    PANIC ATTACKS   Arthritis    Breast cancer ([place]) 2009   ER+PR+HER-2+   Ductal carcinoma ([place]) 02/2008   invasive    Heart palpitations    History of radiation therapy 09/02/12-10/10/12   anal 50.4Gy   History of shingles 10/2014   HPV (human papilloma virus) anogenital infection    vulvar/ freezing hx   Hx of radiation therapy 2020   Lung nodule    Malignant neoplasm of upper lobe of left lung ([place]) 06/2016   Personal history of chemotherapy    Personal history of radiation therapy 2010   Pneumonia    NO RECENT PROBLEMS   PONV (postoperative nausea and vomiting)    Hypotension   Radiation 10/21/08-12/08/08   Right breast 6240 cGy   Shingles 2017   Status post chemotherapy 02/2008   Taxol/Herceptin    Past Surgical History:  Procedure Laterality Date   BREAST BIOPSY Right 02/04/2008   malignant   BREAST  LUMPECTOMY Right 2010   malignant   BREAST LUMPECTOMY WITH AXILLARY LYMPH NODE BIOPSY Right 5/10   Dr. Marlou Starks   BRONCHIAL BIOPSY  12/28/2020   Procedure: BRONCHIAL BIOPSIES;  Surgeon: Garner Nash, DO;  Location: [facility];  Service: Pulmonary;;   BRONCHIAL BRUSHINGS  12/28/2020   Procedure: BRONCHIAL BRUSHINGS;  Surgeon: Garner Nash, DO;  Location: [facility];  Service: Pulmonary;;   BRONCHIAL NEEDLE ASPIRATION BIOPSY  12/28/2020   Procedure: BRONCHIAL NEEDLE ASPIRATION BIOPSIES;  Surgeon: Garner Nash, DO;  Location: [facility];  Service: Pulmonary;;   BRONCHIAL WASHINGS  12/28/2020   Procedure: BRONCHIAL WASHINGS;  Surgeon: Garner Nash, DO;  Location: [facility];  Service: Pulmonary;;   CESAREAN SECTION     COLONOSCOPY W/ POLYPECTOMY     ENDOBRONCHIAL ULTRASOUND  12/28/2020   Procedure: ENDOBRONCHIAL ULTRASOUND;  Surgeon: Garner Nash, DO;  Location: [facility];  Service: Pulmonary;;   EVALUATION UNDER ANESTHESIA WITH ANAL FISTULECTOMY N/A 08/09/2012   Procedure: EXAM UNDER ANESTHESIA AND BIOPSY OF ANAL MASS;  Surgeon: Odis Hollingshead, MD;  Location: [facility];  Service: General;  Laterality: N/A;   KNEE ARTHROSCOPY Left    left knee   LUNG LOBECTOMY Left    PORTACATH PLACEMENT  03/2008   dr. Marlou Starks    REMOVAL PORTACATH  2011   VIDEO ASSISTED THORACOSCOPY (VATS)/WEDGE RESECTION Left 07/05/2016   Procedure: VIDEO ASSISTED THORACOSCOPY (VATS)/WEDGE RESECTION left upper lobe,  lymph node dissection and placement of OnQ catheter;  Surgeon: [name], MD;  Location: [facility];  Service: Thoracic;  Laterality: Left;   VIDEO BRONCHOSCOPY N/A 07/05/2016   Procedure: VIDEO BRONCHOSCOPY;  Surgeon: [name], MD;  Location: [facility];  Service: Thoracic;  Laterality: N/A;   VIDEO BRONCHOSCOPY N/A 05/03/2018   Procedure: VIDEO BRONCHOSCOPY;  Surgeon: [name], MD;  Location: [facility];  Service: Thoracic;  Laterality: N/A;   VIDEO BRONCHOSCOPY WITH ENDOBRONCHIAL  NAVIGATION N/A 05/03/2018   Procedure: VIDEO BRONCHOSCOPY WITH ENDOBRONCHIAL NAVIGATION WITH BIOPSY;  Surgeon: [name], MD;  Location: [facility];  Service: Thoracic;  Laterality: N/A;   VIDEO BRONCHOSCOPY WITH ENDOBRONCHIAL NAVIGATION Bilateral 12/28/2020   Procedure: VIDEO BRONCHOSCOPY WITH ENDOBRONCHIAL NAVIGATION;  Surgeon: Garner Nash, DO;  Location: [facility];  Service: Pulmonary;  Laterality: Bilateral;  ION   VIDEO BRONCHOSCOPY WITH ENDOBRONCHIAL ULTRASOUND N/A 05/03/2018   Procedure: VIDEO BRONCHOSCOPY WITH ENDOBRONCHIAL ULTRASOUND;  Surgeon: [name], MD;  Location: [facility];  Service: Thoracic;  Laterality: N/A;   VIDEO BRONCHOSCOPY WITH RADIAL ENDOBRONCHIAL ULTRASOUND  12/28/2020   Procedure: RADIAL ENDOBRONCHIAL ULTRASOUND;  Surgeon: Garner Nash, DO;  Location: [facility];  Service: Pulmonary;;    Allergies: No known allergies  Medications: Prior to Admission medications   Medication Sig Start Date End Date Taking? Authorizing Provider  albuterol (VENTOLIN HFA) 108 (90 Base) MCG/ACT inhaler TAKE 2 PUFFS BY MOUTH EVERY 6 HOURS AS NEEDED FOR WHEEZE OR SHORTNESS OF BREATH 11/11/20   Ladell Pier, MD  Budeson-Glycopyrrol-Formoterol (BREZTRI AEROSPHERE) 160-9-4.8 MCG/ACT AERO Inhale 2 puffs into the lungs in the morning and at bedtime. 12/21/20   Icard, Octavio Graves, DO  Budeson-Glycopyrrol-Formoterol (BREZTRI AEROSPHERE) 160-9-4.8 MCG/ACT AERO Inhale 2 puffs into the lungs in the morning and at bedtime. 01/04/21   June Leap L, DO  CALCIUM PO Take 1,200 mg by mouth daily. D3    [provider]  Cyanocobalamin (VITAMIN B-12 PO) Take 2,500 mcg by mouth daily. Gummies-does not know dose    [provider]  folic acid (FOLVITE) 1 MG tablet TAKE 1 TABLET BY MOUTH EVERY DAY 08/06/20   Ladell Pier, MD  lidocaine-prilocaine (EMLA) cream Apply 1 application topically as needed. Apply 1/2 tablespoon to port 2 hours prior to stick and cover with  plastic wrap. Begin 14 days after port placed. 01/03/21   Ladell Pier, MD  loratadine (CLARITIN) 10 MG tablet Take 10 mg by mouth as needed for allergies.    [provider]  LORazepam (ATIVAN) 0.5 MG tablet Take 1 tablet (0.5 mg total) by mouth every 8 (eight) hours as needed for anxiety (or nausea). 01/12/21   Ladell Pier, MD  Multiple Vitamins-Minerals (MULTIVITAMIN WITH MINERALS) tablet Take 1 tablet by mouth daily.    [provider]  POTASSIUM CHLORIDE PO Take 550 mg by mouth daily.    [provider]  Thiamine HCl (VITAMIN B-1 PO) Take 100 mg by mouth daily.    [provider]     Family History  Problem Relation Age of Onset   Lung cancer Father    Stroke Mother    Breast cancer Neg Hx     Social History   Socioeconomic History   Marital status: Married    Spouse name: Not on file   Number of children: Not on file   Years of education: Not on file   Highest education level: Not on file  Occupational History    Employer: [employer], [place]    Comment: Realtor  Tobacco Use  Smoking status: Former    Years: 8.00    Types: Cigarettes    Quit date: 2015    Years since quitting: 7.6   Smokeless tobacco: Never   Tobacco comments:    stopped smoking cigarettes Jan. 2018  Vaping Use   Vaping Use: Never used  Substance and Sexual Activity   Alcohol use: Yes    Alcohol/week: 5.0 - 20.0 standard drinks    Types: 5 - 20 Glasses of wine per week    Comment: occasional   Drug use: No   Sexual activity: Yes    Partners: Male    Birth control/protection: Other-see comments, Post-menopausal    Comment: vasectomy  Other Topics Concern   Not on file  Social History Narrative   Married   Lost a son age 82 this year and husband's brother, still going through grieving process.   No family history of breast or ovarian cancer.   Employed as Patent examiner Strain: Not on  file  Food Insecurity: Not on file  Transportation Needs: Not on file  Physical Activity: Not on file  Stress: Not on file  Social Connections: Not on file    Review of Systems: A 12 point ROS discussed and pertinent positives are indicated in the HPI above.  All other systems are negative.  Review of Systems  Constitutional:  Negative for appetite change and fatigue.  Respiratory:  Negative for cough and shortness of breath.   Cardiovascular:  Negative for chest pain and leg swelling.  Gastrointestinal:  Negative for abdominal pain, diarrhea, nausea and vomiting.  Neurological:  Negative for dizziness and headaches.   Vital Signs: BP 140/82   Pulse (!) 104   Temp 98.4 F (36.9 C) (Oral)   Resp 20   LMP 04/07/2008   SpO2 100%   Physical Exam Constitutional:      General: She is not in acute distress.    Appearance: Normal appearance.  HENT:     Mouth/Throat:     Mouth: Mucous membranes are moist.     Pharynx: Oropharynx is clear.  Pulmonary:     Effort: Pulmonary effort is normal.  Abdominal:     Tenderness: There is no abdominal tenderness.  Musculoskeletal:     Right lower leg: No edema.     Left lower leg: No edema.  Skin:    General: Skin is warm and dry.  Neurological:     Mental Status: She is alert and oriented to person, place, and time.    Imaging: NM PET Image Initial (PI) Skull Base To Thigh (F-18 FDG)  Result Date: 12/30/2020 CLINICAL DATA:  Subsequent treatment strategy for non-small cell lung cancer. History of LEFT lung non-small cell lung cancer post resection. Additional history of RIGHT breast cancer. EXAM: NUCLEAR MEDICINE PET SKULL BASE TO THIGH TECHNIQUE: 8.0 mCi F-18 FDG was injected intravenously. Full-ring PET imaging was performed from the skull base to thigh after the radiotracer. CT data was obtained and used for attenuation correction and anatomic localization. Fasting blood glucose: 119 mg/dl COMPARISON:  CT chest 11/12/2020, PET-CT  01/09/2018 FINDINGS: Mediastinal blood pool activity: SUV max 2.3 Liver activity: SUV max NA NECK: No hypermetabolic lymph nodes in the neck. Incidental CT findings: none CHEST: RIGHT suprahilar consolidation measuring 2.2 by 2.0 cm (image 98) compares to 2.7 x 2.1 cm. Visually the lesion may be slightly decreased in volume. This lesion has low metabolic activity SUV max equal 2.5 just  above background blood pool activity. Small RIGHT paratracheal node with a fatty hilum also has low metabolic activity SUV max equal 2.7. Ground-glass density in the LEFT upper lobe (image 093)OIZT not have metabolic activity and not changed from 11/12/2020. Perihilar ground-glass density in the RIGHT lower lobe is unchanged from comparison exam. Interval resolution of RIGHT pleural fluid. Sub solid nodule in the LEFT lower lobe measuring 12 mm (image 113/CT series 5) compares to 11 mm with very low metabolic activity SUV max equal. Incidental CT findings: none ABDOMEN/PELVIS: No abnormal hypermetabolic activity within the liver, pancreas, adrenal glands, or spleen. No hypermetabolic lymph nodes in the abdomen or pelvis. Incidental CT findings: none SKELETON: No focal hypermetabolic activity to suggest skeletal metastasis. Incidental CT findings: none IMPRESSION: 1. Low metabolic activity of the RIGHT suprahilar consolidative process is favored benign post treatment or infectious inflammation. 2. Additional scattered ground-glass densities in LEFT and RIGHT lung have very low metabolic activity. These warrant attention on routine CT surveillance for lung cancer as low-grade adenocarcinoma can not be excluded. 3. Probable post radiation change in the RIGHT lower lobe. 4. Resolution of RIGHT pleural effusion. 5. No evidence of metastatic disease. Electronically Signed   By: Suzy Bouchard M.D.   On: 12/30/2020 10:11   DG CHEST PORT 1 VIEW  Result Date: 12/28/2020 CLINICAL DATA:  60 year old female status post bronchoscopy with  biopsies. EXAM: PORTABLE CHEST 1 VIEW COMPARISON:  Chest CT 11/12/2020 and earlier. FINDINGS: Portable AP semi upright view at 1010 hours. Stable lung volumes. Stable cardiac size and mediastinal contours. No pneumothorax. No pleural effusion. Stable architectural distortion and patchy opacity lateral to the right hilum and at the right lung base. No new pulmonary opacity. No acute osseous abnormality identified. Visualized tracheal air column is within normal limits. IMPRESSION: Stable lungs, no pneumothorax or other adverse features status post bronchoscopy. Electronically Signed   By: Genevie Ann M.D.   On: 12/28/2020 10:36   ECHOCARDIOGRAM COMPLETE  Result Date: 12/24/2020    ECHOCARDIOGRAM REPORT   Patient Name:   SHERLENE RICKEL Raborn Date of Exam: 12/24/2020 Medical Rec #:  [id number]         Height:       65.0 in Accession #:    [id number]        Weight:       155.2 lb Date of Birth:  04-24-1961          BSA:          1.776 m Patient Age:    49 years          BP:           133/78 mmHg Patient Gender: F                 HR:           86 bpm. Exam Location:  [facility] Procedure: 2D Echo, Cardiac Doppler and Color Doppler Indications:    R06.02 SOB  History:        Patient has prior history of Echocardiogram examinations, most                 recent 02/17/2009. H/o Breast cancer, Signs/Symptoms:Shortness                 of Breath and Lung CA; Risk Factors:Former Smoker.  Sonographer:    Coralyn Helling RDCS Referring Phys: [id number] BRADLEY L ICARD IMPRESSIONS  1. LV apical false tendon (normal variant). Left ventricular ejection fraction, by  estimation, is 45 to 50%. The left ventricle has mildly decreased function. The left ventricle demonstrates global hypokinesis. Left ventricular diastolic parameters are consistent with Grade I diastolic dysfunction (impaired relaxation).  2. Right ventricular systolic function is low normal. The right ventricular size is normal. There is normal pulmonary artery systolic  pressure. The estimated right ventricular systolic pressure is 88.3 mmHg.  3. The pericardial effusion is circumferential. There is no evidence of cardiac tamponade.  4. The mitral valve is grossly normal. Trivial mitral valve regurgitation.  5. The aortic valve is tricuspid. Aortic valve regurgitation is not visualized.  6. The inferior vena cava is normal in size with greater than 50% respiratory variability, suggesting right atrial pressure of 3 mmHg. Comparison(s): 02/17/2009: Could not locate prior echo for comparison. FINDINGS  Left Ventricle: LV apical false tendon (normal variant). Left ventricular ejection fraction, by estimation, is 45 to 50%. The left ventricle has mildly decreased function. The left ventricle demonstrates global hypokinesis. The left ventricular internal  cavity size was normal in size. There is no left ventricular hypertrophy. Left ventricular diastolic parameters are consistent with Grade I diastolic dysfunction (impaired relaxation). Indeterminate filling pressures. Right Ventricle: The right ventricular size is normal. No increase in right ventricular wall thickness. Right ventricular systolic function is low normal. There is normal pulmonary artery systolic pressure. The tricuspid regurgitant velocity is 2.38 m/s,  and with an assumed right atrial pressure of 3 mmHg, the estimated right ventricular systolic pressure is 25.4 mmHg. Left Atrium: Left atrial size was normal in size. Right Atrium: Right atrial size was normal in size. Pericardium: Trivial pericardial effusion is present. The pericardial effusion is circumferential. There is no evidence of cardiac tamponade. Mitral Valve: The mitral valve is grossly normal. Trivial mitral valve regurgitation. Tricuspid Valve: The tricuspid valve is grossly normal. Tricuspid valve regurgitation is trivial. Aortic Valve: The aortic valve is tricuspid. Aortic valve regurgitation is not visualized. Pulmonic Valve: The pulmonic valve was  normal in structure. Pulmonic valve regurgitation is not visualized. Aorta: The aortic root and ascending aorta are structurally normal, with no evidence of dilitation. Venous: The inferior vena cava is normal in size with greater than 50% respiratory variability, suggesting right atrial pressure of 3 mmHg. IAS/Shunts: No atrial level shunt detected by color flow Doppler.  LEFT VENTRICLE PLAX 2D LVIDd:         4.90 cm  Diastology LVIDs:         3.60 cm  LV e' medial:    8.81 cm/s LV PW:         0.80 cm  LV E/e' medial:  11.2 LV IVS:        1.00 cm  LV e' lateral:   9.57 cm/s LVOT diam:     1.90 cm  LV E/e' lateral: 10.3 LV SV:         54 LV SV Index:   31 LVOT Area:     2.84 cm  RIGHT VENTRICLE             IVC RV S prime:     10.70 cm/s  IVC diam: 1.10 cm TAPSE (M-mode): 1.9 cm RVSP:           25.7 mmHg LEFT ATRIUM             Index       RIGHT ATRIUM           Index LA diam:        2.80 cm 1.58 cm/m  RA Pressure: 3.00 mmHg LA Vol (A2C):   39.8 ml 22.41 ml/m RA Area:     12.50 cm LA Vol (A4C):   39.5 ml 22.24 ml/m RA Volume:   31.70 ml  17.85 ml/m LA Biplane Vol: 43.8 ml 24.66 ml/m  AORTIC VALVE LVOT Vmax:   96.60 cm/s LVOT Vmean:  61.800 cm/s LVOT VTI:    0.192 m  AORTA Ao Asc diam: 3.00 cm MITRAL VALVE                TRICUSPID VALVE MV Area (PHT): 4.57 cm     TR Peak grad:   22.7 mmHg MV Decel Time: 166 msec     TR Vmax:        238.00 cm/s MV E velocity: 99.00 cm/s   Estimated RAP:  3.00 mmHg MV A velocity: 113.00 cm/s  RVSP:           25.7 mmHg MV E/A ratio:  0.88                             SHUNTS                             Systemic VTI:  0.19 m                             Systemic Diam: 1.90 cm Lyman Bishop MD Electronically signed by Lyman Bishop MD Signature Date/Time: 12/24/2020/12:16:05 PM    Final    DG C-ARM BRONCHOSCOPY  Result Date: 12/28/2020 C-ARM BRONCHOSCOPY: Fluoroscopy was utilized by the requesting physician.  No radiographic interpretation.    Labs:  CBC: Recent Labs     09/06/20 1120 09/27/20 1318 10/18/20 1017 11/16/20 1127  WBC 6.0 5.7 5.0 5.0  HGB 14.4 14.7 14.9 15.6*  HCT 43.7 43.9 45.5 47.1*  PLT 286 304 264 178    COAGS: No results for input(s): INR, APTT in the last 8760 hours.  BMP: Recent Labs    01/29/20 1010 02/23/20 1204 09/06/20 1120 09/27/20 1318 10/18/20 1017 11/16/20 1127  NA 133*   < > 134* 137 136 136  K 4.6   < > 4.2 4.5 4.4 5.0  CL 95*   < > 93* 95* 94* 94*  CO2 30   < > 34* 34* 33* 35*  GLUCOSE 116*   < > 99 98 106* 103*  BUN 21*   < > '17 17 14 17  ' CALCIUM 9.6   < > 9.6 9.7 8.9 9.3  CREATININE 0.72   < > 0.82 1.06* 0.86 0.83  GFRNONAA >60   < > >60 >60 >60 >60  GFRAA >60  --   --   --   --   --    < > = values in this interval not displayed.    LIVER FUNCTION TESTS: Recent Labs    09/06/20 1120 09/27/20 1318 10/18/20 1017 11/16/20 1127  BILITOT 0.5 0.5 0.6 0.6  AST '20 17 21 19  ' ALT '13 9 16 16  ' ALKPHOS 59 55 62 57  PROT 7.5 7.7 7.4 7.3  ALBUMIN 4.2 4.1 3.8 3.9    TUMOR MARKERS: No results for input(s): AFPTM, CEA, CA199, CHROMGRNA in the last 8760 hours.  Assessment and Plan:  Lung cancer: Gina Cervantes. President, 60 year old female, presents today to the [facility] department for  an image-guided port-a-catheter placement.   Risks and benefits of image-guided port-a-catheter placement were discussed with the patient including, but not limited to bleeding, infection, pneumothorax, or fibrin sheath development and need for additional procedures.  All of the patient's questions were answered, patient is agreeable to proceed. She has been NPO.   Consent signed and in chart.  Thank you for this interesting consult.  I greatly enjoyed meeting Gina Cervantes and look forward to participating in their care.  A copy of this report was sent to the requesting provider on this date.  Electronically Signed: Soyla Dryer, AGACNP-BC [phone] 01/12/2021, 1:38 PM   I spent a  total of  30 Minutes   in face to face in clinical consultation, greater than 50% of which was counseling/coordinating care for port-a-catheter placement.

## 2021-01-12 NOTE — Progress Notes (Signed)
Contacted Dr Serafina Royals in regards to pt's BP readings per left arm sitting 80's over 50's; instructed that pt is receiving IV fluids and is asymptomatic with no complaints. Dr Serafina Royals stated pt had received sedation an if pt is asymptomatic ok to precede with discharge.

## 2021-01-12 NOTE — Discharge Instructions (Signed)
Interventional radiology phone numbers °[phone] °After hours [phone] ° ° ° °You have skin glue (dermabond) over your new port. Do not use the lidocaine cream (EMLA cream) over the skin glue until it has healed. The petroleum in the lidocaine cream will dissolve the skin glue resulting in an infection of your new port. Use ice in a zip lock bag for 1-2 minutes over your new port before the cancer center nurses access your port. ° ° °Implanted Port Insertion, Care After °This sheet gives you information about how to care for yourself after your procedure. Your health care provider may also give you more specific instructions. If you have problems or questions, contact your health care provider. °What can I expect after the procedure? °After the procedure, it is common to have: °Discomfort at the port insertion site. °Bruising on the skin over the port. This should improve over 3-4 days. °Follow these instructions at home: °Port care °After your port is placed, you will get a manufacturer's information card. The card has information about your port. Keep this card with you at all times. °Take care of the port as told by your health care provider. Ask your health care provider if you or a family member can get training for taking care of the port at home. A home health care nurse may also take care of the port. °Make sure to remember what type of port you have. °Incision care °Follow instructions from your health care provider about how to take care of your port insertion site. Make sure you: °Wash your hands with soap and water before and after you change your bandage (dressing). If soap and water are not available, use hand sanitizer. °Change your dressing as told by your health care provider. °Leave skin glue in place. These skin closures may need to stay in place for 2 weeks or longer.  °Check your port insertion site every day for signs of infection. Check for: °Redness, swelling, or pain. °Fluid or  blood. °Warmth. °Pus or a bad smell.  °  °  °Activity °Return to your normal activities as told by your health care provider. Ask your health care provider what activities are safe for you. °Do not lift anything that is heavier than 10 lb (4.5 kg), or the limit that you are told, until your health care provider says that it is safe. °General instructions °Take over-the-counter and prescription medicines only as told by your health care provider. °Do not take baths, swim, or use a hot tub until your health care provider approves.You may remove your dressing tomorrow and shower 24 hours after your procedure. °Do not drive for 24 hours if you were given a sedative during your procedure. °Wear a medical alert bracelet in case of an emergency. This will tell any health care providers that you have a port. °Keep all follow-up visits as told by your health care provider. This is important. °Contact a health care provider if: °You cannot flush your port with saline as directed, or you cannot draw blood from the port. °You have a fever or chills. °You have redness, swelling, or pain around your port insertion site. °You have fluid or blood coming from your port insertion site. °Your port insertion site feels warm to the touch. °You have pus or a bad smell coming from the port insertion site. °Get help right away if: °You have chest pain or shortness of breath. °You have bleeding from your port that you cannot control. °Summary °Take care of   the port as told by your health care provider. Keep the manufacturer's information card with you at all times. °Change your dressing as told by your health care provider. °Contact a health care provider if you have a fever or chills or if you have redness, swelling, or pain around your port insertion site. °Keep all follow-up visits as told by your health care provider. °This information is not intended to replace advice given to you by your health care provider. Make sure you discuss any  questions you have with your health care provider. °Document Revised: 11/20/2017 Document Reviewed: 11/20/2017 °Elsevier Patient Education © 2021 Elsevier Inc. ° ° ° °Moderate Conscious Sedation, Adult, Care After °This sheet gives you information about how to care for yourself after your procedure. Your health care provider may also give you more specific instructions. If you have problems or questions, contact your health care provider. °What can I expect after the procedure? °After the procedure, it is common to have: °Sleepiness for several hours. °Impaired judgment for several hours. °Difficulty with balance. °Vomiting if you eat too soon. °Follow these instructions at home: °For the time period you were told by your health care provider: °Rest. °Do not participate in activities where you could fall or become injured. °Do not drive or use machinery. °Do not drink alcohol. °Do not take sleeping pills or medicines that cause drowsiness. °Do not make important decisions or sign legal documents. °Do not take care of children on your own.  °  °  °Eating and drinking °Follow the diet recommended by your health care provider. °Drink enough fluid to keep your urine pale yellow. °If you vomit: °Drink water, juice, or soup when you can drink without vomiting. °Make sure you have little or no nausea before eating solid foods.   °General instructions °Take over-the-counter and prescription medicines only as told by your health care provider. °Have a responsible adult stay with you for the time you are told. It is important to have someone help care for you until you are awake and alert. °Do not smoke. °Keep all follow-up visits as told by your health care provider. This is important. °Contact a health care provider if: °You are still sleepy or having trouble with balance after 24 hours. °You feel light-headed. °You keep feeling nauseous or you keep vomiting. °You develop a rash. °You have a fever. °You have redness or  swelling around the IV site. °Get help right away if: °You have trouble breathing. °You have new-onset confusion at home. °Summary °After the procedure, it is common to feel sleepy, have impaired judgment, or feel nauseous if you eat too soon. °Rest after you get home. Know the things you should not do after the procedure. °Follow the diet recommended by your health care provider and drink enough fluid to keep your urine pale yellow. °Get help right away if you have trouble breathing or new-onset confusion at home. °This information is not intended to replace advice given to you by your health care provider. Make sure you discuss any questions you have with your health care provider. °Document Revised: 08/22/2019 Document Reviewed: 03/20/2019 °Elsevier Patient Education © 2021 Elsevier Inc.  °

## 2021-01-12 NOTE — Telephone Encounter (Signed)
LUNGMAP: A MASTER PROTOCOL TO EVALUATE BIOMARKER-DRIVEN THERAPIES AND IMMUNOTHERAPIES IN PREVIOUSLY-TREATED NON-SMALL CELL LUNG CANCER (Lung-MAP Screening Study)   I was informed by Dr. Benay Spice that patient changed her mind and was interested in this study, but first wished to know whether the study drug, sotorasib, was provided by the study free of charge. Dr. Benay Spice asked me to follow-up with a phone call to patient with information. MD also stated that patient would not be pursuing study at this time, but possibly at a future date.   Outgoing call: 10:55 Call to patient and informed her that the study drug for LungMap, sotorasib, is provided by the study free of charge. Patient gave her verbal understanding and thanked me for the call and information. Patient denied further questions at this time. Patient thanked for her time and encouraged to call me or Dr. Benay Spice with questions or concerns. Maxwell Marion, RN, BSN, [facility] Clinical Research 01/12/2021 1:28 PM

## 2021-01-13 ENCOUNTER — Other Ambulatory Visit: Payer: Self-pay | Admitting: Oncology

## 2021-01-13 NOTE — Progress Notes (Signed)
ON PATHWAY REGIMEN - Non-Small Cell Lung  No Change  Continue With Treatment as Ordered.  Original Decision Date/Time: 12/25/2019 17:02     A cycle is every 21 days:     Pembrolizumab      Pemetrexed      Carboplatin   **Always confirm dose/schedule in your pharmacy ordering system**  Patient Characteristics: Stage IV Metastatic, Nonsquamous, Initial Chemotherapy/Immunotherapy, PS = 0, 1, ALK Rearrangement Negative and ROS1 Rearrangement Negative and NTRK Gene Fusion?Negative and RET Gene Fusion?Negative and EGFR Mutation Negative, PD-L1 Expression Positive  1-49% (TPS) / Negative / Not Tested / Awaiting Test Results and Immunotherapy Candidate Therapeutic Status: Stage IV Metastatic Histology: Nonsquamous Cell ROS1 Rearrangement Status: Negative Other Mutations/Biomarkers: No Other Actionable Mutations Chemotherapy/Immunotherapy LOT: Initial Chemotherapy/Immunotherapy Molecular Targeted Therapy: Not Appropriate KRAS G12C Mutation Status: G12C Positive MET Exon 14 Mutation Status: Negative RET Gene Fusion Status: Negative EGFR Mutation Status: Negative/Wild Type NTRK Gene Fusion Status: Negative PD-L1 Expression Status: PD-L1 Positive 1-49% (TPS) ALK Rearrangement Status: Negative BRAF V600E Mutation Status: Negative ECOG Performance Status: 0 Biomarker Assessment Status Confirmation: All Genomic Markers Negative, or Only MET+ or BRAF+ or KRAS G12C+ Immunotherapy Candidate Status: Candidate for Immunotherapy Intent of Therapy: Non-Curative / Palliative Intent, Discussed with Patient

## 2021-01-14 ENCOUNTER — Telehealth: Payer: Self-pay | Admitting: *Deleted

## 2021-01-14 NOTE — Telephone Encounter (Signed)
Pts insurance does not cover the [employer].  BI would like for [place] to find out what meds are covered for her or if we can please give her pt assistance forms.  Will forward to triage to follow up on Monday.

## 2021-01-19 ENCOUNTER — Inpatient Hospital Stay: Payer: BC Managed Care – PPO | Attending: Oncology

## 2021-01-19 ENCOUNTER — Inpatient Hospital Stay (HOSPITAL_BASED_OUTPATIENT_CLINIC_OR_DEPARTMENT_OTHER): Payer: BC Managed Care – PPO | Admitting: Oncology

## 2021-01-19 ENCOUNTER — Inpatient Hospital Stay: Payer: BC Managed Care – PPO

## 2021-01-19 ENCOUNTER — Other Ambulatory Visit: Payer: Self-pay

## 2021-01-19 ENCOUNTER — Other Ambulatory Visit: Payer: Self-pay | Admitting: *Deleted

## 2021-01-19 VITALS — BP 117/68 | HR 92 | Temp 97.8°F | Resp 18 | Ht 65.0 in | Wt 158.2 lb

## 2021-01-19 DIAGNOSIS — Z79899 Other long term (current) drug therapy: Secondary | ICD-10-CM | POA: Diagnosis not present

## 2021-01-19 DIAGNOSIS — C3411 Malignant neoplasm of upper lobe, right bronchus or lung: Secondary | ICD-10-CM | POA: Diagnosis not present

## 2021-01-19 DIAGNOSIS — Z170 Estrogen receptor positive status [ER+]: Secondary | ICD-10-CM | POA: Diagnosis not present

## 2021-01-19 DIAGNOSIS — C3412 Malignant neoplasm of upper lobe, left bronchus or lung: Secondary | ICD-10-CM

## 2021-01-19 DIAGNOSIS — Z853 Personal history of malignant neoplasm of breast: Secondary | ICD-10-CM | POA: Diagnosis not present

## 2021-01-19 DIAGNOSIS — Z923 Personal history of irradiation: Secondary | ICD-10-CM | POA: Diagnosis not present

## 2021-01-19 DIAGNOSIS — C3431 Malignant neoplasm of lower lobe, right bronchus or lung: Secondary | ICD-10-CM | POA: Insufficient documentation

## 2021-01-19 DIAGNOSIS — Z85048 Personal history of other malignant neoplasm of rectum, rectosigmoid junction, and anus: Secondary | ICD-10-CM | POA: Diagnosis not present

## 2021-01-19 DIAGNOSIS — Z5111 Encounter for antineoplastic chemotherapy: Secondary | ICD-10-CM | POA: Insufficient documentation

## 2021-01-19 DIAGNOSIS — C3490 Malignant neoplasm of unspecified part of unspecified bronchus or lung: Secondary | ICD-10-CM

## 2021-01-19 LAB — CBC WITH DIFFERENTIAL (CANCER CENTER ONLY)
Abs Immature Granulocytes: 0.02 10*3/uL (ref 0.00–0.07)
Basophils Absolute: 0 10*3/uL (ref 0.0–0.1)
Basophils Relative: 1 %
Eosinophils Absolute: 0.2 10*3/uL (ref 0.0–0.5)
Eosinophils Relative: 3 %
HCT: 43.6 % (ref 36.0–46.0)
Hemoglobin: 14.7 g/dL (ref 12.0–15.0)
Immature Granulocytes: 0 %
Lymphocytes Relative: 16 %
Lymphs Abs: 0.8 10*3/uL (ref 0.7–4.0)
MCH: 36.1 pg — ABNORMAL HIGH (ref 26.0–34.0)
MCHC: 33.7 g/dL (ref 30.0–36.0)
MCV: 107.1 fL — ABNORMAL HIGH (ref 80.0–100.0)
Monocytes Absolute: 0.7 10*3/uL (ref 0.1–1.0)
Monocytes Relative: 14 %
Neutro Abs: 3.3 10*3/uL (ref 1.7–7.7)
Neutrophils Relative %: 66 %
Platelet Count: 192 10*3/uL (ref 150–400)
RBC: 4.07 MIL/uL (ref 3.87–5.11)
RDW: 13 % (ref 11.5–15.5)
WBC Count: 5 10*3/uL (ref 4.0–10.5)
nRBC: 0 % (ref 0.0–0.2)

## 2021-01-19 LAB — CMP (CANCER CENTER ONLY)
ALT: 15 U/L (ref 0–44)
AST: 18 U/L (ref 15–41)
Albumin: 4.1 g/dL (ref 3.5–5.0)
Alkaline Phosphatase: 61 U/L (ref 38–126)
Anion gap: 10 (ref 5–15)
BUN: 19 mg/dL (ref 6–20)
CO2: 32 mmol/L (ref 22–32)
Calcium: 9.6 mg/dL (ref 8.9–10.3)
Chloride: 96 mmol/L — ABNORMAL LOW (ref 98–111)
Creatinine: 0.78 mg/dL (ref 0.44–1.00)
GFR, Estimated: >60 mL/min (ref >60)
Glucose, Bld: 94 mg/dL (ref 70–99)
Potassium: 4.5 mmol/L (ref 3.5–5.1)
Sodium: 138 mmol/L (ref 135–145)
Total Bilirubin: 0.5 mg/dL (ref 0.3–1.2)
Total Protein: 7.5 g/dL (ref 6.5–8.1)

## 2021-01-19 LAB — TSH: TSH: 2.009 u[IU]/mL (ref 0.350–4.500)

## 2021-01-19 MED ORDER — SODIUM CHLORIDE 0.9 % IV SOLN
500.0000 mg/m2 | Freq: Once | INTRAVENOUS | Status: AC
  Administered 2021-01-19: 900 mg via INTRAVENOUS
  Filled 2021-01-19: qty 20

## 2021-01-19 MED ORDER — HEPARIN SOD (PORK) LOCK FLUSH 100 UNIT/ML IV SOLN
500.0000 [IU] | Freq: Once | INTRAVENOUS | Status: AC | PRN
  Administered 2021-01-19: 500 [IU]

## 2021-01-19 MED ORDER — PROCHLORPERAZINE MALEATE 10 MG PO TABS
10.0000 mg | ORAL_TABLET | Freq: Once | ORAL | Status: AC
  Administered 2021-01-19: 10 mg via ORAL
  Filled 2021-01-19: qty 1

## 2021-01-19 MED ORDER — SODIUM CHLORIDE 0.9% FLUSH: 10.0000 mL | INTRAVENOUS | Status: DC | PRN

## 2021-01-19 MED ORDER — SODIUM CHLORIDE 0.9 % IV SOLN
Freq: Once | INTRAVENOUS | Status: AC
Start: 2021-01-19 — End: 2021-01-19

## 2021-01-19 MED ORDER — SODIUM CHLORIDE 0.9 % IV SOLN
200.0000 mg | Freq: Once | INTRAVENOUS | Status: AC
  Administered 2021-01-19: 200 mg via INTRAVENOUS
  Filled 2021-01-19: qty 8

## 2021-01-19 NOTE — Progress Notes (Signed)
Patient presents for treatment. RN assessment completed along with the following:  Treatment Conditions (labs/vitals/weight) reviewed - Yes, and within treatment parameters.   Oncology Treatment Attestation completed for current therapy- Yes, on date 02/04/20 Informed consent completed and reflects current therapy/intent - Yes, on date 02/04/20             Provider progress note reviewed - Yes, today's provider note was reviewed. Treatment/Antibody/Supportive plan reviewedYes, and there are no adjustments needed for today's treatment."} S&H and other orders reviewed - Yes, and there are no additional orders identified. Previous treatment date reviewed - Yes, and the appropriate amount of time has elapsed between treatments.   Patient to proceed with treatment.

## 2021-01-19 NOTE — Patient Instructions (Signed)
[facility]  Discharge Instructions: Thank you for choosing [facility] to provide your oncology and hematology care.   If you have a lab appointment with the [facility], please go directly to the [facility] and check in at the registration area.   Wear comfortable clothing and clothing appropriate for easy access to any Portacath or PICC line.   We strive to give you quality time with your provider. You may need to reschedule your appointment if you arrive late (15 or more minutes).  Arriving late affects you and other patients whose appointments are after yours.  Also, if you miss three or more appointments without notifying the office, you may be dismissed from the clinic at the provider's discretion.      For prescription refill requests, have your pharmacy contact our office and allow 72 hours for refills to be completed.    Today you received the following chemotherapy and/or immunotherapy agents keytruda and alimta      To help prevent nausea and vomiting after your treatment, we encourage you to take your nausea medication as directed.  BELOW ARE SYMPTOMS THAT SHOULD BE REPORTED IMMEDIATELY: *FEVER GREATER THAN 100.4 F (38 C) OR HIGHER *CHILLS OR SWEATING *NAUSEA AND VOMITING THAT IS NOT CONTROLLED WITH YOUR NAUSEA MEDICATION *UNUSUAL SHORTNESS OF BREATH *UNUSUAL BRUISING OR BLEEDING *URINARY PROBLEMS (pain or burning when urinating, or frequent urination) *BOWEL PROBLEMS (unusual diarrhea, constipation, pain near the anus) TENDERNESS IN MOUTH AND THROAT WITH OR WITHOUT PRESENCE OF ULCERS (sore throat, sores in mouth, or a toothache) UNUSUAL RASH, SWELLING OR PAIN  UNUSUAL VAGINAL DISCHARGE OR ITCHING   Items with * indicate a potential emergency and should be followed up as soon as possible or go to the Emergency Department if any problems should occur.  Please show the CHEMOTHERAPY ALERT CARD or IMMUNOTHERAPY ALERT CARD at  check-in to the Emergency Department and triage nurse.  Should you have questions after your visit or need to cancel or reschedule your appointment, please contact [facility]  Dept: [phone]  and follow the prompts.  Office hours are 8:00 a.m. to 4:30 p.m. Monday - Friday. Please note that voicemails left after 4:00 p.m. may not be returned until the following business day.  We are closed weekends and major holidays. You have access to a nurse at all times for urgent questions. Please call the main number to the clinic Dept: [phone] and follow the prompts.   For any non-urgent questions, you may also contact your provider using MyChart. We now offer e-Visits for anyone 42 and older to request care online for non-urgent symptoms. For details visit [email or web address].   Also download the MyChart app! Go to the app store, search "MyChart", open the app, select [facility], and log in with your MyChart username and password.  Due to Covid, a mask is required upon entering the hospital/clinic. If you do not have a mask, one will be given to you upon arrival. For doctor visits, patients may have 1 support person aged 40 or older with them. For treatment visits, patients cannot have anyone with them due to current Covid guidelines and our immunocompromised population.    Pembrolizumab injection What is this medication? PEMBROLIZUMAB (pem broe liz ue mab) is a monoclonal antibody. It is used to treat certain types of cancer. This medicine may be used for other purposes; ask your health care provider or pharmacist if you have questions. COMMON BRAND  NAME(S): Keytruda What should I tell my care team before I take this medication? They need to know if you have any of these conditions: autoimmune diseases like Crohn's disease, ulcerative colitis, or lupus have had or planning to have an allogeneic stem cell transplant (uses someone else's stem cells) history of  organ transplant history of chest radiation nervous system problems like myasthenia gravis or Guillain-Barre syndrome an unusual or allergic reaction to pembrolizumab, other medicines, foods, dyes, or preservatives pregnant or trying to get pregnant breast-feeding How should I use this medication? This medicine is for infusion into a vein. It is given by a health care professional in a hospital or clinic setting. A special MedGuide will be given to you before each treatment. Be sure to read this information carefully each time. Talk to your pediatrician regarding the use of this medicine in children. While this drug may be prescribed for children as young as 6 months for selected conditions, precautions do apply. Overdosage: If you think you have taken too much of this medicine contact a poison control center or emergency room at once. NOTE: This medicine is only for you. Do not share this medicine with others. What if I miss a dose? It is important not to miss your dose. Call your doctor or health care professional if you are unable to keep an appointment. What may interact with this medication? Interactions have not been studied. This list may not describe all possible interactions. Give your health care provider a list of all the medicines, herbs, non-prescription drugs, or dietary supplements you use. Also tell them if you smoke, drink alcohol, or use illegal drugs. Some items may interact with your medicine. What should I watch for while using this medication? Your condition will be monitored carefully while you are receiving this medicine. You may need blood work done while you are taking this medicine. Do not become pregnant while taking this medicine or for 4 months after stopping it. Women should inform their doctor if they wish to become pregnant or think they might be pregnant. There is a potential for serious side effects to an unborn child. Talk to your health care professional or  pharmacist for more information. Do not breast-feed an infant while taking this medicine or for 4 months after the last dose. What side effects may I notice from receiving this medication? Side effects that you should report to your doctor or health care professional as soon as possible: allergic reactions like skin rash, itching or hives, swelling of the face, lips, or tongue bloody or black, tarry breathing problems changes in vision chest pain chills confusion constipation cough diarrhea dizziness or feeling faint or lightheaded fast or irregular heartbeat fever flushing joint pain low blood counts - this medicine may decrease the number of white blood cells, red blood cells and platelets. You may be at increased risk for infections and bleeding. muscle pain muscle weakness pain, tingling, numbness in the hands or feet persistent headache redness, blistering, peeling or loosening of the skin, including inside the mouth signs and symptoms of high blood sugar such as dizziness; dry mouth; dry skin; fruity breath; nausea; stomach pain; increased hunger or thirst; increased urination signs and symptoms of kidney injury like trouble passing urine or change in the amount of urine signs and symptoms of liver injury like dark urine, light-colored stools, loss of appetite, nausea, right upper belly pain, yellowing of the eyes or skin sweating swollen lymph nodes weight loss Side effects that usually do  not require medical attention (report to your doctor or health care professional if they continue or are bothersome): decreased appetite hair loss tiredness This list may not describe all possible side effects. Call your doctor for medical advice about side effects. You may report side effects to FDA at 1-800-FDA-1088. Where should I keep my medication? This drug is given in a hospital or clinic and will not be stored at home. NOTE: This sheet is a summary. It may not cover all possible  information. If you have questions about this medicine, talk to your doctor, pharmacist, or health care provider.  2022 Elsevier/Gold Standard (2019-03-26 21:44:53)  Pemetrexed injection What is this medication? PEMETREXED (PEM e TREX ed) is a chemotherapy drug used to treat lung cancers like non-small cell lung cancer and mesothelioma. It may also be used to treat other cancers. This medicine may be used for other purposes; ask your health care provider or pharmacist if you have questions. COMMON BRAND NAME(S): Alimta, PEMFEXY What should I tell my care team before I take this medication? They need to know if you have any of these conditions: infection (especially a virus infection such as chickenpox, cold sores, or herpes) kidney disease low blood counts, like low white cell, platelet, or red cell counts lung or breathing disease, like asthma radiation therapy an unusual or allergic reaction to pemetrexed, other medicines, foods, dyes, or preservative pregnant or trying to get pregnant breast-feeding How should I use this medication? This drug is given as an infusion into a vein. It is administered in a hospital or clinic by a specially trained health care professional. Talk to your pediatrician regarding the use of this medicine in children. Special care may be needed. Overdosage: If you think you have taken too much of this medicine contact a poison control center or emergency room at once. NOTE: This medicine is only for you. Do not share this medicine with others. What if I miss a dose? It is important not to miss your dose. Call your doctor or health care professional if you are unable to keep an appointment. What may interact with this medication? This medicine may interact with the following medications: Ibuprofen This list may not describe all possible interactions. Give your health care provider a list of all the medicines, herbs, non-prescription drugs, or dietary supplements  you use. Also tell them if you smoke, drink alcohol, or use illegal drugs. Some items may interact with your medicine. What should I watch for while using this medication? Visit your doctor for checks on your progress. This drug may make you feel generally unwell. This is not uncommon, as chemotherapy can affect healthy cells as well as cancer cells. Report any side effects. Continue your course of treatment even though you feel ill unless your doctor tells you to stop. In some cases, you may be given additional medicines to help with side effects. Follow all directions for their use. Call your doctor or health care professional for advice if you get a fever, chills or sore throat, or other symptoms of a cold or flu. Do not treat yourself. This drug decreases your body's ability to fight infections. Try to avoid being around people who are sick. This medicine may increase your risk to bruise or bleed. Call your doctor or health care professional if you notice any unusual bleeding. Be careful brushing and flossing your teeth or using a toothpick because you may get an infection or bleed more easily. If you have any dental work  done, tell your dentist you are receiving this medicine. Avoid taking products that contain aspirin, acetaminophen, ibuprofen, naproxen, or ketoprofen unless instructed by your doctor. These medicines may hide a fever. Call your doctor or health care professional if you get diarrhea or mouth sores. Do not treat yourself. To protect your kidneys, drink water or other fluids as directed while you are taking this medicine. Do not become pregnant while taking this medicine or for 6 months after stopping it. Women should inform their doctor if they wish to become pregnant or think they might be pregnant. Men should not father a child while taking this medicine and for 3 months after stopping it. This may interfere with the ability to father a child. You should talk to your doctor or health  care professional if you are concerned about your fertility. There is a potential for serious side effects to an unborn child. Talk to your health care professional or pharmacist for more information. Do not breast-feed an infant while taking this medicine or for 1 week after stopping it. What side effects may I notice from receiving this medication? Side effects that you should report to your doctor or health care professional as soon as possible: allergic reactions like skin rash, itching or hives, swelling of the face, lips, or tongue breathing problems redness, blistering, peeling or loosening of the skin, including inside the mouth signs and symptoms of bleeding such as bloody or black, tarry stools; red or dark-brown urine; spitting up blood or brown material that looks like coffee grounds; red spots on the skin; unusual bruising or bleeding from the eye, gums, or nose signs and symptoms of infection like fever or chills; cough; sore throat; pain or trouble passing urine signs and symptoms of kidney injury like trouble passing urine or change in the amount of urine signs and symptoms of liver injury like dark yellow or brown urine; general ill feeling or flu-like symptoms; light-colored stools; loss of appetite; nausea; right upper belly pain; unusually weak or tired; yellowing of the eyes or skin Side effects that usually do not require medical attention (report to your doctor or health care professional if they continue or are bothersome): constipation mouth sores nausea, vomiting unusually weak or tired This list may not describe all possible side effects. Call your doctor for medical advice about side effects. You may report side effects to FDA at 1-800-FDA-1088. Where should I keep my medication? This drug is given in a hospital or clinic and will not be stored at home. NOTE: This sheet is a summary. It may not cover all possible information. If you have questions about this medicine,  talk to your doctor, pharmacist, or health care provider.  2022 Elsevier/Gold Standard (2017-06-13 16:11:33)

## 2021-01-19 NOTE — Progress Notes (Signed)
[place] OFFICE PROGRESS NOTE   Diagnosis: Non-small cell lung cancer  INTERVAL HISTORY:   Gina Cervantes returns as scheduled.  She reports feeling well.  She continues the [employer] inhaler.  No shortness of breath.  She is working.  No new complaint.  She underwent cath placement 01/12/2021.  Objective:  Vital signs in last 24 hours:  Blood pressure 117/68, pulse 92, temperature 97.8 F (36.6 C), temperature source Oral, resp. rate 18, height '5\' 5"'  (1.651 m), weight 158 lb 3.2 oz (71.8 kg), last menstrual period 04/07/2008, SpO2 97 %.  Resp: Lungs clear bilaterally Cardio: Regular rate and rhythm GI: No hepatosplenomegaly Vascular: No leg edema  Portacath/PICC-without erythema  Lab Results:  Lab Results  Component Value Date   WBC 5.0 01/19/2021   HGB 14.7 01/19/2021   HCT 43.6 01/19/2021   MCV 107.1 (H) 01/19/2021   PLT 192 01/19/2021   NEUTROABS 3.3 01/19/2021    CMP  Lab Results  Component Value Date   NA 136 11/16/2020   K 5.0 11/16/2020   CL 94 (L) 11/16/2020   CO2 35 (H) 11/16/2020   GLUCOSE 103 (H) 11/16/2020   BUN 17 11/16/2020   CREATININE 0.83 11/16/2020   CALCIUM 9.3 11/16/2020   PROT 7.3 11/16/2020   ALBUMIN 3.9 11/16/2020   AST 19 11/16/2020   ALT 16 11/16/2020   ALKPHOS 57 11/16/2020   BILITOT 0.6 11/16/2020   GFRNONAA >60 11/16/2020   GFRAA >60 01/29/2020    Lab Results  Component Value Date   CEA 5.5 (H) 08/02/2012    Medications: I have reviewed the patient's current medications.   Assessment/Plan: Anal cancer-left anal margin/anal canal mass, status post a biopsy on 08/09/2012 confirming invasive moderately differentiated squamous cell carcinoma,p16 positive.   -Staging PET scan 08/20/2012-hypermetabolic anal mass, no hypermetabolic metastases   -Initiation of concurrent radiation with cycle 1 of mitomycin C./5-fluorouracil 09/02/2012.   -She completed cycle 2 5-fluorouracil/mitomycin C. beginning 10/01/2012.   -She  completed radiation 10/10/2012.   2. Right breast cancer 2009, ER positive, PR positive, HER-2 positive. She is status post neoadjuvant AC x4 cycles followed by Taxol/Herceptin. She underwent a right lumpectomy and sentinel lymph node biopsy 09/22/2008 with no evidence of residual invasive carcinoma and 5 negative sentinel lymph nodes. She then completed right breast radiation. She began tamoxifen 12/15/2008 and completed adjuvant Herceptin 05/25/2009. The tamoxifen was discontinued and she was switched to Arimidex in July 2013.  Arimidex discontinued in mid July 2019. 3. History of enlargement of the right tonsil.   4. Nonspecific pulmonary nodules without hypermetabolic activity on the PET scan 08/20/2012. Chest CT 10/21/2012 with scattered pulmonary nodules including nodules that were not present in 2010. Annual surveillance was recommended. Follow-up chest CT 10/24/2013 with scattered groundglass densities again noted in both lungs mostly unchanged when compared to the previous study. A sub-solid right lower lobe nodule had increased central density. The size was unchanged.The nodule has been present since 2009. CT chest 06/13/2016-a sub-solid 9 mm right upper lobe nodule has enlarged compared to 2015, a right lower lobe irregular nodular density appears more well defined, new vague 5 mm groundglass left upper lobe nodule, left lower lobe 10 mm groundglass nodule is more apparent, posterior left upper lobe circumscribed solid nodule measures 11 x 10 mm and is new PET scan 06/22/2016-malignant range activity involving the 11 mm left upper lobe nodule, no other hypermetabolic nodules, suspicious sub-solid right lower lobe nodule Wedge resection of a hypermetabolic left upper lobe  nodule on 07/05/2016-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 No EGFR, ALK, BRAF, RET, ERBB2, or ROS1  alteration CT chest 11/14/2016-status post left upper lobe wedge resection, stable right lung nodules Chest CT 05/02/2017-stable bilateral solid and groundglass nodules, stable mild mediastinal lymphadenopathy CT chest 12/13/2017- enlargement of right upper lobe nodule, other lung nodules and chest lymph node stable PET 01/09/2018- right upper lobe nodule and right lower lobe nodule have enlarged since 2018 and have low level metabolic activity increased size of an 8 mm right upper lobe nodule without increased metabolic activity, stable activity and a mildly enlarged lower paratracheal node, no evidence of metastatic disease in the abdomen or pelvis CT chest 04/22/2018- no new lung nodules, dominant right lung nodules slightly increased in size Bronchoscopy/EBUS 05/04/2019 with biopsy of level 7 node, biopsy of right lower lobe nodule, FNA of right lower lobe nodule-negative CT biopsy of right upper lobe nodule 06/07/2018- adenocarcinoma with lepidic and acinar patterns SBRT to the dominant right upper lobe nodule 07/23/2018-07/30/2018 CT 09/16/2018-slight increase in size of medial right upper lobe nodule-possibly secondary to interval radiation, other nodules are unchanged CT 12/25/2018- medial right upper lobe nodule has decreased in size slightly, multiple additional solid and groundglass nodules are stable CT 06/09/2019-enlarging right lower lobe nodule, enlarging subsolid nodule, new right upper lobe nodule SBRT to right lower lobe lung nodule, 3 fractions 07/08/2019-07/14/2019 CT 12/05/2019-dominant right lower lobe lesion stable to slightly decreased in size, progressive solid component associated with a dominant right upper lung nodule, mild progression of a left upper lobe nodule, new subsolid anterior medial left upper lobe nodule Cycle 1 Alimta/carboplatin/pembrolizumab 02/04/2020 Cycle 2 Alimta/carboplatin/pembrolizumab 02/23/2020 Cycle 3 Alimta/carboplatin/pembrolizumab 03/15/2020 Cycle 4  Alimta/carboplatin/pembrolizumab 04/05/2020 (Alimta and carboplatin dose reduced secondary to anemia) CT chest 04/12/2020-decreased size of solid right lower lobe and right upper lobe nodules, groundglass nodules in the left lung unchanged Cycle 5 Alimta/pembrolizumab 05/03/2020 Cycle 6 Alimta/pembrolizumab 05/21/2020 Cycle 7 Alimta/pembrolizumab 06/14/2020 Cycle 8 Alimta/pembrolizumab 07/05/2020 CT chest 07/22/2020-stable nodules, no evidence of disease progression, unchanged multifocal groundglass nodules suspicious for multifocal adenocarcinoma. Cycle 9 Alimta/Pembrolizumab 07/26/2020 Cycle 10 Alimta/Pembrolizumab 08/16/2020 Cycle 11 Alimta/Pembrolizumab 09/06/2020 Cycle 12 Alimta/pembrolizumab 09/27/2020 Cycle 13 Alimta/pembrolizumab 10/18/2020 CT chest 11/12/2020-enlarging soft tissue density right upper lobe medially adjacent to the mediastinum measuring 2.7 x 2.1 cm, concerning for recurrent tumor.  Stable radiation changes involving the right upper lobe and right lower lobe.  Stable scattered small solid and subsolid pulmonary nodules bilaterally.  New right-sided pleural effusion.  Stable borderline enlarged mediastinal lymph nodes. Bronchoscopy 12/28/2020-right upper lobe and left lower lobe lesions biopsy-negative pathology PET 9/79/4801-KPV metabolic activity involving the right suprahilar consolidative process-favored benign, measured smaller, additional groundglass densities with very low-level metabolic activity, radiation change in the right lower lobe, resolution of right pleural effusion, no distant metastatic disease Cycle 14 Alimta/pembrolizumab 01/19/2021 5.  Baker's cyst left leg, pain and swelling in the left calf, negative Doppler 01/24/2020 and 01/29/2020-evaluated by orthopedics and underwent aspiration of the cyst 01/29/2000, completed a course of Keflex 6.  Anemia secondary to chemotherapy, status post 2 units of packed red blood cells on 04/10/2020-improved        Disposition: Ms.  Gina Cervantes appears stable.  She is asymptomatic from lung cancer at present.  I discussed treatment options with Dr. Aniceto Boss after she was here on 12/31/2020.  He recommends continuing Alimta/pembrolizumab as opposed to observation.  She is in agreement.  The plan is to resume Alimta/pembrolizumab today.  We will plan for a restaging CT approximately 3 months from the August PET scan.  She will return for an office visit and the next cycle of chemotherapy in 3 weeks.  Betsy Coder, MD  01/19/2021  12:25 PM

## 2021-01-19 NOTE — Patient Instructions (Signed)
Implanted Port Home Guide An implanted port is a device that is placed under the skin. It is usually placed in the chest. The device can be used to give IV medicine, to take blood, or for dialysis. You may have an implanted port if: You need IV medicine that would be irritating to the small veins in your hands or arms. You need IV medicines, such as antibiotics, for a long period of time. You need IV nutrition for a long period of time. You need dialysis. When you have a port, your health care provider can choose to use the port instead of veins in your arms for these procedures. You may have fewer limitations when using a port than you would if you used other types of long-term IVs, and you will likely be able to return to normal activities after your incision heals. An implanted port has two main parts: Reservoir. The reservoir is the part where a needle is inserted to give medicines or draw blood. The reservoir is round. After it is placed, it appears as a small, raised area under your skin. Catheter. The catheter is a thin, flexible tube that connects the reservoir to a vein. Medicine that is inserted into the reservoir goes into the catheter and then into the vein. How is my port accessed? To access your port: A numbing cream may be placed on the skin over the port site. Your health care provider will put on a mask and sterile gloves. The skin over your port will be cleaned carefully with a germ-killing soap and allowed to dry. Your health care provider will gently pinch the port and insert a needle into it. Your health care provider will check for a blood return to make sure the port is in the vein and is not clogged. If your port needs to remain accessed to get medicine continuously (constant infusion), your health care provider will place a clear bandage (dressing) over the needle site. The dressing and needle will need to be changed every week, or as told by your health care provider. What  is flushing? Flushing helps keep the port from getting clogged. Follow instructions from your health care provider about how and when to flush the port. Ports are usually flushed with saline solution or a medicine called heparin. The need for flushing will depend on how the port is used: If the port is only used from time to time to give medicines or draw blood, the port may need to be flushed: Before and after medicines have been given. Before and after blood has been drawn. As part of routine maintenance. Flushing may be recommended every 4-6 weeks. If a constant infusion is running, the port may not need to be flushed. Throw away any syringes in a disposal container that is meant for sharp items (sharps container). You can buy a sharps container from a pharmacy, or you can make one by using an empty hard plastic bottle with a cover. How long will my port stay implanted? The port can stay in for as long as your health care provider thinks it is needed. When it is time for the port to come out, a surgery will be done to remove it. The surgery will be similar to the procedure that was done to put the port in. Follow these instructions at home:  Flush your port as told by your health care provider. If you need an infusion over several days, follow instructions from your health care provider about how   to take care of your port site. Make sure you: Wash your hands with soap and water before you change your dressing. If soap and water are not available, use alcohol-based hand sanitizer. Change your dressing as told by your health care provider. Place any used dressings or infusion bags into a plastic bag. Throw that bag in the trash. Keep the dressing that covers the needle clean and dry. Do not get it wet. Do not use scissors or sharp objects near the tube. Keep the tube clamped, unless it is being used. Check your port site every day for signs of infection. Check for: Redness, swelling, or  pain. Fluid or blood. Pus or a bad smell. Protect the skin around the port site. Avoid wearing bra straps that rub or irritate the site. Protect the skin around your port from seat belts. Place a soft pad over your chest if needed. Bathe or shower as told by your health care provider. The site may get wet as long as you are not actively receiving an infusion. Return to your normal activities as told by your health care provider. Ask your health care provider what activities are safe for you. Carry a medical alert card or wear a medical alert bracelet at all times. This will let health care providers know that you have an implanted port in case of an emergency. Get help right away if: You have redness, swelling, or pain at the port site. You have fluid or blood coming from your port site. You have pus or a bad smell coming from the port site. You have a fever. Summary Implanted ports are usually placed in the chest for long-term IV access. Follow instructions from your health care provider about flushing the port and changing bandages (dressings). Take care of the area around your port by avoiding clothing that puts pressure on the area, and by watching for signs of infection. Protect the skin around your port from seat belts. Place a soft pad over your chest if needed. Get help right away if you have a fever or you have redness, swelling, pain, drainage, or a bad smell at the port site. This information is not intended to replace advice given to you by your health care provider. Make sure you discuss any questions you have with your health care provider. Document Revised: 07/14/2020 Document Reviewed: 09/08/2019 Elsevier Patient Education  2022 [employer] Inc[employer]  

## 2021-01-28 ENCOUNTER — Other Ambulatory Visit: Payer: Self-pay | Admitting: Oncology

## 2021-02-05 ENCOUNTER — Other Ambulatory Visit: Payer: Self-pay | Admitting: Oncology

## 2021-02-07 ENCOUNTER — Inpatient Hospital Stay: Payer: BC Managed Care – PPO | Attending: Oncology

## 2021-02-07 ENCOUNTER — Other Ambulatory Visit: Payer: Self-pay

## 2021-02-07 ENCOUNTER — Inpatient Hospital Stay: Payer: BC Managed Care – PPO

## 2021-02-07 ENCOUNTER — Inpatient Hospital Stay (HOSPITAL_BASED_OUTPATIENT_CLINIC_OR_DEPARTMENT_OTHER): Payer: BC Managed Care – PPO | Admitting: Oncology

## 2021-02-07 VITALS — BP 122/73 | HR 93 | Temp 98.1°F | Resp 18 | Ht 65.0 in | Wt 161.0 lb

## 2021-02-07 DIAGNOSIS — Z170 Estrogen receptor positive status [ER+]: Secondary | ICD-10-CM | POA: Diagnosis not present

## 2021-02-07 DIAGNOSIS — Z923 Personal history of irradiation: Secondary | ICD-10-CM | POA: Diagnosis not present

## 2021-02-07 DIAGNOSIS — Z5111 Encounter for antineoplastic chemotherapy: Secondary | ICD-10-CM | POA: Insufficient documentation

## 2021-02-07 DIAGNOSIS — C3490 Malignant neoplasm of unspecified part of unspecified bronchus or lung: Secondary | ICD-10-CM

## 2021-02-07 DIAGNOSIS — C3431 Malignant neoplasm of lower lobe, right bronchus or lung: Secondary | ICD-10-CM | POA: Diagnosis not present

## 2021-02-07 DIAGNOSIS — Z79899 Other long term (current) drug therapy: Secondary | ICD-10-CM | POA: Insufficient documentation

## 2021-02-07 DIAGNOSIS — C3412 Malignant neoplasm of upper lobe, left bronchus or lung: Secondary | ICD-10-CM

## 2021-02-07 DIAGNOSIS — Z85048 Personal history of other malignant neoplasm of rectum, rectosigmoid junction, and anus: Secondary | ICD-10-CM | POA: Insufficient documentation

## 2021-02-07 DIAGNOSIS — Z853 Personal history of malignant neoplasm of breast: Secondary | ICD-10-CM | POA: Insufficient documentation

## 2021-02-07 DIAGNOSIS — C3411 Malignant neoplasm of upper lobe, right bronchus or lung: Secondary | ICD-10-CM | POA: Insufficient documentation

## 2021-02-07 DIAGNOSIS — D6481 Anemia due to antineoplastic chemotherapy: Secondary | ICD-10-CM | POA: Insufficient documentation

## 2021-02-07 LAB — CBC WITH DIFFERENTIAL (CANCER CENTER ONLY)
Abs Immature Granulocytes: 0.02 10*3/uL (ref 0.00–0.07)
Basophils Absolute: 0 10*3/uL (ref 0.0–0.1)
Basophils Relative: 1 %
Eosinophils Absolute: 0.2 10*3/uL (ref 0.0–0.5)
Eosinophils Relative: 3 %
HCT: 38.5 % (ref 36.0–46.0)
Hemoglobin: 12.8 g/dL (ref 12.0–15.0)
Immature Granulocytes: 0 %
Lymphocytes Relative: 14 %
Lymphs Abs: 0.8 10*3/uL (ref 0.7–4.0)
MCH: 35.5 pg — ABNORMAL HIGH (ref 26.0–34.0)
MCHC: 33.2 g/dL (ref 30.0–36.0)
MCV: 106.6 fL — ABNORMAL HIGH (ref 80.0–100.0)
Monocytes Absolute: 0.9 10*3/uL (ref 0.1–1.0)
Monocytes Relative: 16 %
Neutro Abs: 3.8 10*3/uL (ref 1.7–7.7)
Neutrophils Relative %: 66 %
Platelet Count: 303 10*3/uL (ref 150–400)
RBC: 3.61 MIL/uL — ABNORMAL LOW (ref 3.87–5.11)
RDW: 14.1 % (ref 11.5–15.5)
WBC Count: 5.7 10*3/uL (ref 4.0–10.5)
nRBC: 0 % (ref 0.0–0.2)

## 2021-02-07 LAB — CMP (CANCER CENTER ONLY)
ALT: 14 U/L (ref 0–44)
AST: 20 U/L (ref 15–41)
Albumin: 4 g/dL (ref 3.5–5.0)
Alkaline Phosphatase: 67 U/L (ref 38–126)
Anion gap: 7 (ref 5–15)
BUN: 17 mg/dL (ref 6–20)
CO2: 33 mmol/L — ABNORMAL HIGH (ref 22–32)
Calcium: 9.4 mg/dL (ref 8.9–10.3)
Chloride: 98 mmol/L (ref 98–111)
Creatinine: 0.75 mg/dL (ref 0.44–1.00)
GFR, Estimated: >60 mL/min (ref >60)
Glucose, Bld: 95 mg/dL (ref 70–99)
Potassium: 4.4 mmol/L (ref 3.5–5.1)
Sodium: 138 mmol/L (ref 135–145)
Total Bilirubin: 0.6 mg/dL (ref 0.3–1.2)
Total Protein: 7.3 g/dL (ref 6.5–8.1)

## 2021-02-07 MED ORDER — DOXYCYCLINE HYCLATE 100 MG PO TABS: 100.0000 mg | ORAL_TABLET | Freq: Two times a day (BID) | ORAL | 0 refills | Status: AC

## 2021-02-07 MED ORDER — SODIUM CHLORIDE 0.9 % IV SOLN: Freq: Once | INTRAVENOUS | Status: AC

## 2021-02-07 MED ORDER — HEPARIN SOD (PORK) LOCK FLUSH 100 UNIT/ML IV SOLN
500.0000 [IU] | Freq: Once | INTRAVENOUS | Status: AC | PRN
  Administered 2021-02-07: 500 [IU]

## 2021-02-07 MED ORDER — SODIUM CHLORIDE 0.9 % IV SOLN
200.0000 mg | Freq: Once | INTRAVENOUS | Status: AC
  Administered 2021-02-07: 200 mg via INTRAVENOUS
  Filled 2021-02-07: qty 8

## 2021-02-07 MED ORDER — FOLIC ACID 1 MG PO TABS: 1.0000 mg | ORAL_TABLET | Freq: Every day | ORAL | 1 refills | Status: DC

## 2021-02-07 MED ORDER — ALBUTEROL SULFATE HFA 108 (90 BASE) MCG/ACT IN AERS: INHALATION_SPRAY | RESPIRATORY_TRACT | 2 refills | Status: DC

## 2021-02-07 MED ORDER — SODIUM CHLORIDE 0.9 % IV SOLN
500.0000 mg/m2 | Freq: Once | INTRAVENOUS | Status: AC
  Administered 2021-02-07: 900 mg via INTRAVENOUS
  Filled 2021-02-07: qty 20

## 2021-02-07 MED ORDER — SODIUM CHLORIDE 0.9% FLUSH
10.0000 mL | INTRAVENOUS | Status: DC | PRN
  Administered 2021-02-07: 10 mL

## 2021-02-07 MED ORDER — PROCHLORPERAZINE MALEATE 10 MG PO TABS
10.0000 mg | ORAL_TABLET | Freq: Once | ORAL | Status: AC
  Administered 2021-02-07: 10 mg via ORAL
  Filled 2021-02-07: qty 1

## 2021-02-07 NOTE — Progress Notes (Signed)
[place] OFFICE PROGRESS NOTE   Diagnosis: Non-small cell lung cancer  INTERVAL HISTORY:   [name] completed another cycle of Alimta/pembrolizumab on 01/19/2021.  No rash or diarrhea.  She feels well.  She has noted to mild tenderness surrounding the Port-A-Cath with erythema.  No fever or chills.  Objective:  Vital signs in last 24 hours:  Blood pressure 122/73, pulse 93, temperature 98.1 F (36.7 C), temperature source Oral, resp. rate 18, height '5\' 5"'  (1.651 m), weight 161 lb (73 kg), last menstrual period 04/07/2008, SpO2 95 %.    HEENT: No thrush or ulcers Resp: Lungs clear bilaterally Cardio: Regular rate and rhythm GI: No hepatosplenomegaly Vascular: No leg edema     Portacath/PICC-faint erythema surrounding the Port-A-Cath with mild tenderness along the subcutaneous tract, no erythema along the subcutaneous tract, no fluctuance  Lab Results:  Lab Results  Component Value Date   WBC 5.7 02/07/2021   HGB 12.8 02/07/2021   HCT 38.5 02/07/2021   MCV 106.6 (H) 02/07/2021   PLT 303 02/07/2021   NEUTROABS 3.8 02/07/2021    CMP  Lab Results  Component Value Date   NA 138 01/19/2021   K 4.5 01/19/2021   CL 96 (L) 01/19/2021   CO2 32 01/19/2021   GLUCOSE 94 01/19/2021   BUN 19 01/19/2021   CREATININE 0.78 01/19/2021   CALCIUM 9.6 01/19/2021   PROT 7.5 01/19/2021   ALBUMIN 4.1 01/19/2021   AST 18 01/19/2021   ALT 15 01/19/2021   ALKPHOS 61 01/19/2021   BILITOT 0.5 01/19/2021   GFRNONAA >60 01/19/2021   GFRAA >60 01/29/2020    Lab Results  Component Value Date   CEA 5.5 (H) 08/02/2012     Medications: I have reviewed the patient's current medications.   Assessment/Plan: Anal cancer-left anal margin/anal canal mass, status post a biopsy on 08/09/2012 confirming invasive moderately differentiated squamous cell carcinoma,p16 positive.   -Staging PET scan 08/20/2012-hypermetabolic anal mass, no hypermetabolic metastases    -Initiation of concurrent radiation with cycle 1 of mitomycin C./5-fluorouracil 09/02/2012.   -She completed cycle 2 5-fluorouracil/mitomycin C. beginning 10/01/2012.   -She completed radiation 10/10/2012.   2. Right breast cancer 2009, ER positive, PR positive, HER-2 positive. She is status post neoadjuvant AC x4 cycles followed by Taxol/Herceptin. She underwent a right lumpectomy and sentinel lymph node biopsy 09/22/2008 with no evidence of residual invasive carcinoma and 5 negative sentinel lymph nodes. She then completed right breast radiation. She began tamoxifen 12/15/2008 and completed adjuvant Herceptin 05/25/2009. The tamoxifen was discontinued and she was switched to Arimidex in July 2013.  Arimidex discontinued in mid July 2019. 3. History of enlargement of the right tonsil.   4. Nonspecific pulmonary nodules without hypermetabolic activity on the PET scan 08/20/2012. Chest CT 10/21/2012 with scattered pulmonary nodules including nodules that were not present in 2010. Annual surveillance was recommended. Follow-up chest CT 10/24/2013 with scattered groundglass densities again noted in both lungs mostly unchanged when compared to the previous study. A sub-solid right lower lobe nodule had increased central density. The size was unchanged.The nodule has been present since 2009. CT chest 06/13/2016-a sub-solid 9 mm right upper lobe nodule has enlarged compared to 2015, a right lower lobe irregular nodular density appears more well defined, new vague 5 mm groundglass left upper lobe nodule, left lower lobe 10 mm groundglass nodule is more apparent, posterior left upper lobe circumscribed solid nodule measures 11 x 10 mm and is new PET scan 06/22/2016-malignant range activity involving  the 11 mm left upper lobe nodule, no other hypermetabolic nodules, suspicious sub-solid right lower lobe nodule Wedge resection of a hypermetabolic left upper lobe nodule on 07/05/2016-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 No EGFR, ALK, BRAF, RET, ERBB2, or ROS1 alteration CT chest 11/14/2016-status post left upper lobe wedge resection, stable right lung nodules Chest CT 05/02/2017-stable bilateral solid and groundglass nodules, stable mild mediastinal lymphadenopathy CT chest 12/13/2017- enlargement of right upper lobe nodule, other lung nodules and chest lymph node stable PET 01/09/2018- right upper lobe nodule and right lower lobe nodule have enlarged since 2018 and have low level metabolic activity increased size of an 8 mm right upper lobe nodule without increased metabolic activity, stable activity and a mildly enlarged lower paratracheal node, no evidence of metastatic disease in the abdomen or pelvis CT chest 04/22/2018- no new lung nodules, dominant right lung nodules slightly increased in size Bronchoscopy/EBUS 05/04/2019 with biopsy of level 7 node, biopsy of right lower lobe nodule, FNA of right lower lobe nodule-negative CT biopsy of right upper lobe nodule 06/07/2018- adenocarcinoma with lepidic and acinar patterns SBRT to the dominant right upper lobe nodule 07/23/2018-07/30/2018 CT 09/16/2018-slight increase in size of medial right upper lobe nodule-possibly secondary to interval radiation, other nodules are unchanged CT 12/25/2018- medial right upper lobe nodule has decreased in size slightly, multiple additional solid and groundglass nodules are stable CT 06/09/2019-enlarging right lower lobe nodule, enlarging subsolid nodule, new right upper lobe nodule SBRT to right lower lobe lung nodule, 3 fractions 07/08/2019-07/14/2019 CT 12/05/2019-dominant right lower lobe lesion stable to slightly decreased in size, progressive solid component associated with a dominant right upper lung nodule, mild progression of a left upper lobe nodule, new subsolid anterior medial left upper lobe  nodule Cycle 1 Alimta/carboplatin/pembrolizumab 02/04/2020 Cycle 2 Alimta/carboplatin/pembrolizumab 02/23/2020 Cycle 3 Alimta/carboplatin/pembrolizumab 03/15/2020 Cycle 4 Alimta/carboplatin/pembrolizumab 04/05/2020 (Alimta and carboplatin dose reduced secondary to anemia) CT chest 04/12/2020-decreased size of solid right lower lobe and right upper lobe nodules, groundglass nodules in the left lung unchanged Cycle 5 Alimta/pembrolizumab 05/03/2020 Cycle 6 Alimta/pembrolizumab 05/21/2020 Cycle 7 Alimta/pembrolizumab 06/14/2020 Cycle 8 Alimta/pembrolizumab 07/05/2020 CT chest 07/22/2020-stable nodules, no evidence of disease progression, unchanged multifocal groundglass nodules suspicious for multifocal adenocarcinoma. Cycle 9 Alimta/Pembrolizumab 07/26/2020 Cycle 10 Alimta/Pembrolizumab 08/16/2020 Cycle 11 Alimta/Pembrolizumab 09/06/2020 Cycle 12 Alimta/pembrolizumab 09/27/2020 Cycle 13 Alimta/pembrolizumab 10/18/2020 CT chest 11/12/2020-enlarging soft tissue density right upper lobe medially adjacent to the mediastinum measuring 2.7 x 2.1 cm, concerning for recurrent tumor.  Stable radiation changes involving the right upper lobe and right lower lobe.  Stable scattered small solid and subsolid pulmonary nodules bilaterally.  New right-sided pleural effusion.  Stable borderline enlarged mediastinal lymph nodes. Bronchoscopy 12/28/2020-right upper lobe and left lower lobe lesions biopsy-negative pathology PET 08/29/5359-WER metabolic activity involving the right suprahilar consolidative process-favored benign, measured smaller, additional groundglass densities with very low-level metabolic activity, radiation change in the right lower lobe, resolution of right pleural effusion, no distant metastatic disease Cycle 14 Alimta/pembrolizumab 01/19/2021 Cycle 15 Alimta/pembrolizumab 02/07/2021 5.  Baker's cyst left leg, pain and swelling in the left calf, negative Doppler 01/24/2020 and 01/29/2020-evaluated by orthopedics and  underwent aspiration of the cyst 01/29/2000, completed a course of Keflex 6.  Anemia secondary to chemotherapy, status post 2 units of packed red blood cells on 04/10/2020-improved      Disposition: Ms. Ressel appears stable.  She is tolerating the Alimta/pembrolizumab well.  She will  complete another cycle today.  There is mild tenderness and erythema at the Port-A-Cath site.  She does not appear to have a systemic infection.  We will reassess the Port-A-Cath after the needle is removed today.  We will prescribe a course of doxycycline if there is persistent erythema.  She will return for an office visit and Alimta/pembrolizumab in 3 weeks.  Betsy Coder, MD  02/07/2021  11:45 AM

## 2021-02-07 NOTE — Progress Notes (Signed)
Patient presents for treatment. RN assessment completed along with the following:  Labs/vitals reviewed - Yes, and within treatment parameters.   Weight within 10% of previous measurement - Yes Oncology Treatment Attestation completed for current therapy- Yes, on date 01/19/21 Informed consent completed and reflects current therapy/intent - 02/04/20             Provider progress note reviewed - Today's provider note is not yet available. I reviewed the most recent oncology provider progress note in chart dated 01/20/20. Treatment/Antibody/Supportive plan reviewed - Yes, and there are no adjustments needed for today's treatment. S&H and other orders reviewed - Yes, and there are no additional orders identified. Previous treatment date reviewed - Yes, and the appropriate amount of time has elapsed between treatments.  Patient to proceed with treatment.

## 2021-02-07 NOTE — Patient Instructions (Signed)
Implanted Port Home Guide An implanted port is a device that is placed under the skin. It is usually placed in the chest. The device can be used to give IV medicine, to take blood, or for dialysis. You may have an implanted port if: You need IV medicine that would be irritating to the small veins in your hands or arms. You need IV medicines, such as antibiotics, for a long period of time. You need IV nutrition for a long period of time. You need dialysis. When you have a port, your health care provider can choose to use the port instead of veins in your arms for these procedures. You may have fewer limitations when using a port than you would if you used other types of long-term IVs, and you will likely be able to return to normal activities after your incision heals. An implanted port has two main parts: Reservoir. The reservoir is the part where a needle is inserted to give medicines or draw blood. The reservoir is round. After it is placed, it appears as a small, raised area under your skin. Catheter. The catheter is a thin, flexible tube that connects the reservoir to a vein. Medicine that is inserted into the reservoir goes into the catheter and then into the vein. How is my port accessed? To access your port: A numbing cream may be placed on the skin over the port site. Your health care provider will put on a mask and sterile gloves. The skin over your port will be cleaned carefully with a germ-killing soap and allowed to dry. Your health care provider will gently pinch the port and insert a needle into it. Your health care provider will check for a blood return to make sure the port is in the vein and is not clogged. If your port needs to remain accessed to get medicine continuously (constant infusion), your health care provider will place a clear bandage (dressing) over the needle site. The dressing and needle will need to be changed every week, or as told by your health care provider. What  is flushing? Flushing helps keep the port from getting clogged. Follow instructions from your health care provider about how and when to flush the port. Ports are usually flushed with saline solution or a medicine called heparin. The need for flushing will depend on how the port is used: If the port is only used from time to time to give medicines or draw blood, the port may need to be flushed: Before and after medicines have been given. Before and after blood has been drawn. As part of routine maintenance. Flushing may be recommended every 4-6 weeks. If a constant infusion is running, the port may not need to be flushed. Throw away any syringes in a disposal container that is meant for sharp items (sharps container). You can buy a sharps container from a pharmacy, or you can make one by using an empty hard plastic bottle with a cover. How long will my port stay implanted? The port can stay in for as long as your health care provider thinks it is needed. When it is time for the port to come out, a surgery will be done to remove it. The surgery will be similar to the procedure that was done to put the port in. Follow these instructions at home:  Flush your port as told by your health care provider. If you need an infusion over several days, follow instructions from your health care provider about how   to take care of your port site. Make sure you: Wash your hands with soap and water before you change your dressing. If soap and water are not available, use alcohol-based hand sanitizer. Change your dressing as told by your health care provider. Place any used dressings or infusion bags into a plastic bag. Throw that bag in the trash. Keep the dressing that covers the needle clean and dry. Do not get it wet. Do not use scissors or sharp objects near the tube. Keep the tube clamped, unless it is being used. Check your port site every day for signs of infection. Check for: Redness, swelling, or  pain. Fluid or blood. Pus or a bad smell. Protect the skin around the port site. Avoid wearing bra straps that rub or irritate the site. Protect the skin around your port from seat belts. Place a soft pad over your chest if needed. Bathe or shower as told by your health care provider. The site may get wet as long as you are not actively receiving an infusion. Return to your normal activities as told by your health care provider. Ask your health care provider what activities are safe for you. Carry a medical alert card or wear a medical alert bracelet at all times. This will let health care providers know that you have an implanted port in case of an emergency. Get help right away if: You have redness, swelling, or pain at the port site. You have fluid or blood coming from your port site. You have pus or a bad smell coming from the port site. You have a fever. Summary Implanted ports are usually placed in the chest for long-term IV access. Follow instructions from your health care provider about flushing the port and changing bandages (dressings). Take care of the area around your port by avoiding clothing that puts pressure on the area, and by watching for signs of infection. Protect the skin around your port from seat belts. Place a soft pad over your chest if needed. Get help right away if you have a fever or you have redness, swelling, pain, drainage, or a bad smell at the port site. This information is not intended to replace advice given to you by your health care provider. Make sure you discuss any questions you have with your health care provider. Document Revised: 07/14/2020 Document Reviewed: 09/08/2019 Elsevier Patient Education  2022 [employer] Inc[employer]  

## 2021-02-07 NOTE — Patient Instructions (Signed)
[facility]  Discharge Instructions: Thank you for choosing [facility] to provide your oncology and hematology care.   If you have a lab appointment with the [facility], please go directly to the [facility] and check in at the registration area.   Wear comfortable clothing and clothing appropriate for easy access to any Portacath or PICC line.   We strive to give you quality time with your provider. You may need to reschedule your appointment if you arrive late (15 or more minutes).  Arriving late affects you and other patients whose appointments are after yours.  Also, if you miss three or more appointments without notifying the office, you may be dismissed from the clinic at the provider's discretion.      For prescription refill requests, have your pharmacy contact our office and allow 72 hours for refills to be completed.    Today you received the following chemotherapy and/or immunotherapy agents keytruda and alimta      To help prevent nausea and vomiting after your treatment, we encourage you to take your nausea medication as directed.  BELOW ARE SYMPTOMS THAT SHOULD BE REPORTED IMMEDIATELY: *FEVER GREATER THAN 100.4 F (38 C) OR HIGHER *CHILLS OR SWEATING *NAUSEA AND VOMITING THAT IS NOT CONTROLLED WITH YOUR NAUSEA MEDICATION *UNUSUAL SHORTNESS OF BREATH *UNUSUAL BRUISING OR BLEEDING *URINARY PROBLEMS (pain or burning when urinating, or frequent urination) *BOWEL PROBLEMS (unusual diarrhea, constipation, pain near the anus) TENDERNESS IN MOUTH AND THROAT WITH OR WITHOUT PRESENCE OF ULCERS (sore throat, sores in mouth, or a toothache) UNUSUAL RASH, SWELLING OR PAIN  UNUSUAL VAGINAL DISCHARGE OR ITCHING   Items with * indicate a potential emergency and should be followed up as soon as possible or go to the Emergency Department if any problems should occur.  Please show the CHEMOTHERAPY ALERT CARD or IMMUNOTHERAPY ALERT CARD at  check-in to the Emergency Department and triage nurse.  Should you have questions after your visit or need to cancel or reschedule your appointment, please contact [facility]  Dept: [phone]  and follow the prompts.  Office hours are 8:00 a.m. to 4:30 p.m. Monday - Friday. Please note that voicemails left after 4:00 p.m. may not be returned until the following business day.  We are closed weekends and major holidays. You have access to a nurse at all times for urgent questions. Please call the main number to the clinic Dept: [phone] and follow the prompts.   For any non-urgent questions, you may also contact your provider using MyChart. We now offer e-Visits for anyone 49 and older to request care online for non-urgent symptoms. For details visit [email or web address].   Also download the MyChart app! Go to the app store, search "MyChart", open the app, select [facility], and log in with your MyChart username and password.  Due to Covid, a mask is required upon entering the hospital/clinic. If you do not have a mask, one will be given to you upon arrival. For doctor visits, patients may have 1 support person aged 5 or older with them. For treatment visits, patients cannot have anyone with them due to current Covid guidelines and our immunocompromised population.    Pembrolizumab injection What is this medication? PEMBROLIZUMAB (pem broe liz ue mab) is a monoclonal antibody. It is used to treat certain types of cancer. This medicine may be used for other purposes; ask your health care provider or pharmacist if you have questions. COMMON BRAND  NAME(S): Keytruda What should I tell my care team before I take this medication? They need to know if you have any of these conditions: autoimmune diseases like Crohn's disease, ulcerative colitis, or lupus have had or planning to have an allogeneic stem cell transplant (uses someone else's stem cells) history of  organ transplant history of chest radiation nervous system problems like myasthenia gravis or Guillain-Barre syndrome an unusual or allergic reaction to pembrolizumab, other medicines, foods, dyes, or preservatives pregnant or trying to get pregnant breast-feeding How should I use this medication? This medicine is for infusion into a vein. It is given by a health care professional in a hospital or clinic setting. A special MedGuide will be given to you before each treatment. Be sure to read this information carefully each time. Talk to your pediatrician regarding the use of this medicine in children. While this drug may be prescribed for children as young as 6 months for selected conditions, precautions do apply. Overdosage: If you think you have taken too much of this medicine contact a poison control center or emergency room at once. NOTE: This medicine is only for you. Do not share this medicine with others. What if I miss a dose? It is important not to miss your dose. Call your doctor or health care professional if you are unable to keep an appointment. What may interact with this medication? Interactions have not been studied. This list may not describe all possible interactions. Give your health care provider a list of all the medicines, herbs, non-prescription drugs, or dietary supplements you use. Also tell them if you smoke, drink alcohol, or use illegal drugs. Some items may interact with your medicine. What should I watch for while using this medication? Your condition will be monitored carefully while you are receiving this medicine. You may need blood work done while you are taking this medicine. Do not become pregnant while taking this medicine or for 4 months after stopping it. Women should inform their doctor if they wish to become pregnant or think they might be pregnant. There is a potential for serious side effects to an unborn child. Talk to your health care professional or  pharmacist for more information. Do not breast-feed an infant while taking this medicine or for 4 months after the last dose. What side effects may I notice from receiving this medication? Side effects that you should report to your doctor or health care professional as soon as possible: allergic reactions like skin rash, itching or hives, swelling of the face, lips, or tongue bloody or black, tarry breathing problems changes in vision chest pain chills confusion constipation cough diarrhea dizziness or feeling faint or lightheaded fast or irregular heartbeat fever flushing joint pain low blood counts - this medicine may decrease the number of white blood cells, red blood cells and platelets. You may be at increased risk for infections and bleeding. muscle pain muscle weakness pain, tingling, numbness in the hands or feet persistent headache redness, blistering, peeling or loosening of the skin, including inside the mouth signs and symptoms of high blood sugar such as dizziness; dry mouth; dry skin; fruity breath; nausea; stomach pain; increased hunger or thirst; increased urination signs and symptoms of kidney injury like trouble passing urine or change in the amount of urine signs and symptoms of liver injury like dark urine, light-colored stools, loss of appetite, nausea, right upper belly pain, yellowing of the eyes or skin sweating swollen lymph nodes weight loss Side effects that usually do  not require medical attention (report to your doctor or health care professional if they continue or are bothersome): decreased appetite hair loss tiredness This list may not describe all possible side effects. Call your doctor for medical advice about side effects. You may report side effects to FDA at 1-800-FDA-1088. Where should I keep my medication? This drug is given in a hospital or clinic and will not be stored at home. NOTE: This sheet is a summary. It may not cover all possible  information. If you have questions about this medicine, talk to your doctor, pharmacist, or health care provider.  2022 Elsevier/Gold Standard (2019-03-26 21:44:53)  Pemetrexed injection What is this medication? PEMETREXED (PEM e TREX ed) is a chemotherapy drug used to treat lung cancers like non-small cell lung cancer and mesothelioma. It may also be used to treat other cancers. This medicine may be used for other purposes; ask your health care provider or pharmacist if you have questions. COMMON BRAND NAME(S): Alimta, PEMFEXY What should I tell my care team before I take this medication? They need to know if you have any of these conditions: infection (especially a virus infection such as chickenpox, cold sores, or herpes) kidney disease low blood counts, like low white cell, platelet, or red cell counts lung or breathing disease, like asthma radiation therapy an unusual or allergic reaction to pemetrexed, other medicines, foods, dyes, or preservative pregnant or trying to get pregnant breast-feeding How should I use this medication? This drug is given as an infusion into a vein. It is administered in a hospital or clinic by a specially trained health care professional. Talk to your pediatrician regarding the use of this medicine in children. Special care may be needed. Overdosage: If you think you have taken too much of this medicine contact a poison control center or emergency room at once. NOTE: This medicine is only for you. Do not share this medicine with others. What if I miss a dose? It is important not to miss your dose. Call your doctor or health care professional if you are unable to keep an appointment. What may interact with this medication? This medicine may interact with the following medications: Ibuprofen This list may not describe all possible interactions. Give your health care provider a list of all the medicines, herbs, non-prescription drugs, or dietary supplements  you use. Also tell them if you smoke, drink alcohol, or use illegal drugs. Some items may interact with your medicine. What should I watch for while using this medication? Visit your doctor for checks on your progress. This drug may make you feel generally unwell. This is not uncommon, as chemotherapy can affect healthy cells as well as cancer cells. Report any side effects. Continue your course of treatment even though you feel ill unless your doctor tells you to stop. In some cases, you may be given additional medicines to help with side effects. Follow all directions for their use. Call your doctor or health care professional for advice if you get a fever, chills or sore throat, or other symptoms of a cold or flu. Do not treat yourself. This drug decreases your body's ability to fight infections. Try to avoid being around people who are sick. This medicine may increase your risk to bruise or bleed. Call your doctor or health care professional if you notice any unusual bleeding. Be careful brushing and flossing your teeth or using a toothpick because you may get an infection or bleed more easily. If you have any dental work  done, tell your dentist you are receiving this medicine. Avoid taking products that contain aspirin, acetaminophen, ibuprofen, naproxen, or ketoprofen unless instructed by your doctor. These medicines may hide a fever. Call your doctor or health care professional if you get diarrhea or mouth sores. Do not treat yourself. To protect your kidneys, drink water or other fluids as directed while you are taking this medicine. Do not become pregnant while taking this medicine or for 6 months after stopping it. Women should inform their doctor if they wish to become pregnant or think they might be pregnant. Men should not father a child while taking this medicine and for 3 months after stopping it. This may interfere with the ability to father a child. You should talk to your doctor or health  care professional if you are concerned about your fertility. There is a potential for serious side effects to an unborn child. Talk to your health care professional or pharmacist for more information. Do not breast-feed an infant while taking this medicine or for 1 week after stopping it. What side effects may I notice from receiving this medication? Side effects that you should report to your doctor or health care professional as soon as possible: allergic reactions like skin rash, itching or hives, swelling of the face, lips, or tongue breathing problems redness, blistering, peeling or loosening of the skin, including inside the mouth signs and symptoms of bleeding such as bloody or black, tarry stools; red or dark-brown urine; spitting up blood or brown material that looks like coffee grounds; red spots on the skin; unusual bruising or bleeding from the eye, gums, or nose signs and symptoms of infection like fever or chills; cough; sore throat; pain or trouble passing urine signs and symptoms of kidney injury like trouble passing urine or change in the amount of urine signs and symptoms of liver injury like dark yellow or brown urine; general ill feeling or flu-like symptoms; light-colored stools; loss of appetite; nausea; right upper belly pain; unusually weak or tired; yellowing of the eyes or skin Side effects that usually do not require medical attention (report to your doctor or health care professional if they continue or are bothersome): constipation mouth sores nausea, vomiting unusually weak or tired This list may not describe all possible side effects. Call your doctor for medical advice about side effects. You may report side effects to FDA at 1-800-FDA-1088. Where should I keep my medication? This drug is given in a hospital or clinic and will not be stored at home. NOTE: This sheet is a summary. It may not cover all possible information. If you have questions about this medicine,  talk to your doctor, pharmacist, or health care provider.  2022 Elsevier/Gold Standard (2017-06-13 16:11:33)

## 2021-02-08 DIAGNOSIS — K625 Hemorrhage of anus and rectum: Secondary | ICD-10-CM | POA: Diagnosis not present

## 2021-02-08 DIAGNOSIS — Z85048 Personal history of other malignant neoplasm of rectum, rectosigmoid junction, and anus: Secondary | ICD-10-CM | POA: Diagnosis not present

## 2021-02-08 DIAGNOSIS — Z1211 Encounter for screening for malignant neoplasm of colon: Secondary | ICD-10-CM | POA: Diagnosis not present

## 2021-02-10 LAB — ACID FAST CULTURE WITH REFLEXED SENSITIVITIES (MYCOBACTERIA): Acid Fast Culture: NEGATIVE

## 2021-02-10 LAB — FUNGAL ORGANISM REFLEX

## 2021-02-10 LAB — FUNGUS CULTURE RESULT

## 2021-02-10 LAB — FUNGUS CULTURE WITH STAIN

## 2021-02-14 ENCOUNTER — Ambulatory Visit (HOSPITAL_BASED_OUTPATIENT_CLINIC_OR_DEPARTMENT_OTHER): Payer: BC Managed Care – PPO

## 2021-02-16 ENCOUNTER — Ambulatory Visit: Payer: BC Managed Care – PPO

## 2021-02-16 ENCOUNTER — Ambulatory Visit: Payer: BC Managed Care – PPO | Admitting: Oncology

## 2021-02-16 ENCOUNTER — Other Ambulatory Visit: Payer: Self-pay

## 2021-02-16 ENCOUNTER — Ambulatory Visit
Admission: RE | Admit: 2021-02-16 | Discharge: 2021-02-16 | Disposition: A | Discharge status: Home / Self Care | Payer: BC Managed Care – PPO | Source: Ambulatory Visit | Attending: Obstetrics & Gynecology | Admitting: Obstetrics & Gynecology

## 2021-02-16 DIAGNOSIS — Z1231 Encounter for screening mammogram for malignant neoplasm of breast: Secondary | ICD-10-CM

## 2021-02-21 ENCOUNTER — Telehealth: Payer: Self-pay

## 2021-02-21 ENCOUNTER — Encounter: Payer: Self-pay | Admitting: Oncology

## 2021-02-21 DIAGNOSIS — C3412 Malignant neoplasm of upper lobe, left bronchus or lung: Secondary | ICD-10-CM

## 2021-02-21 NOTE — Telephone Encounter (Signed)
TC to Pt responding to My Chart message. Pt sent a image showing her port which looks infected. Pt states it feels warm and tender. Dr. Benay Spice saw image and requested Pt see MD who put the port in. TC to Tiffany in IR to schedule a visit for Pt to be seen.

## 2021-02-23 ENCOUNTER — Other Ambulatory Visit: Payer: Self-pay

## 2021-02-23 ENCOUNTER — Other Ambulatory Visit (HOSPITAL_COMMUNITY): Payer: Self-pay | Admitting: Diagnostic Radiology

## 2021-02-23 ENCOUNTER — Ambulatory Visit (HOSPITAL_COMMUNITY)
Admission: RE | Admit: 2021-02-23 | Discharge: 2021-02-23 | Disposition: A | Discharge status: Home / Self Care | Payer: BC Managed Care – PPO | Source: Ambulatory Visit | Attending: Oncology | Admitting: Oncology

## 2021-02-23 DIAGNOSIS — C3412 Malignant neoplasm of upper lobe, left bronchus or lung: Secondary | ICD-10-CM

## 2021-02-23 NOTE — Progress Notes (Signed)
Patient is s/p Port-a-cath placement on 01/12/21, she presents to [facility] IR today with her husband for possible skin infection over the [place] site.   She states that the skin over the port site has always been red ever since the port was placed; and she was evaluated by Dr. Benay Spice who put the patient on doxycycline which she finished with no relief. Patient states that she started having debilitating pain around the port site to the point that it wakes her up and she needs to take pain medicines. This morning, a small " bump" appeared around the incision scar.  Patient denies fever, chills, feeling sick.  Upon evaluation patient appears uncomfortable. Skin over the right upper chest port-a cath insertion site with erythema, warm and tender to palpation.  The skin induration with 2 small pustules noted over the port Port-A-Cath insertion site, measuring around 2 inches x 2 inches.    Patient was also evaluated by Dr. Anselm Pancoast, who recommends Port-A-Cath removal since the skin is not getting any better even after a course of antibiotic, causing patient significant pain, and now with pustules.  Patient was initially reluctant for Port-A-Cath removal, however, she ultimately agreed the Port-A-Cath removal.  Recommended a PICC placement for her chemo which is scheduled next Monday, the patient said that she is able to get her chemo through PIV and she does not want PICC line placed.  Patient is scheduled for port removal at [facility] IR tomorrow.  Dr. Benay Spice notified the plan.  Please call IR for questions and concerns regarding port removal.   Britt Theard H Kaelyn Nauta PA-C 02/23/2021 2:14 PM

## 2021-02-24 ENCOUNTER — Ambulatory Visit (HOSPITAL_COMMUNITY)
Admission: RE | Admit: 2021-02-24 | Discharge: 2021-02-24 | Disposition: A | Discharge status: Home / Self Care | Payer: BC Managed Care – PPO | Source: Ambulatory Visit | Attending: Oncology | Admitting: Oncology

## 2021-02-24 ENCOUNTER — Other Ambulatory Visit (HOSPITAL_COMMUNITY): Payer: Self-pay

## 2021-02-24 ENCOUNTER — Ambulatory Visit (HOSPITAL_COMMUNITY)
Admission: RE | Admit: 2021-02-24 | Discharge: 2021-02-24 | Disposition: A | Discharge status: Home / Self Care | Payer: BC Managed Care – PPO | Source: Ambulatory Visit | Attending: Diagnostic Radiology | Admitting: Diagnostic Radiology

## 2021-02-24 ENCOUNTER — Telehealth: Payer: Self-pay | Admitting: *Deleted

## 2021-02-24 ENCOUNTER — Other Ambulatory Visit (HOSPITAL_COMMUNITY): Payer: Self-pay | Admitting: Physician Assistant

## 2021-02-24 ENCOUNTER — Encounter (HOSPITAL_COMMUNITY): Payer: Self-pay

## 2021-02-24 DIAGNOSIS — Z87891 Personal history of nicotine dependence: Secondary | ICD-10-CM | POA: Diagnosis not present

## 2021-02-24 DIAGNOSIS — Z452 Encounter for adjustment and management of vascular access device: Secondary | ICD-10-CM | POA: Insufficient documentation

## 2021-02-24 DIAGNOSIS — Z79899 Other long term (current) drug therapy: Secondary | ICD-10-CM | POA: Insufficient documentation

## 2021-02-24 DIAGNOSIS — C3412 Malignant neoplasm of upper lobe, left bronchus or lung: Secondary | ICD-10-CM | POA: Diagnosis not present

## 2021-02-24 DIAGNOSIS — T827XXA Infection and inflammatory reaction due to other cardiac and vascular devices, implants and grafts, initial encounter: Secondary | ICD-10-CM | POA: Diagnosis not present

## 2021-02-24 HISTORY — PX: IR REMOVAL TUN ACCESS W/ PORT W/O FL MOD SED: IMG2290

## 2021-02-24 MED ORDER — MIDAZOLAM HCL 2 MG/2ML IJ SOLN
INTRAMUSCULAR | Status: AC
  Filled 2021-02-24: qty 4

## 2021-02-24 MED ORDER — FENTANYL CITRATE (PF) 100 MCG/2ML IJ SOLN
INTRAMUSCULAR | Status: AC | PRN
  Administered 2021-02-24: 50 ug via INTRAVENOUS

## 2021-02-24 MED ORDER — SODIUM CHLORIDE 0.9 % IV SOLN: INTRAVENOUS | Status: AC

## 2021-02-24 MED ORDER — FENTANYL CITRATE (PF) 100 MCG/2ML IJ SOLN
INTRAMUSCULAR | Status: AC
  Filled 2021-02-24: qty 2

## 2021-02-24 MED ORDER — LIDOCAINE-EPINEPHRINE 1 %-1:100000 IJ SOLN
INTRAMUSCULAR | Status: AC
  Filled 2021-02-24: qty 1

## 2021-02-24 MED ORDER — HEPARIN SOD (PORK) LOCK FLUSH 100 UNIT/ML IV SOLN
INTRAVENOUS | Status: AC
  Filled 2021-02-24: qty 5

## 2021-02-24 MED ORDER — DOXYCYCLINE HYCLATE 100 MG PO CAPS: 100.0000 mg | ORAL_CAPSULE | Freq: Two times a day (BID) | ORAL | 0 refills | Status: AC

## 2021-02-24 MED ORDER — LIDOCAINE-EPINEPHRINE 1 %-1:100000 IJ SOLN
INTRAMUSCULAR | Status: AC | PRN
  Administered 2021-02-24: 20 mL via INTRADERMAL

## 2021-02-24 MED ORDER — MIDAZOLAM HCL 2 MG/2ML IJ SOLN
INTRAMUSCULAR | Status: AC | PRN
  Administered 2021-02-24: 1 mg via INTRAVENOUS
  Administered 2021-02-24: 2 mg via INTRAVENOUS

## 2021-02-24 NOTE — Telephone Encounter (Signed)
Patient sent mychart message that she does not want a PICC line. Will go w/peripheral route.

## 2021-02-24 NOTE — Telephone Encounter (Signed)
Called patient and left VM asking if she wants to have next treatment on 10/24 peripheral or have PICC line placed. Requested return call. Left VM for [facility] IR: having port removed today. Requested she be put on antibiotic at Dr. Gearldine Shown request and to discuss PICC line w/her in case she would like to consider on. Order will be placed if she would like to pursue this.

## 2021-02-24 NOTE — Discharge Instructions (Signed)
Interventional radiology phone numbers [phone] After hours [phone]  Implanted Port Removal, Care After This sheet gives you information about how to care for yourself after your procedure. Your health care provider may also give you more specific instructions. If you have problems or questions, contact your health care provider. What can I expect after the procedure? After the procedure, it is common to have: Soreness or pain near your incision. Some swelling or bruising near your incision. Follow these instructions at home: Medicines Take over-the-counter and prescription medicines only as told by your health care provider. If you were prescribed an antibiotic medicine, take it as told by your health care provider. Do not stop taking the antibiotic even if you start to feel better. Bathing Do not take baths, swim, or use a hot tub until your health care provider approves. You may shower tomorrow. Remove your dressing prior to your shower. Incision care Follow instructions from your health care provider about how to take care of your incision. Make sure you: Wash your hands with soap and water before you change your bandage (dressing). If soap and water are not available, use hand sanitizer. Keep your dressing dry. Leave  skin glue in place. These skin closures may need to stay in place for 2 weeks or longer. . Check your incision area every day for signs of infection. Check for: More redness, swelling, or pain. More fluid or blood. Warmth. Pus or a bad smell.   Driving Do not drive for 24 hours if you were given a medicine to help you relax (sedative) during your procedure. If you did not receive a sedative, ask your health care provider when it is safe to drive.   Activity Return to your normal activities as told by your health care provider. Ask your health care provider what activities are safe for you. Do not lift anything that is heavier than 10 lb (4.5 kg), or the  limit that you are told, until your health care provider says that it is safe. Do not do activities that involve lifting your arms over your head. General instructions Do not use any products that contain nicotine or tobacco, such as cigarettes and e-cigarettes. These can delay healing. If you need help quitting, ask your health care provider. Keep all follow-up visits as told by your health care provider. This is important. Contact a health care provider if: You have more redness, swelling, or pain around your incision. You have more fluid or blood coming from your incision. Your incision feels warm to the touch. You have pus or a bad smell coming from your incision. You have pain that is not relieved by your pain medicine. Get help right away if you have: A fever or chills. Chest pain. Difficulty breathing. Summary After the procedure, it is common to have pain, soreness, swelling, or bruising near your incision. If you were prescribed an antibiotic medicine, take it as told by your health care provider. Do not stop taking the antibiotic even if you start to feel better. Do not drive for 24 hours if you were given a sedative during your procedure. Return to your normal activities as told by your health care provider. Ask your health care provider what activities are safe for you. This information is not intended to replace advice given to you by your health care provider. Make sure you discuss any questions you have with your health care provider. Document Revised: 06/07/2017 Document Reviewed: 06/07/2017 Elsevier Patient Education  2021 Elsevier Inc.       Moderate Conscious Sedation, Adult, Care After This sheet gives you information about how to care for yourself after your procedure. Your health care provider may also give you more specific instructions. If you have problems or questions, contact your health care provider. What can I expect after the procedure? After the procedure,  it is common to have: Sleepiness for several hours. Impaired judgment for several hours. Difficulty with balance. Vomiting if you eat too soon. Follow these instructions at home: For the time period you were told by your health care provider: Rest. Do not participate in activities where you could fall or become injured. Do not drive or use machinery. Do not drink alcohol. Do not take sleeping pills or medicines that cause drowsiness. Do not make important decisions or sign legal documents. Do not take care of children on your own.      Eating and drinking Follow the diet recommended by your health care provider. Drink enough fluid to keep your urine pale yellow. If you vomit: Drink water, juice, or soup when you can drink without vomiting. Make sure you have little or no nausea before eating solid foods.   General instructions Take over-the-counter and prescription medicines only as told by your health care provider. Have a responsible adult stay with you for the time you are told. It is important to have someone help care for you until you are awake and alert. Do not smoke. Keep all follow-up visits as told by your health care provider. This is important. Contact a health care provider if: You are still sleepy or having trouble with balance after 24 hours. You feel light-headed. You keep feeling nauseous or you keep vomiting. You develop a rash. You have a fever. You have redness or swelling around the IV site. Get help right away if: You have trouble breathing. You have new-onset confusion at home. Summary After the procedure, it is common to feel sleepy, have impaired judgment, or feel nauseous if you eat too soon. Rest after you get home. Know the things you should not do after the procedure. Follow the diet recommended by your health care provider and drink enough fluid to keep your urine pale yellow. Get help right away if you have trouble breathing or new-onset  confusion at home. This information is not intended to replace advice given to you by your health care provider. Make sure you discuss any questions you have with your health care provider. Document Revised: 08/22/2019 Document Reviewed: 03/20/2019 Elsevier Patient Education  2021 Elsevier Inc.  

## 2021-02-24 NOTE — Procedures (Signed)
Interventional Radiology Procedure Note  Procedure: Chest port removal  Indication: Port site infection  Findings: Please refer to procedural dictation for full description.  Complications: None  EBL: < 10 mL  Miachel Roux, MD [phone]

## 2021-02-24 NOTE — H&P (Addendum)
Chief Complaint: Patient was seen in consultation today for port removal  Referring Physician(s): Henn,Adam  Supervising Physician: Mir, Sharen Heck  Patient Status: [facility] - Out-pt  History of Present Illness: Gina Cervantes is a 60 y.o. female who had port placed in right chest on 01/12/21.  The skin has been persistently red since placement.  She has undergone a round of doxycycline without improvement.  Pain has become debilitating.  She is using Percocet for pain control.  She denies fever or chills.     Past Medical History:  Diagnosis Date   Anal cancer ([place]) 08/09/2012   Squamous cell   Anxiety    PANIC ATTACKS   Arthritis    Breast cancer ([place]) 2009   ER+PR+HER-2+   Ductal carcinoma ([place]) 02/2008   invasive    Heart palpitations    History of radiation therapy 09/02/12-10/10/12   anal 50.4Gy   History of shingles 10/2014   HPV (human papilloma virus) anogenital infection    vulvar/ freezing hx   Hx of radiation therapy 2020   Lung nodule    Malignant neoplasm of upper lobe of left lung ([place]) 06/2016   Personal history of chemotherapy    Personal history of radiation therapy 2010   Pneumonia    NO RECENT PROBLEMS   PONV (postoperative nausea and vomiting)    Hypotension   Radiation 10/21/08-12/08/08   Right breast 6240 cGy   Shingles 2017   Status post chemotherapy 02/2008   Taxol/Herceptin    Past Surgical History:  Procedure Laterality Date   BREAST BIOPSY Right 02/04/2008   malignant   BREAST LUMPECTOMY Right 2010   malignant   BREAST LUMPECTOMY WITH AXILLARY LYMPH NODE BIOPSY Right 5/10   Dr. Marlou Starks   BRONCHIAL BIOPSY  12/28/2020   Procedure: BRONCHIAL BIOPSIES;  Surgeon: Garner Nash, DO;  Location: [place] [facility];  Service: Pulmonary;;   BRONCHIAL BRUSHINGS  12/28/2020   Procedure: BRONCHIAL BRUSHINGS;  Surgeon: Garner Nash, DO;  Location: [place] [facility];  Service: Pulmonary;;   BRONCHIAL NEEDLE ASPIRATION BIOPSY  12/28/2020   Procedure: BRONCHIAL  NEEDLE ASPIRATION BIOPSIES;  Surgeon: Garner Nash, DO;  Location: [place] [facility];  Service: Pulmonary;;   BRONCHIAL WASHINGS  12/28/2020   Procedure: BRONCHIAL WASHINGS;  Surgeon: Garner Nash, DO;  Location: [place] [facility];  Service: Pulmonary;;   CESAREAN SECTION     COLONOSCOPY W/ POLYPECTOMY     ENDOBRONCHIAL ULTRASOUND  12/28/2020   Procedure: ENDOBRONCHIAL ULTRASOUND;  Surgeon: Garner Nash, DO;  Location: [place] [facility];  Service: Pulmonary;;   EVALUATION UNDER ANESTHESIA WITH ANAL FISTULECTOMY N/A 08/09/2012   Procedure: EXAM UNDER ANESTHESIA AND BIOPSY OF ANAL MASS;  Surgeon: Odis Hollingshead, MD;  Location: [facility];  Service: General;  Laterality: N/A;   IR IMAGING GUIDED PORT INSERTION  01/12/2021   KNEE ARTHROSCOPY Left    left knee   LUNG LOBECTOMY Left    PORTACATH PLACEMENT  03/2008   dr. Marlou Starks    REMOVAL PORTACATH  2011   VIDEO ASSISTED THORACOSCOPY (VATS)/WEDGE RESECTION Left 07/05/2016   Procedure: VIDEO ASSISTED THORACOSCOPY (VATS)/WEDGE RESECTION left upper lobe,  lymph node dissection and placement of OnQ catheter;  Surgeon: [name], MD;  Location: [place];  Service: Thoracic;  Laterality: Left;   VIDEO BRONCHOSCOPY N/A 07/05/2016   Procedure: VIDEO BRONCHOSCOPY;  Surgeon: [name], MD;  Location: [place];  Service: Thoracic;  Laterality: N/A;   VIDEO BRONCHOSCOPY N/A 05/03/2018   Procedure: VIDEO BRONCHOSCOPY;  Surgeon: [name], MD;  [facility];  Service: Thoracic;  Laterality: N/A;   VIDEO BRONCHOSCOPY WITH ENDOBRONCHIAL NAVIGATION N/A 05/03/2018   Procedure: VIDEO BRONCHOSCOPY WITH ENDOBRONCHIAL NAVIGATION WITH BIOPSY;  Surgeon: [name], MD;  Location: [place];  Service: Thoracic;  Laterality: N/A;   VIDEO BRONCHOSCOPY WITH ENDOBRONCHIAL NAVIGATION Bilateral 12/28/2020   Procedure: VIDEO BRONCHOSCOPY WITH ENDOBRONCHIAL NAVIGATION;  Surgeon: Garner Nash, DO;  Location: [place];  Service: Pulmonary;  Laterality: Bilateral;   ION   VIDEO BRONCHOSCOPY WITH ENDOBRONCHIAL ULTRASOUND N/A 05/03/2018   Procedure: VIDEO BRONCHOSCOPY WITH ENDOBRONCHIAL ULTRASOUND;  Surgeon: [name], MD;  Location: [place];  Service: Thoracic;  Laterality: N/A;   VIDEO BRONCHOSCOPY WITH RADIAL ENDOBRONCHIAL ULTRASOUND  12/28/2020   Procedure: RADIAL ENDOBRONCHIAL ULTRASOUND;  Surgeon: Garner Nash, DO;  Location: [place];  Service: Pulmonary;;    Allergies: No known allergies  Medications: Prior to Admission medications   Medication Sig Start Date End Date Taking? Authorizing Provider  albuterol (VENTOLIN HFA) 108 (90 Base) MCG/ACT inhaler TAKE 2 PUFFS BY MOUTH EVERY 6 HOURS AS NEEDED FOR WHEEZE OR SHORTNESS OF BREATH 02/07/21  Yes Ladell Pier, MD  Budeson-Glycopyrrol-Formoterol (BREZTRI AEROSPHERE) 160-9-4.8 MCG/ACT AERO Inhale 2 puffs into the lungs in the morning and at bedtime. 01/04/21  Yes Icard, Bradley L, DO  CALCIUM PO Take 1,200 mg by mouth daily. D3   Yes [provider]  Cyanocobalamin (VITAMIN B-12 PO) Take 2,500 mcg by mouth daily. Gummies-does not know dose   Yes [provider]  folic acid (FOLVITE) 1 MG tablet Take 1 tablet (1 mg total) by mouth daily. 02/07/21  Yes Ladell Pier, MD  LORazepam (ATIVAN) 0.5 MG tablet Take 1 tablet (0.5 mg total) by mouth every 8 (eight) hours as needed for anxiety (or nausea). 01/12/21  Yes Ladell Pier, MD  Multiple Vitamins-Minerals (MULTIVITAMIN WITH MINERALS) tablet Take 1 tablet by mouth daily.   Yes [provider]  Thiamine HCl (VITAMIN B-1 PO) Take 100 mg by mouth daily.   Yes [provider]  lidocaine-prilocaine (EMLA) cream Apply 1 application topically as needed. Apply 1/2 tablespoon to port 2 hours prior to stick and cover with plastic wrap. Begin 14 days after port placed. 01/03/21   Ladell Pier, MD  loratadine (CLARITIN) 10 MG tablet Take 10 mg by mouth as needed for allergies.    [provider]   POTASSIUM CHLORIDE PO Take 550 mg by mouth daily.    [provider]     Family History  Problem Relation Age of Onset   Lung cancer Father    Stroke Mother    Breast cancer Neg Hx     Social History   Socioeconomic History   Marital status: Married    Spouse name: Not on file   Number of children: Not on file   Years of education: Not on file   Highest education level: Not on file  Occupational History    Employer: [employer], [place], [place]    Comment: Realtor  Tobacco Use   Smoking status: Former    Years: 8.00    Types: Cigarettes    Quit date: 2015    Years since quitting: 7.8   Smokeless tobacco: Never   Tobacco comments:    stopped smoking cigarettes Jan. 2018  Vaping Use   Vaping Use: Never used  Substance and Sexual Activity   Alcohol use: Yes    Alcohol/week: 5.0 -  20.0 standard drinks    Types: 5 - 20 Glasses of wine per week    Comment: occasional   Drug use: No   Sexual activity: Yes    Partners: Male    Birth control/protection: Other-see comments, Post-menopausal    Comment: vasectomy  Other Topics Concern   Not on file  Social History Narrative   Married   Lost a son age 3 this year and husband's brother, still going through grieving process.   No family history of breast or ovarian cancer.   Employed as Patent examiner Strain: Not on file  Food Insecurity: Not on file  Transportation Needs: Not on file  Physical Activity: Not on file  Stress: Not on file  Social Connections: Not on file    Review of Systems: A 12 point ROS discussed and pertinent positives are indicated in the HPI above.  All other systems are negative.  Review of Systems  Constitutional:  Positive for activity change. Negative for chills, fatigue and fever.  HENT: Negative.    Respiratory: Negative.    Cardiovascular: Negative.   Gastrointestinal:  Positive for constipation.  Genitourinary: Negative.    Neurological: Negative.   Psychiatric/Behavioral: Negative.     Vital Signs: BP 118/77   Pulse (!) 102   Temp 98.5 F (36.9 C) (Oral)   Resp 16   LMP 04/07/2008   SpO2 96%   Physical Exam Constitutional:      Comments: Appearing uncomfortable  HENT:     Head: Normocephalic.     Nose: Nose normal.     Mouth/Throat:     Mouth: Mucous membranes are moist.     Pharynx: Oropharynx is clear.  Eyes:     Extraocular Movements: Extraocular movements intact.     Conjunctiva/sclera: Conjunctivae normal.  Cardiovascular:     Rate and Rhythm: Normal rate and regular rhythm.     Pulses: Normal pulses.     Heart sounds: Normal heart sounds.  Pulmonary:     Effort: Pulmonary effort is normal.     Breath sounds: Normal breath sounds.  Abdominal:     General: Abdomen is flat.     Palpations: Abdomen is soft.  Musculoskeletal:        General: Normal range of motion.     Cervical back: Neck supple.  Skin:    General: Skin is warm and dry.     Capillary Refill: Capillary refill takes less than 2 seconds.     Comments: Redness and induration around insertion site approximately 5cm x 6cm  Neurological:     General: No focal deficit present.     Mental Status: She is alert.  Psychiatric:     Comments: antsy    Imaging: MM 3D SCREEN BREAST BILATERAL  Result Date: 02/22/2021 CLINICAL DATA:  Screening. EXAM: DIGITAL SCREENING BILATERAL MAMMOGRAM WITH TOMOSYNTHESIS AND CAD TECHNIQUE: Bilateral screening digital craniocaudal and mediolateral oblique mammograms were obtained. Bilateral screening digital breast tomosynthesis was performed. The images were evaluated with computer-aided detection. COMPARISON:  Previous exam(s). ACR Breast Density Category b: There are scattered areas of fibroglandular density. FINDINGS: There are no findings suspicious for malignancy. IMPRESSION: No mammographic evidence of malignancy. A result letter of this screening mammogram will be mailed directly to the  patient. RECOMMENDATION: Screening mammogram in one year. (Code:SM-B-01Y) BI-RADS CATEGORY  1: Negative. Electronically Signed   By: Ammie Ferrier M.D.   On: 02/22/2021 07:53    Labs:  CBC:  Recent Labs    10/18/20 1017 11/16/20 1127 01/19/21 1205 02/07/21 1055  WBC 5.0 5.0 5.0 5.7  HGB 14.9 15.6* 14.7 12.8  HCT 45.5 47.1* 43.6 38.5  PLT 264 178 192 303    COAGS: No results for input(s): INR, APTT in the last 8760 hours.  BMP: Recent Labs    10/18/20 1017 11/16/20 1127 01/19/21 1205 02/07/21 1055  NA 136 136 138 138  K 4.4 5.0 4.5 4.4  CL 94* 94* 96* 98  CO2 33* 35* 32 33*  GLUCOSE 106* 103* 94 95  BUN '14 17 19 17  ' CALCIUM 8.9 9.3 9.6 9.4  CREATININE 0.86 0.83 0.78 0.75  GFRNONAA >60 >60 >60 >60    LIVER FUNCTION TESTS: Recent Labs    10/18/20 1017 11/16/20 1127 01/19/21 1205 02/07/21 1055  BILITOT 0.6 0.6 0.5 0.6  AST '21 19 18 20  ' ALT '16 16 15 14  ' ALKPHOS 62 57 61 67  PROT 7.4 7.3 7.5 7.3  ALBUMIN 3.8 3.9 4.1 4.0    Assessment and Plan:  Inflammation of port --Pt NPO, has ride home, someone to stay at home with her, is not on blood thinners, and is in agreement of removal --OK to proceed with port removal.  Risks and benefits of tunneled catheter removal were discussed with the patient including bleeding, infection, damage to adjacent structures, malfunction of the catheter with need for additional procedures.  All of the patient's questions were answered, patient is agreeable to proceed. Consent signed and in chart.    Thank you for this interesting consult.  I greatly enjoyed meeting Gina Cervantes and look forward to participating in their care.  A copy of this report was sent to the requesting provider on this date.  Electronically Signed: Pasty Spillers, PA 02/24/2021, 11:49 AM   I spent a total of 25 Minutes in face to face in clinical consultation, greater than 50% of which was counseling/coordinating care for port-a-cath  removal

## 2021-02-27 ENCOUNTER — Other Ambulatory Visit: Payer: Self-pay | Admitting: Oncology

## 2021-02-28 ENCOUNTER — Inpatient Hospital Stay (HOSPITAL_BASED_OUTPATIENT_CLINIC_OR_DEPARTMENT_OTHER): Payer: BC Managed Care – PPO | Admitting: Nurse Practitioner

## 2021-02-28 ENCOUNTER — Inpatient Hospital Stay: Payer: BC Managed Care – PPO

## 2021-02-28 ENCOUNTER — Other Ambulatory Visit: Payer: Self-pay

## 2021-02-28 ENCOUNTER — Encounter: Payer: Self-pay | Admitting: Nurse Practitioner

## 2021-02-28 VITALS — BP 128/65 | HR 90 | Temp 98.0°F | Resp 18 | Ht 65.0 in | Wt 164.2 lb

## 2021-02-28 DIAGNOSIS — C3412 Malignant neoplasm of upper lobe, left bronchus or lung: Secondary | ICD-10-CM

## 2021-02-28 DIAGNOSIS — C3411 Malignant neoplasm of upper lobe, right bronchus or lung: Secondary | ICD-10-CM | POA: Diagnosis not present

## 2021-02-28 DIAGNOSIS — Z5111 Encounter for antineoplastic chemotherapy: Secondary | ICD-10-CM | POA: Diagnosis not present

## 2021-02-28 DIAGNOSIS — Z170 Estrogen receptor positive status [ER+]: Secondary | ICD-10-CM | POA: Diagnosis not present

## 2021-02-28 DIAGNOSIS — Z923 Personal history of irradiation: Secondary | ICD-10-CM | POA: Diagnosis not present

## 2021-02-28 DIAGNOSIS — D6481 Anemia due to antineoplastic chemotherapy: Secondary | ICD-10-CM | POA: Diagnosis not present

## 2021-02-28 DIAGNOSIS — C3490 Malignant neoplasm of unspecified part of unspecified bronchus or lung: Secondary | ICD-10-CM

## 2021-02-28 DIAGNOSIS — C3431 Malignant neoplasm of lower lobe, right bronchus or lung: Secondary | ICD-10-CM | POA: Diagnosis not present

## 2021-02-28 DIAGNOSIS — Z853 Personal history of malignant neoplasm of breast: Secondary | ICD-10-CM | POA: Diagnosis not present

## 2021-02-28 DIAGNOSIS — Z85048 Personal history of other malignant neoplasm of rectum, rectosigmoid junction, and anus: Secondary | ICD-10-CM | POA: Diagnosis not present

## 2021-02-28 DIAGNOSIS — Z79899 Other long term (current) drug therapy: Secondary | ICD-10-CM | POA: Diagnosis not present

## 2021-02-28 LAB — CMP (CANCER CENTER ONLY)
ALT: 21 U/L (ref 0–44)
AST: 24 U/L (ref 15–41)
Albumin: 4 g/dL (ref 3.5–5.0)
Alkaline Phosphatase: 66 U/L (ref 38–126)
Anion gap: 10 (ref 5–15)
BUN: 20 mg/dL (ref 6–20)
CO2: 30 mmol/L (ref 22–32)
Calcium: 10.1 mg/dL (ref 8.9–10.3)
Chloride: 97 mmol/L — ABNORMAL LOW (ref 98–111)
Creatinine: 0.93 mg/dL (ref 0.44–1.00)
GFR, Estimated: >60 mL/min (ref >60)
Glucose, Bld: 103 mg/dL — ABNORMAL HIGH (ref 70–99)
Potassium: 4.5 mmol/L (ref 3.5–5.1)
Sodium: 137 mmol/L (ref 135–145)
Total Bilirubin: 0.6 mg/dL (ref 0.3–1.2)
Total Protein: 7.7 g/dL (ref 6.5–8.1)

## 2021-02-28 LAB — CBC WITH DIFFERENTIAL (CANCER CENTER ONLY)
Abs Immature Granulocytes: 0.04 10*3/uL (ref 0.00–0.07)
Basophils Absolute: 0.1 10*3/uL (ref 0.0–0.1)
Basophils Relative: 1 %
Eosinophils Absolute: 0.2 10*3/uL (ref 0.0–0.5)
Eosinophils Relative: 3 %
HCT: 38 % (ref 36.0–46.0)
Hemoglobin: 12.9 g/dL (ref 12.0–15.0)
Immature Granulocytes: 1 %
Lymphocytes Relative: 17 %
Lymphs Abs: 1 10*3/uL (ref 0.7–4.0)
MCH: 36 pg — ABNORMAL HIGH (ref 26.0–34.0)
MCHC: 33.9 g/dL (ref 30.0–36.0)
MCV: 106.1 fL — ABNORMAL HIGH (ref 80.0–100.0)
Monocytes Absolute: 0.9 10*3/uL (ref 0.1–1.0)
Monocytes Relative: 15 %
Neutro Abs: 3.8 10*3/uL (ref 1.7–7.7)
Neutrophils Relative %: 63 %
Platelet Count: 364 10*3/uL (ref 150–400)
RBC: 3.58 MIL/uL — ABNORMAL LOW (ref 3.87–5.11)
RDW: 16.2 % — ABNORMAL HIGH (ref 11.5–15.5)
WBC Count: 6.1 10*3/uL (ref 4.0–10.5)
nRBC: 0 % (ref 0.0–0.2)

## 2021-02-28 MED ORDER — SODIUM CHLORIDE 0.9 % IV SOLN
500.0000 mg/m2 | Freq: Once | INTRAVENOUS | Status: AC
  Administered 2021-02-28: 900 mg via INTRAVENOUS
  Filled 2021-02-28: qty 20

## 2021-02-28 MED ORDER — PROCHLORPERAZINE MALEATE 10 MG PO TABS
10.0000 mg | ORAL_TABLET | Freq: Once | ORAL | Status: AC
  Administered 2021-02-28: 10 mg via ORAL
  Filled 2021-02-28: qty 1

## 2021-02-28 MED ORDER — CYANOCOBALAMIN 1000 MCG/ML IJ SOLN
1000.0000 ug | Freq: Once | INTRAMUSCULAR | Status: AC
  Administered 2021-02-28: 1000 ug via INTRAMUSCULAR
  Filled 2021-02-28: qty 1

## 2021-02-28 MED ORDER — HEPARIN SOD (PORK) LOCK FLUSH 100 UNIT/ML IV SOLN: 500.0000 [IU] | Freq: Once | INTRAVENOUS | Status: DC | PRN

## 2021-02-28 MED ORDER — SODIUM CHLORIDE 0.9 % IV SOLN: Freq: Once | INTRAVENOUS | Status: AC

## 2021-02-28 MED ORDER — SODIUM CHLORIDE 0.9% FLUSH: 10.0000 mL | INTRAVENOUS | Status: DC | PRN

## 2021-02-28 MED ORDER — SODIUM CHLORIDE 0.9 % IV SOLN
200.0000 mg | Freq: Once | INTRAVENOUS | Status: AC
  Administered 2021-02-28: 200 mg via INTRAVENOUS
  Filled 2021-02-28: qty 8

## 2021-02-28 NOTE — Progress Notes (Signed)
Patient presents for treatment. RN assessment completed along with the following:  Labs/vitals reviewed - Yes, and within treatment parameters.   Weight within 10% of previous measurement - Yes Oncology Treatment Attestation completed for current therapy- Yes, on date 09/02/20 Informed consent completed and reflects current therapy/intent - Yes, on date 09/26/20             Provider progress note reviewed - Today's provider note is not yet available. I reviewed the most recent oncology provider progress note in chart dated 02/07/21. Treatment/Antibody/Supportive plan reviewed - Yes, and there are no adjustments needed for today's treatment. S&H and other orders reviewed - Yes, and there are no additional orders identified. Previous treatment date reviewed - Yes, and the appropriate amount of time has elapsed between treatments. Clinic Hand Off Received from - Bevely Palmer, LPN  Patient to proceed with treatment.

## 2021-02-28 NOTE — Progress Notes (Signed)
Gina Cervantes OFFICE PROGRESS NOTE   Diagnosis: Non-small cell lung cancer  INTERVAL HISTORY:   Gina Cervantes  returns as scheduled.  She completed another cycle of Alimta/Pembrolizumab 02/07/2021.  Port-A-Cath removed 02/24/2021.  There was no evidence of infection within the port pocket.  She is completing a cycle of doxycycline.  The port feels better.  She notes less surrounding firmness.  No fever.  No nausea, vomiting, diarrhea.  Objective:  Vital signs in last 24 hours:  Blood pressure 128/65, pulse 90, temperature 98 F (36.7 C), temperature source Oral, resp. rate 18, height _0  (1.651 m), weight 164 lb 3.2 oz (74.5 kg), last menstrual period 04/07/2008, SpO2 100 %.    HEENT: No thrush or ulcers. Resp: Lungs clear bilaterally. Cardio: Regular rate and rhythm. GI: Abdomen soft and nontender.  No hepatomegaly. Vascular: No leg edema. Skin: Port site with erythema, induration; crusty drainage from a site inferior to the Port-A-Cath scar.   Lab Results:  Lab Results  Component Value Date   WBC 6.1 02/28/2021   HGB 12.9 02/28/2021   HCT 38.0 02/28/2021   MCV 106.1 (H) 02/28/2021   PLT 364 02/28/2021   NEUTROABS 3.8 02/28/2021    Imaging:  No results found.  Medications: I have reviewed the patient's current medications.  Assessment/Plan: Anal cancer-left anal margin/anal canal mass, status post a biopsy on 08/09/2012 confirming invasive moderately differentiated squamous cell carcinoma,p16 positive.   -Staging PET scan 08/20/2012-hypermetabolic anal mass, no hypermetabolic metastases   -Initiation of concurrent radiation with cycle 1 of mitomycin C./5-fluorouracil 09/02/2012.   -She completed cycle 2 5-fluorouracil/mitomycin C. beginning 10/01/2012.   -She completed radiation 10/10/2012.   2. Right breast cancer 2009, ER positive, PR positive, HER-2 positive. She is status post neoadjuvant AC x4 cycles followed by Taxol/Herceptin. She underwent a  right lumpectomy and sentinel lymph node biopsy 09/22/2008 with no evidence of residual invasive carcinoma and 5 negative sentinel lymph nodes. She then completed right breast radiation. She began tamoxifen 12/15/2008 and completed adjuvant Herceptin 05/25/2009. The tamoxifen was discontinued and she was switched to Arimidex in July 2013.  Arimidex discontinued in mid July 2019. 3. History of enlargement of the right tonsil.   4. Nonspecific pulmonary nodules without hypermetabolic activity on the PET scan 08/20/2012. Chest CT 10/21/2012 with scattered pulmonary nodules including nodules that were not present in 2010. Annual surveillance was recommended. Follow-up chest CT 10/24/2013 with scattered groundglass densities again noted in both lungs mostly unchanged when compared to the previous study. A sub-solid right lower lobe nodule had increased central density. The size was unchanged.The nodule has been present since 2009. 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 No EGFR, ALK, BRAF, RET, ERBB2, or ROS1 alteration CT chest 11/14/2016-status post left upper lobe wedge resection, stable right lung nodules Chest CT 05/02/2017-stable bilateral solid and groundglass nodules, stable  mild mediastinal  lymphadenopathy CT chest 12/13/2017- enlargement of right upper lobe nodule, other lung nodules and chest lymph node stable PET 01/09/2018- right upper lobe nodule and right lower lobe nodule have enlarged since 2018 and have low level metabolic activity increased size of an 8 mm right upper lobe nodule without increased metabolic activity, stable activity and a mildly enlarged lower paratracheal node, no evidence of metastatic disease in the abdomen or pelvis CT chest 04/22/2018- no new lung nodules, dominant right lung nodules slightly increased in size Bronchoscopy/EBUS 05/04/2019 with biopsy of level 7 node, biopsy of right lower lobe nodule, FNA of right lower lobe nodule-negative CT biopsy of right upper lobe nodule 06/07/2018- adenocarcinoma with lepidic and acinar patterns SBRT to the dominant right upper lobe nodule 07/23/2018-07/30/2018 CT 09/16/2018-slight increase in size of medial right upper lobe nodule-possibly secondary to interval radiation, other nodules are unchanged CT 12/25/2018- medial right upper lobe nodule has decreased in size slightly, multiple additional solid and groundglass nodules are stable CT 06/09/2019-enlarging right lower lobe nodule, enlarging subsolid nodule, new right upper lobe nodule SBRT to right lower lobe lung nodule, 3 fractions 07/08/2019-07/14/2019 CT 12/05/2019-dominant right lower lobe lesion stable to slightly decreased in size, progressive solid component associated with a dominant right upper lung nodule, mild progression of a left upper lobe nodule, new subsolid anterior medial left upper lobe nodule Cycle 1 Alimta/carboplatin/pembrolizumab 02/04/2020 Cycle 2 Alimta/carboplatin/pembrolizumab 02/23/2020 Cycle 3 Alimta/carboplatin/pembrolizumab 03/15/2020 Cycle 4 Alimta/carboplatin/pembrolizumab 04/05/2020 (Alimta and carboplatin dose reduced secondary to anemia) CT chest 04/12/2020-decreased size of solid right lower lobe and right upper lobe nodules, groundglass  nodules in the left lung unchanged Cycle 5 Alimta/pembrolizumab 05/03/2020 Cycle 6 Alimta/pembrolizumab 05/21/2020 Cycle 7 Alimta/pembrolizumab 06/14/2020 Cycle 8 Alimta/pembrolizumab 07/05/2020 CT chest 07/22/2020-stable nodules, no evidence of disease progression, unchanged multifocal groundglass nodules suspicious for multifocal adenocarcinoma. Cycle 9 Alimta/Pembrolizumab 07/26/2020 Cycle 10 Alimta/Pembrolizumab 08/16/2020 Cycle 11 Alimta/Pembrolizumab 09/06/2020 Cycle 12 Alimta/pembrolizumab 09/27/2020 Cycle 13 Alimta/pembrolizumab 10/18/2020 CT chest 11/12/2020-enlarging soft tissue density right upper lobe medially adjacent to the mediastinum measuring 2.7 x 2.1 cm, concerning for recurrent tumor.  Stable radiation changes involving the right upper lobe and right lower lobe.  Stable scattered small solid and subsolid pulmonary nodules bilaterally.  New right-sided pleural effusion.  Stable borderline enlarged mediastinal lymph nodes. Bronchoscopy 12/28/2020-right upper lobe and left lower lobe lesions biopsy-negative pathology PET 12/24/2991-ZJI metabolic activity involving the right suprahilar consolidative process-favored benign, measured smaller, additional groundglass densities with very low-level metabolic activity, radiation change in the right lower lobe, resolution of right pleural effusion, no distant metastatic disease Cycle 14 Alimta/pembrolizumab 01/19/2021 Cycle 15 Alimta/pembrolizumab 02/07/2021 5.  Baker's cyst left leg, pain and swelling in the left calf, negative Doppler 01/24/2020 and 01/29/2020-evaluated by orthopedics and underwent aspiration of the cyst 01/29/2000, completed a course of Keflex 6.  Anemia secondary to chemotherapy, status post 2 units of packed red blood cells on 04/10/2020-improved 7.  Port-A-Cath placement 01/12/2021; Port-A-Cath removal 02/24/2021 due to infection      Disposition: Gina Cervantes appears stable.  She completed a cycle of Alimta/Pembrolizumab 02/07/2021.  She  was seen by Interventional Radiology 02/23/2021.  Port removal recommended due to concern for infection.  The Port-A-Cath was removed 02/24/2021.  She is completing a course of doxycycline, notes improvement since the port was removed.  Plan to proceed with Alimta/Pembrolizumab today as scheduled.  CBC and chemistry panel reviewed.  Labs adequate to proceed as above.  She will return for lab, follow-up, Alimta/Pembrolizumab 03/28/2021.  She will contact  the office in the interim with any problems.  Patient seen with Dr. Benay Spice.    Ned Card ANP/GNP-BC   02/28/2021  10:56 AM This was a shared visit with Ned Card.  Gina Cervantes was interviewed and examined.  The infected Port-A-Cath was removed last week.  There is persistent erythema and induration surrounding the Port-A-Cath site, but this has improved.  She will complete the course of doxycycline.  She will contact us for progressive erythema at the Port-A-Cath site or new symptoms.  She will continue Alimta/pembrolizumab with peripheral IV access.  I was present for greater than 50% of today's visit.  I performed medical decision making.  Julieanne Manson, MD

## 2021-02-28 NOTE — Patient Instructions (Signed)
[facility]  Discharge Instructions: Thank you for choosing [facility] to provide your oncology and hematology care.   If you have a lab appointment with the [facility], please go directly to the [facility] and check in at the registration area.   Wear comfortable clothing and clothing appropriate for easy access to any Portacath or PICC line.   We strive to give you quality time with your provider. You may need to reschedule your appointment if you arrive late (15 or more minutes).  Arriving late affects you and other patients whose appointments are after yours.  Also, if you miss three or more appointments without notifying the office, you may be dismissed from the clinic at the provider's discretion.      For prescription refill requests, have your pharmacy contact our office and allow 72 hours for refills to be completed.    Today you received the following chemotherapy and/or immunotherapy agents Keytruda, Pemetrexed      To help prevent nausea and vomiting after your treatment, we encourage you to take your nausea medication as directed.  BELOW ARE SYMPTOMS THAT SHOULD BE REPORTED IMMEDIATELY: *FEVER GREATER THAN 100.4 F (38 C) OR HIGHER *CHILLS OR SWEATING *NAUSEA AND VOMITING THAT IS NOT CONTROLLED WITH YOUR NAUSEA MEDICATION *UNUSUAL SHORTNESS OF BREATH *UNUSUAL BRUISING OR BLEEDING *URINARY PROBLEMS (pain or burning when urinating, or frequent urination) *BOWEL PROBLEMS (unusual diarrhea, constipation, pain near the anus) TENDERNESS IN MOUTH AND THROAT WITH OR WITHOUT PRESENCE OF ULCERS (sore throat, sores in mouth, or a toothache) UNUSUAL RASH, SWELLING OR PAIN  UNUSUAL VAGINAL DISCHARGE OR ITCHING   Items with * indicate a potential emergency and should be followed up as soon as possible or go to the Emergency Department if any problems should occur.  Please show the CHEMOTHERAPY ALERT CARD or IMMUNOTHERAPY ALERT CARD at  check-in to the Emergency Department and triage nurse.  Should you have questions after your visit or need to cancel or reschedule your appointment, please contact [facility]  Dept: [phone]  and follow the prompts.  Office hours are 8:00 a.m. to 4:30 p.m. Monday - Friday. Please note that voicemails left after 4:00 p.m. may not be returned until the following business day.  We are closed weekends and major holidays. You have access to a nurse at all times for urgent questions. Please call the main number to the clinic Dept: [phone] and follow the prompts.   For any non-urgent questions, you may also contact your provider using MyChart. We now offer e-Visits for anyone 65 and older to request care online for non-urgent symptoms. For details visit [email or web address].   Also download the MyChart app! Go to the app store, search "MyChart", open the app, select [facility], and log in with your MyChart username and password.  Due to Covid, a mask is required upon entering the hospital/clinic. If you do not have a mask, one will be given to you upon arrival. For doctor visits, patients may have 1 support person aged 70 or older with them. For treatment visits, patients cannot have anyone with them due to current Covid guidelines and our immunocompromised population.   Pembrolizumab injection What is this medication? PEMBROLIZUMAB (pem broe liz ue mab) is a monoclonal antibody. It is used to treat certain types of cancer. This medicine may be used for other purposes; ask your health care provider or pharmacist if you have questions. COMMON BRAND NAME(S): [employer]  What should I tell my care team before I take this medication? They need to know if you have any of these conditions: autoimmune diseases like Crohn's disease, ulcerative colitis, or lupus have had or planning to have an allogeneic stem cell transplant (uses someone else's stem cells) history of organ  transplant history of chest radiation nervous system problems like myasthenia gravis or Guillain-Barre syndrome an unusual or allergic reaction to pembrolizumab, other medicines, foods, dyes, or preservatives pregnant or trying to get pregnant breast-feeding How should I use this medication? This medicine is for infusion into a vein. It is given by a health care professional in a hospital or clinic setting. A special MedGuide will be given to you before each treatment. Be sure to read this information carefully each time. Talk to your pediatrician regarding the use of this medicine in children. While this drug may be prescribed for children as young as 6 months for selected conditions, precautions do apply. Overdosage: If you think you have taken too much of this medicine contact a poison control center or emergency room at once. NOTE: This medicine is only for you. Do not share this medicine with others. What if I miss a dose? It is important not to miss your dose. Call your doctor or health care professional if you are unable to keep an appointment. What may interact with this medication? Interactions have not been studied. This list may not describe all possible interactions. Give your health care provider a list of all the medicines, herbs, non-prescription drugs, or dietary supplements you use. Also tell them if you smoke, drink alcohol, or use illegal drugs. Some items may interact with your medicine. What should I watch for while using this medication? Your condition will be monitored carefully while you are receiving this medicine. You may need blood work done while you are taking this medicine. Do not become pregnant while taking this medicine or for 4 months after stopping it. Women should inform their doctor if they wish to become pregnant or think they might be pregnant. There is a potential for serious side effects to an unborn child. Talk to your health care professional or  pharmacist for more information. Do not breast-feed an infant while taking this medicine or for 4 months after the last dose. What side effects may I notice from receiving this medication? Side effects that you should report to your doctor or health care professional as soon as possible: allergic reactions like skin rash, itching or hives, swelling of the face, lips, or tongue bloody or black, tarry breathing problems changes in vision chest pain chills confusion constipation cough diarrhea dizziness or feeling faint or lightheaded fast or irregular heartbeat fever flushing joint pain low blood counts - this medicine may decrease the number of white blood cells, red blood cells and platelets. You may be at increased risk for infections and bleeding. muscle pain muscle weakness pain, tingling, numbness in the hands or feet persistent headache redness, blistering, peeling or loosening of the skin, including inside the mouth signs and symptoms of high blood sugar such as dizziness; dry mouth; dry skin; fruity breath; nausea; stomach pain; increased hunger or thirst; increased urination signs and symptoms of kidney injury like trouble passing urine or change in the amount of urine signs and symptoms of liver injury like dark urine, light-colored stools, loss of appetite, nausea, right upper belly pain, yellowing of the eyes or skin sweating swollen lymph nodes weight loss Side effects that usually do not require  medical attention (report to your doctor or health care professional if they continue or are bothersome): decreased appetite hair loss tiredness This list may not describe all possible side effects. Call your doctor for medical advice about side effects. You may report side effects to FDA at 1-800-FDA-1088. Where should I keep my medication? This drug is given in a hospital or clinic and will not be stored at home. NOTE: This sheet is a summary. It may not cover all possible  information. If you have questions about this medicine, talk to your doctor, pharmacist, or health care provider.  2022 Elsevier/Gold Standard (2019-03-26 21:44:53)  Pemetrexed injection What is this medication? PEMETREXED (PEM e TREX ed) is a chemotherapy drug used to treat lung cancers like non-small cell lung cancer and mesothelioma. It may also be used to treat other cancers. This medicine may be used for other purposes; ask your health care provider or pharmacist if you have questions. COMMON BRAND NAME(S): [employer]im[employer], PEMFEXY What should I tell my care team before I take this medication? They need to know if you have any of these conditions: infection (especially a virus infection such as chickenpox, cold sores, or herpes) kidney disease low blood counts, like low white cell, platelet, or red cell counts lung or breathing disease, like asthma radiation therapy an unusual or allergic reaction to pemetrexed, other medicines, foods, dyes, or preservative pregnant or trying to get pregnant breast-feeding How should I use this medication? This drug is given as an infusion into a vein. It is administered in a hospital or clinic by a specially trained health care professional. Talk to your pediatrician regarding the use of this medicine in children. Special care may be needed. Overdosage: If you think you have taken too much of this medicine contact a poison control center or emergency room at once. NOTE: This medicine is only for you. Do not share this medicine with others. What if I miss a dose? It is important not to miss your dose. Call your doctor or health care professional if you are unable to keep an appointment. What may interact with this medication? This medicine may interact with the following medications: Ibuprofen This list may not describe all possible interactions. Give your health care provider a list of all the medicines, herbs, non-prescription drugs, or dietary supplements  you use. Also tell them if you smoke, drink alcohol, or use illegal drugs. Some items may interact with your medicine. What should I watch for while using this medication? Visit your doctor for checks on your progress. This drug may make you feel generally unwell. This is not uncommon, as chemotherapy can affect healthy cells as well as cancer cells. Report any side effects. Continue your course of treatment even though you feel ill unless your doctor tells you to stop. In some cases, you may be given additional medicines to help with side effects. Follow all directions for their use. Call your doctor or health care professional for advice if you get a fever, chills or sore throat, or other symptoms of a cold or flu. Do not treat yourself. This drug decreases your body's ability to fight infections. Try to avoid being around people who are sick. This medicine may increase your risk to bruise or bleed. Call your doctor or health care professional if you notice any unusual bleeding. Be careful brushing and flossing your teeth or using a toothpick because you may get an infection or bleed more easily. If you have any dental work done, tell  your dentist you are receiving this medicine. Avoid taking products that contain aspirin, acetaminophen, ibuprofen, naproxen, or ketoprofen unless instructed by your doctor. These medicines may hide a fever. Call your doctor or health care professional if you get diarrhea or mouth sores. Do not treat yourself. To protect your kidneys, drink water or other fluids as directed while you are taking this medicine. Do not become pregnant while taking this medicine or for 6 months after stopping it. Women should inform their doctor if they wish to become pregnant or think they might be pregnant. Men should not father a child while taking this medicine and for 3 months after stopping it. This may interfere with the ability to father a child. You should talk to your doctor or health  care professional if you are concerned about your fertility. There is a potential for serious side effects to an unborn child. Talk to your health care professional or pharmacist for more information. Do not breast-feed an infant while taking this medicine or for 1 week after stopping it. What side effects may I notice from receiving this medication? Side effects that you should report to your doctor or health care professional as soon as possible: allergic reactions like skin rash, itching or hives, swelling of the face, lips, or tongue breathing problems redness, blistering, peeling or loosening of the skin, including inside the mouth signs and symptoms of bleeding such as bloody or black, tarry stools; red or dark-brown urine; spitting up blood or brown material that looks like coffee grounds; red spots on the skin; unusual bruising or bleeding from the eye, gums, or nose signs and symptoms of infection like fever or chills; cough; sore throat; pain or trouble passing urine signs and symptoms of kidney injury like trouble passing urine or change in the amount of urine signs and symptoms of liver injury like dark yellow or brown urine; general ill feeling or flu-like symptoms; light-colored stools; loss of appetite; nausea; right upper belly pain; unusually weak or tired; yellowing of the eyes or skin Side effects that usually do not require medical attention (report to your doctor or health care professional if they continue or are bothersome): constipation mouth sores nausea, vomiting unusually weak or tired This list may not describe all possible side effects. Call your doctor for medical advice about side effects. You may report side effects to FDA at 1-800-FDA-1088. Where should I keep my medication? This drug is given in a hospital or clinic and will not be stored at home. NOTE: This sheet is a summary. It may not cover all possible information. If you have questions about this medicine,  talk to your doctor, pharmacist, or health care provider.  2022 Elsevier/Gold Standard (2017-06-13 16:11:33)

## 2021-02-28 NOTE — Progress Notes (Signed)
Patient seen by Lisa Thomas NP today  Vitals are within treatment parameters.  Labs reviewed by Lisa Thomas NP and are within treatment parameters.  Per physician team, patient is ready for treatment and there are NO modifications to the treatment plan.     

## 2021-03-02 ENCOUNTER — Encounter: Payer: Self-pay | Admitting: Nurse Practitioner

## 2021-03-02 ENCOUNTER — Other Ambulatory Visit: Payer: Self-pay | Admitting: Nurse Practitioner

## 2021-03-02 DIAGNOSIS — C3412 Malignant neoplasm of upper lobe, left bronchus or lung: Secondary | ICD-10-CM

## 2021-03-02 MED ORDER — FOLIC ACID 1 MG PO TABS: 1.0000 mg | ORAL_TABLET | Freq: Every day | ORAL | 1 refills | Status: DC

## 2021-03-11 ENCOUNTER — Other Ambulatory Visit (HOSPITAL_COMMUNITY): Payer: Self-pay | Admitting: Interventional Radiology

## 2021-03-11 ENCOUNTER — Other Ambulatory Visit (HOSPITAL_COMMUNITY): Payer: Self-pay | Admitting: Radiology

## 2021-03-11 ENCOUNTER — Ambulatory Visit (HOSPITAL_COMMUNITY)
Admission: RE | Admit: 2021-03-11 | Discharge: 2021-03-11 | Disposition: A | Discharge status: Home / Self Care | Payer: BC Managed Care – PPO | Source: Ambulatory Visit | Attending: Interventional Radiology | Admitting: Interventional Radiology

## 2021-03-11 ENCOUNTER — Other Ambulatory Visit: Payer: Self-pay

## 2021-03-11 DIAGNOSIS — C3412 Malignant neoplasm of upper lobe, left bronchus or lung: Secondary | ICD-10-CM

## 2021-03-11 DIAGNOSIS — B9562 Methicillin resistant Staphylococcus aureus infection as the cause of diseases classified elsewhere: Secondary | ICD-10-CM | POA: Diagnosis not present

## 2021-03-11 DIAGNOSIS — X58XXXA Exposure to other specified factors, initial encounter: Secondary | ICD-10-CM | POA: Diagnosis not present

## 2021-03-11 DIAGNOSIS — T859XXA Unspecified complication of internal prosthetic device, implant and graft, initial encounter: Secondary | ICD-10-CM | POA: Insufficient documentation

## 2021-03-11 DIAGNOSIS — Z1620 Resistance to unspecified antibiotic: Secondary | ICD-10-CM | POA: Insufficient documentation

## 2021-03-11 DIAGNOSIS — T8189XA Other complications of procedures, not elsewhere classified, initial encounter: Secondary | ICD-10-CM | POA: Diagnosis not present

## 2021-03-11 HISTORY — PX: IR RADIOLOGIST EVAL & MGMT: IMG5224

## 2021-03-11 NOTE — Procedures (Signed)
Gina Cervantes is a 60 year old female with history of lung cancer and Port-A-Cath placement on 01/12/2021. Due to concerns over possible infection she underwent removal of the port on 02/24/2021. Port site at that time did not appear to be infected and was closed with sutures. Since that time patient has had some persistent induration and redness in region along with tenderness. She has undergone 2 trials of doxycycline therapy with some mild improvement in pain, however area remains erythematous and indurated.  Previous port wound site with noticeable erythema and induration as well as some scab formation. Patient did have previous blister formation which has since ruptured. She has what appears to be a very tiny fluid collection in the lateral aspect of her wound and Dr. Laurence Ferrari performed needle aspiration and sent specimen for culture and sensitivity following sterile prep and Betadine application. Intra site gel was applied to previous blister wound site region and new nonstick gauze dressing was applied.  Following discussions with antibiotic pharmacist patient was started on Bactrim double strength 1 tablet b.i.d. for the next 7 days.  Patient will be scheduled to follow up in IR dept on 11/8.  We will follow-up with culture results.

## 2021-03-16 ENCOUNTER — Telehealth: Payer: Self-pay | Admitting: Student

## 2021-03-16 NOTE — Telephone Encounter (Signed)
Gina Cervantes is s/p PAC placement on 9/7, complicated by skin infection and s/p PAC removal on 10/20 due to skin infection, further complicated by persistent skin induration and tiny small fluid pocket development despite trial of doxycycline, the fluid collection was aspirated and sent for culture by Dr. Laurence Ferrari on 11/4.  The aspirated fluid pocket was filled with intra pocket gel. Patient was discharged home with Bactrim double strength, 1 table BID x 7 days, after pharmacy consult.   Received a call from the patient regarding culture result.  Informed the patient that the culture result is still pending, it is being re-intubated for better growth.  Patient is concerned about the culture result as she will be going out of town this weekend.  Patient states she has been taking antibiotics as directed, and she is scheduled for site check with IR tomorrow.  She states that she will discuss if she will need another round of abx during f/u visit tomorrow.   Will evaluate patient in person tomorrow and determine if further intervention is needed.  Please call IR for questions and concerns.    Armando Gang Dennis Hegeman PA-C 03/16/2021 2:06 PM

## 2021-03-17 ENCOUNTER — Ambulatory Visit (HOSPITAL_COMMUNITY)
Admission: RE | Admit: 2021-03-17 | Discharge: 2021-03-17 | Disposition: A | Discharge status: Home / Self Care | Payer: BC Managed Care – PPO | Source: Ambulatory Visit | Attending: Radiology | Admitting: Radiology

## 2021-03-17 ENCOUNTER — Other Ambulatory Visit: Payer: Self-pay

## 2021-03-17 DIAGNOSIS — C3412 Malignant neoplasm of upper lobe, left bronchus or lung: Secondary | ICD-10-CM

## 2021-03-17 LAB — AEROBIC CULTURE W GRAM STAIN (SUPERFICIAL SPECIMEN)

## 2021-03-17 NOTE — Progress Notes (Signed)
Ms. Atwood presents today for port check.  There is mild surrounding erythema about the healing incision and access site without evidence of purulence or discharge.  No recent fevers, chills.  Some mild fluid expression which has been clear, previously purulent.  She continues antibiotics, to finish the course tomorrow.    Encouraged continued great wound care and completion of antibiotics.  Follow up as needed.   Ruthann Cancer, MD Pager: [phone]

## 2021-03-27 ENCOUNTER — Other Ambulatory Visit: Payer: Self-pay | Admitting: Oncology

## 2021-03-28 ENCOUNTER — Other Ambulatory Visit: Payer: BC Managed Care – PPO

## 2021-03-28 ENCOUNTER — Telehealth: Payer: Self-pay

## 2021-03-28 ENCOUNTER — Inpatient Hospital Stay (HOSPITAL_BASED_OUTPATIENT_CLINIC_OR_DEPARTMENT_OTHER): Payer: BC Managed Care – PPO | Admitting: Oncology

## 2021-03-28 ENCOUNTER — Other Ambulatory Visit: Payer: Self-pay

## 2021-03-28 ENCOUNTER — Inpatient Hospital Stay: Payer: BC Managed Care – PPO | Attending: Oncology

## 2021-03-28 ENCOUNTER — Inpatient Hospital Stay: Payer: BC Managed Care – PPO

## 2021-03-28 VITALS — BP 119/65 | HR 88 | Temp 98.1°F | Resp 18 | Ht 65.0 in | Wt 167.2 lb

## 2021-03-28 VITALS — BP 108/56 | HR 87

## 2021-03-28 DIAGNOSIS — C3411 Malignant neoplasm of upper lobe, right bronchus or lung: Secondary | ICD-10-CM | POA: Insufficient documentation

## 2021-03-28 DIAGNOSIS — C3412 Malignant neoplasm of upper lobe, left bronchus or lung: Secondary | ICD-10-CM

## 2021-03-28 DIAGNOSIS — Z9221 Personal history of antineoplastic chemotherapy: Secondary | ICD-10-CM | POA: Diagnosis not present

## 2021-03-28 DIAGNOSIS — Z853 Personal history of malignant neoplasm of breast: Secondary | ICD-10-CM | POA: Insufficient documentation

## 2021-03-28 DIAGNOSIS — Z170 Estrogen receptor positive status [ER+]: Secondary | ICD-10-CM | POA: Insufficient documentation

## 2021-03-28 DIAGNOSIS — C3431 Malignant neoplasm of lower lobe, right bronchus or lung: Secondary | ICD-10-CM | POA: Diagnosis not present

## 2021-03-28 DIAGNOSIS — Z5111 Encounter for antineoplastic chemotherapy: Secondary | ICD-10-CM | POA: Insufficient documentation

## 2021-03-28 DIAGNOSIS — Z79899 Other long term (current) drug therapy: Secondary | ICD-10-CM | POA: Diagnosis not present

## 2021-03-28 DIAGNOSIS — Z923 Personal history of irradiation: Secondary | ICD-10-CM | POA: Diagnosis not present

## 2021-03-28 DIAGNOSIS — C3490 Malignant neoplasm of unspecified part of unspecified bronchus or lung: Secondary | ICD-10-CM

## 2021-03-28 LAB — CMP (CANCER CENTER ONLY)
ALT: 19 U/L (ref 0–44)
AST: 25 U/L (ref 15–41)
Albumin: 4.3 g/dL (ref 3.5–5.0)
Alkaline Phosphatase: 73 U/L (ref 38–126)
Anion gap: 10 (ref 5–15)
BUN: 18 mg/dL (ref 6–20)
CO2: 30 mmol/L (ref 22–32)
Calcium: 10.5 mg/dL — ABNORMAL HIGH (ref 8.9–10.3)
Chloride: 96 mmol/L — ABNORMAL LOW (ref 98–111)
Creatinine: 0.9 mg/dL (ref 0.44–1.00)
GFR, Estimated: >60 mL/min
Glucose, Bld: 100 mg/dL — ABNORMAL HIGH (ref 70–99)
Potassium: 4.3 mmol/L (ref 3.5–5.1)
Sodium: 136 mmol/L (ref 135–145)
Total Bilirubin: 0.6 mg/dL (ref 0.3–1.2)
Total Protein: 8.4 g/dL — ABNORMAL HIGH (ref 6.5–8.1)

## 2021-03-28 LAB — CBC WITH DIFFERENTIAL (CANCER CENTER ONLY)
Abs Immature Granulocytes: 0.03 10*3/uL (ref 0.00–0.07)
Basophils Absolute: 0.1 10*3/uL (ref 0.0–0.1)
Basophils Relative: 2 %
Eosinophils Absolute: 0.2 10*3/uL (ref 0.0–0.5)
Eosinophils Relative: 3 %
HCT: 38.9 % (ref 36.0–46.0)
Hemoglobin: 12.8 g/dL (ref 12.0–15.0)
Immature Granulocytes: 1 %
Lymphocytes Relative: 14 %
Lymphs Abs: 0.7 10*3/uL (ref 0.7–4.0)
MCH: 37.4 pg — ABNORMAL HIGH (ref 26.0–34.0)
MCHC: 32.9 g/dL (ref 30.0–36.0)
MCV: 113.7 fL — ABNORMAL HIGH (ref 80.0–100.0)
Monocytes Absolute: 0.7 10*3/uL (ref 0.1–1.0)
Monocytes Relative: 13 %
Neutro Abs: 3.6 10*3/uL (ref 1.7–7.7)
Neutrophils Relative %: 67 %
Platelet Count: 322 10*3/uL (ref 150–400)
RBC: 3.42 MIL/uL — ABNORMAL LOW (ref 3.87–5.11)
RDW: 17.2 % — ABNORMAL HIGH (ref 11.5–15.5)
WBC Count: 5.3 10*3/uL (ref 4.0–10.5)
nRBC: 0 % (ref 0.0–0.2)

## 2021-03-28 LAB — TSH: TSH: 2.553 u[IU]/mL (ref 0.350–4.500)

## 2021-03-28 MED ORDER — PROCHLORPERAZINE MALEATE 10 MG PO TABS
10.0000 mg | ORAL_TABLET | Freq: Once | ORAL | Status: AC
  Administered 2021-03-28: 10 mg via ORAL
  Filled 2021-03-28: qty 1

## 2021-03-28 MED ORDER — SODIUM CHLORIDE 0.9 % IV SOLN
500.0000 mg/m2 | Freq: Once | INTRAVENOUS | Status: AC
  Administered 2021-03-28: 900 mg via INTRAVENOUS
  Filled 2021-03-28: qty 20

## 2021-03-28 MED ORDER — SODIUM CHLORIDE 0.9 % IV SOLN: Freq: Once | INTRAVENOUS | Status: AC

## 2021-03-28 MED ORDER — HEPARIN SOD (PORK) LOCK FLUSH 100 UNIT/ML IV SOLN: 500.0000 [IU] | Freq: Once | INTRAVENOUS | Status: DC | PRN

## 2021-03-28 MED ORDER — SODIUM CHLORIDE 0.9 % IV SOLN
200.0000 mg | Freq: Once | INTRAVENOUS | Status: AC
  Administered 2021-03-28: 200 mg via INTRAVENOUS
  Filled 2021-03-28: qty 8

## 2021-03-28 MED ORDER — SULFAMETHOXAZOLE-TRIMETHOPRIM 800-160 MG PO TABS: 1.0000 | ORAL_TABLET | Freq: Two times a day (BID) | ORAL | 0 refills | Status: DC

## 2021-03-28 NOTE — Progress Notes (Signed)
[facility] OFFICE PROGRESS NOTE   Diagnosis: Non-small cell lung cancer  INTERVAL HISTORY:   Gina Cervantes completed another treatment with Alimta and pembrolizumab on 02/28/2021.  No rash, nausea, or diarrhea.  She continues to have erythema and discomfort at the right Port-A-Cath site.  She completed 2 courses of doxycycline without improvement.  No fever.  There is intermittent drainage from the Port-A-Cath site.  Objective:  Vital signs in last 24 hours:  Blood pressure 119/65, pulse 88, temperature 98.1 F (36.7 C), temperature source Oral, resp. rate 18, height _0  (1.651 m), weight 167 lb 3.2 oz (75.8 kg), last menstrual period 04/07/2008, SpO2 96 %.    HEENT: No thrush or ulcers Resp: Lungs with end inspiratory rhonchi/rub at the right upper posterior chest, no respiratory distress Cardio: Regular rate and rhythm GI: No hepatosplenomegaly Vascular: No leg edema   Portacath site with superficial opening of the incision, minimal serous drainage, no fluctuance, erythema and induration surrounds the incision extending to the upper aspect of the right breast.  No tenderness along the subcutaneous track or right neck.  Lab Results:  Lab Results  Component Value Date   WBC 5.3 03/28/2021   HGB 12.8 03/28/2021   HCT 38.9 03/28/2021   MCV 113.7 (H) 03/28/2021   PLT 322 03/28/2021   NEUTROABS 3.6 03/28/2021    CMP  Lab Results  Component Value Date   NA 137 02/28/2021   K 4.5 02/28/2021   CL 97 (L) 02/28/2021   CO2 30 02/28/2021   GLUCOSE 103 (H) 02/28/2021   BUN 20 02/28/2021   CREATININE 0.93 02/28/2021   CALCIUM 10.1 02/28/2021   PROT 7.7 02/28/2021   ALBUMIN 4.0 02/28/2021   AST 24 02/28/2021   ALT 21 02/28/2021   ALKPHOS 66 02/28/2021   BILITOT 0.6 02/28/2021   GFRNONAA >60 02/28/2021   GFRAA >60 01/29/2020    Lab Results  Component Value Date   CEA 5.5 (H) 08/02/2012     Medications: I have reviewed the patient's current  medications.   Assessment/Plan: Anal cancer-left anal margin/anal canal mass, status post a biopsy on 08/09/2012 confirming invasive moderately differentiated squamous cell carcinoma,p16 positive.   -Staging PET scan 08/20/2012-hypermetabolic anal mass, no hypermetabolic metastases   -Initiation of concurrent radiation with cycle 1 of mitomycin C./5-fluorouracil 09/02/2012.   -She completed cycle 2 5-fluorouracil/mitomycin C. beginning 10/01/2012.   -She completed radiation 10/10/2012.   2. Right breast cancer 2009, ER positive, PR positive, HER-2 positive. She is status post neoadjuvant AC x4 cycles followed by Taxol/Herceptin. She underwent a right lumpectomy and sentinel lymph node biopsy 09/22/2008 with no evidence of residual invasive carcinoma and 5 negative sentinel lymph nodes. She then completed right breast radiation. She began tamoxifen 12/15/2008 and completed adjuvant Herceptin 05/25/2009. The tamoxifen was discontinued and she was switched to Arimidex in July 2013.  Arimidex discontinued in mid July 2019. 3. History of enlargement of the right tonsil.   4. Nonspecific pulmonary nodules without hypermetabolic activity on the PET scan 08/20/2012. Chest CT 10/21/2012 with scattered pulmonary nodules including nodules that were not present in 2010. Annual surveillance was recommended. Follow-up chest CT 10/24/2013 with scattered groundglass densities again noted in both lungs mostly unchanged when compared to the previous study. A sub-solid right lower lobe nodule had increased central density. The size was unchanged.The nodule has been present since 2009. CT chest 06/13/2016-a sub-solid 9 mm right upper lobe nodule has enlarged compared to 2015, a right lower lobe irregular  nodular density appears more well defined, new vague 5 mm groundglass left upper lobe nodule, left lower lobe 10 mm groundglass nodule is more apparent, posterior left upper lobe circumscribed solid nodule measures 11 x 10  mm and is new PET scan 06/22/2016-malignant range activity involving the 11 mm left upper lobe nodule, no other hypermetabolic nodules, suspicious sub-solid right lower lobe nodule Wedge resection of a hypermetabolic left upper lobe nodule on 07/05/2016-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 No EGFR, ALK, BRAF, RET, ERBB2, or ROS1 alteration CT chest 11/14/2016-status post left upper lobe wedge resection, stable right lung nodules Chest CT 05/02/2017-stable bilateral solid and groundglass nodules, stable mild mediastinal lymphadenopathy CT chest 12/13/2017- enlargement of right upper lobe nodule, other lung nodules and chest lymph node stable PET 01/09/2018- right upper lobe nodule and right lower lobe nodule have enlarged since 2018 and have low level metabolic activity increased size of an 8 mm right upper lobe nodule without increased metabolic activity, stable activity and a mildly enlarged lower paratracheal node, no evidence of metastatic disease in the abdomen or pelvis CT chest 04/22/2018- no new lung nodules, dominant right lung nodules slightly increased in size Bronchoscopy/EBUS 05/04/2019 with biopsy of level 7 node, biopsy of right lower lobe nodule, FNA of right lower lobe nodule-negative CT biopsy of right upper lobe nodule 06/07/2018- adenocarcinoma with lepidic and acinar patterns SBRT to the dominant right upper lobe nodule 07/23/2018-07/30/2018 CT 09/16/2018-slight increase in size of medial right upper lobe nodule-possibly secondary to interval radiation, other nodules are unchanged CT 12/25/2018- medial right upper lobe nodule has decreased in size slightly, multiple additional solid and groundglass nodules are stable CT 06/09/2019-enlarging right lower lobe nodule, enlarging subsolid nodule, new right upper lobe nodule SBRT to right lower lobe lung nodule, 3  fractions 07/08/2019-07/14/2019 CT 12/05/2019-dominant right lower lobe lesion stable to slightly decreased in size, progressive solid component associated with a dominant right upper lung nodule, mild progression of a left upper lobe nodule, new subsolid anterior medial left upper lobe nodule Cycle 1 Alimta/carboplatin/pembrolizumab 02/04/2020 Cycle 2 Alimta/carboplatin/pembrolizumab 02/23/2020 Cycle 3 Alimta/carboplatin/pembrolizumab 03/15/2020 Cycle 4 Alimta/carboplatin/pembrolizumab 04/05/2020 (Alimta and carboplatin dose reduced secondary to anemia) CT chest 04/12/2020-decreased size of solid right lower lobe and right upper lobe nodules, groundglass nodules in the left lung unchanged Cycle 5 Alimta/pembrolizumab 05/03/2020 Cycle 6 Alimta/pembrolizumab 05/21/2020 Cycle 7 Alimta/pembrolizumab 06/14/2020 Cycle 8 Alimta/pembrolizumab 07/05/2020 CT chest 07/22/2020-stable nodules, no evidence of disease progression, unchanged multifocal groundglass nodules suspicious for multifocal adenocarcinoma. Cycle 9 Alimta/Pembrolizumab 07/26/2020 Cycle 10 Alimta/Pembrolizumab 08/16/2020 Cycle 11 Alimta/Pembrolizumab 09/06/2020 Cycle 12 Alimta/pembrolizumab 09/27/2020 Cycle 13 Alimta/pembrolizumab 10/18/2020 CT chest 11/12/2020-enlarging soft tissue density right upper lobe medially adjacent to the mediastinum measuring 2.7 x 2.1 cm, concerning for recurrent tumor.  Stable radiation changes involving the right upper lobe and right lower lobe.  Stable scattered small solid and subsolid pulmonary nodules bilaterally.  New right-sided pleural effusion.  Stable borderline enlarged mediastinal lymph nodes. Bronchoscopy 12/28/2020-right upper lobe and left lower lobe lesions biopsy-negative pathology PET 7/41/4239-[id number] metabolic activity involving the right suprahilar consolidative process-favored benign, measured smaller, additional groundglass densities with very low-level metabolic activity, radiation change in the right lower lobe,  resolution of right pleural effusion, no distant metastatic disease Cycle 14 Alimta/pembrolizumab 01/19/2021 Cycle 15 Alimta/pembrolizumab 02/07/2021 Cycle 16 of Alimta/pembrolizumab 02/28/2021 Cycle 17 Alimta/pembrolizumab 03/28/2021 5.  Baker's cyst left leg, pain and swelling in the left calf, negative  Doppler 01/24/2020 and 01/29/2020-evaluated by orthopedics and underwent aspiration of the cyst 01/29/2000, completed a course of Keflex 6.  Anemia secondary to chemotherapy, status post 2 units of packed red blood cells on 04/10/2020-improved 7.  Port-A-Cath placement 01/12/2021; Port-A-Cath removal 02/24/2021 due to infection, culture positive for MRSA, status post 2 courses of doxycycline, Bactrim DS started 03/28/2021     Disposition: Ms. Palla continues to tolerate the Alimta and pembrolizumab well.  She will complete another treatment today.  She will undergo a restaging CT evaluation after this cycle.  She underwent removal of an infected Port-A-Cath last month.  The port site remains indurated and open.  I discussed the case with the infectious disease service.  She will complete a course of Bactrim.  If there is no improvement following Bactrim I will make referral to the infectious disease service to consider IV antibiotics and additional evaluation.  She will contact us if the port site has not improved over the next week.  Betsy Coder, MD  03/28/2021  11:07 AM

## 2021-03-28 NOTE — Telephone Encounter (Signed)
Patient seen by Dr. Sherrill today ? ?Vitals are within treatment parameters. ? ?Labs reviewed by Dr. Sherrill and are within treatment parameters. ? ?Per physician team, patient is ready for treatment and there are NO modifications to the treatment plan.  ?

## 2021-03-28 NOTE — Patient Instructions (Signed)
[facility]  Discharge Instructions: Thank you for choosing [facility] to provide your oncology and hematology care.   If you have a lab appointment with the [facility], please go directly to the [facility] and check in at the registration area.   Wear comfortable clothing and clothing appropriate for easy access to any Portacath or PICC line.   We strive to give you quality time with your provider. You may need to reschedule your appointment if you arrive late (15 or more minutes).  Arriving late affects you and other patients whose appointments are after yours.  Also, if you miss three or more appointments without notifying the office, you may be dismissed from the clinic at the provider's discretion.      For prescription refill requests, have your pharmacy contact our office and allow 72 hours for refills to be completed.    Today you received the following chemotherapy and/or immunotherapy agents: Keytruda, Alimta  Pembrolizumab injection What is this medication? PEMBROLIZUMAB (pem broe liz ue mab) is a monoclonal antibody. It is used to treat certain types of cancer. This medicine may be used for other purposes; ask your health care provider or pharmacist if you have questions. COMMON BRAND NAME(S): Keytruda What should I tell my care team before I take this medication? They need to know if you have any of these conditions: autoimmune diseases like Crohn's disease, ulcerative colitis, or lupus have had or planning to have an allogeneic stem cell transplant (uses someone else's stem cells) history of organ transplant history of chest radiation nervous system problems like myasthenia gravis or Guillain-Barre syndrome an unusual or allergic reaction to pembrolizumab, other medicines, foods, dyes, or preservatives pregnant or trying to get pregnant breast-feeding How should I use this medication? This medicine is for infusion into a  vein. It is given by a health care professional in a hospital or clinic setting. A special MedGuide will be given to you before each treatment. Be sure to read this information carefully each time. Talk to your pediatrician regarding the use of this medicine in children. While this drug may be prescribed for children as young as 6 months for selected conditions, precautions do apply. Overdosage: If you think you have taken too much of this medicine contact a poison control center or emergency room at once. NOTE: This medicine is only for you. Do not share this medicine with others. What if I miss a dose? It is important not to miss your dose. Call your doctor or health care professional if you are unable to keep an appointment. What may interact with this medication? Interactions have not been studied. This list may not describe all possible interactions. Give your health care provider a list of all the medicines, herbs, non-prescription drugs, or dietary supplements you use. Also tell them if you smoke, drink alcohol, or use illegal drugs. Some items may interact with your medicine. What should I watch for while using this medication? Your condition will be monitored carefully while you are receiving this medicine. You may need blood work done while you are taking this medicine. Do not become pregnant while taking this medicine or for 4 months after stopping it. Women should inform their doctor if they wish to become pregnant or think they might be pregnant. There is a potential for serious side effects to an unborn child. Talk to your health care professional or pharmacist for more information. Do not breast-feed an infant while taking  this medicine or for 4 months after the last dose. What side effects may I notice from receiving this medication? Side effects that you should report to your doctor or health care professional as soon as possible: allergic reactions like skin rash, itching or hives,  swelling of the face, lips, or tongue bloody or black, tarry breathing problems changes in vision chest pain chills confusion constipation cough diarrhea dizziness or feeling faint or lightheaded fast or irregular heartbeat fever flushing joint pain low blood counts - this medicine may decrease the number of white blood cells, red blood cells and platelets. You may be at increased risk for infections and bleeding. muscle pain muscle weakness pain, tingling, numbness in the hands or feet persistent headache redness, blistering, peeling or loosening of the skin, including inside the mouth signs and symptoms of high blood sugar such as dizziness; dry mouth; dry skin; fruity breath; nausea; stomach pain; increased hunger or thirst; increased urination signs and symptoms of kidney injury like trouble passing urine or change in the amount of urine signs and symptoms of liver injury like dark urine, light-colored stools, loss of appetite, nausea, right upper belly pain, yellowing of the eyes or skin sweating swollen lymph nodes weight loss Side effects that usually do not require medical attention (report to your doctor or health care professional if they continue or are bothersome): decreased appetite hair loss tiredness This list may not describe all possible side effects. Call your doctor for medical advice about side effects. You may report side effects to FDA at 1-800-FDA-1088. Where should I keep my medication? This drug is given in a hospital or clinic and will not be stored at home. NOTE: This sheet is a summary. It may not cover all possible information. If you have questions about this medicine, talk to your doctor, pharmacist, or health care provider.  2022 Elsevier/Gold Standard (2021-01-11 00:00:00)   Pemetrexed injection What is this medication? PEMETREXED (PEM e TREX ed) is a chemotherapy drug used to treat lung cancers like non-small cell lung cancer and  mesothelioma. It may also be used to treat other cancers. This medicine may be used for other purposes; ask your health care provider or pharmacist if you have questions. COMMON BRAND NAME(S): Alimta, PEMFEXY What should I tell my care team before I take this medication? They need to know if you have any of these conditions: infection (especially a virus infection such as chickenpox, cold sores, or herpes) kidney disease low blood counts, like low white cell, platelet, or red cell counts lung or breathing disease, like asthma radiation therapy an unusual or allergic reaction to pemetrexed, other medicines, foods, dyes, or preservative pregnant or trying to get pregnant breast-feeding How should I use this medication? This drug is given as an infusion into a vein. It is administered in a hospital or clinic by a specially trained health care professional. Talk to your pediatrician regarding the use of this medicine in children. Special care may be needed. Overdosage: If you think you have taken too much of this medicine contact a poison control center or emergency room at once. NOTE: This medicine is only for you. Do not share this medicine with others. What if I miss a dose? It is important not to miss your dose. Call your doctor or health care professional if you are unable to keep an appointment. What may interact with this medication? This medicine may interact with the following medications: Ibuprofen This list may not describe all possible interactions. Give  your health care provider a list of all the medicines, herbs, non-prescription drugs, or dietary supplements you use. Also tell them if you smoke, drink alcohol, or use illegal drugs. Some items may interact with your medicine. What should I watch for while using this medication? Visit your doctor for checks on your progress. This drug may make you feel generally unwell. This is not uncommon, as chemotherapy can affect healthy cells  as well as cancer cells. Report any side effects. Continue your course of treatment even though you feel ill unless your doctor tells you to stop. In some cases, you may be given additional medicines to help with side effects. Follow all directions for their use. Call your doctor or health care professional for advice if you get a fever, chills or sore throat, or other symptoms of a cold or flu. Do not treat yourself. This drug decreases your body's ability to fight infections. Try to avoid being around people who are sick. This medicine may increase your risk to bruise or bleed. Call your doctor or health care professional if you notice any unusual bleeding. Be careful brushing and flossing your teeth or using a toothpick because you may get an infection or bleed more easily. If you have any dental work done, tell your dentist you are receiving this medicine. Avoid taking products that contain aspirin, acetaminophen, ibuprofen, naproxen, or ketoprofen unless instructed by your doctor. These medicines may hide a fever. Call your doctor or health care professional if you get diarrhea or mouth sores. Do not treat yourself. To protect your kidneys, drink water or other fluids as directed while you are taking this medicine. Do not become pregnant while taking this medicine or for 6 months after stopping it. Women should inform their doctor if they wish to become pregnant or think they might be pregnant. Men should not father a child while taking this medicine and for 3 months after stopping it. This may interfere with the ability to father a child. You should talk to your doctor or health care professional if you are concerned about your fertility. There is a potential for serious side effects to an unborn child. Talk to your health care professional or pharmacist for more information. Do not breast-feed an infant while taking this medicine or for 1 week after stopping it. What side effects may I notice from  receiving this medication? Side effects that you should report to your doctor or health care professional as soon as possible: allergic reactions like skin rash, itching or hives, swelling of the face, lips, or tongue breathing problems redness, blistering, peeling or loosening of the skin, including inside the mouth signs and symptoms of bleeding such as bloody or black, tarry stools; red or dark-brown urine; spitting up blood or brown material that looks like coffee grounds; red spots on the skin; unusual bruising or bleeding from the eye, gums, or nose signs and symptoms of infection like fever or chills; cough; sore throat; pain or trouble passing urine signs and symptoms of kidney injury like trouble passing urine or change in the amount of urine signs and symptoms of liver injury like dark yellow or brown urine; general ill feeling or flu-like symptoms; light-colored stools; loss of appetite; nausea; right upper belly pain; unusually weak or tired; yellowing of the eyes or skin Side effects that usually do not require medical attention (report to your doctor or health care professional if they continue or are bothersome): constipation mouth sores nausea, vomiting unusually weak or  tired This list may not describe all possible side effects. Call your doctor for medical advice about side effects. You may report side effects to FDA at 1-800-FDA-1088. Where should I keep my medication? This drug is given in a hospital or clinic and will not be stored at home. NOTE: This sheet is a summary. It may not cover all possible information. If you have questions about this medicine, talk to your doctor, pharmacist, or health care provider.  2022 Elsevier/Gold Standard (2017-06-19 00:00:00)    To help prevent nausea and vomiting after your treatment, we encourage you to take your nausea medication as directed.  BELOW ARE SYMPTOMS THAT SHOULD BE REPORTED IMMEDIATELY: *FEVER GREATER THAN 100.4 F (38  C) OR HIGHER *CHILLS OR SWEATING *NAUSEA AND VOMITING THAT IS NOT CONTROLLED WITH YOUR NAUSEA MEDICATION *UNUSUAL SHORTNESS OF BREATH *UNUSUAL BRUISING OR BLEEDING *URINARY PROBLEMS (pain or burning when urinating, or frequent urination) *BOWEL PROBLEMS (unusual diarrhea, constipation, pain near the anus) TENDERNESS IN MOUTH AND THROAT WITH OR WITHOUT PRESENCE OF ULCERS (sore throat, sores in mouth, or a toothache) UNUSUAL RASH, SWELLING OR PAIN  UNUSUAL VAGINAL DISCHARGE OR ITCHING   Items with * indicate a potential emergency and should be followed up as soon as possible or go to the Emergency Department if any problems should occur.  Please show the CHEMOTHERAPY ALERT CARD or IMMUNOTHERAPY ALERT CARD at check-in to the Emergency Department and triage nurse.  Should you have questions after your visit or need to cancel or reschedule your appointment, please contact [facility]  Dept: [phone]  and follow the prompts.  Office hours are 8:00 a.m. to 4:30 p.m. Monday - Friday. Please note that voicemails left after 4:00 p.m. may not be returned until the following business day.  We are closed weekends and major holidays. You have access to a nurse at all times for urgent questions. Please call the main number to the clinic Dept: [phone] and follow the prompts.   For any non-urgent questions, you may also contact your provider using MyChart. We now offer e-Visits for anyone 60 and older to request care online for non-urgent symptoms. For details visit [email or web address].   Also download the MyChart app! Go to the app store, search "MyChart", open the app, select [facility], and log in with your MyChart username and password.  Due to Covid, a mask is required upon entering the hospital/clinic. If you do not have a mask, one will be given to you upon arrival. For doctor visits, patients may have 1 support person aged 50 or older with them. For treatment  visits, patients cannot have anyone with them due to current Covid guidelines and our immunocompromised population.

## 2021-03-28 NOTE — Progress Notes (Signed)
Patient presents for treatment. RN assessment completed along with the following:  Labs/vitals reviewed - Yes, and within treatment parameters.   Weight within 10% of previous measurement - Yes Oncology Treatment Attestation completed for current therapy- 01/19/2021 Informed consent completed and reflects current therapy/intent - Yes, on date 02/04/2020     Provider progress note reviewed - Yes, today's provider note reviewed.  Treatment/Antibody/Supportive plan reviewed - Yes, and there are no adjustments needed for today's treatment. S&H and other orders reviewed - Yes, and there are no additional orders identified. Previous treatment date reviewed - Yes, and the appropriate amount of time has elapsed between treatments.  Patient to proceed with treatment.

## 2021-04-15 ENCOUNTER — Telehealth: Payer: Self-pay | Admitting: *Deleted

## 2021-04-15 NOTE — Telephone Encounter (Signed)
Noted on schedule for 12/12 that CT scan is after visit/chemo appointment. Per Dr. Benay Spice: Needs to have CT scan prior to next treatment. Will need to reschedule OV/treatment day. Called patient and left VM w/above information and to inform office when she would like to move her appointment to, or is she willing to have CT scan early am on 12/12 if available. Requested return call by 4 pm today.

## 2021-04-16 ENCOUNTER — Other Ambulatory Visit: Payer: Self-pay | Admitting: Oncology

## 2021-04-18 ENCOUNTER — Telehealth: Payer: Self-pay

## 2021-04-18 ENCOUNTER — Other Ambulatory Visit: Payer: Self-pay

## 2021-04-18 ENCOUNTER — Inpatient Hospital Stay: Payer: BC Managed Care – PPO | Admitting: Nurse Practitioner

## 2021-04-18 ENCOUNTER — Ambulatory Visit
Admission: RE | Admit: 2021-04-18 | Discharge: 2021-04-18 | Disposition: A | Discharge status: Home / Self Care | Payer: BC Managed Care – PPO | Source: Ambulatory Visit | Attending: Oncology | Admitting: Oncology

## 2021-04-18 ENCOUNTER — Encounter: Payer: Self-pay | Admitting: Oncology

## 2021-04-18 ENCOUNTER — Inpatient Hospital Stay: Payer: BC Managed Care – PPO

## 2021-04-18 ENCOUNTER — Other Ambulatory Visit: Payer: BC Managed Care – PPO

## 2021-04-18 DIAGNOSIS — J90 Pleural effusion, not elsewhere classified: Secondary | ICD-10-CM | POA: Diagnosis not present

## 2021-04-18 DIAGNOSIS — C3412 Malignant neoplasm of upper lobe, left bronchus or lung: Secondary | ICD-10-CM

## 2021-04-18 DIAGNOSIS — R918 Other nonspecific abnormal finding of lung field: Secondary | ICD-10-CM | POA: Diagnosis not present

## 2021-04-18 DIAGNOSIS — J439 Emphysema, unspecified: Secondary | ICD-10-CM | POA: Diagnosis not present

## 2021-04-18 DIAGNOSIS — R911 Solitary pulmonary nodule: Secondary | ICD-10-CM | POA: Diagnosis not present

## 2021-04-18 NOTE — Telephone Encounter (Signed)
TC Pt and left a VM to reschedule her appointment for 04/18/21

## 2021-04-18 NOTE — Telephone Encounter (Signed)
TC to the Pt discuss her appointment. Her appointment was cancel per by Dr. Benay Spice. No new schedule appointment until her CT scan is completed PT verbalizing understanding

## 2021-04-22 ENCOUNTER — Inpatient Hospital Stay: Payer: BC Managed Care – PPO | Attending: Oncology | Admitting: Oncology

## 2021-04-22 ENCOUNTER — Telehealth: Payer: Self-pay

## 2021-04-22 ENCOUNTER — Encounter: Payer: Self-pay | Admitting: Oncology

## 2021-04-22 ENCOUNTER — Telehealth: Payer: Self-pay | Admitting: Medical Oncology

## 2021-04-22 ENCOUNTER — Other Ambulatory Visit: Payer: Self-pay

## 2021-04-22 ENCOUNTER — Telehealth: Payer: Self-pay | Admitting: *Deleted

## 2021-04-22 VITALS — BP 115/52 | HR 90 | Temp 98.5°F | Resp 20 | Ht 65.0 in | Wt 166.0 lb

## 2021-04-22 DIAGNOSIS — Z85048 Personal history of other malignant neoplasm of rectum, rectosigmoid junction, and anus: Secondary | ICD-10-CM | POA: Insufficient documentation

## 2021-04-22 DIAGNOSIS — Z9221 Personal history of antineoplastic chemotherapy: Secondary | ICD-10-CM | POA: Insufficient documentation

## 2021-04-22 DIAGNOSIS — C3431 Malignant neoplasm of lower lobe, right bronchus or lung: Secondary | ICD-10-CM | POA: Diagnosis not present

## 2021-04-22 DIAGNOSIS — C3490 Malignant neoplasm of unspecified part of unspecified bronchus or lung: Secondary | ICD-10-CM

## 2021-04-22 DIAGNOSIS — Z853 Personal history of malignant neoplasm of breast: Secondary | ICD-10-CM | POA: Insufficient documentation

## 2021-04-22 DIAGNOSIS — Z923 Personal history of irradiation: Secondary | ICD-10-CM | POA: Diagnosis not present

## 2021-04-22 NOTE — Progress Notes (Deleted)
Anal cancer-left anal margin/anal canal mass, status post a biopsy on 08/09/2012 confirming invasive moderately differentiated squamous cell carcinoma,p16 positive.   -Staging PET scan 08/20/2012-hypermetabolic anal mass, no hypermetabolic metastases   -Initiation of concurrent radiation with cycle 1 of mitomycin C./5-fluorouracil 09/02/2012.   -She completed cycle 2 5-fluorouracil/mitomycin C. beginning 10/01/2012.   -She completed radiation 10/10/2012.   2. Right breast cancer 2009, ER positive, PR positive, HER-2 positive. She is status post neoadjuvant AC x4 cycles followed by Taxol/Herceptin. She underwent a right lumpectomy and sentinel lymph node biopsy 09/22/2008 with no evidence of residual invasive carcinoma and 5 negative sentinel lymph nodes. She then completed right breast radiation. She began tamoxifen 12/15/2008 and completed adjuvant Herceptin 05/25/2009. The tamoxifen was discontinued and she was switched to Arimidex in July 2013.  Arimidex discontinued in mid July 2019. 3. History of enlargement of the right tonsil.   4. Nonspecific pulmonary nodules without hypermetabolic activity on the PET scan 08/20/2012. Chest CT 10/21/2012 with scattered pulmonary nodules including nodules that were not present in 2010. Annual surveillance was recommended. Follow-up chest CT 10/24/2013 with scattered groundglass densities again noted in both lungs mostly unchanged when compared to the previous study. A sub-solid right lower lobe nodule had increased central density. The size was unchanged.The nodule has been present since 2009. CT chest 06/13/2016-a sub-solid 9 mm right upper lobe nodule has enlarged compared to 2015, a right lower lobe irregular nodular density appears more well defined, new vague 5 mm groundglass left upper lobe nodule, left lower lobe 10 mm groundglass nodule is more apparent, posterior left upper lobe circumscribed solid nodule measures 11 x 10 mm and is new PET scan  06/22/2016-malignant range activity involving the 11 mm left upper lobe nodule, no other hypermetabolic nodules, suspicious sub-solid right lower lobe nodule Wedge resection of a hypermetabolic left upper lobe nodule on 07/05/2016-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 No EGFR, ALK, BRAF, RET, ERBB2, or ROS1 alteration CT chest 11/14/2016-status post left upper lobe wedge resection, stable right lung nodules Chest CT 05/02/2017-stable bilateral solid and groundglass nodules, stable mild mediastinal lymphadenopathy CT chest 12/13/2017- enlargement of right upper lobe nodule, other lung nodules and chest lymph node stable PET 01/09/2018- right upper lobe nodule and right lower lobe nodule have enlarged since 2018 and have low level metabolic activity increased size of an 8 mm right upper lobe nodule without increased metabolic activity, stable activity and a mildly enlarged lower paratracheal node, no evidence of metastatic disease in the abdomen or pelvis CT chest 04/22/2018- no new lung nodules, dominant right lung nodules slightly increased in size Bronchoscopy/EBUS 05/04/2019 with biopsy of level 7 node, biopsy of right lower lobe nodule, FNA of right lower lobe nodule-negative CT biopsy of right upper lobe nodule 06/07/2018- adenocarcinoma with lepidic and acinar patterns SBRT to the dominant right upper lobe nodule 07/23/2018-07/30/2018 CT 09/16/2018-slight increase in size of medial right upper lobe nodule-possibly secondary to interval radiation, other nodules are unchanged CT 12/25/2018- medial right upper lobe nodule has decreased in size slightly, multiple additional solid and groundglass nodules are stable CT 06/09/2019-enlarging right lower lobe nodule, enlarging subsolid nodule, new right upper lobe nodule SBRT to right lower lobe lung nodule, 3 fractions  07/08/2019-07/14/2019 CT 12/05/2019-dominant right lower lobe lesion stable to slightly decreased in size, progressive solid component associated with a dominant right upper lung nodule, mild progression of a left upper lobe  nodule, new subsolid anterior medial left upper lobe nodule Cycle 1 Alimta/carboplatin/pembrolizumab 02/04/2020 Cycle 2 Alimta/carboplatin/pembrolizumab 02/23/2020 Cycle 3 Alimta/carboplatin/pembrolizumab 03/15/2020 Cycle 4 Alimta/carboplatin/pembrolizumab 04/05/2020 (Alimta and carboplatin dose reduced secondary to anemia) CT chest 04/12/2020-decreased size of solid right lower lobe and right upper lobe nodules, groundglass nodules in the left lung unchanged Cycle 5 Alimta/pembrolizumab 05/03/2020 Cycle 6 Alimta/pembrolizumab 05/21/2020 Cycle 7 Alimta/pembrolizumab 06/14/2020 Cycle 8 Alimta/pembrolizumab 07/05/2020 CT chest 07/22/2020-stable nodules, no evidence of disease progression, unchanged multifocal groundglass nodules suspicious for multifocal adenocarcinoma. Cycle 9 Alimta/Pembrolizumab 07/26/2020 Cycle 10 Alimta/Pembrolizumab 08/16/2020 Cycle 11 Alimta/Pembrolizumab 09/06/2020 Cycle 12 Alimta/pembrolizumab 09/27/2020 Cycle 13 Alimta/pembrolizumab 10/18/2020 CT chest 11/12/2020-enlarging soft tissue density right upper lobe medially adjacent to the mediastinum measuring 2.7 x 2.1 cm, concerning for recurrent tumor.  Stable radiation changes involving the right upper lobe and right lower lobe.  Stable scattered small solid and subsolid pulmonary nodules bilaterally.  New right-sided pleural effusion.  Stable borderline enlarged mediastinal lymph nodes. Bronchoscopy 12/28/2020-right upper lobe and left lower lobe lesions biopsy-negative pathology PET 12/03/4260-JII metabolic activity involving the right suprahilar consolidative process-favored benign, measured smaller, additional groundglass densities with very low-level metabolic activity, radiation change in the right lower lobe,  resolution of right pleural effusion, no distant metastatic disease Cycle 14 Alimta/pembrolizumab 01/19/2021 Cycle 15 Alimta/pembrolizumab 02/07/2021 Cycle 16 of Alimta/pembrolizumab 02/28/2021 Cycle 17 Alimta/pembrolizumab 03/28/2021 5.  Baker's cyst left leg, pain and swelling in the left calf, negative Doppler 01/24/2020 and 01/29/2020-evaluated by orthopedics and underwent aspiration of the cyst 01/29/2000, completed a course of Keflex 6.  Anemia secondary to chemotherapy, status post 2 units of packed red blood cells on 04/10/2020-improved 7.  Port-A-Cath placement 01/12/2021; Port-A-Cath removal 02/24/2021 due to infection, culture positive for MRSA, status post 2 courses of doxycycline, Bactrim DS started 03/28/2021

## 2021-04-22 NOTE — Telephone Encounter (Signed)
LUNGMAP: A MASTER PROTOCOL TO EVALUATE BIOMARKER-DRIVEN THERAPIES AND IMMUNOTHERAPIES IN PREVIOUSLY-TREATED NON-SMALL CELL LUNG CANCER (Lung-MAP Screening Study)  Sub-study: S1900E, A PHASE II STUDY OF SOTORASIB (AMG 510) IN PARTICIPANTS WITH PREVIOUSLY  TREATED STAGE IV OR RECURRENT KRAS G12C MUTATED NON-SQUAMOUS NON-SMALL CELL  LUNG CANCER (ECOG-ACRIN LUNG-MAP SUB-STUDY  LVMOM with patient regarding referral from Dr. Benay Spice. Introduced myself to patient and informed her that I had received her referral from Dr. Benay Spice and that she had expressed interest in study. Informed patient that I currently will be confirming her eligibility for the study and should she have any questions regarding, to please call me. My contact information was provided. Patient thanked.  Maxwell Marion, RN, BSN, [facility] Clinical Research Nurse Lead 04/22/2021 2:55 PM

## 2021-04-22 NOTE — Progress Notes (Signed)
[facility] OFFICE PROGRESS NOTE   Diagnosis: Non-small cell lung cancer  INTERVAL HISTORY:   Gina Cervantes is as scheduled.  She completed another cycle of Alimta/pembrolizumab on 03/28/2021.  The Port-A-Cath site initially closed after the course of Bactrim last month.  The chest surrounding the Port-A-Cath site remains erythematous and there is again drainage.  No dyspnea.  Objective:  Vital signs in last 24 hours:  Blood pressure (!) 115/52, pulse 90, temperature 98.5 F (36.9 C), resp. rate 20, height _0  (1.651 m), weight 166 lb (75.3 kg), last menstrual period 04/07/2008.    Resp: Lungs clear bilaterally Cardio: Regular rate and rhythm GI: No hepatosplenomegaly Vascular: No leg edema  Skin: Right upper chest Port-A-Cath site with induration, mild erythema, and superficial ulceration, no fluctuance.  I could not express drainage.  Lab Results:  Lab Results  Component Value Date   WBC 5.3 03/28/2021   HGB 12.8 03/28/2021   HCT 38.9 03/28/2021   MCV 113.7 (H) 03/28/2021   PLT 322 03/28/2021   NEUTROABS 3.6 03/28/2021    CMP  Lab Results  Component Value Date   NA 136 03/28/2021   K 4.3 03/28/2021   CL 96 (L) 03/28/2021   CO2 30 03/28/2021   GLUCOSE 100 (H) 03/28/2021   BUN 18 03/28/2021   CREATININE 0.90 03/28/2021   CALCIUM 10.5 (H) 03/28/2021   PROT 8.4 (H) 03/28/2021   ALBUMIN 4.3 03/28/2021   AST 25 03/28/2021   ALT 19 03/28/2021   ALKPHOS 73 03/28/2021   BILITOT 0.6 03/28/2021   GFRNONAA >60 03/28/2021   GFRAA >60 01/29/2020    Lab Results  Component Value Date   CEA 5.5 (H) 08/02/2012      Imaging:  CT CHEST WO CONTRAST  Result Date: 04/19/2021 CLINICAL DATA:  Primary Cancer Type: Lung Imaging Indication: Assess response to therapy Interval therapy since last imaging? Yes Initial Cancer Diagnosis Date: 07/05/2016; Established by: Biopsy-proven Detailed Pathology: Stage I well-differentiated adenosquamous carcinoma of lung.  Primary Tumor location: Left upper lobe. Recurrence? Yes; Date(s) of recurrence: 12/28/2020; Established by: Imaging only Surgeries: Wedge resection of left upper lobe mass 07/05/2016. Chemotherapy: Yes; Ongoing? Yes; Most recent administration: 03/28/2021 Immunotherapy? No Radiation therapy? Yes Date Range: 07/08/2019 - 07/14/2019; Target: Right lower lobe Date Range: 07/23/2018 - 07/30/2018; Target: Right upper lobe Other Cancer Therapies: Right breast cancer in 2010 with chemotherapy and radiation therapy after lumpectomy. EXAM: CT CHEST WITHOUT CONTRAST TECHNIQUE: Multidetector CT imaging of the chest was performed following the standard protocol without IV contrast. COMPARISON:  Most recent CT chest 11/12/2020.  12/29/2020 PET-CT. FINDINGS: Cardiovascular: Heart size is normal. There is no significant pericardial fluid, thickening or pericardial calcification. There is aortic atherosclerosis, as well as atherosclerosis of the great vessels of the mediastinum and the coronary arteries, including calcified atherosclerotic plaque in the left main, left anterior descending, left circumflex and right coronary arteries. Mediastinum/Nodes: No pathologically enlarged mediastinal or hilar lymph nodes. Please note that accurate exclusion of hilar adenopathy is limited on noncontrast CT scans. Esophagus is unremarkable in appearance. No axillary lymphadenopathy. Lungs/Pleura: Increasing conspicuity of a mass-like area of architectural distortion in the medial aspect of the right upper lobe (axial image 36 of series 5), currently measuring 3.2 x 2.6 cm (previously 2.7 x 2.1 cm on prior chest CT 11/12/2020 and 2.2 x 2.0 cm on PET-CT 12/29/2020). Increasing masslike area of architectural distortion in the central right lower lobe (axial image 81 of series 5), currently measuring 2.8  x 2.2 cm, with surrounding ground-glass attenuation and interval development of a satellite nodule (also axial image 81 of series 5) measuring 9  x 7 mm. Several other patchy areas of ground-glass attenuation are scattered throughout the lungs bilaterally, most conspicuous of which is a ground-glass attenuation and cystic lesion in the left lower lobe (axial image 86 of series 5) which measures 1.4 x 1.3 cm (previously 1.4 x 1.0 cm). Subpleural thickening and architectural distortion in the anterolateral aspect of the right lung, deep to the right breast, presumably chronic postradiation changes. Chronic right-sided pleural thickening and trace volume of right pleural fluid, decreased compared to the prior examination. No left pleural effusion. Status post left upper lobe wedge resection. Mild centrilobular and paraseptal emphysema. Upper Abdomen: Aortic atherosclerosis. Musculoskeletal: There are no aggressive appearing lytic or blastic lesions noted in the visualized portions of the skeleton. IMPRESSION: 1. Increasing mass-like areas of architectural distortion in the medial right upper lobe and central right lower lobe, concerning for progressive malignancy. New satellite nodule also noted in the right lower lobe, as detailed above. 2. Other smaller predominantly ground-glass attenuation lesions in the lungs appear similar to the prior examination, as above. 3. Slight decrease in size of chronic right-sided pleural effusion with chronic right-sided pleural thickening. 4. Aortic atherosclerosis, in addition to left main and 3 vessel coronary artery disease. Please note that although the presence of coronary artery calcium documents the presence of coronary artery disease, the severity of this disease and any potential stenosis cannot be assessed on this non-gated CT examination. Assessment for potential risk factor modification, dietary therapy or pharmacologic therapy may be warranted, if clinically indicated. 5. Mild centrilobular and paraseptal emphysema; imaging findings suggestive of underlying COPD. Electronically Signed   By: Vinnie Langton M.D.    On: 04/19/2021 10:42    Medications: I have reviewed the patient's current medications.   Assessment/Plan: Anal cancer-left anal margin/anal canal mass, status post a biopsy on 08/09/2012 confirming invasive moderately differentiated squamous cell carcinoma,p16 positive.   -Staging PET scan 08/20/2012-hypermetabolic anal mass, no hypermetabolic metastases   -Initiation of concurrent radiation with cycle 1 of mitomycin C./5-fluorouracil 09/02/2012.   -She completed cycle 2 5-fluorouracil/mitomycin C. beginning 10/01/2012.   -She completed radiation 10/10/2012.   2. Right breast cancer 2009, ER positive, PR positive, HER-2 positive. She is status post neoadjuvant AC x4 cycles followed by Taxol/Herceptin. She underwent a right lumpectomy and sentinel lymph node biopsy 09/22/2008 with no evidence of residual invasive carcinoma and 5 negative sentinel lymph nodes. She then completed right breast radiation. She began tamoxifen 12/15/2008 and completed adjuvant Herceptin 05/25/2009. The tamoxifen was discontinued and she was switched to Arimidex in July 2013.  Arimidex discontinued in mid July 2019. 3. History of enlargement of the right tonsil.   4. Nonspecific pulmonary nodules without hypermetabolic activity on the PET scan 08/20/2012. Chest CT 10/21/2012 with scattered pulmonary nodules including nodules that were not present in 2010. Annual surveillance was recommended. Follow-up chest CT 10/24/2013 with scattered groundglass densities again noted in both lungs mostly unchanged when compared to the previous study. A sub-solid right lower lobe nodule had increased central density. The size was unchanged.The nodule has been present since 2009. CT chest 06/13/2016-a sub-solid 9 mm right upper lobe nodule has enlarged compared to 2015, a right lower lobe irregular nodular density appears more well defined, new vague 5 mm groundglass left upper lobe nodule, left lower lobe 10 mm groundglass nodule is more  apparent, posterior left upper lobe  circumscribed solid nodule measures 11 x 10 mm and is new PET scan 06/22/2016-malignant range activity involving the 11 mm left upper lobe nodule, no other hypermetabolic nodules, suspicious sub-solid right lower lobe nodule Wedge resection of a hypermetabolic left upper lobe nodule on 07/05/2016-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 No EGFR, ALK, BRAF, RET, ERBB2, or ROS1 alteration CT chest 11/14/2016-status post left upper lobe wedge resection, stable right lung nodules Chest CT 05/02/2017-stable bilateral solid and groundglass nodules, stable mild mediastinal lymphadenopathy CT chest 12/13/2017- enlargement of right upper lobe nodule, other lung nodules and chest lymph node stable PET 01/09/2018- right upper lobe nodule and right lower lobe nodule have enlarged since 2018 and have low level metabolic activity increased size of an 8 mm right upper lobe nodule without increased metabolic activity, stable activity and a mildly enlarged lower paratracheal node, no evidence of metastatic disease in the abdomen or pelvis CT chest 04/22/2018- no new lung nodules, dominant right lung nodules slightly increased in size Bronchoscopy/EBUS 05/04/2019 with biopsy of level 7 node, biopsy of right lower lobe nodule, FNA of right lower lobe nodule-negative CT biopsy of right upper lobe nodule 06/07/2018- adenocarcinoma with lepidic and acinar patterns SBRT to the dominant right upper lobe nodule 07/23/2018-07/30/2018 CT 09/16/2018-slight increase in size of medial right upper lobe nodule-possibly secondary to interval radiation, other nodules are unchanged CT 12/25/2018- medial right upper lobe nodule has decreased in size slightly, multiple additional solid and groundglass nodules are stable CT 06/09/2019-enlarging right lower lobe nodule, enlarging subsolid  nodule, new right upper lobe nodule SBRT to right lower lobe lung nodule, 3 fractions 07/08/2019-07/14/2019 CT 12/05/2019-dominant right lower lobe lesion stable to slightly decreased in size, progressive solid component associated with a dominant right upper lung nodule, mild progression of a left upper lobe nodule, new subsolid anterior medial left upper lobe nodule Cycle 1 Alimta/carboplatin/pembrolizumab 02/04/2020 Cycle 2 Alimta/carboplatin/pembrolizumab 02/23/2020 Cycle 3 Alimta/carboplatin/pembrolizumab 03/15/2020 Cycle 4 Alimta/carboplatin/pembrolizumab 04/05/2020 (Alimta and carboplatin dose reduced secondary to anemia) CT chest 04/12/2020-decreased size of solid right lower lobe and right upper lobe nodules, groundglass nodules in the left lung unchanged Cycle 5 Alimta/pembrolizumab 05/03/2020 Cycle 6 Alimta/pembrolizumab 05/21/2020 Cycle 7 Alimta/pembrolizumab 06/14/2020 Cycle 8 Alimta/pembrolizumab 07/05/2020 CT chest 07/22/2020-stable nodules, no evidence of disease progression, unchanged multifocal groundglass nodules suspicious for multifocal adenocarcinoma. Cycle 9 Alimta/Pembrolizumab 07/26/2020 Cycle 10 Alimta/Pembrolizumab 08/16/2020 Cycle 11 Alimta/Pembrolizumab 09/06/2020 Cycle 12 Alimta/pembrolizumab 09/27/2020 Cycle 13 Alimta/pembrolizumab 10/18/2020 CT chest 11/12/2020-enlarging soft tissue density right upper lobe medially adjacent to the mediastinum measuring 2.7 x 2.1 cm, concerning for recurrent tumor.  Stable radiation changes involving the right upper lobe and right lower lobe.  Stable scattered small solid and subsolid pulmonary nodules bilaterally.  New right-sided pleural effusion.  Stable borderline enlarged mediastinal lymph nodes. Bronchoscopy 12/28/2020-right upper lobe and left lower lobe lesions biopsy-negative pathology PET 07/13/6576-ION metabolic activity involving the right suprahilar consolidative process-favored benign, measured smaller, additional groundglass densities with  very low-level metabolic activity, radiation change in the right lower lobe, resolution of right pleural effusion, no distant metastatic disease Cycle 14 Alimta/pembrolizumab 01/19/2021 Cycle 15 Alimta/pembrolizumab 02/07/2021 Cycle 16 of Alimta/pembrolizumab 02/28/2021 Cycle 17 Alimta/pembrolizumab 03/28/2021 CT chest 04/18/2021-increased areas of masslike density in the right upper lobe and right lower lobe with a new right lower lobe satellite nodule, other groundglass attenuation lesions are stable, slight decrease in size of chronic right pleural effusion 5.  Baker's cyst left  leg, pain and swelling in the left calf, negative Doppler 01/24/2020 and 01/29/2020-evaluated by orthopedics and underwent aspiration of the cyst 01/29/2000, completed a course of Keflex 6.  Anemia secondary to chemotherapy, status post 2 units of packed red blood cells on 04/10/2020-improved 7.  Port-A-Cath placement 01/12/2021; Port-A-Cath removal 02/24/2021 due to infection, culture positive for MRSA, status post 2 courses of doxycycline, Bactrim DS started 03/28/2021      Disposition: Ms. Baird appears stable.  She was last treated with Alimta/pembrolizumab 03/28/2021.  I reviewed the restaging CT results with her.  I reviewed the images (she did not wish to see the images today).  2 dominant right lung densities have increased in size consistent with disease progression.  We discussed treatment options.  I recommend discontinuing Alimta/pembrolizumab.  I recommend proceeding with sotorasib treatment on the SWOG S1900E lung map study.  She agrees.  We will asked the research nurse to contact her today.  The right chest Port-A-Cath site continues to have evidence of active infection.  I will make referral to the infectious disease service.  Ms. Levenhagen will return for an office visit in 1 week.  Betsy Coder, MD  04/22/2021  9:08 AM

## 2021-04-22 NOTE — Telephone Encounter (Signed)
Received voicemail from [place] at Dr. Gearldine Shown office notifying us of urgent referral placed for patient. Routing to referral coordinator.   Manuela Schwartz direct line: [phone] Dr. Gearldine Shown office: [phone]  Beryle Flock, RN

## 2021-04-22 NOTE — Telephone Encounter (Signed)
MD placed "urgent" referral for patient to see Infectious Disease. Infected port removed, cultured as MRSA and still looks infected after doxy and bactrim.  Left message on ID voice mail requesting appointment asap. All records in Epic>

## 2021-04-24 ENCOUNTER — Other Ambulatory Visit: Payer: Self-pay

## 2021-04-26 ENCOUNTER — Ambulatory Visit (INDEPENDENT_AMBULATORY_CARE_PROVIDER_SITE_OTHER): Payer: BC Managed Care – PPO | Admitting: Infectious Diseases

## 2021-04-26 ENCOUNTER — Telehealth: Payer: Self-pay | Admitting: Medical Oncology

## 2021-04-26 ENCOUNTER — Other Ambulatory Visit: Payer: Self-pay

## 2021-04-26 ENCOUNTER — Encounter: Payer: Self-pay | Admitting: Infectious Diseases

## 2021-04-26 DIAGNOSIS — T80212A Local infection due to central venous catheter, initial encounter: Secondary | ICD-10-CM | POA: Diagnosis not present

## 2021-04-26 MED ORDER — SULFAMETHOXAZOLE-TRIMETHOPRIM 800-160 MG PO TABS: 1.0000 | ORAL_TABLET | Freq: Two times a day (BID) | ORAL | 0 refills | Status: AC

## 2021-04-26 MED ORDER — MUPIROCIN CALCIUM 2 % EX CREA: 1.0000 "application " | TOPICAL_CREAM | Freq: Two times a day (BID) | CUTANEOUS | 0 refills | Status: DC

## 2021-04-26 NOTE — Patient Instructions (Addendum)
I think we can get this to heal for you better now that the port is out.   I feel strongly that's why you did not have any complete improvement until that was removed as MRSA is painfully sticky and often infections cannot be cured until the metal/plastic is out.   You can use the mupirocin topically 2x a day until it closes up to avoid it from being too dry.   Wash with gentle soap and water, pat dry. Can cover if it is draining or clothing irritates it to help it heal.   Allevyn foam dressing with silicone boarder may be more comfortable for you to cover the site vs dry gauze. You can use them for a few days as long as there is no drainage on them. They are very gentle on the skin.   Please schedule a follow up virtual visit in 2 weeks with Dr. Tommy Medal (preferred) or one of our other doctors her to ensure our plan is working.   Please let me know if you have any unexpected trouble between now and your return visit.

## 2021-04-26 NOTE — Telephone Encounter (Signed)
LUNGMAP: A MASTER PROTOCOL TO EVALUATE BIOMARKER-DRIVEN THERAPIES AND IMMUNOTHERAPIES IN PREVIOUSLY-TREATED NON-SMALL CELL LUNG CANCER (Lung-MAP Screening Study)   Sub-study: S1900E, A PHASE II STUDY OF SOTORASIB (AMG 510) IN PARTICIPANTS WITH PREVIOUSLY  TREATED STAGE IV OR RECURRENT KRAS G12C MUTATED NON-SQUAMOUS NON-SMALL CELL  LUNG CANCER (ECOG-ACRIN LUNG-MAP SUB-STUDY   Outgoing call: Spoke with patient regarding study. Informed patient of the two step process and registration for this study. Patient and I made plans to meet this Friday morning,after her appointment with Dr. Benay Spice. Inquired with patient if we could meet prior to her appointment, patient preferred to meet after for reviewing of consent and signing, should she wish to proceed. Informed patient that I will email her the two consent forms for her review so she has time to read over what this study entails. Patient denies further questions at this time. Patient thanked for her time and encouraged to call with questions.  Return contact information provided.  Consent forms emailed to her confirmed email address.  Maxwell Marion, RN, BSN, [facility] Clinical Research Nurse Lead 04/26/2021 4:50 PM

## 2021-04-26 NOTE — Progress Notes (Signed)
Patient: Gina Cervantes  DOB: 1960/10/16 MRN: [id number] PCP: Ladell Pier, MD    Chief Complaint  Patient presents with   New Patient (Initial Visit)    MRSA      Subjective:  Gina Cervantes is a 60 y.o. referred here by Dr. Benay Spice for assistance in treatment.   Gina Cervantes is currently working with Dr. Benay Spice in treating non-small cell lung cancer. She is currently receiving cycle of Alimata/pembrolizumab (last dose 03/28/2021).   For treatment she had a Rt chest port-a-cath placed on 01/12/2021. She tells me that the port was placed in an area where she had previously had a lot of radiation for breast cancer and the skin is tough.  She said it never really felt quite right - experienced tenderness, induration and erythema to the chest (pictures in Epic media tab and pulled into today's visit). In the photos she had tracking edema downward and a central blister below the incision. She also mentions she had some scabs that were present on surgical incision that came off and had drainage. She was treated with 2 courses of Doxycycline (7-d 100 mg BID) prior to the port being removed on 10-20 at the guidance of IR team. She came back for follow up on 11/04 with improved but ongoing erythema, induration and scabs. Previous blisters at that time had ruptured but she had a small fluid collection on the lateral aspect of her wound which was aspirated and sent for culture. This returned with rare growth of MRSA (S- tetracycline, Trimeth/sulfa, R-clinda, erythro). Given 7-d course of Bactrim 1 DS. She says that the bactrim seemed to be working for the infection but required another 10-d course on 11/21 after it opened up again and redness/swelling started coming back. She states that round of antibiotics seemed to help the most up until recently when she turned over during rest and it opened again with white/yellow drainage coming out. Since then she has typically kept it covered with dry gauze  dressing when she has clothing on or is out and about.   Throughout this illness she has never had any fevers/chills or signs of systemic infection. She has tolerated all oral antibiotics well without any side effects from what she has noticed. Drainage on the dressing if present is thin yellow.    Review of Systems  Constitutional:  Negative for chills, fever, malaise/fatigue and weight loss.  HENT:  Negative for sore throat.        No dental problems  Respiratory:  Negative for cough and sputum production.   Cardiovascular:  Negative for chest pain and leg swelling.  Gastrointestinal:  Negative for abdominal pain, diarrhea and vomiting.  Genitourinary:  Negative for dysuria and flank pain.  Musculoskeletal:  Negative for joint pain, myalgias and neck pain.  Skin:  Positive for rash.       Wound to Rt chest   Neurological:  Negative for dizziness, tingling and headaches.  Psychiatric/Behavioral:  Negative for depression and substance abuse. The patient is not nervous/anxious and does not have insomnia.     Past Medical History:  Diagnosis Date   Anal cancer ([place]) 08/09/2012   Squamous cell   Anxiety    PANIC ATTACKS   Arthritis    Breast cancer ([place]) 2009   ER+PR+HER-2+   Ductal carcinoma ([place]) 02/2008   invasive    Heart palpitations    History of radiation therapy 09/02/12-10/10/12   anal 50.4Gy   History of shingles 10/2014  HPV (human papilloma virus) anogenital infection    vulvar/ freezing hx   Hx of radiation therapy 2020   Lung nodule    Malignant neoplasm of upper lobe of left lung ([place]) 06/2016   Personal history of chemotherapy    Personal history of radiation therapy 2010   Pneumonia    NO RECENT PROBLEMS   PONV (postoperative nausea and vomiting)    Hypotension   Radiation 10/21/08-12/08/08   Right breast 6240 cGy   Shingles 2017   Status post chemotherapy 02/2008   Taxol/Herceptin    Outpatient Medications Prior to Visit  Medication Sig Dispense Refill    albuterol (VENTOLIN HFA) 108 (90 Base) MCG/ACT inhaler TAKE 2 PUFFS BY MOUTH EVERY 6 HOURS AS NEEDED FOR WHEEZE OR SHORTNESS OF BREATH 18 each 2   Budeson-Glycopyrrol-Formoterol (BREZTRI AEROSPHERE) 160-9-4.8 MCG/ACT AERO Inhale 2 puffs into the lungs in the morning and at bedtime. 10.7 g 0   CALCIUM PO Take 1,200 mg by mouth daily. D3     Cyanocobalamin (VITAMIN B-12 PO) Take 2,500 mcg by mouth daily. Gummies-does not know dose     folic acid (FOLVITE) 1 MG tablet Take 1 tablet (1 mg total) by mouth daily. 90 tablet 1   loratadine (CLARITIN) 10 MG tablet Take 10 mg by mouth as needed for allergies.     LORazepam (ATIVAN) 0.5 MG tablet Take 1 tablet (0.5 mg total) by mouth every 8 (eight) hours as needed for anxiety (or nausea). 60 tablet 0   Multiple Vitamins-Minerals (MULTIVITAMIN WITH MINERALS) tablet Take 1 tablet by mouth daily.     POTASSIUM CHLORIDE PO Take 550 mg by mouth daily.     Thiamine HCl (VITAMIN B-1 PO) Take 100 mg by mouth daily.     sulfamethoxazole-trimethoprim (BACTRIM DS) 800-160 MG tablet Take 1 tablet by mouth 2 (two) times daily. (Patient not taking: Reported on 04/26/2021) 20 tablet 0   No facility-administered medications prior to visit.     Allergies  Allergen Reactions   No Known Allergies     Social History   Tobacco Use   Smoking status: Former    Years: 8.00    Types: Cigarettes    Quit date: 2015    Years since quitting: 7.9   Smokeless tobacco: Never   Tobacco comments:    stopped smoking cigarettes Jan. 2018  Vaping Use   Vaping Use: Never used  Substance Use Topics   Alcohol use: Not Currently    Alcohol/week: 0.0 - 5.0 standard drinks    Comment: every day   Drug use: No    Family History  Problem Relation Age of Onset   Lung cancer Father    Stroke Mother    Breast cancer Neg Hx     Objective:   Vitals:   04/26/21 1449  BP: 124/76  Pulse: 96  Temp: 98 F (36.7 C)  TempSrc: Oral  Weight: 167 lb (75.8 kg)   Body mass  index is 27.79 kg/m.  Physical Exam Vitals reviewed.  Constitutional:      Appearance: She is well-developed.     Comments: Seated comfortably in chair.   HENT:     Mouth/Throat:     Mouth: No oral lesions.     Dentition: Normal dentition. No dental abscesses.     Pharynx: No oropharyngeal exudate.  Cardiovascular:     Rate and Rhythm: Normal rate and regular rhythm.     Heart sounds: Normal heart sounds.  Pulmonary:  Effort: Pulmonary effort is normal.     Breath sounds: Normal breath sounds.  Abdominal:     General: There is no distension.     Palpations: Abdomen is soft.     Tenderness: There is no abdominal tenderness.  Lymphadenopathy:     Cervical: No cervical adenopathy.  Skin:    General: Skin is warm and dry.     Findings: No rash.     Comments: Rt chest photographed below - compared to previous photos the erythema has receded. There is no expressible drainage to the site with palpation over the area and surrounding induration. There is some flaking to the skin surrounding a central open area with a very scant spot of thin yellow serous drainage to the dressing gauze. There is no significant pain but does report some discomfort with palpation.   Neurological:     Mental Status: She is alert and oriented to person, place, and time.  Psychiatric:        Judgment: Judgment normal.     Comments: In good spirits today and engaged in care discussion    02/21/2021 Rt Chest with expanded erythema, blisters and scabs to incision   Pictured a few days later from above as erythema progressed:     04/26/2021 Rt chest:    Lab Results: Lab Results  Component Value Date   WBC 5.3 03/28/2021   HGB 12.8 03/28/2021   HCT 38.9 03/28/2021   MCV 113.7 (H) 03/28/2021   PLT 322 03/28/2021    Lab Results  Component Value Date   CREATININE 0.90 03/28/2021   BUN 18 03/28/2021   NA 136 03/28/2021   K 4.3 03/28/2021   CL 96 (L) 03/28/2021   CO2 30 03/28/2021    Lab  Results  Component Value Date   ALT 19 03/28/2021   AST 25 03/28/2021   ALKPHOS 73 03/28/2021   BILITOT 0.6 03/28/2021     Assessment & Plan:   Problem List Items Addressed This Visit       Unprioritized   Local infection due to Port-A-Cath    Port-a-cath placement with early evidence of infection complication now s/p removal. Explained likely failed antibiotics the first 2 rounds of antibiotics because of the attempt to treat through retained port. In review of IR records does not appear that she had much by the way of gross infection/purulence at the time of removal after sequential courses of appropriate antibiotics that were just not enough to achieve cure without device extraction.  Overall she seems to have improved on Bactrim. Low suspicion for new pathogen and would continue to target MRSA. Recent CT of the chest reviewed from 12/13 and no mention of soft tissue abscess. I don't feel strongly we need to obtain further imaging today to investigate for abscess as there is no fluctuance on exam.    We discussed the possibility of single dose dalbavancin IV vs resuming effective oral therapy for a longer course with local wound care. She would like to avoid IV if possible, which I am comfortable with.   Previously radiated skin also likely contributing to delay in healing - Will have her stop the topical neosporin (many people can be allergic to this and with evidence of some dermatitis over the site I wonder if this may be contributing a bit to non-healing site). Can use topical mupirocin cream BID if noted to be too dry. I also gave her an allevyn silicone foam dressing to help site heal. Can use  up to 5 days as long as not soiled.   Will arrange a virtual follow up in 2 weeks with ID MD to ensure we are moving in the right direction. She will convert to in person visit if needed.       Relevant Medications   sulfamethoxazole-trimethoprim (BACTRIM DS) 800-160 MG tablet   mupirocin  cream (BACTROBAN) 2 %   As part of encounter - records reviewed by multiple providers between IR / Onc from 10/20 - 12/13. Independent review of Imaging reports and previous lab data.    [name], MSN, NP-C [facility] [place] Pager: [phone] Office: [phone]  04/27/21  10:02 AM

## 2021-04-27 ENCOUNTER — Encounter: Payer: Self-pay | Admitting: Infectious Diseases

## 2021-04-27 DIAGNOSIS — T80212A Local infection due to central venous catheter, initial encounter: Secondary | ICD-10-CM | POA: Insufficient documentation

## 2021-04-27 NOTE — Assessment & Plan Note (Addendum)
Port-a-cath placement with early evidence of infection complication now s/p removal. Explained likely failed antibiotics the first 2 rounds of antibiotics because of the attempt to treat through retained port. In review of IR records does not appear that she had much by the way of gross infection/purulence at the time of removal after sequential courses of appropriate antibiotics that were just not enough to achieve cure without device extraction.  Overall she seems to have improved on Bactrim. Low suspicion for new pathogen and would continue to target MRSA. Recent CT of the chest reviewed from 12/13 and no mention of soft tissue abscess. I don't feel strongly we need to obtain further imaging today to investigate for abscess as there is no fluctuance on exam.    We discussed the possibility of single dose dalbavancin IV vs resuming effective oral therapy for a longer course with local wound care. She would like to avoid IV if possible, which I am comfortable with.   Previously radiated skin also likely contributing to delay in healing - Will have her stop the topical neosporin (many people can be allergic to this and with evidence of some dermatitis over the site I wonder if this may be contributing a bit to non-healing site). Can use topical mupirocin cream BID if noted to be too dry. I also gave her an allevyn silicone foam dressing to help site heal. Can use up to 5 days as long as not soiled.   Will arrange a virtual follow up in 2 weeks with ID MD to ensure we are moving in the right direction. She will convert to in person visit if needed.

## 2021-04-29 ENCOUNTER — Other Ambulatory Visit: Payer: Self-pay

## 2021-04-29 ENCOUNTER — Inpatient Hospital Stay (HOSPITAL_BASED_OUTPATIENT_CLINIC_OR_DEPARTMENT_OTHER): Payer: BC Managed Care – PPO | Admitting: Oncology

## 2021-04-29 ENCOUNTER — Inpatient Hospital Stay: Payer: BC Managed Care – PPO

## 2021-04-29 VITALS — BP 127/66 | HR 100 | Temp 98.1°F | Resp 18 | Ht 65.0 in | Wt 163.8 lb

## 2021-04-29 DIAGNOSIS — Z85048 Personal history of other malignant neoplasm of rectum, rectosigmoid junction, and anus: Secondary | ICD-10-CM | POA: Diagnosis not present

## 2021-04-29 DIAGNOSIS — C3490 Malignant neoplasm of unspecified part of unspecified bronchus or lung: Secondary | ICD-10-CM

## 2021-04-29 DIAGNOSIS — C3412 Malignant neoplasm of upper lobe, left bronchus or lung: Secondary | ICD-10-CM

## 2021-04-29 DIAGNOSIS — C3431 Malignant neoplasm of lower lobe, right bronchus or lung: Secondary | ICD-10-CM | POA: Diagnosis not present

## 2021-04-29 DIAGNOSIS — Z923 Personal history of irradiation: Secondary | ICD-10-CM | POA: Diagnosis not present

## 2021-04-29 DIAGNOSIS — Z9221 Personal history of antineoplastic chemotherapy: Secondary | ICD-10-CM | POA: Diagnosis not present

## 2021-04-29 DIAGNOSIS — Z853 Personal history of malignant neoplasm of breast: Secondary | ICD-10-CM | POA: Diagnosis not present

## 2021-04-29 LAB — CMP (CANCER CENTER ONLY)
ALT: 18 U/L (ref 0–44)
AST: 23 U/L (ref 15–41)
Albumin: 4.2 g/dL (ref 3.5–5.0)
Alkaline Phosphatase: 69 U/L (ref 38–126)
Anion gap: 7 (ref 5–15)
BUN: 22 mg/dL — ABNORMAL HIGH (ref 6–20)
CO2: 31 mmol/L (ref 22–32)
Calcium: 9.7 mg/dL (ref 8.9–10.3)
Chloride: 97 mmol/L — ABNORMAL LOW (ref 98–111)
Creatinine: 1.15 mg/dL — ABNORMAL HIGH (ref 0.44–1.00)
GFR, Estimated: 55 mL/min — ABNORMAL LOW (ref >60)
Glucose, Bld: 111 mg/dL — ABNORMAL HIGH (ref 70–99)
Potassium: 4.5 mmol/L (ref 3.5–5.1)
Sodium: 135 mmol/L (ref 135–145)
Total Bilirubin: 0.4 mg/dL (ref 0.3–1.2)
Total Protein: 8.1 g/dL (ref 6.5–8.1)

## 2021-04-29 LAB — CBC WITH DIFFERENTIAL (CANCER CENTER ONLY)
Abs Immature Granulocytes: 0.02 10*3/uL (ref 0.00–0.07)
Basophils Absolute: 0.1 10*3/uL (ref 0.0–0.1)
Basophils Relative: 2 %
Eosinophils Absolute: 0.2 10*3/uL (ref 0.0–0.5)
Eosinophils Relative: 5 %
HCT: 37.7 % (ref 36.0–46.0)
Hemoglobin: 12.7 g/dL (ref 12.0–15.0)
Immature Granulocytes: 0 %
Lymphocytes Relative: 18 %
Lymphs Abs: 0.8 10*3/uL (ref 0.7–4.0)
MCH: 38.7 pg — ABNORMAL HIGH (ref 26.0–34.0)
MCHC: 33.7 g/dL (ref 30.0–36.0)
MCV: 114.9 fL — ABNORMAL HIGH (ref 80.0–100.0)
Monocytes Absolute: 0.8 10*3/uL (ref 0.1–1.0)
Monocytes Relative: 16 %
Neutro Abs: 2.8 10*3/uL (ref 1.7–7.7)
Neutrophils Relative %: 59 %
Platelet Count: 243 10*3/uL (ref 150–400)
RBC: 3.28 MIL/uL — ABNORMAL LOW (ref 3.87–5.11)
RDW: 13.1 % (ref 11.5–15.5)
WBC Count: 4.7 10*3/uL (ref 4.0–10.5)
nRBC: 0 % (ref 0.0–0.2)

## 2021-04-29 NOTE — Research (Signed)
Trial Name:  S1900E, A PHASE II STUDY OF SOTORASIB (AMG 510) IN PARTICIPANTS WITH PREVIOUSLY  TREATED STAGE IV OR RECURRENT KRAS G12C MUTATED NON-SQUAMOUS NON-SMALL  CELL LUNG CANCER (ECOG-ACRIN LUNG-MAP SUB-STUDY)   Patient Gina Cervantes was identified by Dr Benay Spice as a potential candidate for the above listed study.  This Clinical Research Nurse (with supervision by Otilio Miu, Clinical Research Lead RN) met with Gina Cervantes, [name] on 04/29/21 in a manner and location that ensures patient privacy to discuss participation in the above listed research study.  Patient is Unaccompanied (pt did not want her husband to come).  Patient was previously provided with informed consent documents.  Patient confirmed they have read the informed consent documents.  As outlined in the informed consent form, this Nurse and Gina Cervantes discussed the purpose of the research study, the investigational nature of the study, study procedures and requirements for study participation, potential risks and benefits of study participation, as well as alternatives to participation.  This study is not blinded or double-blinded. The patient understands participation is voluntary and they may withdraw from study participation at any time.  This study does not involve randomization.  Potential side effects were reviewed with patient as outlined in the consent form, and patient made aware there may be side effects not yet known. This study does not involve a placebo. Patient understands enrollment is pending full eligibility review.   Confidentiality and how the patient's information will be used as part of study participation were discussed.  Patient was informed there is not reimbursement provided for their time and effort spent on trial participation.  The patient is encouraged to discuss research study participation with their insurance provider to determine what costs they may incur as part of study  participation, including research related injury.    All questions were answered to patient's satisfaction.  The informed consent and separate HIPAA Authorization was reviewed page by page.  The patient's mental and emotional status is appropriate to provide informed consent, and the patient verbalizes an understanding of study participation.  Patient has agreed to participate in the above listed research study and has voluntarily signed the informed consent version date 10/22/20 ([place] active date 12/30/20). and separate HIPAA Authorization, version 5, date 04/23/19  on 04/29/21 at 11:20AM.  The patient was provided with a copy of the signed informed consent form and separate HIPAA Authorization for their reference.  No study specific procedures were obtained prior to the signing of the informed consent document.  Approximately 20 minutes were spent with the patient reviewing the informed consent documents.  After obtaining informed consent patient, voluntarily signed the optional Release of Information form for use throughout trial participation.  Marjie Skiff Aamari West, RN, BSN, [facility] She   Her   [facility] Clinical Research Nurse [facility] Direct Dial [phone]   Pager [phone] 04/29/2021 1:41 PM

## 2021-04-29 NOTE — Research (Signed)
Trial Name:  LUNGMAP: A MASTER PROTOCOL TO EVALUATE BIOMARKER-DRIVEN THERAPIES AND IMMUNOTHERAPIES IN PREVIOUSLY-TREATED NON-SMALL CELL LUNG CANCER (Lung-MAP Screening Study)   Patient Gina Cervantes was identified by Dr Benay Spice as a potential candidate for the above listed study.  This Clinical Research Nurse (with RN Otilio Miu supervision) met with [name], [id number] on 04/29/21 in a manner and location that ensures patient privacy to discuss participation in the above listed research study.  Patient is Unaccompanied, as she preferred her husband not accompany her.  Patient was previously provided with informed consent documents via email by Rubin Payor.  Patient confirmed they have read the informed consent documents.  As outlined in the informed consent form, this Nurse and Susann Givens Egley discussed the purpose of the research study, the investigational nature of the study, study procedures and requirements for study participation, potential risks and benefits of study participation, as well as alternatives to participation.  This study is not blinded or double-blinded. The patient understands participation is voluntary and they may withdraw from study participation at any time.  This study does not involve randomization.  Potential side effects were reviewed with patient as outlined in the consent form, and patient made aware there may be side effects not yet known. This study does not involve a placebo. Patient understands enrollment is pending full eligibility review.   Confidentiality and how the patient's information will be used as part of study participation were discussed.  Patient was informed there is not reimbursement provided for their time and effort spent on trial participation.  The patient is encouraged to discuss research study participation with their insurance provider to determine what costs they may incur as part of study participation, including research related  injury.    All questions were answered to patient's satisfaction.  The informed consent and separate HIPAA Authorization was reviewed page by page.  The patient's mental and emotional status is appropriate to provide informed consent, and the patient verbalizes an understanding of study participation.  Patient has agreed to participate in the above listed research study and has voluntarily signed the informed consent version date 08/06/20 and separate HIPAA Authorization, version date 04/05/17  on 04/29/21 at 1120AM.  The patient was provided with a copy of the signed informed consent form and separate HIPAA Authorization for their reference.  No study specific procedures were obtained prior to the signing of the informed consent document.  Approximately 20 minutes were spent with the patient reviewing the informed consent documents.  After obtaining informed consent patient, voluntarily signed the optional Release of Information form for use throughout trial participation.  Marjie Skiff Jarius Dieudonne, RN, BSN, [facility] She   Her   [facility] Clinical Research Nurse [facility] Direct Dial [phone]   Pager [phone] 04/29/2021 1:20 PM

## 2021-04-29 NOTE — Progress Notes (Signed)
Gina Cervantes OFFICE PROGRESS NOTE   Diagnosis: Non-small cell lung cancer  INTERVAL HISTORY:   Gina Cervantes returns as scheduled.  She was seen in the infectious disease clinic 04/26/2021.  She resumed treatment with Bactrim.  She reports improvement in the Port-A-Cath site skin infection.  She otherwise feels well.  Objective:  Vital signs in last 24 hours:  Blood pressure 127/66, pulse 100, temperature 98.1 F (36.7 C), temperature source Oral, resp. rate 18, height '5\' 5"'  (1.651 m), weight 163 lb 12.8 oz (74.3 kg), last menstrual period 04/07/2008, SpO2 99 %.   Resp: Lungs clear bilaterally Cardio: Regular rate and rhythm GI: No hepatosplenomegaly Vascular: Trace edema of the left lower leg  Skin: Mild erythema/induration surrounding the right upper chest Port-A-Cath site.  No fluctuance.  Lab Results:  Lab Results  Component Value Date   WBC 4.7 04/29/2021   HGB 12.7 04/29/2021   HCT 37.7 04/29/2021   MCV 114.9 (H) 04/29/2021   PLT 243 04/29/2021   NEUTROABS 2.8 04/29/2021    CMP  Lab Results  Component Value Date   NA 135 04/29/2021   K 4.5 04/29/2021   CL 97 (L) 04/29/2021   CO2 31 04/29/2021   GLUCOSE 111 (H) 04/29/2021   BUN 22 (H) 04/29/2021   CREATININE 1.15 (H) 04/29/2021   CALCIUM 9.7 04/29/2021   PROT 8.1 04/29/2021   ALBUMIN 4.2 04/29/2021   AST 23 04/29/2021   ALT 18 04/29/2021   ALKPHOS 69 04/29/2021   BILITOT 0.4 04/29/2021   GFRNONAA 55 (L) 04/29/2021   GFRAA >60 01/29/2020    Lab Results  Component Value Date   CEA 5.5 (H) 08/02/2012    Lab Results  Component Value Date   INR 1.04 06/07/2018   LABPROT 13.5 06/07/2018    Imaging:  No results found.  Medications: I have reviewed the patient's current medications.   Assessment/Plan:  Anal cancer-left anal margin/anal canal mass, status post a biopsy on 08/09/2012 confirming invasive moderately differentiated squamous cell carcinoma,p16 positive.   -Staging PET  scan 08/20/2012-hypermetabolic anal mass, no hypermetabolic metastases   -Initiation of concurrent radiation with cycle 1 of mitomycin C./5-fluorouracil 09/02/2012.   -She completed cycle 2 5-fluorouracil/mitomycin C. beginning 10/01/2012.   -She completed radiation 10/10/2012.   2. Right breast cancer 2009, ER positive, PR positive, HER-2 positive. She is status post neoadjuvant AC x4 cycles followed by Taxol/Herceptin. She underwent a right lumpectomy and sentinel lymph node biopsy 09/22/2008 with no evidence of residual invasive carcinoma and 5 negative sentinel lymph nodes. She then completed right breast radiation. She began tamoxifen 12/15/2008 and completed adjuvant Herceptin 05/25/2009. The tamoxifen was discontinued and she was switched to Arimidex in July 2013.  Arimidex discontinued in mid July 2019. 3. History of enlargement of the right tonsil.   4. Nonspecific pulmonary nodules without hypermetabolic activity on the PET scan 08/20/2012. Chest CT 10/21/2012 with scattered pulmonary nodules including nodules that were not present in 2010. Annual surveillance was recommended. Follow-up chest CT 10/24/2013 with scattered groundglass densities again noted in both lungs mostly unchanged when compared to the previous study. A sub-solid right lower lobe nodule had increased central density. The size was unchanged.The nodule has been present since 2009. CT chest 06/13/2016-a sub-solid 9 mm right upper lobe nodule has enlarged compared to 2015, a right lower lobe irregular nodular density appears more well defined, new vague 5 mm groundglass left upper lobe nodule, left lower lobe 10 mm groundglass nodule is more apparent, posterior  left upper lobe circumscribed solid nodule measures 11 x 10 mm and is new PET scan 06/22/2016-malignant range activity involving the 11 mm left upper lobe nodule, no other hypermetabolic nodules, suspicious sub-solid right lower lobe nodule Wedge resection of a  hypermetabolic left upper lobe nodule on 07/05/2016-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 No EGFR, ALK, BRAF, RET, ERBB2, or ROS1 alteration CT chest 11/14/2016-status post left upper lobe wedge resection, stable right lung nodules Chest CT 05/02/2017-stable bilateral solid and groundglass nodules, stable mild mediastinal lymphadenopathy CT chest 12/13/2017- enlargement of right upper lobe nodule, other lung nodules and chest lymph node stable PET 01/09/2018- right upper lobe nodule and right lower lobe nodule have enlarged since 2018 and have low level metabolic activity increased size of an 8 mm right upper lobe nodule without increased metabolic activity, stable activity and a mildly enlarged lower paratracheal node, no evidence of metastatic disease in the abdomen or pelvis CT chest 04/22/2018- no new lung nodules, dominant right lung nodules slightly increased in size Bronchoscopy/EBUS 05/04/2019 with biopsy of level 7 node, biopsy of right lower lobe nodule, FNA of right lower lobe nodule-negative CT biopsy of right upper lobe nodule 06/07/2018- adenocarcinoma with lepidic and acinar patterns SBRT to the dominant right upper lobe nodule 07/23/2018-07/30/2018 CT 09/16/2018-slight increase in size of medial right upper lobe nodule-possibly secondary to interval radiation, other nodules are unchanged CT 12/25/2018- medial right upper lobe nodule has decreased in size slightly, multiple additional solid and groundglass nodules are stable CT 06/09/2019-enlarging right lower lobe nodule, enlarging subsolid nodule, new right upper lobe nodule SBRT to right lower lobe lung nodule, 3 fractions 07/08/2019-07/14/2019 CT 12/05/2019-dominant right lower lobe lesion stable to slightly decreased in size, progressive solid component associated with a dominant right upper lung nodule, mild  progression of a left upper lobe nodule, new subsolid anterior medial left upper lobe nodule Cycle 1 Alimta/carboplatin/pembrolizumab 02/04/2020 Cycle 2 Alimta/carboplatin/pembrolizumab 02/23/2020 Cycle 3 Alimta/carboplatin/pembrolizumab 03/15/2020 Cycle 4 Alimta/carboplatin/pembrolizumab 04/05/2020 (Alimta and carboplatin dose reduced secondary to anemia) CT chest 04/12/2020-decreased size of solid right lower lobe and right upper lobe nodules, groundglass nodules in the left lung unchanged Cycle 5 Alimta/pembrolizumab 05/03/2020 Cycle 6 Alimta/pembrolizumab 05/21/2020 Cycle 7 Alimta/pembrolizumab 06/14/2020 Cycle 8 Alimta/pembrolizumab 07/05/2020 CT chest 07/22/2020-stable nodules, no evidence of disease progression, unchanged multifocal groundglass nodules suspicious for multifocal adenocarcinoma. Cycle 9 Alimta/Pembrolizumab 07/26/2020 Cycle 10 Alimta/Pembrolizumab 08/16/2020 Cycle 11 Alimta/Pembrolizumab 09/06/2020 Cycle 12 Alimta/pembrolizumab 09/27/2020 Cycle 13 Alimta/pembrolizumab 10/18/2020 CT chest 11/12/2020-enlarging soft tissue density right upper lobe medially adjacent to the mediastinum measuring 2.7 x 2.1 cm, concerning for recurrent tumor.  Stable radiation changes involving the right upper lobe and right lower lobe.  Stable scattered small solid and subsolid pulmonary nodules bilaterally.  New right-sided pleural effusion.  Stable borderline enlarged mediastinal lymph nodes. Bronchoscopy 12/28/2020-right upper lobe and left lower lobe lesions biopsy-negative pathology PET 4/82/5003-BCW metabolic activity involving the right suprahilar consolidative process-favored benign, measured smaller, additional groundglass densities with very low-level metabolic activity, radiation change in the right lower lobe, resolution of right pleural effusion, no distant metastatic disease Cycle 14 Alimta/pembrolizumab 01/19/2021 Cycle 15 Alimta/pembrolizumab 02/07/2021 Cycle 16 of Alimta/pembrolizumab  02/28/2021 Cycle 17 Alimta/pembrolizumab 03/28/2021 CT chest 04/18/2021-increased areas of masslike density in the right upper lobe and right lower lobe with a new right lower lobe satellite nodule, other groundglass attenuation lesions are stable, slight decrease in size of chronic right pleural effusion 5.  Baker's cyst left leg, pain and swelling in the left calf, negative Doppler 01/24/2020 and 01/29/2020-evaluated by orthopedics and underwent aspiration of the cyst 01/29/2000, completed a course of Keflex 6.  Anemia secondary to chemotherapy, status post 2 units of packed red blood cells on 04/10/2020-improved 7.  Port-A-Cath placement 01/12/2021; Port-A-Cath removal 02/24/2021 due to infection, culture positive for MRSA, status post 2 courses of doxycycline, Bactrim DS started 03/28/2021, Bactrim DS resumed 04/26/2021     Disposition: Gina Cervantes has non-small cell lung cancer.  She has stage IV disease based on bilateral lung nodules.  She plans to enroll on the lung map study.  She is scheduled to meet with the research nurse for study consent today.  She will be referred for a brain CT as part of the study eligibility criteria.  She will continue the course of Bactrim as prescribed by the infectious disease service.  Her ECOG performance status is measured at 0 today.  Betsy Coder, MD  04/29/2021  11:30 AM

## 2021-05-03 ENCOUNTER — Telehealth: Payer: Self-pay | Admitting: *Deleted

## 2021-05-03 ENCOUNTER — Ambulatory Visit: Payer: BC Managed Care – PPO | Admitting: Internal Medicine

## 2021-05-03 NOTE — Telephone Encounter (Signed)
Discussed w/patient Dr. Gearldine Shown advice that she should work toward starting the study drug Sotorasib sooner than later. Need to begin within 28 days of her CT chest or will need to repeat the work up to qualify for study. He feels that if she develops any significant side effects these can be managed prior to her son visiting late January. She reports that family discussion over the weekend came to this same conclusion and she agrees to press onward. Scheduled CT head for 12/30 at 2:15/2:30 pm with no prep needed at [facility] radiology department. She was made aware via VM.

## 2021-05-06 ENCOUNTER — Other Ambulatory Visit: Payer: Self-pay

## 2021-05-06 ENCOUNTER — Encounter: Payer: Self-pay | Admitting: Medical Oncology

## 2021-05-06 ENCOUNTER — Ambulatory Visit (HOSPITAL_BASED_OUTPATIENT_CLINIC_OR_DEPARTMENT_OTHER)
Admission: RE | Admit: 2021-05-06 | Discharge: 2021-05-06 | Disposition: A | Discharge status: Home / Self Care | Payer: BC Managed Care – PPO | Source: Ambulatory Visit | Attending: Oncology | Admitting: Oncology

## 2021-05-06 DIAGNOSIS — C3490 Malignant neoplasm of unspecified part of unspecified bronchus or lung: Secondary | ICD-10-CM

## 2021-05-06 MED ORDER — IOHEXOL 300 MG/ML  SOLN
100.0000 mL | Freq: Once | INTRAMUSCULAR | Status: AC | PRN
  Administered 2021-05-06: 15:00:00 75 mL via INTRAVENOUS

## 2021-05-06 NOTE — Research (Signed)
Late entry for 04/28/2021: LungMAP Second Eligibility Review   This Nurse has reviewed this patient's inclusion and exclusion criteria as a second review and confirms Gina Cervantes is eligible for study participation.  Patient may continue with enrollment.   Jeral Fruit, RN 05/06/21 2:31 PM

## 2021-05-06 NOTE — Progress Notes (Signed)
LUNGMAP: A MASTER PROTOCOL TO EVALUATE BIOMARKER-DRIVEN THERAPIES AND IMMUNOTHERAPIES IN PREVIOUSLY-TREATED NON-SMALL CELL LUNG CANCER (Lung-MAP Screening Study)   Sub-study: S1900E, A PHASE II STUDY OF SOTORASIB (AMG 510) IN PARTICIPANTS WITH PREVIOUSLY  TREATED STAGE IV OR RECURRENT KRAS G12C MUTATED NON-SQUAMOUS NON-SMALL CELL  LUNG CANCER (ECOG-ACRIN LUNG-MAP SUB-STUDY  Late entry: 05/06/2021 This research nurse was present during patient's consenting appointment with research nurse [facility]. I reviewed with patient questions related to study enrollment after patient was consented to both studies. Patient states that she is a former smoker, no smoking for 1 year or more (per study description). Patient confirms that she has no gastrointestinal issues or concerns, no difficulty with swallowing, denies pain and states that she has no ongoing issues, that she could think of. Patient's current medication list reviewed and patient states she is not taking cyanocobalamin. It was explained to the patient, with her consent signing, that her [employer] result report, 27 May 2018,  will need to be submitted to the study for reanalysis of KRAS mutation confirmation and that confirmation, from study, may take up to two weeks. Patient gave her verbal understanding to this. Dr. Benay Spice noted patient's ECOG score to be "0" in his office note (04/29/2021). Patient is documented as postmenopausal, since 04/08/2008. Patient was informed that she may need to have a repeat CT scan, because hers was done on 04/18/2021 and it may be out of window before we can get a confirmation from [facility] for enrollment to sub-study S1900E. Patient was also informed that she would need a CT of the brain to assess for brain mets. Patient stated she understood and denied having further questions at this time. Patient was thanked for her time and interest in study and was provided with my contact information. Patient encouraged to call  Dr. Benay Spice or myself with questions or concerns.   Second eligibility review completed by research nurse Jeral Fruit, patient met eligibility requirements for registration to [facility] and she was registered on 05/04/2021.   Maxwell Marion, RN, BSN, [place] Clinical Research Nurse Lead 05/06/2021 2:21 PM

## 2021-05-10 ENCOUNTER — Inpatient Hospital Stay: Payer: BC Managed Care – PPO

## 2021-05-10 ENCOUNTER — Inpatient Hospital Stay: Payer: BC Managed Care – PPO | Admitting: Oncology

## 2021-05-10 ENCOUNTER — Other Ambulatory Visit: Payer: BC Managed Care – PPO

## 2021-05-16 ENCOUNTER — Encounter: Payer: Self-pay | Admitting: Infectious Diseases

## 2021-05-16 ENCOUNTER — Ambulatory Visit (INDEPENDENT_AMBULATORY_CARE_PROVIDER_SITE_OTHER): Payer: BC Managed Care – PPO | Admitting: Infectious Diseases

## 2021-05-16 ENCOUNTER — Other Ambulatory Visit: Payer: Self-pay

## 2021-05-16 ENCOUNTER — Encounter: Payer: Self-pay | Admitting: Oncology

## 2021-05-16 DIAGNOSIS — T80212A Local infection due to central venous catheter, initial encounter: Secondary | ICD-10-CM

## 2021-05-16 NOTE — Progress Notes (Signed)
Patient: Gina Cervantes  DOB: 28-May-1960 MRN: [id number] PCP: Gina Pier, MD    Chief Complaint  Patient presents with   Follow-up    Local infection due to Port-A-Cath, initial encounter      Subjective:  Gina Cervantes is a 61 y.o. referred here by Gina Cervantes for assistance in treatment of MRSA port infection / chest cellulitis.  She is back for 3 week follow up after completing topical mupirocin and longer course of bactrim for cellulitis to irradiated skin following removal of chest port. She has felt much better. No pain no drainage and skin has calmed down and healed over completely. She is very pleased as to how this has resolved.  ROS    Outpatient Medications Prior to Visit  Medication Sig Dispense Refill   albuterol (VENTOLIN HFA) 108 (90 Base) MCG/ACT inhaler TAKE 2 PUFFS BY MOUTH EVERY 6 HOURS AS NEEDED FOR WHEEZE OR SHORTNESS OF BREATH 18 each 2   Budeson-Glycopyrrol-Formoterol (BREZTRI AEROSPHERE) 160-9-4.8 MCG/ACT AERO Inhale 2 puffs into the lungs in the morning and at bedtime. 10.7 g 0   CALCIUM PO Take 1,200 mg by mouth daily. D3     Cyanocobalamin (VITAMIN B-12 PO) Take 2,500 mcg by mouth daily. Gummies-does not know dose     folic acid (FOLVITE) 1 MG tablet Take 1 tablet (1 mg total) by mouth daily. 90 tablet 1   loratadine (CLARITIN) 10 MG tablet Take 10 mg by mouth as needed for allergies.     LORazepam (ATIVAN) 0.5 MG tablet Take 1 tablet (0.5 mg total) by mouth every 8 (eight) hours as needed for anxiety (or nausea). 60 tablet 0   Multiple Vitamins-Minerals (MULTIVITAMIN WITH MINERALS) tablet Take 1 tablet by mouth daily.     mupirocin cream (BACTROBAN) 2 % Apply 1 application topically 2 (two) times daily. 15 g 0   POTASSIUM CHLORIDE PO Take 550 mg by mouth daily.     Thiamine HCl (VITAMIN B-1 PO) Take 100 mg by mouth daily.     No facility-administered medications prior to visit.     Allergies  Allergen Reactions   No Known  Allergies     Social History   Tobacco Use   Smoking status: Former    Years: 8.00    Types: Cigarettes    Quit date: 2015    Years since quitting: 8.0   Smokeless tobacco: Never   Tobacco comments:    stopped smoking cigarettes Jan. 2018  Vaping Use   Vaping Use: Never used  Substance Use Topics   Alcohol use: Not Currently    Alcohol/week: 0.0 - 5.0 standard drinks    Comment: every day   Drug use: No    Family History  Problem Relation Age of Onset   Lung cancer Father    Stroke Mother    Breast cancer Neg Hx     Objective:   Vitals:   05/16/21 1436  BP: 115/72  Pulse: 97  Temp: (!) 97.4 F (36.3 C)  TempSrc: Temporal  SpO2: 95%  Weight: 168 lb (76.2 kg)   Body mass index is 27.96 kg/m.  Physical Exam Vitals reviewed.  Constitutional:      Appearance: Normal appearance.  Skin:    Comments: Skin overlying right chest is completely healed. No blisters or dehiscence noted to previous incision sites. Skin has no flaking/dermatitis appearance. No findings concerning for cellulitis.   Neurological:     Mental Status: She is alert.  Assessment & Plan:   Problem List Items Addressed This Visit       Unprioritized   Local infection due to Port-A-Cath    Gina Cervantes has responded nicely to course of treatment with topical mupirocin and a little longer course of bactrim for MRSA related port infection and secondary cellulitis. I have no concerns for any residual infection after her exam today and seems that this has been cured.  She can follow up PRN with [place] here at [facility].        Gina Madeira, MSN, NP-C [facility] for Infectious [place] Pager: [phone] Office: [phone]  05/16/21  3:04 PM

## 2021-05-16 NOTE — Assessment & Plan Note (Addendum)
Gina Cervantes has responded nicely to course of treatment with topical mupirocin and a little longer course of bactrim for MRSA related port infection and secondary cellulitis. I have no concerns for any residual infection after her exam today and seems that this has been cured.  She can follow up PRN with [place] here at [facility].

## 2021-05-17 ENCOUNTER — Telehealth: Payer: Self-pay | Admitting: Medical Oncology

## 2021-05-17 ENCOUNTER — Encounter: Payer: Self-pay | Admitting: Oncology

## 2021-05-17 NOTE — Telephone Encounter (Signed)
LUNGMAP: A MASTER PROTOCOL TO EVALUATE BIOMARKER-DRIVEN THERAPIES AND IMMUNOTHERAPIES IN PREVIOUSLY-TREATED NON-SMALL CELL LUNG CANCER (Lung-MAP Screening Study)   Outgoing call:  Return call to patient regarding her VM inquiring as to the status of study and head CT results. I spoke with patient and provided her an update regarding where we were at with study enrollment. Informed patient I was waiting on pathology to review her 2018 and 2020 biopsies for information regarding histology and that I had received that information yesterday afternoon and needed to wait on study as to how to proceed, patient gave her understanding. Patient asked about the result of Head CT from 05/06/22, I informed her that it was normal. I informed patient of next steps for study and that I will keep her updated as soon as I have more information, hoping to know more before the end of this week. Patient declined further questions at this time. I thanked her for her time and patience and encouraged her to Dr. Benay Spice or myself with questions/concerns she may have.   Maxwell Marion, RN, BSN, [place] Clinical Research Nurse Lead 05/17/2021 2:57 PM

## 2021-05-18 ENCOUNTER — Telehealth: Payer: Self-pay | Admitting: Medical Oncology

## 2021-05-18 ENCOUNTER — Telehealth: Payer: Self-pay | Admitting: *Deleted

## 2021-05-18 ENCOUNTER — Other Ambulatory Visit: Payer: Self-pay | Admitting: Medical Oncology

## 2021-05-18 DIAGNOSIS — C3490 Malignant neoplasm of unspecified part of unspecified bronchus or lung: Secondary | ICD-10-CM

## 2021-05-18 NOTE — Telephone Encounter (Signed)
LungMap/S1900E Outgoing call: 40  Spoke with patient and informed her to expect a call from scheduling for CT scan. Patient understands that the 28-day window for the S1900E sub study CT requirement has expired and another scan is required. Patient knows that it will need to be preauthorized by her insurance first. Informed patient that my goal is to have the CT scan completed this week and that we can register her to the 1900E sub study with a goal to start treatment mid next week. Patient meets all other eligibility requirements at this time and the CT scan is the final criteria required for registration to this substudy. All patient's questions answered to her satisfaction. Patient thanked for her time and encouraged to call with questions in the meantime.   Maxwell Marion, RN, BSN, [facility] Clinical Research Nurse Lead 05/18/2021 1:05 PM

## 2021-05-18 NOTE — Telephone Encounter (Signed)
Called patient w/CT scan for 1/13 at [facility]. Arrive at 1:15 for 1:30 scan. No prep. She agrees. Was not able to come on 1/12 due to work.

## 2021-05-19 ENCOUNTER — Other Ambulatory Visit: Payer: Self-pay | Admitting: Oncology

## 2021-05-19 ENCOUNTER — Other Ambulatory Visit: Payer: Self-pay | Admitting: Medical Oncology

## 2021-05-19 ENCOUNTER — Other Ambulatory Visit (HOSPITAL_COMMUNITY): Payer: Self-pay | Admitting: Interventional Radiology

## 2021-05-19 DIAGNOSIS — C3490 Malignant neoplasm of unspecified part of unspecified bronchus or lung: Secondary | ICD-10-CM

## 2021-05-20 ENCOUNTER — Other Ambulatory Visit: Payer: Self-pay

## 2021-05-20 ENCOUNTER — Ambulatory Visit (HOSPITAL_BASED_OUTPATIENT_CLINIC_OR_DEPARTMENT_OTHER)
Admission: RE | Admit: 2021-05-20 | Discharge: 2021-05-20 | Disposition: A | Discharge status: Home / Self Care | Payer: BC Managed Care – PPO | Source: Ambulatory Visit | Attending: Oncology | Admitting: Oncology

## 2021-05-20 DIAGNOSIS — R911 Solitary pulmonary nodule: Secondary | ICD-10-CM | POA: Diagnosis not present

## 2021-05-20 DIAGNOSIS — Z006 Encounter for examination for normal comparison and control in clinical research program: Secondary | ICD-10-CM | POA: Insufficient documentation

## 2021-05-20 DIAGNOSIS — C3490 Malignant neoplasm of unspecified part of unspecified bronchus or lung: Secondary | ICD-10-CM | POA: Diagnosis not present

## 2021-05-24 ENCOUNTER — Encounter: Payer: Self-pay | Admitting: Medical Oncology

## 2021-05-24 DIAGNOSIS — C3490 Malignant neoplasm of unspecified part of unspecified bronchus or lung: Secondary | ICD-10-CM

## 2021-05-24 NOTE — Research (Signed)
S1900E, A PHASE II STUDY OF SOTORASIB (AMG 510) IN PARTICIPANTS WITH PREVIOUSLY  TREATED STAGE IV OR RECURRENT KRAS G12C MUTATED NON-SQUAMOUS NON-SMALL  CELL LUNG CANCER (ECOG-ACRIN LUNG-MAP SUB-STUDY)  This Nurse has reviewed this patient's inclusion and exclusion criteria as a second review and confirms Gina Cervantes is eligible for study participation.  Patient may continue with enrollment.  Foye Spurling, BSN, RN, [place] Clinical Research Nurse 05/24/2021 2:24 PM

## 2021-05-24 NOTE — Progress Notes (Signed)
S1900E, A PHASE II STUDY OF SOTORASIB (AMG 510) IN PARTICIPANTS WITH PREVIOUSLY TREATED STAGE IV OR RECURRENT KRAS G12C MUTATED NON-SQUAMOUS NON-SMALL CELL LUNG CANCER (ECOG-ACRIN LUNG-MAP SUB-STUDY)  Study Elligibility:   This Nurse has reviewed this patient's inclusion and exclusion criteria and confirmed [name] is eligible for study participation.  Patient will continue with enrollment.  Menopausal status: [name] is post menopausal, with documented LMP as 04/07/2008.   Study Enrollment: 10:40 Trial Name:  S1900E  Patient Gina Cervantes, was registered to the above listed study, assigned [phone] (which carried over from being registered to [facility]). Randomization is not required for this study. Dr. Benay Spice informed of patient's enrollment to substudy 1900E and that study requires for the patient to start treatment within 10 calendar days of enrollment. MD informed that pharmacy will order the study drug sotorasib and as soon as I have a delivery date, I can schedule patient for her Cycle 1 clinic visit with him.   Outgoing call: 11:01 AM I spoke with patient to inform her of enrollment to the sub study and that we have to start her treatment within 10 calendar days. Patient informed me that she wishes to start no later than Monday, the 23rd, due to concern for possible side affects occurring while she is having to drive out of town for the next day. I informed patient that I will do my best to get her treatment started by the 23rd. Informed patient that study drug needs to be delivered to [place], and as soon as I receive a notification of arrival date, I will update her on appointment time for MD visit. Patient gave verbal understanding. Patient was also informed that we will need to draw standard labs, as well as labs for study research, as is stated in the consent form. I also informed patient that I will provide her with a paper medication diary to document study drug  self-administration, a study provided wallet card for her to keep with her should she need to inform other medical personal of study medication, and the study drug Sotorasib, which will all be provided to her at her Cycle 1 appointment with Dr. Benay Spice. All patient's questions answered to her satisfaction, patient denies further questions at this time. I thanked patient for her time and encouraged her to call me should she have further questions.  [facility] alerted of patient status and to order study drug Sotorasib.  Maxwell Marion, RN, BSN, [place] Clinical Research Nurse Lead 05/24/2021 12:28 PM

## 2021-05-26 ENCOUNTER — Telehealth: Payer: Self-pay | Admitting: Medical Oncology

## 2021-05-26 ENCOUNTER — Other Ambulatory Visit: Payer: Self-pay | Admitting: Medical Oncology

## 2021-05-26 DIAGNOSIS — C3490 Malignant neoplasm of unspecified part of unspecified bronchus or lung: Secondary | ICD-10-CM

## 2021-05-26 NOTE — Telephone Encounter (Signed)
S1900E, A PHASE II STUDY OF SOTORASIB (AMG 510) IN PARTICIPANTS WITH PREVIOUSLY TREATED STAGE IV OR RECURRENT KRAS G12C MUTATED NON-SQUAMOUS NON-SMALL CELL LUNG CANCER (ECOG-ACRIN LUNG-MAP SUB-STUDY)  Outgoing call:  Spoke with patient regarding appointment schedule for her treatment to start on study. Patient was informed that Cycle 1 is scheduled for her to start on Monday, January 23rd, with lab @ 10:45 and MD visit to follow. Patient also informed that research labs will be collected that day, along with routine required labs (CBC w/diff  and CMP) for start of cycle 1. Patient gave her verbal understanding, all questions answered to her satisfaction. Patient thanked and encouraged to call me with questions in the meantime.  Maxwell Marion, RN, BSN, [place] Clinical Research Nurse Lead 05/26/2021 3:36 PM

## 2021-05-27 ENCOUNTER — Encounter: Payer: Self-pay | Admitting: Medical Oncology

## 2021-05-27 DIAGNOSIS — C3490 Malignant neoplasm of unspecified part of unspecified bronchus or lung: Secondary | ICD-10-CM

## 2021-05-28 ENCOUNTER — Other Ambulatory Visit: Payer: Self-pay | Admitting: Oncology

## 2021-05-30 ENCOUNTER — Inpatient Hospital Stay: Payer: BC Managed Care – PPO

## 2021-05-30 ENCOUNTER — Encounter: Payer: Self-pay | Admitting: Medical Oncology

## 2021-05-30 ENCOUNTER — Other Ambulatory Visit: Payer: Self-pay

## 2021-05-30 ENCOUNTER — Inpatient Hospital Stay: Payer: BC Managed Care – PPO | Attending: Oncology | Admitting: Oncology

## 2021-05-30 VITALS — BP 133/72 | HR 98 | Temp 97.8°F | Resp 18 | Ht 65.0 in | Wt 164.2 lb

## 2021-05-30 DIAGNOSIS — Z006 Encounter for examination for normal comparison and control in clinical research program: Secondary | ICD-10-CM | POA: Diagnosis not present

## 2021-05-30 DIAGNOSIS — C3490 Malignant neoplasm of unspecified part of unspecified bronchus or lung: Secondary | ICD-10-CM

## 2021-05-30 DIAGNOSIS — Z853 Personal history of malignant neoplasm of breast: Secondary | ICD-10-CM | POA: Diagnosis not present

## 2021-05-30 DIAGNOSIS — D490 Neoplasm of unspecified behavior of digestive system: Secondary | ICD-10-CM

## 2021-05-30 DIAGNOSIS — C3431 Malignant neoplasm of lower lobe, right bronchus or lung: Secondary | ICD-10-CM | POA: Diagnosis not present

## 2021-05-30 DIAGNOSIS — Z9221 Personal history of antineoplastic chemotherapy: Secondary | ICD-10-CM | POA: Insufficient documentation

## 2021-05-30 DIAGNOSIS — Z85048 Personal history of other malignant neoplasm of rectum, rectosigmoid junction, and anus: Secondary | ICD-10-CM | POA: Diagnosis not present

## 2021-05-30 DIAGNOSIS — Z923 Personal history of irradiation: Secondary | ICD-10-CM | POA: Insufficient documentation

## 2021-05-30 LAB — CBC WITH DIFFERENTIAL (CANCER CENTER ONLY)
Abs Immature Granulocytes: 0.01 10*3/uL (ref 0.00–0.07)
Basophils Absolute: 0.1 10*3/uL (ref 0.0–0.1)
Basophils Relative: 1 %
Eosinophils Absolute: 0.2 10*3/uL (ref 0.0–0.5)
Eosinophils Relative: 4 %
HCT: 41.7 % (ref 36.0–46.0)
Hemoglobin: 13.9 g/dL (ref 12.0–15.0)
Immature Granulocytes: 0 %
Lymphocytes Relative: 16 %
Lymphs Abs: 0.8 10*3/uL (ref 0.7–4.0)
MCH: 37.1 pg — ABNORMAL HIGH (ref 26.0–34.0)
MCHC: 33.3 g/dL (ref 30.0–36.0)
MCV: 111.2 fL — ABNORMAL HIGH (ref 80.0–100.0)
Monocytes Absolute: 0.7 10*3/uL (ref 0.1–1.0)
Monocytes Relative: 13 %
Neutro Abs: 3.4 10*3/uL (ref 1.7–7.7)
Neutrophils Relative %: 66 %
Platelet Count: 249 10*3/uL (ref 150–400)
RBC: 3.75 MIL/uL — ABNORMAL LOW (ref 3.87–5.11)
RDW: 12.4 % (ref 11.5–15.5)
WBC Count: 5.1 10*3/uL (ref 4.0–10.5)
nRBC: 0 % (ref 0.0–0.2)

## 2021-05-30 LAB — CMP (CANCER CENTER ONLY)
ALT: 17 U/L (ref 0–44)
AST: 21 U/L (ref 15–41)
Albumin: 4.2 g/dL (ref 3.5–5.0)
Alkaline Phosphatase: 58 U/L (ref 38–126)
Anion gap: 7 (ref 5–15)
BUN: 23 mg/dL — ABNORMAL HIGH (ref 6–20)
CO2: 33 mmol/L — ABNORMAL HIGH (ref 22–32)
Calcium: 10 mg/dL (ref 8.9–10.3)
Chloride: 97 mmol/L — ABNORMAL LOW (ref 98–111)
Creatinine: 0.91 mg/dL (ref 0.44–1.00)
GFR, Estimated: >60 mL/min (ref >60)
Glucose, Bld: 114 mg/dL — ABNORMAL HIGH (ref 70–99)
Potassium: 4.5 mmol/L (ref 3.5–5.1)
Sodium: 137 mmol/L (ref 135–145)
Total Bilirubin: 0.4 mg/dL (ref 0.3–1.2)
Total Protein: 7.9 g/dL (ref 6.5–8.1)

## 2021-05-30 LAB — RESEARCH LABS

## 2021-05-30 MED ORDER — LORAZEPAM 0.5 MG PO TABS: 0.5000 mg | ORAL_TABLET | Freq: Three times a day (TID) | ORAL | 0 refills | Status: DC | PRN

## 2021-05-30 NOTE — Progress Notes (Signed)
S1900E, A PHASE II STUDY OF SOTORASIB (AMG 510) IN PARTICIPANTS WITH PREVIOUSLY TREATED STAGE IV OR RECURRENT KRAS G12C MUTATED NON-SQUAMOUS NON-SMALL CELL LUNG CANCER (ECOG-ACRIN LUNG-MAP SUB-STUDY)  Clinic visit: Cycle 1 Patient in clinic for the start of cycle 1 on study. Patient presented to clinic, with spouse, for labs and MD appointment. Patient denies any changes from previous clinic visit. After labs were drawn, patient and spouse met with Dr. Benay Spice for cycle 1 assessment , H&P and review of what to expect on study.   Labs: research required labs completed this morning, as per protocol, along with standard labs. Study research labs, ctDNA and Plasma, drawn this morning.  Adverse events: Patient denies any adverse events, including pain, GI issues, appetite concerns (see MD"s systems review). Baseline AE's documented below.  Study drug: Study provided Sotorasib dispensed to patient, from [facility] after MD visit. Patient was provided with tow bottles of 120 pills in each, Rx [id number] Lot #[id number], expires 11/2022.Patient instructed to take 8 pills with or without food. I reviewed with patient what foods to avoid, the time range of when pills should be taken as well as what to do should she experience any vomiting. Patient was instructed to take Sotorasib at the same time each day, and to stay within the time range of self administration as is instructed on the medication diary, patient gave her verbal understanding to these instructions. Patient reports she plans on taking Sotorasib at lunch. Patient was also provided with written instructions from study as well as a wallet drug ID card. Patient informed to return both bottles and any remaining study drug at her cycle 2 appointment in three weeks.  [place], reviewed patient's medication listed in EMR and confirms no drug interactions identified.  Medication Diaries: Patient was provided medication diary for documentation of  self-administration. Patient was instructed on daily documentation of time taken and to make any notes in the comment section. Plan: Patient scheduled to return in three weeks time for cycle 2 with labs and MD visit. Cycle three scheduled for February 13th, labs at 11:15, with MD visit to follow. All patient and spouse's questions answered to their satisfaction. Patient was thanked for her time and her contribution to study and was encouraged to call Dr. Benay Spice or myself with any questions or concerns she may have.    Gina Cervantes [id number]  05/30/2021  Adverse Event Log   CTCAE v. 5.0  Study/Protocol: S1900E Cycle: Baseline: chloride, decreased (NCS); carbon dioxide, elevated (NCS); hyperglycemia Gr 1; Bun, elevated (NCS); hypertension Gr 1;    Event Grade Onset Date Resolved Date Drug Name Sotorasib Attribution Treatment Comments                        Maxwell Marion, RN, BSN, [place] Clinical Research Nurse Lead 05/30/2021 1:57 PM

## 2021-05-30 NOTE — Progress Notes (Signed)
[facility] OFFICE PROGRESS NOTE   Diagnosis: Non-small cell lung cancer  INTERVAL HISTORY:   Gina Cervantes returns as scheduled.  She feels well.  No dyspnea.  Good appetite and energy level.  No new complaint.  The right anterior chest Port-A-Cath site has healed.  Objective:  Vital signs in last 24 hours:  Blood pressure 133/72, pulse 98, temperature 97.8 F (36.6 C), temperature source Oral, resp. rate 18, height '5\' 5"'  (1.651 m), weight 164 lb 3.2 oz (74.5 kg), last menstrual period 04/07/2008, SpO2 95 %.    Lymphatics: No cervical, supraclavicular, or axillary nodes Resp: Decreased breath sounds at the right posterior chest, no respiratory distress Cardio: Regular rate and rhythm GI: No hepatosplenomegaly Vascular: No leg edema, the left lower leg is larger than the right side, no tenderness Skin: Mild erythema/hyperpigmentation and induration at the right anterior chest Port-A-Cath site   Lab Results:  Lab Results  Component Value Date   WBC 5.1 05/30/2021   HGB 13.9 05/30/2021   HCT 41.7 05/30/2021   MCV 111.2 (H) 05/30/2021   PLT 249 05/30/2021   NEUTROABS 3.4 05/30/2021    CMP  Lab Results  Component Value Date   NA 135 04/29/2021   K 4.5 04/29/2021   CL 97 (L) 04/29/2021   CO2 31 04/29/2021   GLUCOSE 111 (H) 04/29/2021   BUN 22 (H) 04/29/2021   CREATININE 1.15 (H) 04/29/2021   CALCIUM 9.7 04/29/2021   PROT 8.1 04/29/2021   ALBUMIN 4.2 04/29/2021   AST 23 04/29/2021   ALT 18 04/29/2021   ALKPHOS 69 04/29/2021   BILITOT 0.4 04/29/2021   GFRNONAA 55 (L) 04/29/2021   GFRAA >60 01/29/2020    Lab Results  Component Value Date   CEA 5.5 (H) 08/02/2012     Medications: I have reviewed the patient's current medications.   Assessment/Plan: Anal cancer-left anal margin/anal canal mass, status post a biopsy on 08/09/2012 confirming invasive moderately differentiated squamous cell carcinoma,p16 positive.   -Staging PET scan  08/20/2012-hypermetabolic anal mass, no hypermetabolic metastases   -Initiation of concurrent radiation with cycle 1 of mitomycin C./5-fluorouracil 09/02/2012.   -She completed cycle 2 5-fluorouracil/mitomycin C. beginning 10/01/2012.   -She completed radiation 10/10/2012.   2. Right breast cancer 2009, ER positive, PR positive, HER-2 positive. She is status post neoadjuvant AC x4 cycles followed by Taxol/Herceptin. She underwent a right lumpectomy and sentinel lymph node biopsy 09/22/2008 with no evidence of residual invasive carcinoma and 5 negative sentinel lymph nodes. She then completed right breast radiation. She began tamoxifen 12/15/2008 and completed adjuvant Herceptin 05/25/2009. The tamoxifen was discontinued and she was switched to Arimidex in July 2013.  Arimidex discontinued in mid July 2019. 3. History of enlargement of the right tonsil.   4. Nonspecific pulmonary nodules without hypermetabolic activity on the PET scan 08/20/2012. Chest CT 10/21/2012 with scattered pulmonary nodules including nodules that were not present in 2010. Annual surveillance was recommended. Follow-up chest CT 10/24/2013 with scattered groundglass densities again noted in both lungs mostly unchanged when compared to the previous study. A sub-solid right lower lobe nodule had increased central density. The size was unchanged.The nodule has been present since 2009. CT chest 06/13/2016-a sub-solid 9 mm right upper lobe nodule has enlarged compared to 2015, a right lower lobe irregular nodular density appears more well defined, new vague 5 mm groundglass left upper lobe nodule, left lower lobe 10 mm groundglass nodule is more apparent, posterior left upper lobe circumscribed solid nodule measures  11 x 10 mm and is new PET scan 06/22/2016-malignant range activity involving the 11 mm left upper lobe nodule, no other hypermetabolic nodules, suspicious sub-solid right lower lobe nodule Wedge resection of a hypermetabolic  left upper lobe nodule on 07/05/2016-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 No EGFR, ALK, BRAF, RET, ERBB2, or ROS1 alteration CT chest 11/14/2016-status post left upper lobe wedge resection, stable right lung nodules Chest CT 05/02/2017-stable bilateral solid and groundglass nodules, stable mild mediastinal lymphadenopathy CT chest 12/13/2017- enlargement of right upper lobe nodule, other lung nodules and chest lymph node stable PET 01/09/2018- right upper lobe nodule and right lower lobe nodule have enlarged since 2018 and have low level metabolic activity increased size of an 8 mm right upper lobe nodule without increased metabolic activity, stable activity and a mildly enlarged lower paratracheal node, no evidence of metastatic disease in the abdomen or pelvis CT chest 04/22/2018- no new lung nodules, dominant right lung nodules slightly increased in size Bronchoscopy/EBUS 05/04/2019 with biopsy of level 7 node, biopsy of right lower lobe nodule, FNA of right lower lobe nodule-negative CT biopsy of right upper lobe nodule 06/07/2018- adenocarcinoma with lepidic and acinar patterns SBRT to the dominant right upper lobe nodule 07/23/2018-07/30/2018 CT 09/16/2018-slight increase in size of medial right upper lobe nodule-possibly secondary to interval radiation, other nodules are unchanged CT 12/25/2018- medial right upper lobe nodule has decreased in size slightly, multiple additional solid and groundglass nodules are stable CT 06/09/2019-enlarging right lower lobe nodule, enlarging subsolid nodule, new right upper lobe nodule SBRT to right lower lobe lung nodule, 3 fractions 07/08/2019-07/14/2019 CT 12/05/2019-dominant right lower lobe lesion stable to slightly decreased in size, progressive solid component associated with a dominant right upper lung nodule, mild progression of a left  upper lobe nodule, new subsolid anterior medial left upper lobe nodule Cycle 1 Alimta/carboplatin/pembrolizumab 02/04/2020 Cycle 2 Alimta/carboplatin/pembrolizumab 02/23/2020 Cycle 3 Alimta/carboplatin/pembrolizumab 03/15/2020 Cycle 4 Alimta/carboplatin/pembrolizumab 04/05/2020 (Alimta and carboplatin dose reduced secondary to anemia) CT chest 04/12/2020-decreased size of solid right lower lobe and right upper lobe nodules, groundglass nodules in the left lung unchanged Cycle 5 Alimta/pembrolizumab 05/03/2020 Cycle 6 Alimta/pembrolizumab 05/21/2020 Cycle 7 Alimta/pembrolizumab 06/14/2020 Cycle 8 Alimta/pembrolizumab 07/05/2020 CT chest 07/22/2020-stable nodules, no evidence of disease progression, unchanged multifocal groundglass nodules suspicious for multifocal adenocarcinoma. Cycle 9 Alimta/Pembrolizumab 07/26/2020 Cycle 10 Alimta/Pembrolizumab 08/16/2020 Cycle 11 Alimta/Pembrolizumab 09/06/2020 Cycle 12 Alimta/pembrolizumab 09/27/2020 Cycle 13 Alimta/pembrolizumab 10/18/2020 CT chest 11/12/2020-enlarging soft tissue density right upper lobe medially adjacent to the mediastinum measuring 2.7 x 2.1 cm, concerning for recurrent tumor.  Stable radiation changes involving the right upper lobe and right lower lobe.  Stable scattered small solid and subsolid pulmonary nodules bilaterally.  New right-sided pleural effusion.  Stable borderline enlarged mediastinal lymph nodes. Bronchoscopy 12/28/2020-right upper lobe and left lower lobe lesions biopsy-negative pathology PET 3/41/9622-WLN metabolic activity involving the right suprahilar consolidative process-favored benign, measured smaller, additional groundglass densities with very low-level metabolic activity, radiation change in the right lower lobe, resolution of right pleural effusion, no distant metastatic disease Cycle 14 Alimta/pembrolizumab 01/19/2021 Cycle 15 Alimta/pembrolizumab 02/07/2021 Cycle 16 of Alimta/pembrolizumab 02/28/2021 Cycle 17  Alimta/pembrolizumab 03/28/2021 CT chest 04/18/2021-increased areas of masslike density in the right upper lobe and right lower lobe with a new right lower lobe satellite nodule, other groundglass attenuation lesions are stable, slight decrease in size of chronic right pleural effusion CT head 05/06/2021-no evidence of metastatic disease CT  chest 05/20/2018-right upper lobe and right lower lobe masses are unchanged, groundglass nodules in the left lung are unchanged, no mediastinal adenopathy, resist measurements of target lesions given 05/30/2021 SWOG S1900E-sotorasib 5.  Baker's cyst left leg, pain and swelling in the left calf, negative Doppler 01/24/2020 and 01/29/2020-evaluated by orthopedics and underwent aspiration of the cyst 01/29/2000, completed a course of Keflex 6.  Anemia secondary to chemotherapy, status post 2 units of packed red blood cells on 04/10/2020-improved 7.  Port-A-Cath placement 01/12/2021; Port-A-Cath removal 02/24/2021 due to infection, culture positive for MRSA, status post 2 courses of doxycycline, Bactrim DS started 03/28/2021, Bactrim DS resumed 04/26/2021      Disposition: Ms. Nop appears stable.  The right anterior chest Port-A-Cath site has healed.  She will begin treatment on the SWOG S1900 E study today.  We reviewed potential toxicities associated with sotorasib.  She met with a Nutritional therapist.  The plan is to begin treatment today.  She will return for an office and lab visit in 3 weeks.  Her ECOG performance status is measured at 0 today.  Gina Coder, MD  05/30/2021  11:36 AM

## 2021-05-30 NOTE — Addendum Note (Signed)
Addended by: Betsy Coder B on: 05/30/2021 11:50 AM   Modules accepted: Orders

## 2021-05-31 ENCOUNTER — Encounter: Payer: Self-pay | Admitting: Medical Oncology

## 2021-05-31 DIAGNOSIS — C3490 Malignant neoplasm of unspecified part of unspecified bronchus or lung: Secondary | ICD-10-CM

## 2021-05-31 NOTE — Progress Notes (Signed)
DCP-001, Use of a Clinical Trial Screening Tool to Address Cancer Health Disparities in the [place] Program [facility]  Late Entry:Patient eligible for study enrollment.  The consent and authorization forms were reviewed with patient in their entirety on Monday, January 24th, 2023. This nurse, explained to the patient that this study involves a one-time consent, and collection of demographic variables, with the majority of data collected from her medical record. It was noted to the patient that no patient identifiers are being reported via the screening tool. All of patient's questions were answered to her satisfaction and she knows that participation is voluntary. Patient agreed to participate and protocol version date October 29, 2018, and authorization form version 5 April 23, 2019 were signed and dated by the patient. Copies of her signed document will be mailed to her. Patient thanked for her time and contribution to the study. Patient encouraged to call with questions.  Patient meets eligibility and will be enrolled in the DCP-001 study.  Maxwell Marion, RN, BSN, [place] Clinical Research Nurse Lead 05/31/2021 2:42 PM

## 2021-06-02 ENCOUNTER — Encounter: Payer: Self-pay | Admitting: Oncology

## 2021-06-07 ENCOUNTER — Telehealth: Payer: Self-pay | Admitting: Medical Oncology

## 2021-06-07 NOTE — Telephone Encounter (Signed)
DCP-001, Use of a Clinical Trial Screening Tool to Address Cancer Health Disparities in the [place])  Outgoing call: 11:31 Call to patient informing her that the study had a few additional questions related to sexual identity and gender identification that I was not aware of. Inquired with patient if she consents to me collecting this information from her Medical record demographic information in Epic. Patient provided me with the answers to the study questions over the phone. Patient thanked for her time.  Maxwell Marion, RN, BSN, [facility] Clinical Research Nurse Lead 06/07/2021 12:08 PM

## 2021-06-07 NOTE — Telephone Encounter (Signed)
S1900E, A PHASE II STUDY OF SOTORASIB (AMG 510) IN PARTICIPANTS WITH PREVIOUSLY TREATED STAGE IV OR RECURRENT KRAS G12C MUTATED NON-SQUAMOUS NON-SMALL CELL LUNG CANCER (ECOG-ACRIN LUNG-MAP SUB-STUDY)  Outside call: 11:31 Call to patient to inquire how she is feeling now that she has had a week of being on the study drug sotorasib. Patient reports to be doing well, states that she "feels almost normal" and confirms no side effects. Patient confirms no issues with self administration and documentation of pills and states she takes the required dosage just after lunch. I thanked patient for her time and encouraged her to call me with any questions/concerns in the meantime. Next clinic visit confirmed.   Maxwell Marion, RN, BSN, [facility] Clinical Research Nurse Lead 06/07/2021 12:03 PM

## 2021-06-15 ENCOUNTER — Other Ambulatory Visit: Payer: Self-pay | Admitting: Medical Oncology

## 2021-06-15 DIAGNOSIS — C3490 Malignant neoplasm of unspecified part of unspecified bronchus or lung: Secondary | ICD-10-CM

## 2021-06-19 ENCOUNTER — Other Ambulatory Visit: Payer: Self-pay | Admitting: Oncology

## 2021-06-20 ENCOUNTER — Encounter: Payer: Self-pay | Admitting: Medical Oncology

## 2021-06-20 ENCOUNTER — Encounter: Payer: Self-pay | Admitting: Oncology

## 2021-06-20 ENCOUNTER — Inpatient Hospital Stay (HOSPITAL_BASED_OUTPATIENT_CLINIC_OR_DEPARTMENT_OTHER): Payer: BC Managed Care – PPO | Admitting: Oncology

## 2021-06-20 ENCOUNTER — Inpatient Hospital Stay: Payer: BC Managed Care – PPO | Attending: Oncology

## 2021-06-20 ENCOUNTER — Other Ambulatory Visit: Payer: Self-pay

## 2021-06-20 VITALS — BP 117/62 | HR 95 | Temp 97.8°F | Resp 20 | Ht 65.0 in | Wt 165.8 lb

## 2021-06-20 DIAGNOSIS — Z9221 Personal history of antineoplastic chemotherapy: Secondary | ICD-10-CM | POA: Diagnosis not present

## 2021-06-20 DIAGNOSIS — C3490 Malignant neoplasm of unspecified part of unspecified bronchus or lung: Secondary | ICD-10-CM

## 2021-06-20 DIAGNOSIS — Z853 Personal history of malignant neoplasm of breast: Secondary | ICD-10-CM | POA: Insufficient documentation

## 2021-06-20 DIAGNOSIS — Z006 Encounter for examination for normal comparison and control in clinical research program: Secondary | ICD-10-CM | POA: Insufficient documentation

## 2021-06-20 DIAGNOSIS — R918 Other nonspecific abnormal finding of lung field: Secondary | ICD-10-CM | POA: Diagnosis not present

## 2021-06-20 DIAGNOSIS — C3431 Malignant neoplasm of lower lobe, right bronchus or lung: Secondary | ICD-10-CM | POA: Insufficient documentation

## 2021-06-20 DIAGNOSIS — Z923 Personal history of irradiation: Secondary | ICD-10-CM | POA: Diagnosis not present

## 2021-06-20 DIAGNOSIS — R110 Nausea: Secondary | ICD-10-CM | POA: Diagnosis not present

## 2021-06-20 DIAGNOSIS — C3412 Malignant neoplasm of upper lobe, left bronchus or lung: Secondary | ICD-10-CM

## 2021-06-20 LAB — CBC WITH DIFFERENTIAL (CANCER CENTER ONLY)
Abs Immature Granulocytes: 0.02 10*3/uL (ref 0.00–0.07)
Basophils Absolute: 0.1 10*3/uL (ref 0.0–0.1)
Basophils Relative: 1 %
Eosinophils Absolute: 0.2 10*3/uL (ref 0.0–0.5)
Eosinophils Relative: 3 %
HCT: 40.6 % (ref 36.0–46.0)
Hemoglobin: 13.4 g/dL (ref 12.0–15.0)
Immature Granulocytes: 0 %
Lymphocytes Relative: 17 %
Lymphs Abs: 0.9 10*3/uL (ref 0.7–4.0)
MCH: 36 pg — ABNORMAL HIGH (ref 26.0–34.0)
MCHC: 33 g/dL (ref 30.0–36.0)
MCV: 109.1 fL — ABNORMAL HIGH (ref 80.0–100.0)
Monocytes Absolute: 0.7 10*3/uL (ref 0.1–1.0)
Monocytes Relative: 14 %
Neutro Abs: 3.5 10*3/uL (ref 1.7–7.7)
Neutrophils Relative %: 65 %
Platelet Count: 269 10*3/uL (ref 150–400)
RBC: 3.72 MIL/uL — ABNORMAL LOW (ref 3.87–5.11)
RDW: 12.3 % (ref 11.5–15.5)
WBC Count: 5.4 10*3/uL (ref 4.0–10.5)
nRBC: 0 % (ref 0.0–0.2)

## 2021-06-20 LAB — CMP (CANCER CENTER ONLY)
ALT: 21 U/L (ref 0–44)
AST: 27 U/L (ref 15–41)
Albumin: 4.1 g/dL (ref 3.5–5.0)
Alkaline Phosphatase: 58 U/L (ref 38–126)
Anion gap: 10 (ref 5–15)
BUN: 20 mg/dL (ref 6–20)
CO2: 31 mmol/L (ref 22–32)
Calcium: 9.6 mg/dL (ref 8.9–10.3)
Chloride: 95 mmol/L — ABNORMAL LOW (ref 98–111)
Creatinine: 0.87 mg/dL (ref 0.44–1.00)
GFR, Estimated: >60 mL/min (ref >60)
Glucose, Bld: 103 mg/dL — ABNORMAL HIGH (ref 70–99)
Potassium: 4.6 mmol/L (ref 3.5–5.1)
Sodium: 136 mmol/L (ref 135–145)
Total Bilirubin: 0.4 mg/dL (ref 0.3–1.2)
Total Protein: 7.8 g/dL (ref 6.5–8.1)

## 2021-06-20 LAB — RESEARCH LABS

## 2021-06-20 NOTE — Progress Notes (Signed)
[facility] OFFICE PROGRESS NOTE   Diagnosis: Non-small cell lung cancer  INTERVAL HISTORY:   Gina Cervantes returns as scheduled.  She continues sotorasib.  No rash or diarrhea.  She feels well.  Good appetite.  No dyspnea.  She reports mild nausea immediately after taking the sotorasib.  This is relieved with lorazepam.  Objective:  Vital signs in last 24 hours:  Blood pressure 117/62, pulse 95, temperature 97.8 F (36.6 C), temperature source Oral, resp. rate 20, height '5\' 5"'  (1.651 m), weight 165 lb 12.8 oz (75.2 kg), last menstrual period 04/07/2008, SpO2 99 %.    HEENT: No thrush or ulcers Resp: Lungs clear bilaterally Cardio: Regular rate and rhythm GI: No hepatosplenomegaly, nontender Vascular: No leg edema  Skin: No rash, mild erythema and induration at the right anterior chest Port-A-Cath site.  No drainage.  Lab Results:  Lab Results  Component Value Date   WBC 5.4 06/20/2021   HGB 13.4 06/20/2021   HCT 40.6 06/20/2021   MCV 109.1 (H) 06/20/2021   PLT 269 06/20/2021   NEUTROABS 3.5 06/20/2021    CMP  Lab Results  Component Value Date   NA 137 05/30/2021   K 4.5 05/30/2021   CL 97 (L) 05/30/2021   CO2 33 (H) 05/30/2021   GLUCOSE 114 (H) 05/30/2021   BUN 23 (H) 05/30/2021   CREATININE 0.91 05/30/2021   CALCIUM 10.0 05/30/2021   PROT 7.9 05/30/2021   ALBUMIN 4.2 05/30/2021   AST 21 05/30/2021   ALT 17 05/30/2021   ALKPHOS 58 05/30/2021   BILITOT 0.4 05/30/2021   GFRNONAA >60 05/30/2021   GFRAA >60 01/29/2020     Medications: I have reviewed the patient's current medications.   Assessment/Plan: Anal cancer-left anal margin/anal canal mass, status post a biopsy on 08/09/2012 confirming invasive moderately differentiated squamous cell carcinoma,p16 positive.   -Staging PET scan 08/20/2012-hypermetabolic anal mass, no hypermetabolic metastases   -Initiation of concurrent radiation with cycle 1 of mitomycin C./5-fluorouracil 09/02/2012.    -She completed cycle 2 5-fluorouracil/mitomycin C. beginning 10/01/2012.   -She completed radiation 10/10/2012.   2. Right breast cancer 2009, ER positive, PR positive, HER-2 positive. She is status post neoadjuvant AC x4 cycles followed by Taxol/Herceptin. She underwent a right lumpectomy and sentinel lymph node biopsy 09/22/2008 with no evidence of residual invasive carcinoma and 5 negative sentinel lymph nodes. She then completed right breast radiation. She began tamoxifen 12/15/2008 and completed adjuvant Herceptin 05/25/2009. The tamoxifen was discontinued and she was switched to Arimidex in July 2013.  Arimidex discontinued in mid July 2019. 3. History of enlargement of the right tonsil.   4. Nonspecific pulmonary nodules without hypermetabolic activity on the PET scan 08/20/2012. Chest CT 10/21/2012 with scattered pulmonary nodules including nodules that were not present in 2010. Annual surveillance was recommended. Follow-up chest CT 10/24/2013 with scattered groundglass densities again noted in both lungs mostly unchanged when compared to the previous study. A sub-solid right lower lobe nodule had increased central density. The size was unchanged.The nodule has been present since 2009. CT chest 06/13/2016-a sub-solid 9 mm right upper lobe nodule has enlarged compared to 2015, a right lower lobe irregular nodular density appears more well defined, new vague 5 mm groundglass left upper lobe nodule, left lower lobe 10 mm groundglass nodule is more apparent, posterior left upper lobe circumscribed solid nodule measures 11 x 10 mm and is new PET scan 06/22/2016-malignant range activity involving the 11 mm left upper lobe nodule, no other hypermetabolic nodules,  suspicious sub-solid right lower lobe nodule Wedge resection of a hypermetabolic left upper lobe nodule on 07/05/2016-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 No EGFR, ALK, BRAF, RET, ERBB2, or ROS1 alteration CT chest 11/14/2016-status post left upper lobe wedge resection, stable right lung nodules Chest CT 05/02/2017-stable bilateral solid and groundglass nodules, stable mild mediastinal lymphadenopathy CT chest 12/13/2017- enlargement of right upper lobe nodule, other lung nodules and chest lymph node stable PET 01/09/2018- right upper lobe nodule and right lower lobe nodule have enlarged since 2018 and have low level metabolic activity increased size of an 8 mm right upper lobe nodule without increased metabolic activity, stable activity and a mildly enlarged lower paratracheal node, no evidence of metastatic disease in the abdomen or pelvis CT chest 04/22/2018- no new lung nodules, dominant right lung nodules slightly increased in size Bronchoscopy/EBUS 05/04/2019 with biopsy of level 7 node, biopsy of right lower lobe nodule, FNA of right lower lobe nodule-negative CT biopsy of right upper lobe nodule 06/07/2018- adenocarcinoma with lepidic and acinar patterns SBRT to the dominant right upper lobe nodule 07/23/2018-07/30/2018 CT 09/16/2018-slight increase in size of medial right upper lobe nodule-possibly secondary to interval radiation, other nodules are unchanged CT 12/25/2018- medial right upper lobe nodule has decreased in size slightly, multiple additional solid and groundglass nodules are stable CT 06/09/2019-enlarging right lower lobe nodule, enlarging subsolid nodule, new right upper lobe nodule SBRT to right lower lobe lung nodule, 3 fractions 07/08/2019-07/14/2019 CT 12/05/2019-dominant right lower lobe lesion stable to slightly decreased in size, progressive solid component associated with a dominant right upper lung nodule, mild progression of a left upper lobe nodule, new subsolid anterior medial left upper lobe nodule Cycle 1 Alimta/carboplatin/pembrolizumab 02/04/2020 Cycle 2 Alimta/carboplatin/pembrolizumab  02/23/2020 Cycle 3 Alimta/carboplatin/pembrolizumab 03/15/2020 Cycle 4 Alimta/carboplatin/pembrolizumab 04/05/2020 (Alimta and carboplatin dose reduced secondary to anemia) CT chest 04/12/2020-decreased size of solid right lower lobe and right upper lobe nodules, groundglass nodules in the left lung unchanged Cycle 5 Alimta/pembrolizumab 05/03/2020 Cycle 6 Alimta/pembrolizumab 05/21/2020 Cycle 7 Alimta/pembrolizumab 06/14/2020 Cycle 8 Alimta/pembrolizumab 07/05/2020 CT chest 07/22/2020-stable nodules, no evidence of disease progression, unchanged multifocal groundglass nodules suspicious for multifocal adenocarcinoma. Cycle 9 Alimta/Pembrolizumab 07/26/2020 Cycle 10 Alimta/Pembrolizumab 08/16/2020 Cycle 11 Alimta/Pembrolizumab 09/06/2020 Cycle 12 Alimta/pembrolizumab 09/27/2020 Cycle 13 Alimta/pembrolizumab 10/18/2020 CT chest 11/12/2020-enlarging soft tissue density right upper lobe medially adjacent to the mediastinum measuring 2.7 x 2.1 cm, concerning for recurrent tumor.  Stable radiation changes involving the right upper lobe and right lower lobe.  Stable scattered small solid and subsolid pulmonary nodules bilaterally.  New right-sided pleural effusion.  Stable borderline enlarged mediastinal lymph nodes. Bronchoscopy 12/28/2020-right upper lobe and left lower lobe lesions biopsy-negative pathology PET 07/03/3333-KTG metabolic activity involving the right suprahilar consolidative process-favored benign, measured smaller, additional groundglass densities with very low-level metabolic activity, radiation change in the right lower lobe, resolution of right pleural effusion, no distant metastatic disease Cycle 14 Alimta/pembrolizumab 01/19/2021 Cycle 15 Alimta/pembrolizumab 02/07/2021 Cycle 16 of Alimta/pembrolizumab 02/28/2021 Cycle 17 Alimta/pembrolizumab 03/28/2021 CT chest 04/18/2021-increased areas of masslike density in the right upper lobe and right lower lobe with a new right lower lobe satellite nodule,  other groundglass attenuation lesions are stable, slight decrease in size of chronic right pleural effusion CT head 05/06/2021-no evidence of metastatic disease CT chest 05/20/2018-right upper lobe and right lower lobe masses are unchanged, groundglass nodules in the left lung are unchanged, no mediastinal adenopathy, resist measurements  of target lesions given 05/30/2021 SWOG S1900E-sotorasib 5.  Baker's cyst left leg, pain and swelling in the left calf, negative Doppler 01/24/2020 and 01/29/2020-evaluated by orthopedics and underwent aspiration of the cyst 01/29/2000, completed a course of Keflex 6.  Anemia secondary to chemotherapy, status post 2 units of packed red blood cells on 04/10/2020-improved 7.  Port-A-Cath placement 01/12/2021; Port-A-Cath removal 02/24/2021 due to infection, culture positive for MRSA, status post 2 courses of doxycycline, Bactrim DS started 03/28/2021, Bactrim DS resumed 04/26/2021     Disposition: Gina Cervantes appears stable.  She is tolerating the sotorasib well.  She will continue the current treatment.  Her ECOG performance status is measured at 0 today.  She will return for an office visit in 3 weeks.  Betsy Coder, MD  06/20/2021  12:04 PM

## 2021-06-20 NOTE — Progress Notes (Signed)
S1900E, A PHASE II STUDY OF SOTORASIB (AMG 510) IN PARTICIPANTS WITH PREVIOUSLY TREATED STAGE IV OR RECURRENT KRAS G12C MUTATED NON-SQUAMOUS NON-SMALL CELL LUNG CANCER (ECOG-ACRIN LUNG-MAP SUB-STUDY)  Clinic visit: Cycle 2 Patient in clinic for the start of cycle 2 on study. Patient presented to clinic, with spouse, for labs and MD appointment. Patient denies any changes from previous clinic visit. Patient reports to feel "wonderful in general." Patient denies having any issues with taking and documenting study drug. Patient reports slight nausea when first taking the 8 tablets and resolves with lorazepam. Patient confirms no new prescriptions or over the contour medications and states she no longer uses the Bactroban. Patient and spouse met with Dr. Benay Spice for cycle 2 assessment.   Labs: research required labs completed this morning, as per protocol, along with standard labs. Study research labs, ctDNA and Plasma, drawn this morning. Patient reports to have been fasting for labs. H&P: MD assessed patient and reviewed labs and cleared patient to start cycle 2. Per MD, patient ECOG =  0. Adverse events: Patient denies any adverse events, including pain, GI issues, appetite concerns (see MD"s systems review). Patient confirms no new or worsening concerns. Study drug: Patient returned the Cycle 1 Sotorasib dispensed to patient, from [facility] after MD visit. Patient reports to have thrown the one empty bottle away, but did return the second bottle containing 64 pills, Rx 2689-0 Lot #[id number], expires 11/2022. Patient was dispensed two bottles of Sotorasib, both bottles with the lot# [id number], Rx 2725-0, 120 mg, with 120 pills in each bottle, to continue with 8 pills daily. Patient instructed to return both bottles and remaining pills at her Cycle 3 visit.  Medication Diaries: Patient returned her cycle 1 medication diary and reports/documents no missed doses. Patient did not realize that she forgot to document one  day of taking study drug, but does confirm that she did not miss a day. Patient took 8 pills, 916m dosage from cycle 1 bottle, in clinic, and documented on cycle 2 calendar for the 13th of February. Remaining pill count confirms no missed days. With today's dose taken in clinic, patient knows not take any more for today. Patient was provided with cycle 2 medication diary for documentation of self-administration. Patient was instructed on daily documentation of time taken and to continue to make any notes in the comment section. Plan: Patient scheduled to return in three weeks time for cycle 3 with labs and MD visit. Patient will be scheduled for her first 6-week on study scan prior to her cycle three clinic visit. All patient and spouse's questions answered to their satisfaction. Patient was thanked for her time and encouraged to call Dr. SBenay Spiceor myself with any questions or concerns she may have.    KDeliliah SprangerCowett [phone] 06/20/2021  Adverse Event Log   CTCAE v. 5.0  Study/Protocol: S1900E Cycle: Baseline: chloride, decreased (NCS); carbon dioxide, elevated (NCS); hyperglycemia Gr 1; Bun, elevated (NCS); hypertension Gr 1;    Event Grade Onset Date Resolved Date Drug Name Sotorasib Attribution Treatment Comments                        MMaxwell Marion RN, BSN, CAshleyClinical Research Nurse Lead 06/20/2021 3:04 PM

## 2021-06-21 ENCOUNTER — Other Ambulatory Visit: Payer: Self-pay | Admitting: Medical Oncology

## 2021-06-21 ENCOUNTER — Encounter: Payer: Self-pay | Admitting: Oncology

## 2021-06-21 DIAGNOSIS — C3490 Malignant neoplasm of unspecified part of unspecified bronchus or lung: Secondary | ICD-10-CM

## 2021-06-21 MED ORDER — INV-SOTORASIB 120 MG TAB (AMGEN 510) SWOG S1900E: 960.0000 mg | ORAL_TABLET | Freq: Every day | ORAL | 0 refills | Status: DC

## 2021-06-22 ENCOUNTER — Telehealth: Payer: Self-pay | Admitting: Oncology

## 2021-06-22 NOTE — Telephone Encounter (Signed)
Contacted patient to schedule per sch msg 2/14, she is aware of date and time.

## 2021-06-27 ENCOUNTER — Telehealth: Payer: Self-pay

## 2021-06-27 ENCOUNTER — Telehealth: Payer: Self-pay | Admitting: Medical Oncology

## 2021-06-27 NOTE — Telephone Encounter (Signed)
Outgoing call: [phone]  Call to patient to inform her that I spoke with Dr. Gearldine Shown nurse, Lenox Ponds RN. Per Santiago Glad, Dr. Gearldine Shown SOC protocol for diarrhea, informed patient she can take "OTC imodium, up to six times per day."   I requested patient to call clinic should diarrhea worsen or not resolve. Patient gave verbal understanding that she will call to let me how she is feeling. Patient thanked.   Maxwell Marion, RN, BSN, [facility] Clinical Research Nurse Lead 06/27/2021 2:00 PM

## 2021-06-27 NOTE — Telephone Encounter (Signed)
S1900E, A PHASE II STUDY OF SOTORASIB (AMG 510) IN PARTICIPANTS WITH PREVIOUSLY TREATED STAGE IV OR RECURRENT KRAS G12C MUTATED NON-SQUAMOUS NON-SMALL CELL LUNG CANCER (ECOG-ACRIN LUNG-MAP SUB-STUDY)  Outgoing call: 10:02 Patient sent me an email over the weekend (Saturday) informing me of having diarrhea.  I called patient this morning to get more detail of when it started and how frequent. Patient confirms having no other side-effects, including no pain.  Patient reports diarrhea started "late in the day" on Tuesday (2/14) with some loose stool. Per patient, by Wednesday and Thursday, she was having two-three loose stools. Patient states, Friday and Saturday, diarrhea increased to 5 episodes those two days. By Sunday, it decreased to three times for the day and as of this morning, she has had 2 episodes of diarrhea.  Patient confirms to be drinking plenty of water and eating and focusing on foods within the [employer].   I reviewed the CTCAE v5.0, per study protocl ,and per CTCAE diarrhea at its worst was a Grade 2, now down to a Grade 1.  Patient requesting an OTC, if possible, vs a prescription. Per study, diarrhea is one of the adverse side effects and if a grade 3 to 4, to hold Sotorasib. Patient's diarrhea between a grade 1 -2. Patient reports diarrhea occurs in the mornings and she is having no diarrhea in the evenings. Patient reports she has stopped her vitamins on Saturday, in hopes that this would help with the diarrhea.   Patient requesting if she could have her upcoming CT scan completed at [place], due to the cost is less.   I informed patient that I will forward this message to Dr. Benay Spice for review and for her to expect a call from his nurse regarding any medications he is recommending for her to take.  Patient thanked for the update and was instructed to call should it worsen, or should she have questions/concerns.     Maxwell Marion, RN, BSN, [facility] Clinical  Research Nurse Lead 06/27/2021 10:30 AM

## 2021-06-27 NOTE — Telephone Encounter (Signed)
TC from [place] stating Pt is having diarrhea informed Rubin Payor that Pt should start with imodium up to 6 a day for diarrhea. And if this doesn't work to give a return call to the office.

## 2021-07-01 ENCOUNTER — Telehealth: Payer: Self-pay | Admitting: Medical Oncology

## 2021-07-01 ENCOUNTER — Telehealth: Payer: Self-pay

## 2021-07-01 ENCOUNTER — Other Ambulatory Visit: Payer: Self-pay

## 2021-07-01 MED ORDER — DIPHENOXYLATE-ATROPINE 2.5-0.025 MG PO TABS
2.0000 | ORAL_TABLET | Freq: Four times a day (QID) | ORAL | 0 refills | Status: DC | PRN
Start: 2021-07-01 — End: 2021-07-08

## 2021-07-01 NOTE — Telephone Encounter (Signed)
Correspondence from [place] Nurse stating Pt is still having diarrhea. Per Dr Benay Spice prescription for lomotil called into pharmacy and relayed to Pt she could rotate taking lomotil and imodium. Talking to Pt she feels imodium did not help with the diarrhea, And confirmed prescription was sent to pharmacy. Pt informed If she can give a return call to the office if this regimen is not effective with the diarrhea. Pt verbalized understanding.

## 2021-07-01 NOTE — Telephone Encounter (Signed)
S1900E, A PHASE II STUDY OF SOTORASIB (AMG 510) IN PARTICIPANTS WITH PREVIOUSLY TREATED STAGE IV OR RECURRENT KRAS G12C MUTATED NON-SQUAMOUS NON-SMALL CELL LUNG CANCER (ECOG-ACRIN LUNG-MAP SUB-STUDY  Outgoing call: [phone]  Patient sent me an email informing me that she has worsening diarrhea and that the imodium isn't working.Return call to patient this morning at 0855. LVMOM asking her to call me that I can assess. Sent message to Dr. Benay Spice and his nurse informing that patient's diarrhea has not resulted with imodium.   Out going call to patient at 1105 to follow-up, since she had not returned my phone call. Spoke with patient and she had informed me that she continues to have diarrhea, reports an increase to seven or more episodes (grade 3). Patient states that she has been reducing her caloric intake and watching what she eats to decrease the episodes. Patient states that she last took 2 imodium's at 0500 this morning and it has not helped. Patient was informed that per the study, she will need to hold sotorasib until she returns to less than 4 episodes per day or to her baseline. Once she returns to grade 1 or less or baseline, she will restart at a lower dosage of sotorasib. Patient gave verbal understanding.   Dr. Benay Spice informed of patient's diarrhea and the study protocol required dose modifications for protocol treatment related toxicity for Grade 3 diarrhea. Per Dr. Benay Spice, patient to hold sotorasib until diarrhea controlled and prescribed lomotil 1-2 post each loose stool up to 8/day. I informed patient of Dr. Gearldine Shown instructions and patient gave verbal understanding. Patient knows to stop sotorasib until further notice. Patient reports last dose was yesterday (06/30/2021). Patient instructed to call should symptoms worsen, otherwise I will call her Monday to assess how she is doing. Patient thanked for keeping me updated.   Maxwell Marion, RN, BSN, [facility] Clinical Research Nurse  Lead 07/01/2021 12:21 PM

## 2021-07-05 ENCOUNTER — Telehealth: Payer: Self-pay | Admitting: Medical Oncology

## 2021-07-05 NOTE — Telephone Encounter (Signed)
S1900E, A PHASE II STUDY OF SOTORASIB (AMG 510) IN PARTICIPANTS WITH PREVIOUSLY TREATED STAGE IV OR RECURRENT KRAS G12C MUTATED NON-SQUAMOUS NON-SMALL CELL LUNG CANCER (ECOG-ACRIN LUNG-MAP SUB-STUDY  Outgoing call: 3:23 PM LVMOM with patient to follow up regarding start of lomotil. Inquired with patient if lomotil has helped with the diarrhea and to confirm that patient has not restarted the Sotorasib, which was placed on hold till patient can be reassessed. Asked patient to return call at her earliest convenience. Patient thanked. My return call information provided.   Maxwell Marion, RN, BSN, [place] Clinical Research Nurse Lead 07/05/2021 3:27 PM

## 2021-07-05 NOTE — Telephone Encounter (Signed)
S1900E, A PHASE II STUDY OF SOTORASIB (AMG 510) IN PARTICIPANTS WITH PREVIOUSLY TREATED STAGE IV OR RECURRENT KRAS G12C MUTATED NON-SQUAMOUS NON-SMALL CELL LUNG CANCER (ECOG-ACRIN LUNG-MAP SUB-STUDY)  Incoming call: 4:08 PM Received return call from patient. Patient informed me that she continues to have 2-5 episodes of diarrhea since the start of lomotil on Friday. Patient confirms to be taking lomotil as instructed, up to 4 doses per day as needed for diarrhea or loose stools. Patient reports having no diarrhea/loose stool on Saturday. Patient reports to be watching what she eats due to diarrhea occurs depending on what she is eating. Patient confirms that she has not taken anymore of the study drug Sotorasib since instructed to stop. Patient denies other issues.  Patient with week 6 on-study CT scan March 2nd at 11:30. Cycle 3 clinic visit/MD appointment March 6th at 11:40.  Patient thanked for her time and encouraged to call in the meantime with issues/concerns.  Maxwell Marion, RN, BSN, [place] Clinical Research Nurse Lead 07/05/2021 4:27 PM

## 2021-07-07 ENCOUNTER — Ambulatory Visit (HOSPITAL_BASED_OUTPATIENT_CLINIC_OR_DEPARTMENT_OTHER)
Admission: RE | Admit: 2021-07-07 | Discharge: 2021-07-07 | Disposition: A | Discharge status: Home / Self Care | Payer: BC Managed Care – PPO | Source: Ambulatory Visit | Attending: Oncology | Admitting: Oncology

## 2021-07-07 ENCOUNTER — Other Ambulatory Visit: Payer: Self-pay

## 2021-07-07 DIAGNOSIS — J90 Pleural effusion, not elsewhere classified: Secondary | ICD-10-CM | POA: Diagnosis not present

## 2021-07-07 DIAGNOSIS — R911 Solitary pulmonary nodule: Secondary | ICD-10-CM | POA: Diagnosis not present

## 2021-07-07 DIAGNOSIS — C3490 Malignant neoplasm of unspecified part of unspecified bronchus or lung: Secondary | ICD-10-CM | POA: Insufficient documentation

## 2021-07-08 ENCOUNTER — Other Ambulatory Visit: Payer: Self-pay | Admitting: Nurse Practitioner

## 2021-07-08 ENCOUNTER — Inpatient Hospital Stay: Payer: BC Managed Care – PPO

## 2021-07-08 ENCOUNTER — Other Ambulatory Visit: Payer: Self-pay

## 2021-07-08 ENCOUNTER — Telehealth: Payer: Self-pay

## 2021-07-08 ENCOUNTER — Encounter: Payer: Self-pay | Admitting: Oncology

## 2021-07-08 ENCOUNTER — Other Ambulatory Visit (HOSPITAL_COMMUNITY): Payer: Self-pay

## 2021-07-08 ENCOUNTER — Telehealth: Payer: Self-pay | Admitting: Medical Oncology

## 2021-07-08 ENCOUNTER — Other Ambulatory Visit: Payer: Self-pay | Admitting: Medical Oncology

## 2021-07-08 ENCOUNTER — Other Ambulatory Visit: Payer: Self-pay | Admitting: Oncology

## 2021-07-08 ENCOUNTER — Inpatient Hospital Stay: Payer: BC Managed Care – PPO | Attending: Oncology | Admitting: Nurse Practitioner

## 2021-07-08 VITALS — BP 110/67 | HR 97 | Temp 97.8°F | Resp 18 | Ht 65.0 in | Wt 160.2 lb

## 2021-07-08 DIAGNOSIS — C3431 Malignant neoplasm of lower lobe, right bronchus or lung: Secondary | ICD-10-CM | POA: Insufficient documentation

## 2021-07-08 DIAGNOSIS — R197 Diarrhea, unspecified: Secondary | ICD-10-CM

## 2021-07-08 DIAGNOSIS — C3490 Malignant neoplasm of unspecified part of unspecified bronchus or lung: Secondary | ICD-10-CM | POA: Diagnosis not present

## 2021-07-08 DIAGNOSIS — Z006 Encounter for examination for normal comparison and control in clinical research program: Secondary | ICD-10-CM | POA: Diagnosis not present

## 2021-07-08 DIAGNOSIS — Z9221 Personal history of antineoplastic chemotherapy: Secondary | ICD-10-CM | POA: Diagnosis not present

## 2021-07-08 DIAGNOSIS — Z853 Personal history of malignant neoplasm of breast: Secondary | ICD-10-CM | POA: Insufficient documentation

## 2021-07-08 DIAGNOSIS — Z923 Personal history of irradiation: Secondary | ICD-10-CM | POA: Insufficient documentation

## 2021-07-08 DIAGNOSIS — Z85048 Personal history of other malignant neoplasm of rectum, rectosigmoid junction, and anus: Secondary | ICD-10-CM | POA: Diagnosis not present

## 2021-07-08 DIAGNOSIS — Z79899 Other long term (current) drug therapy: Secondary | ICD-10-CM | POA: Diagnosis not present

## 2021-07-08 LAB — C DIFFICILE QUICK SCREEN W PCR REFLEX
C Diff antigen: NEGATIVE
C Diff interpretation: NOT DETECTED
C Diff toxin: NEGATIVE

## 2021-07-08 LAB — MAGNESIUM: Magnesium: 1.2 mg/dL — ABNORMAL LOW (ref 1.7–2.4)

## 2021-07-08 LAB — CMP (CANCER CENTER ONLY)
ALT: 19 U/L (ref 0–44)
AST: 33 U/L (ref 15–41)
Albumin: 4.2 g/dL (ref 3.5–5.0)
Alkaline Phosphatase: 55 U/L (ref 38–126)
Anion gap: 8 (ref 5–15)
BUN: 14 mg/dL (ref 6–20)
CO2: 30 mmol/L (ref 22–32)
Calcium: 9.7 mg/dL (ref 8.9–10.3)
Chloride: 97 mmol/L — ABNORMAL LOW (ref 98–111)
Creatinine: 1.02 mg/dL — ABNORMAL HIGH (ref 0.44–1.00)
GFR, Estimated: >60 mL/min (ref >60)
Glucose, Bld: 97 mg/dL (ref 70–99)
Potassium: 4.1 mmol/L (ref 3.5–5.1)
Sodium: 135 mmol/L (ref 135–145)
Total Bilirubin: 0.5 mg/dL (ref 0.3–1.2)
Total Protein: 8 g/dL (ref 6.5–8.1)

## 2021-07-08 LAB — CBC WITH DIFFERENTIAL (CANCER CENTER ONLY)
Abs Immature Granulocytes: 0.03 10*3/uL (ref 0.00–0.07)
Basophils Absolute: 0.1 10*3/uL (ref 0.0–0.1)
Basophils Relative: 1 %
Eosinophils Absolute: 0.1 10*3/uL (ref 0.0–0.5)
Eosinophils Relative: 2 %
HCT: 42.7 % (ref 36.0–46.0)
Hemoglobin: 14.4 g/dL (ref 12.0–15.0)
Immature Granulocytes: 1 %
Lymphocytes Relative: 13 %
Lymphs Abs: 0.8 10*3/uL (ref 0.7–4.0)
MCH: 35.8 pg — ABNORMAL HIGH (ref 26.0–34.0)
MCHC: 33.7 g/dL (ref 30.0–36.0)
MCV: 106.2 fL — ABNORMAL HIGH (ref 80.0–100.0)
Monocytes Absolute: 0.8 10*3/uL (ref 0.1–1.0)
Monocytes Relative: 12 %
Neutro Abs: 4.6 10*3/uL (ref 1.7–7.7)
Neutrophils Relative %: 71 %
Platelet Count: 256 10*3/uL (ref 150–400)
RBC: 4.02 MIL/uL (ref 3.87–5.11)
RDW: 12.2 % (ref 11.5–15.5)
WBC Count: 6.3 10*3/uL (ref 4.0–10.5)
nRBC: 0 % (ref 0.0–0.2)

## 2021-07-08 MED ORDER — OPIUM 10 MG/ML (1%) PO TINC
6.0000 mg | Freq: Four times a day (QID) | ORAL | 0 refills | Status: DC | PRN
  Filled 2021-07-08: qty 25, 11d supply, fill #0

## 2021-07-08 MED ORDER — SODIUM CHLORIDE 0.9 % IV SOLN: INTRAVENOUS | Status: AC

## 2021-07-08 MED ORDER — MAGNESIUM SULFATE 4 GM/100ML IV SOLN
4.0000 g | Freq: Once | INTRAVENOUS | Status: AC
  Administered 2021-07-08: 4 g via INTRAVENOUS
  Filled 2021-07-08: qty 100

## 2021-07-08 NOTE — Patient Instructions (Signed)
Rehydration, Adult Rehydration is the replacement of body fluids, salts, and minerals (electrolytes) that are lost during dehydration. Dehydration is when there is not enough water or other fluids in the body. This happens when you lose more fluids than you take in. Common causes of dehydration include: Not drinking enough fluids. This can occur when you are ill or doing activities that require a lot of energy, especially in hot weather. Conditions that cause loss of water or other fluids, such as diarrhea, vomiting, sweating, or urinating a lot. Other illnesses, such as fever or infection. Certain medicines, such as those that remove excess fluid from the body (diuretics). Symptoms of mild or moderate dehydration may include thirst, dry lips and mouth, and dizziness. Symptoms of severe dehydration may include increased heart rate, confusion, fainting, and not urinating. For severe dehydration, you may need to get fluids through an IV at the hospital. For mild or moderate dehydration, you can usually rehydrate at home by drinking certain fluids as told by your health care provider. What are the risks? Generally, rehydration is safe. However, taking in too much fluid (overhydration) can be a problem. This is rare. Overhydration can cause an electrolyte imbalance, kidney failure, or a decrease in salt (sodium) levels in the body. Supplies needed You will need an oral rehydration solution (ORS) if your health care provider tells you to use one. This is a drink to treat dehydration. It can be found in pharmacies and retail stores. How to rehydrate Fluids Follow instructions from your health care provider for rehydration. The kind of fluid and the amount you should drink depend on your condition. In general, you should choose drinks that you prefer. If told by your health care provider, drink an ORS. Make an ORS by following instructions on the package. Start by drinking small amounts, about  cup (120  mL) every 5-10 minutes. Slowly increase how much you drink until you have taken the amount recommended by your health care provider. Drink enough clear fluids to keep your urine pale yellow. If you were told to drink an ORS, finish it first, then start slowly drinking other clear fluids. Drink fluids such as: Water. This includes sparkling water and flavored water. Drinking only water can lead to having too little sodium in your body (hyponatremia). Follow the advice of your health care provider. Water from ice chips you suck on. Fruit juice with water you add to it (diluted). Sports drinks. Hot or cold herbal teas. Broth-based soups. Milk or milk products. Food Follow instructions from your health care provider about what to eat while you rehydrate. Your health care provider may recommend that you slowly begin eating regular foods in small amounts. Eat foods that contain a healthy balance of electrolytes, such as bananas, oranges, potatoes, tomatoes, and spinach. Avoid foods that are greasy or contain a lot of sugar. In some cases, you may get nutrition through a feeding tube that is passed through your nose and into your stomach (nasogastric tube, or NG tube). This may be done if you have uncontrolled vomiting or diarrhea. Beverages to avoid Certain beverages may make dehydration worse. While you rehydrate, avoid drinking alcohol. How to tell if you are recovering from dehydration You may be recovering from dehydration if: You are urinating more often than before you started rehydrating. Your urine is pale yellow. Your energy level improves. You vomit less frequently. You have diarrhea less frequently. Your appetite improves or returns to normal. You feel less dizzy or less light-headed. Your  skin tone and color start to look more normal. Follow these instructions at home: Take over-the-counter and prescription medicines only as told by your health care provider. Do not take sodium  tablets. Doing this can lead to having too much sodium in your body (hypernatremia). Contact a health care provider if: You continue to have symptoms of mild or moderate dehydration, such as: Thirst. Dry lips. Slightly dry mouth. Dizziness. Dark urine or less urine than normal. Muscle cramps. You continue to vomit or have diarrhea. Get help right away if you: Have symptoms of dehydration that get worse. Have a fever. Have a severe headache. Have been vomiting and the following happens: Your vomiting gets worse or does not go away. Your vomit includes blood or green matter (bile). You cannot eat or drink without vomiting. Have problems with urination or bowel movements, such as: Diarrhea that gets worse or does not go away. Blood in your stool (feces). This may cause stool to look black and tarry. Not urinating, or urinating only a small amount of very dark urine, within 6-8 hours. Have trouble breathing. Have symptoms that get worse with treatment. These symptoms may represent a serious problem that is an emergency. Do not wait to see if the symptoms will go away. Get medical help right away. Call your local emergency services (911 in the [place] Do not drive yourself to the hospital. Summary Rehydration is the replacement of body fluids and minerals (electrolytes) that are lost during dehydration. Follow instructions from your health care provider for rehydration. The kind of fluid and amount you should drink depend on your condition. Slowly increase how much you drink until you have taken the amount recommended by your health care provider. Contact your health care provider if you continue to show signs of mild or moderate dehydration. This information is not intended to replace advice given to you by your health care provider. Make sure you discuss any questions you have with your health care provider. Document Revised: 06/25/2019 Document Reviewed: 05/05/2019 Elsevier Patient  Education  2022 [place].  Hypomagnesemia Hypomagnesemia is a condition in which the level of magnesium in the blood is too low. Magnesium is a mineral that is found in many foods. It is used in many different processes in the body. Hypomagnesemia can affect every organ in the body. In severe cases, it can cause life-threatening problems. What are the causes? This condition may be caused by: Not getting enough magnesium in your diet or not having enough healthy foods to eat (malnutrition). Problems with magnesium absorption in the intestines. Dehydration. Excessive use of alcohol. Vomiting. Severe or long-term (chronic) diarrhea. Some medicines, including medicines that make you urinate more often (diuretics). Certain diseases, such as kidney disease, diabetes, celiac disease, and overactive thyroid. What are the signs or symptoms? Symptoms of this condition include: Loss of appetite, nausea, and vomiting. Involuntary shaking or trembling of a body part (tremor). Muscle weakness or tingling in the arms and legs. Sudden tightening of muscles (muscle spasms). Confusion. Psychiatric issues, such as: Depression and irritability. Psychosis. A feeling of fluttering of the heart (palpitations). Seizures. These symptoms are more severe if magnesium levels drop suddenly. How is this diagnosed? This condition may be diagnosed based on: Your symptoms and medical history. A physical exam. Blood and urine tests. How is this treated? Treatment depends on the cause and the severity of the condition. It may be treated by: Taking a magnesium supplement. This can be taken in pill form. If the condition  is severe, magnesium is usually given through an IV. Making changes to your diet. You may be directed to eat foods that have a lot of magnesium, such as green leafy vegetables, peas, beans, and nuts. Not drinking alcohol. If you are struggling not to drink, ask your health care provider for  help. Follow these instructions at home: Eating and drinking   Make sure that your diet includes foods with magnesium. Foods that have a lot of magnesium in them include: Green leafy vegetables, such as spinach and broccoli. Beans and peas. Nuts and seeds, such as almonds and sunflower seeds. Whole grains, such as whole grain bread and fortified cereals. Drink fluids that contain salts and minerals (electrolytes), such as sports drinks, when you are active. Do not drink alcohol. General instructions Take over-the-counter and prescription medicines only as told by your health care provider. Take magnesium supplements as directed if your health care provider tells you to take them. Have your magnesium levels monitored as told by your health care provider. Keep all follow-up visits. This is important. Contact a health care provider if: You get worse instead of better. Your symptoms return. Get help right away if: You develop severe muscle weakness. You have trouble breathing. You feel that your heart is racing. These symptoms may represent a serious problem that is an emergency. Do not wait to see if the symptoms will go away. Get medical help right away. Call your local emergency services (911 in the [place] Do not drive yourself to the hospital. Summary Hypomagnesemia is a condition in which the level of magnesium in the blood is too low. Hypomagnesemia can affect every organ in the body. Treatment may include eating more foods that contain magnesium, taking magnesium supplements, and not drinking alcohol. Have your magnesium levels monitored as told by your health care provider. This information is not intended to replace advice given to you by your health care provider. Make sure you discuss any questions you have with your health care provider. Document Revised: 09/21/2020 Document Reviewed: 09/21/2020 Elsevier Patient Education  [place].

## 2021-07-08 NOTE — Telephone Encounter (Signed)
Correspondence from Williams L in reference to Pt., Pt has been having uncontrollable diarrhea for 8 days now after stopping clinical trial drug Sotorasib . Pt was prescribed lomotil on 07/01/21 and was instructed to take imodium and lomotil to decrease diarrhea.Pt stated, she did not take the imodium because she felt it did nothing for her. Pt states she has already had 5 episodes this morning of diarrhea and it smells horrible. Pt is very concerned. Discussed with Dr Benay Spice. Pt scheduled to come in to the office today. ?

## 2021-07-08 NOTE — Progress Notes (Signed)
[facility] OFFICE PROGRESS NOTE   Diagnosis: Non-small cell lung cancer.  INTERVAL HISTORY:   Ms. Witherow returns prior to scheduled follow-up due to diarrhea.  Sotorasib placed on hold beginning 06/30/2021.  The diarrhea has persisted, multiple times a day.  Volume tends to be large.  Stool described as watery.  No bleeding.  No fever.  She has mild nausea.  Some cramping abdominal pain.  No shortness of breath.  Imodium not effective.  Lomotil initially seem to be helping but not recently.  She is tolerating fluids.  She is trying to eat a bland diet.  Objective:  Vital signs in last 24 hours:  Blood pressure 110/67, pulse 97, temperature 97.8 F (36.6 C), temperature source Oral, resp. rate 18, height 5' 5" (1.651 m), weight 160 lb 3.2 oz (72.7 kg), last menstrual period 04/07/2008, SpO2 96 %.    HEENT: No thrush or ulcers.  Mucous membranes appear moist. Resp: Lungs clear bilaterally. Cardio: Regular rate and rhythm. GI: Abdomen soft and nontender.  No hepatosplenomegaly. Vascular: No leg edema. Skin: Skin turgor intact.  Mild erythema and induration at the right anterior chest Port-A-Cath site.   Lab Results:  Lab Results  Component Value Date   WBC 6.3 07/08/2021   HGB 14.4 07/08/2021   HCT 42.7 07/08/2021   MCV 106.2 (H) 07/08/2021   PLT 256 07/08/2021   NEUTROABS 4.6 07/08/2021    Imaging:  CT Chest Wo Contrast  Result Date: 07/08/2021 CLINICAL DATA:  Non-small cell lung cancer restaging EXAM: CT CHEST WITHOUT CONTRAST TECHNIQUE: Multidetector CT imaging of the chest was performed following the standard protocol without IV contrast. RADIATION DOSE REDUCTION: This exam was performed according to the departmental dose-optimization program which includes automated exposure control, adjustment of the mA and/or kV according to patient size and/or use of iterative reconstruction technique. COMPARISON:  05/20/2021 FINDINGS: Cardiovascular: Coronary, aortic  arch, and branch vessel atherosclerotic vascular disease. Mediastinum/Nodes: Right lower paratracheal node 0.7 cm in short axis on image 59 series 2, not pathologically enlarged. Overall no pathologic adenopathy is identified. Lungs/Pleura: Small right pleural effusion similar to prior. Right suprahilar and paramediastinal density measures 3.1 by 2.5 cm on image 47 series 4, previously same when measured in a similar fashion. Subjectively the appearance is not substantially changed morphologically. The primarily right lower lobe nodularity with adjacent bandlike density and internal air bronchograms measures 3.1 by 2.2 cm on image 94 series 4, formerly the same when measured in a similar fashion on the prior exam. Subjectively there is no substantial morphologic change. Subpleural reticulation anteriorly in the right lung is unchanged, possibly from prior breast cancer therapy. Scarring and atelectasis noted inferiorly in the right lower lobe, unchanged. Stable subpleural scarring and mild subpleural nodularity in the right upper lobe including a 5 by 3 mm subpleural nodule on image 47 series 4. Wedge resection clips noted along with adjacent scarring in the left upper lobe, unchanged morphology. Ground-glass density nodules present in the left upper lobe and left lower lobe. A ground-glass density left lower lobe nodule measures 1.5 by 1.2 cm on image 96 series 4, previously the same when measured in the same fashion on 05/20/2021. Going back further to 04/22/2018, this nodule measured 1.3 by 0.9 cm, indicating very slow growth over the last several years. The posterior ground-glass density nodule in the left upper lobe on image 74 series 4 measures 1.6 by 0.8 cm, formerly 1.7 by 0.8 cm by my measurements, no substantial morphologic  changes. Back on 04/22/2018 this measured about 1.3 by 0.8 cm. The other smaller ground-glass density nodules in the left upper lobe appear stable as well. Upper Abdomen: Dependent  density in the gallbladder possibly from sludge or gallstones. Abdominal aortic atherosclerosis. No appreciable adrenal mass. Musculoskeletal: Unremarkable RECIST 1.1 Target Lesions: 1. Right upper lobe suprahilar nodule measuring 3.1 cm (image 47, series 4). 2. Right lower lobe nodular consolidation measuring 3.1 cm (image 94 series 4). Non-target Lesions: 1. Satellite nodularity along the margin of the right lower lobe lesion anteriorly, 0.8 cm in transverse diameter on image 93 series 4. IMPRESSION: 1. Overall similar appearance and morphology of the right suprahilar and infrahilar nodular opacities favoring treated malignancy. 2. Ground-glass density nodules in the left lower lobe and left upper lobe are stable from 05/20/2021 and moderately progressed from 04/22/2018. Indolent low-grade adenocarcinoma is a distinct possibility. 3. No pathologic mediastinal adenopathy. 4. Other imaging findings of potential clinical significance: Aortic Atherosclerosis (ICD10-I70.0). Coronary and abdominal aortic atherosclerosis. Prior wedge resection of the left upper lobe. * onc * Electronically Signed   By: Van Clines M.D.   On: 07/08/2021 11:27    Medications: I have reviewed the patient's current medications.  Assessment/Plan: Anal cancer-left anal margin/anal canal mass, status post a biopsy on 08/09/2012 confirming invasive moderately differentiated squamous cell carcinoma,p16 positive.   -Staging PET scan 08/20/2012-hypermetabolic anal mass, no hypermetabolic metastases   -Initiation of concurrent radiation with cycle 1 of mitomycin C./5-fluorouracil 09/02/2012.   -She completed cycle 2 5-fluorouracil/mitomycin C. beginning 10/01/2012.   -She completed radiation 10/10/2012.   2. Right breast cancer 2009, ER positive, PR positive, HER-2 positive. She is status post neoadjuvant AC x4 cycles followed by Taxol/Herceptin. She underwent a right lumpectomy and sentinel lymph node biopsy 09/22/2008 with no  evidence of residual invasive carcinoma and 5 negative sentinel lymph nodes. She then completed right breast radiation. She began tamoxifen 12/15/2008 and completed adjuvant Herceptin 05/25/2009. The tamoxifen was discontinued and she was switched to Arimidex in July 2013.  Arimidex discontinued in mid July 2019. 3. History of enlargement of the right tonsil.   4. Nonspecific pulmonary nodules without hypermetabolic activity on the PET scan 08/20/2012. Chest CT 10/21/2012 with scattered pulmonary nodules including nodules that were not present in 2010. Annual surveillance was recommended. Follow-up chest CT 10/24/2013 with scattered groundglass densities again noted in both lungs mostly unchanged when compared to the previous study. A sub-solid right lower lobe nodule had increased central density. The size was unchanged.The nodule has been present since 2009. 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 No EGFR, ALK, BRAF, RET, ERBB2, or ROS1 alteration CT chest 11/14/2016-status post left upper lobe wedge resection, stable right lung nodules Chest CT 05/02/2017-stable bilateral solid and groundglass nodules, stable mild mediastinal lymphadenopathy CT chest 12/13/2017- enlargement of right upper lobe  nodule, other lung nodules and  chest lymph node stable PET 01/09/2018- right upper lobe nodule and right lower lobe nodule have enlarged since 2018 and have low level metabolic activity increased size of an 8 mm right upper lobe nodule without increased metabolic activity, stable activity and a mildly enlarged lower paratracheal node, no evidence of metastatic disease in the abdomen or pelvis CT chest 04/22/2018- no new lung nodules, dominant right lung nodules slightly increased in size Bronchoscopy/EBUS 05/04/2019 with biopsy of level 7 node, biopsy of right lower lobe nodule, FNA of right lower lobe nodule-negative CT biopsy of right upper lobe nodule 06/07/2018- adenocarcinoma with lepidic and acinar patterns SBRT to the dominant right upper lobe nodule 07/23/2018-07/30/2018 CT 09/16/2018-slight increase in size of medial right upper lobe nodule-possibly secondary to interval radiation, other nodules are unchanged CT 12/25/2018- medial right upper lobe nodule has decreased in size slightly, multiple additional solid and groundglass nodules are stable CT 06/09/2019-enlarging right lower lobe nodule, enlarging subsolid nodule, new right upper lobe nodule SBRT to right lower lobe lung nodule, 3 fractions 07/08/2019-07/14/2019 CT 12/05/2019-dominant right lower lobe lesion stable to slightly decreased in size, progressive solid component associated with a dominant right upper lung nodule, mild progression of a left upper lobe nodule, new subsolid anterior medial left upper lobe nodule Cycle 1 Alimta/carboplatin/pembrolizumab 02/04/2020 Cycle 2 Alimta/carboplatin/pembrolizumab 02/23/2020 Cycle 3 Alimta/carboplatin/pembrolizumab 03/15/2020 Cycle 4 Alimta/carboplatin/pembrolizumab 04/05/2020 (Alimta and carboplatin dose reduced secondary to anemia) CT chest 04/12/2020-decreased size of solid right lower lobe and right upper lobe nodules, groundglass nodules in the left lung unchanged Cycle 5 Alimta/pembrolizumab  05/03/2020 Cycle 6 Alimta/pembrolizumab 05/21/2020 Cycle 7 Alimta/pembrolizumab 06/14/2020 Cycle 8 Alimta/pembrolizumab 07/05/2020 CT chest 07/22/2020-stable nodules, no evidence of disease progression, unchanged multifocal groundglass nodules suspicious for multifocal adenocarcinoma. Cycle 9 Alimta/Pembrolizumab 07/26/2020 Cycle 10 Alimta/Pembrolizumab 08/16/2020 Cycle 11 Alimta/Pembrolizumab 09/06/2020 Cycle 12 Alimta/pembrolizumab 09/27/2020 Cycle 13 Alimta/pembrolizumab 10/18/2020 CT chest 11/12/2020-enlarging soft tissue density right upper lobe medially adjacent to the mediastinum measuring 2.7 x 2.1 cm, concerning for recurrent tumor.  Stable radiation changes involving the right upper lobe and right lower lobe.  Stable scattered small solid and subsolid pulmonary nodules bilaterally.  New right-sided pleural effusion.  Stable borderline enlarged mediastinal lymph nodes. Bronchoscopy 12/28/2020-right upper lobe and left lower lobe lesions biopsy-negative pathology PET 1/79/1505-WPV metabolic activity involving the right suprahilar consolidative process-favored benign, measured smaller, additional groundglass densities with very low-level metabolic activity, radiation change in the right lower lobe, resolution of right pleural effusion, no distant metastatic disease Cycle 14 Alimta/pembrolizumab 01/19/2021 Cycle 15 Alimta/pembrolizumab 02/07/2021 Cycle 16 of Alimta/pembrolizumab 02/28/2021 Cycle 17 Alimta/pembrolizumab 03/28/2021 CT chest 04/18/2021-increased areas of masslike density in the right upper lobe and right lower lobe with a new right lower lobe satellite nodule, other groundglass attenuation lesions are stable, slight decrease in size of chronic right pleural effusion CT head 05/06/2021-no evidence of metastatic disease CT chest 05/20/2018-right upper lobe and right lower lobe masses are unchanged, groundglass nodules in the left lung are unchanged, no mediastinal adenopathy, resist measurements  of target lesions given 05/30/2021 SWOG [id number]-sotorasib Sotorasib placed on hold 06/30/2021 5.  Baker's cyst left leg, pain and swelling in the left calf, negative Doppler 01/24/2020 and 01/29/2020-evaluated by orthopedics and underwent aspiration of the cyst 01/29/2000, completed a course of Keflex 6.  Anemia secondary to chemotherapy, status post 2 units of packed red blood cells on 04/10/2020-improved 7.  Port-A-Cath placement 01/12/2021; Port-A-Cath removal 02/24/2021 due to infection, culture positive for MRSA, status post 2 courses of doxycycline, Bactrim DS started 03/28/2021, Bactrim DS  resumed 04/26/2021 8.  Diarrhea beginning 06/21/2021-sotorasib discontinued 07/01/2021      Disposition: Ms. Hitch appears stable.  She continues to have significant diarrhea.  Sotorasib has been on hold since 06/30/2021.  Imodium and Lomotil not effective.  C. difficile test is pending.  She will receive IV fluids and magnesium replacement today.  If C. difficile test is negative we will prescribe tincture of opium for the diarrhea.  She will continue to push fluids orally.  She had a chest CT yesterday.  We had preliminary discussion regarding the radiologists reading.  This will be discussed further at her visit 07/11/2021.  She will return as scheduled on 07/11/2021.  Patient seen with Dr. Benay Spice.    Ned Card ANP/GNP-BC   07/08/2021  12:57 PM This was a shared visit with Ned Card.  Ms. Schissler was interviewed and examined.  She has persistent diarrhea, potentially related to the sotorasib or another etiology.  We obtained a stool sample for C. difficile testing today.  She will begin treatment for C. difficile as indicated.  If the C. difficile test is negative the plan is to begin a trial of tincture of opium. She will receive intravenous fluids and magnesium today.  She will return for an office visit as scheduled 07/11/2021 I was present for greater than 50% of today's visit.  I performed medical  decision making.  Julieanne Manson, MD

## 2021-07-08 NOTE — Telephone Encounter (Signed)
S1900E, A PHASE II STUDY OF SOTORASIB (AMG 510) IN PARTICIPANTS WITH PREVIOUSLY TREATED STAGE IV OR RECURRENT KRAS G12C MUTATED NON-SQUAMOUS NON-SMALL CELL LUNG CANCER (ECOG-ACRIN LUNG-MAP SUB-STUDY ? ?Incoming email ?Received email ,this morning, from patient informing me that diarrhea continues and worsening. Per patient, no resolution with lomotil and patient states, per her scale, she's lost ten pounds. Patient's email forwarded to Dr. Benay Spice and his nurse, Santiago Glad, for MD assessment of patient.  ?Patient was scheduled for MD visit for assessment and assessments this morning. ?Patient to have labs with c-difficile check, magnesium, CMP and CBC, as well as fluids.  ? ?Patient was told to continue to hold study drug till further notice.  ?Patient thanked and encouraged to call.  ?Maxwell Marion, RN, BSN, [place] ?Clinical Research Nurse Lead ?07/08/2021 12:13 PM ? ?

## 2021-07-10 ENCOUNTER — Encounter (HOSPITAL_BASED_OUTPATIENT_CLINIC_OR_DEPARTMENT_OTHER): Payer: Self-pay | Admitting: Emergency Medicine

## 2021-07-10 ENCOUNTER — Emergency Department (HOSPITAL_BASED_OUTPATIENT_CLINIC_OR_DEPARTMENT_OTHER)
Admission: EM | Admit: 2021-07-10 | Discharge: 2021-07-10 | Disposition: A | Discharge status: Home / Self Care | Payer: BC Managed Care – PPO | Attending: Emergency Medicine | Admitting: Emergency Medicine

## 2021-07-10 ENCOUNTER — Emergency Department (HOSPITAL_BASED_OUTPATIENT_CLINIC_OR_DEPARTMENT_OTHER): Payer: BC Managed Care – PPO

## 2021-07-10 ENCOUNTER — Other Ambulatory Visit: Payer: Self-pay

## 2021-07-10 DIAGNOSIS — Z85118 Personal history of other malignant neoplasm of bronchus and lung: Secondary | ICD-10-CM | POA: Diagnosis not present

## 2021-07-10 DIAGNOSIS — K625 Hemorrhage of anus and rectum: Secondary | ICD-10-CM | POA: Diagnosis not present

## 2021-07-10 DIAGNOSIS — R109 Unspecified abdominal pain: Secondary | ICD-10-CM

## 2021-07-10 DIAGNOSIS — I700 Atherosclerosis of aorta: Secondary | ICD-10-CM | POA: Diagnosis not present

## 2021-07-10 DIAGNOSIS — R197 Diarrhea, unspecified: Secondary | ICD-10-CM

## 2021-07-10 DIAGNOSIS — Z85048 Personal history of other malignant neoplasm of rectum, rectosigmoid junction, and anus: Secondary | ICD-10-CM | POA: Insufficient documentation

## 2021-07-10 DIAGNOSIS — Z853 Personal history of malignant neoplasm of breast: Secondary | ICD-10-CM | POA: Insufficient documentation

## 2021-07-10 DIAGNOSIS — K921 Melena: Secondary | ICD-10-CM | POA: Diagnosis not present

## 2021-07-10 DIAGNOSIS — R1032 Left lower quadrant pain: Secondary | ICD-10-CM | POA: Diagnosis not present

## 2021-07-10 LAB — CBC
HCT: 43.6 % (ref 36.0–46.0)
Hemoglobin: 14.6 g/dL (ref 12.0–15.0)
MCH: 35.8 pg — ABNORMAL HIGH (ref 26.0–34.0)
MCHC: 33.5 g/dL (ref 30.0–36.0)
MCV: 106.9 fL — ABNORMAL HIGH (ref 80.0–100.0)
Platelets: 249 10*3/uL (ref 150–400)
RBC: 4.08 MIL/uL (ref 3.87–5.11)
RDW: 12.5 % (ref 11.5–15.5)
WBC: 7.6 10*3/uL (ref 4.0–10.5)
nRBC: 0 % (ref 0.0–0.2)

## 2021-07-10 LAB — COMPREHENSIVE METABOLIC PANEL
ALT: 18 U/L (ref 0–44)
AST: 31 U/L (ref 15–41)
Albumin: 4 g/dL (ref 3.5–5.0)
Alkaline Phosphatase: 54 U/L (ref 38–126)
Anion gap: 10 (ref 5–15)
BUN: 11 mg/dL (ref 6–20)
CO2: 25 mmol/L (ref 22–32)
Calcium: 9.3 mg/dL (ref 8.9–10.3)
Chloride: 102 mmol/L (ref 98–111)
Creatinine, Ser: 0.81 mg/dL (ref 0.44–1.00)
GFR, Estimated: >60 mL/min (ref >60)
Glucose, Bld: 100 mg/dL — ABNORMAL HIGH (ref 70–99)
Potassium: 4.3 mmol/L (ref 3.5–5.1)
Sodium: 137 mmol/L (ref 135–145)
Total Bilirubin: 0.4 mg/dL (ref 0.3–1.2)
Total Protein: 7.8 g/dL (ref 6.5–8.1)

## 2021-07-10 LAB — MAGNESIUM: Magnesium: 1.4 mg/dL — ABNORMAL LOW (ref 1.7–2.4)

## 2021-07-10 LAB — LIPASE, BLOOD: Lipase: 38 U/L (ref 11–51)

## 2021-07-10 MED ORDER — LACTATED RINGERS IV BOLUS
1000.0000 mL | Freq: Once | INTRAVENOUS | Status: AC
  Administered 2021-07-10: 1000 mL via INTRAVENOUS

## 2021-07-10 MED ORDER — MAGNESIUM SULFATE 2 GM/50ML IV SOLN
2.0000 g | Freq: Once | INTRAVENOUS | Status: AC
  Administered 2021-07-10: 2 g via INTRAVENOUS
  Filled 2021-07-10: qty 50

## 2021-07-10 MED ORDER — IOHEXOL 300 MG/ML  SOLN
100.0000 mL | Freq: Once | INTRAMUSCULAR | Status: AC | PRN
  Administered 2021-07-10: 80 mL via INTRAVENOUS

## 2021-07-10 NOTE — ED Notes (Signed)
2 unsuccessful attempts at IV insertion by this RN.  Pt denies ability to provide urine sample at this time. ?

## 2021-07-10 NOTE — ED Triage Notes (Signed)
Pt reports diarrhea for 3 weeks. Pt thought it was related to trial cancer drug and stopped taking it a week ago. Started on opium Friday by Dr. Benay Spice but no improvement. Negative c-diff on Friday. Endorses lower abd cramping.  ?

## 2021-07-10 NOTE — ED Notes (Signed)
Dr Billy Fischer in room w/pt now. ?

## 2021-07-10 NOTE — ED Notes (Signed)
Patient aware of need for urine sample ?

## 2021-07-10 NOTE — ED Provider Notes (Signed)
[facility] EMERGENCY DEPT Provider Note   CSN: [id number] Arrival date & time: 07/10/21  1118     History  Chief Complaint  Patient presents with   Diarrhea    Gina Cervantes is a 61 y.o. female.  HPI     61yo female with history of anal cancer, breast cancer, currently treated for non-small cell lung cancer recently on sotorasib which was discontinued with possibility it contributed to diarrhea who presents with concern for 3 weeks of diarrhea with abdominal cramping and blood per rectum today.  Has had 3 weeks of severe diarrhea, reports 20 episodes per day.  Not having vomiting.  Did not have bloody stool until today, had BM and passed a few small blood clots or mucous "globules" .  Today had abdominal pain for the first time since the diarrhea started. Severe cramping pain. No fever. Had negative cdfif test. No known sick contacts.   Past Medical History:  Diagnosis Date   Anal cancer ([place]) 08/09/2012   Squamous cell   Anxiety    PANIC ATTACKS   Arthritis    Breast cancer ([place]) 2009   ER+PR+HER-2+   Ductal carcinoma ([place]) 02/2008   invasive    Heart palpitations    History of radiation therapy 09/02/12-10/10/12   anal 50.4Gy   History of shingles 10/2014   HPV (human papilloma virus) anogenital infection    vulvar/ freezing hx   Hx of radiation therapy 2020   Lung nodule    Malignant neoplasm of upper lobe of left lung ([place]) 06/2016   Personal history of chemotherapy    Personal history of radiation therapy 2010   Pneumonia    NO RECENT PROBLEMS   PONV (postoperative nausea and vomiting)    Hypotension   Radiation 10/21/08-12/08/08   Right breast 6240 cGy   Shingles 2017   Status post chemotherapy 02/2008   Taxol/Herceptin    Past Surgical History:  Procedure Laterality Date   BREAST BIOPSY Right 02/04/2008   malignant   BREAST LUMPECTOMY Right 2010   malignant   BREAST LUMPECTOMY WITH AXILLARY LYMPH NODE BIOPSY Right 5/10   Dr. Marlou Starks    BRONCHIAL BIOPSY  12/28/2020   Procedure: BRONCHIAL BIOPSIES;  Surgeon: Garner Nash, DO;  Location: [facility];  Service: Pulmonary;;   BRONCHIAL BRUSHINGS  12/28/2020   Procedure: BRONCHIAL BRUSHINGS;  Surgeon: Garner Nash, DO;  Location: [facility];  Service: Pulmonary;;   BRONCHIAL NEEDLE ASPIRATION BIOPSY  12/28/2020   Procedure: BRONCHIAL NEEDLE ASPIRATION BIOPSIES;  Surgeon: Garner Nash, DO;  Location: [facility];  Service: Pulmonary;;   BRONCHIAL WASHINGS  12/28/2020   Procedure: BRONCHIAL WASHINGS;  Surgeon: Garner Nash, DO;  Location: [facility];  Service: Pulmonary;;   CESAREAN SECTION     COLONOSCOPY W/ POLYPECTOMY     ENDOBRONCHIAL ULTRASOUND  12/28/2020   Procedure: ENDOBRONCHIAL ULTRASOUND;  Surgeon: Garner Nash, DO;  Location: [facility];  Service: Pulmonary;;   EVALUATION UNDER ANESTHESIA WITH ANAL FISTULECTOMY N/A 08/09/2012   Procedure: EXAM UNDER ANESTHESIA AND BIOPSY OF ANAL MASS;  Surgeon: Odis Hollingshead, MD;  Location: [facility];  Service: General;  Laterality: N/A;   IR IMAGING GUIDED PORT INSERTION  01/12/2021   IR RADIOLOGIST EVAL & MGMT  03/11/2021   IR REMOVAL TUN ACCESS W/ PORT W/O FL MOD SED  02/24/2021   KNEE ARTHROSCOPY Left    left knee   LUNG LOBECTOMY Left    PORTACATH PLACEMENT  03/2008   dr.  toth    REMOVAL PORTACATH  2011   VIDEO ASSISTED THORACOSCOPY (VATS)/WEDGE RESECTION Left 07/05/2016   Procedure: VIDEO ASSISTED THORACOSCOPY (VATS)/WEDGE RESECTION left upper lobe,  lymph node dissection and placement of OnQ catheter;  Surgeon: [name], MD;  Location: [facility];  Service: Thoracic;  Laterality: Left;   VIDEO BRONCHOSCOPY N/A 07/05/2016   Procedure: VIDEO BRONCHOSCOPY;  Surgeon: [name], MD;  Location: [facility];  Service: Thoracic;  Laterality: N/A;   VIDEO BRONCHOSCOPY N/A 05/03/2018   Procedure: VIDEO BRONCHOSCOPY;  Surgeon: [name], MD;  Location: [facility];  Service: Thoracic;  Laterality: N/A;   VIDEO  BRONCHOSCOPY WITH ENDOBRONCHIAL NAVIGATION N/A 05/03/2018   Procedure: VIDEO BRONCHOSCOPY WITH ENDOBRONCHIAL NAVIGATION WITH BIOPSY;  Surgeon: [name], MD;  Location: [facility];  Service: Thoracic;  Laterality: N/A;   VIDEO BRONCHOSCOPY WITH ENDOBRONCHIAL NAVIGATION Bilateral 12/28/2020   Procedure: VIDEO BRONCHOSCOPY WITH ENDOBRONCHIAL NAVIGATION;  Surgeon: Garner Nash, DO;  Location: [facility];  Service: Pulmonary;  Laterality: Bilateral;  ION   VIDEO BRONCHOSCOPY WITH ENDOBRONCHIAL ULTRASOUND N/A 05/03/2018   Procedure: VIDEO BRONCHOSCOPY WITH ENDOBRONCHIAL ULTRASOUND;  Surgeon: [name], MD;  Location: [facility];  Service: Thoracic;  Laterality: N/A;   VIDEO BRONCHOSCOPY WITH RADIAL ENDOBRONCHIAL ULTRASOUND  12/28/2020   Procedure: RADIAL ENDOBRONCHIAL ULTRASOUND;  Surgeon: Garner Nash, DO;  Location: [facility];  Service: Pulmonary;;    Home Medications Prior to Admission medications   Medication Sig Start Date End Date Taking? Authorizing Provider  albuterol (VENTOLIN HFA) 108 (90 Base) MCG/ACT inhaler TAKE 2 PUFFS BY MOUTH EVERY 6 HOURS AS NEEDED FOR WHEEZE OR SHORTNESS OF BREATH 02/07/21   Ladell Pier, MD  Budeson-Glycopyrrol-Formoterol (BREZTRI AEROSPHERE) 160-9-4.8 MCG/ACT AERO Inhale 2 puffs into the lungs in the morning and at bedtime. 01/04/21   June Leap L, DO  CALCIUM PO Take 1,200 mg by mouth daily. D3 Patient not taking: Reported on 07/08/2021    [provider]  Cyanocobalamin (VITAMIN B-12 PO) Take 2,500 mcg by mouth daily. Gummies-does not know dose Patient not taking: Reported on 07/08/2021    [provider]  Investigational Sotorasib, AMGEN 510, 120 MG tablet SWOG S1900E Take 8 tablets (960 mg total) by mouth daily. Grapefruit, grapefruit juice, or [place] oranges may interact with sotorasib. If possible, avoid use with proton pump inhibitors (PPIs; i.e., omeprazole, esomeprazole). Patient not taking: Reported on 07/08/2021  06/21/21   Ladell Pier, MD  loratadine (CLARITIN) 10 MG tablet Take 10 mg by mouth as needed for allergies. Patient not taking: Reported on 07/08/2021    [provider]  LORazepam (ATIVAN) 0.5 MG tablet Take 1 tablet (0.5 mg total) by mouth every 8 (eight) hours as needed for anxiety (or nausea). 05/30/21   Ladell Pier, MD  Multiple Vitamins-Minerals (MULTIVITAMIN WITH MINERALS) tablet Take 1 tablet by mouth daily. Patient not taking: Reported on 07/08/2021    [provider]  Opium 10 MG/ML (1%) TINC Take 0.6 mLs (6 mg total) by mouth every 6 hours as needed for diarrhea or loose stools. 07/08/21   Ladell Pier, MD  POTASSIUM CHLORIDE PO Take 550 mg by mouth daily. Patient not taking: Reported on 07/08/2021    [provider]      Allergies    No known allergies    Review of Systems   Review of Systems See above  Physical Exam Updated Vital Signs BP 103/67    Pulse 82    Temp (!)  97.5 F (36.4 C)    Resp 17    Ht _0  (1.676 m)    Wt 69.9 kg    LMP 04/07/2008    SpO2 93%    BMI 24.86 kg/m  Physical Exam Vitals and nursing note reviewed.  Constitutional:      General: She is not in acute distress.    Appearance: She is well-developed. She is not diaphoretic.  HENT:     Head: Normocephalic and atraumatic.  Eyes:     Conjunctiva/sclera: Conjunctivae normal.  Cardiovascular:     Rate and Rhythm: Normal rate and regular rhythm.  Pulmonary:     Effort: Pulmonary effort is normal. No respiratory distress.  Abdominal:     General: There is no distension.     Palpations: Abdomen is soft.     Tenderness: There is abdominal tenderness (left sided). There is no guarding.  Genitourinary:    Comments: No gross blood or melena on rectal exam Musculoskeletal:        General: No tenderness.     Cervical back: Normal range of motion.  Skin:    General: Skin is warm and dry.     Findings: No erythema or rash.  Neurological:     Mental Status: She is  alert and oriented to person, place, and time.    ED Results / Procedures / Treatments   Labs (all labs ordered are listed, but only abnormal results are displayed) Labs Reviewed  COMPREHENSIVE METABOLIC PANEL - Abnormal; Notable for the following components:      Result Value   Glucose, Bld 100 (*)    All other components within normal limits  CBC - Abnormal; Notable for the following components:   MCV 106.9 (*)    MCH 35.8 (*)    All other components within normal limits  MAGNESIUM - Abnormal; Notable for the following components:   Magnesium 1.4 (*)    All other components within normal limits  LIPASE, BLOOD    EKG None  Radiology CT ABDOMEN PELVIS W CONTRAST  Result Date: 07/10/2021 CLINICAL DATA:  Left lower quadrant abdominal pain, diarrhea for 3 weeks, bloody stools, history of lung cancer EXAM: CT ABDOMEN AND PELVIS WITH CONTRAST TECHNIQUE: Multidetector CT imaging of the abdomen and pelvis was performed using the standard protocol following bolus administration of intravenous contrast. RADIATION DOSE REDUCTION: This exam was performed according to the departmental dose-optimization program which includes automated exposure control, adjustment of the mA and/or kV according to patient size and/or use of iterative reconstruction technique. CONTRAST:  46m OMNIPAQUE IOHEXOL 300 MG/ML  SOLN COMPARISON:  CT abdomen pelvis, 01/04/2009 CT chest, 07/07/2021 FINDINGS: Lower chest: Unchanged post treatment appearance of the right lung base with a small, loculated right pleural effusion, better imaged by recent dedicated CT of the chest. Hepatobiliary: No solid liver abnormality is seen. No gallstones, gallbladder wall thickening, or biliary dilatation. Pancreas: Unremarkable. No pancreatic ductal dilatation or surrounding inflammatory changes. Spleen: Normal in size without significant abnormality. Adrenals/Urinary Tract: Adrenal glands are unremarkable. Kidneys are normal, without renal  calculi, solid lesion, or hydronephrosis. Bladder is unremarkable. Stomach/Bowel: Stomach is within normal limits. Appendix appears normal. No evidence of bowel wall thickening, distention, or inflammatory changes. The colon is fluid-filled. Vascular/Lymphatic: Aortic atherosclerosis. No enlarged abdominal or pelvic lymph nodes. Reproductive: No mass or other significant abnormality. Other: No abdominal wall hernia or abnormality. No ascites. Musculoskeletal: No acute osseous findings. Focally severe disc space height loss and osteophytosis of L5-S1. IMPRESSION: 1.  No acute CT findings of the abdomen or pelvis to explain left lower quadrant abdominal pain or bloody stools. 2. The colon is fluid-filled, consistent with diarrheal illness. 3. Unchanged post treatment appearance of the right lung base with a small, loculated right pleural effusion, better imaged by recent dedicated CT of the chest. Aortic Atherosclerosis (ICD10-I70.0). Electronically Signed   By: Delanna Ahmadi M.D.   On: 07/10/2021 14:07    Procedures Procedures    Medications Ordered in ED Medications  lactated ringers bolus 1,000 mL (0 mLs Intravenous Stopped 07/10/21 1527)  iohexol (OMNIPAQUE) 300 MG/ML solution 100 mL (80 mLs Intravenous Contrast Given 07/10/21 1351)  magnesium sulfate IVPB 2 g 50 mL (0 g Intravenous Stopped 07/10/21 1631)    ED Course/ Medical Decision Making/ A&P                           Medical Decision Making Amount and/or Complexity of Data Reviewed Labs: ordered. Radiology: ordered.  Risk Prescription drug management.   61yo female with history of anal cancer, breast cancer, currently treated for non-small cell lung cancer recently on sotorasib which was discontinued with possibility it contributed to diarrhea who presents with concern for 3 weeks of diarrhea with abdominal cramping and blood per rectum today.  DDx for abd pain includes colitis, appendicitis, pancreatitis, cholecystitis, pyelonephritis,  nephrolithiasis, diverticulitis.   CT abdomen pelvis without acute findings including no diverticula or diverticulitis.    Labs personally evaluated by me and without clinically significant abnormalities-given IV Mg for Mg decreased. Low clinical suspicion for significant lower GI bleed given description of small clots, normal hemoglobin, no gross blood or melena on rectal exam. Suspect likely related to colitis or possible internal hemorrhoid or other.    Recommend follow up with GI and her PCP.  Patient discharged in stable condition with understanding of reasons to return.   Hemodynamically stable. Given IV fluids for rehydration. Patient discharged in stable condition with understanding of reasons to return.           Final Clinical Impression(s) / ED Diagnoses Final diagnoses:  Diarrhea, unspecified type  Abdominal cramping  Rectal bleeding    Rx / DC Orders ED Discharge Orders     None         Gareth Morgan, MD 07/11/21 2343

## 2021-07-10 NOTE — ED Notes (Signed)
Pt reports this morning she noticed some dark red mucous "globules" in her bowel movement and intermittent lower central abdominal cramping that passes without intervention after approx 5 minutes.  Stools persisting as liquid, pt not eating much because it exaggerates symptoms. ?

## 2021-07-11 ENCOUNTER — Telehealth: Payer: Self-pay

## 2021-07-11 ENCOUNTER — Other Ambulatory Visit: Payer: Self-pay | Admitting: Medical Oncology

## 2021-07-11 ENCOUNTER — Encounter: Payer: Self-pay | Admitting: Medical Oncology

## 2021-07-11 ENCOUNTER — Inpatient Hospital Stay (HOSPITAL_BASED_OUTPATIENT_CLINIC_OR_DEPARTMENT_OTHER): Payer: BC Managed Care – PPO | Admitting: Oncology

## 2021-07-11 ENCOUNTER — Inpatient Hospital Stay: Payer: BC Managed Care – PPO

## 2021-07-11 VITALS — BP 137/63 | HR 100 | Temp 97.7°F | Resp 18 | Ht 66.0 in | Wt 162.0 lb

## 2021-07-11 DIAGNOSIS — Z006 Encounter for examination for normal comparison and control in clinical research program: Secondary | ICD-10-CM

## 2021-07-11 DIAGNOSIS — R197 Diarrhea, unspecified: Secondary | ICD-10-CM | POA: Diagnosis not present

## 2021-07-11 DIAGNOSIS — C3490 Malignant neoplasm of unspecified part of unspecified bronchus or lung: Secondary | ICD-10-CM

## 2021-07-11 DIAGNOSIS — Z85048 Personal history of other malignant neoplasm of rectum, rectosigmoid junction, and anus: Secondary | ICD-10-CM | POA: Diagnosis not present

## 2021-07-11 DIAGNOSIS — C3431 Malignant neoplasm of lower lobe, right bronchus or lung: Secondary | ICD-10-CM | POA: Diagnosis not present

## 2021-07-11 DIAGNOSIS — Z923 Personal history of irradiation: Secondary | ICD-10-CM | POA: Diagnosis not present

## 2021-07-11 DIAGNOSIS — Z9221 Personal history of antineoplastic chemotherapy: Secondary | ICD-10-CM | POA: Diagnosis not present

## 2021-07-11 DIAGNOSIS — Z79899 Other long term (current) drug therapy: Secondary | ICD-10-CM | POA: Diagnosis not present

## 2021-07-11 DIAGNOSIS — Z853 Personal history of malignant neoplasm of breast: Secondary | ICD-10-CM | POA: Diagnosis not present

## 2021-07-11 LAB — CBC WITH DIFFERENTIAL (CANCER CENTER ONLY)
Abs Immature Granulocytes: 0.02 10*3/uL (ref 0.00–0.07)
Basophils Absolute: 0 10*3/uL (ref 0.0–0.1)
Basophils Relative: 1 %
Eosinophils Absolute: 0.1 10*3/uL (ref 0.0–0.5)
Eosinophils Relative: 1 %
HCT: 40.5 % (ref 36.0–46.0)
Hemoglobin: 13.4 g/dL (ref 12.0–15.0)
Immature Granulocytes: 0 %
Lymphocytes Relative: 12 %
Lymphs Abs: 0.7 10*3/uL (ref 0.7–4.0)
MCH: 35.1 pg — ABNORMAL HIGH (ref 26.0–34.0)
MCHC: 33.1 g/dL (ref 30.0–36.0)
MCV: 106 fL — ABNORMAL HIGH (ref 80.0–100.0)
Monocytes Absolute: 0.6 10*3/uL (ref 0.1–1.0)
Monocytes Relative: 11 %
Neutro Abs: 4.3 10*3/uL (ref 1.7–7.7)
Neutrophils Relative %: 75 %
Platelet Count: 235 10*3/uL (ref 150–400)
RBC: 3.82 MIL/uL — ABNORMAL LOW (ref 3.87–5.11)
RDW: 12.3 % (ref 11.5–15.5)
WBC Count: 5.7 10*3/uL (ref 4.0–10.5)
nRBC: 0 % (ref 0.0–0.2)

## 2021-07-11 LAB — CMP (CANCER CENTER ONLY)
ALT: 14 U/L (ref 0–44)
AST: 26 U/L (ref 15–41)
Albumin: 4 g/dL (ref 3.5–5.0)
Alkaline Phosphatase: 48 U/L (ref 38–126)
Anion gap: 9 (ref 5–15)
BUN: 11 mg/dL (ref 6–20)
CO2: 27 mmol/L (ref 22–32)
Calcium: 9.2 mg/dL (ref 8.9–10.3)
Chloride: 98 mmol/L (ref 98–111)
Creatinine: 0.87 mg/dL (ref 0.44–1.00)
GFR, Estimated: >60 mL/min (ref >60)
Glucose, Bld: 114 mg/dL — ABNORMAL HIGH (ref 70–99)
Potassium: 3.8 mmol/L (ref 3.5–5.1)
Sodium: 134 mmol/L — ABNORMAL LOW (ref 135–145)
Total Bilirubin: 0.5 mg/dL (ref 0.3–1.2)
Total Protein: 7.4 g/dL (ref 6.5–8.1)

## 2021-07-11 LAB — MAGNESIUM: Magnesium: 1.4 mg/dL — ABNORMAL LOW (ref 1.7–2.4)

## 2021-07-11 NOTE — Progress Notes (Signed)
Gina Cervantes OFFICE PROGRESS NOTE   Diagnosis: Non-small cell lung cancer  INTERVAL HISTORY:   Gina Cervantes returns as scheduled.  She began tincture of opium on the p.m. of 07/08/2021.  She continued to have diarrhea over the weekend.  She developed cramping low abdominal pain and had 3 episodes of passing blood clots per rectum on 07/10/2021.  She was seen in the emergency room.  She received intravenous fluids and magnesium. A CT of the abdomen pelvis revealed fluid in the colon.  No acute finding. She reports improvement in the abdominal discomfort.  She had 1 episode of diarrhea today.  Objective:  Vital signs in last 24 hours:  Blood pressure 137/63, pulse 100, temperature 97.7 F (36.5 C), temperature source Oral, resp. rate 18, height _0  (1.676 m), weight 162 lb (73.5 kg), last menstrual period 04/07/2008, SpO2 95 %.    HEENT: No thrush or ulcers, the mucous membranes are moist Resp: Lungs clear bilaterally Cardio: Regular rate and rhythm GI: Soft, nontender, no hepatosplenomegaly, no mass Vascular: No leg edema  Skin: No rash  Lab Results:  Lab Results  Component Value Date   WBC 5.7 07/11/2021   HGB 13.4 07/11/2021   HCT 40.5 07/11/2021   MCV 106.0 (H) 07/11/2021   PLT 235 07/11/2021   NEUTROABS 4.3 07/11/2021    CMP  Lab Results  Component Value Date   NA 134 (L) 07/11/2021   K 3.8 07/11/2021   CL 98 07/11/2021   CO2 27 07/11/2021   GLUCOSE 114 (H) 07/11/2021   BUN 11 07/11/2021   CREATININE 0.87 07/11/2021   CALCIUM 9.2 07/11/2021   PROT 7.4 07/11/2021   ALBUMIN 4.0 07/11/2021   AST 26 07/11/2021   ALT 14 07/11/2021   ALKPHOS 48 07/11/2021   BILITOT 0.5 07/11/2021   GFRNONAA >60 07/11/2021   GFRAA >60 01/29/2020    Lab Results  Component Value Date   CEA 5.5 (H) 08/02/2012    Imaging:  CT ABDOMEN PELVIS W CONTRAST  Result Date: 07/10/2021 CLINICAL DATA:  Left lower quadrant abdominal pain, diarrhea for 3 weeks, bloody stools,  history of lung cancer EXAM: CT ABDOMEN AND PELVIS WITH CONTRAST TECHNIQUE: Multidetector CT imaging of the abdomen and pelvis was performed using the standard protocol following bolus administration of intravenous contrast. RADIATION DOSE REDUCTION: This exam was performed according to the departmental dose-optimization program which includes automated exposure control, adjustment of the mA and/or kV according to patient size and/or use of iterative reconstruction technique. CONTRAST:  11m OMNIPAQUE IOHEXOL 300 MG/ML  SOLN COMPARISON:  CT abdomen pelvis, 01/04/2009 CT chest, 07/07/2021 FINDINGS: Lower chest: Unchanged post treatment appearance of the right lung base with a small, loculated right pleural effusion, better imaged by recent dedicated CT of the chest. Hepatobiliary: No solid liver abnormality is seen. No gallstones, gallbladder wall thickening, or biliary dilatation. Pancreas: Unremarkable. No pancreatic ductal dilatation or surrounding inflammatory changes. Spleen: Normal in size without significant abnormality. Adrenals/Urinary Tract: Adrenal glands are unremarkable. Kidneys are normal, without renal calculi, solid lesion, or hydronephrosis. Bladder is unremarkable. Stomach/Bowel: Stomach is within normal limits. Appendix appears normal. No evidence of bowel wall thickening, distention, or inflammatory changes. The colon is fluid-filled. Vascular/Lymphatic: Aortic atherosclerosis. No enlarged abdominal or pelvic lymph nodes. Reproductive: No mass or other significant abnormality. Other: No abdominal wall hernia or abnormality. No ascites. Musculoskeletal: No acute osseous findings. Focally severe disc space height loss and osteophytosis of L5-S1. IMPRESSION: 1. No acute CT findings of  the abdomen or pelvis to explain left lower quadrant abdominal pain or bloody stools. 2. The colon is fluid-filled, consistent with diarrheal illness. 3. Unchanged post treatment appearance of the right lung base with a  small, loculated right pleural effusion, better imaged by recent dedicated CT of the chest. Aortic Atherosclerosis (ICD10-I70.0). Electronically Signed   By: Delanna Ahmadi M.D.   On: 07/10/2021 14:07    Medications: I have reviewed the patient's current medications.   Assessment/Plan: Anal cancer-left anal margin/anal canal mass, status post a biopsy on 08/09/2012 confirming invasive moderately differentiated squamous cell carcinoma,p16 positive.   -Staging PET scan 08/20/2012-hypermetabolic anal mass, no hypermetabolic metastases   -Initiation of concurrent radiation with cycle 1 of mitomycin C./5-fluorouracil 09/02/2012.   -She completed cycle 2 5-fluorouracil/mitomycin C. beginning 10/01/2012.   -She completed radiation 10/10/2012.   2. Right breast cancer 2009, ER positive, PR positive, HER-2 positive. She is status post neoadjuvant AC x4 cycles followed by Taxol/Herceptin. She underwent a right lumpectomy and sentinel lymph node biopsy 09/22/2008 with no evidence of residual invasive carcinoma and 5 negative sentinel lymph nodes. She then completed right breast radiation. She began tamoxifen 12/15/2008 and completed adjuvant Herceptin 05/25/2009. The tamoxifen was discontinued and she was switched to Arimidex in July 2013.  Arimidex discontinued in mid July 2019. 3. History of enlargement of the right tonsil.   4. Nonspecific pulmonary nodules without hypermetabolic activity on the PET scan 08/20/2012. Chest CT 10/21/2012 with scattered pulmonary nodules including nodules that were not present in 2010. Annual surveillance was recommended. Follow-up chest CT 10/24/2013 with scattered groundglass densities again noted in both lungs mostly unchanged when compared to the previous study. A sub-solid right lower lobe nodule had increased central density. The size was unchanged.The nodule has been present since 2009. CT chest 06/13/2016-a sub-solid 9 mm right upper lobe nodule has enlarged compared to  2015, a right lower lobe irregular nodular density appears more well defined, new vague 5 mm groundglass left upper lobe nodule, left lower lobe 10 mm groundglass nodule is more apparent, posterior left upper lobe circumscribed solid nodule measures 11 x 10 mm and is new PET scan 06/22/2016-malignant range activity involving the 11 mm left upper lobe nodule, no other hypermetabolic nodules, suspicious sub-solid right lower lobe nodule Wedge resection of a hypermetabolic left upper lobe nodule on 07/05/2016-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 No EGFR, ALK, BRAF, RET, ERBB2, or ROS1 alteration CT chest 11/14/2016-status post left upper lobe wedge resection, stable right lung nodules Chest CT 05/02/2017-stable bilateral solid and groundglass nodules, stable mild mediastinal lymphadenopathy CT chest 12/13/2017- enlargement of right upper lobe nodule, other lung nodules and chest lymph node stable PET 01/09/2018- right upper lobe nodule and right lower lobe nodule have enlarged since 2018 and have low level metabolic activity increased size of an 8 mm right upper lobe nodule without increased metabolic activity, stable activity and a mildly enlarged lower paratracheal node, no evidence of metastatic disease in the abdomen or pelvis CT chest 04/22/2018- no new lung nodules, dominant right lung nodules slightly increased in size Bronchoscopy/EBUS 05/04/2019 with biopsy of level 7 node, biopsy of right lower lobe nodule, FNA of right lower lobe nodule-negative CT biopsy of right upper lobe nodule 06/07/2018- adenocarcinoma with lepidic and acinar patterns SBRT to the dominant right upper lobe nodule 07/23/2018-07/30/2018 CT 09/16/2018-slight increase in size of medial right upper lobe nodule-possibly secondary to  interval radiation, other nodules are unchanged CT 12/25/2018- medial right  upper lobe nodule has decreased in size slightly, multiple additional solid and groundglass nodules are stable CT 06/09/2019-enlarging right lower lobe nodule, enlarging subsolid nodule, new right upper lobe nodule SBRT to right lower lobe lung nodule, 3 fractions 07/08/2019-07/14/2019 CT 12/05/2019-dominant right lower lobe lesion stable to slightly decreased in size, progressive solid component associated with a dominant right upper lung nodule, mild progression of a left upper lobe nodule, new subsolid anterior medial left upper lobe nodule Cycle 1 Alimta/carboplatin/pembrolizumab 02/04/2020 Cycle 2 Alimta/carboplatin/pembrolizumab 02/23/2020 Cycle 3 Alimta/carboplatin/pembrolizumab 03/15/2020 Cycle 4 Alimta/carboplatin/pembrolizumab 04/05/2020 (Alimta and carboplatin dose reduced secondary to anemia) CT chest 04/12/2020-decreased size of solid right lower lobe and right upper lobe nodules, groundglass nodules in the left lung unchanged Cycle 5 Alimta/pembrolizumab 05/03/2020 Cycle 6 Alimta/pembrolizumab 05/21/2020 Cycle 7 Alimta/pembrolizumab 06/14/2020 Cycle 8 Alimta/pembrolizumab 07/05/2020 CT chest 07/22/2020-stable nodules, no evidence of disease progression, unchanged multifocal groundglass nodules suspicious for multifocal adenocarcinoma. Cycle 9 Alimta/Pembrolizumab 07/26/2020 Cycle 10 Alimta/Pembrolizumab 08/16/2020 Cycle 11 Alimta/Pembrolizumab 09/06/2020 Cycle 12 Alimta/pembrolizumab 09/27/2020 Cycle 13 Alimta/pembrolizumab 10/18/2020 CT chest 11/12/2020-enlarging soft tissue density right upper lobe medially adjacent to the mediastinum measuring 2.7 x 2.1 cm, concerning for recurrent tumor.  Stable radiation changes involving the right upper lobe and right lower lobe.  Stable scattered small solid and subsolid pulmonary nodules bilaterally.  New right-sided pleural effusion.  Stable borderline enlarged mediastinal lymph nodes. Bronchoscopy 12/28/2020-right upper lobe and left lower lobe lesions  biopsy-negative pathology PET 6/75/9163-WGY metabolic activity involving the right suprahilar consolidative process-favored benign, measured smaller, additional groundglass densities with very low-level metabolic activity, radiation change in the right lower lobe, resolution of right pleural effusion, no distant metastatic disease Cycle 14 Alimta/pembrolizumab 01/19/2021 Cycle 15 Alimta/pembrolizumab 02/07/2021 Cycle 16 of Alimta/pembrolizumab 02/28/2021 Cycle 17 Alimta/pembrolizumab 03/28/2021 CT chest 04/18/2021-increased areas of masslike density in the right upper lobe and right lower lobe with a new right lower lobe satellite nodule, other groundglass attenuation lesions are stable, slight decrease in size of chronic right pleural effusion CT head 05/06/2021-no evidence of metastatic disease CT chest 05/20/2018-right upper lobe and right lower lobe masses are unchanged, groundglass nodules in the left lung are unchanged, no mediastinal adenopathy, resist measurements of target lesions given 05/30/2021 SWOG S1900E-sotorasib Sotorasib placed on hold 06/30/2021 5.  Baker's cyst left leg, pain and swelling in the left calf, negative Doppler 01/24/2020 and 01/29/2020-evaluated by orthopedics and underwent aspiration of the cyst 01/29/2000, completed a course of Keflex 6.  Anemia secondary to chemotherapy, status post 2 units of packed red blood cells on 04/10/2020-improved 7.  Port-A-Cath placement 01/12/2021; Port-A-Cath removal 02/24/2021 due to infection, culture positive for MRSA, status post 2 courses of doxycycline, Bactrim DS started 03/28/2021, Bactrim DS resumed 04/26/2021 8.  Diarrhea beginning 06/21/2021-sotorasib discontinued 07/01/2021      Disposition:  Gina Cervantes has non-small cell lung cancer.  She developed diarrhea while on sotorasib.  The sotorasib was placed on hold 06/30/2021.  Diarrhea has continued.  She was seen in the emergency room over the weekend with abdominal pain, blood per rectum,  and continued diarrhea.  The pain has resolved.  Diarrhea has improved today.  I recommend she continue tincture of opium as needed.  She reports drowsiness with the tincture of opium.  She will decrease the dose to 3 mg every 6 hours as needed.  I encouraged her to increase fluid and calorie intake.  She will contact us with an update on her condition tomorrow.  The  magnesium remains low.  She declined IV magnesium supplementation today.  We will recommend she start an oral magnesium supplement.  She will return for an office visit in 1 week.  We discussed potential future treatment options for the lung cancer including treatment with a different K-ras inhibitor and standard chemotherapy agents.  She will remain off of sotorasib.  Her ECOG performance status is measured at 1 today.  Betsy Coder, MD  07/11/2021  12:25 PM

## 2021-07-11 NOTE — Telephone Encounter (Signed)
TC to Pt following up after ER visit over the weekend. Pt stated she was having lower abdominal cramping and went to the bathroom and passed a 2 silver dollar sized clots. She continued to have abdominal pain and asked her husband to take her to the ER where she had another bowel movement and began to dry heave. Pt stated she didn't have anything to eat and the only thing that stayed down was a baked potato. Pt scheduled for office visit today confirmed she is coming in today. ?

## 2021-07-11 NOTE — Progress Notes (Signed)
S1900E, A PHASE II STUDY OF SOTORASIB (AMG 510) IN PARTICIPANTS WITH PREVIOUSLY TREATED STAGE IV OR RECURRENT KRAS G12C MUTATED NON-SQUAMOUS NON-SMALL CELL LUNG CANCER (ECOG-ACRIN LUNG-MAP SUB-STUDY) ? ? ?Clinic visit: Cycle 3 (on hold) ?Patient in clinic for cycle 3 on study. Patient presented to clinic, with spouse, for labs and MD appointment. Patient was in clinic for worsening diarrhea on march 3rd, received IV fluids and IV magnesium 57m. Patient in ER yesterday, received IV fluids and IV magnesium 2 mg. Patient had stopped Sotorasib, last intake of 960 mg on February 23rd, drug was placed on hold then due to grade 3 diarrhea. Patient expressed wanting to stop study drug permanently and this was discussed with Dr. SBenay Spice Patient and MD were informed that per the study, treatment delay for treatment related toxicities for more than 28 days, is a criteria for removal from protocol treatment. Patient stopped study drug on February 23rd, which has been 11 days of delayed treatment as of today. Patient informed also of dose reductions, which may help with the side effects she has been experiencing, once she recovers and wishes to continue with study, if within the window of the 28 day allowed hold of treatment. Patient expressed wanting to wait, other treatments were discussed with Dr. SBenay Spice  ? ? ?Labs: study required labs collected this morning. Research labs not collected due to patient not starting cycle 3 today and study drug on hold since the 24th of February. Patient reports to have been fasting this morning.  ?H&P: MD assessed patient and reviewed labs. Per MD, patient ECOG =  1. MD reviewed with patient CT scan completed on 3/2. ?Adverse events: Patient  reports to have been in the emergency room yesterday for severe abdominal cramping, with diarrhea and in which she passed three "red globs" at home and one while in the ER. Patient reports to have not been eating much and a general feeling of un  wellness. Patient with some nausea yesterday and what she describes as "wretching" while in the ER, with no flood or liquid vomited up. Patient's diarrhea did not resolve with hold of Sotorasib or concomitant medications prescribed. Patient states she had one episode of diarrhea this morning, before MD appointment. Patient had a C Diff screen (3/3) which had a negative result. ?Concomitant Medications: new medications prescribed since last cycle visit; imodium (OTC), lomotil (2.5-0.025 mg), and Opium tincture (6 mg). Per MD, patient was instructed to decrease opium tincture to 3 mg, due to drowsiness.  ?Study drug: Patient returned the Cycle 2 Sotorasib dispensed to patient, from [facility] after MD visit. Patient forgot to bring the one empty bottle, but did return the second bottle containing 40 pills, lot# [id number] Rx 2725-0. Count verified by PharmD CNadean Corwin Study drug to be returned to [facility] for documentation of return. No study drug dispensed today.  ?Medication Diaries: Patient returned her cycle 2 medication diary and documents last dose taken on February 23rd. New medication diary was not provided, due to study drug on hold.  ?Plan: Patient wishes to wait before starting treatment, should she decided to continue with study. I expressed my understanding and encouraged her to call Dr. SBenay Spiceor myself with questions, in the meantime. Patient scheduled to return Tuesday the 14th for labs and MD reassessment. All patient and spouse's questions answered to their satisfaction. Patient was thanked for her time and encouraged to call Dr. SBenay Spiceor myself with any questions or concerns she may have.  ? ? ?KSusann Givens  Marcantonio ?[phone] ? ?07/11/2021 ? ?Adverse Event Log   CTCAE v. 5.0 ? ?Study/Protocol: S1900E ?Baseline AE's: chloride, decreased (NCS); carbon dioxide, elevated (NCS); hyperglycemia Gr 1; Bun, elevated (NCS); hypertension Gr 1;  ? ?Baseline to start of cycle 3 ? ?Event Grade Onset Date Resolved Date  Attribution to Study Treatment Sotorasib Treatment Comments  ?diarrhea Gr 1 06/21/21 06/24/21 Probable imodium   ?diarrhea Gr 2 06/24/21 06/26/21 Probable imodium   ?diarrhea Gr 1 06/26/21 06/27/21 Probable imodium   ?diarrhea Gr 3  07/01/21 07/11/21 Probable Lomotil (2/24) ?Opium tincture (07/08/21   ?diarrhea Gr 1 07/11/21 ongoing Probable    ?hypomagnesemia Gr 1 07/08/21 ongoing Probable IV mag 4 GM (3/3) ?IV mag 2 GM (3/5) ?Pt. Declined IV mag. (3/6) 1.2 mg/dL (3/3) ? ?1.4 mg/dL (3/5) ? ?1.4 mg/dL (3/6)  ?Muscle (Abdominal) cramping Gr 2 07/10/21 07/10/21 Probable Went to ER   ?Nausea:   ?Gr 2 07/10/21 07/10/21 Probable fluids   ?dehydration Gr 2 07/08/21 07/10/21 Probable IV fluids 3/3 and 3/5  ?Creatinine elevated Gr 1 07/08/21 07/10/21 Probable IV fluids   ?hyponatremia Gr 1 07/11/21 ongoing Probable none   ? ? ? ?Maxwell Marion, RN, BSN, [place] ?Clinical Research Nurse Lead ?07/11/2021 2:38 PM ? ? ?

## 2021-07-12 ENCOUNTER — Encounter: Payer: Self-pay | Admitting: Oncology

## 2021-07-14 ENCOUNTER — Other Ambulatory Visit: Payer: Self-pay

## 2021-07-14 ENCOUNTER — Other Ambulatory Visit: Payer: Self-pay | Admitting: Oncology

## 2021-07-14 ENCOUNTER — Inpatient Hospital Stay: Payer: BC Managed Care – PPO

## 2021-07-14 ENCOUNTER — Telehealth: Payer: Self-pay

## 2021-07-14 DIAGNOSIS — C3431 Malignant neoplasm of lower lobe, right bronchus or lung: Secondary | ICD-10-CM | POA: Diagnosis not present

## 2021-07-14 DIAGNOSIS — Z85048 Personal history of other malignant neoplasm of rectum, rectosigmoid junction, and anus: Secondary | ICD-10-CM | POA: Diagnosis not present

## 2021-07-14 DIAGNOSIS — R197 Diarrhea, unspecified: Secondary | ICD-10-CM

## 2021-07-14 DIAGNOSIS — C3490 Malignant neoplasm of unspecified part of unspecified bronchus or lung: Secondary | ICD-10-CM

## 2021-07-14 DIAGNOSIS — Z923 Personal history of irradiation: Secondary | ICD-10-CM | POA: Diagnosis not present

## 2021-07-14 DIAGNOSIS — Z9221 Personal history of antineoplastic chemotherapy: Secondary | ICD-10-CM | POA: Diagnosis not present

## 2021-07-14 DIAGNOSIS — Z006 Encounter for examination for normal comparison and control in clinical research program: Secondary | ICD-10-CM | POA: Diagnosis not present

## 2021-07-14 DIAGNOSIS — Z79899 Other long term (current) drug therapy: Secondary | ICD-10-CM | POA: Diagnosis not present

## 2021-07-14 DIAGNOSIS — Z853 Personal history of malignant neoplasm of breast: Secondary | ICD-10-CM | POA: Diagnosis not present

## 2021-07-14 LAB — C DIFFICILE QUICK SCREEN W PCR REFLEX
C Diff antigen: NEGATIVE
C Diff interpretation: NOT DETECTED
C Diff toxin: NEGATIVE

## 2021-07-14 MED ORDER — DIPHENOXYLATE-ATROPINE 2.5-0.025 MG PO TABS: 2.0000 | ORAL_TABLET | Freq: Four times a day (QID) | ORAL | 0 refills | Status: DC | PRN

## 2021-07-14 NOTE — Telephone Encounter (Signed)
TC from Pt stating she is still having diarrhea. Pt stated Dr Benay Spice informed her to give a return call today, to give an update on her status. Pt states she has had 10 bowel movements today already and when she eats it runs right through her. Pt stated she had a bake potato and it seemed to be ok, but the next day she was running to the bathroom again. Discussed with Dr Gwendel Hanson stated to get another stool sample and will test it for C diff and stool panel. Pt stated she will bring in stool sample and requested a refill of lomotil. She stated she stopped the tincture of opium because it did not help. Pt confirmed she is drinking Gatorade and also informed her to take 400 mg of magnesium. Pt verbalized understanding.  ?

## 2021-07-15 ENCOUNTER — Other Ambulatory Visit: Payer: Self-pay

## 2021-07-15 ENCOUNTER — Telehealth: Payer: Self-pay

## 2021-07-15 DIAGNOSIS — R197 Diarrhea, unspecified: Secondary | ICD-10-CM

## 2021-07-15 NOTE — Telephone Encounter (Signed)
-----   Message from Ladell Pier, MD sent at 07/14/2021  9:28 PM EST ----- ?Please call patient, C. difficile is again negative, we will follow-up on the GI pathogen panel, please call to check on her 07/15/2021 ? ?

## 2021-07-15 NOTE — Telephone Encounter (Signed)
Pt stated she saw that results were negative. Continuing imodium and lomotil rotation. Awaiting Stool panel results.  ?

## 2021-07-16 LAB — GASTROINTESTINAL PANEL BY PCR, STOOL (REPLACES STOOL CULTURE)

## 2021-07-18 ENCOUNTER — Telehealth: Payer: Self-pay | Admitting: Medical Oncology

## 2021-07-18 ENCOUNTER — Telehealth: Payer: Self-pay

## 2021-07-18 NOTE — Telephone Encounter (Signed)
S1900E, A PHASE II STUDY OF SOTORASIB (AMG 510) IN PARTICIPANTS WITH PREVIOUSLY TREATED STAGE IV OR RECURRENT KRAS G12C MUTATED NON-SQUAMOUS NON-SMALL CELL LUNG CANCER (ECOG-ACRIN LUNG-MAP SUB-STUDY) ? ?Outgoing call: 12:50 ?Spoke with patient regarding how she is feeling and if diarrhea has resolved. Patient informed me that diarrhea has improved since last week in frequency and amount, but still continues, Patient states that she continues to take lomotil 2 tablets every six hours, and 2 imodium tablets, if she still has diarrhea after that dose, she will take an additional tablet. Patient reports that as of this morning, since 0500, she's already had 5 episodes of diarrhea. Patient does state that she "feels a little bit better," confirms to be staying hydrated and watching what she eats. Patient denies other issues at this time. ? ?I revisited with patient, regarding her wishes, whether she wants to stay on study or discontinue participation completely. Patient confirms that she does not want to continue with study drug. Patient also states that she is willing to allow study to follow her regarding any procedures or events, but patient states that she does not wish to be in the study if she is required to follow the study follow-up schedule, which has labs, clinic visits and scans.  ?I informed patient that I will review with study when we need to schedule her for a safety follow-up and I will call her with this information. Patient gave verbal understanding and denied further questions. Patient was thanked for her time and contribution to the study and encouraged to call Dr. Benay Spice or myself with questions/concerns.  ? ?Maxwell Marion, RN, BSN, [place] ?Clinical Research Nurse Lead ?07/18/2021 1:27 PM ? ?

## 2021-07-18 NOTE — Telephone Encounter (Signed)
S1900E, A PHASE II STUDY OF SOTORASIB (AMG 510) IN PARTICIPANTS WITH PREVIOUSLY TREATED STAGE IV OR RECURRENT KRAS G12C MUTATED NON-SQUAMOUS NON-SMALL CELL LUNG CANCER (ECOG-ACRIN LUNG-MAP SUB-STUDY) ? ?Outgoing call: 17:15 ?LVMOM with patient regarding return pill count on 06MAR2023. Asked patient to check to see if you had any more pills remaining at home and if she could please return these in the bottles (2), if she still had these. I asked patient to bring with her tomorrow to her clinic appointment and give to Dr. Sherrill's nurse and I will collect from them. Patient thanked.  ? ?Mirjana C. Leonetti, RN, BSN, CCRC ?Clinical Research Nurse Lead ?07/18/2021 5:20 PM ?  ?

## 2021-07-18 NOTE — Telephone Encounter (Signed)
TC to Pt to inquire how she is doing today. Pt stated she is still alternating imodium and lomotil. Pt states she has already gone 5 times this morning. Pt states she is eating smaller meals and has more control with her bowel movements. Informed Dr Benay Spice who stated Pt should continue alternating antidiarrheal medication. Dr Benay Spice also stated he will refer Pt to Gastroenterologist if this continues. ? ?

## 2021-07-19 ENCOUNTER — Encounter: Payer: Self-pay | Admitting: Medical Oncology

## 2021-07-19 ENCOUNTER — Inpatient Hospital Stay: Payer: BC Managed Care – PPO

## 2021-07-19 ENCOUNTER — Other Ambulatory Visit: Payer: Self-pay

## 2021-07-19 ENCOUNTER — Encounter: Payer: Self-pay | Admitting: Nurse Practitioner

## 2021-07-19 ENCOUNTER — Inpatient Hospital Stay (HOSPITAL_BASED_OUTPATIENT_CLINIC_OR_DEPARTMENT_OTHER): Payer: BC Managed Care – PPO | Admitting: Nurse Practitioner

## 2021-07-19 VITALS — BP 115/59 | HR 87 | Temp 98.1°F | Resp 18 | Ht 66.0 in | Wt 161.0 lb

## 2021-07-19 DIAGNOSIS — R197 Diarrhea, unspecified: Secondary | ICD-10-CM

## 2021-07-19 DIAGNOSIS — Z853 Personal history of malignant neoplasm of breast: Secondary | ICD-10-CM | POA: Diagnosis not present

## 2021-07-19 DIAGNOSIS — Z9221 Personal history of antineoplastic chemotherapy: Secondary | ICD-10-CM | POA: Diagnosis not present

## 2021-07-19 DIAGNOSIS — C3431 Malignant neoplasm of lower lobe, right bronchus or lung: Secondary | ICD-10-CM | POA: Diagnosis not present

## 2021-07-19 DIAGNOSIS — C3490 Malignant neoplasm of unspecified part of unspecified bronchus or lung: Secondary | ICD-10-CM | POA: Diagnosis not present

## 2021-07-19 DIAGNOSIS — Z923 Personal history of irradiation: Secondary | ICD-10-CM | POA: Diagnosis not present

## 2021-07-19 DIAGNOSIS — Z79899 Other long term (current) drug therapy: Secondary | ICD-10-CM | POA: Diagnosis not present

## 2021-07-19 DIAGNOSIS — Z006 Encounter for examination for normal comparison and control in clinical research program: Secondary | ICD-10-CM | POA: Diagnosis not present

## 2021-07-19 DIAGNOSIS — Z85048 Personal history of other malignant neoplasm of rectum, rectosigmoid junction, and anus: Secondary | ICD-10-CM | POA: Diagnosis not present

## 2021-07-19 LAB — CMP (CANCER CENTER ONLY)
ALT: 14 U/L (ref 0–44)
AST: 24 U/L (ref 15–41)
Albumin: 3.9 g/dL (ref 3.5–5.0)
Alkaline Phosphatase: 58 U/L (ref 38–126)
Anion gap: 10 (ref 5–15)
BUN: 13 mg/dL (ref 6–20)
CO2: 29 mmol/L (ref 22–32)
Calcium: 9.2 mg/dL (ref 8.9–10.3)
Chloride: 97 mmol/L — ABNORMAL LOW (ref 98–111)
Creatinine: 0.84 mg/dL (ref 0.44–1.00)
GFR, Estimated: >60 mL/min (ref >60)
Glucose, Bld: 96 mg/dL (ref 70–99)
Potassium: 3.7 mmol/L (ref 3.5–5.1)
Sodium: 136 mmol/L (ref 135–145)
Total Bilirubin: 0.6 mg/dL (ref 0.3–1.2)
Total Protein: 7.5 g/dL (ref 6.5–8.1)

## 2021-07-19 LAB — CBC WITH DIFFERENTIAL (CANCER CENTER ONLY)
Abs Immature Granulocytes: 0.02 10*3/uL (ref 0.00–0.07)
Basophils Absolute: 0 10*3/uL (ref 0.0–0.1)
Basophils Relative: 1 %
Eosinophils Absolute: 0.1 10*3/uL (ref 0.0–0.5)
Eosinophils Relative: 1 %
HCT: 40.9 % (ref 36.0–46.0)
Hemoglobin: 13.8 g/dL (ref 12.0–15.0)
Immature Granulocytes: 0 %
Lymphocytes Relative: 12 %
Lymphs Abs: 0.8 10*3/uL (ref 0.7–4.0)
MCH: 35.1 pg — ABNORMAL HIGH (ref 26.0–34.0)
MCHC: 33.7 g/dL (ref 30.0–36.0)
MCV: 104.1 fL — ABNORMAL HIGH (ref 80.0–100.0)
Monocytes Absolute: 0.8 10*3/uL (ref 0.1–1.0)
Monocytes Relative: 13 %
Neutro Abs: 4.9 10*3/uL (ref 1.7–7.7)
Neutrophils Relative %: 73 %
Platelet Count: 244 10*3/uL (ref 150–400)
RBC: 3.93 MIL/uL (ref 3.87–5.11)
RDW: 12 % (ref 11.5–15.5)
WBC Count: 6.6 10*3/uL (ref 4.0–10.5)
nRBC: 0 % (ref 0.0–0.2)

## 2021-07-19 LAB — MAGNESIUM: Magnesium: 1.3 mg/dL — ABNORMAL LOW (ref 1.7–2.4)

## 2021-07-19 MED ORDER — DIPHENOXYLATE-ATROPINE 2.5-0.025 MG PO TABS: 2.0000 | ORAL_TABLET | Freq: Four times a day (QID) | ORAL | 0 refills | Status: DC | PRN

## 2021-07-19 NOTE — Progress Notes (Signed)
?[place] ?OFFICE PROGRESS NOTE ? ? ?Diagnosis: Non-small cell lung cancer ? ?INTERVAL HISTORY:  ? ?Gina Cervantes returns as scheduled.  The diarrhea is better.  So far today she has had 3 loose stools.  Maybe 5 or 6 yesterday.  She is taking 2 Lomotil 4 times a day.  She also takes Imodium.  She noted no improvement with tincture of opium.  No nausea for the past few days.  No abdominal pain. ? ?Objective: ? ?Vital signs in last 24 hours: ? ?Blood pressure (!) 115/59, pulse 87, temperature 98.1 ?F (36.7 ?C), temperature source Oral, resp. rate 18, height '5\' 6"'  (1.676 m), weight 161 lb (73 kg), last menstrual period 04/07/2008, SpO2 94 %. ?  ? ?Resp: Lungs clear bilaterally. ?Cardio: Regular rate and rhythm. ?GI: Abdomen soft and nontender.  No hepatosplenomegaly. ?Vascular: No leg edema. ?Skin: No rash. ? ? ?Lab Results: ? ?Lab Results  ?Component Value Date  ? WBC 6.6 07/19/2021  ? HGB 13.8 07/19/2021  ? HCT 40.9 07/19/2021  ? MCV 104.1 (H) 07/19/2021  ? PLT 244 07/19/2021  ? NEUTROABS 4.9 07/19/2021  ? ? ?Imaging: ? ?No results found. ? ?Medications: I have reviewed the patient's current medications. ? ?Assessment/Plan: ?Anal cancer-left anal margin/anal canal mass, status post a biopsy on 08/09/2012 confirming invasive moderately differentiated squamous cell carcinoma,p16 positive.   ?-Staging PET scan 08/20/2012-hypermetabolic anal mass, no hypermetabolic metastases   ?-Initiation of concurrent radiation with cycle 1 of mitomycin C./5-fluorouracil 09/02/2012.   ?-She completed cycle 2 5-fluorouracil/mitomycin C. beginning 10/01/2012.   ?-She completed radiation 10/10/2012.   ?2. Right breast cancer 2009, ER positive, PR positive, HER-2 positive. She is status post neoadjuvant AC x4 cycles followed by Taxol/Herceptin. She underwent a right lumpectomy and sentinel lymph node biopsy 09/22/2008 with no evidence of residual invasive carcinoma and 5 negative sentinel lymph nodes. She then completed  right breast radiation. She began tamoxifen 12/15/2008 and completed adjuvant Herceptin 05/25/2009. The tamoxifen was discontinued and she was switched to Arimidex in July 2013.  Arimidex discontinued in mid July 2019. ?3. History of enlargement of the right tonsil.   ?4. Nonspecific pulmonary nodules without hypermetabolic activity on the PET scan 08/20/2012. Chest CT 10/21/2012 with scattered pulmonary nodules including nodules that were not present in 2010. Annual surveillance was recommended. Follow-up chest CT 10/24/2013 with scattered groundglass densities again noted in both lungs mostly unchanged when compared to the previous study. A sub-solid right lower lobe nodule had increased central density. The size was unchanged.The nodule has been present since 2009. ?CT chest 06/13/2016-a sub-solid 9 mm right upper lobe nodule has enlarged compared to 2015, a right lower lobe irregular nodular density appears more well defined, new vague 5 mm groundglass left upper lobe nodule, left lower lobe 10 mm groundglass nodule is more apparent, posterior left upper lobe circumscribed solid nodule measures 11 x 10 mm and is new ?PET scan 06/22/2016-malignant range activity involving the 11 mm left upper lobe nodule, no other hypermetabolic nodules, suspicious sub-solid right lower lobe nodule ?Wedge resection of a hypermetabolic left upper lobe nodule on 07/05/2016-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 No EGFR, ALK, BRAF, RET, ERBB2, or ROS1 alteration ?CT chest 11/14/2016-status post left upper lobe wedge resection, stable right lung nodules ?Chest CT 05/02/2017-stable bilateral solid and groundglass nodules, stable mild mediastinal lymphadenopathy ?CT chest 12/13/2017- enlargement of right upper lobe nodule, other  lung nodules and chest lymph node stable ?PET 01/09/2018- right upper lobe nodule  and right lower lobe nodule have enlarged since 2018 and have low level metabolic activity increased size of an 8 mm right upper lobe nodule without increased metabolic activity, stable activity and a mildly enlarged lower paratracheal node, no evidence of metastatic disease in the abdomen or pelvis ?CT chest 04/22/2018- no new lung nodules, dominant right lung nodules slightly increased in size ?Bronchoscopy/EBUS 05/04/2019 with biopsy of level 7 node, biopsy of right lower lobe nodule, FNA of right lower lobe nodule-negative ?CT biopsy of right upper lobe nodule 06/07/2018- adenocarcinoma with lepidic and acinar patterns ?SBRT to the dominant right upper lobe nodule 07/23/2018-07/30/2018 ?CT 09/16/2018-slight increase in size of medial right upper lobe nodule-possibly secondary to interval radiation, other nodules are unchanged ?CT 12/25/2018- medial right upper lobe nodule has decreased in size slightly, multiple additional solid and groundglass nodules are stable ?CT 06/09/2019-enlarging right lower lobe nodule, enlarging subsolid nodule, new right upper lobe nodule ?SBRT to right lower lobe lung nodule, 3 fractions 07/08/2019-07/14/2019 ?CT 12/05/2019-dominant right lower lobe lesion stable to slightly decreased in size, progressive solid component associated with a dominant right upper lung nodule, mild progression of a left upper lobe nodule, new subsolid anterior medial left upper lobe nodule ?Cycle 1 Alimta/carboplatin/pembrolizumab 02/04/2020 ?Cycle 2 Alimta/carboplatin/pembrolizumab 02/23/2020 ?Cycle 3 Alimta/carboplatin/pembrolizumab 03/15/2020 ?Cycle 4 Alimta/carboplatin/pembrolizumab 04/05/2020 (Alimta and carboplatin dose reduced secondary to anemia) ?CT chest 04/12/2020-decreased size of solid right lower lobe and right upper lobe nodules, groundglass nodules in the left lung unchanged ?Cycle 5 Alimta/pembrolizumab 05/03/2020 ?Cycle 6 Alimta/pembrolizumab 05/21/2020 ?Cycle 7 Alimta/pembrolizumab 06/14/2020 ?Cycle 8  Alimta/pembrolizumab 07/05/2020 ?CT chest 07/22/2020-stable nodules, no evidence of disease progression, unchanged multifocal groundglass nodules suspicious for multifocal adenocarcinoma. ?Cycle 9 Alimta/Pembrolizumab 07/26/2020 ?Cycle 10 Alimta/Pembrolizumab 08/16/2020 ?Cycle 11 Alimta/Pembrolizumab 09/06/2020 ?Cycle 12 Alimta/pembrolizumab 09/27/2020 ?Cycle 13 Alimta/pembrolizumab 10/18/2020 ?CT chest 11/12/2020-enlarging soft tissue density right upper lobe medially adjacent to the mediastinum measuring 2.7 x 2.1 cm, concerning for recurrent tumor.  Stable radiation changes involving the right upper lobe and right lower lobe.  Stable scattered small solid and subsolid pulmonary nodules bilaterally.  New right-sided pleural effusion.  Stable borderline enlarged mediastinal lymph nodes. ?Bronchoscopy 12/28/2020-right upper lobe and left lower lobe lesions biopsy-negative pathology ?PET 0/07/7046-GQB metabolic activity involving the right suprahilar consolidative process-favored benign, measured smaller, additional groundglass densities with very low-level metabolic activity, radiation change in the right lower lobe, resolution of right pleural effusion, no distant metastatic disease ?Cycle 14 Alimta/pembrolizumab 01/19/2021 ?Cycle 15 Alimta/pembrolizumab 02/07/2021 ?Cycle 16 of Alimta/pembrolizumab 02/28/2021 ?Cycle 17 Alimta/pembrolizumab 03/28/2021 ?CT chest 04/18/2021-increased areas of masslike density in the right upper lobe and right lower lobe with a new right lower lobe satellite nodule, other groundglass attenuation lesions are stable, slight decrease in size of chronic right pleural effusion ?CT head 05/06/2021-no evidence of metastatic disease ?CT chest 05/20/2018-right upper lobe and right lower lobe masses are unchanged, groundglass nodules in the left lung are unchanged, no mediastinal adenopathy, resist measurements of target lesions given ?05/30/2021 SWOG S1900E-sotorasib ?Sotorasib placed on hold 06/30/2021 ?5.   Baker's cyst left leg, pain and swelling in the left calf, negative Doppler 01/24/2020 and 01/29/2020-evaluated by orthopedics and underwent aspiration of the cyst 01/29/2000, completed a course of Keflex ?6.

## 2021-07-19 NOTE — Research (Deleted)
S1900E, A PHASE II STUDY OF SOTORASIB (AMG 510) IN PARTICIPANTS WITH PREVIOUSLY TREATED STAGE IV OR RECURRENT KRAS G12C MUTATED NON-SQUAMOUS NON-SMALL CELL LUNG CANCER (ECOG-ACRIN LUNG-MAP SUB-STUDY) ? ? ?In-clinic visit: Non-study visit ?I met briefly with patient this afternoon, during her scheduled follow-up appointment with NP, Ned Card. Patient was to return today for assessment of diarrhea and labs. Patient provided me with second bottle of Sotorasib, which she previously had forgot to bring with her. IP bottle returned, unopened, with 120 tablets, [id number]. Returned IP was given to PharmD Roselind Messier to be returned to [facility] for verification of return.  ?In total, patient returned 160 tablets of Sotorasib, which was dispensed to her for Cycle 2, in which had stopped early due to Grade 3 diarrhea. Patient reports that she continues to slowly improve. Patient informed that I will contact her regarding an appointment for study follow-up, should it be required by the study, and since she does not wish to continue with study treatment of study schedule of events. I thanked patient for her time and encouraged her to contact Dr. Benay Spice or myself with any questions or concerns.  ? ?Maxwell Marion, RN, BSN, [place] ?Clinical Research Nurse Lead ?07/19/2021 3:38 PM ? ?

## 2021-07-19 NOTE — Progress Notes (Signed)
S1900E, A PHASE II STUDY OF SOTORASIB (AMG 510) IN PARTICIPANTS WITH PREVIOUSLY TREATED STAGE IV OR RECURRENT KRAS G12C MUTATED NON-SQUAMOUS NON-SMALL CELL LUNG CANCER (ECOG-ACRIN LUNG-MAP SUB-STUDY) ? ? ?In-clinic visit: Non-study visit ?I met briefly with patient this afternoon, during her scheduled follow-up appointment with NP, Lisa Thomas. Patient was to return today for assessment of diarrhea and labs. Patient provided me with second bottle of Sotorasib, which she previously had forgot to bring with her. IP bottle returned, unopened, with 120 tablets, [id number]. Returned IP was given to PharmD Susan Kidd to be returned to [facility] for verification of return.  ?In total, patient returned 160 tablets of Sotorasib, which was dispensed to her for Cycle 2, in which had stopped early due to Grade 3 diarrhea. Patient reports that she continues to slowly improve. Patient informed that I will contact her regarding an appointment for study follow-up, should it be required by the study, and since she does not wish to continue with study treatment of study schedule of events. I thanked patient for her time and encouraged her to contact Dr. Sherrill or myself with any questions or concerns.  ? ?Jackey Housey C. Dangela How, RN, BSN, CCRC ?Clinical Research Nurse Lead ?07/19/2021 3:38 PM ? ?

## 2021-08-02 ENCOUNTER — Telehealth: Payer: Self-pay | Admitting: Medical Oncology

## 2021-08-02 NOTE — Telephone Encounter (Signed)
S1900E, A PHASE II STUDY OF SOTORASIB (AMG 510) IN PARTICIPANTS WITH PREVIOUSLY TREATED STAGE IV OR RECURRENT KRAS G12C MUTATED NON-SQUAMOUS NON-SMALL CELL LUNG CANCER (ECOG-ACRIN LUNG-MAP SUB-STUDY) ? ?Follow-up call: 1142 ? ?Call to patient to follow up and see how she was doing. Patient has been off of study drug Sotorasib since she took her last dose on February 23rd. Patient reports to be doing and feeling well, but still not at her bowel movement baseline. Patient reports having her first "normal bowel movement" this morning. Patient states on average has five, non watery stools per day, reports "consistency of oatmeal." Per CTCAE v5.0, diarrhea  at a Grade 2. Patient states she stopped taking imodium this past Saturday. Patient with no other complaints at this time. She has a clinic visit with Dr. Sherrill on April 3rd.  ?Patient denies any questions. I thanked her for her time and encouraged to call Dr. Sherrill or myself with any questions or concerns.  ? ?Mirjana C. Leonetti, RN, BSN, CCRC ?Clinical Research Nurse Lead ?08/02/2021 12:00 PM ? ?

## 2021-08-08 ENCOUNTER — Ambulatory Visit (HOSPITAL_BASED_OUTPATIENT_CLINIC_OR_DEPARTMENT_OTHER)
Admission: RE | Admit: 2021-08-08 | Discharge: 2021-08-08 | Disposition: A | Discharge status: Home / Self Care | Payer: BC Managed Care – PPO | Source: Ambulatory Visit | Attending: Oncology | Admitting: Oncology

## 2021-08-08 ENCOUNTER — Encounter (HOSPITAL_COMMUNITY): Payer: Self-pay

## 2021-08-08 ENCOUNTER — Inpatient Hospital Stay: Payer: BC Managed Care – PPO | Attending: Oncology

## 2021-08-08 ENCOUNTER — Inpatient Hospital Stay (HOSPITAL_BASED_OUTPATIENT_CLINIC_OR_DEPARTMENT_OTHER): Payer: BC Managed Care – PPO | Admitting: Oncology

## 2021-08-08 ENCOUNTER — Ambulatory Visit (HOSPITAL_COMMUNITY)
Admission: RE | Admit: 2021-08-08 | Discharge: 2021-08-08 | Disposition: A | Discharge status: Home / Self Care | Payer: BC Managed Care – PPO | Source: Ambulatory Visit | Attending: Oncology | Admitting: Oncology

## 2021-08-08 VITALS — BP 109/71 | HR 110 | Temp 98.7°F | Resp 18 | Ht 66.0 in | Wt 151.2 lb

## 2021-08-08 DIAGNOSIS — C3490 Malignant neoplasm of unspecified part of unspecified bronchus or lung: Secondary | ICD-10-CM | POA: Insufficient documentation

## 2021-08-08 DIAGNOSIS — Z923 Personal history of irradiation: Secondary | ICD-10-CM | POA: Insufficient documentation

## 2021-08-08 DIAGNOSIS — Z170 Estrogen receptor positive status [ER+]: Secondary | ICD-10-CM | POA: Diagnosis not present

## 2021-08-08 DIAGNOSIS — R197 Diarrhea, unspecified: Secondary | ICD-10-CM

## 2021-08-08 DIAGNOSIS — Z79899 Other long term (current) drug therapy: Secondary | ICD-10-CM | POA: Insufficient documentation

## 2021-08-08 DIAGNOSIS — Z853 Personal history of malignant neoplasm of breast: Secondary | ICD-10-CM | POA: Diagnosis not present

## 2021-08-08 DIAGNOSIS — J90 Pleural effusion, not elsewhere classified: Secondary | ICD-10-CM | POA: Diagnosis not present

## 2021-08-08 DIAGNOSIS — C3431 Malignant neoplasm of lower lobe, right bronchus or lung: Secondary | ICD-10-CM | POA: Insufficient documentation

## 2021-08-08 LAB — CMP (CANCER CENTER ONLY)
ALT: 12 U/L (ref 0–44)
AST: 23 U/L (ref 15–41)
Albumin: 3.6 g/dL (ref 3.5–5.0)
Alkaline Phosphatase: 68 U/L (ref 38–126)
Anion gap: 11 (ref 5–15)
BUN: 16 mg/dL (ref 6–20)
CO2: 31 mmol/L (ref 22–32)
Calcium: 9.5 mg/dL (ref 8.9–10.3)
Chloride: 94 mmol/L — ABNORMAL LOW (ref 98–111)
Creatinine: 0.98 mg/dL (ref 0.44–1.00)
GFR, Estimated: >60 mL/min (ref >60)
Glucose, Bld: 117 mg/dL — ABNORMAL HIGH (ref 70–99)
Potassium: 4 mmol/L (ref 3.5–5.1)
Sodium: 136 mmol/L (ref 135–145)
Total Bilirubin: 0.5 mg/dL (ref 0.3–1.2)
Total Protein: 7.8 g/dL (ref 6.5–8.1)

## 2021-08-08 LAB — CBC WITH DIFFERENTIAL (CANCER CENTER ONLY)
Abs Immature Granulocytes: 0.03 10*3/uL (ref 0.00–0.07)
Basophils Absolute: 0 10*3/uL (ref 0.0–0.1)
Basophils Relative: 1 %
Eosinophils Absolute: 0.1 10*3/uL (ref 0.0–0.5)
Eosinophils Relative: 2 %
HCT: 41.9 % (ref 36.0–46.0)
Hemoglobin: 14 g/dL (ref 12.0–15.0)
Immature Granulocytes: 1 %
Lymphocytes Relative: 16 %
Lymphs Abs: 0.9 10*3/uL (ref 0.7–4.0)
MCH: 34.2 pg — ABNORMAL HIGH (ref 26.0–34.0)
MCHC: 33.4 g/dL (ref 30.0–36.0)
MCV: 102.4 fL — ABNORMAL HIGH (ref 80.0–100.0)
Monocytes Absolute: 0.8 10*3/uL (ref 0.1–1.0)
Monocytes Relative: 13 %
Neutro Abs: 3.9 10*3/uL (ref 1.7–7.7)
Neutrophils Relative %: 67 %
Platelet Count: 224 10*3/uL (ref 150–400)
RBC: 4.09 MIL/uL (ref 3.87–5.11)
RDW: 12.1 % (ref 11.5–15.5)
WBC Count: 5.8 10*3/uL (ref 4.0–10.5)
nRBC: 0 % (ref 0.0–0.2)

## 2021-08-08 LAB — MAGNESIUM: Magnesium: 1.2 mg/dL — ABNORMAL LOW (ref 1.7–2.4)

## 2021-08-08 NOTE — Progress Notes (Signed)
?[place] ?OFFICE PROGRESS NOTE ? ? ?Diagnosis: Non-small cell lung cancer ? ?INTERVAL HISTORY:  ? ?Gina Cervantes returns as scheduled.  She reports no longer taking antidiarrhea medication.  She had a formed stool last week.  She developed a sore throat, cough, and fever beginning 08/04/2021.  The fever has resolved.  She continues to have a cough and increased dyspnea.  She is using inhalers.  She has not taking magnesium.  Diarrhea has returned since beginning Robitussin..  She has 3-4 bowel movements per day.  Her appetite has diminished since she developed the upper respiratory infection. ? ?Objective: ? ?Vital signs in last 24 hours: ? ?Blood pressure 109/71, pulse (!) 110, temperature 98.7 ?F (37.1 ?C), temperature source Oral, resp. rate 18, height $RemoveBe'5\' 6"'pJGNzWSUr$  (1.676 m), weight 151 lb 3.2 oz (68.6 kg), last menstrual period 04/07/2008, SpO2 93 %. ?  ? ?HEENT: Oropharynx without thrush.  Pharynx without erythema ?Resp: Bilateral inspiratory/expiratory rhonchi and wheezes, no respiratory distress ?Cardio: Regular rate and rhythm ?GI: No hepatosplenomegaly ?Vascular: No leg edema, the left lower leg is slightly larger than the right side  ? ? ?Lab Results: ? ?Lab Results  ?Component Value Date  ? WBC 5.8 08/08/2021  ? HGB 14.0 08/08/2021  ? HCT 41.9 08/08/2021  ? MCV 102.4 (H) 08/08/2021  ? PLT 224 08/08/2021  ? NEUTROABS 3.9 08/08/2021  ? ? ?CMP  ?Lab Results  ?Component Value Date  ? NA 136 08/08/2021  ? K 4.0 08/08/2021  ? CL 94 (L) 08/08/2021  ? CO2 31 08/08/2021  ? GLUCOSE 117 (H) 08/08/2021  ? BUN 16 08/08/2021  ? CREATININE 0.98 08/08/2021  ? CALCIUM 9.5 08/08/2021  ? PROT 7.8 08/08/2021  ? ALBUMIN 3.6 08/08/2021  ? AST 23 08/08/2021  ? ALT 12 08/08/2021  ? ALKPHOS 68 08/08/2021  ? BILITOT 0.5 08/08/2021  ? GFRNONAA >60 08/08/2021  ? GFRAA >60 01/29/2020  ? ? ?Lab Results  ?Component Value Date  ? CEA 5.5 (H) 08/02/2012  ? ? ? ?Medications: I have reviewed the patient's current  medications. ? ? ?Assessment/Plan: ?Anal cancer-left anal margin/anal canal mass, status post a biopsy on 08/09/2012 confirming invasive moderately differentiated squamous cell carcinoma,p16 positive.   ?-Staging PET scan 08/20/2012-hypermetabolic anal mass, no hypermetabolic metastases   ?-Initiation of concurrent radiation with cycle 1 of mitomycin C./5-fluorouracil 09/02/2012.   ?-She completed cycle 2 5-fluorouracil/mitomycin C. beginning 10/01/2012.   ?-She completed radiation 10/10/2012.   ?2. Right breast cancer 2009, ER positive, PR positive, HER-2 positive. She is status post neoadjuvant AC x4 cycles followed by Taxol/Herceptin. She underwent a right lumpectomy and sentinel lymph node biopsy 09/22/2008 with no evidence of residual invasive carcinoma and 5 negative sentinel lymph nodes. She then completed right breast radiation. She began tamoxifen 12/15/2008 and completed adjuvant Herceptin 05/25/2009. The tamoxifen was discontinued and she was switched to Arimidex in July 2013.  Arimidex discontinued in mid July 2019. ?3. History of enlargement of the right tonsil.   ?4. Nonspecific pulmonary nodules without hypermetabolic activity on the PET scan 08/20/2012. Chest CT 10/21/2012 with scattered pulmonary nodules including nodules that were not present in 2010. Annual surveillance was recommended. Follow-up chest CT 10/24/2013 with scattered groundglass densities again noted in both lungs mostly unchanged when compared to the previous study. A sub-solid right lower lobe nodule had increased central density. The size was unchanged.The nodule has been present since 2009. ?CT chest 06/13/2016-a sub-solid 9 mm right upper lobe nodule has enlarged compared to 2015, a  right lower lobe irregular nodular density appears more well defined, new vague 5 mm groundglass left upper lobe nodule, left lower lobe 10 mm groundglass nodule is more apparent, posterior left upper lobe circumscribed solid nodule measures 11 x 10  mm and is new ?PET scan 06/22/2016-malignant range activity involving the 11 mm left upper lobe nodule, no other hypermetabolic nodules, suspicious sub-solid right lower lobe nodule ?Wedge resection of a hypermetabolic left upper lobe nodule on 07/05/2016-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 No EGFR, ALK, BRAF, RET, ERBB2, or ROS1 alteration ?CT chest 11/14/2016-status post left upper lobe wedge resection, stable right lung nodules ?Chest CT 05/02/2017-stable bilateral solid and groundglass nodules, stable mild mediastinal lymphadenopathy ?CT chest 12/13/2017- enlargement of right upper lobe nodule, other lung nodules and chest lymph node stable ?PET 01/09/2018- right upper lobe nodule and right lower lobe nodule have enlarged since 2018 and have low level metabolic activity increased size of an 8 mm right upper lobe nodule without increased metabolic activity, stable activity and a mildly enlarged lower paratracheal node, no evidence of metastatic disease in the abdomen or pelvis ?CT chest 04/22/2018- no new lung nodules, dominant right lung nodules slightly increased in size ?Bronchoscopy/EBUS 05/04/2019 with biopsy of level 7 node, biopsy of right lower lobe nodule, FNA of right lower lobe nodule-negative ?CT biopsy of right upper lobe nodule 06/07/2018- adenocarcinoma with lepidic and acinar patterns ?SBRT to the dominant right upper lobe nodule 07/23/2018-07/30/2018 ?CT 09/16/2018-slight increase in size of medial right upper lobe nodule-possibly secondary to interval radiation, other nodules are unchanged ?CT 12/25/2018- medial right upper lobe nodule has decreased in size slightly, multiple additional solid and groundglass nodules are stable ?CT 06/09/2019-enlarging right lower lobe nodule, enlarging subsolid nodule, new right upper lobe nodule ?SBRT to right lower lobe lung nodule, 3  fractions 07/08/2019-07/14/2019 ?CT 12/05/2019-dominant right lower lobe lesion stable to slightly decreased in size, progressive solid component associated with a dominant right upper lung nodule, mild progression of a left upper lobe nodule, new subsolid anterior medial left upper lobe nodule ?Cycle 1 Alimta/carboplatin/pembrolizumab 02/04/2020 ?Cycle 2 Alimta/carboplatin/pembrolizumab 02/23/2020 ?Cycle 3 Alimta/carboplatin/pembrolizumab 03/15/2020 ?Cycle 4 Alimta/carboplatin/pembrolizumab 04/05/2020 (Alimta and carboplatin dose reduced secondary to anemia) ?CT chest 04/12/2020-decreased size of solid right lower lobe and right upper lobe nodules, groundglass nodules in the left lung unchanged ?Cycle 5 Alimta/pembrolizumab 05/03/2020 ?Cycle 6 Alimta/pembrolizumab 05/21/2020 ?Cycle 7 Alimta/pembrolizumab 06/14/2020 ?Cycle 8 Alimta/pembrolizumab 07/05/2020 ?CT chest 07/22/2020-stable nodules, no evidence of disease progression, unchanged multifocal groundglass nodules suspicious for multifocal adenocarcinoma. ?Cycle 9 Alimta/Pembrolizumab 07/26/2020 ?Cycle 10 Alimta/Pembrolizumab 08/16/2020 ?Cycle 11 Alimta/Pembrolizumab 09/06/2020 ?Cycle 12 Alimta/pembrolizumab 09/27/2020 ?Cycle 13 Alimta/pembrolizumab 10/18/2020 ?CT chest 11/12/2020-enlarging soft tissue density right upper lobe medially adjacent to the mediastinum measuring 2.7 x 2.1 cm, concerning for recurrent tumor.  Stable radiation changes involving the right upper lobe and right lower lobe.  Stable scattered small solid and subsolid pulmonary nodules bilaterally.  New right-sided pleural effusion.  Stable borderline enlarged mediastinal lymph nodes. ?Bronchoscopy 12/28/2020-right upper lobe and left lower lobe lesions biopsy-negative pathology ?PET 8/36/6294-TML metabolic activity involving the right suprahilar consolidative process-favored benign, measured smaller, additional groundglass densities with very low-level metabolic activity, radiation change in the right lower lobe,  resolution of right pleural effusion, no distant metastatic disease ?Cycle 14 Alimta/pembrolizumab 01/19/2021 ?Cycle 15 Alimta/pembrolizumab 02/07/2021 ?Cycle 16 of Alimta/pembrolizumab 02/28/2021 ?Cycle 17 Alimta/pembrolizu

## 2021-08-09 ENCOUNTER — Telehealth: Payer: Self-pay | Admitting: Oncology

## 2021-08-09 NOTE — Telephone Encounter (Signed)
Attempted to contact patient  to schedule follow up visit per [facility] 4/3. No answer so voicemail was left for patient to call back  ? ?

## 2021-08-10 ENCOUNTER — Encounter: Payer: Self-pay | Admitting: Pulmonary Disease

## 2021-08-10 MED ORDER — BREZTRI AEROSPHERE 160-9-4.8 MCG/ACT IN AERO: 2.0000 | INHALATION_SPRAY | Freq: Two times a day (BID) | RESPIRATORY_TRACT | 1 refills | Status: DC

## 2021-08-17 ENCOUNTER — Encounter: Payer: Self-pay | Admitting: Nurse Practitioner

## 2021-08-17 ENCOUNTER — Ambulatory Visit (INDEPENDENT_AMBULATORY_CARE_PROVIDER_SITE_OTHER): Payer: BC Managed Care – PPO | Admitting: Nurse Practitioner

## 2021-08-17 VITALS — BP 110/60 | HR 95 | Ht 66.0 in | Wt 151.8 lb

## 2021-08-17 DIAGNOSIS — C3490 Malignant neoplasm of unspecified part of unspecified bronchus or lung: Secondary | ICD-10-CM

## 2021-08-17 DIAGNOSIS — J4489 Other specified chronic obstructive pulmonary disease: Secondary | ICD-10-CM

## 2021-08-17 DIAGNOSIS — R059 Cough, unspecified: Secondary | ICD-10-CM

## 2021-08-17 DIAGNOSIS — J449 Chronic obstructive pulmonary disease, unspecified: Secondary | ICD-10-CM

## 2021-08-17 DIAGNOSIS — R058 Other specified cough: Secondary | ICD-10-CM | POA: Diagnosis not present

## 2021-08-17 LAB — NITRIC OXIDE: FeNO level (ppb): 8

## 2021-08-17 NOTE — Progress Notes (Signed)
? ?'@Patient'  ID: Gina Cervantes, female    DOB: [birth year], 61 y.o.   MRN: [id number] ? ?Chief Complaint  ?Patient presents with  ? Follow-up  ?  Cough   ? ? ?Referring provider: ?Ladell Pier, MD ? ?HPI: ?61 year old female, former smoker followed for COPD/asthma and NSCLC with mets. She is a patient of Dr. Juline Patch and last seen in office on 12/01/2020. She is followed by Dr. Learta Codding with oncology. Past medical history significant for hx of anal cancer, hx of breast cancer, anxiety.  ? ?TEST/EVENTS:  ?07/08/2021 CT chest wo con: atheroscerlosis. There is a right lower paratracheal node 0.7 cm. There is a right small pleural effusion that appears stable. Overall, similar appearance and morphology of the right suprahilar and infrahilar nodular opacities, consistent with treated malignancy. There are ground glass density nodules in the LLL and LUL which are stable when compared to the January 2023 exam.  ?08/08/2021 CXR 2 View: nodularity of the right lung increased conspicuity from the prior plain film but relatively stable when compared to recent CT imaging. There is a small right pleural effusion which appears stable. There are coarsened interstitial markings throughout without new airspace disease.  ? ?08/17/2021: Today - follow up ?Patient presents today for overdue follow up. Since her last visit, she underwent bronchoscopy with Dr. Valeta Harms with pathology negative for malignancy. They were unable to biopsy the RLL lesion as it was next to a branch of the pulmonary artery. She underwent PET scan with low metabolic activity and no evidence of distant metastatic disease. She restarted chemotherapy but had to discontinue Sotoasib due to severe diarrhea, which is finally improving. She also had complications with an infected port, positive for MRSA and subsequently required removal. A few weeks ago, she developed URI symptoms with a cough and fever. CXR was obtained by Dr. Learta Codding without acute process. Today, she  reports feeling better. She's slowly improving with some residual productive cough with clear sputum. No recurrence of fevers. Nasal drainage has improved; although, she still has some postnasal drainage. She slowly is feeling better and like she is returning to normal.  Does not wish to do any further medications at this time.  She denies any recent hemoptysis, weight loss, anorexia, shortness of breath, or recurrent fevers.  Recently started Claritin back.  Feels like this is helping some.  She is also taking Mucinex DM twice daily.  Continues on Sunman twice daily.  Rare use of albuterol.  Primarily here to get her Breztri refilled.  No significant concerns or complaints. ? ?Allergies  ?Allergen Reactions  ? No Known Allergies   ? ? ?Immunization History  ?Administered Date(s) Administered  ? DTaP 08/22/1999, 08/21/2008  ? Influenza Inj Mdck Quad Pf 03/23/2017  ? Influenza Split 05/14/2020  ? Influenza,inj,Quad PF,6+ Mos 04/02/2018, 02/21/2019  ? Influenza-Unspecified 02/06/2016, 03/23/2017, 04/18/2018  ? PFIZER(Purple Top)SARS-COV-2 Vaccination 07/21/2019, 08/11/2019, 01/05/2020  ? ? ?Past Medical History:  ?Diagnosis Date  ? Anal cancer ([place]) 08/09/2012  ? Squamous cell  ? Anxiety   ? PANIC ATTACKS  ? Arthritis   ? Breast cancer ([place]) 2009  ? ER+PR+HER-2+  ? Ductal carcinoma ([place]) 02/2008  ? invasive   ? Heart palpitations   ? History of radiation therapy 09/02/12-10/10/12  ? anal 50.4Gy  ? History of shingles 10/2014  ? HPV (human papilloma virus) anogenital infection   ? vulvar/ freezing hx  ? Hx of radiation therapy 2020  ? Lung nodule   ? Malignant neoplasm  of upper lobe of left lung ([place]) 06/2016  ? Personal history of chemotherapy   ? Personal history of radiation therapy 2010  ? Pneumonia   ? NO RECENT PROBLEMS  ? PONV (postoperative nausea and vomiting)   ? Hypotension  ? Radiation 10/21/08-12/08/08  ? Right breast 6240 cGy  ? Shingles 2017  ? Status post chemotherapy 02/2008  ? Taxol/Herceptin  ? ? ?Tobacco  History: ?Social History  ? ?Tobacco Use  ?Smoking Status Former  ? Years: 8.00  ? Types: Cigarettes  ? Quit date: 2015  ? Years since quitting: 8.2  ?Smokeless Tobacco Never  ?Tobacco Comments  ? stopped smoking cigarettes Jan. 2018  ? ?Counseling given: Not Answered ?Tobacco comments: stopped smoking cigarettes Jan. 2018 ? ? ?Outpatient Medications Prior to Visit  ?Medication Sig Dispense Refill  ? albuterol (VENTOLIN HFA) 108 (90 Base) MCG/ACT inhaler TAKE 2 PUFFS BY MOUTH EVERY 6 HOURS AS NEEDED FOR WHEEZE OR SHORTNESS OF BREATH 18 each 2  ? Budeson-Glycopyrrol-Formoterol (BREZTRI AEROSPHERE) 160-9-4.8 MCG/ACT AERO Inhale 2 puffs into the lungs in the morning and at bedtime. 10.7 g 1  ? loratadine (CLARITIN) 10 MG tablet Take 10 mg by mouth as needed for allergies.    ? magnesium oxide (MAG-OX) 400 MG tablet Take 600 mg by mouth daily.    ? POTASSIUM CHLORIDE PO Take 550 mg by mouth daily.    ? CALCIUM PO Take 1,200 mg by mouth daily. D3 (Patient not taking: Reported on 07/08/2021)    ? Cyanocobalamin (VITAMIN B-12 PO) Take 2,500 mcg by mouth daily. Gummies-does not know dose (Patient not taking: Reported on 07/08/2021)    ? dextromethorphan-guaiFENesin (MUCINEX DM) 30-600 MG 12hr tablet Take 1 tablet by mouth 2 (two) times daily.    ? LORazepam (ATIVAN) 0.5 MG tablet Take 1 tablet (0.5 mg total) by mouth every 8 (eight) hours as needed for anxiety (or nausea). (Patient not taking: Reported on 08/17/2021) 60 tablet 0  ? Multiple Vitamins-Minerals (MULTIVITAMIN WITH MINERALS) tablet Take 1 tablet by mouth daily. (Patient not taking: Reported on 07/08/2021)    ? ?No facility-administered medications prior to visit.  ? ? ? ?Review of Systems:  ? ?Constitutional: No weight loss or gain, night sweats, fevers, chills +fatigue (improving) ?HEENT: No headaches, difficulty swallowing, tooth/dental problems, or sore throat. No sneezing, itching, ear ache. +nasal congestion/drainage (improving) ?CV:  No chest pain, orthopnea,  PND, swelling in lower extremities, anasarca, dizziness, palpitations, syncope ?Resp: +productive cough (improving). No shortness of breath with exertion or at rest. No hemoptysis. No wheezing.  No chest wall deformity ?GI:  No heartburn, indigestion, abdominal pain, nausea, vomiting, diarrhea, change in bowel habits, loss of appetite, bloody stools.  ?Skin: No rash, lesions, ulcerations ?MSK:  No joint pain or swelling.  No decreased range of motion.  No back pain. ?Neuro: No dizziness or lightheadedness.  ?Psych: No depression or anxiety. Mood stable.  ? ? ? ?Physical Exam: ? ?BP 110/60 (BP Location: Left Arm, Cuff Size: Normal)   Pulse 95   Ht '5\' 6"'  (1.676 m)   Wt 151 lb 12.8 oz (68.9 kg)   LMP 04/07/2008   SpO2 95%   BMI 24.50 kg/m?  ? ?GEN: Pleasant, interactive, well-appearing; in no acute distress. ?HEENT:  Normocephalic and atraumatic. EACs patent bilaterally. TM pearly gray with present light reflex bilaterally. PERRLA. Sclera white. Nasal turbinates erythematous, moist and patent bilaterally. Clear rhinorrhea present. Oropharynx erythematous and moist, without exudate or edema. No lesions, ulcerations  ?NECK:  Supple w/ fair ROM. No JVD present. Normal carotid impulses w/o bruits. Thyroid symmetrical with no goiter or nodules palpated. No lymphadenopathy.   ?CV: RRR, no m/r/g, no peripheral edema. Pulses intact, +2 bilaterally. No cyanosis, pallor or clubbing. ?PULMONARY:  Unlabored, regular breathing. Clear bilaterally A&P w/o wheezes/rales/rhonchi. No accessory muscle use. No dullness to percussion. ?GI: BS present and normoactive. Soft, non-tender to palpation. No organomegaly or masses detected. No CVA tenderness. ?MSK: No erythema, warmth or tenderness. Cap refil <2 sec all extrem. No deformities or joint swelling noted.  ?Neuro: A/Ox3. No focal deficits noted.   ?Skin: Warm, no lesions or rashe ?Psych: Normal affect and behavior. Judgement and thought content appropriate.  ? ? ? ?Lab  Results: ? ?CBC ?   ?Component Value Date/Time  ? WBC 5.8 08/08/2021 1440  ? WBC 7.6 07/10/2021 1229  ? RBC 4.09 08/08/2021 1440  ? HGB 14.0 08/08/2021 1440  ? HGB 13.5 01/02/2013 1510  ? HCT 41.9 08/08/2021 1440  ? HCT 39.1

## 2021-08-17 NOTE — Patient Instructions (Addendum)
-  Continue Breztri 2 puffs Twice daily. Brush tongue and rinse mouth afterwards ?-Continue Albuterol inhaler 2 puffs every 6 hours as needed for shortness of breath or wheezing. Notify if symptoms persist despite rescue inhaler/neb use. ?-Continue Claritin 10 mg daily as needed for allergies ?-Continue mucinex DM 1 tablet Twice daily  ? ?-Flonase nasal spray 1-2 sprays each nostril daily  ?-Saline nasal spray 2-3 times a day  ? ?Follow up in 3 months with Dr. Valeta Harms. If symptoms do not improve or worsen, please contact office for sooner follow up or seek emergency care.  ?

## 2021-08-19 ENCOUNTER — Encounter: Payer: Self-pay | Admitting: Nurse Practitioner

## 2021-08-19 DIAGNOSIS — R058 Other specified cough: Secondary | ICD-10-CM | POA: Insufficient documentation

## 2021-08-19 DIAGNOSIS — J449 Chronic obstructive pulmonary disease, unspecified: Secondary | ICD-10-CM | POA: Insufficient documentation

## 2021-08-19 NOTE — Assessment & Plan Note (Signed)
Followed by Dr. Learta Codding. Treatment currently on hold d/t recent events including severe diarrhea and infected port. Pt does not wish to resume treatments at this point per Dr. Carin Hock most recent note. Plan to f/u with him in 4 weeks. Recent CT chest stable.  ?

## 2021-08-19 NOTE — Assessment & Plan Note (Signed)
Cough seems to be consistent with upper airway irritation and postnasal drainage. Slowly improving. Nasal symptoms also resolving. Advised addition of intranasal steroid and saline spray. Continue mucinex DM for cough/congestion and claritin for allergy prevention ?

## 2021-08-19 NOTE — Assessment & Plan Note (Addendum)
Breathing overall stable. Recent CXR without superimposed infection or acute process. Maintained on triple therapy regimen. Rare use of albuterol. Suspect cough r/t postnasal drainage from recent URI and is improving. Advised her to notify if sputum becomes purulent or worsening symptoms develop.  ? ?Patient Instructions  ?-Continue Breztri 2 puffs Twice daily. Brush tongue and rinse mouth afterwards ?-Continue Albuterol inhaler 2 puffs every 6 hours as needed for shortness of breath or wheezing. Notify if symptoms persist despite rescue inhaler/neb use. ?-Continue Claritin 10 mg daily as needed for allergies ?-Continue mucinex DM 1 tablet Twice daily  ? ?-Flonase nasal spray 1-2 sprays each nostril daily  ?-Saline nasal spray 2-3 times a day  ? ?Follow up in 3 months with Dr. Valeta Harms. If symptoms do not improve or worsen, please contact office for sooner follow up or seek emergency care.  ? ? ?

## 2021-09-06 ENCOUNTER — Inpatient Hospital Stay: Payer: BC Managed Care – PPO | Attending: Oncology | Admitting: Oncology

## 2021-09-06 VITALS — BP 124/79 | HR 100 | Temp 98.2°F | Resp 18 | Ht 66.0 in | Wt 153.0 lb

## 2021-09-06 DIAGNOSIS — Z923 Personal history of irradiation: Secondary | ICD-10-CM | POA: Diagnosis not present

## 2021-09-06 DIAGNOSIS — Z170 Estrogen receptor positive status [ER+]: Secondary | ICD-10-CM | POA: Insufficient documentation

## 2021-09-06 DIAGNOSIS — C3490 Malignant neoplasm of unspecified part of unspecified bronchus or lung: Secondary | ICD-10-CM

## 2021-09-06 DIAGNOSIS — C3431 Malignant neoplasm of lower lobe, right bronchus or lung: Secondary | ICD-10-CM | POA: Insufficient documentation

## 2021-09-06 DIAGNOSIS — Z8614 Personal history of Methicillin resistant Staphylococcus aureus infection: Secondary | ICD-10-CM | POA: Diagnosis not present

## 2021-09-06 DIAGNOSIS — Z853 Personal history of malignant neoplasm of breast: Secondary | ICD-10-CM | POA: Insufficient documentation

## 2021-09-06 DIAGNOSIS — Z79899 Other long term (current) drug therapy: Secondary | ICD-10-CM | POA: Diagnosis not present

## 2021-09-06 NOTE — Progress Notes (Signed)
?[facility] ?OFFICE PROGRESS NOTE ? ? ?Diagnosis: Non-small cell lung cancer ? ?INTERVAL HISTORY:  ? ?Gina Cervantes returns as scheduled.  Diarrhea has resolved.  She feels well.  She is working.  She has discomfort at the left knee.  She relates this to "arthritis ".  No popliteal pain or swelling.  The upper respiratory symptoms she had last month have resolved.  She continues the [employer] inhaler. ? ? ?Objective: ? ?Vital signs in last 24 hours: ? ?Blood pressure 124/79, pulse 100, temperature 98.2 ?F (36.8 ?C), temperature source Oral, resp. rate 18, height _0  (1.676 m), weight 153 lb (69.4 kg), last menstrual period 04/07/2008, SpO2 98 %. ?  ?Lymphatics: No cervical, supraclavicular, or axillary nodes ?Resp: End inspiratory rhonchi at the right posterior base, no respiratory distress ?Cardio: Regular rate and rhythm ?GI: No hepatosplenomegaly ?Vascular: No leg edema  ?Skin: Hyperpigmentation at the right chest Port-A-Cath site ? ?Lab Results: ? ?Lab Results  ?Component Value Date  ? WBC 5.8 08/08/2021  ? HGB 14.0 08/08/2021  ? HCT 41.9 08/08/2021  ? MCV 102.4 (H) 08/08/2021  ? PLT 224 08/08/2021  ? NEUTROABS 3.9 08/08/2021  ? ? ?CMP  ?Lab Results  ?Component Value Date  ? NA 136 08/08/2021  ? K 4.0 08/08/2021  ? CL 94 (L) 08/08/2021  ? CO2 31 08/08/2021  ? GLUCOSE 117 (H) 08/08/2021  ? BUN 16 08/08/2021  ? CREATININE 0.98 08/08/2021  ? CALCIUM 9.5 08/08/2021  ? PROT 7.8 08/08/2021  ? ALBUMIN 3.6 08/08/2021  ? AST 23 08/08/2021  ? ALT 12 08/08/2021  ? ALKPHOS 68 08/08/2021  ? BILITOT 0.5 08/08/2021  ? GFRNONAA >60 08/08/2021  ? GFRAA >60 01/29/2020  ? ? ?Lab Results  ?Component Value Date  ? CEA 5.5 (H) 08/02/2012  ? ?Medications: I have reviewed the patient's current medications. ? ? ?Assessment/Plan: ?Anal cancer-left anal margin/anal canal mass, status post a biopsy on 08/09/2012 confirming invasive moderately differentiated squamous cell carcinoma,p16 positive.   ?-Staging PET scan  08/20/2012-hypermetabolic anal mass, no hypermetabolic metastases   ?-Initiation of concurrent radiation with cycle 1 of mitomycin C./5-fluorouracil 09/02/2012.   ?-She completed cycle 2 5-fluorouracil/mitomycin C. beginning 10/01/2012.   ?-She completed radiation 10/10/2012.   ?2. Right breast cancer 2009, ER positive, PR positive, HER-2 positive. She is status post neoadjuvant AC x4 cycles followed by Taxol/Herceptin. She underwent a right lumpectomy and sentinel lymph node biopsy 09/22/2008 with no evidence of residual invasive carcinoma and 5 negative sentinel lymph nodes. She then completed right breast radiation. She began tamoxifen 12/15/2008 and completed adjuvant Herceptin 05/25/2009. The tamoxifen was discontinued and she was switched to Arimidex in July 2013.  Arimidex discontinued in mid July 2019. ?3. History of enlargement of the right tonsil.   ?4. Nonspecific pulmonary nodules without hypermetabolic activity on the PET scan 08/20/2012. Chest CT 10/21/2012 with scattered pulmonary nodules including nodules that were not present in 2010. Annual surveillance was recommended. Follow-up chest CT 10/24/2013 with scattered groundglass densities again noted in both lungs mostly unchanged when compared to the previous study. A sub-solid right lower lobe nodule had increased central density. The size was unchanged.The nodule has been present since 2009. ?CT chest 06/13/2016-a sub-solid 9 mm right upper lobe nodule has enlarged compared to 2015, a right lower lobe irregular nodular density appears more well defined, new vague 5 mm groundglass left upper lobe nodule, left lower lobe 10 mm groundglass nodule is more apparent, posterior left upper lobe circumscribed solid nodule  measures 11 x 10 mm and is new ?PET scan 06/22/2016-malignant range activity involving the 11 mm left upper lobe nodule, no other hypermetabolic nodules, suspicious sub-solid right lower lobe nodule ?Wedge resection of a hypermetabolic  left upper lobe nodule on 07/05/2016-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 No EGFR, ALK, BRAF, RET, ERBB2, or ROS1 alteration ?CT chest 11/14/2016-status post left upper lobe wedge resection, stable right lung nodules ?Chest CT 05/02/2017-stable bilateral solid and groundglass nodules, stable mild mediastinal lymphadenopathy ?CT chest 12/13/2017- enlargement of right upper lobe nodule, other lung nodules and chest lymph node stable ?PET 01/09/2018- right upper lobe nodule and right lower lobe nodule have enlarged since 2018 and have low level metabolic activity increased size of an 8 mm right upper lobe nodule without increased metabolic activity, stable activity and a mildly enlarged lower paratracheal node, no evidence of metastatic disease in the abdomen or pelvis ?CT chest 04/22/2018- no new lung nodules, dominant right lung nodules slightly increased in size ?Bronchoscopy/EBUS 05/04/2019 with biopsy of level 7 node, biopsy of right lower lobe nodule, FNA of right lower lobe nodule-negative ?CT biopsy of right upper lobe nodule 06/07/2018- adenocarcinoma with lepidic and acinar patterns ?SBRT to the dominant right upper lobe nodule 07/23/2018-07/30/2018 ?CT 09/16/2018-slight increase in size of medial right upper lobe nodule-possibly secondary to interval radiation, other nodules are unchanged ?CT 12/25/2018- medial right upper lobe nodule has decreased in size slightly, multiple additional solid and groundglass nodules are stable ?CT 06/09/2019-enlarging right lower lobe nodule, enlarging subsolid nodule, new right upper lobe nodule ?SBRT to right lower lobe lung nodule, 3 fractions 07/08/2019-07/14/2019 ?CT 12/05/2019-dominant right lower lobe lesion stable to slightly decreased in size, progressive solid component associated with a dominant right upper lung nodule, mild progression of a left  upper lobe nodule, new subsolid anterior medial left upper lobe nodule ?Cycle 1 Alimta/carboplatin/pembrolizumab 02/04/2020 ?Cycle 2 Alimta/carboplatin/pembrolizumab 02/23/2020 ?Cycle 3 Alimta/carboplatin/pembrolizumab 03/15/2020 ?Cycle 4 Alimta/carboplatin/pembrolizumab 04/05/2020 (Alimta and carboplatin dose reduced secondary to anemia) ?CT chest 04/12/2020-decreased size of solid right lower lobe and right upper lobe nodules, groundglass nodules in the left lung unchanged ?Cycle 5 Alimta/pembrolizumab 05/03/2020 ?Cycle 6 Alimta/pembrolizumab 05/21/2020 ?Cycle 7 Alimta/pembrolizumab 06/14/2020 ?Cycle 8 Alimta/pembrolizumab 07/05/2020 ?CT chest 07/22/2020-stable nodules, no evidence of disease progression, unchanged multifocal groundglass nodules suspicious for multifocal adenocarcinoma. ?Cycle 9 Alimta/Pembrolizumab 07/26/2020 ?Cycle 10 Alimta/Pembrolizumab 08/16/2020 ?Cycle 11 Alimta/Pembrolizumab 09/06/2020 ?Cycle 12 Alimta/pembrolizumab 09/27/2020 ?Cycle 13 Alimta/pembrolizumab 10/18/2020 ?CT chest 11/12/2020-enlarging soft tissue density right upper lobe medially adjacent to the mediastinum measuring 2.7 x 2.1 cm, concerning for recurrent tumor.  Stable radiation changes involving the right upper lobe and right lower lobe.  Stable scattered small solid and subsolid pulmonary nodules bilaterally.  New right-sided pleural effusion.  Stable borderline enlarged mediastinal lymph nodes. ?Bronchoscopy 12/28/2020-right upper lobe and left lower lobe lesions biopsy-negative pathology ?PET 8/46/6599-JTT metabolic activity involving the right suprahilar consolidative process-favored benign, measured smaller, additional groundglass densities with very low-level metabolic activity, radiation change in the right lower lobe, resolution of right pleural effusion, no distant metastatic disease ?Cycle 14 Alimta/pembrolizumab 01/19/2021 ?Cycle 15 Alimta/pembrolizumab 02/07/2021 ?Cycle 16 of Alimta/pembrolizumab 02/28/2021 ?Cycle 17  Alimta/pembrolizumab 03/28/2021 ?CT chest 04/18/2021-increased areas of masslike density in the right upper lobe and right lower lobe with a new right lower lobe satellite nodule, other groundglass attenuation lesions are stable, s

## 2021-09-21 DIAGNOSIS — K625 Hemorrhage of anus and rectum: Secondary | ICD-10-CM | POA: Diagnosis not present

## 2021-09-21 DIAGNOSIS — Z85048 Personal history of other malignant neoplasm of rectum, rectosigmoid junction, and anus: Secondary | ICD-10-CM | POA: Diagnosis not present

## 2021-09-21 DIAGNOSIS — K6289 Other specified diseases of anus and rectum: Secondary | ICD-10-CM | POA: Diagnosis not present

## 2021-09-21 DIAGNOSIS — Z85038 Personal history of other malignant neoplasm of large intestine: Secondary | ICD-10-CM | POA: Diagnosis not present

## 2021-09-21 DIAGNOSIS — Z1211 Encounter for screening for malignant neoplasm of colon: Secondary | ICD-10-CM | POA: Diagnosis not present

## 2021-10-10 MED ORDER — BREZTRI AEROSPHERE 160-9-4.8 MCG/ACT IN AERO: 2.0000 | INHALATION_SPRAY | Freq: Two times a day (BID) | RESPIRATORY_TRACT | 3 refills | Status: DC

## 2021-10-24 ENCOUNTER — Ambulatory Visit (HOSPITAL_BASED_OUTPATIENT_CLINIC_OR_DEPARTMENT_OTHER): Payer: BC Managed Care – PPO

## 2021-10-25 ENCOUNTER — Inpatient Hospital Stay: Payer: BC Managed Care – PPO | Admitting: Oncology

## 2021-10-27 ENCOUNTER — Ambulatory Visit (HOSPITAL_BASED_OUTPATIENT_CLINIC_OR_DEPARTMENT_OTHER)
Admission: RE | Admit: 2021-10-27 | Discharge: 2021-10-27 | Disposition: A | Discharge status: Home / Self Care | Payer: BC Managed Care – PPO | Source: Ambulatory Visit | Attending: Oncology | Admitting: Oncology

## 2021-10-27 DIAGNOSIS — C3490 Malignant neoplasm of unspecified part of unspecified bronchus or lung: Secondary | ICD-10-CM

## 2021-10-27 DIAGNOSIS — R918 Other nonspecific abnormal finding of lung field: Secondary | ICD-10-CM | POA: Diagnosis not present

## 2021-10-27 DIAGNOSIS — J90 Pleural effusion, not elsewhere classified: Secondary | ICD-10-CM | POA: Diagnosis not present

## 2021-10-27 MED ORDER — IOHEXOL 300 MG/ML  SOLN
100.0000 mL | Freq: Once | INTRAMUSCULAR | Status: AC | PRN
Start: 2021-10-27 — End: 2021-10-27
  Administered 2021-10-27: 100 mL via INTRAVENOUS

## 2021-10-31 ENCOUNTER — Inpatient Hospital Stay: Payer: BC Managed Care – PPO | Attending: Oncology | Admitting: Oncology

## 2021-10-31 VITALS — BP 107/67 | HR 92 | Temp 98.1°F | Resp 18 | Ht 66.0 in | Wt 155.4 lb

## 2021-10-31 DIAGNOSIS — C3490 Malignant neoplasm of unspecified part of unspecified bronchus or lung: Secondary | ICD-10-CM

## 2021-10-31 DIAGNOSIS — Z85118 Personal history of other malignant neoplasm of bronchus and lung: Secondary | ICD-10-CM | POA: Diagnosis not present

## 2021-11-18 ENCOUNTER — Other Ambulatory Visit (HOSPITAL_COMMUNITY): Payer: Self-pay

## 2021-11-23 DIAGNOSIS — N3000 Acute cystitis without hematuria: Secondary | ICD-10-CM | POA: Diagnosis not present

## 2021-11-28 ENCOUNTER — Other Ambulatory Visit: Payer: Self-pay

## 2021-11-29 ENCOUNTER — Ambulatory Visit (INDEPENDENT_AMBULATORY_CARE_PROVIDER_SITE_OTHER): Payer: BC Managed Care – PPO | Admitting: Pulmonary Disease

## 2021-11-29 ENCOUNTER — Encounter: Payer: Self-pay | Admitting: Pulmonary Disease

## 2021-11-29 VITALS — BP 120/62 | HR 112 | Temp 98.4°F | Ht 66.0 in | Wt 158.4 lb

## 2021-11-29 DIAGNOSIS — R918 Other nonspecific abnormal finding of lung field: Secondary | ICD-10-CM

## 2021-11-29 DIAGNOSIS — C3490 Malignant neoplasm of unspecified part of unspecified bronchus or lung: Secondary | ICD-10-CM

## 2021-11-29 DIAGNOSIS — Z85048 Personal history of other malignant neoplasm of rectum, rectosigmoid junction, and anus: Secondary | ICD-10-CM | POA: Diagnosis not present

## 2021-11-29 DIAGNOSIS — Z85118 Personal history of other malignant neoplasm of bronchus and lung: Secondary | ICD-10-CM

## 2021-11-29 DIAGNOSIS — Z853 Personal history of malignant neoplasm of breast: Secondary | ICD-10-CM | POA: Diagnosis not present

## 2021-11-29 NOTE — Patient Instructions (Addendum)
Thank you for visiting Dr. Valeta Harms at [facility]. Today we recommend the following:  Call me if you need anything.   Return in about 1 year (around 11/30/2022), or if symptoms worsen or fail to improve.    Please do your part to reduce the spread of COVID-19.

## 2021-11-29 NOTE — Progress Notes (Signed)
Synopsis: Referred in July 2022 for abnormal CT chest, oncology: Dr. Benay Spice, PCP: By Ladell Pier, MD  Subjective:   PATIENT ID: Gina Cervantes GENDER: female DOB: July 03, 1960, MRN: [id number]  Chief Complaint  Patient presents with   Follow-up    Follow up for cough. Pt states her cough is doing better since last visit. Pt is on Breztri daily and Albuterol as needed. Last saw BI last year.     61 year old female, past medical history of anal cancer in 2014, breast cancer in 2009, lung cancer in 2018.  Patient has underwent chemotherapy as well as radiation treatments.  She is followed by Dr. Benay Spice and Ned Card, NP at the [facility].  She is currently undergoing treatment for her non-small cell lung cancer.  She completed an additional cycle of Alimta plus pembrolizumab in June 2022.  Overall her history includes a anal cancer, left anal margin, anal canal mass which was a moderately differentiated squamous cell carcinoma, p16 positive no evidence of hypermetabolic metastasis treated with concurrent radiation and chemotherapy.  In 2009 she had a right breast cancer that was ER/PR positive, HER2 positive.  She had a subsolid right lower lobe nodule that has slowly increased in size.  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  Ultimately in December 2020 underwent a biopsy of the level 7 lymph node that was negative.  Had a CT-guided biopsy of a right upper lobe nodule that had lipidic adenocarcinoma with acinar patterns.  She had SBRT treatments to a right lower lobe nodule that was slowly enlarging.  In July 2021 she had a lower lobe lesion that had slightly decreased in size but had progressive component associated with a dominant right upper lobe nodule.  Patient was started on treatments with Alimta, carboplatinum plus pembrolizumab.  Patient had a repeat CT scan  of the chest in July 2022 which showed enlarging soft tissue density in the right upper lobe medial adjacent to the mediastinum measuring 2.7 x 2.1 cm concerning for recurrent malignancy.  Additionally there is a small new right sided pleural effusion mediastinal adenopathy was stable. Former smoker quit in 2009, 20+ years, <1ppd.   OV 11/29/2021:Patient here today for office follow-up.  Office note from Dr. Benay Spice 11/01/2021 reviewed.  Overall doing well.  Asymptomatic from non-small cell lung cancer.  CT imaging stable.  She is doing great today.  She is very upbeat has no complaints feels like she is breathing well.  Currently under image surveillance with Dr. Benay Spice and oncology.  She has a trip planned to [place] coming up in September.  She just got back from [place] a couple of weeks ago.    Oncology History Overview Note  Dr. Vic Ripper reviewed the pathology slides from 2018 and 2020.  He reports the 2018 case is consistent with adenocarcinoma with a less than 5% squamous component.  The 2020 case is involved with a comparable carcinoma, but direct comparison is difficult due to the limited available tissue on the 2020 case.   Lung cancer, left  upper lobe ([place])  07/05/2016 Initial Diagnosis   Lung cancer, left  upper lobe ([place])   02/04/2020 - 10/18/2020 Chemotherapy         01/19/2021 - 03/28/2021 Chemotherapy   Patient is on Treatment Plan : LUNG CARBOplatin / Pemetrexed / Pembrolizumab q21d Induction x 4 cycles / Maintenance Pemetrexed +  Pembrolizumab     06/20/2021 -  Chemotherapy   Patient is on Treatment Plan : NON-SMALL CELL LUNG RESEARCH SWOG S1900E Sotorasib (AMGEN 510) q21d     Non-small cell lung cancer with metastasis ([place])  12/16/2020 Initial Diagnosis   Non-small cell lung cancer with metastasis ([place])   01/19/2021 - 03/28/2021 Chemotherapy   Patient is on Treatment Plan : LUNG CARBOplatin / Pemetrexed / Pembrolizumab q21d Induction x 4 cycles / Maintenance Pemetrexed +  Pembrolizumab     06/20/2021 -  Chemotherapy   Patient is on Treatment Plan : NON-SMALL CELL LUNG RESEARCH SWOG S1900E Sotorasib (AMGEN 510) q21d        Past Medical History:  Diagnosis Date   Anal cancer ([place]) 08/09/2012   Squamous cell   Anxiety    PANIC ATTACKS   Arthritis    Breast cancer ([place]) 2009   ER+PR+HER-2+   Ductal carcinoma ([place]) 02/2008   invasive    Heart palpitations    History of radiation therapy 09/02/12-10/10/12   anal 50.4Gy   History of shingles 10/2014   HPV (human papilloma virus) anogenital infection    vulvar/ freezing hx   Hx of radiation therapy 2020   Lung nodule    Malignant neoplasm of upper lobe of left lung ([place]) 06/2016   Personal history of chemotherapy    Personal history of radiation therapy 2010   Pneumonia    NO RECENT PROBLEMS   PONV (postoperative nausea and vomiting)    Hypotension   Radiation 10/21/08-12/08/08   Right breast 6240 cGy   Shingles 2017   Status post chemotherapy 02/2008   Taxol/Herceptin     Family History  Problem Relation Age of Onset   Lung cancer Father    Stroke Mother    Breast cancer Neg Hx      Past Surgical History:  Procedure Laterality Date   BREAST BIOPSY Right 02/04/2008   malignant   BREAST LUMPECTOMY Right 2010   malignant   BREAST LUMPECTOMY WITH AXILLARY LYMPH NODE BIOPSY Right 5/10   Dr. Marlou Starks   BRONCHIAL BIOPSY  12/28/2020   Procedure: BRONCHIAL BIOPSIES;  Surgeon: Garner Nash, DO;  Location: [facility];  Service: Pulmonary;;   BRONCHIAL BRUSHINGS  12/28/2020   Procedure: BRONCHIAL BRUSHINGS;  Surgeon: Garner Nash, DO;  Location: [facility];  Service: Pulmonary;;   BRONCHIAL NEEDLE ASPIRATION BIOPSY  12/28/2020   Procedure: BRONCHIAL NEEDLE ASPIRATION BIOPSIES;  Surgeon: Garner Nash, DO;  Location: [facility];  Service: Pulmonary;;   BRONCHIAL WASHINGS  12/28/2020   Procedure: BRONCHIAL WASHINGS;  Surgeon: Garner Nash, DO;  Location: [facility];  Service:  Pulmonary;;   CESAREAN SECTION     COLONOSCOPY W/ POLYPECTOMY     ENDOBRONCHIAL ULTRASOUND  12/28/2020   Procedure: ENDOBRONCHIAL ULTRASOUND;  Surgeon: Garner Nash, DO;  Location: [facility];  Service: Pulmonary;;   EVALUATION UNDER ANESTHESIA WITH ANAL FISTULECTOMY N/A 08/09/2012   Procedure: EXAM UNDER ANESTHESIA AND BIOPSY OF ANAL MASS;  Surgeon: Odis Hollingshead, MD;  Location: [facility];  Service: General;  Laterality: N/A;   IR IMAGING GUIDED PORT INSERTION  01/12/2021   IR RADIOLOGIST EVAL & MGMT  03/11/2021   IR REMOVAL TUN ACCESS W/ PORT W/O FL MOD SED  02/24/2021   KNEE ARTHROSCOPY Left    left knee   LUNG LOBECTOMY Left    PORTACATH PLACEMENT  03/2008   dr. Marlou Starks    REMOVAL PORTACATH  2011   VIDEO ASSISTED THORACOSCOPY (VATS)/WEDGE  RESECTION Left 07/05/2016   Procedure: VIDEO ASSISTED THORACOSCOPY (VATS)/WEDGE RESECTION left upper lobe,  lymph node dissection and placement of OnQ catheter;  Surgeon: [name], MD;  Location: [facility];  Service: Thoracic;  Laterality: Left;   VIDEO BRONCHOSCOPY N/A 07/05/2016   Procedure: VIDEO BRONCHOSCOPY;  Surgeon: [name], MD;  Location[facility];  Service: Thoracic;  Laterality: N/A;   VIDEO BRONCHOSCOPY N/A 05/03/2018   Procedure: VIDEO BRONCHOSCOPY;  Surgeon: [name], MD;  Location: [facility];  Service: Thoracic;  Laterality: N/A;   VIDEO BRONCHOSCOPY WITH ENDOBRONCHIAL NAVIGATION N/A 05/03/2018   Procedure: VIDEO BRONCHOSCOPY WITH ENDOBRONCHIAL NAVIGATION WITH BIOPSY;  Surgeon: [name], MD;  Location: [facility];  Service: Thoracic;  Laterality: N/A;   VIDEO BRONCHOSCOPY WITH ENDOBRONCHIAL NAVIGATION Bilateral 12/28/2020   Procedure: VIDEO BRONCHOSCOPY WITH ENDOBRONCHIAL NAVIGATION;  Surgeon: Garner Nash, DO;  Location: [facility];  Service: Pulmonary;  Laterality: Bilateral;  ION   VIDEO BRONCHOSCOPY WITH ENDOBRONCHIAL ULTRASOUND N/A 05/03/2018   Procedure: VIDEO BRONCHOSCOPY WITH ENDOBRONCHIAL ULTRASOUND;   Surgeon: [name], MD;  Location: [facility];  Service: Thoracic;  Laterality: N/A;   VIDEO BRONCHOSCOPY WITH RADIAL ENDOBRONCHIAL ULTRASOUND  12/28/2020   Procedure: RADIAL ENDOBRONCHIAL ULTRASOUND;  Surgeon: Garner Nash, DO;  Location: [facility];  Service: Pulmonary;;    Social History   Socioeconomic History   Marital status: Married    Spouse name: Not on file   Number of children: Not on file   Years of education: Not on file   Highest education level: Not on file  Occupational History    Employer: [employer], [place],     Comment: Realtor  Tobacco Use   Smoking status: Former    Years: 8.00    Types: Cigarettes    Quit date: 2015    Years since quitting: 8.5   Smokeless tobacco: Never   Tobacco comments:    stopped smoking cigarettes Jan. 2018  Vaping Use   Vaping Use: Never used  Substance and Sexual Activity   Alcohol use: Not Currently    Alcohol/week: 0.0 - 5.0 standard drinks of alcohol    Comment: every day   Drug use: No   Sexual activity: Yes    Partners: Male    Birth control/protection: Other-see comments, Post-menopausal    Comment: vasectomy  Other Topics Concern   Not on file  Social History Narrative   Married   Lost a son age 68 this year and husband's brother, still going through grieving process.   No family history of breast or ovarian cancer.   Employed as Patent examiner Strain: Not on file  Food Insecurity: Not on file  Transportation Needs: No Transportation Needs (06/25/2018)   PRAPARE - Hydrologist (Medical): No    Lack of Transportation (Non-Medical): No  Physical Activity: Not on file  Stress: Not on file  Social Connections: Not on file  Intimate Partner Violence: Not on file     Allergies  Allergen Reactions   No Known Allergies      Outpatient Medications Prior to Visit  Medication Sig Dispense Refill   albuterol  (VENTOLIN HFA) 108 (90 Base) MCG/ACT inhaler TAKE 2 PUFFS BY MOUTH EVERY 6 HOURS AS NEEDED FOR WHEEZE OR SHORTNESS OF BREATH 18 each 2   Budeson-Glycopyrrol-Formoterol (BREZTRI AEROSPHERE) 160-9-4.8 MCG/ACT AERO Inhale 2 puffs into the lungs in the morning and at  bedtime. 10.7 g 3   CALCIUM PO Take 1,200 mg by mouth daily. D3 (Patient not taking: Reported on 07/08/2021)     Cyanocobalamin (VITAMIN B-12 PO) Take 2,500 mcg by mouth daily. Gummies-does not know dose (Patient not taking: Reported on 07/08/2021)     loratadine (CLARITIN) 10 MG tablet Take 10 mg by mouth as needed for allergies. (Patient not taking: Reported on 10/31/2021)     LORazepam (ATIVAN) 0.5 MG tablet Take 1 tablet (0.5 mg total) by mouth every 8 (eight) hours as needed for anxiety (or nausea). (Patient not taking: Reported on 08/17/2021) 60 tablet 0   magnesium oxide (MAG-OX) 400 MG tablet Take 600 mg by mouth daily. (Patient not taking: Reported on 10/31/2021)     Multiple Vitamins-Minerals (MULTIVITAMIN WITH MINERALS) tablet Take 1 tablet by mouth daily. (Patient not taking: Reported on 07/08/2021)     POTASSIUM CHLORIDE PO Take 550 mg by mouth daily. (Patient not taking: Reported on 09/06/2021)     No facility-administered medications prior to visit.    Review of Systems  Constitutional:  Negative for chills, fever, malaise/fatigue and weight loss.  HENT:  Negative for hearing loss, sore throat and tinnitus.   Eyes:  Negative for blurred vision and double vision.  Respiratory:  Negative for cough, hemoptysis, sputum production, shortness of breath, wheezing and stridor.   Cardiovascular:  Negative for chest pain, palpitations, orthopnea, leg swelling and PND.  Gastrointestinal:  Negative for abdominal pain, constipation, diarrhea, heartburn, nausea and vomiting.  Genitourinary:  Negative for dysuria, hematuria and urgency.  Musculoskeletal:  Negative for joint pain and myalgias.  Skin:  Negative for itching and rash.  Neurological:   Negative for dizziness, tingling, weakness and headaches.  Endo/Heme/Allergies:  Negative for environmental allergies. Does not bruise/bleed easily.  Psychiatric/Behavioral:  Negative for depression. The patient is not nervous/anxious and does not have insomnia.   All other systems reviewed and are negative.    Objective:  Physical Exam Vitals reviewed.  Constitutional:      General: She is not in acute distress.    Appearance: She is well-developed.  HENT:     Head: Normocephalic and atraumatic.  Eyes:     General: No scleral icterus.    Conjunctiva/sclera: Conjunctivae normal.     Pupils: Pupils are equal, round, and reactive to light.  Neck:     Vascular: No JVD.     Trachea: No tracheal deviation.  Cardiovascular:     Rate and Rhythm: Normal rate and regular rhythm.     Heart sounds: Normal heart sounds. No murmur heard. Pulmonary:     Effort: Pulmonary effort is normal. No tachypnea, accessory muscle usage or respiratory distress.     Breath sounds: No stridor. No wheezing, rhonchi or rales.  Abdominal:     General: There is no distension.     Palpations: Abdomen is soft.     Tenderness: There is no abdominal tenderness.  Musculoskeletal:        General: No tenderness.     Cervical back: Neck supple.  Lymphadenopathy:     Cervical: No cervical adenopathy.  Skin:    General: Skin is warm and dry.     Capillary Refill: Capillary refill takes less than 2 seconds.     Findings: No rash.  Neurological:     Mental Status: She is alert and oriented to person, place, and time.  Psychiatric:        Behavior: Behavior normal.      Vitals:  11/29/21 1529  BP: 120/62  Pulse: (!) 112  Temp: 98.4 F (36.9 C)  TempSrc: Oral  SpO2: 93%  Weight: 158 lb 6.4 oz (71.8 kg)  Height: '5\' 6"'  (1.676 m)    93% on 2 L BMI Readings from Last 3 Encounters:  11/29/21 25.57 kg/m  10/31/21 25.08 kg/m  09/06/21 24.69 kg/m   Wt Readings from Last 3 Encounters:  11/29/21 158  lb 6.4 oz (71.8 kg)  10/31/21 155 lb 6.4 oz (70.5 kg)  09/06/21 153 lb (69.4 kg)     CBC    Component Value Date/Time   WBC 5.8 08/08/2021 1440   WBC 7.6 07/10/2021 1229   RBC 4.09 08/08/2021 1440   HGB 14.0 08/08/2021 1440   HGB 13.5 01/02/2013 1510   HCT 41.9 08/08/2021 1440   HCT 39.1 01/02/2013 1510   PLT 224 08/08/2021 1440   PLT 240 01/02/2013 1510   MCV 102.4 (H) 08/08/2021 1440   MCV 105.7 (H) 01/02/2013 1510   MCH 34.2 (H) 08/08/2021 1440   MCHC 33.4 08/08/2021 1440   RDW 12.1 08/08/2021 1440   RDW 13.6 01/02/2013 1510   LYMPHSABS 0.9 08/08/2021 1440   LYMPHSABS 1.6 01/02/2013 1510   MONOABS 0.8 08/08/2021 1440   MONOABS 0.7 01/02/2013 1510   EOSABS 0.1 08/08/2021 1440   EOSABS 0.2 01/02/2013 1510   BASOSABS 0.0 08/08/2021 1440   BASOSABS 0.0 01/02/2013 1510     Chest Imaging: 11/12/2020 CT chest: Enlarging soft tissue density, right upper lobe medial adjacent to the mediastinum measuring 2.7 x 2.1 cm.  Additionally has a right lower lobe lesion as well as a left lower lobe lesion of which are smaller but still concerning for malignancy. The patient's images have been independently reviewed by me.    Pulmonary Functions Testing Results:    Latest Ref Rng & Units 06/30/2016   11:04 AM  PFT Results  FVC-Pre L 2.03   FVC-Predicted Pre % 56   FVC-Post L 2.57   FVC-Predicted Post % 71   Pre FEV1/FVC % % 73   Post FEV1/FCV % % 73   FEV1-Pre L 1.48   FEV1-Predicted Pre % 52   FEV1-Post L 1.88   DLCO uncorrected ml/min/mmHg 17.68   DLCO UNC% % 69   DLVA Predicted % 103   TLC L 5.14   TLC % Predicted % 98   RV % Predicted % 152     FeNO:   Pathology:   Echocardiogram:   Heart Catheterization:     Assessment & Plan:     ICD-10-CM   1. Non-small cell lung cancer with metastasis (H[facility])  C34.90     2. Lung nodules  R91.8     3. History of anal cancer  Z85.048     4. History of breast cancer  Z85.3     5. History of lung cancer  Z85.118        Discussion:  61 year old female very complex medical history, multiple malignancies, 3 separate malignancies in her past history of breast cancer, anal cancer and non-small cell lung cancer.  She has had several changing subsolid lesions within the chest.  Her repeat CT imaging is stable.  She started a attempt at new chemo and had a port rejection with a MRSA and site infection which has been removed no plans to move forward with any treatments at this time.  Follows closely with medical oncology and plan continue surveillance imaging.  Plan: Thankfully she is off oxygen at  this time. Continue surveillance imaging per medical oncology. She is going to follow-up with [place] in 1 year or as needed.    Current Outpatient Medications:    albuterol (VENTOLIN HFA) 108 (90 Base) MCG/ACT inhaler, TAKE 2 PUFFS BY MOUTH EVERY 6 HOURS AS NEEDED FOR WHEEZE OR SHORTNESS OF BREATH, Disp: 18 each, Rfl: 2   Budeson-Glycopyrrol-Formoterol (BREZTRI AEROSPHERE) 160-9-4.8 MCG/ACT AERO, Inhale 2 puffs into the lungs in the morning and at bedtime., Disp: 10.7 g, Rfl: 3   CALCIUM PO, Take 1,200 mg by mouth daily. D3 (Patient not taking: Reported on 07/08/2021), Disp: , Rfl:    Cyanocobalamin (VITAMIN B-12 PO), Take 2,500 mcg by mouth daily. Gummies-does not know dose (Patient not taking: Reported on 07/08/2021), Disp: , Rfl:    loratadine (CLARITIN) 10 MG tablet, Take 10 mg by mouth as needed for allergies. (Patient not taking: Reported on 10/31/2021), Disp: , Rfl:    LORazepam (ATIVAN) 0.5 MG tablet, Take 1 tablet (0.5 mg total) by mouth every 8 (eight) hours as needed for anxiety (or nausea). (Patient not taking: Reported on 08/17/2021), Disp: 60 tablet, Rfl: 0   magnesium oxide (MAG-OX) 400 MG tablet, Take 600 mg by mouth daily. (Patient not taking: Reported on 10/31/2021), Disp: , Rfl:    Multiple Vitamins-Minerals (MULTIVITAMIN WITH MINERALS) tablet, Take 1 tablet by mouth daily. (Patient not taking: Reported on  07/08/2021), Disp: , Rfl:    POTASSIUM CHLORIDE PO, Take 550 mg by mouth daily. (Patient not taking: Reported on 09/06/2021), Disp: , Rfl:    Garner Nash, DO [facility] 11/29/2021 3:49 PM

## 2021-12-06 ENCOUNTER — Other Ambulatory Visit: Payer: Self-pay

## 2021-12-08 ENCOUNTER — Other Ambulatory Visit: Payer: Self-pay | Admitting: Oncology

## 2022-01-02 ENCOUNTER — Encounter: Payer: Self-pay | Admitting: Oncology

## 2022-01-02 ENCOUNTER — Ambulatory Visit: Payer: BC Managed Care – PPO | Attending: Internal Medicine

## 2022-01-02 ENCOUNTER — Other Ambulatory Visit (HOSPITAL_BASED_OUTPATIENT_CLINIC_OR_DEPARTMENT_OTHER): Payer: Self-pay

## 2022-01-02 DIAGNOSIS — Z23 Encounter for immunization: Secondary | ICD-10-CM

## 2022-01-02 MED ORDER — PFIZER COVID-19 VAC BIVALENT 30 MCG/0.3ML IM SUSP
INTRAMUSCULAR | 0 refills | Status: DC
Start: 2022-01-02 — End: 2022-06-12
  Filled 2022-01-02: qty 0.3, 1d supply, fill #0

## 2022-01-02 NOTE — Progress Notes (Signed)
   Covid-19 Vaccination Clinic  Name:  Gina Cervantes    MRN: [id number] DOB: 05/01/1961  01/02/2022  [name] was observed post Covid-19 immunization for 15 minutes without incident. She was provided with Vaccine Information Sheet and instruction to access the V-Safe system.   [name] was instructed to call 911 with any severe reactions post vaccine: Difficulty breathing  Swelling of face and throat  A fast heartbeat  A bad rash all over body  Dizziness and weakness   Immunizations Administered     Name Date Dose VIS Date Route   Pfizer Covid-19 Vaccine Bivalent Booster 01/02/2022 11:45 AM 0.3 mL 01/05/2021 Intramuscular   Manufacturer: [employer]   Lot: [id number]   [place]: [id number]

## 2022-01-03 ENCOUNTER — Other Ambulatory Visit: Payer: Self-pay | Admitting: Obstetrics & Gynecology

## 2022-01-03 DIAGNOSIS — Z1231 Encounter for screening mammogram for malignant neoplasm of breast: Secondary | ICD-10-CM

## 2022-01-04 ENCOUNTER — Other Ambulatory Visit: Payer: Self-pay | Admitting: Oncology

## 2022-01-20 ENCOUNTER — Ambulatory Visit (HOSPITAL_BASED_OUTPATIENT_CLINIC_OR_DEPARTMENT_OTHER): Payer: BC Managed Care – PPO | Admitting: Medical

## 2022-01-23 ENCOUNTER — Telehealth: Payer: Self-pay | Admitting: *Deleted

## 2022-01-23 NOTE — Telephone Encounter (Signed)
Left VM w/CT chest appointment at [facility] on 02/17/22 at 5pm with 4:45 pm arrival. No prep. Left # for central scheduling if she needs to make a change.

## 2022-02-07 ENCOUNTER — Telehealth (HOSPITAL_BASED_OUTPATIENT_CLINIC_OR_DEPARTMENT_OTHER): Payer: Self-pay | Admitting: Advanced Practice Midwife

## 2022-02-07 ENCOUNTER — Ambulatory Visit (HOSPITAL_BASED_OUTPATIENT_CLINIC_OR_DEPARTMENT_OTHER): Payer: BC Managed Care – PPO | Admitting: Advanced Practice Midwife

## 2022-02-07 ENCOUNTER — Encounter (HOSPITAL_BASED_OUTPATIENT_CLINIC_OR_DEPARTMENT_OTHER): Payer: Self-pay

## 2022-02-07 DIAGNOSIS — R87618 Other abnormal cytological findings on specimens from cervix uteri: Secondary | ICD-10-CM | POA: Insufficient documentation

## 2022-02-07 NOTE — Telephone Encounter (Signed)
Called patient and left a message to please call and reschedule her missed appointment.

## 2022-02-07 NOTE — Progress Notes (Deleted)
    Subjective:     Gina Cervantes is a 61 y.o. female here at [facility] *** for a routine exam.  Current complaints: ***.  Personal health questionnaire reviewed: {yes/no:9010}.  Do you have a primary care provider? *** Do you feel safe at home? ***  [facility] Office Visit from 04/26/2021 in [facility]  PHQ-2 Total Score 0       Health Maintenance Due  Topic Date Due   HIV Screening  Never done   TETANUS/TDAP  Never done   Zoster Vaccines- Shingrix (1 of 2) Never done   INFLUENZA VACCINE  12/06/2021     Risk factors for chronic health problems: Smoking: Alchohol/how much: Pt BMI: There is no height or weight on file to calculate BMI.   Gynecologic History Patient's last menstrual period was 04/07/2008. Contraception: {method:5051} Last Pap: 12/20/20. Results were: normal cytology, HPV positive Last mammogram: 02/22/21. Results were: normal  Obstetric History OB History  Gravida Para Term Preterm AB Living  4 2     2 1   SAB IAB Ectopic Multiple Live Births  2       2    # Outcome Date GA Lbr Len/2nd Weight Sex Delivery Anes PTL Lv  4 Para 01/1992    M Vag-Spont   LIV  3 Para 07/1988    M CS-Unspec   LIV  2 SAB           1 SAB             Obstetric Comments  G2,P2  menarce age 79  Oldest son passed 2014     {Common ambulatory SmartLinks:19316}  Review of Systems {ros; complete:30496}    Objective:   LMP 04/07/2008  VS reviewed, nursing note reviewed,  Constitutional: well developed, well nourished, no distress HEENT: normocephalic CV: normal rate Pulm/chest wall: normal effort Breast Exam:  ***Deferred with low risks and shared decision making, discussed recommendation to start mammogram between 40-50 yo/ exam performed: right breast normal without mass, skin or nipple changes or axillary nodes, left breast normal without mass, skin or nipple changes or axillary nodes Abdomen: soft Neuro: alert and oriented x  3 Skin: warm, dry Psych: affect normal Pelvic exam: ***Deferred/ Performed: Cervix pink, visually closed, without lesion, scant white creamy discharge, vaginal walls and external genitalia normal Bimanual exam: Cervix 0/long/high, firm, anterior, neg CMT, uterus nontender, nonenlarged, adnexa without tenderness, enlargement, or mass       Assessment/Plan:   1. Pap smear abnormality of cervix/human papillomavirus (HPV) positive ***  2. Well woman exam with routine gynecological exam ***  3. Encounter for screening for cervical cancer ***  4. Screening mammogram for breast cancer --Mammogram scheduled 02/22/22     No follow-ups on file.   Fatima Blank, CNM 9:38 AM

## 2022-02-09 ENCOUNTER — Other Ambulatory Visit: Payer: Self-pay | Admitting: Nurse Practitioner

## 2022-02-17 ENCOUNTER — Ambulatory Visit (HOSPITAL_BASED_OUTPATIENT_CLINIC_OR_DEPARTMENT_OTHER)
Admission: RE | Admit: 2022-02-17 | Discharge: 2022-02-17 | Disposition: A | Discharge status: Home / Self Care | Payer: BC Managed Care – PPO | Source: Ambulatory Visit | Attending: Oncology | Admitting: Oncology

## 2022-02-17 ENCOUNTER — Ambulatory Visit
Admission: RE | Admit: 2022-02-17 | Discharge: 2022-02-17 | Disposition: A | Discharge status: Home / Self Care | Payer: BC Managed Care – PPO | Source: Ambulatory Visit | Attending: Obstetrics & Gynecology | Admitting: Obstetrics & Gynecology

## 2022-02-17 DIAGNOSIS — Z1231 Encounter for screening mammogram for malignant neoplasm of breast: Secondary | ICD-10-CM | POA: Diagnosis not present

## 2022-02-17 DIAGNOSIS — R918 Other nonspecific abnormal finding of lung field: Secondary | ICD-10-CM | POA: Diagnosis not present

## 2022-02-17 DIAGNOSIS — J439 Emphysema, unspecified: Secondary | ICD-10-CM | POA: Diagnosis not present

## 2022-02-17 DIAGNOSIS — C3490 Malignant neoplasm of unspecified part of unspecified bronchus or lung: Secondary | ICD-10-CM | POA: Insufficient documentation

## 2022-02-21 ENCOUNTER — Other Ambulatory Visit: Payer: Self-pay | Admitting: Obstetrics & Gynecology

## 2022-02-21 DIAGNOSIS — R928 Other abnormal and inconclusive findings on diagnostic imaging of breast: Secondary | ICD-10-CM

## 2022-02-22 ENCOUNTER — Inpatient Hospital Stay: Payer: BC Managed Care – PPO

## 2022-02-22 ENCOUNTER — Inpatient Hospital Stay: Payer: BC Managed Care – PPO | Attending: Oncology | Admitting: Oncology

## 2022-02-22 ENCOUNTER — Encounter: Payer: Self-pay | Admitting: *Deleted

## 2022-02-22 VITALS — BP 131/73 | HR 100 | Temp 98.1°F | Resp 18 | Ht 66.0 in | Wt 158.0 lb

## 2022-02-22 DIAGNOSIS — Z23 Encounter for immunization: Secondary | ICD-10-CM

## 2022-02-22 DIAGNOSIS — C3490 Malignant neoplasm of unspecified part of unspecified bronchus or lung: Secondary | ICD-10-CM

## 2022-02-22 DIAGNOSIS — J984 Other disorders of lung: Secondary | ICD-10-CM | POA: Diagnosis not present

## 2022-02-22 DIAGNOSIS — Z85118 Personal history of other malignant neoplasm of bronchus and lung: Secondary | ICD-10-CM | POA: Diagnosis not present

## 2022-02-22 MED ORDER — INFLUENZA VAC SPLIT QUAD 0.5 ML IM SUSY
0.5000 mL | PREFILLED_SYRINGE | Freq: Once | INTRAMUSCULAR | Status: AC
  Administered 2022-02-22: 0.5 mL via INTRAMUSCULAR
  Filled 2022-02-22: qty 0.5

## 2022-02-22 NOTE — Progress Notes (Signed)
[facility] OFFICE PROGRESS NOTE   Diagnosis: Non-small cell lung cancer  INTERVAL HISTORY:   Ms. Lins returns as scheduled.  She reports feeling very well over the past several months.  No dyspnea.  Good energy level.  She strained her back a few days ago.  This has occurred in the past.  Objective:  Vital signs in last 24 hours:  Blood pressure 131/73, pulse 100, temperature 98.1 F (36.7 C), temperature source Oral, resp. rate 18, height '5\' 6"'  (1.676 m), weight 158 lb (71.7 kg), last menstrual period 04/07/2008, SpO2 (!) 88 %.    HEENT: Neck without mass Lymphatics: No cervical, supraclavicular, axillary, or inguinal nodes Resp: End inspiratory rhonchi/Tory wheeze at the left posterior chest, no respiratory distress Cardio: Regular rate and rhythm GI: No hepatosplenomegaly Vascular: No leg edema   Lab Results:  Lab Results  Component Value Date   WBC 5.8 08/08/2021   HGB 14.0 08/08/2021   HCT 41.9 08/08/2021   MCV 102.4 (H) 08/08/2021   PLT 224 08/08/2021   NEUTROABS 3.9 08/08/2021    CMP  Lab Results  Component Value Date   NA 136 08/08/2021   K 4.0 08/08/2021   CL 94 (L) 08/08/2021   CO2 31 08/08/2021   GLUCOSE 117 (H) 08/08/2021   BUN 16 08/08/2021   CREATININE 0.98 08/08/2021   CALCIUM 9.5 08/08/2021   PROT 7.8 08/08/2021   ALBUMIN 3.6 08/08/2021   AST 23 08/08/2021   ALT 12 08/08/2021   ALKPHOS 68 08/08/2021   BILITOT 0.5 08/08/2021   GFRNONAA >60 08/08/2021   GFRAA >60 01/29/2020    Lab Results  Component Value Date   CEA 5.5 (H) 08/02/2012    Lab Results  Component Value Date   INR 1.04 06/07/2018   LABPROT 13.5 06/07/2018    Imaging:  No results found.  Medications: I have reviewed the patient's current medications.   Assessment/Plan: Anal cancer-left anal margin/anal canal mass, status post a biopsy on 08/09/2012 confirming invasive moderately differentiated squamous cell carcinoma,p16 positive.   -Staging  PET scan 08/20/2012-hypermetabolic anal mass, no hypermetabolic metastases   -Initiation of concurrent radiation with cycle 1 of mitomycin C./5-fluorouracil 09/02/2012.   -She completed cycle 2 5-fluorouracil/mitomycin C. beginning 10/01/2012.   -She completed radiation 10/10/2012.   2. Right breast cancer 2009, ER positive, PR positive, HER-2 positive. She is status post neoadjuvant AC x4 cycles followed by Taxol/Herceptin. She underwent a right lumpectomy and sentinel lymph node biopsy 09/22/2008 with no evidence of residual invasive carcinoma and 5 negative sentinel lymph nodes. She then completed right breast radiation. She began tamoxifen 12/15/2008 and completed adjuvant Herceptin 05/25/2009. The tamoxifen was discontinued and she was switched to Arimidex in July 2013.  Arimidex discontinued in mid July 2019. 3. History of enlargement of the right tonsil.   4. Nonspecific pulmonary nodules without hypermetabolic activity on the PET scan 08/20/2012. Chest CT 10/21/2012 with scattered pulmonary nodules including nodules that were not present in 2010. Annual surveillance was recommended. Follow-up chest CT 10/24/2013 with scattered groundglass densities again noted in both lungs mostly unchanged when compared to the previous study. A sub-solid right lower lobe nodule had increased central density. The size was unchanged.The nodule has been present since 2009. CT chest 06/13/2016-a sub-solid 9 mm right upper lobe nodule has enlarged compared to 2015, a right lower lobe irregular nodular density appears more well defined, new vague 5 mm groundglass left upper lobe nodule, left lower lobe 10 mm groundglass nodule is  more apparent, posterior left upper lobe circumscribed solid nodule measures 11 x 10 mm and is new PET scan 06/22/2016-malignant range activity involving the 11 mm left upper lobe nodule, no other hypermetabolic nodules, suspicious sub-solid right lower lobe nodule Wedge resection of a  hypermetabolic left upper lobe nodule on 07/05/2016-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 No EGFR, ALK, BRAF, RET, ERBB2, or ROS1 alteration CT chest 11/14/2016-status post left upper lobe wedge resection, stable right lung nodules Chest CT 05/02/2017-stable bilateral solid and groundglass nodules, stable mild mediastinal lymphadenopathy CT chest 12/13/2017- enlargement of right upper lobe nodule, other lung nodules and chest lymph node stable PET 01/09/2018- right upper lobe nodule and right lower lobe nodule have enlarged since 2018 and have low level metabolic activity increased size of an 8 mm right upper lobe nodule without increased metabolic activity, stable activity and a mildly enlarged lower paratracheal node, no evidence of metastatic disease in the abdomen or pelvis CT chest 04/22/2018- no new lung nodules, dominant right lung nodules slightly increased in size Bronchoscopy/EBUS 05/04/2019 with biopsy of level 7 node, biopsy of right lower lobe nodule, FNA of right lower lobe nodule-negative CT biopsy of right upper lobe nodule 06/07/2018- adenocarcinoma with lepidic and acinar patterns SBRT to the dominant right upper lobe nodule 07/23/2018-07/30/2018 CT 09/16/2018-slight increase in size of medial right upper lobe nodule-possibly secondary to interval radiation, other nodules are unchanged CT 12/25/2018- medial right upper lobe nodule has decreased in size slightly, multiple additional solid and groundglass nodules are stable CT 06/09/2019-enlarging right lower lobe nodule, enlarging subsolid nodule, new right upper lobe nodule SBRT to right lower lobe lung nodule, 3 fractions 07/08/2019-07/14/2019 CT 12/05/2019-dominant right lower lobe lesion stable to slightly decreased in size, progressive solid component associated with a dominant right upper lung nodule, mild  progression of a left upper lobe nodule, new subsolid anterior medial left upper lobe nodule Cycle 1 Alimta/carboplatin/pembrolizumab 02/04/2020 Cycle 2 Alimta/carboplatin/pembrolizumab 02/23/2020 Cycle 3 Alimta/carboplatin/pembrolizumab 03/15/2020 Cycle 4 Alimta/carboplatin/pembrolizumab 04/05/2020 (Alimta and carboplatin dose reduced secondary to anemia) CT chest 04/12/2020-decreased size of solid right lower lobe and right upper lobe nodules, groundglass nodules in the left lung unchanged Cycle 5 Alimta/pembrolizumab 05/03/2020 Cycle 6 Alimta/pembrolizumab 05/21/2020 Cycle 7 Alimta/pembrolizumab 06/14/2020 Cycle 8 Alimta/pembrolizumab 07/05/2020 CT chest 07/22/2020-stable nodules, no evidence of disease progression, unchanged multifocal groundglass nodules suspicious for multifocal adenocarcinoma. Cycle 9 Alimta/Pembrolizumab 07/26/2020 Cycle 10 Alimta/Pembrolizumab 08/16/2020 Cycle 11 Alimta/Pembrolizumab 09/06/2020 Cycle 12 Alimta/pembrolizumab 09/27/2020 Cycle 13 Alimta/pembrolizumab 10/18/2020 CT chest 11/12/2020-enlarging soft tissue density right upper lobe medially adjacent to the mediastinum measuring 2.7 x 2.1 cm, concerning for recurrent tumor.  Stable radiation changes involving the right upper lobe and right lower lobe.  Stable scattered small solid and subsolid pulmonary nodules bilaterally.  New right-sided pleural effusion.  Stable borderline enlarged mediastinal lymph nodes. Bronchoscopy 12/28/2020-right upper lobe and left lower lobe lesions biopsy-negative pathology PET 08/04/5186-CZY metabolic activity involving the right suprahilar consolidative process-favored benign, measured smaller, additional groundglass densities with very low-level metabolic activity, radiation change in the right lower lobe, resolution of right pleural effusion, no distant metastatic disease Cycle 14 Alimta/pembrolizumab 01/19/2021 Cycle 15 Alimta/pembrolizumab 02/07/2021 Cycle 16 of Alimta/pembrolizumab  02/28/2021 Cycle 17 Alimta/pembrolizumab 03/28/2021 CT chest 04/18/2021-increased areas of masslike density in the right upper lobe and right lower lobe with a new right lower lobe satellite nodule, other groundglass attenuation lesions are stable, slight decrease in size of chronic right  pleural effusion CT head 05/06/2021-no evidence of metastatic disease CT chest 05/20/2018-right upper lobe and right lower lobe masses are unchanged, groundglass nodules in the left lung are unchanged, no mediastinal adenopathy, resist measurements of target lesions given 05/30/2021 SWOG [id number]-sotorasib Sotorasib placed on hold 06/30/2021 CT chest 07/07/2021-no change in right suprahilar/paramediastinal density, stable right lower lobe nodularity with adjacent bandlike density, stable groundglass left lung nodules CT chest 10/27/2021-mild increase in density of the right infrahilar mass, no change in size, new 5 mm right upper lobe subpleural nodule, stable left lung nodules CT chest 02/17/2022-enlargement of small mediastinal nodes, stable posttreatment changes at the central right upper lobe, right lower lobe masslike density slightly larger, subpleural right upper lobe pulmonary nodule larger, slight enlargement of groundglass lesion in the left lower lobe 5.  Baker's cyst left leg, pain and swelling in the left calf, negative Doppler 01/24/2020 and 01/29/2020-evaluated by orthopedics and underwent aspiration of the cyst 01/29/2000, completed a course of Keflex 6.  Anemia secondary to chemotherapy, status post 2 units of packed red blood cells on 04/10/2020-improved 7.  Port-A-Cath placement 01/12/2021; Port-A-Cath removal 02/24/2021 due to infection, culture positive for MRSA, status post 2 courses of doxycycline, Bactrim DS started 03/28/2021, Bactrim DS resumed 04/26/2021 8.  Diarrhea beginning 06/21/2021-sotorasib discontinued 07/01/2021, resolved        Disposition: Gina Cervantes appears unchanged.  She is asymptomatic  from the non-small cell lung cancer.  I reviewed the CT findings and images with her.  The CTs are consistent with mild disease progression involving mediastinal lymph nodes and several lung lesions.  We discussed treatment options including continued observation, adabrasib, standard salvage systemic chemotherapy, and referral for clinical trial.  We discussed referring her for a follow-up appoint with Dr. Aniceto Boss. Ms. Panjwani does not want a second opinion at present.  She agrees to a restaging PET scan and return office visit in 2 weeks.  We we will discuss treatment options based on the PET findings.  Betsy Coder, MD  02/22/2022  10:27 AM

## 2022-03-01 ENCOUNTER — Ambulatory Visit
Admission: RE | Admit: 2022-03-01 | Discharge: 2022-03-01 | Disposition: A | Discharge status: Home / Self Care | Payer: BC Managed Care – PPO | Source: Ambulatory Visit | Attending: Obstetrics & Gynecology | Admitting: Obstetrics & Gynecology

## 2022-03-01 ENCOUNTER — Ambulatory Visit: Admission: RE | Admit: 2022-03-01 | Payer: BC Managed Care – PPO | Source: Ambulatory Visit

## 2022-03-01 DIAGNOSIS — R928 Other abnormal and inconclusive findings on diagnostic imaging of breast: Secondary | ICD-10-CM

## 2022-03-01 DIAGNOSIS — R92321 Mammographic fibroglandular density, right breast: Secondary | ICD-10-CM | POA: Diagnosis not present

## 2022-03-02 ENCOUNTER — Encounter (HOSPITAL_COMMUNITY)
Admission: RE | Admit: 2022-03-02 | Discharge: 2022-03-02 | Disposition: A | Discharge status: Home / Self Care | Payer: BC Managed Care – PPO | Source: Ambulatory Visit | Attending: Oncology | Admitting: Oncology

## 2022-03-02 DIAGNOSIS — I700 Atherosclerosis of aorta: Secondary | ICD-10-CM | POA: Diagnosis not present

## 2022-03-02 DIAGNOSIS — C3490 Malignant neoplasm of unspecified part of unspecified bronchus or lung: Secondary | ICD-10-CM | POA: Diagnosis not present

## 2022-03-02 DIAGNOSIS — D259 Leiomyoma of uterus, unspecified: Secondary | ICD-10-CM | POA: Diagnosis not present

## 2022-03-02 DIAGNOSIS — R918 Other nonspecific abnormal finding of lung field: Secondary | ICD-10-CM | POA: Insufficient documentation

## 2022-03-02 LAB — GLUCOSE, CAPILLARY: Glucose-Capillary: 95 mg/dL (ref 70–99)

## 2022-03-02 MED ORDER — FLUDEOXYGLUCOSE F - 18 (FDG) INJECTION
7.8000 | Freq: Once | INTRAVENOUS | Status: AC
  Administered 2022-03-02: 7.95 via INTRAVENOUS

## 2022-03-03 ENCOUNTER — Inpatient Hospital Stay (HOSPITAL_BASED_OUTPATIENT_CLINIC_OR_DEPARTMENT_OTHER): Payer: BC Managed Care – PPO | Admitting: Oncology

## 2022-03-03 VITALS — BP 112/62 | HR 89 | Temp 98.2°F | Resp 18 | Ht 66.0 in | Wt 158.8 lb

## 2022-03-03 DIAGNOSIS — C3490 Malignant neoplasm of unspecified part of unspecified bronchus or lung: Secondary | ICD-10-CM

## 2022-03-03 DIAGNOSIS — Z85118 Personal history of other malignant neoplasm of bronchus and lung: Secondary | ICD-10-CM | POA: Diagnosis not present

## 2022-03-03 DIAGNOSIS — Z23 Encounter for immunization: Secondary | ICD-10-CM | POA: Diagnosis not present

## 2022-03-03 DIAGNOSIS — J984 Other disorders of lung: Secondary | ICD-10-CM | POA: Diagnosis not present

## 2022-03-03 NOTE — Progress Notes (Signed)
[place] OFFICE PROGRESS NOTE   Diagnosis: Non-small cell lung cancer  INTERVAL HISTORY:   Gina Cervantes returns as scheduled.  She feels well.  No complaint.  Objective:  Vital signs in last 24 hours:  Blood pressure 112/62, pulse 89, temperature 98.2 F (36.8 C), temperature source Oral, resp. rate 18, height _0  (1.676 m), weight 158 lb 12.8 oz (72 kg), last menstrual period 04/07/2008, SpO2 91 %.   Physical examination-not performed today  Lab Results:  Lab Results  Component Value Date   WBC 5.8 08/08/2021   HGB 14.0 08/08/2021   HCT 41.9 08/08/2021   MCV 102.4 (H) 08/08/2021   PLT 224 08/08/2021   NEUTROABS 3.9 08/08/2021    CMP  Lab Results  Component Value Date   NA 136 08/08/2021   K 4.0 08/08/2021   CL 94 (L) 08/08/2021   CO2 31 08/08/2021   GLUCOSE 117 (H) 08/08/2021   BUN 16 08/08/2021   CREATININE 0.98 08/08/2021   CALCIUM 9.5 08/08/2021   PROT 7.8 08/08/2021   ALBUMIN 3.6 08/08/2021   AST 23 08/08/2021   ALT 12 08/08/2021   ALKPHOS 68 08/08/2021   BILITOT 0.5 08/08/2021   GFRNONAA >60 08/08/2021   GFRAA >60 01/29/2020    Lab Results  Component Value Date   CEA 5.5 (H) 08/02/2012    Lab Results  Component Value Date   INR 1.04 06/07/2018   LABPROT 13.5 06/07/2018    Imaging:  NM PET Image Restag (PS) Skull Base To Thigh  Result Date: 03/02/2022 CLINICAL DATA:  Subsequent treatment strategy for non-small cell lung cancer. EXAM: NUCLEAR MEDICINE PET SKULL BASE TO THIGH TECHNIQUE: 7.95 mCi F-18 FDG was injected intravenously. Full-ring PET imaging was performed from the skull base to thigh after the radiotracer. CT data was obtained and used for attenuation correction and anatomic localization. Fasting blood glucose: 95 mg/dl COMPARISON:  CT chest 02/17/2022 FINDINGS: Mediastinal blood pool activity: SUV max 2.47 Liver activity: SUV max NA NECK: No tracer avid mass or adenopathy. Incidental CT findings: None. CHEST: Index  right paratracheal lymph node measures 1 cm and has an SUV max of 3.74, image 61/4. On the comparison PET-CT this had an SUV max of 2.78. Subcarinal lymph node measures 1.2 cm with SUV max of 3.3, image 73/4. On the previous PET-CT SUV max was equal to 2.17 Right upper lobe treated tumor is mildly FDG avid measuring 2.7 by 2.1 cm with SUV max of 3.67, image 20/4. On the previous PET-CT the SUV max was equal to 2.5. The subpleural nodule overlying the right upper lobe measures 1 cm and has an SUV max of 13.23, image 35/7. There is mild pleural thickening overlying the right lung which exhibits progressive increased tracer uptake with SUV max of 2.8. Formally 1.09 . Non-solid nodule within the left upper lobe is again noted measuring 1.2 cm with SUV max of 1.67. On PET-CT from 12/29/2020 this had an SUV max of 0.65. Non solid nodule within the left lower lobe measures 1.4 cm with SUV max of 0.86, image 43/7. On the previous PET-CT this measured the same. Small loculated pleural fluid collection with overlying pleural thickening is identified at the right base. The corresponding SUV max within this area is equal to 2.93 Incidental CT findings: Aortic atherosclerosis and coronary artery calcifications. ABDOMEN/PELVIS: No abnormal hypermetabolic activity within the liver, pancreas, adrenal glands, or spleen. No hypermetabolic lymph nodes in the abdomen or pelvis. Incidental CT findings: Aortic atherosclerosis.  Nonobstructing left renal calculus. Small calcified uterine fibroids. SKELETON: No focal hypermetabolic activity to suggest skeletal metastasis. Incidental CT findings: None. IMPRESSION: 1. The most worrisome finding on today's study is a subpleural nodule overlying the right upper lobe which is intensely FDG avid and is concerning for tracer avid tumor. 2. Mild tracer uptake is associated with the treated tumor within the right upper lobe and right lower lobe which may reflect post treatment changes although  recurrent tumor would be difficult to exclude with a high degree of certainty. 3. Mild increased tracer uptake associated with right paratracheal and subcarinal lymph nodes. This is nonspecific and may be reactive in the setting of chronic inflammation or infection. Metastatic disease cannot be excluded. 4. Chronic appearing loculated pleural fluid collection overlying the posterior right lower lobe with adjacent pleural thickening is new when compared with previous PET-CT from 12/29/2020. Mild tracer uptake is associated with the pleural thickening which is nonspecific and may reflect underlying inflammation or infection. In light of the FDG avid subpleural nodule overlying the right upper underlying pleural metastasis cannot be excluded. 5. There are 2 subsolid nodules within the left lung which exhibit mild increased tracer uptake. These are nonspecific and may reflect areas of inflammation or infection. Low-grade indolent pulmonary adenocarcinoma cannot be excluded. 6. No signs of metastatic disease to the abdomen or pelvis. 7.  Aortic Atherosclerosis (ICD10-I70.0). Electronically Signed   By: Kerby Moors M.D.   On: 03/02/2022 09:10   MM DIAG BREAST TOMO UNI RIGHT  Result Date: 03/01/2022 CLINICAL DATA:  Patient returns today to evaluate a possible RIGHT breast asymmetry questioned on recent screening mammogram. EXAM: DIGITAL DIAGNOSTIC UNILATERAL RIGHT MAMMOGRAM WITH TOMOSYNTHESIS TECHNIQUE: Right digital diagnostic mammography and breast tomosynthesis was performed. COMPARISON:  Previous exams including recent screening mammogram dated 02/17/2022, chest CT dated 02/17/2022, earlier screening mammogram dated 02/16/2021 and image guided Port-A-Cath insertion dated 01/12/2021. ACR Breast Density Category b: There are scattered areas of fibroglandular density. FINDINGS: Additional diagnostic views were obtained today with scar marker placed on the skin at the site of a recent Port-A-Cath insertion/removal  that was complicated by infection/abscess. The questioned asymmetry on recent screening mammogram within the upper inner quadrant of the RIGHT breast, at posterior depth, is confirmed to be related to the site of this Port-A-Cath insertion/removal and is compatible with associated benign scarring/fibrosis. Similar corresponding findings demonstrated on chest CT of 02/17/2022. There are also stable post lumpectomy changes within the anterior RIGHT breast. No new dominant masses, suspicious calcifications or secondary signs of malignancy are identified elsewhere within the RIGHT breast. IMPRESSION: 1. No evidence of malignancy. 2. Benign scarring/fibrosis within the upper inner quadrant of the RIGHT breast, at posterior depth, corresponding to patient's history of Port-A-Cath insertion/removal complicated by infection/abscess. 3. Stable post lumpectomy changes within the anterior RIGHT breast. Patient may return to routine annual bilateral screening mammogram schedule. RECOMMENDATION: Screening mammogram in one year.(Code:[id number]-B-01Y) I have discussed the findings and recommendations with the patient. If applicable, a reminder letter will be sent to the patient regarding the next appointment. BI-RADS CATEGORY  2: Benign. Electronically Signed   By: Franki Cabot M.D.   On: 03/01/2022 15:03   Medications: I have reviewed the patient's current medications.   Assessment/Plan:  Anal cancer-left anal margin/anal canal mass, status post a biopsy on 08/09/2012 confirming invasive moderately differentiated squamous cell carcinoma,p16 positive.   -Staging PET scan 08/20/2012-hypermetabolic anal mass, no hypermetabolic metastases   -Initiation of concurrent radiation with cycle 1 of mitomycin C./5-fluorouracil  09/02/2012.   -She completed cycle 2 5-fluorouracil/mitomycin C. beginning 10/01/2012.   -She completed radiation 10/10/2012.   2. Right breast cancer 2009, ER positive, PR positive, HER-2 positive. She is status  post neoadjuvant AC x4 cycles followed by Taxol/Herceptin. She underwent a right lumpectomy and sentinel lymph node biopsy 09/22/2008 with no evidence of residual invasive carcinoma and 5 negative sentinel lymph nodes. She then completed right breast radiation. She began tamoxifen 12/15/2008 and completed adjuvant Herceptin 05/25/2009. The tamoxifen was discontinued and she was switched to Arimidex in July 2013.  Arimidex discontinued in mid July 2019. 3. History of enlargement of the right tonsil.   4. Nonspecific pulmonary nodules without hypermetabolic activity on the PET scan 08/20/2012. Chest CT 10/21/2012 with scattered pulmonary nodules including nodules that were not present in 2010. Annual surveillance was recommended. Follow-up chest CT 10/24/2013 with scattered groundglass densities again noted in both lungs mostly unchanged when compared to the previous study. A sub-solid right lower lobe nodule had increased central density. The size was unchanged.The nodule has been present since 2009. 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 No EGFR, ALK, BRAF, RET, ERBB2, or ROS1 alteration CT chest 11/14/2016-status post left upper lobe wedge resection, stable right lung nodules Chest CT  05/02/2017-stable bilateral solid and groundglass nodules, stable mild mediastinal lymphadenopathy CT chest 12/13/2017- enlargement of right upper lobe nodule, other lung nodules and chest lymph node stable PET 01/09/2018- right upper lobe nodule and right lower lobe nodule have enlarged since 2018 and have low level metabolic activity increased size of an 8 mm right upper lobe nodule without increased metabolic activity, stable activity and a mildly enlarged lower paratracheal node, no evidence of metastatic disease in the abdomen or pelvis CT chest 04/22/2018- no new lung nodules, dominant right lung nodules slightly increased in size Bronchoscopy/EBUS 05/04/2019 with biopsy of level 7 node, biopsy of right lower lobe nodule, FNA of right lower lobe nodule-negative CT biopsy of right upper lobe nodule 06/07/2018- adenocarcinoma with lepidic and acinar patterns SBRT to the dominant right upper lobe nodule 07/23/2018-07/30/2018 CT 09/16/2018-slight increase in size of medial right upper lobe nodule-possibly secondary to interval radiation, other nodules are unchanged CT 12/25/2018- medial right upper lobe nodule has decreased in size slightly, multiple additional solid and groundglass nodules are stable CT 06/09/2019-enlarging right lower lobe nodule, enlarging subsolid nodule, new right upper lobe nodule SBRT to right lower lobe lung nodule, 3 fractions 07/08/2019-07/14/2019 CT 12/05/2019-dominant right lower lobe lesion stable to slightly decreased in size, progressive solid component associated with a dominant right upper lung nodule, mild progression of a left upper lobe nodule, new subsolid anterior medial left upper lobe nodule Cycle 1 Alimta/carboplatin/pembrolizumab 02/04/2020 Cycle 2 Alimta/carboplatin/pembrolizumab 02/23/2020 Cycle 3 Alimta/carboplatin/pembrolizumab 03/15/2020 Cycle 4 Alimta/carboplatin/pembrolizumab 04/05/2020 (Alimta and carboplatin dose reduced secondary to anemia) CT chest  04/12/2020-decreased size of solid right lower lobe  and right upper lobe nodules, groundglass nodules in the left lung unchanged Cycle 5 Alimta/pembrolizumab 05/03/2020 Cycle 6 Alimta/pembrolizumab 05/21/2020 Cycle 7 Alimta/pembrolizumab 06/14/2020 Cycle 8 Alimta/pembrolizumab 07/05/2020 CT chest 07/22/2020-stable nodules, no evidence of disease progression, unchanged multifocal groundglass nodules suspicious for multifocal adenocarcinoma. Cycle 9 Alimta/Pembrolizumab 07/26/2020 Cycle 10 Alimta/Pembrolizumab 08/16/2020 Cycle 11 Alimta/Pembrolizumab 09/06/2020 Cycle 12 Alimta/pembrolizumab 09/27/2020 Cycle 13 Alimta/pembrolizumab 10/18/2020 CT chest 11/12/2020-enlarging soft tissue density right upper lobe medially adjacent to the mediastinum measuring 2.7 x 2.1 cm, concerning for recurrent tumor.  Stable radiation changes involving the right upper lobe and right lower lobe.  Stable scattered small solid and subsolid pulmonary nodules bilaterally.  New right-sided pleural effusion.  Stable borderline enlarged mediastinal lymph nodes. Bronchoscopy 12/28/2020-right upper lobe and left lower lobe lesions biopsy-negative pathology PET 4/40/3474-[id number] metabolic activity involving the right suprahilar consolidative process-favored benign, measured smaller, additional groundglass densities with very low-level metabolic activity, radiation change in the right lower lobe, resolution of right pleural effusion, no distant metastatic disease Cycle 14 Alimta/pembrolizumab 01/19/2021 Cycle 15 Alimta/pembrolizumab 02/07/2021 Cycle 16 of Alimta/pembrolizumab 02/28/2021 Cycle 17 Alimta/pembrolizumab 03/28/2021 CT chest 04/18/2021-increased areas of masslike density in the right upper lobe and right lower lobe with a new right lower lobe satellite nodule, other groundglass attenuation lesions are stable, slight decrease in size of chronic right pleural effusion CT head 05/06/2021-no evidence of metastatic disease CT chest  05/20/2018-right upper lobe and right lower lobe masses are unchanged, groundglass nodules in the left lung are unchanged, no mediastinal adenopathy, resist measurements of target lesions given 05/30/2021 SWOG [id number]-[id number] Sotorasib placed on hold 06/30/2021 CT chest 07/07/2021-no change in right suprahilar/paramediastinal density, stable right lower lobe nodularity with adjacent bandlike density, stable groundglass left lung nodules CT chest 10/27/2021-mild increase in density of the right infrahilar mass, no change in size, new 5 mm right upper lobe subpleural nodule, stable left lung nodules CT chest 02/17/2022-enlargement of small mediastinal nodes, stable posttreatment changes at the central right upper lobe, right lower lobe masslike density slightly larger, subpleural right upper lobe pulmonary nodule larger, slight enlargement of groundglass lesion in the left lower lobe PET 03/02/2022-hypermetabolic right upper lobe subpleural nodule, mild tracer activity in treated tumor in the right upper lobe and right lower lobe-posttreatment change?,  Mild increased activity associated with right paratracheal and subcarinal nodes, chronic loculated right pleural effusion with mild tracer uptake, subsolid left lung nodules with mild tracer uptake, no evidence of metastatic disease to the abdomen or pelvis 5.  Baker's cyst left leg, pain and swelling in the left calf, negative Doppler 01/24/2020 and 01/29/2020-evaluated by orthopedics and underwent aspiration of the cyst 01/29/2000, completed a course of Keflex 6.  Anemia secondary to chemotherapy, status post 2 units of packed red blood cells on 04/10/2020-improved 7.  Port-A-Cath placement 01/12/2021; Port-A-Cath removal 02/24/2021 due to infection, culture positive for MRSA, status post 2 courses of doxycycline, Bactrim DS started 03/28/2021, Bactrim DS resumed 04/26/2021 8.  Diarrhea beginning 06/21/2021-sotorasib discontinued 07/01/2021, resolved        Disposition: Gina Cervantes has non-small cell lung cancer. I reviewed the PET findings and images with her.  There is 1 intensely hypermetabolic pleural nodule in the right chest.  Other right-sided lung lesions, left-sided lung lesions, and mediastinal nodes have low-level uptake just above or below mediastinal blood pool.  There is a mildly hypermetabolic tiny right pleural effusion.  No evidence of distant metastatic disease.  We discussed treatment options including observation, resection of the right pleural nodule, radiation to the right pleural  nodule, and systemic therapy.  We discussed treatment with adagrasib.  She is not comfortable with observation.  She would like to consider radiation to the hypermetabolic pleural-based nodule.  I will contact Dr. Lisbeth Renshaw.  We will plan for a restaging CT in 3 months.  Betsy Coder, MD  03/03/2022  2:29 PM

## 2022-03-13 NOTE — Progress Notes (Signed)
Radiation Oncology         [phone] ________________________________  Name: Gina Cervantes        MRN: [id number]  Date of Service: 03/14/2022 DOB: 1961/01/21  SW:NIOEVOJJ, Gina Price, MD  Ladell Pier, MD     REFERRING PHYSICIAN: Ladell Pier, MD   DIAGNOSIS: There were no encounter diagnoses.   HISTORY OF PRESENT ILLNESS: Gina Cervantes is a 61 y.o. female who is known to our service with multiple cancer histories. She has a history of early stage breast cancer treated with lumpectomy, adjuvant radiotherapy, and antiestrogen therapy, as well as a history of anal cancer treated with chemoRT. She has been NED from those diseases, but was found in 2018 to have a nodule in the LUL of the lung. She underwent wedge resection of this on 07/05/16 revealing a Stage IA2, pT1bN0 NSCLC, adenosquamous histology.   She developed a progressively enlarging right upper lobe nodules and bronchoscopy was performed on 05/03/18 and the specimens were negative for disease. A CT biopsy on 06/07/18 revealed adenocarcinoma consistent with lung primary. She proceeded with SBRT to the RUL nodules, one of which was the biopsied nodule. She has been followed with routine surveillance imaging and a RLL nodule has been followed, and it has enlarged since her CT in August 2020 when it had been 2.6 x 1.2 x .9 cm, and on 06/10/19, the nodule was 2.6 x 1.4 x 1.8 cm. She had little change in the RUL nodules, and stigmata of several other nodules that will be followed in the RUL, and LLL. Given her history, her case was discussed in thoracic oncology conference.  And following this she received stereotactic body radiotherapy to the right lower lobe nodule.  She has remained under the surveillance of Dr. Benay Spice.  Recent imaging on 02/07/2022 of the chest without contrast showed stable posttreatment changes in the right upper lobe enlarging mediastinal lymph nodes, interval enlargement of the right lower lobe posttreatment  area suspicious for disease, and right upper lobe lateral nodule enlarging and concerning.  She underwent a PET scan on 03/02/2022 for further clarification.  The subpleural nodule in the right upper lobe was intensely FDG uptake was also seen in the right upper lobe and right lower lobe reflecting posttreatment changes although recurrent tumor could not be fully excluded.  There was right paratracheal and subcarinal adenopathy with mild uptake favored to be reactive but metastatic disease cannot be further excluded and negative.  Fluid collection with pleural thickening was new and mild tracer uptake was noted in that location as well.  Discussion of observation resection or radiotherapy was had with the patient and she stomach therapy.  She was not comfortable with observation and was interested in discussing possible radiotherapy to the hypermetabolic pleural-based nodule.  She is seen today to discuss treatment options to this location.  PREVIOUS RADIATION THERAPY: Yes   07/08/19-07/14/19 SBRT Treatment: The right lung target in the RLL was treated to 54 Gy in 3 fractions.  07/23/2018-07/30/2018 SBRT Treatment: The dominant tumor in the RUL lung was treated to 54 Gy in 3 fractions.  09/02/2012 through 10/10/2012: The patient was treated to the anal region and high risk region. This was carried out using a IMRT technique with daily image guidance.  The patient received a total of 50.4 gray at 1.8 gray per fraction. The patient was initially treated using daily image guidance on a standard linear accelerator, and then she was treated on tomotherapy after the first  week of treatment.    2010:  Adjuvant whole breast radiation to the right breast with Dr. Beola Cord  PAST MEDICAL HISTORY:  Past Medical History:  Diagnosis Date   Anal cancer ([place]) 08/09/2012   Squamous cell   Anxiety    PANIC ATTACKS   Arthritis    Breast cancer ([place]) 2009   ER+PR+HER-2+   Ductal carcinoma ([place]) 02/2008   invasive     Heart palpitations    History of radiation therapy 09/02/12-10/10/12   anal 50.4Gy   History of shingles 10/2014   HPV (human papilloma virus) anogenital infection    vulvar/ freezing hx   Hx of radiation therapy 2020   Lung nodule    Malignant neoplasm of upper lobe of left lung ([place]) 06/2016   Personal history of chemotherapy    Personal history of radiation therapy 2010   Pneumonia    NO RECENT PROBLEMS   PONV (postoperative nausea and vomiting)    Hypotension   Radiation 10/21/08-12/08/08   Right breast 6240 cGy   Shingles 2017   Status post chemotherapy 02/2008   Taxol/Herceptin       PAST SURGICAL HISTORY: Past Surgical History:  Procedure Laterality Date   BREAST BIOPSY Right 02/04/2008   malignant   BREAST LUMPECTOMY Right 2010   malignant   BREAST LUMPECTOMY WITH AXILLARY LYMPH NODE BIOPSY Right 5/10   Dr. Marlou Starks   BRONCHIAL BIOPSY  12/28/2020   Procedure: BRONCHIAL BIOPSIES;  Surgeon: Garner Nash, DO;  Location: [facility];  Service: Pulmonary;;   BRONCHIAL BRUSHINGS  12/28/2020   Procedure: BRONCHIAL BRUSHINGS;  Surgeon: Garner Nash, DO;  Location: [facility];  Service: Pulmonary;;   BRONCHIAL NEEDLE ASPIRATION BIOPSY  12/28/2020   Procedure: BRONCHIAL NEEDLE ASPIRATION BIOPSIES;  Surgeon: Garner Nash, DO;  Location: [facility];  Service: Pulmonary;;   BRONCHIAL WASHINGS  12/28/2020   Procedure: BRONCHIAL WASHINGS;  Surgeon: Garner Nash, DO;  Location: [facility];  Service: Pulmonary;;   CESAREAN SECTION     COLONOSCOPY W/ POLYPECTOMY     ENDOBRONCHIAL ULTRASOUND  12/28/2020   Procedure: ENDOBRONCHIAL ULTRASOUND;  Surgeon: Garner Nash, DO;  Location: [facility];  Service: Pulmonary;;   EVALUATION UNDER ANESTHESIA WITH ANAL FISTULECTOMY N/A 08/09/2012   Procedure: EXAM UNDER ANESTHESIA AND BIOPSY OF ANAL MASS;  Surgeon: Odis Hollingshead, MD;  Location: [facility];  Service: General;  Laterality: N/A;   IR IMAGING GUIDED PORT INSERTION   01/12/2021   IR RADIOLOGIST EVAL & MGMT  03/11/2021   IR REMOVAL TUN ACCESS W/ PORT W/O FL MOD SED  02/24/2021   KNEE ARTHROSCOPY Left    left knee   LUNG LOBECTOMY Left    PORTACATH PLACEMENT  03/2008   dr. Marlou Starks    REMOVAL PORTACATH  2011   VIDEO ASSISTED THORACOSCOPY (VATS)/WEDGE RESECTION Left 07/05/2016   Procedure: VIDEO ASSISTED THORACOSCOPY (VATS)/WEDGE RESECTION left upper lobe,  lymph node dissection and placement of OnQ catheter;  Surgeon: [name], MD;  Location: [facility];  Service: Thoracic;  Laterality: Left;   VIDEO BRONCHOSCOPY N/A 07/05/2016   Procedure: VIDEO BRONCHOSCOPY;  Surgeon: [name], MD;  Location: [facility];  Service: Thoracic;  Laterality: N/A;   VIDEO BRONCHOSCOPY N/A 05/03/2018   Procedure: VIDEO BRONCHOSCOPY;  Surgeon: [name], MD;  Location: [facility] [facility];  Service: Thoracic;  Laterality: N/A;   VIDEO BRONCHOSCOPY WITH ENDOBRONCHIAL NAVIGATION N/A 05/03/2018   Procedure: VIDEO BRONCHOSCOPY WITH ENDOBRONCHIAL NAVIGATION WITH BIOPSY;  Surgeon: Servando Snare,  Lilia Argue, MD;  Location: [place];  Service: Thoracic;  Laterality: N/A;   VIDEO BRONCHOSCOPY WITH ENDOBRONCHIAL NAVIGATION Bilateral 12/28/2020   Procedure: VIDEO BRONCHOSCOPY WITH ENDOBRONCHIAL NAVIGATION;  Surgeon: Garner Nash, DO;  Location: [place];  Service: Pulmonary;  Laterality: Bilateral;  ION   VIDEO BRONCHOSCOPY WITH ENDOBRONCHIAL ULTRASOUND N/A 05/03/2018   Procedure: VIDEO BRONCHOSCOPY WITH ENDOBRONCHIAL ULTRASOUND;  Surgeon: [name], MD;  Location: [facility];  Service: Thoracic;  Laterality: N/A;   VIDEO BRONCHOSCOPY WITH RADIAL ENDOBRONCHIAL ULTRASOUND  12/28/2020   Procedure: RADIAL ENDOBRONCHIAL ULTRASOUND;  Surgeon: Garner Nash, DO;  Location: [facility];  Service: Pulmonary;;     FAMILY HISTORY:  Family History  Problem Relation Age of Onset   Lung cancer Father    Stroke Mother    Breast cancer Neg Hx      SOCIAL HISTORY:  reports that she quit smoking  about 8 years ago. Her smoking use included cigarettes. She has never used smokeless tobacco. She reports that she does not currently use alcohol. She reports that she does not use drugs. The patient is married and lives in [place]. She is a [employer].   ALLERGIES: No known allergies   MEDICATIONS:  Current Outpatient Medications  Medication Sig Dispense Refill   albuterol (VENTOLIN HFA) 108 (90 Base) MCG/ACT inhaler INHALE TWO PUFFS BY MOUTH EVERY 6 HOURS AS NEEDED FOR WHEEZE OR SHORTNESS OF BREATH 8.5 g 2   BREZTRI AEROSPHERE 160-9-4.8 MCG/ACT AERO INHALE TWO PUFFS BY MOUTH TWICE A DAY IN THE MORNING AND AT BEDTIME 10.7 g 3   CALCIUM PO Take 1,200 mg by mouth daily. D3 (Patient not taking: Reported on 07/08/2021)     COVID-19 mRNA bivalent vaccine, Pfizer, (PFIZER COVID-19 VAC BIVALENT) injection Inject into the muscle. 0.3 mL 0   Cyanocobalamin (VITAMIN B-12 PO) Take 2,500 mcg by mouth daily. Gummies-does not know dose (Patient not taking: Reported on 07/08/2021)     loratadine (CLARITIN) 10 MG tablet Take 10 mg by mouth as needed for allergies. (Patient not taking: Reported on 10/31/2021)     LORazepam (ATIVAN) 0.5 MG tablet Take 1 tablet (0.5 mg total) by mouth every 8 (eight) hours as needed for anxiety (or nausea). 60 tablet 0   magnesium oxide (MAG-OX) 400 MG tablet Take 600 mg by mouth daily. (Patient not taking: Reported on 10/31/2021)     Multiple Vitamins-Minerals (MULTIVITAMIN WITH MINERALS) tablet Take 1 tablet by mouth daily. (Patient not taking: Reported on 07/08/2021)     POTASSIUM CHLORIDE PO Take 550 mg by mouth daily. (Patient not taking: Reported on 09/06/2021)     No current facility-administered medications for this visit.     REVIEW OF SYSTEMS: On review of systems, the patient reports that she is doing well overall. She reports she's used a rescue inhaler a few more times lately than she usually would. That being said she denies routinely feeling short of breath, or with any  cough. She continues to have hip pains that have been attributed to postradiotherapy myalgais. She denies any chest pain,  fevers, chills, night sweats, unintended weight changes. She has had intentional weight loss however. She denies any bowel or bladder disturbances, and denies abdominal pain, nausea or vomiting. She denies any new musculoskeletal or joint aches or pains, new skin lesions or concerns. A complete review of systems is obtained and is otherwise negative.     PHYSICAL EXAM:  Wt Readings from Last 3 Encounters:  03/03/22 158 lb 12.8 oz (  72 kg)  02/22/22 158 lb (71.7 kg)  11/29/21 158 lb 6.4 oz (71.8 kg)   Temp Readings from Last 3 Encounters:  03/03/22 98.2 F (36.8 C) (Oral)  02/22/22 98.1 F (36.7 C) (Oral)  11/29/21 98.4 F (36.9 C) (Oral)   BP Readings from Last 3 Encounters:  03/03/22 112/62  02/22/22 131/73  11/29/21 120/62   Pulse Readings from Last 3 Encounters:  03/03/22 89  02/22/22 100  11/29/21 (!) 112   *** In general this is a well appearing caucasian female in no acute distress. She's alert and oriented x4 and appropriate throughout the examination. Cardiopulmonary assessment is negative for acute distress and she exhibits normal effort.    ECOG = ***  0 - Asymptomatic (Fully active, able to carry on all predisease activities without restriction)  1 - Symptomatic but completely ambulatory (Restricted in physically strenuous activity but ambulatory and able to carry out work of a light or sedentary nature. For example, light housework, office work)  2 - Symptomatic, <50% in bed during the day (Ambulatory and capable of all self care but unable to carry out any work activities. Up and about more than 50% of waking hours)  3 - Symptomatic, >50% in bed, but not bedbound (Capable of only limited self-care, confined to bed or chair 50% or more of waking hours)  4 - Bedbound (Completely disabled. Cannot carry on any self-care. Totally confined to bed or  chair)  5 - Death   Eustace Pen MM, Creech RH, Tormey DC, et al. [phone]). "Toxicity and response criteria of the [facility]". [place] Oncol. 5 (6): 649-55    LABORATORY DATA:  Lab Results  Component Value Date   WBC 5.8 08/08/2021   HGB 14.0 08/08/2021   HCT 41.9 08/08/2021   MCV 102.4 (H) 08/08/2021   PLT 224 08/08/2021   Lab Results  Component Value Date   NA 136 08/08/2021   K 4.0 08/08/2021   CL 94 (L) 08/08/2021   CO2 31 08/08/2021   Lab Results  Component Value Date   ALT 12 08/08/2021   AST 23 08/08/2021   ALKPHOS 68 08/08/2021   BILITOT 0.5 08/08/2021      RADIOGRAPHY: NM PET Image Restag (PS) Skull Base To Thigh  Result Date: 03/02/2022 CLINICAL DATA:  Subsequent treatment strategy for non-small cell lung cancer. EXAM: NUCLEAR MEDICINE PET SKULL BASE TO THIGH TECHNIQUE: 7.95 mCi F-18 FDG was injected intravenously. Full-ring PET imaging was performed from the skull base to thigh after the radiotracer. CT data was obtained and used for attenuation correction and anatomic localization. Fasting blood glucose: 95 mg/dl COMPARISON:  CT chest 02/17/2022 FINDINGS: Mediastinal blood pool activity: SUV max 2.47 Liver activity: SUV max NA NECK: No tracer avid mass or adenopathy. Incidental CT findings: None. CHEST: Index right paratracheal lymph node measures 1 cm and has an SUV max of 3.74, image 61/4. On the comparison PET-CT this had an SUV max of 2.78. Subcarinal lymph node measures 1.2 cm with SUV max of 3.3, image 73/4. On the previous PET-CT SUV max was equal to 2.17 Right upper lobe treated tumor is mildly FDG avid measuring 2.7 by 2.1 cm with SUV max of 3.67, image 20/4. On the previous PET-CT the SUV max was equal to 2.5. The subpleural nodule overlying the right upper lobe measures 1 cm and has an SUV max of 13.23, image 35/7. There is mild pleural thickening overlying the right lung which exhibits progressive increased tracer  uptake with SUV max of  2.8. Formally 1.09 . Non-solid nodule within the left upper lobe is again noted measuring 1.2 cm with SUV max of 1.67. On PET-CT from 12/29/2020 this had an SUV max of 0.65. Non solid nodule within the left lower lobe measures 1.4 cm with SUV max of 0.86, image 43/7. On the previous PET-CT this measured the same. Small loculated pleural fluid collection with overlying pleural thickening is identified at the right base. The corresponding SUV max within this area is equal to 2.93 Incidental CT findings: Aortic atherosclerosis and coronary artery calcifications. ABDOMEN/PELVIS: No abnormal hypermetabolic activity within the liver, pancreas, adrenal glands, or spleen. No hypermetabolic lymph nodes in the abdomen or pelvis. Incidental CT findings: Aortic atherosclerosis. Nonobstructing left renal calculus. Small calcified uterine fibroids. SKELETON: No focal hypermetabolic activity to suggest skeletal metastasis. Incidental CT findings: None. IMPRESSION: 1. The most worrisome finding on today's study is a subpleural nodule overlying the right upper lobe which is intensely FDG avid and is concerning for tracer avid tumor. 2. Mild tracer uptake is associated with the treated tumor within the right upper lobe and right lower lobe which may reflect post treatment changes although recurrent tumor would be difficult to exclude with a high degree of certainty. 3. Mild increased tracer uptake associated with right paratracheal and subcarinal lymph nodes. This is nonspecific and may be reactive in the setting of chronic inflammation or infection. Metastatic disease cannot be excluded. 4. Chronic appearing loculated pleural fluid collection overlying the posterior right lower lobe with adjacent pleural thickening is new when compared with previous PET-CT from 12/29/2020. Mild tracer uptake is associated with the pleural thickening which is nonspecific and may reflect underlying inflammation or infection. In light of the FDG avid  subpleural nodule overlying the right upper underlying pleural metastasis cannot be excluded. 5. There are 2 subsolid nodules within the left lung which exhibit mild increased tracer uptake. These are nonspecific and may reflect areas of inflammation or infection. Low-grade indolent pulmonary adenocarcinoma cannot be excluded. 6. No signs of metastatic disease to the abdomen or pelvis. 7.  Aortic Atherosclerosis (ICD10-I70.0). Electronically Signed   By: Kerby Moors M.D.   On: 03/02/2022 09:10   MM DIAG BREAST TOMO UNI RIGHT  Result Date: 03/01/2022 CLINICAL DATA:  Patient returns today to evaluate a possible RIGHT breast asymmetry questioned on recent screening mammogram. EXAM: DIGITAL DIAGNOSTIC UNILATERAL RIGHT MAMMOGRAM WITH TOMOSYNTHESIS TECHNIQUE: Right digital diagnostic mammography and breast tomosynthesis was performed. COMPARISON:  Previous exams including recent screening mammogram dated 02/17/2022, chest CT dated 02/17/2022, earlier screening mammogram dated 02/16/2021 and image guided Port-A-Cath insertion dated 01/12/2021. ACR Breast Density Category b: There are scattered areas of fibroglandular density. FINDINGS: Additional diagnostic views were obtained today with scar marker placed on the skin at the site of a recent Port-A-Cath insertion/removal that was complicated by infection/abscess. The questioned asymmetry on recent screening mammogram within the upper inner quadrant of the RIGHT breast, at posterior depth, is confirmed to be related to the site of this Port-A-Cath insertion/removal and is compatible with associated benign scarring/fibrosis. Similar corresponding findings demonstrated on chest CT of 02/17/2022. There are also stable post lumpectomy changes within the anterior RIGHT breast. No new dominant masses, suspicious calcifications or secondary signs of malignancy are identified elsewhere within the RIGHT breast. IMPRESSION: 1. No evidence of malignancy. 2. Benign  scarring/fibrosis within the upper inner quadrant of the RIGHT breast, at posterior depth, corresponding to patient's history of Port-A-Cath insertion/removal complicated by infection/abscess.  3. Stable post lumpectomy changes within the anterior RIGHT breast. Patient may return to routine annual bilateral screening mammogram schedule. RECOMMENDATION: Screening mammogram in one year.(Code:[id number]-B-01Y) I have discussed the findings and recommendations with the patient. If applicable, a reminder letter will be sent to the patient regarding the next appointment. BI-RADS CATEGORY  2: Benign. Electronically Signed   By: Franki Cabot M.D.   On: 03/01/2022 15:03  CT CHEST WO CONTRAST  Result Date: 02/20/2022 CLINICAL DATA:  Restaging non-small cell lung cancer. * Tracking Code: BO * EXAM: CT CHEST WITHOUT CONTRAST TECHNIQUE: Multidetector CT imaging of the chest was performed following the standard protocol without IV contrast. RADIATION DOSE REDUCTION: This exam was performed according to the departmental dose-optimization program which includes automated exposure control, adjustment of the mA and/or kV according to patient size and/or use of iterative reconstruction technique. COMPARISON:  Multiple prior CT examinations. The most recent is 10/27/2021 FINDINGS: Cardiovascular: The heart is normal in size. No pericardial effusion. The aorta is within normal limits in caliber. Stable atherosclerotic calcifications and stable three-vessel coronary artery calcifications. Mediastinum/Nodes: Enlarging mediastinal lymph nodes. Right paratracheal node on image 52/2 measures 9 mm and previously measured 7 mm. Adjacent anterior node measures 7 mm and previously measured 5 mm. Pretracheal node measures 10 mm on image 61/2. This previously measured 7 mm. Subcarinal node on image 79/2 measures 13 mm and previously measured 10 mm. The esophagus is grossly normal. Thyroid gland is unremarkable. Lungs/Pleura: Stable left upper lobe  surgical scarring changes but no findings to suggest recurrent tumor in this area. Post treatment changes involving the right upper lobe centrally appears stable. This measures 27 x 23 mm on image 48/4 and is unchanged. The right lower lobe masslike density appears slightly larger measuring 4.5 x 3.1 cm and previously measuring 3.3 x 2.7 cm. Hazy interstitial changes around the lesion are relatively stable and could be radiation related. Subpleural right upper lobe pulmonary nodule is significantly larger. It measures a maximum of 10 mm and previously measured 5 mm. Ground-glass nodule in the left upper lobe measures 11 x 11 mm and is stable. The more cystic appearing ground-glass lesion slightly more posteriorly in the left upper lobe measures a maximum of 17 mm and is unchanged. Left lower lobe ground-glass lesion with cystic changes measures 17 x 14 mm and previously measured 15 x 12 mm. 3 mm subpleural nodule in the right lower lobe is stable. Stable dense scarring changes in the right lower lobe. No infiltrates or edema. There is a small right pleural effusion which appears stable. Upper Abdomen: No significant upper abdominal findings. No hepatic or adrenal gland lesions are identified. Stable vascular calcifications. Musculoskeletal: No breast masses, supraclavicular or axillary adenopathy. Stable surgical changes involving the subareolar portion of the right breast. The bony thorax is intact. No worrisome lytic or sclerotic bone lesions. IMPRESSION: 1. Stable post treatment changes involving the medial right upper lobe without findings suspicious for recurrent tumor. 2. Enlarging mediastinal lymph nodes are somewhat worrisome. 3. Interval enlargement of the right lower lobe post treatment area suspicious for recurrent tumor. 4. Enlarging subpleural right upper lobe pulmonary nodule worrisome for metastatic focus or metachronous lung cancer. 5. Mostly stable ground-glass lesions in the left lung. There is one  lesion in the left lower lobe which is slightly larger and needs close observation. 6. Given the above findings I would recommend a PET-CT for further evaluation, restaging and possibly finding the best target for a biopsy. 7. Stable small  right pleural effusion. 8. No findings for upper abdominal metastatic disease. 9. Stable surgical changes involving the right breast. Aortic Atherosclerosis (ICD10-I70.0) and Emphysema (ICD10-J43.9). Electronically Signed   By: Marijo Sanes M.D.   On: 02/20/2022 14:25   MM 3D SCREEN BREAST BILATERAL  Result Date: 02/20/2022 CLINICAL DATA:  Screening. EXAM: DIGITAL SCREENING BILATERAL MAMMOGRAM WITH TOMOSYNTHESIS AND CAD TECHNIQUE: Bilateral screening digital craniocaudal and mediolateral oblique mammograms were obtained. Bilateral screening digital breast tomosynthesis was performed. The images were evaluated with computer-aided detection. COMPARISON:  Previous exam(s). ACR Breast Density Category b: There are scattered areas of fibroglandular density. FINDINGS: In the right breast, a possible asymmetry warrants further evaluation. In the left breast, no findings suspicious for malignancy. IMPRESSION: Further evaluation is suggested for possible asymmetry in the right breast. RECOMMENDATION: Diagnostic mammogram and possibly ultrasound of the right breast. (Code:[id number]) The patient will be contacted regarding the findings, and additional imaging will be scheduled. BI-RADS CATEGORY  0: Incomplete. Need additional imaging evaluation and/or prior mammograms for comparison. Electronically Signed   By: Abelardo Diesel M.D.   On: 02/20/2022 09:27        IMPRESSION/PLAN: 1. History of Stage IA, pT1bN0M0 NSCLC, adenosquamous carcinoma of the LUL, Stage IA2, cT1bN0M0 adenocarcinoma of the RUL and Stage IA3,cT1cN0M0, NSCLC of the RLL ; with new putative Stage IA1, cT1aN0M0, NSCLC of the RUL. Dr. Lisbeth Renshaw discusses the pathology findings and reviews the nature of ***  We discussed  the risks, benefits, short, and long term effects of radiotherapy, as well as the curative intent, and the patient is interested in proceeding. Dr. Lisbeth Renshaw discusses the delivery and logistics of radiotherapy and anticipates a course of  ***   2. Remote history of estrogen positive breast cancer and history of anal cancer. She continues with Dr. Benay Spice in medical oncology for her care and sees GYN annually as well.        The above documentation reflects my direct findings during this shared patient visit. Please see the separate note by Dr. Lisbeth Renshaw on this date for the remainder of the patient's plan of care.    Carola Rhine, PAC

## 2022-03-14 ENCOUNTER — Ambulatory Visit: Payer: BC Managed Care – PPO | Admitting: Radiation Oncology

## 2022-03-14 ENCOUNTER — Ambulatory Visit
Admission: RE | Admit: 2022-03-14 | Discharge: 2022-03-14 | Disposition: A | Discharge status: Home / Self Care | Payer: BC Managed Care – PPO | Source: Ambulatory Visit | Attending: Radiation Oncology | Admitting: Radiation Oncology

## 2022-03-14 ENCOUNTER — Other Ambulatory Visit: Payer: Self-pay

## 2022-03-14 ENCOUNTER — Encounter: Payer: Self-pay | Admitting: Radiation Oncology

## 2022-03-14 VITALS — BP 131/73 | HR 97 | Temp 98.4°F | Resp 18 | Ht 66.0 in | Wt 158.0 lb

## 2022-03-14 DIAGNOSIS — Z801 Family history of malignant neoplasm of trachea, bronchus and lung: Secondary | ICD-10-CM | POA: Diagnosis not present

## 2022-03-14 DIAGNOSIS — C3411 Malignant neoplasm of upper lobe, right bronchus or lung: Secondary | ICD-10-CM | POA: Diagnosis not present

## 2022-03-14 DIAGNOSIS — I700 Atherosclerosis of aorta: Secondary | ICD-10-CM | POA: Diagnosis not present

## 2022-03-14 DIAGNOSIS — J90 Pleural effusion, not elsewhere classified: Secondary | ICD-10-CM | POA: Insufficient documentation

## 2022-03-14 DIAGNOSIS — Z79899 Other long term (current) drug therapy: Secondary | ICD-10-CM | POA: Insufficient documentation

## 2022-03-14 DIAGNOSIS — Z853 Personal history of malignant neoplasm of breast: Secondary | ICD-10-CM | POA: Diagnosis not present

## 2022-03-14 DIAGNOSIS — Z87891 Personal history of nicotine dependence: Secondary | ICD-10-CM | POA: Diagnosis not present

## 2022-03-14 DIAGNOSIS — Z85048 Personal history of other malignant neoplasm of rectum, rectosigmoid junction, and anus: Secondary | ICD-10-CM | POA: Diagnosis not present

## 2022-03-14 DIAGNOSIS — Z923 Personal history of irradiation: Secondary | ICD-10-CM | POA: Diagnosis not present

## 2022-03-14 DIAGNOSIS — C3410 Malignant neoplasm of upper lobe, unspecified bronchus or lung: Secondary | ICD-10-CM

## 2022-03-14 DIAGNOSIS — N200 Calculus of kidney: Secondary | ICD-10-CM | POA: Diagnosis not present

## 2022-03-14 DIAGNOSIS — Z9221 Personal history of antineoplastic chemotherapy: Secondary | ICD-10-CM | POA: Insufficient documentation

## 2022-03-14 NOTE — Progress Notes (Signed)
Reconsult for 61yr old female w/ Malignant neoplasm of the RUL.  I verified patient's identity and began nursing interview w/ patient's spouse Mr. Nikyla Navedo in attendance. Patient reports no discomfort at this time.  Meaningful use complete. NO chances of pregnancy.  BP 131/73 (BP Location: Left Arm, Patient Position: Sitting, Cuff Size: Normal)   Pulse 97   Temp 98.4 F (36.9 C) (Oral)   Resp 18   Ht 5\' 6"  (1.676 m)   Wt 158 lb (71.7 kg)   LMP 04/07/2008   SpO2 97%   BMI 25.50 kg/m

## 2022-03-15 DIAGNOSIS — Z87891 Personal history of nicotine dependence: Secondary | ICD-10-CM | POA: Diagnosis not present

## 2022-03-15 DIAGNOSIS — C3411 Malignant neoplasm of upper lobe, right bronchus or lung: Secondary | ICD-10-CM | POA: Diagnosis not present

## 2022-03-16 ENCOUNTER — Telehealth: Payer: Self-pay

## 2022-03-16 NOTE — Patient Outreach (Signed)
  Care Coordination   Initial Visit Note   03/16/2022 Name: Gina Cervantes MRN: [id number] DOB: 1961/05/01  Gina Cervantes is a 61 y.o. year old female who sees Ladell Pier, MD for primary care. I spoke with  Gina Cervantes by phone today.  What matters to the patients health and wellness today?  I am doing good today and the cancer center helps me if needed. Pt's primary care provider is Dr Shirline Frees but only sees him if sick.    Goals Addressed             This Visit's Progress    COMPLETED: Care Coordination Activities - no follow up required       Care Coordination Interventions: Provided education to patient re: regular follow up with primary care provider, care coordination services Assessed social determinant of health barriers         SDOH assessments and interventions completed:  Yes  SDOH Interventions Today    Flowsheet Row Most Recent Value  SDOH Interventions   Food Insecurity Interventions Intervention Not Indicated  Housing Interventions Intervention Not Indicated  Transportation Interventions Intervention Not Indicated  Utilities Interventions Intervention Not Indicated        Care Coordination Interventions Activated:  Yes  Care Coordination Interventions:  Yes, provided   Follow up plan: No further intervention required.   Encounter Outcome:  Pt. Visit Completed  Peter Garter RN, BSN,CCM, CDE Care Management Coordinator [facility] [phone]

## 2022-03-16 NOTE — Patient Instructions (Signed)
Visit Information  Thank you for taking time to visit with me today. Please don't hesitate to contact me if I can be of assistance to you.   Following are the goals we discussed today:   Goals Addressed             This Visit's Progress    COMPLETED: Care Coordination Activities - no follow up required       Care Coordination Interventions: Provided education to patient re: regular follow up with primary care provider, care coordination services Assessed social determinant of health barriers         If you are experiencing a Mental Health or [place] or need someone to talk to, please call the Suicide and Crisis Lifeline: 988 call the [place] National Suicide Prevention Lifeline: 636-587-8095 or TTY: (774) 244-5588 TTY 224-602-0915) to talk to a trained counselor call 1-800-273-TALK (toll free, 24 hour hotline) go to [facility] [phone]) call 911   The patient verbalized understanding of instructions, educational materials, and care plan provided today and DECLINED offer to receive copy of patient instructions, educational materials, and care plan.   No further follow up required:    Peter Garter RN, Jackquline [place], [place] [facility] [phone]

## 2022-03-17 ENCOUNTER — Ambulatory Visit
Admission: RE | Admit: 2022-03-17 | Discharge: 2022-03-17 | Disposition: A | Discharge status: Home / Self Care | Payer: BC Managed Care – PPO | Source: Ambulatory Visit | Attending: Radiation Oncology | Admitting: Radiation Oncology

## 2022-03-17 DIAGNOSIS — C3411 Malignant neoplasm of upper lobe, right bronchus or lung: Secondary | ICD-10-CM | POA: Insufficient documentation

## 2022-03-17 DIAGNOSIS — Z510 Encounter for antineoplastic radiation therapy: Secondary | ICD-10-CM | POA: Diagnosis not present

## 2022-03-17 DIAGNOSIS — Z85118 Personal history of other malignant neoplasm of bronchus and lung: Secondary | ICD-10-CM | POA: Insufficient documentation

## 2022-03-17 DIAGNOSIS — Z87891 Personal history of nicotine dependence: Secondary | ICD-10-CM | POA: Diagnosis not present

## 2022-03-29 DIAGNOSIS — Z510 Encounter for antineoplastic radiation therapy: Secondary | ICD-10-CM | POA: Diagnosis not present

## 2022-03-29 DIAGNOSIS — C3411 Malignant neoplasm of upper lobe, right bronchus or lung: Secondary | ICD-10-CM | POA: Diagnosis not present

## 2022-03-29 DIAGNOSIS — Z85118 Personal history of other malignant neoplasm of bronchus and lung: Secondary | ICD-10-CM | POA: Diagnosis not present

## 2022-03-29 DIAGNOSIS — Z87891 Personal history of nicotine dependence: Secondary | ICD-10-CM | POA: Diagnosis not present

## 2022-04-04 ENCOUNTER — Ambulatory Visit
Admission: RE | Admit: 2022-04-04 | Discharge: 2022-04-04 | Disposition: A | Discharge status: Home / Self Care | Payer: BC Managed Care – PPO | Source: Ambulatory Visit | Attending: Radiation Oncology | Admitting: Radiation Oncology

## 2022-04-04 ENCOUNTER — Other Ambulatory Visit: Payer: Self-pay

## 2022-04-04 DIAGNOSIS — Z510 Encounter for antineoplastic radiation therapy: Secondary | ICD-10-CM | POA: Diagnosis not present

## 2022-04-04 DIAGNOSIS — Z85118 Personal history of other malignant neoplasm of bronchus and lung: Secondary | ICD-10-CM | POA: Diagnosis not present

## 2022-04-04 DIAGNOSIS — C3411 Malignant neoplasm of upper lobe, right bronchus or lung: Secondary | ICD-10-CM | POA: Diagnosis not present

## 2022-04-04 LAB — RAD ONC ARIA SESSION SUMMARY
Course Elapsed Days: 0
Plan Fractions Treated to Date: 1
Plan Prescribed Dose Per Fraction: 18 Gy
Plan Total Fractions Prescribed: 3
Plan Total Prescribed Dose: 54 Gy
Reference Point Dosage Given to Date: 18 Gy
Reference Point Session Dosage Given: 18 Gy
Session Number: 1

## 2022-04-06 ENCOUNTER — Ambulatory Visit
Admission: RE | Admit: 2022-04-06 | Discharge: 2022-04-06 | Disposition: A | Discharge status: Home / Self Care | Payer: BC Managed Care – PPO | Source: Ambulatory Visit | Attending: Radiation Oncology | Admitting: Radiation Oncology

## 2022-04-06 ENCOUNTER — Other Ambulatory Visit: Payer: Self-pay

## 2022-04-06 DIAGNOSIS — Z85118 Personal history of other malignant neoplasm of bronchus and lung: Secondary | ICD-10-CM | POA: Diagnosis not present

## 2022-04-06 DIAGNOSIS — C3411 Malignant neoplasm of upper lobe, right bronchus or lung: Secondary | ICD-10-CM | POA: Diagnosis not present

## 2022-04-06 DIAGNOSIS — Z510 Encounter for antineoplastic radiation therapy: Secondary | ICD-10-CM | POA: Diagnosis not present

## 2022-04-06 LAB — RAD ONC ARIA SESSION SUMMARY
Course Elapsed Days: 2
Plan Fractions Treated to Date: 2
Plan Prescribed Dose Per Fraction: 18 Gy
Plan Total Fractions Prescribed: 3
Plan Total Prescribed Dose: 54 Gy
Reference Point Dosage Given to Date: 36 Gy
Reference Point Session Dosage Given: 18 Gy
Session Number: 2

## 2022-04-11 ENCOUNTER — Other Ambulatory Visit: Payer: Self-pay

## 2022-04-11 ENCOUNTER — Encounter: Payer: Self-pay | Admitting: Radiation Oncology

## 2022-04-11 ENCOUNTER — Ambulatory Visit
Admission: RE | Admit: 2022-04-11 | Discharge: 2022-04-11 | Disposition: A | Discharge status: Home / Self Care | Payer: BC Managed Care – PPO | Source: Ambulatory Visit | Attending: Radiation Oncology | Admitting: Radiation Oncology

## 2022-04-11 DIAGNOSIS — Z87891 Personal history of nicotine dependence: Secondary | ICD-10-CM | POA: Diagnosis not present

## 2022-04-11 DIAGNOSIS — Z510 Encounter for antineoplastic radiation therapy: Secondary | ICD-10-CM | POA: Diagnosis not present

## 2022-04-11 DIAGNOSIS — C3411 Malignant neoplasm of upper lobe, right bronchus or lung: Secondary | ICD-10-CM | POA: Insufficient documentation

## 2022-04-11 DIAGNOSIS — Z85118 Personal history of other malignant neoplasm of bronchus and lung: Secondary | ICD-10-CM | POA: Diagnosis not present

## 2022-04-11 LAB — RAD ONC ARIA SESSION SUMMARY
Course Elapsed Days: 7
Plan Fractions Treated to Date: 3
Plan Prescribed Dose Per Fraction: 18 Gy
Plan Total Fractions Prescribed: 3
Plan Total Prescribed Dose: 54 Gy
Reference Point Dosage Given to Date: 54 Gy
Reference Point Session Dosage Given: 18 Gy
Session Number: 3

## 2022-04-12 NOTE — Progress Notes (Addendum)
                                                                                                                                                             Patient Name: Gina Cervantes MRN: [id number] DOB: [birth year] Referring Physician: Betsy Coder (Profile Not Attached) Date of Service: 04/11/2022 [facility] [place], [place]                                                        End Of Treatment Note  Diagnoses: 154.3-Malignant neoplasm of anus unspecified site C34.11-Malignant neoplasm of upper lobe, right bronchus or lung  Cancer Staging: History of Stage IA, pT1bN0M0 NSCLC, adenosquamous carcinoma of the LUL, Stage IA2, cT1bN0M0 adenocarcinoma of the RUL and Stage IA3,cT1cN0M0, NSCLC of the RLL ; with new putative Stage IA1, cT1aN0M0, NSCLC of the RUL.   Intent: Curative  Radiation Treatment Dates:  04/04/2022 through 04/11/2022 SBRT Treatment  Site Technique Total Dose (Gy) Dose per Fx (Gy) Completed Fx Beam Energies  Lung, Right: RUL Target IMRT 54/54 18 3/3 6XFFF   Narrative: The patient tolerated radiation therapy relatively well. She had some fatigue during therapy but no other complaints.  Plan: The patient will receive a call in about one month from the radiation oncology department. She will continue follow up with Dr. Benay Spice as well with surveillance imaging as well as for her history of breast and anal cancer.   ________________________________________________    Carola Rhine, PAC

## 2022-04-24 ENCOUNTER — Encounter (HOSPITAL_BASED_OUTPATIENT_CLINIC_OR_DEPARTMENT_OTHER): Payer: Self-pay | Admitting: Advanced Practice Midwife

## 2022-04-24 ENCOUNTER — Other Ambulatory Visit (HOSPITAL_COMMUNITY)
Admission: RE | Admit: 2022-04-24 | Discharge: 2022-04-24 | Disposition: A | Discharge status: Home / Self Care | Payer: BC Managed Care – PPO | Source: Ambulatory Visit | Attending: Advanced Practice Midwife | Admitting: Advanced Practice Midwife

## 2022-04-24 ENCOUNTER — Ambulatory Visit (INDEPENDENT_AMBULATORY_CARE_PROVIDER_SITE_OTHER): Payer: BC Managed Care – PPO | Admitting: Advanced Practice Midwife

## 2022-04-24 VITALS — BP 117/53 | HR 89 | Ht 65.5 in | Wt 157.8 lb

## 2022-04-24 DIAGNOSIS — R87810 Cervical high risk human papillomavirus (HPV) DNA test positive: Secondary | ICD-10-CM | POA: Insufficient documentation

## 2022-04-24 DIAGNOSIS — Z01419 Encounter for gynecological examination (general) (routine) without abnormal findings: Secondary | ICD-10-CM | POA: Diagnosis not present

## 2022-04-24 DIAGNOSIS — Z85048 Personal history of other malignant neoplasm of rectum, rectosigmoid junction, and anus: Secondary | ICD-10-CM

## 2022-04-24 DIAGNOSIS — R87618 Other abnormal cytological findings on specimens from cervix uteri: Secondary | ICD-10-CM

## 2022-04-24 DIAGNOSIS — Z853 Personal history of malignant neoplasm of breast: Secondary | ICD-10-CM

## 2022-04-24 DIAGNOSIS — C3412 Malignant neoplasm of upper lobe, left bronchus or lung: Secondary | ICD-10-CM | POA: Diagnosis not present

## 2022-04-24 NOTE — Progress Notes (Signed)
Subjective:     Gina Cervantes is a 61 y.o. female here at [facility] for a routine exam. Her hx is significant for breast cancer, anal cancer and she is currently undergoing treatment for lung cancer.  She was hpv positive with normal cytology on Pap in 2020, repeat Pap in 2021 with hpv positive and normal cytology again. Colposcopy resulted as CIN 1 so repeat Pap was done 12/20/2020 which again showed hpv positive with normal cytology.  Colpo was recommended by Dr Sabra Heck at that time but pt had new cancer dx and life was busy so did not schedule colposcopy.  She denies any gyn concerns or problems.  Personal health questionnaire reviewed: yes.  Do you have a primary care provider? yes Do you feel safe at home? yes  Needles Visit from 04/24/2022 in [facility]  PHQ-2 Total Score 0       Health Maintenance Due  Topic Date Due   HIV Screening  Never done   Zoster Vaccines- Shingrix (1 of 2) Never done   DTaP/Tdap/Td (3 - Tdap) 08/22/2018   COVID-19 Vaccine (5 - 2023-24 season) 02/27/2022     Risk factors for chronic health problems: Smoking: Alchohol/how much: Pt BMI: There is no height or weight on file to calculate BMI.   Gynecologic History Patient's last menstrual period was 04/07/2008. Contraception: post menopausal status Last Pap: 2022. Results were: cytology normal with positive HPV Last mammogram: 02/17/22 with incomplete results followed by diagnostic mammo of right breast with normal findings. F/U with annual screening mammogram   Obstetric History OB History  Gravida Para Term Preterm AB Living  4 2     2 1   SAB IAB Ectopic Multiple Live Births  2       2    # Outcome Date GA Lbr Len/2nd Weight Sex Delivery Anes PTL Lv  4 Para 01/1992    M Vag-Spont   LIV  3 Para 07/1988    M CS-Unspec   LIV  2 SAB           1 SAB             Obstetric Comments  G2,P2  menarce age 52  Oldest son passed 2014     The following  portions of the patient's history were reviewed and updated as appropriate: allergies, current medications, past family history, past medical history, past social history, past surgical history, and problem list.  Review of Systems Pertinent items noted in HPI and remainder of comprehensive ROS otherwise negative.    Objective:   VS reviewed, nursing note reviewed,  Constitutional: well developed, well nourished, no distress HEENT: normocephalic, thyroid without enlargement or mass HEART: RRR, no murmurs rubs/gallops RESP: clear and equal to auscultation bilaterally in all lobes  Breast Exam:   exam performed: right breast normal without mass, skin or nipple changes or axillary nodes, left breast normal without mass, skin or nipple changes or axillary nodes Abdomen: soft Neuro: alert and oriented x 3 Skin: warm, dry Psych: affect normal Pelvic exam:  Cervix pink with patches of red, visually closed, scant white creamy discharge, vaginal walls and external genitalia normal Bimanual exam: Cervix 0/long/high, firm, anterior, neg CMT, uterus nontender, nonenlarged, adnexa without tenderness, enlargement, or mass        Assessment/Plan:   1. Cervical high risk human papillomavirus (HPV) DNA test positive --Pap in 2022 was follow up from colpo in 2021 with CIN 1.  Pap again resulted  with normal cytology but hpv positive.  Colpo was recommended but not done.  --Repeat Pap today, consult with Dr Sabra Heck for further management  - Cytology - PAP( [facility])  2. Well woman exam with routine gynecological exam   3. Malignant neoplasm of upper lobe of left lung ([place]) --Pt doing well with radiation therapy currently  4. History of right breast cancer   5. History of anal cancer     No follow-ups on file.   Fatima Blank, CNM 4:12 PM

## 2022-05-04 LAB — CYTOLOGY - PAP
Comment: NEGATIVE
Comment: NEGATIVE
Comment: NEGATIVE
Diagnosis: NEGATIVE
Diagnosis: REACTIVE
HPV 16: NEGATIVE
HPV 18 / 45: NEGATIVE
High risk HPV: POSITIVE — AB

## 2022-05-17 ENCOUNTER — Other Ambulatory Visit: Payer: Self-pay | Admitting: Oncology

## 2022-05-22 ENCOUNTER — Encounter (HOSPITAL_BASED_OUTPATIENT_CLINIC_OR_DEPARTMENT_OTHER): Payer: Self-pay | Admitting: Obstetrics & Gynecology

## 2022-05-22 ENCOUNTER — Other Ambulatory Visit (HOSPITAL_BASED_OUTPATIENT_CLINIC_OR_DEPARTMENT_OTHER): Payer: Self-pay

## 2022-05-22 ENCOUNTER — Other Ambulatory Visit (HOSPITAL_COMMUNITY)
Admission: RE | Admit: 2022-05-22 | Discharge: 2022-05-22 | Disposition: A | Discharge status: Home / Self Care | Payer: BC Managed Care – PPO | Source: Ambulatory Visit | Attending: Obstetrics & Gynecology | Admitting: Obstetrics & Gynecology

## 2022-05-22 ENCOUNTER — Ambulatory Visit (INDEPENDENT_AMBULATORY_CARE_PROVIDER_SITE_OTHER): Payer: BC Managed Care – PPO | Admitting: Obstetrics & Gynecology

## 2022-05-22 VITALS — BP 117/66 | HR 90 | Ht 65.5 in | Wt 154.8 lb

## 2022-05-22 DIAGNOSIS — B977 Papillomavirus as the cause of diseases classified elsewhere: Secondary | ICD-10-CM | POA: Diagnosis not present

## 2022-05-22 DIAGNOSIS — N72 Inflammatory disease of cervix uteri: Secondary | ICD-10-CM | POA: Insufficient documentation

## 2022-05-22 DIAGNOSIS — N870 Mild cervical dysplasia: Secondary | ICD-10-CM | POA: Diagnosis not present

## 2022-05-22 NOTE — Progress Notes (Signed)
62 y.o. [id number] Married White female here for colposcopy with possible biopsies and/or ECC due to +HR HPV Pap obtained 04/24/2022.  Denies vaginal bleeding.     Patient's last menstrual period was 04/07/2008.          Sexually active: Yes.    The current method of family planning is post menopausal status.     Patient has been counseled about results and procedure.  Risks and benefits have bene reviewed including immediate and/or delayed bleeding, infection, cervical scaring from procedure, possibility of needing additional follow up as well as treatment.  Rare risks of missing a lesion discussed as well.  All questions answered.  Pt ready to proceed.  Consent obtained.  BP 117/66 (BP Location: Right Arm, Patient Position: Sitting, Cuff Size: Large)   Pulse 90   Ht 5' 5.5" (1.664 m) Comment: Reported  Wt 154 lb 12.8 oz (70.2 kg)   LMP 04/07/2008   BMI 25.37 kg/m   General appearance: alert, cooperative and appears stated age Lymph nodes: No abnormal inguinal nodes palpated Neurologic: Grossly normal  Pelvic: External genitalia:  no lesions              Urethra:  normal appearing urethra with no masses, tenderness or lesions              Bartholins and Skenes: normal                 Vagina: normal appearing vagina with normal color and no discharge, no lesions               Physical Exam Constitutional:      Appearance: Normal appearance.  Genitourinary:    Vagina: Normal.     Cervix: Normal.     Comments: Vaginal atrophy noted Lymphadenopathy:     Lower Body: No right inguinal adenopathy. No left inguinal adenopathy.  Neurological:     General: No focal deficit present.     Mental Status: She is alert.  Psychiatric:        Mood and Affect: Mood normal.     Speculum placed.  3% acetic acid applied to cervix for >45 seconds.  Cervix visualized with both 7.5X and 15X magnification.  Green filter also used.  Lugols solution was not used.  Findings:  normal appearing cervix,  normal transition zone.  Biopsy:  None obtained.  ECC:  was performed.  Monsel's was not needed.  Excellent hemostasis was present.  Pt tolerated procedure well and all instruments were removed.   Chaperone, Ina Homes, was present during procedure.  Assessment/Plan:  1. High risk human papilloma virus (HPV) infection of cervix - Pathology results will be called to patient and follow-up planned pending results.

## 2022-05-23 ENCOUNTER — Other Ambulatory Visit (HOSPITAL_BASED_OUTPATIENT_CLINIC_OR_DEPARTMENT_OTHER): Payer: Self-pay

## 2022-05-23 LAB — SURGICAL PATHOLOGY

## 2022-05-23 MED ORDER — COMIRNATY 30 MCG/0.3ML IM SUSY
PREFILLED_SYRINGE | INTRAMUSCULAR | 0 refills | Status: DC
  Filled 2022-05-23: qty 0.3, 1d supply, fill #0

## 2022-05-24 ENCOUNTER — Other Ambulatory Visit (HOSPITAL_BASED_OUTPATIENT_CLINIC_OR_DEPARTMENT_OTHER): Payer: Self-pay

## 2022-05-29 ENCOUNTER — Telehealth (HOSPITAL_BASED_OUTPATIENT_CLINIC_OR_DEPARTMENT_OTHER): Payer: Self-pay | Admitting: *Deleted

## 2022-05-29 ENCOUNTER — Ambulatory Visit
Admission: RE | Admit: 2022-05-29 | Discharge: 2022-05-29 | Disposition: A | Discharge status: Home / Self Care | Payer: BC Managed Care – PPO | Source: Ambulatory Visit | Attending: Radiation Oncology | Admitting: Radiation Oncology

## 2022-05-29 DIAGNOSIS — C3411 Malignant neoplasm of upper lobe, right bronchus or lung: Secondary | ICD-10-CM | POA: Insufficient documentation

## 2022-05-29 DIAGNOSIS — Z510 Encounter for antineoplastic radiation therapy: Secondary | ICD-10-CM | POA: Insufficient documentation

## 2022-05-29 DIAGNOSIS — Z85118 Personal history of other malignant neoplasm of bronchus and lung: Secondary | ICD-10-CM | POA: Insufficient documentation

## 2022-05-29 NOTE — Telephone Encounter (Signed)
Called pt to let her know that I discussed her concerns regarding abnormal PAP smear. Advised that if she wanted to proceed with LEEP to remove area of concern, Dr. Hyacinth Meeker is happy to do that for her. Pt will think about it and let us know if she wants to proceed.

## 2022-05-29 NOTE — Progress Notes (Signed)
  Radiation Oncology         [phone] ________________________________  Name: Gina Cervantes MRN: [id number]  Date of Service: 05/29/2022  DOB: 1960-06-17  Post Treatment Telephone Note  Diagnosis:  History of Stage IA, pT1bN0M0 NSCLC, adenosquamous carcinoma of the LUL, Stage IA2, cT1bN0M0 adenocarcinoma of the RUL and Stage IA3,cT1cN0M0, NSCLC of the RLL ; with new putative Stage IA1, cT1aN0M0, NSCLC of the RUL.   Intent: Curative  Radiation Treatment Dates:  04/04/2022 through 04/11/2022 SBRT Treatment   Site Technique Total Dose (Gy) Dose per Fx (Gy) Completed Fx Beam Energies  Lung, Right: RUL Target IMRT 54/54 18 3/3 6XFFF  (as documented in provider EOT note)   The patient was not available for call today. A detailed voicemail was left.   The patient has scheduled follow up with her medical oncologist Dr. Truett Perna for ongoing care, and was encouraged to call if she develops concerns or questions regarding radiation.   Ruel Favors, LPN

## 2022-05-31 ENCOUNTER — Ambulatory Visit (HOSPITAL_BASED_OUTPATIENT_CLINIC_OR_DEPARTMENT_OTHER): Payer: BC Managed Care – PPO | Admitting: Obstetrics & Gynecology

## 2022-06-06 ENCOUNTER — Other Ambulatory Visit: Payer: Self-pay | Admitting: *Deleted

## 2022-06-06 ENCOUNTER — Encounter: Payer: Self-pay | Admitting: *Deleted

## 2022-06-06 DIAGNOSIS — C3490 Malignant neoplasm of unspecified part of unspecified bronchus or lung: Secondary | ICD-10-CM

## 2022-06-06 NOTE — Progress Notes (Signed)
Per Dr. Benay Spice: Placed order for CT chest w/o contrast. Patient notified that order is now there if she wishes to schedule it.

## 2022-06-11 ENCOUNTER — Ambulatory Visit (HOSPITAL_BASED_OUTPATIENT_CLINIC_OR_DEPARTMENT_OTHER)
Admission: RE | Admit: 2022-06-11 | Discharge: 2022-06-11 | Disposition: A | Discharge status: Home / Self Care | Payer: BC Managed Care – PPO | Source: Ambulatory Visit | Attending: Oncology | Admitting: Oncology

## 2022-06-11 DIAGNOSIS — C3490 Malignant neoplasm of unspecified part of unspecified bronchus or lung: Secondary | ICD-10-CM

## 2022-06-11 DIAGNOSIS — J439 Emphysema, unspecified: Secondary | ICD-10-CM | POA: Diagnosis not present

## 2022-06-11 DIAGNOSIS — J90 Pleural effusion, not elsewhere classified: Secondary | ICD-10-CM | POA: Diagnosis not present

## 2022-06-11 DIAGNOSIS — R918 Other nonspecific abnormal finding of lung field: Secondary | ICD-10-CM | POA: Diagnosis not present

## 2022-06-12 ENCOUNTER — Inpatient Hospital Stay: Payer: BC Managed Care – PPO | Attending: Oncology | Admitting: Oncology

## 2022-06-12 ENCOUNTER — Encounter: Payer: Self-pay | Admitting: Oncology

## 2022-06-12 VITALS — BP 131/67 | HR 105 | Temp 98.2°F | Resp 20 | Ht 65.0 in | Wt 153.0 lb

## 2022-06-12 DIAGNOSIS — Z923 Personal history of irradiation: Secondary | ICD-10-CM | POA: Insufficient documentation

## 2022-06-12 DIAGNOSIS — C3490 Malignant neoplasm of unspecified part of unspecified bronchus or lung: Secondary | ICD-10-CM

## 2022-06-12 DIAGNOSIS — Z853 Personal history of malignant neoplasm of breast: Secondary | ICD-10-CM | POA: Diagnosis not present

## 2022-06-12 DIAGNOSIS — Z85048 Personal history of other malignant neoplasm of rectum, rectosigmoid junction, and anus: Secondary | ICD-10-CM | POA: Diagnosis not present

## 2022-06-12 NOTE — Progress Notes (Signed)
[facility] OFFICE PROGRESS NOTE   Diagnosis: Non-small cell lung cancer  INTERVAL HISTORY:   [name] completed radiation to the right upper lung mass in 3 fractions from 03/27/2022 - 04/11/2022.  She generally feels well.  She has mild exertional dyspnea.  No other complaint.  He underwent a colposcopy and biopsy on 05/22/2022 after an HPV positive Pap smear.  The pathology from an endocervix curettage revealed low-grade squamous intraepithelial neoplasia 1 of 3.  She reports she will see Dr. Sabra Heck for follow-up in 1 year.   Objective:  Vital signs in last 24 hours:  Blood pressure 131/67, pulse (!) 105, temperature 98.2 F (36.8 C), temperature source Oral, resp. rate 20, height 5\' 5"  (1.651 m), weight 153 lb (69.4 kg), last menstrual period 04/07/2008, SpO2 90 %.    Lymphatics: No cervical, supraclavicular, axillary, or inguinal nodes Resp: Decreased breath sounds at the right compared to the left posterior chest, no respiratory distress Cardio: Regular rate and rhythm GI: No hepatosplenomegaly Vascular: No leg edema   Lab Results:  Lab Results  Component Value Date   WBC 5.8 08/08/2021   HGB 14.0 08/08/2021   HCT 41.9 08/08/2021   MCV 102.4 (H) 08/08/2021   PLT 224 08/08/2021   NEUTROABS 3.9 08/08/2021    CMP  Lab Results  Component Value Date   NA 136 08/08/2021   K 4.0 08/08/2021   CL 94 (L) 08/08/2021   CO2 31 08/08/2021   GLUCOSE 117 (H) 08/08/2021   BUN 16 08/08/2021   CREATININE 0.98 08/08/2021   CALCIUM 9.5 08/08/2021   PROT 7.8 08/08/2021   ALBUMIN 3.6 08/08/2021   AST 23 08/08/2021   ALT 12 08/08/2021   ALKPHOS 68 08/08/2021   BILITOT 0.5 08/08/2021   GFRNONAA >60 08/08/2021   GFRAA >60 01/29/2020    Lab Results  Component Value Date   CEA 5.5 (H) 08/02/2012    Lab Results  Component Value Date   INR 1.04 06/07/2018   LABPROT 13.5 06/07/2018    Imaging:  CT Chest Wo Contrast  Result Date: 06/11/2022 CLINICAL  DATA:  Non-small cell lung cancer restaging * Tracking Code: BO * EXAM: CT CHEST WITHOUT CONTRAST TECHNIQUE: Multidetector CT imaging of the chest was performed following the standard protocol without IV contrast. RADIATION DOSE REDUCTION: This exam was performed according to the departmental dose-optimization program which includes automated exposure control, adjustment of the mA and/or kV according to patient size and/or use of iterative reconstruction technique. COMPARISON:  PET-CT 03/02/2022 and CT chest 02/17/2022 FINDINGS: Cardiovascular: Coronary, aortic arch, and branch vessel atherosclerotic vascular disease. Stably prominent main pulmonary artery, 3.9 cm diameter on image 79 series 2, compatible with pulmonary arterial hypertension. Mediastinum/Nodes: Index right lower paratracheal lymph node 0.9 cm in short axis, image 60 series 2 (stable by my measurement). Index right hilar node 0.9 cm in short axis on image 76 series 2, formerly the same. Lungs/Pleura: Biapical pleuroparenchymal scarring.  Emphysema. Right suprahilar mass 3.8 by 2.6 cm on image 51 series 4, previously same by my measurements. Stable scarring anterior to this mass. Stable peripheral scarring along the right anterior chest primarily in the upper lobe and middle lobe. Right infrahilar mass 4.4 by 3.0 cm on image 100 series 4, previously 4.3 by 3.1 cm by my measurements, accordingly essentially stable. Right lower lobe scarring along the right hemidiaphragm. Stable mild subpleural nodularity in the right lower lobe. Small right pleural effusion is stable from 02/17/2022. 1.1 by 0.7 cm  left lower lobe ground-glass density nodule on image 100 series 4, previously 1.2 by 1.0 cm. 1.3 by 1.1 cm sub solid left upper lobe nodule on image 76 series 4, previously the same by my measurements. Stable scarring in the left upper lobe. Other subpleural nodularity in the left upper lobe is stable. Upper Abdomen: Unremarkable Musculoskeletal: Unremarkable  IMPRESSION: 1. Stable appearance of the right suprahilar and right infrahilar masses. 2. Stable small right pleural effusion. 3. Stable sub solid nodules in the left upper lobe and left lower lobe. 4. Stable scarring in the right upper lobe and right lower lobe. 5. Stably prominent main pulmonary artery, 3.9 cm diameter, compatible with pulmonary arterial hypertension. 6. Coronary, aortic arch, and branch vessel atherosclerotic vascular disease. Aortic Atherosclerosis (ICD10-I70.0). Electronically Signed   By: Van Clines M.D.   On: 06/11/2022 13:34    Medications: I have reviewed the patient's current medications.   Assessment/Plan: Anal cancer-left anal margin/anal canal mass, status post a biopsy on 08/09/2012 confirming invasive moderately differentiated squamous cell carcinoma,p16 positive.   -Staging PET scan 08/20/2012-hypermetabolic anal mass, no hypermetabolic metastases   -Initiation of concurrent radiation with cycle 1 of mitomycin C./5-fluorouracil 09/02/2012.   -She completed cycle 2 5-fluorouracil/mitomycin C. beginning 10/01/2012.   -She completed radiation 10/10/2012.   2. Right breast cancer 2009, ER positive, PR positive, HER-2 positive. She is status post neoadjuvant AC x4 cycles followed by Taxol/Herceptin. She underwent a right lumpectomy and sentinel lymph node biopsy 09/22/2008 with no evidence of residual invasive carcinoma and 5 negative sentinel lymph nodes. She then completed right breast radiation. She began tamoxifen 12/15/2008 and completed adjuvant Herceptin 05/25/2009. The tamoxifen was discontinued and she was switched to Arimidex in July 2013.  Arimidex discontinued in mid July 2019. 3. History of enlargement of the right tonsil.   4. Nonspecific pulmonary nodules without hypermetabolic activity on the PET scan 08/20/2012. Chest CT 10/21/2012 with scattered pulmonary nodules including nodules that were not present in 2010. Annual surveillance was recommended.  Follow-up chest CT 10/24/2013 with scattered groundglass densities again noted in both lungs mostly unchanged when compared to the previous study. A sub-solid right lower lobe nodule had increased central density. The size was unchanged.The nodule has been present since 2009. 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 No EGFR, ALK, BRAF, RET, ERBB2, or ROS1 alteration CT chest 11/14/2016-status post left upper lobe wedge resection, stable right lung nodules Chest CT 05/02/2017-stable bilateral solid and groundglass nodules, stable mild mediastinal lymphadenopathy CT chest 12/13/2017- enlargement of right upper lobe nodule, other lung nodules and chest lymph node stable PET 01/09/2018- right upper lobe nodule and right lower lobe nodule have enlarged since 2018 and have low level metabolic activity increased size of an 8 mm right upper lobe nodule without increased metabolic activity, stable activity and a mildly enlarged lower paratracheal node, no evidence of  metastatic disease in the abdomen or pelvis CT chest 04/22/2018- no new lung nodules, dominant right lung nodules slightly increased in size Bronchoscopy/EBUS 05/04/2019 with biopsy of level 7 node, biopsy of right lower lobe nodule, FNA of  right lower lobe nodule-negative CT biopsy of right upper lobe nodule 06/07/2018- adenocarcinoma with lepidic and acinar patterns SBRT to the dominant right upper lobe nodule 07/23/2018-07/30/2018 CT 09/16/2018-slight increase in size of medial right upper lobe nodule-possibly secondary to interval radiation, other nodules are unchanged CT 12/25/2018- medial right upper lobe nodule has decreased in size slightly, multiple additional solid and groundglass nodules are stable CT 06/09/2019-enlarging right lower lobe nodule, enlarging subsolid nodule, new right upper lobe nodule SBRT to right lower lobe lung nodule, 3 fractions 07/08/2019-07/14/2019 CT 12/05/2019-dominant right lower lobe lesion stable to slightly decreased in size, progressive solid component associated with a dominant right upper lung nodule, mild progression of a left upper lobe nodule, new subsolid anterior medial left upper lobe nodule Cycle 1 Alimta/carboplatin/pembrolizumab 02/04/2020 Cycle 2 Alimta/carboplatin/pembrolizumab 02/23/2020 Cycle 3 Alimta/carboplatin/pembrolizumab 03/15/2020 Cycle 4 Alimta/carboplatin/pembrolizumab 04/05/2020 (Alimta and carboplatin dose reduced secondary to anemia) CT chest 04/12/2020-decreased size of solid right lower lobe and right upper lobe nodules, groundglass nodules in the left lung unchanged Cycle 5 Alimta/pembrolizumab 05/03/2020 Cycle 6 Alimta/pembrolizumab 05/21/2020 Cycle 7 Alimta/pembrolizumab 06/14/2020 Cycle 8 Alimta/pembrolizumab 07/05/2020 CT chest 07/22/2020-stable nodules, no evidence of disease progression, unchanged multifocal groundglass nodules suspicious for multifocal adenocarcinoma. Cycle 9 Alimta/Pembrolizumab 07/26/2020 Cycle 10 Alimta/Pembrolizumab 08/16/2020 Cycle 11 Alimta/Pembrolizumab 09/06/2020 Cycle 12 Alimta/pembrolizumab 09/27/2020 Cycle 13 Alimta/pembrolizumab 10/18/2020 CT chest 11/12/2020-enlarging soft tissue density right upper lobe medially adjacent to the mediastinum measuring  2.7 x 2.1 cm, concerning for recurrent tumor.  Stable radiation changes involving the right upper lobe and right lower lobe.  Stable scattered small solid and subsolid pulmonary nodules bilaterally.  New right-sided pleural effusion.  Stable borderline enlarged mediastinal lymph nodes. Bronchoscopy 12/28/2020-right upper lobe and left lower lobe lesions biopsy-negative pathology PET 5/68/1275-TZG metabolic activity involving the right suprahilar consolidative process-favored benign, measured smaller, additional groundglass densities with very low-level metabolic activity, radiation change in the right lower lobe, resolution of right pleural effusion, no distant metastatic disease Cycle 14 Alimta/pembrolizumab 01/19/2021 Cycle 15 Alimta/pembrolizumab 02/07/2021 Cycle 16 of Alimta/pembrolizumab 02/28/2021 Cycle 17 Alimta/pembrolizumab 03/28/2021 CT chest 04/18/2021-increased areas of masslike density in the right upper lobe and right lower lobe with a new right lower lobe satellite nodule, other groundglass attenuation lesions are stable, slight decrease in size of chronic right pleural effusion CT head 05/06/2021-no evidence of metastatic disease CT chest 05/20/2018-right upper lobe and right lower lobe masses are unchanged, groundglass nodules in the left lung are unchanged, no mediastinal adenopathy, resist measurements of target lesions given 05/30/2021 SWOG [id number]-sotorasib Sotorasib placed on hold 06/30/2021 CT chest 07/07/2021-no change in right suprahilar/paramediastinal density, stable right lower lobe nodularity with adjacent bandlike density, stable groundglass left lung nodules CT chest 10/27/2021-mild increase in density of the right infrahilar mass, no change in size, new 5 mm right upper lobe subpleural nodule, stable left lung nodules CT chest 02/17/2022-enlargement of small mediastinal nodes, stable posttreatment changes at the central right upper lobe, right lower lobe masslike density slightly  larger, subpleural right upper lobe pulmonary nodule larger, slight enlargement of groundglass lesion in the left lower lobe PET 03/02/2022-hypermetabolic right upper lobe subpleural nodule, mild tracer activity in treated tumor in the right upper lobe and right lower lobe-posttreatment change?,  Mild increased activity associated with right paratracheal and subcarinal nodes, chronic loculated right  pleural effusion with mild tracer uptake, subsolid left lung nodules with mild tracer uptake, no evidence of metastatic disease to the abdomen or pelvis SBRT to right subpleural mass 04/04/2022 - 04/11/2022, 54 Gray in 3 fractions CT chest 06/11/2022-stable right suprahilar and right infrahilar masses, stable small right pleural effusion, stable subsolid nodules in the left upper lobe and left lower lobe, right subpleural mass appears smaller with new inflammatory changes by my review 5.  Baker's cyst left leg, pain and swelling in the left calf, negative Doppler 01/24/2020 and 01/29/2020-evaluated by orthopedics and underwent aspiration of the cyst 01/29/2000, completed a course of Keflex 6.  Anemia secondary to chemotherapy, status post 2 units of packed red blood cells on 04/10/2020-improved 7.  Port-A-Cath placement 01/12/2021; Port-A-Cath removal 02/24/2021 due to infection, culture positive for MRSA, status post 2 courses of doxycycline, Bactrim DS started 03/28/2021, Bactrim DS resumed 04/26/2021 8.  Diarrhea beginning 06/21/2021-sotorasib discontinued 07/01/2021, resolved        Disposition: Gina Cervantes appears stable.  She completed SBRT to the right subpleural mass in December.  The CT 06/11/2022 reveals no evidence of disease progression.  The CT port does not specifically mention the right subpleural mass, but the lesion appears smaller with remaining inflammatory changes on review today.  We will ask for an addendum to the current CT report.  The plan is to continue observation.  She will return for an  office visit and chest CT in 3 months.  Betsy Coder, MD  06/12/2022  3:25 PM

## 2022-06-19 ENCOUNTER — Telehealth: Payer: Self-pay | Admitting: Pulmonary Disease

## 2022-06-19 ENCOUNTER — Other Ambulatory Visit: Payer: Self-pay | Admitting: *Deleted

## 2022-06-19 MED ORDER — BREZTRI AEROSPHERE 160-9-4.8 MCG/ACT IN AERO: 2.0000 | INHALATION_SPRAY | Freq: Two times a day (BID) | RESPIRATORY_TRACT | 0 refills | Status: DC

## 2022-06-19 MED ORDER — ALBUTEROL SULFATE HFA 108 (90 BASE) MCG/ACT IN AERS: INHALATION_SPRAY | RESPIRATORY_TRACT | 1 refills | Status: DC

## 2022-06-19 NOTE — Telephone Encounter (Signed)
Called and spoke with patient. Patient verified pharmacy. Rx has been sent to patients pharmacy.   Nothing further needed.

## 2022-07-10 ENCOUNTER — Other Ambulatory Visit: Payer: Self-pay

## 2022-07-10 ENCOUNTER — Telehealth: Payer: Self-pay | Admitting: Pulmonary Disease

## 2022-07-10 MED ORDER — BREZTRI AEROSPHERE 160-9-4.8 MCG/ACT IN AERO: 2.0000 | INHALATION_SPRAY | Freq: Two times a day (BID) | RESPIRATORY_TRACT | 6 refills | Status: DC

## 2022-07-10 NOTE — Telephone Encounter (Signed)
Patient states needs refill for Breztri. Pharmacy is [facility].

## 2022-07-10 NOTE — Telephone Encounter (Signed)
Refills for Gina Cervantes have been sent to patients pharmacy. She is aware. Nothing further needed.

## 2022-07-16 ENCOUNTER — Other Ambulatory Visit: Payer: Self-pay | Admitting: Pulmonary Disease

## 2022-07-21 DIAGNOSIS — J159 Unspecified bacterial pneumonia: Secondary | ICD-10-CM | POA: Diagnosis not present

## 2022-07-21 DIAGNOSIS — C3490 Malignant neoplasm of unspecified part of unspecified bronchus or lung: Secondary | ICD-10-CM | POA: Diagnosis not present

## 2022-08-23 ENCOUNTER — Other Ambulatory Visit: Payer: Self-pay | Admitting: *Deleted

## 2022-08-23 DIAGNOSIS — C3490 Malignant neoplasm of unspecified part of unspecified bronchus or lung: Secondary | ICD-10-CM

## 2022-08-23 NOTE — Progress Notes (Signed)
Gina Cervantes called to cancel her CT scan at [facility] and request [facility] due to lesser cost. Canceled [facility] scan and entered new order for [facility]. She said she will call to schedule it herself.

## 2022-08-23 NOTE — Telephone Encounter (Signed)
Rx was refilled for pt. Closing encounter.

## 2022-09-18 ENCOUNTER — Other Ambulatory Visit (HOSPITAL_BASED_OUTPATIENT_CLINIC_OR_DEPARTMENT_OTHER): Payer: BC Managed Care – PPO

## 2022-09-20 ENCOUNTER — Ambulatory Visit: Payer: BC Managed Care – PPO | Admitting: Oncology

## 2022-10-03 ENCOUNTER — Ambulatory Visit
Admission: RE | Admit: 2022-10-03 | Discharge: 2022-10-03 | Disposition: A | Discharge status: Home / Self Care | Payer: BC Managed Care – PPO | Source: Ambulatory Visit | Attending: Oncology | Admitting: Oncology

## 2022-10-03 DIAGNOSIS — Z853 Personal history of malignant neoplasm of breast: Secondary | ICD-10-CM | POA: Diagnosis not present

## 2022-10-03 DIAGNOSIS — R918 Other nonspecific abnormal finding of lung field: Secondary | ICD-10-CM | POA: Diagnosis not present

## 2022-10-03 DIAGNOSIS — J90 Pleural effusion, not elsewhere classified: Secondary | ICD-10-CM | POA: Diagnosis not present

## 2022-10-03 DIAGNOSIS — C3490 Malignant neoplasm of unspecified part of unspecified bronchus or lung: Secondary | ICD-10-CM

## 2022-10-03 DIAGNOSIS — C3401 Malignant neoplasm of right main bronchus: Secondary | ICD-10-CM | POA: Diagnosis not present

## 2022-10-05 ENCOUNTER — Inpatient Hospital Stay: Payer: BC Managed Care – PPO | Attending: Oncology | Admitting: Oncology

## 2022-10-05 VITALS — BP 118/71 | HR 87 | Temp 98.1°F | Resp 16 | Ht 65.0 in | Wt 154.3 lb

## 2022-10-05 DIAGNOSIS — D490 Neoplasm of unspecified behavior of digestive system: Secondary | ICD-10-CM

## 2022-10-05 DIAGNOSIS — Z85048 Personal history of other malignant neoplasm of rectum, rectosigmoid junction, and anus: Secondary | ICD-10-CM | POA: Insufficient documentation

## 2022-10-05 DIAGNOSIS — C3412 Malignant neoplasm of upper lobe, left bronchus or lung: Secondary | ICD-10-CM

## 2022-10-05 DIAGNOSIS — Z853 Personal history of malignant neoplasm of breast: Secondary | ICD-10-CM | POA: Insufficient documentation

## 2022-10-05 MED ORDER — ALBUTEROL SULFATE HFA 108 (90 BASE) MCG/ACT IN AERS: INHALATION_SPRAY | RESPIRATORY_TRACT | 1 refills | Status: DC

## 2022-10-05 MED ORDER — LORAZEPAM 0.5 MG PO TABS: 0.5000 mg | ORAL_TABLET | Freq: Three times a day (TID) | ORAL | 0 refills | Status: DC | PRN

## 2022-10-05 NOTE — Progress Notes (Signed)
[facility] OFFICE PROGRESS NOTE   Diagnosis: Non-small cell lung cancer  INTERVAL HISTORY:   Ms. Ranta returns as scheduled.  She generally feels well.  She reports mild dyspnea.  She continues inhalers.  She reports mild soreness at the right upper breast for the past few months.  The soreness is inferior to the infected Port-A-Cath site.  She feels fullness in this area.  A mammogram in October revealed a possible asymmetry in the right breast.  On a diagnostic mammogram 03/01/2022 the asymmetry was felt to be scarring/fibrosis related to the history of a Port-A-Cath infection.  Objective:  Vital signs in last 24 hours:  Blood pressure 118/71, pulse 87, temperature 98.1 F (36.7 C), temperature source Oral, resp. rate 16, height 5\' 5"  (1.651 m), weight 154 lb 4.8 oz (70 kg), last menstrual period 04/07/2008, SpO2 98 %.    HEENT: Neck without mass Lymphatics: No cervical, supraclavicular, axillary, or inguinal nodes Resp: Clear bilaterally with decreased breath sounds at the right compared to the left chest, no respiratory distress Cardio: Regular rate and rhythm GI: Nontender, no mass, no hepatosplenomegaly Vascular: No leg edema Breasts: The area of tenderness is at the upper mid and lateral right breast inferior to the previous right chest Port-A-Cath site.  There is mild thickening in the subcutaneous tissue at the Port-A-Cath site and upper anterior breast.  No discrete mass.  The right axilla appears benign.  No mass in the right breast.  Lab Results:  Lab Results  Component Value Date   WBC 5.8 08/08/2021   HGB 14.0 08/08/2021   HCT 41.9 08/08/2021   MCV 102.4 (H) 08/08/2021   PLT 224 08/08/2021   NEUTROABS 3.9 08/08/2021    CMP  Lab Results  Component Value Date   NA 136 08/08/2021   K 4.0 08/08/2021   CL 94 (L) 08/08/2021   CO2 31 08/08/2021   GLUCOSE 117 (H) 08/08/2021   BUN 16 08/08/2021   CREATININE 0.98 08/08/2021   CALCIUM 9.5  08/08/2021   PROT 7.8 08/08/2021   ALBUMIN 3.6 08/08/2021   AST 23 08/08/2021   ALT 12 08/08/2021   ALKPHOS 68 08/08/2021   BILITOT 0.5 08/08/2021   GFRNONAA >60 08/08/2021   GFRAA >60 01/29/2020    Lab Results  Component Value Date   CEA 5.5 (H) 08/02/2012    Imaging:  CT Chest Wo Contrast  Result Date: 10/04/2022 CLINICAL DATA:  Non-small cell lung cancer restaging. Also history of right breast cancer. * Tracking Code: BO * EXAM: CT CHEST WITHOUT CONTRAST TECHNIQUE: Multidetector CT imaging of the chest was performed following the standard protocol without IV contrast. RADIATION DOSE REDUCTION: This exam was performed according to the departmental dose-optimization program which includes automated exposure control, adjustment of the mA and/or kV according to patient size and/or use of iterative reconstruction technique. COMPARISON:  06/11/2022 FINDINGS: Cardiovascular: Coronary, aortic arch, and branch vessel atherosclerotic vascular disease. Stable mildly prominent main pulmonary artery compatible with pulmonary arterial hypertension. Mediastinum/Nodes: Right paratracheal lymph node 1.0 cm in short axis on image 51 series 2, stable. Right hilar node 1.2 cm in short axis on image 67 series 2, previously about 1.0 cm. Lungs/Pleura: Trace right pleural effusions similar prior. The right suprahilar mass measures 3.3 by 2.5 cm on image 43 series 3, previously 3.2 by 2.5 cm 08/18/2022, essentially stable with respect to morphology. There is a band of density anterior to this mass probably from radiation pneumonitis as on image 46 series  3, with associated air bronchograms. At the site of treatment of the prior subpleural nodule in the right upper lobe shown on image 78 of series 4 of the 02/17/2022 exam, we demonstrate progressive subpleural consolidation on images 63 through 76 of series 3, extending from the right upper lobe and to a lesser extent along the right middle lobe in the subpleural  region, with associated air bronchograms and without a distinct residual individually perceptible nodule. The appearance is most compatible with radiation pneumonitis. Surveillance suggested. The right infrahilar masslike density measures 4.2 by 2.9 cm on image 89 series 3, previously 4.4 by 3.0 cm when measured in the same fashion, hence similar to minimally reduced. There are some associated air bronchograms within this area of density/consolidation, as well as some bandlike bronchiectasis and volume loss in the right middle lobe anterior to this mass probably reflecting radiation therapy related findings. Wedge resection related findings in the left upper lobe. Proximally 1.3 cm subsolid nodule in the left upper lobe on image 74 series 3, previously 1.2 cm by my measurement. 1.5 by 1.2 cm subsolid nodule in the left upper lobe on image 69 series 3, previously the same by my measurements. A 0.6 cm and separate 0.7 cm subsolid nodule in the left upper lobe on image 65 of series 3 both new compared to the prior exam and most likely inflammatory but merit surveillance. 1.5 by 1.0 cm subsolid nodule in the left lower lobe on image 95 series 3, stable by my measurements. Upper Abdomen: Unremarkable Musculoskeletal: Unremarkable IMPRESSION: 1. The dominant right suprahilar and infrahilar masslike densities are essentially stable compared to previous. 2. Subpleural consolidation in the right upper lobe at the site of previous radiation therapy for a subpleural nodule. Associated airspace opacities increased in likely from radiation pneumonitis. Discrete nodule not visible against this backdrop. 3. There are 2 new small subsolid nodules in the left upper lobe which are probably inflammatory but merit surveillance. 4. Multiple stable subsolid nodules in the left upper lobe and left lower lobe, low-grade multifocal adenocarcinoma is a distinct possibility. 5. Stable trace right pleural effusion. 6. Stable borderline  enlarged right paratracheal lymph node. Stable mildly enlarged right hilar lymph node. 7. Aortic and coronary atherosclerosis. Aortic Atherosclerosis (ICD10-I70.0). Electronically Signed   By: Gaylyn Rong M.D.   On: 10/04/2022 18:12    Medications: I have reviewed the patient's current medications.   Assessment/Plan:  Anal cancer-left anal margin/anal canal mass, status post a biopsy on 08/09/2012 confirming invasive moderately differentiated squamous cell carcinoma,p16 positive.   -Staging PET scan 08/20/2012-hypermetabolic anal mass, no hypermetabolic metastases   -Initiation of concurrent radiation with cycle 1 of mitomycin C./5-fluorouracil 09/02/2012.   -She completed cycle 2 5-fluorouracil/mitomycin C. beginning 10/01/2012.   -She completed radiation 10/10/2012.   2. Right breast cancer 2009, ER positive, PR positive, HER-2 positive. She is status post neoadjuvant AC x4 cycles followed by Taxol/Herceptin. She underwent a right lumpectomy and sentinel lymph node biopsy 09/22/2008 with no evidence of residual invasive carcinoma and 5 negative sentinel lymph nodes. She then completed right breast radiation. She began tamoxifen 12/15/2008 and completed adjuvant Herceptin 05/25/2009. The tamoxifen was discontinued and she was switched to Arimidex in July 2013.  Arimidex discontinued in mid July 2019. 3. History of enlargement of the right tonsil.   4. Nonspecific pulmonary nodules without hypermetabolic activity on the PET scan 08/20/2012. Chest CT 10/21/2012 with scattered pulmonary nodules including nodules that were not present in 2010. Annual surveillance was recommended. Follow-up  chest CT 10/24/2013 with scattered groundglass densities again noted in both lungs mostly unchanged when compared to the previous study. A sub-solid right lower lobe nodule had increased central density. The size was unchanged.The nodule has been present since 2009. CT chest 06/13/2016-a sub-solid 9 mm right  upper lobe nodule has enlarged compared to 2015, a right lower lobe irregular nodular density appears more well defined, new vague 5 mm groundglass left upper lobe nodule, left lower lobe 10 mm groundglass nodule is more apparent, posterior left upper lobe circumscribed solid nodule measures 11 x 10 mm and is new PET scan 06/22/2016-malignant range activity involving the 11 mm left upper lobe nodule, no other hypermetabolic nodules, suspicious sub-solid right lower lobe nodule Wedge resection of a hypermetabolic left upper lobe nodule on 07/05/2016-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 No EGFR, ALK, BRAF, RET, ERBB2, or ROS1 alteration CT chest 11/14/2016-status post left upper lobe wedge resection, stable right lung nodules Chest CT 05/02/2017-stable bilateral solid and groundglass nodules, stable mild mediastinal lymphadenopathy CT chest 12/13/2017- enlargement of right upper lobe nodule, other lung nodules and chest lymph node stable PET 01/09/2018- right upper lobe nodule and right lower lobe nodule have enlarged since 2018 and have low level metabolic activity increased size of an 8 mm right upper lobe nodule without increased metabolic activity, stable activity and a mildly enlarged lower paratracheal node, no evidence of metastatic disease in the abdomen or pelvis CT chest 04/22/2018- no new lung nodules, dominant right lung nodules slightly increased in size Bronchoscopy/EBUS 05/04/2019 with biopsy of level 7 node, biopsy of right lower lobe nodule, FNA of right lower lobe nodule-negative CT biopsy of right upper lobe nodule 06/07/2018- adenocarcinoma with lepidic and acinar patterns SBRT to the dominant right upper lobe nodule 07/23/2018-07/30/2018 CT 09/16/2018-slight increase in size of medial right upper lobe nodule-possibly secondary to interval radiation, other nodules  are unchanged CT 12/25/2018- medial right upper lobe nodule has decreased in size slightly, multiple additional solid and groundglass nodules are stable CT 06/09/2019-enlarging right lower lobe nodule, enlarging subsolid nodule, new right upper lobe nodule SBRT to right lower lobe lung nodule, 3 fractions 07/08/2019-07/14/2019 CT 12/05/2019-dominant right lower lobe lesion stable to slightly decreased in size, progressive solid component associated with a dominant right upper lung nodule, mild progression of a left upper lobe nodule, new subsolid anterior medial left upper lobe nodule Cycle 1 Alimta/carboplatin/pembrolizumab 02/04/2020 Cycle 2 Alimta/carboplatin/pembrolizumab 02/23/2020 Cycle 3 Alimta/carboplatin/pembrolizumab 03/15/2020 Cycle 4 Alimta/carboplatin/pembrolizumab 04/05/2020 (Alimta and carboplatin dose reduced secondary to anemia) CT chest 04/12/2020-decreased size of solid right lower lobe and right upper lobe nodules, groundglass nodules in the left lung unchanged Cycle 5 Alimta/pembrolizumab 05/03/2020 Cycle 6 Alimta/pembrolizumab 05/21/2020 Cycle 7 Alimta/pembrolizumab 06/14/2020 Cycle 8 Alimta/pembrolizumab 07/05/2020 CT chest 07/22/2020-stable nodules, no evidence of disease progression, unchanged multifocal groundglass nodules suspicious for multifocal adenocarcinoma. Cycle 9 Alimta/Pembrolizumab 07/26/2020 Cycle 10 Alimta/Pembrolizumab 08/16/2020 Cycle 11 Alimta/Pembrolizumab 09/06/2020 Cycle 12 Alimta/pembrolizumab 09/27/2020 Cycle 13 Alimta/pembrolizumab 10/18/2020 CT chest 11/12/2020-enlarging soft tissue density right upper lobe medially adjacent to the mediastinum measuring 2.7 x 2.1 cm, concerning for recurrent tumor.  Stable radiation changes involving the right upper lobe and right lower lobe.  Stable scattered small solid and subsolid pulmonary nodules bilaterally.  New right-sided pleural effusion.  Stable borderline enlarged mediastinal lymph nodes. Bronchoscopy 12/28/2020-right upper  lobe and left lower lobe lesions biopsy-negative pathology PET 12/29/2020-low metabolic activity involving the right suprahilar  consolidative process-favored benign, measured smaller, additional groundglass densities with very low-level metabolic activity, radiation change in the right lower lobe, resolution of right pleural effusion, no distant metastatic disease Cycle 14 Alimta/pembrolizumab 01/19/2021 Cycle 15 Alimta/pembrolizumab 02/07/2021 Cycle 16 of Alimta/pembrolizumab 02/28/2021 Cycle 17 Alimta/pembrolizumab 03/28/2021 CT chest 04/18/2021-increased areas of masslike density in the right upper lobe and right lower lobe with a new right lower lobe satellite nodule, other groundglass attenuation lesions are stable, slight decrease in size of chronic right pleural effusion CT head 05/06/2021-no evidence of metastatic disease CT chest 05/20/2018-right upper lobe and right lower lobe masses are unchanged, groundglass nodules in the left lung are unchanged, no mediastinal adenopathy, resist measurements of target lesions given 05/30/2021 SWOG [id number]-sotorasib Sotorasib placed on hold 06/30/2021 CT chest 07/07/2021-no change in right suprahilar/paramediastinal density, stable right lower lobe nodularity with adjacent bandlike density, stable groundglass left lung nodules CT chest 10/27/2021-mild increase in density of the right infrahilar mass, no change in size, new 5 mm right upper lobe subpleural nodule, stable left lung nodules CT chest 02/17/2022-enlargement of small mediastinal nodes, stable posttreatment changes at the central right upper lobe, right lower lobe masslike density slightly larger, subpleural right upper lobe pulmonary nodule larger, slight enlargement of groundglass lesion in the left lower lobe PET 03/02/2022-hypermetabolic right upper lobe subpleural nodule, mild tracer activity in treated tumor in the right upper lobe and right lower lobe-posttreatment change?,  Mild increased activity  associated with right paratracheal and subcarinal nodes, chronic loculated right pleural effusion with mild tracer uptake, subsolid left lung nodules with mild tracer uptake, no evidence of metastatic disease to the abdomen or pelvis SBRT to right subpleural mass 04/04/2022 - 04/11/2022, 54 Gray in 3 fractions CT chest 06/11/2022-stable right suprahilar and right infrahilar masses, stable small right pleural effusion, stable subsolid nodules in the left upper lobe and left lower lobe, right subpleural mass appears smaller with new inflammatory changes by my review CT chest 10/03/2022-stable right suprahilar infrahilar masslike densities and subpleural consolidation at the right upper lobe site of previous radiation, new small subsolid nodules in the left upper lobe felt to most likely be inflammatory, stable borderline enlarged right paratracheal and mildly enlarged right hilar lymph nodes, stable trace right pleural effusion 5.  Baker's cyst left leg, pain and swelling in the left calf, negative Doppler 01/24/2020 and 01/29/2020-evaluated by orthopedics and underwent aspiration of the cyst 01/29/2000, completed a course of Keflex 6.  Anemia secondary to chemotherapy, status post 2 units of packed red blood cells on 04/10/2020-improved 7.  Port-A-Cath placement 01/12/2021; Port-A-Cath removal 02/24/2021 due to infection, culture positive for MRSA, status post 2 courses of doxycycline, Bactrim DS started 03/28/2021, Bactrim DS resumed 04/26/2021 8.  Diarrhea beginning 06/21/2021-sotorasib discontinued 07/01/2021, resolved        Disposition: [name] appears stable.  The restaging chest CT reveals generally stable disease with 2 new small inflammatory appearing lesions in the left lung.  These could represent early adenocarcinomas.  The plan is to continue observation.  She will return for an office visit and chest CT in 3 months.  She has tenderness at the upper right breast near the area of the Port-A-Cath  infection.  I suspect this is a benign finding.  There is no palpable mass or suspicious skin finding on exam today.  We decided to continue observation.  She will contact us if the discomfort increases or if she develops a palpable change.  I reviewed the chest CT findings and images with Ms. Bogert  and her husband.  Thornton Papas, MD  10/05/2022  1:14 PM

## 2022-12-12 ENCOUNTER — Other Ambulatory Visit: Payer: Self-pay | Admitting: Oncology

## 2022-12-25 ENCOUNTER — Telehealth (HOSPITAL_BASED_OUTPATIENT_CLINIC_OR_DEPARTMENT_OTHER): Payer: Self-pay | Admitting: *Deleted

## 2022-12-25 NOTE — Telephone Encounter (Signed)
Pt called c/o of labial bumps.  Pt has personal h/o HPV, anal cancer and other cancers.  Pt is worried about these bumps.  Discussed with Dr Hyacinth Meeker to determine best place to see pt.  Called pt and offered apt for 8/21 at 8am.  Pt agreed and apt made. KW CMA

## 2022-12-27 ENCOUNTER — Ambulatory Visit (INDEPENDENT_AMBULATORY_CARE_PROVIDER_SITE_OTHER): Payer: BC Managed Care – PPO | Admitting: Obstetrics & Gynecology

## 2022-12-27 ENCOUNTER — Encounter (HOSPITAL_BASED_OUTPATIENT_CLINIC_OR_DEPARTMENT_OTHER): Payer: Self-pay | Admitting: Obstetrics & Gynecology

## 2022-12-27 VITALS — BP 115/74 | HR 106 | Ht 65.5 in | Wt 152.4 lb

## 2022-12-27 DIAGNOSIS — L723 Sebaceous cyst: Secondary | ICD-10-CM

## 2022-12-27 NOTE — Progress Notes (Signed)
GYNECOLOGY  VISIT  CC:   vulvar check  HPI: 62 y.o. [id number] Married White or Caucasian female here for complaint of irregular feeling area along left inferior labia majora.  This has been present about three weeks.  No bleeding.  No discharge.  No pain.  H/o anal carcinoma so is concerned.  .   Past Medical History:  Diagnosis Date   Anal cancer (HCC) 08/09/2012   Squamous cell   Anxiety    PANIC ATTACKS   Arthritis    Breast cancer (HCC) 2009   ER+PR+HER-2+   Ductal carcinoma (HCC) 02/2008   invasive    Heart palpitations    History of radiation therapy 09/02/12-10/10/12   anal 50.4Gy   History of shingles 10/2014   HPV (human papilloma virus) anogenital infection    vulvar/ freezing hx   Hx of radiation therapy 2020   Lung nodule    Malignant neoplasm of upper lobe of left lung (HCC) 06/2016   Personal history of chemotherapy    Personal history of radiation therapy 2010   Pneumonia    NO RECENT PROBLEMS   PONV (postoperative nausea and vomiting)    Hypotension   Radiation 10/21/08-12/08/08   Right breast 6240 cGy   Shingles 2017   Status post chemotherapy 02/2008   Taxol/Herceptin    MEDS:   Current Outpatient Medications on File Prior to Visit  Medication Sig Dispense Refill   albuterol (VENTOLIN HFA) 108 (90 Base) MCG/ACT inhaler INHALE 2 PUFFS BY MOUTH EVERY 6 HOURS AS NEEDED FOR WHEEZING OR SHORTNESS OF BREATH 8.5 g 1   Budeson-Glycopyrrol-Formoterol (BREZTRI AEROSPHERE) 160-9-4.8 MCG/ACT AERO Inhale 2 puffs into the lungs in the morning and at bedtime. 10.7 g 6   CALCIUM PO Take 1,200 mg by mouth daily. D3     loratadine (CLARITIN) 10 MG tablet Take 10 mg by mouth as needed for allergies.     LORazepam (ATIVAN) 0.5 MG tablet Take 1 tablet (0.5 mg total) by mouth every 8 (eight) hours as needed for anxiety (or nausea). 60 tablet 0   No current facility-administered medications on file prior to visit.    ALLERGIES: No known allergies  SH:  married, non  smoker  Review of Systems  Constitutional: Negative.   Genitourinary: Negative.     PHYSICAL EXAMINATION:    BP 115/74 (BP Location: Left Arm, Patient Position: Sitting, Cuff Size: Normal)   Pulse (!) 106   Ht 5' 5.5" (1.664 m) Comment: Reported  Wt 152 lb 6.4 oz (69.1 kg)   LMP 04/07/2008   BMI 24.97 kg/m     General appearance: alert, cooperative and appears stated age Lymph:  no inguinal LAD noted  Pelvic: External genitalia:  three or four very small raised               Urethra:  normal appearing urethra with no masses, tenderness or lesions              Bartholins and Skenes: normal                   Assessment/Plan: 1. Sebaceous cyst - pt reassured this is a benign finding.  No treatment needed at this time.

## 2022-12-28 ENCOUNTER — Other Ambulatory Visit: Payer: Self-pay | Admitting: Medical Genetics

## 2022-12-28 DIAGNOSIS — Z006 Encounter for examination for normal comparison and control in clinical research program: Secondary | ICD-10-CM

## 2023-01-01 ENCOUNTER — Ambulatory Visit
Admission: RE | Admit: 2023-01-01 | Discharge: 2023-01-01 | Disposition: A | Discharge status: Home / Self Care | Payer: BC Managed Care – PPO | Source: Ambulatory Visit | Attending: Oncology | Admitting: Oncology

## 2023-01-01 DIAGNOSIS — I700 Atherosclerosis of aorta: Secondary | ICD-10-CM | POA: Diagnosis not present

## 2023-01-01 DIAGNOSIS — J90 Pleural effusion, not elsewhere classified: Secondary | ICD-10-CM | POA: Diagnosis not present

## 2023-01-01 DIAGNOSIS — R918 Other nonspecific abnormal finding of lung field: Secondary | ICD-10-CM | POA: Diagnosis not present

## 2023-01-01 DIAGNOSIS — C3412 Malignant neoplasm of upper lobe, left bronchus or lung: Secondary | ICD-10-CM

## 2023-01-04 ENCOUNTER — Inpatient Hospital Stay: Payer: BC Managed Care – PPO | Attending: Oncology | Admitting: Oncology

## 2023-01-04 VITALS — BP 115/72 | HR 108 | Temp 98.1°F | Resp 18 | Ht 65.0 in | Wt 153.6 lb

## 2023-01-04 DIAGNOSIS — C3490 Malignant neoplasm of unspecified part of unspecified bronchus or lung: Secondary | ICD-10-CM

## 2023-01-04 DIAGNOSIS — R918 Other nonspecific abnormal finding of lung field: Secondary | ICD-10-CM | POA: Insufficient documentation

## 2023-01-04 DIAGNOSIS — Z85118 Personal history of other malignant neoplasm of bronchus and lung: Secondary | ICD-10-CM | POA: Insufficient documentation

## 2023-01-04 DIAGNOSIS — R0600 Dyspnea, unspecified: Secondary | ICD-10-CM | POA: Insufficient documentation

## 2023-01-04 NOTE — Progress Notes (Signed)
[facility] OFFICE PROGRESS NOTE   Diagnosis: Non-small cell lung cancer  INTERVAL HISTORY:   Gina Cervantes returns as scheduled.  She generally feels well.  She has increased exertional dyspnea in the hot weather.  She reports her home pulse oximeter typically runs in the low 90s.  Objective:  Vital signs in last 24 hours:  Blood pressure 115/72, pulse (!) 118, temperature 98.1 F (36.7 C), temperature source Temporal, resp. rate 18, height 5\' 5"  (1.651 m), weight 153 lb 9.6 oz (69.7 kg), last menstrual period 04/07/2008, SpO2 (!) 82%.     Lymphatics: No cervical, supraclavicular, or axillary nodes Resp: Decreased breath sounds throughout the right compared to the left chest, no respiratory distress Cardio: Regular rate and rhythm GI: No hepatosplenomegaly Vascular: Trace edema at the left lower leg and ankle, no erythema or palpable cord     Lab Results:  Lab Results  Component Value Date   WBC 5.8 08/08/2021   HGB 14.0 08/08/2021   HCT 41.9 08/08/2021   MCV 102.4 (H) 08/08/2021   PLT 224 08/08/2021   NEUTROABS 3.9 08/08/2021    CMP  Lab Results  Component Value Date   NA 136 08/08/2021   K 4.0 08/08/2021   CL 94 (L) 08/08/2021   CO2 31 08/08/2021   GLUCOSE 117 (H) 08/08/2021   BUN 16 08/08/2021   CREATININE 0.98 08/08/2021   CALCIUM 9.5 08/08/2021   PROT 7.8 08/08/2021   ALBUMIN 3.6 08/08/2021   AST 23 08/08/2021   ALT 12 08/08/2021   ALKPHOS 68 08/08/2021   BILITOT 0.5 08/08/2021   GFRNONAA >60 08/08/2021   GFRAA >60 01/29/2020    Lab Results  Component Value Date   CEA 5.5 (H) 08/02/2012    Lab Results  Component Value Date   INR 1.04 06/07/2018   LABPROT 13.5 06/07/2018    Imaging:  CT CHEST WO CONTRAST  Result Date: 01/03/2023 CLINICAL DATA:  Pulmonary nodules. EXAM: CT CHEST WITHOUT CONTRAST TECHNIQUE: Multidetector CT imaging of the chest was performed following the standard protocol without IV contrast. RADIATION DOSE  REDUCTION: This exam was performed according to the departmental dose-optimization program which includes automated exposure control, adjustment of the mA and/or kV according to patient size and/or use of iterative reconstruction technique. COMPARISON:  10/03/2022 FINDINGS: Cardiovascular: The heart size is normal. No substantial pericardial effusion. Coronary artery calcification is evident. Mild atherosclerotic calcification is noted in the wall of the thoracic aorta. Enlargement of the pulmonary outflow tract/main pulmonary arteries suggests pulmonary arterial hypertension. Mediastinum/Nodes: Index right paratracheal node as increase slightly in size in the interval measuring 1.3 cm short axis today compared to 1.0 cm previously. Index right hilar node measured previously at 1.2 cm is 1.4 cm today on image 176/6. 1.1 cm short axis subcarinal lymph node seen on image 197/6. The esophagus has normal imaging features. There is no axillary lymphadenopathy. Lungs/Pleura: Insert emphysema paraseptal architectural distortion and scarring in the right apex is similar. Nodular component measured previously at 3.3 x 2.5 cm is 3.3 x 3.4 cm today on image 48/5. Subpleural consolidative opacity in the anterolateral right lung is progressive in the interval, notably along the posterior aspect of this subpleural opacity (compare image 76/5 today to image 73/3 previously. The right infrahilar masslike opacity measured previously at 4.2 x 2.9 cm is 4.2 x 3.0 cm today (93/5). Scattered areas of ground-glass opacity in the left lower lobe are similar to prior including 1.5 cm index lesion measured previously and  stable on image 95/5 today. Parahilar ground-glass opacity measured previously at 1.3 cm is 1.2 cm today on 76/5. Additional scattered left upper lung pulmonary nodules are similar. Surgical staple line noted left upper lung, as before. Chronic small right pleural effusion is similar with trace fluid in the major fissure,  slightly increased in the interval. Upper Abdomen: Visualized portion of the upper abdomen is unremarkable. Musculoskeletal: No worrisome lytic or sclerotic osseous abnormality. IMPRESSION: 1. Interval progression of subpleural consolidative opacity in the anterolateral right lung, notably along the posterior aspect of this subpleural opacity. Imaging features are concerning for progressive malignancy although infection or drug toxicity could have this appearance. 2. Stable right infrahilar masslike opacity and no substantial change in the right suprahilar masslike opacity. 3. Stable scattered areas of ground-glass opacity in the left lung. 4. Slight interval increase in size of mediastinal and right hilar lymph nodes. 5. Chronic small right pleural effusion with trace fluid in the major fissure, slightly increased in the interval. 6. Aortic Atherosclerosis (ICD10-I70.0) and Emphysema (ICD10-J43.9). Electronically Signed   By: Kennith Center M.D.   On: 01/03/2023 16:15    Medications: I have reviewed the patient's current medications.   Assessment/Plan: Anal cancer-left anal margin/anal canal mass, status post a biopsy on 08/09/2012 confirming invasive moderately differentiated squamous cell carcinoma,p16 positive.   -Staging PET scan 08/20/2012-hypermetabolic anal mass, no hypermetabolic metastases   -Initiation of concurrent radiation with cycle 1 of mitomycin C./5-fluorouracil 09/02/2012.   -She completed cycle 2 5-fluorouracil/mitomycin C. beginning 10/01/2012.   -She completed radiation 10/10/2012.   2. Right breast cancer 2009, ER positive, PR positive, HER-2 positive. She is status post neoadjuvant AC x4 cycles followed by Taxol/Herceptin. She underwent a right lumpectomy and sentinel lymph node biopsy 09/22/2008 with no evidence of residual invasive carcinoma and 5 negative sentinel lymph nodes. She then completed right breast radiation. She began tamoxifen 12/15/2008 and completed adjuvant  Herceptin 05/25/2009. The tamoxifen was discontinued and she was switched to Arimidex in July 2013.  Arimidex discontinued in mid July 2019. 3. History of enlargement of the right tonsil.   4. Nonspecific pulmonary nodules without hypermetabolic activity on the PET scan 08/20/2012. Chest CT 10/21/2012 with scattered pulmonary nodules including nodules that were not present in 2010. Annual surveillance was recommended. Follow-up chest CT 10/24/2013 with scattered groundglass densities again noted in both lungs mostly unchanged when compared to the previous study. A sub-solid right lower lobe nodule had increased central density. The size was unchanged.The nodule has been present since 2009. 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 No EGFR, ALK, BRAF, RET, ERBB2, or ROS1 alteration CT chest 11/14/2016-status post left upper lobe wedge resection, stable right lung nodules Chest CT 05/02/2017-stable bilateral solid and groundglass nodules, stable mild mediastinal lymphadenopathy CT chest 12/13/2017- enlargement of right upper lobe nodule, other lung nodules and chest lymph node stable  PET 01/09/2018- right upper lobe nodule and right lower lobe nodule have enlarged since 2018 and have low level  metabolic activity increased size of an 8 mm right upper lobe nodule without increased metabolic activity, stable activity and a mildly enlarged lower paratracheal node, no evidence of metastatic disease in the abdomen or pelvis CT chest 04/22/2018- no new lung nodules, dominant right lung nodules slightly increased in size Bronchoscopy/EBUS 05/04/2019 with biopsy of level 7 node, biopsy of right lower lobe nodule, FNA of right lower lobe nodule-negative CT biopsy of right upper lobe nodule 06/07/2018- adenocarcinoma with lepidic and acinar patterns SBRT to the dominant right upper lobe nodule 07/23/2018-07/30/2018 CT 09/16/2018-slight increase in size of medial right upper lobe nodule-possibly secondary to interval radiation, other nodules are unchanged CT 12/25/2018- medial right upper lobe nodule has decreased in size slightly, multiple additional solid and groundglass nodules are stable CT 06/09/2019-enlarging right lower lobe nodule, enlarging subsolid nodule, new right upper lobe nodule SBRT to right lower lobe lung nodule, 3 fractions 07/08/2019-07/14/2019 CT 12/05/2019-dominant right lower lobe lesion stable to slightly decreased in size, progressive solid component associated with a dominant right upper lung nodule, mild progression of a left upper lobe nodule, new subsolid anterior medial left upper lobe nodule Cycle 1 Alimta/carboplatin/pembrolizumab 02/04/2020 Cycle 2 Alimta/carboplatin/pembrolizumab 02/23/2020 Cycle 3 Alimta/carboplatin/pembrolizumab 03/15/2020 Cycle 4 Alimta/carboplatin/pembrolizumab 04/05/2020 (Alimta and carboplatin dose reduced secondary to anemia) CT chest 04/12/2020-decreased size of solid right lower lobe and right upper lobe nodules, groundglass nodules in the left lung unchanged Cycle 5 Alimta/pembrolizumab 05/03/2020 Cycle 6 Alimta/pembrolizumab 05/21/2020 Cycle 7 Alimta/pembrolizumab 06/14/2020 Cycle 8 Alimta/pembrolizumab 07/05/2020 CT chest 07/22/2020-stable nodules, no  evidence of disease progression, unchanged multifocal groundglass nodules suspicious for multifocal adenocarcinoma. Cycle 9 Alimta/Pembrolizumab 07/26/2020 Cycle 10 Alimta/Pembrolizumab 08/16/2020 Cycle 11 Alimta/Pembrolizumab 09/06/2020 Cycle 12 Alimta/pembrolizumab 09/27/2020 Cycle 13 Alimta/pembrolizumab 10/18/2020 CT chest 11/12/2020-enlarging soft tissue density right upper lobe medially adjacent to the mediastinum measuring 2.7 x 2.1 cm, concerning for recurrent tumor.  Stable radiation changes involving the right upper lobe and right lower lobe.  Stable scattered small solid and subsolid pulmonary nodules bilaterally.  New right-sided pleural effusion.  Stable borderline enlarged mediastinal lymph nodes. Bronchoscopy 12/28/2020-right upper lobe and left lower lobe lesions biopsy-negative pathology PET 12/29/2020-low metabolic activity involving the right suprahilar consolidative process-favored benign, measured smaller, additional groundglass densities with very low-level metabolic activity, radiation change in the right lower lobe, resolution of right pleural effusion, no distant metastatic disease Cycle 14 Alimta/pembrolizumab 01/19/2021 Cycle 15 Alimta/pembrolizumab 02/07/2021 Cycle 16 of Alimta/pembrolizumab 02/28/2021 Cycle 17 Alimta/pembrolizumab 03/28/2021 CT chest 04/18/2021-increased areas of masslike density in the right upper lobe and right lower lobe with a new right lower lobe satellite nodule, other groundglass attenuation lesions are stable, slight decrease in size of chronic right pleural effusion CT head 05/06/2021-no evidence of metastatic disease CT chest 05/20/2018-right upper lobe and right lower lobe masses are unchanged, groundglass nodules in the left lung are unchanged, no mediastinal adenopathy, resist measurements of target lesions given 05/30/2021 SWOG [id number]-sotorasib Sotorasib placed on hold 06/30/2021 CT chest 07/07/2021-no change in right suprahilar/paramediastinal density,  stable right lower lobe nodularity with adjacent bandlike density, stable groundglass left lung nodules CT chest 10/27/2021-mild increase in density of the right infrahilar mass, no change in size, new 5 mm right upper lobe subpleural nodule, stable left lung nodules CT chest 02/17/2022-enlargement of small mediastinal nodes, stable posttreatment changes at the central right upper lobe, right lower lobe masslike density slightly larger, subpleural right upper lobe pulmonary nodule larger,  slight enlargement of groundglass lesion in the left lower lobe PET 03/02/2022-hypermetabolic right upper lobe subpleural nodule, mild tracer activity in treated tumor in the right upper lobe and right lower lobe-posttreatment change?,  Mild increased activity associated with right paratracheal and subcarinal nodes, chronic loculated right pleural effusion with mild tracer uptake, subsolid left lung nodules with mild tracer uptake, no evidence of metastatic disease to the abdomen or pelvis SBRT to right subpleural mass 04/04/2022 - 04/11/2022, 54 Gray in 3 fractions CT chest 06/11/2022-stable right suprahilar and right infrahilar masses, stable small right pleural effusion, stable subsolid nodules in the left upper lobe and left lower lobe, right subpleural mass appears smaller with new inflammatory changes by my review CT chest 10/03/2022-stable right suprahilar infrahilar masslike densities and subpleural consolidation at the right upper lobe site of previous radiation, new small subsolid nodules in the left upper lobe felt to most likely be inflammatory, stable borderline enlarged right paratracheal and mildly enlarged right hilar lymph nodes, stable trace right pleural effusion CT chest 01/01/2023-progression of subpleural consolidative opacity in the anterolateral right lung, stable right infrahilar and suprahilar masslike opacity, stable areas of groundglass opacity in the left lung, slight increase in mediastinal and right  hilar lymph nodes, slight increase in chronic small right pleural effusion 5.  Baker's cyst left leg, pain and swelling in the left calf, negative Doppler 01/24/2020 and 01/29/2020-evaluated by orthopedics and underwent aspiration of the cyst 01/29/2000, completed a course of Keflex 6.  Anemia secondary to chemotherapy, status post 2 units of packed red blood cells on 04/10/2020-improved 7.  Port-A-Cath placement 01/12/2021; Port-A-Cath removal 02/24/2021 due to infection, culture positive for MRSA, status post 2 courses of doxycycline, Bactrim DS started 03/28/2021, Bactrim DS resumed 04/26/2021 8.  Diarrhea beginning 06/21/2021-sotorasib discontinued 07/01/2021, resolved     Disposition: Gina Cervantes has a history of non-small cell lung cancer.  I reviewed the restaging CT findings and images with her.  It is unclear whether the slight increase in size of the masslike opacities in the right lung and right pleura are related to progression of cancer or radiation change. I will ask Drs. Moody and Icard to review the images.  She agrees to a restaging PET in November.  She will consider a diagnostic bronchoscopy if Dr. Tonia Brooms recommends this.  She has chronic exertional dyspnea and low oxygen saturation secondary to COPD and parenchymal disease in the right lung.  She will call for increased dyspnea.  Thornton Papas, MD  01/04/2023  11:12 AM

## 2023-01-05 ENCOUNTER — Telehealth: Payer: Self-pay | Admitting: *Deleted

## 2023-01-05 NOTE — Telephone Encounter (Signed)
Informed Annikka that Dr. Mitzi Hansen agrees that lung changes are most likely related to radiation therapy. He still agrees to do PET scan. She understands and agrees.

## 2023-01-16 ENCOUNTER — Telehealth: Payer: Self-pay | Admitting: *Deleted

## 2023-01-16 ENCOUNTER — Encounter: Payer: Self-pay | Admitting: Oncology

## 2023-01-16 DIAGNOSIS — C3490 Malignant neoplasm of unspecified part of unspecified bronchus or lung: Secondary | ICD-10-CM

## 2023-01-16 NOTE — Telephone Encounter (Signed)
Called patient back and informed her MD wants her to have venous doppler of both legs asap, but if she will not, it has been scheduled at cardiology practice at 12:00 on 9/13. Push fluids, avoid salt, walk every hour during drive and go to ER if condition worsens. Take ASA 325 mg daily as well. She agrees.

## 2023-01-16 NOTE — Telephone Encounter (Signed)
Called [facility] after receiving MyChart message describing new swelling in her RLE at ankle/foot. Started on 9/6 and does improve overnight. No pain with ambulation. No change in swelling of left ankle/foot area. Can still have some dyspnea w/exertion and oxygen sat can go down to 79%. When at rest her sat is ~ 90% and pulse is 108-110. Is concerned it could be a cardiac issue. She is leaving town in 1 hour for a 3 hour drive. Plans to stop every hour and push fluids. Does not return till late Thursday. Confirmed she will be close to emergency room if anything worsens. Wants Dr. Kalman Drape input.

## 2023-01-17 ENCOUNTER — Other Ambulatory Visit: Payer: Self-pay | Admitting: *Deleted

## 2023-01-17 DIAGNOSIS — C3490 Malignant neoplasm of unspecified part of unspecified bronchus or lung: Secondary | ICD-10-CM

## 2023-01-18 ENCOUNTER — Encounter (HOSPITAL_BASED_OUTPATIENT_CLINIC_OR_DEPARTMENT_OTHER): Payer: BC Managed Care – PPO

## 2023-01-19 ENCOUNTER — Ambulatory Visit (INDEPENDENT_AMBULATORY_CARE_PROVIDER_SITE_OTHER): Payer: BC Managed Care – PPO

## 2023-01-19 DIAGNOSIS — C3490 Malignant neoplasm of unspecified part of unspecified bronchus or lung: Secondary | ICD-10-CM

## 2023-01-31 ENCOUNTER — Encounter: Payer: Self-pay | Admitting: *Deleted

## 2023-02-08 ENCOUNTER — Telehealth: Payer: Self-pay | Admitting: *Deleted

## 2023-02-08 NOTE — Telephone Encounter (Signed)
Gina Cervantes with PET scan appointment at [facility] on 03/12/23 at 0830/0900. NPO except plain water x 6 hours prior to scan. Managed care notified.

## 2023-02-13 ENCOUNTER — Other Ambulatory Visit: Payer: Self-pay | Admitting: Pulmonary Disease

## 2023-02-17 ENCOUNTER — Other Ambulatory Visit: Payer: Self-pay | Admitting: Oncology

## 2023-03-12 ENCOUNTER — Encounter (HOSPITAL_COMMUNITY)
Admission: RE | Admit: 2023-03-12 | Discharge: 2023-03-12 | Disposition: A | Discharge status: Home / Self Care | Payer: BC Managed Care – PPO | Source: Ambulatory Visit | Attending: Oncology | Admitting: Oncology

## 2023-03-12 DIAGNOSIS — R918 Other nonspecific abnormal finding of lung field: Secondary | ICD-10-CM | POA: Diagnosis not present

## 2023-03-12 DIAGNOSIS — I700 Atherosclerosis of aorta: Secondary | ICD-10-CM | POA: Insufficient documentation

## 2023-03-12 DIAGNOSIS — C3490 Malignant neoplasm of unspecified part of unspecified bronchus or lung: Secondary | ICD-10-CM | POA: Insufficient documentation

## 2023-03-12 DIAGNOSIS — C3412 Malignant neoplasm of upper lobe, left bronchus or lung: Secondary | ICD-10-CM | POA: Insufficient documentation

## 2023-03-12 DIAGNOSIS — N200 Calculus of kidney: Secondary | ICD-10-CM | POA: Insufficient documentation

## 2023-03-12 DIAGNOSIS — R590 Localized enlarged lymph nodes: Secondary | ICD-10-CM | POA: Diagnosis not present

## 2023-03-12 LAB — GLUCOSE, CAPILLARY: Glucose-Capillary: 124 mg/dL — ABNORMAL HIGH (ref 70–99)

## 2023-03-12 MED ORDER — FLUDEOXYGLUCOSE F - 18 (FDG) INJECTION
7.6500 | Freq: Once | INTRAVENOUS | Status: AC
  Administered 2023-03-12: 7.65 via INTRAVENOUS

## 2023-03-15 ENCOUNTER — Inpatient Hospital Stay: Payer: BC Managed Care – PPO | Attending: Oncology | Admitting: Oncology

## 2023-03-15 ENCOUNTER — Inpatient Hospital Stay: Payer: BC Managed Care – PPO

## 2023-03-15 VITALS — BP 122/72 | HR 87 | Temp 97.9°F | Resp 18 | Ht 65.0 in | Wt 148.1 lb

## 2023-03-15 DIAGNOSIS — Z23 Encounter for immunization: Secondary | ICD-10-CM | POA: Diagnosis not present

## 2023-03-15 DIAGNOSIS — C3412 Malignant neoplasm of upper lobe, left bronchus or lung: Secondary | ICD-10-CM

## 2023-03-15 MED ORDER — INFLUENZA VIRUS VACC SPLIT PF (FLUZONE) 0.5 ML IM SUSY
0.5000 mL | PREFILLED_SYRINGE | Freq: Once | INTRAMUSCULAR | Status: AC
  Administered 2023-03-15: 0.5 mL via INTRAMUSCULAR
  Filled 2023-03-15: qty 0.5

## 2023-03-15 NOTE — Progress Notes (Signed)
[facility] OFFICE PROGRESS NOTE   Diagnosis: Cancer, history of breast cancer, history of anal cancer  INTERVAL HISTORY:   Ms. Hornik returns as scheduled.  She reports improvement in dyspnea compared to when she was here in August.  Leg edema has improved.  No new complaint.  Objective:  Vital signs in last 24 hours:  Blood pressure 122/72, pulse 87, temperature 97.9 F (36.6 C), temperature source Oral, resp. rate 18, height 5\' 5"  (1.651 m), weight 148 lb 1.6 oz (67.2 kg), last menstrual period 04/07/2008, SpO2 93%.  Lymphatics: No cervical or supraclavicular nodes Resp: Decreased breath sounds at the right upper posterior chest, no respiratory distress Cardio: Regular rate and rhythm GI: No hepatosplenomegaly Vascular: Trace edema to left lower leg   Lab Results:  Lab Results  Component Value Date   WBC 5.8 08/08/2021   HGB 14.0 08/08/2021   HCT 41.9 08/08/2021   MCV 102.4 (H) 08/08/2021   PLT 224 08/08/2021   NEUTROABS 3.9 08/08/2021    CMP  Lab Results  Component Value Date   NA 136 08/08/2021   K 4.0 08/08/2021   CL 94 (L) 08/08/2021   CO2 31 08/08/2021   GLUCOSE 117 (H) 08/08/2021   BUN 16 08/08/2021   CREATININE 0.98 08/08/2021   CALCIUM 9.5 08/08/2021   PROT 7.8 08/08/2021   ALBUMIN 3.6 08/08/2021   AST 23 08/08/2021   ALT 12 08/08/2021   ALKPHOS 68 08/08/2021   BILITOT 0.5 08/08/2021   GFRNONAA >60 08/08/2021   GFRAA >60 01/29/2020    Lab Results  Component Value Date   CEA 5.5 (H) 08/02/2012    Lab Results  Component Value Date   INR 1.04 06/07/2018   LABPROT 13.5 06/07/2018    Imaging:  NM PET Image Restag (PS) Skull Base To Thigh  Result Date: 03/12/2023 CLINICAL DATA:  Subsequent treatment strategy for non-small cell lung cancer. EXAM: NUCLEAR MEDICINE PET SKULL BASE TO THIGH TECHNIQUE: 7.65 mCi F-18 FDG was injected intravenously. Full-ring PET imaging was performed from the skull base to thigh after the  radiotracer. CT data was obtained and used for attenuation correction and anatomic localization. Fasting blood glucose: 124 mg/dl COMPARISON:  CT chest 81/19/1478 and PET-CT from 03/02/2022 FINDINGS: Mediastinal blood pool activity: SUV max 2.55 Liver activity: SUV max NA NECK: No hypermetabolic lymph nodes in the neck. Incidental CT findings: None. CHEST: 1.2 cm right paratracheal lymph node has an SUV max of 3.74, image 65/. On previous PET-CT from 03/02/2022 this measured 1 cm with SUV max of 3.74. Subcarinal lymph node measures 1 cm with SUV max of 3.65, image 75/4. previously 1.2 cm with SUV max of 3.3. The treated lung tumor within the right upper lobe measures 3.0 x 2.1 cm with SUV max of 3.07, image 18/7. Previously 2.8 x 2.1 cm with SUV max 3.67. Subpleural masslike architectural distortion and fibrosis overlying the periphery of the right mid lung compatible with progressive changes of external beam radiation corresponding SUV max is equal to 4.32, image 32/7. This obscures the underlying treated lung nodule within the periphery of the right upper lobe. Post treatment change with masslike architectural distortion within the right middle and right lower lobe is again noted and measures 5.0 x 3.6 cm and has an SUV max of 3.41, image 39/7. On the previous exam this measured 4.3 by 3.6 cm with SUV max of 2.93. There are postsurgical changes within the left upper lobe. Several part solid nodules in the  left lung are again noted. -Index left upper lobe part solid nodule measures 1.7 cm with SUV max of 1.69, image 30/7. On the previous PET-CT this measured 1.1 cm with SUV max 1.67. -Sub-solid nodule within the perihilar aspect of the posterior left upper lobe measures 2.2 cm with SUV max of 1.5, image 31/7. Previously 1.8 cm with SUV max of 1.8. Non-solid nodule within the left lower lobe measures 1.5 cm with SUV max of 0.76, image 40/7. Unchanged compared with the previous exam. Non-solid nodule within the  posterior left lower lobe measures 1.1 cm without significant tracer uptake, image 48/7. Unchanged from previous exam. Incidental CT findings: None. ABDOMEN/PELVIS: No abnormal hypermetabolic activity within the liver, pancreas, adrenal glands, or spleen. No hypermetabolic lymph nodes in the abdomen or pelvis. Incidental CT findings: Aortic atherosclerotic calcifications. Nonobstructing stones noted within the inferior pole of left kidney. SKELETON: No focal hypermetabolic activity to suggest skeletal metastasis. Incidental CT findings: None. IMPRESSION: 1. Multifocal areas of masslike architectural distortion in surrounding fibrosis are identified within the right lung. There is mild tracer uptake associated with these areas which are favored to represent changes secondary to external beam radiation. No highly specific findings identified to suggest locally recurrent tracer avid tumor in the right lung. 2. No signs of tracer avid distant metastatic disease. 3. Stable appearance of prominent mediastinal lymph nodes with low level FDG uptake. 4. Multiple part solid nodules in the left lung are again noted and are stable in size and FDG avidity. Multifocal pulmonary adenocarcinoma remains a distinct concern. 5. Nonobstructing left renal stones. 6.  Aortic Atherosclerosis (ICD10-I70.0). Electronically Signed   By: Signa Kell M.D.   On: 03/12/2023 14:29    Medications: I have reviewed the patient's current medications.   Assessment/Plan: Anal cancer-left anal margin/anal canal mass, status post a biopsy on 08/09/2012 confirming invasive moderately differentiated squamous cell carcinoma,p16 positive.   -Staging PET scan 08/20/2012-hypermetabolic anal mass, no hypermetabolic metastases   -Initiation of concurrent radiation with cycle 1 of mitomycin C./5-fluorouracil 09/02/2012.   -She completed cycle 2 5-fluorouracil/mitomycin C. beginning 10/01/2012.   -She completed radiation 10/10/2012.   2. Right breast  cancer 2009, ER positive, PR positive, HER-2 positive. She is status post neoadjuvant AC x4 cycles followed by Taxol/Herceptin. She underwent a right lumpectomy and sentinel lymph node biopsy 09/22/2008 with no evidence of residual invasive carcinoma and 5 negative sentinel lymph nodes. She then completed right breast radiation. She began tamoxifen 12/15/2008 and completed adjuvant Herceptin 05/25/2009. The tamoxifen was discontinued and she was switched to Arimidex in July 2013.  Arimidex discontinued in mid July 2019. 3. History of enlargement of the right tonsil.   4. Nonspecific pulmonary nodules without hypermetabolic activity on the PET scan 08/20/2012. Chest CT 10/21/2012 with scattered pulmonary nodules including nodules that were not present in 2010. Annual surveillance was recommended. Follow-up chest CT 10/24/2013 with scattered groundglass densities again noted in both lungs mostly unchanged when compared to the previous study. A sub-solid right lower lobe nodule had increased central density. The size was unchanged.The nodule has been present since 2009. CT chest 06/13/2016-a sub-solid 9 mm right upper lobe nodule has enlarged compared to 2015, a right lower lobe irregular nodular density appears more well defined, new vague 5 mm groundglass left upper lobe nodule, left lower lobe 10 mm groundglass nodule is more apparent, posterior left upper lobe circumscribed solid nodule measures 11 x 10 mm and is new PET scan 06/22/2016-malignant range activity involving the 11 mm left  upper lobe nodule, no other hypermetabolic nodules, suspicious sub-solid right lower lobe nodule Wedge resection of a hypermetabolic left upper lobe nodule on 07/05/2016-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 No EGFR, ALK, BRAF, RET, ERBB2, or ROS1 alteration CT chest 11/14/2016-status post left  upper lobe wedge resection, stable right lung nodules Chest CT 05/02/2017-stable bilateral solid and groundglass nodules, stable mild mediastinal lymphadenopathy CT chest 12/13/2017- enlargement of right upper lobe nodule, other lung nodules and chest lymph node stable PET 01/09/2018- right upper lobe nodule and right lower lobe nodule have enlarged since 2018 and have low level metabolic activity increased size of an 8 mm right upper lobe nodule without increased metabolic activity, stable activity and a mildly enlarged lower paratracheal node, no evidence of metastatic disease in the abdomen or pelvis CT chest 04/22/2018- no new lung nodules, dominant right lung nodules slightly increased in size Bronchoscopy/EBUS 05/04/2019 with biopsy of level 7 node, biopsy of right lower lobe nodule, FNA of right lower lobe nodule-negative CT biopsy of right upper lobe nodule 06/07/2018- adenocarcinoma with lepidic and acinar patterns SBRT to the dominant right upper lobe nodule 07/23/2018-07/30/2018 CT 09/16/2018-slight increase in size of medial right upper lobe nodule-possibly secondary to interval radiation, other nodules are unchanged CT 12/25/2018- medial right upper lobe nodule has decreased in size slightly, multiple additional solid and groundglass nodules are stable CT 06/09/2019-enlarging right lower lobe nodule, enlarging subsolid nodule, new right upper lobe nodule SBRT to right lower lobe lung nodule, 3 fractions 07/08/2019-07/14/2019 CT 12/05/2019-dominant right lower lobe lesion stable to slightly decreased in size, progressive solid component associated with a dominant right upper lung nodule, mild progression of a left upper lobe nodule, new subsolid anterior medial left upper lobe nodule Cycle 1 Alimta/carboplatin/pembrolizumab 02/04/2020 Cycle 2 Alimta/carboplatin/pembrolizumab 02/23/2020 Cycle 3 Alimta/carboplatin/pembrolizumab 03/15/2020 Cycle 4 Alimta/carboplatin/pembrolizumab 04/05/2020 (Alimta and  carboplatin dose reduced secondary to anemia) CT chest 04/12/2020-decreased size of solid right lower lobe and right upper lobe nodules, groundglass nodules in the left lung unchanged Cycle 5 Alimta/pembrolizumab 05/03/2020 Cycle 6 Alimta/pembrolizumab 05/21/2020 Cycle 7 Alimta/pembrolizumab 06/14/2020 Cycle 8 Alimta/pembrolizumab 07/05/2020 CT chest 07/22/2020-stable nodules, no evidence of disease progression, unchanged multifocal groundglass nodules suspicious for multifocal adenocarcinoma. Cycle 9 Alimta/Pembrolizumab 07/26/2020 Cycle 10 Alimta/Pembrolizumab 08/16/2020 Cycle 11 Alimta/Pembrolizumab 09/06/2020 Cycle 12 Alimta/pembrolizumab 09/27/2020 Cycle 13 Alimta/pembrolizumab 10/18/2020 CT chest 11/12/2020-enlarging soft tissue density right upper lobe medially adjacent to the mediastinum measuring 2.7 x 2.1 cm, concerning for recurrent tumor.  Stable radiation changes involving the right upper lobe and right lower lobe.  Stable scattered small solid and subsolid pulmonary nodules bilaterally.  New right-sided pleural effusion.  Stable borderline enlarged mediastinal lymph nodes. Bronchoscopy 12/28/2020-right upper lobe and left lower lobe lesions biopsy-negative pathology PET 12/29/2020-low metabolic activity involving the right suprahilar consolidative process-favored benign, measured smaller, additional groundglass densities with very low-level metabolic activity, radiation change in the right lower lobe, resolution of right pleural effusion, no distant metastatic disease Cycle 14 Alimta/pembrolizumab 01/19/2021 Cycle 15 Alimta/pembrolizumab 02/07/2021 Cycle 16 of Alimta/pembrolizumab 02/28/2021 Cycle 17 Alimta/pembrolizumab 03/28/2021 CT chest 04/18/2021-increased areas of masslike density in the right upper lobe and right lower lobe with a new right lower lobe satellite nodule, other groundglass attenuation lesions are stable, slight decrease in size of chronic right pleural effusion CT head  05/06/2021-no evidence of metastatic disease CT chest 05/20/2018-right upper lobe and right lower lobe masses are unchanged, groundglass nodules in the left lung  are unchanged, no mediastinal adenopathy, resist measurements of target lesions given 05/30/2021 SWOG [id number]-sotorasib Sotorasib placed on hold 06/30/2021 CT chest 07/07/2021-no change in right suprahilar/paramediastinal density, stable right lower lobe nodularity with adjacent bandlike density, stable groundglass left lung nodules CT chest 10/27/2021-mild increase in density of the right infrahilar mass, no change in size, new 5 mm right upper lobe subpleural nodule, stable left lung nodules CT chest 02/17/2022-enlargement of small mediastinal nodes, stable posttreatment changes at the central right upper lobe, right lower lobe masslike density slightly larger, subpleural right upper lobe pulmonary nodule larger, slight enlargement of groundglass lesion in the left lower lobe PET 03/02/2022-hypermetabolic right upper lobe subpleural nodule, mild tracer activity in treated tumor in the right upper lobe and right lower lobe-posttreatment change?,  Mild increased activity associated with right paratracheal and subcarinal nodes, chronic loculated right pleural effusion with mild tracer uptake, subsolid left lung nodules with mild tracer uptake, no evidence of metastatic disease to the abdomen or pelvis SBRT to right subpleural mass 04/04/2022 - 04/11/2022, 54 Gray in 3 fractions CT chest 06/11/2022-stable right suprahilar and right infrahilar masses, stable small right pleural effusion, stable subsolid nodules in the left upper lobe and left lower lobe, right subpleural mass appears smaller with new inflammatory changes by my review CT chest 10/03/2022-stable right suprahilar infrahilar masslike densities and subpleural consolidation at the right upper lobe site of previous radiation, new small subsolid nodules in the left upper lobe felt to most likely be  inflammatory, stable borderline enlarged right paratracheal and mildly enlarged right hilar lymph nodes, stable trace right pleural effusion CT chest 01/01/2023-progression of subpleural consolidative opacity in the anterolateral right lung, stable right infrahilar and suprahilar masslike opacity, stable areas of groundglass opacity in the left lung, slight increase in mediastinal and right hilar lymph nodes, slight increase in chronic small right pleural effusion PET 03/12/2023-multifocal areas of masslike torsion in the right lung with mild tracer uptake favored to represent radiation change, no evidence of distant metastatic disease, stable appearance of prominent mediastinal nodes with low-level FDG uptake, multiple part solid left lung nodules stable to slightly larger 5.  Baker's cyst left leg, pain and swelling in the left calf, negative Doppler 01/24/2020 and 01/29/2020-evaluated by orthopedics and underwent aspiration of the cyst 01/29/2000, completed a course of Keflex 6.  Anemia secondary to chemotherapy, status post 2 units of packed red blood cells on 04/10/2020-improved 7.  Port-A-Cath placement 01/12/2021; Port-A-Cath removal 02/24/2021 due to infection, culture positive for MRSA, status post 2 courses of doxycycline, Bactrim DS started 03/28/2021, Bactrim DS resumed 04/26/2021 8.  Diarrhea beginning 06/21/2021-sotorasib discontinued 07/01/2021, resolved       Disposition: Ms Vivero appears stable.  He is in clinical remission from breast cancer and anal cancer.  She will continue yearly mammography.  I reviewed the restaging PET findings and images with her.  The pleural-parenchymal changes in the right lung appear to be related to radiation.  There is no clear evidence of progressive disease in the right lung or mediastinum.  No evidence of distant metastatic disease.  A few of the subsolid left lung nodules are slightly larger compared to the PET in 2023.  These likely represent indolent  adenocarcinomas.  She prefers observation as opposed to a biopsy or treatment.  She will return for an office visit and repeat chest CT in 6 months.  She will call in the interim for new symptoms.  She received an influenza vaccine today.  Thornton Papas, MD  03/15/2023  12:04 PM

## 2023-03-29 ENCOUNTER — Other Ambulatory Visit (HOSPITAL_BASED_OUTPATIENT_CLINIC_OR_DEPARTMENT_OTHER): Payer: Self-pay

## 2023-03-29 MED ORDER — COMIRNATY 30 MCG/0.3ML IM SUSY
PREFILLED_SYRINGE | INTRAMUSCULAR | 0 refills | Status: DC
  Filled 2023-03-29: qty 0.3, 1d supply, fill #0

## 2023-04-01 ENCOUNTER — Other Ambulatory Visit: Payer: Self-pay

## 2023-04-01 ENCOUNTER — Emergency Department (HOSPITAL_BASED_OUTPATIENT_CLINIC_OR_DEPARTMENT_OTHER): Payer: BC Managed Care – PPO

## 2023-04-01 ENCOUNTER — Inpatient Hospital Stay (HOSPITAL_BASED_OUTPATIENT_CLINIC_OR_DEPARTMENT_OTHER)
Admission: EM | Admit: 2023-04-01 | Discharge: 2023-04-07 | DRG: 286 | Disposition: A | Discharge status: Home / Self Care | Payer: BC Managed Care – PPO | Attending: Internal Medicine | Admitting: Internal Medicine

## 2023-04-01 ENCOUNTER — Encounter (HOSPITAL_BASED_OUTPATIENT_CLINIC_OR_DEPARTMENT_OTHER): Payer: Self-pay

## 2023-04-01 DIAGNOSIS — R0902 Hypoxemia: Principal | ICD-10-CM

## 2023-04-01 DIAGNOSIS — I25110 Atherosclerotic heart disease of native coronary artery with unstable angina pectoris: Secondary | ICD-10-CM | POA: Diagnosis present

## 2023-04-01 DIAGNOSIS — T451X5A Adverse effect of antineoplastic and immunosuppressive drugs, initial encounter: Secondary | ICD-10-CM | POA: Diagnosis present

## 2023-04-01 DIAGNOSIS — Y842 Radiological procedure and radiotherapy as the cause of abnormal reaction of the patient, or of later complication, without mention of misadventure at the time of the procedure: Secondary | ICD-10-CM | POA: Diagnosis present

## 2023-04-01 DIAGNOSIS — J439 Emphysema, unspecified: Secondary | ICD-10-CM | POA: Diagnosis not present

## 2023-04-01 DIAGNOSIS — J8410 Pulmonary fibrosis, unspecified: Secondary | ICD-10-CM | POA: Diagnosis not present

## 2023-04-01 DIAGNOSIS — Z79899 Other long term (current) drug therapy: Secondary | ICD-10-CM | POA: Diagnosis not present

## 2023-04-01 DIAGNOSIS — Z801 Family history of malignant neoplasm of trachea, bronchus and lung: Secondary | ICD-10-CM | POA: Diagnosis not present

## 2023-04-01 DIAGNOSIS — I428 Other cardiomyopathies: Secondary | ICD-10-CM | POA: Diagnosis present

## 2023-04-01 DIAGNOSIS — Z1152 Encounter for screening for COVID-19: Secondary | ICD-10-CM

## 2023-04-01 DIAGNOSIS — J440 Chronic obstructive pulmonary disease with acute lower respiratory infection: Secondary | ICD-10-CM | POA: Diagnosis present

## 2023-04-01 DIAGNOSIS — F419 Anxiety disorder, unspecified: Secondary | ICD-10-CM | POA: Diagnosis present

## 2023-04-01 DIAGNOSIS — J9601 Acute respiratory failure with hypoxia: Secondary | ICD-10-CM | POA: Diagnosis present

## 2023-04-01 DIAGNOSIS — Z87891 Personal history of nicotine dependence: Secondary | ICD-10-CM

## 2023-04-01 DIAGNOSIS — I272 Pulmonary hypertension, unspecified: Secondary | ICD-10-CM

## 2023-04-01 DIAGNOSIS — R7989 Other specified abnormal findings of blood chemistry: Secondary | ICD-10-CM | POA: Diagnosis present

## 2023-04-01 DIAGNOSIS — I2723 Pulmonary hypertension due to lung diseases and hypoxia: Secondary | ICD-10-CM | POA: Diagnosis present

## 2023-04-01 DIAGNOSIS — I5043 Acute on chronic combined systolic (congestive) and diastolic (congestive) heart failure: Secondary | ICD-10-CM | POA: Diagnosis not present

## 2023-04-01 DIAGNOSIS — C3412 Malignant neoplasm of upper lobe, left bronchus or lung: Secondary | ICD-10-CM | POA: Diagnosis not present

## 2023-04-01 DIAGNOSIS — Z85048 Personal history of other malignant neoplasm of rectum, rectosigmoid junction, and anus: Secondary | ICD-10-CM

## 2023-04-01 DIAGNOSIS — I2609 Other pulmonary embolism with acute cor pulmonale: Secondary | ICD-10-CM | POA: Diagnosis not present

## 2023-04-01 DIAGNOSIS — Z823 Family history of stroke: Secondary | ICD-10-CM

## 2023-04-01 DIAGNOSIS — I5082 Biventricular heart failure: Secondary | ICD-10-CM | POA: Diagnosis present

## 2023-04-01 DIAGNOSIS — D539 Nutritional anemia, unspecified: Secondary | ICD-10-CM | POA: Diagnosis present

## 2023-04-01 DIAGNOSIS — I50813 Acute on chronic right heart failure: Secondary | ICD-10-CM | POA: Diagnosis not present

## 2023-04-01 DIAGNOSIS — R0600 Dyspnea, unspecified: Secondary | ICD-10-CM | POA: Diagnosis not present

## 2023-04-01 DIAGNOSIS — D6481 Anemia due to antineoplastic chemotherapy: Secondary | ICD-10-CM | POA: Diagnosis present

## 2023-04-01 DIAGNOSIS — C210 Malignant neoplasm of anus, unspecified: Secondary | ICD-10-CM | POA: Diagnosis present

## 2023-04-01 DIAGNOSIS — C50911 Malignant neoplasm of unspecified site of right female breast: Secondary | ICD-10-CM | POA: Diagnosis present

## 2023-04-01 DIAGNOSIS — J90 Pleural effusion, not elsewhere classified: Secondary | ICD-10-CM | POA: Diagnosis not present

## 2023-04-01 DIAGNOSIS — J4489 Other specified chronic obstructive pulmonary disease: Secondary | ICD-10-CM | POA: Diagnosis present

## 2023-04-01 DIAGNOSIS — C50411 Malignant neoplasm of upper-outer quadrant of right female breast: Secondary | ICD-10-CM

## 2023-04-01 DIAGNOSIS — D630 Anemia in neoplastic disease: Secondary | ICD-10-CM | POA: Diagnosis present

## 2023-04-01 DIAGNOSIS — C3410 Malignant neoplasm of upper lobe, unspecified bronchus or lung: Secondary | ICD-10-CM | POA: Diagnosis not present

## 2023-04-01 DIAGNOSIS — Z923 Personal history of irradiation: Secondary | ICD-10-CM

## 2023-04-01 DIAGNOSIS — Z170 Estrogen receptor positive status [ER+]: Secondary | ICD-10-CM

## 2023-04-01 DIAGNOSIS — R079 Chest pain, unspecified: Secondary | ICD-10-CM | POA: Diagnosis not present

## 2023-04-01 DIAGNOSIS — I2729 Other secondary pulmonary hypertension: Secondary | ICD-10-CM | POA: Diagnosis not present

## 2023-04-01 DIAGNOSIS — M7122 Synovial cyst of popliteal space [Baker], left knee: Secondary | ICD-10-CM | POA: Diagnosis present

## 2023-04-01 DIAGNOSIS — R778 Other specified abnormalities of plasma proteins: Secondary | ICD-10-CM | POA: Diagnosis not present

## 2023-04-01 DIAGNOSIS — R0689 Other abnormalities of breathing: Secondary | ICD-10-CM | POA: Diagnosis present

## 2023-04-01 DIAGNOSIS — I2510 Atherosclerotic heart disease of native coronary artery without angina pectoris: Secondary | ICD-10-CM | POA: Diagnosis not present

## 2023-04-01 DIAGNOSIS — R0602 Shortness of breath: Secondary | ICD-10-CM | POA: Diagnosis not present

## 2023-04-01 DIAGNOSIS — Z853 Personal history of malignant neoplasm of breast: Secondary | ICD-10-CM | POA: Diagnosis not present

## 2023-04-01 DIAGNOSIS — E871 Hypo-osmolality and hyponatremia: Secondary | ICD-10-CM | POA: Diagnosis present

## 2023-04-01 DIAGNOSIS — R918 Other nonspecific abnormal finding of lung field: Secondary | ICD-10-CM | POA: Diagnosis not present

## 2023-04-01 LAB — COMPREHENSIVE METABOLIC PANEL
ALT: 29 U/L (ref 0–44)
AST: 29 U/L (ref 15–41)
Albumin: 4.1 g/dL (ref 3.5–5.0)
Alkaline Phosphatase: 65 U/L (ref 38–126)
Anion gap: 11 (ref 5–15)
BUN: 18 mg/dL (ref 6–20)
CO2: 30 mmol/L (ref 22–32)
Calcium: 9 mg/dL (ref 8.9–10.3)
Chloride: 92 mmol/L — ABNORMAL LOW (ref 98–111)
Creatinine, Ser: 0.71 mg/dL (ref 0.44–1.00)
GFR, Estimated: >60 mL/min (ref >60)
Glucose, Bld: 97 mg/dL (ref 70–99)
Potassium: 4.6 mmol/L (ref 3.5–5.1)
Sodium: 133 mmol/L — ABNORMAL LOW (ref 135–145)
Total Bilirubin: 0.8 mg/dL (ref <1.2)
Total Protein: 7.4 g/dL (ref 6.5–8.1)

## 2023-04-01 LAB — CBC WITH DIFFERENTIAL/PLATELET
Abs Immature Granulocytes: 0.03 10*3/uL (ref 0.00–0.07)
Basophils Absolute: 0 10*3/uL (ref 0.0–0.1)
Basophils Relative: 1 %
Eosinophils Absolute: 0.1 10*3/uL (ref 0.0–0.5)
Eosinophils Relative: 2 %
HCT: 49 % — ABNORMAL HIGH (ref 36.0–46.0)
Hemoglobin: 16.5 g/dL — ABNORMAL HIGH (ref 12.0–15.0)
Immature Granulocytes: 1 %
Lymphocytes Relative: 15 %
Lymphs Abs: 0.8 10*3/uL (ref 0.7–4.0)
MCH: 36.4 pg — ABNORMAL HIGH (ref 26.0–34.0)
MCHC: 33.7 g/dL (ref 30.0–36.0)
MCV: 108.2 fL — ABNORMAL HIGH (ref 80.0–100.0)
Monocytes Absolute: 0.8 10*3/uL (ref 0.1–1.0)
Monocytes Relative: 15 %
Neutro Abs: 3.3 10*3/uL (ref 1.7–7.7)
Neutrophils Relative %: 66 %
Platelets: 221 10*3/uL (ref 150–400)
RBC: 4.53 MIL/uL (ref 3.87–5.11)
RDW: 18.8 % — ABNORMAL HIGH (ref 11.5–15.5)
WBC: 5 10*3/uL (ref 4.0–10.5)
nRBC: 0.4 % — ABNORMAL HIGH (ref 0.0–0.2)

## 2023-04-01 LAB — BLOOD GAS, VENOUS
Acid-Base Excess: 9.2 mmol/L — ABNORMAL HIGH (ref 0.0–2.0)
Bicarbonate: 36.7 mmol/L — ABNORMAL HIGH (ref 20.0–28.0)
O2 Saturation: 83.6 %
Patient temperature: 37
pCO2, Ven: 62 mm[Hg] — ABNORMAL HIGH (ref 44–60)
pH, Ven: 7.38 (ref 7.25–7.43)
pO2, Ven: 55 mm[Hg] — ABNORMAL HIGH (ref 32–45)

## 2023-04-01 LAB — RESP PANEL BY RT-PCR (RSV, FLU A&B, COVID)  RVPGX2
Influenza A by PCR: NEGATIVE
Influenza B by PCR: NEGATIVE
Resp Syncytial Virus by PCR: NEGATIVE
SARS Coronavirus 2 by RT PCR: NEGATIVE

## 2023-04-01 LAB — LACTIC ACID, PLASMA
Lactic Acid, Venous: 0.6 mmol/L (ref 0.5–1.9)
Lactic Acid, Venous: 0.6 mmol/L (ref 0.5–1.9)

## 2023-04-01 LAB — TROPONIN I (HIGH SENSITIVITY)
Troponin I (High Sensitivity): 29 ng/L — ABNORMAL HIGH (ref <18)
Troponin I (High Sensitivity): 27 ng/L — ABNORMAL HIGH (ref <18)
Troponin I (High Sensitivity): 23 ng/L — ABNORMAL HIGH (ref <18)

## 2023-04-01 LAB — BRAIN NATRIURETIC PEPTIDE: B Natriuretic Peptide: 737.4 pg/mL — ABNORMAL HIGH (ref 0.0–100.0)

## 2023-04-01 MED ORDER — SODIUM CHLORIDE 0.9% FLUSH
3.0000 mL | Freq: Two times a day (BID) | INTRAVENOUS | Status: DC
  Administered 2023-04-01 – 2023-04-06 (×9): 3 mL via INTRAVENOUS

## 2023-04-01 MED ORDER — MOMETASONE FURO-FORMOTEROL FUM 100-5 MCG/ACT IN AERO
2.0000 | INHALATION_SPRAY | Freq: Two times a day (BID) | RESPIRATORY_TRACT | Status: DC
  Filled 2023-04-01: qty 8.8

## 2023-04-01 MED ORDER — IPRATROPIUM-ALBUTEROL 0.5-2.5 (3) MG/3ML IN SOLN: 3.0000 mL | Freq: Once | RESPIRATORY_TRACT | Status: AC

## 2023-04-01 MED ORDER — SODIUM CHLORIDE 0.9% FLUSH: 3.0000 mL | INTRAVENOUS | Status: DC | PRN

## 2023-04-01 MED ORDER — ONDANSETRON HCL 4 MG/2ML IJ SOLN: 4.0000 mg | Freq: Four times a day (QID) | INTRAMUSCULAR | Status: DC | PRN

## 2023-04-01 MED ORDER — IOHEXOL 350 MG/ML SOLN
75.0000 mL | Freq: Once | INTRAVENOUS | Status: AC | PRN
  Administered 2023-04-01: 75 mL via INTRAVENOUS

## 2023-04-01 MED ORDER — SODIUM CHLORIDE 0.9 % IV SOLN
500.0000 mg | Freq: Once | INTRAVENOUS | Status: AC
  Administered 2023-04-01: 500 mg via INTRAVENOUS
  Filled 2023-04-01: qty 5

## 2023-04-01 MED ORDER — IPRATROPIUM-ALBUTEROL 0.5-2.5 (3) MG/3ML IN SOLN
RESPIRATORY_TRACT | Status: AC
  Administered 2023-04-01: 3 mL via RESPIRATORY_TRACT
  Filled 2023-04-01: qty 3

## 2023-04-01 MED ORDER — ALBUTEROL SULFATE (2.5 MG/3ML) 0.083% IN NEBU: 2.5000 mg | INHALATION_SOLUTION | RESPIRATORY_TRACT | Status: DC | PRN

## 2023-04-01 MED ORDER — BUDESON-GLYCOPYRROL-FORMOTEROL 160-9-4.8 MCG/ACT IN AERO: 2.0000 | INHALATION_SPRAY | Freq: Two times a day (BID) | RESPIRATORY_TRACT | Status: DC

## 2023-04-01 MED ORDER — SODIUM CHLORIDE 0.9 % IV SOLN
1.0000 g | Freq: Once | INTRAVENOUS | Status: AC
  Administered 2023-04-01: 1 g via INTRAVENOUS
  Filled 2023-04-01: qty 10

## 2023-04-01 MED ORDER — ACETAMINOPHEN 325 MG PO TABS
650.0000 mg | ORAL_TABLET | Freq: Four times a day (QID) | ORAL | Status: DC | PRN
  Administered 2023-04-03: 650 mg via ORAL
  Filled 2023-04-01: qty 2

## 2023-04-01 MED ORDER — SODIUM CHLORIDE 0.9 % IV SOLN: 250.0000 mL | INTRAVENOUS | Status: AC | PRN

## 2023-04-01 MED ORDER — BUDESON-GLYCOPYRROL-FORMOTEROL 160-9-4.8 MCG/ACT IN AERO
2.0000 | INHALATION_SPRAY | Freq: Two times a day (BID) | RESPIRATORY_TRACT | Status: DC
  Administered 2023-04-02 – 2023-04-05 (×5): 2 via RESPIRATORY_TRACT

## 2023-04-01 MED ORDER — ENOXAPARIN SODIUM 40 MG/0.4ML IJ SOSY
40.0000 mg | PREFILLED_SYRINGE | INTRAMUSCULAR | Status: DC
  Administered 2023-04-01 – 2023-04-06 (×6): 40 mg via SUBCUTANEOUS
  Filled 2023-04-01 (×6): qty 0.4

## 2023-04-01 MED ORDER — UMECLIDINIUM BROMIDE 62.5 MCG/ACT IN AEPB
1.0000 | INHALATION_SPRAY | Freq: Every day | RESPIRATORY_TRACT | Status: DC
  Filled 2023-04-01: qty 7

## 2023-04-01 MED ORDER — HYDROCODONE-ACETAMINOPHEN 5-325 MG PO TABS: 1.0000 | ORAL_TABLET | ORAL | Status: DC | PRN

## 2023-04-01 MED ORDER — ACETAMINOPHEN 650 MG RE SUPP: 650.0000 mg | Freq: Four times a day (QID) | RECTAL | Status: DC | PRN

## 2023-04-01 MED ORDER — FUROSEMIDE 10 MG/ML IJ SOLN
20.0000 mg | Freq: Once | INTRAMUSCULAR | Status: AC
  Administered 2023-04-01: 20 mg via INTRAVENOUS
  Filled 2023-04-01: qty 2

## 2023-04-01 MED ORDER — ONDANSETRON HCL 4 MG PO TABS: 4.0000 mg | ORAL_TABLET | Freq: Four times a day (QID) | ORAL | Status: DC | PRN

## 2023-04-01 NOTE — ED Triage Notes (Signed)
She reports being short of breath x 2 days. She states her SPO2 at baseline is 85%. She is in no distress, but is short of breath. She tells me she is not on home O2; and that she recentl had a P.E.T. scan.

## 2023-04-01 NOTE — Assessment & Plan Note (Signed)
Will let oncology know but patient has been admitted she is being actively followed by Dr. Truett Perna it is possible that progression of her lung disease may be explaining her progressive hypoxia

## 2023-04-01 NOTE — ED Notes (Signed)
Called [facility] for transport, pt bed assignment is ready

## 2023-04-01 NOTE — ED Provider Notes (Signed)
[place] EMERGENCY DEPARTMENT AT [facility] Provider Note   CSN: [id number] Arrival date & time: 04/01/23  1118     History  Chief Complaint  Patient presents with   Shortness of Breath    Gina Cervantes is a 62 y.o. female.  HPI     62yo female with history of anal cancer 2014, breast cancer 2009, non-small cell lung cancer, lung nodules, pleural-parenchymal changes in the right lung thought to be related to radiation who presents to concern for shortness of breath.  Reports 2 days of shortness of breath and fatigue Did not notice significant cough until today after arrival to ED Has chronic left lower extremity swelling but does not think swelling is worse.  Reports swelling seems to wax and wane depending on how much using knee and notes hx of Baker's Cyst.   No fever Has felt some chills Low appetite No nausea, no vomiting, no chest pain Reports O2 lower at baseline, does not have oxygen at home.  Past Medical History:  Diagnosis Date   Anal cancer (HCC) 08/09/2012   Squamous cell   Anxiety    PANIC ATTACKS   Arthritis    Breast cancer (HCC) 2009   ER+PR+HER-2+   Ductal carcinoma (HCC) 02/2008   invasive    Heart palpitations    History of radiation therapy 09/02/12-10/10/12   anal 50.4Gy   History of shingles 10/2014   HPV (human papilloma virus) anogenital infection    vulvar/ freezing hx   Hx of radiation therapy 2020   Lung nodule    Malignant neoplasm of upper lobe of left lung (HCC) 06/2016   Personal history of chemotherapy    Personal history of radiation therapy 2010   Pneumonia    NO RECENT PROBLEMS   PONV (postoperative nausea and vomiting)    Hypotension   Radiation 10/21/08-12/08/08   Right breast 6240 cGy   Shingles 2017   Status post chemotherapy 02/2008   Taxol/Herceptin     Home Medications Prior to Admission medications   Medication Sig Start Date End Date Taking? Authorizing Provider  albuterol (VENTOLIN HFA) 108 (90  Base) MCG/ACT inhaler INHALE 2 PUFFS BY MOUTH EVERY 6 HOURS AS NEEDED FOR WHEEZING OR SHORTNESS OF BREATH Patient not taking: Reported on 03/15/2023 02/19/23   Ladene Artist, MD  Budeson-Glycopyrrol-Formoterol (BREZTRI AEROSPHERE) 160-9-4.8 MCG/ACT AERO Inhale 2 puffs into the lungs in the morning and at bedtime. 07/10/22   Audie Box L, DO  CALCIUM PO Take 1,200 mg by mouth daily. D3 Patient not taking: Reported on 01/04/2023    [provider]  COVID-19 mRNA vaccine, Pfizer, (COMIRNATY) syringe Inject into the muscle. 03/29/23   Judyann Munson, MD  loratadine (CLARITIN) 10 MG tablet Take 10 mg by mouth as needed for allergies. Patient not taking: Reported on 03/15/2023    [provider]  LORazepam (ATIVAN) 0.5 MG tablet Take 1 tablet (0.5 mg total) by mouth every 8 (eight) hours as needed for anxiety (or nausea). Patient not taking: Reported on 03/15/2023 10/05/22   Ladene Artist, MD      Allergies    No known allergies    Review of Systems   Review of Systems  Physical Exam Updated Vital Signs BP 98/62   Pulse 95   Temp 98.3 F (36.8 C) (Oral)   Resp (!) 21   LMP 04/07/2008   SpO2 92%  Physical Exam Vitals and nursing note reviewed.  Constitutional:      General:  She is not in acute distress.    Appearance: She is well-developed. She is not diaphoretic.  HENT:     Head: Normocephalic and atraumatic.  Eyes:     Conjunctiva/sclera: Conjunctivae normal.  Neck:     Vascular: JVD present.  Cardiovascular:     Rate and Rhythm: Normal rate and regular rhythm.     Heart sounds: Normal heart sounds. No murmur heard.    No friction rub. No gallop.  Pulmonary:     Effort: Pulmonary effort is normal. No respiratory distress.     Breath sounds: Rales present. No wheezing.  Abdominal:     General: There is no distension.     Palpations: Abdomen is soft.     Tenderness: There is no abdominal tenderness. There is no guarding.  Musculoskeletal:         General: No tenderness.     Cervical back: Normal range of motion.     Left lower leg: Edema (reports baseline) present.  Skin:    General: Skin is warm and dry.     Findings: No erythema or rash.  Neurological:     Mental Status: She is alert and oriented to person, place, and time.     ED Results / Procedures / Treatments   Labs (all labs ordered are listed, but only abnormal results are displayed) Labs Reviewed  CBC WITH DIFFERENTIAL/PLATELET - Abnormal; Notable for the following components:      Result Value   Hemoglobin 16.5 (*)    HCT 49.0 (*)    MCV 108.2 (*)    MCH 36.4 (*)    RDW 18.8 (*)    nRBC 0.4 (*)    All other components within normal limits  COMPREHENSIVE METABOLIC PANEL - Abnormal; Notable for the following components:   Sodium 133 (*)    Chloride 92 (*)    All other components within normal limits  BRAIN NATRIURETIC PEPTIDE - Abnormal; Notable for the following components:   B Natriuretic Peptide 737.4 (*)    All other components within normal limits  TROPONIN I (HIGH SENSITIVITY) - Abnormal; Notable for the following components:   Troponin I (High Sensitivity) 29 (*)    All other components within normal limits  TROPONIN I (HIGH SENSITIVITY) - Abnormal; Notable for the following components:   Troponin I (High Sensitivity) 27 (*)    All other components within normal limits  RESP PANEL BY RT-PCR (RSV, FLU A&B, COVID)  RVPGX2  CULTURE, BLOOD (ROUTINE X 2)  CULTURE, BLOOD (ROUTINE X 2)  LACTIC ACID, PLASMA  LACTIC ACID, PLASMA    EKG EKG Interpretation Date/Time:  Sunday April 01 2023 11:39:01 EST Ventricular Rate:  115 PR Interval:  140 QRS Duration:  90 QT Interval:  321 QTC Calculation: 444 R Axis:   130  Text Interpretation: Sinus tachycardia Ventricular premature complex Biatrial enlargement Left posterior fascicular block Since prior ECG, rate has increased Confirmed by Alvira Monday ([id number]) on 04/01/2023 1:10:56 PM  Radiology CT  Angio Chest PE W and/or Wo Contrast  Result Date: 04/01/2023 CLINICAL DATA:  Pulmonary embolism (PE) suspected, high prob shortness of breath. EXAM: CT ANGIOGRAPHY CHEST WITH CONTRAST TECHNIQUE: Multidetector CT imaging of the chest was performed using the standard protocol during bolus administration of intravenous contrast. Multiplanar CT image reconstructions and MIPs were obtained to evaluate the vascular anatomy. RADIATION DOSE REDUCTION: This exam was performed according to the departmental dose-optimization program which includes automated exposure control, adjustment of the mA and/or kV according  to patient size and/or use of iterative reconstruction technique. CONTRAST:  75mL OMNIPAQUE IOHEXOL 350 MG/ML SOLN COMPARISON:  PET-CT scan from 03/12/2023 and CT scan chest from 01/01/2023. FINDINGS: Cardiovascular: No evidence of embolism to the proximal subsegmental pulmonary artery level. Normal cardiac size. No pericardial effusion. No aortic aneurysm. There are coronary artery calcifications, in keeping with coronary artery disease. There are also mild peripheral atherosclerotic vascular calcifications of thoracic aorta and its major branches. There is dilation of the main pulmonary trunk measuring up to 3.7 cm, which is nonspecific but can be seen with pulmonary artery hypertension. Mediastinum/Nodes: Visualized thyroid gland appears grossly unremarkable. No solid / cystic mediastinal masses. The esophagus is nondistended precluding optimal assessment. There are few mildly prominent mediastinal and hilar lymph nodes, which appear grossly similar to the prior study. No axillary lymphadenopathy by size criteria. Lungs/Pleura: The central tracheo-bronchial tree is patent. Minimal biapical paraseptal emphysematous changes noted. Redemonstration of multiple masslike opacities in the right lung predominantly in the perihilar region. These opacities are essentially similar to the prior PET-CT scan. There is also  subpleural opacity along the anterior right upper lobe and middle lobe. There are also multiple part solid 1-2 cm sized nodules in the left lung, which are also similar to the prior study. There is trace right pleural effusion. No left pleural effusion. No new lung mass, consolidation or lung collapse. Upper Abdomen: Visualized upper abdominal viscera within normal limits. Musculoskeletal: The visualized soft tissues of the chest wall are grossly unremarkable. No suspicious osseous lesions. There are mild multilevel degenerative changes in the visualized spine. Review of the MIP images confirms the above findings. IMPRESSION: 1. No embolism to the proximal subsegmental pulmonary artery level. 2. No new lung mass, consolidation or pneumothorax. There is trace right pleural effusion. No left pleural effusion. 3. Redemonstration of multiple masslike opacities throughout bilateral lungs as well as multiple ground-glass opacities in the left lung, which are essentially similar to the prior PET-CT scan from 03/12/2023. Please refer to prior CT scan report for additional details. 4. Multiple other nonacute observations, as described above. Aortic Atherosclerosis (ICD10-I70.0) and Emphysema (ICD10-J43.9). Electronically Signed   By: Jules Schick M.D.   On: 04/01/2023 14:39   DG Chest Portable 1 View  Result Date: 04/01/2023 CLINICAL DATA:  Shortness of breath. EXAM: PORTABLE CHEST 1 VIEW COMPARISON:  PET-CT 03/12/2023.  Chest x-ray 08/08/2021 FINDINGS: Irregular opacities in the right upper and mid lung similar to abnormality seen on recent PET imaging. Tiny right pleural effusion. Postsurgical scarring noted left upper lobe. The cardio pericardial silhouette is enlarged. No acute bony abnormality. Telemetry leads overlie the chest. IMPRESSION: 1. Irregular opacities in the right upper and mid lung similar to abnormality seen on recent PET imaging. The opacity in the right mid lung is progressive since previous chest  x-ray. 2. Tiny right pleural effusion. Electronically Signed   By: Kennith Center M.D.   On: 04/01/2023 12:31    Procedures Procedures    Medications Ordered in ED Medications  ipratropium-albuterol (DUONEB) 0.5-2.5 (3) MG/3ML nebulizer solution 3 mL (3 mLs Nebulization Given 04/01/23 1141)  cefTRIAXone (ROCEPHIN) 1 g in sodium chloride 0.9 % 100 mL IVPB (0 g Intravenous Stopped 04/01/23 1431)  azithromycin (ZITHROMAX) 500 mg in sodium chloride 0.9 % 250 mL IVPB (0 mg Intravenous Stopped 04/01/23 1501)  iohexol (OMNIPAQUE) 350 MG/ML injection 75 mL (75 mLs Intravenous Contrast Given 04/01/23 1330)    ED Course/ Medical Decision Making/ A&P  62yo female with history of anal cancer 2014, breast cancer 2009, non-small cell lung cancer, lung nodules, pleural-parenchymal changes in the right lung thought to be related to radiation who presents to concern for shortness of breath and found to have hypoxia.  Per RT her saturation was 69% on arrival and placed on nasal cannula with improvement. Has note that saturations 87% and home O2 prescribed but reports she started breztri and did not need home O2 an does not have home oxygen at home.    Differential diagnosis for dyspnea includes ACS, PE, COPD exacerbation, CHF exacerbation, anemia, pneumonia, viral etiology such as COVID 19 infection, metabolic abnormality, worsening of chronic lung damage.    Chest x-ray was done which showed irregular opacities similar to recent PET but worse since prior XR>  Ordered blood cx, rocephin/azithromycin given concern on CXR, cough today and chills-however given not clear pneumonia, elevation troponin/BNP and cancer hx did order CTA PE study for further evaluation for PE.   EKG was evaluated by me which showed normal sinus rhythm without acute changes.  CT PE study with no PE, no new lung mass or consolidation. Does have pleural effusion, mass like opacities similar to prior.    Labs personally evaluated and interpreted by me show mildly elevated troponin, elevated BNP without previous for comparison.  No sign of PE as etiology of this. Troponin elevation pattern not consistent with ACS.  Suspect possible CHF.  Will admit for acute hypoxic respiratory failure which may be secondary to CHF and or progression of underlying lung disease/radiation injury.        Final Clinical Impression(s) / ED Diagnoses Final diagnoses:  Hypoxia  Elevated brain natriuretic peptide (BNP) level  Elevated troponin    Rx / DC Orders ED Discharge Orders     None         Alvira Monday, MD 04/01/23 1826

## 2023-04-01 NOTE — ED Notes (Signed)
Called x1 nurse unavailable for report at this time.

## 2023-04-01 NOTE — Assessment & Plan Note (Signed)
no chest pain or EKG changes suggestive of acute ischemia Suspect this is in the setting of hypoxia continue to monitor repeat echogram

## 2023-04-01 NOTE — Assessment & Plan Note (Signed)
APPEARS Compensated and chronic Likely in the setting of COPD

## 2023-04-01 NOTE — Subjective & Objective (Signed)
2 day hx of SOB, not on home o2 Reporots that at home her O2 sats in mid 80% Hx of non small cell lung CA Reports chills no fever She was sating  69 % on RA on arrival to ER  CXR showed persistent opacities, CTA neg to PE Pt was given a dose of  Rocephin and Azithro CT showing trace plural effusion   Accepted to [facility] for CHF vs CAP

## 2023-04-01 NOTE — Assessment & Plan Note (Addendum)
Cause unclear it could be in the setting of actually chronic respiratory failure in the setting of non-small cell lung cancer which has been progressive Also elevated BNP and trace edema CHF exacerbation on  differential Will order echogram in a.m. if significantly abnormal will need cardiology evaluation and follow-up Patient would benefit from set up of home oxygen

## 2023-04-01 NOTE — Assessment & Plan Note (Signed)
Chronic stable followed by oncology

## 2023-04-01 NOTE — ED Provider Notes (Addendum)
Patient has decided to leave AGAINST MEDICAL ADVICE.  She is here with pneumonia, reactive airway process and new hypoxia.  She has been admitted to the hospital but wants to drive there herself but do not recommend that given oxygen requirement.  She understands the risks and benefits.  Patient left AMA.  Hospitalist team made aware.   Patient has changed mind and now will accept admission.   Virgina Norfolk, DO 04/01/23 1611    Virgina Norfolk, DO 04/01/23 [phone]

## 2023-04-01 NOTE — Plan of Care (Signed)
Plan of Care Note for accepted transfer   Patient: Gina Cervantes MRN: [id number]   DOA: 04/01/2023  Facility requesting transfer: [facility] Requesting Provider: Dr. Dalene Seltzer Reason for transfer: dyspnea Facility course:  [name] is a 62 yo female with PMH breast cancer, anal cancer, radiation treatments, HFmrEF who presented to [facility] with worsening dyspnea.  She has also had complaints of this when recently seen by oncology on 03/15/2023. Recent PET scan performed 03/12/2023.  Changes felt to be related to previous radiation with no clear evidence of progressive disease in the right lung or mediastinum nor distant metastatic disease. She was found to be hypoxic on room air and placed on oxygen. CT angio chest was obtained which was negative for PE and showed trace right pleural effusion.  BNP elevated, 737. Troponin trend flat (29 >> 27).  Lactic negative, 0.6 COVID, flu, RSV negative Hgb 16.5. MCV 108.2  Treated with azithro/rocephin and duoneb x 1.   Transfer initiated for CHF workup and/or infectious workup.    Plan of care: The patient is accepted for admission to Telemetry unit, at [facility].  Author: Lewie Chamber, MD 04/01/2023  Check [email or web address] for on-call coverage.  Nursing staff, Please call [facility] Admits & Consults System-Wide number on Amion as soon as patient's arrival, so appropriate admitting provider can evaluate the pt.

## 2023-04-01 NOTE — Assessment & Plan Note (Addendum)
Patient not on diuretics was not aware of this diagnosis.  Will repeat echogram  - Pt diagnosed with CHF based on presence of the following:  rales on exam,  bilateral leg edema, DOE,   pleural effusion    admit on telemetry,  cycle cardiac enzymes, Troponin    obtain serial ECG  to evaluate for ischemia as a cause of heart failure  monitor daily weight:  Filed Weights   04/01/23 1855  Weight: 68.4 kg   Last BNP BNP (last 3 results) Recent Labs    04/01/23 1141  BNP 737.4*      diurese with IV lasix 20 mg x once  and monitor orthostatics and creatinine to avoid over diuresis may need more  tomorrow  Order echogram to evaluate EF and valves    She does report that one of her legs occasionally gets more swollen than the other but she has a history of Baker's cyst

## 2023-04-01 NOTE — ED Notes (Signed)
Called [facility] for transport, pt bed assignment ready

## 2023-04-01 NOTE — H&P (Addendum)
Gina Cervantes QIH:[id number] DOB: 1961-03-25 DOA: 04/01/2023     PCP: Ladene Artist, MD     Pulmonary    Dr. Tonia Brooms  Oncology  Dr. Truett Perna    Patient arrived to ER on 04/01/23 at 1118 Referred by Attending Lewie Chamber, MD   Patient coming from:    home Lives  With family     Chief Complaint:   Chief Complaint  Patient presents with   Shortness of Breath    HPI: Gina Cervantes is a 62 y.o. female with medical history significant of anal ca, breast ca, NSCLC sp radiation and Chemo, systolic/diastolic CHF  Presented with  SOB 2 day hx of SOB, not on home o2 Reporots that at home her O2 sats in mid 80% Hx of non small cell lung CA Reports chills no fever She was sating  69 % on RA on arrival to ER  CXR showed persistent opacities, CTA neg to PE Pt was given a dose of  Rocephin and Azithro CT showing trace plural effusion   Accepted to [facility] for CHF vs CAP    Had COVID vaccination 2 days ago and had been a bit under the weather since Patient states that she routinely mildly hypoxic in mid 80s but yet walks on frequent walks and does not usually feel anything abnormal Reprots has hx of left knee leg swelling that this chronic she have had 2 negative DVT studies per pt last being in September Associated with Baker's cyst   Drinks about 8 drinks a week  denies any DT Does not smoke   Lab Results  Component Value Date   SARSCOV2NAA NEGATIVE 04/01/2023   SARSCOV2NAA RESULT: NEGATIVE 12/24/2020       Regarding pertinent Chronic problems:     chronic CHF diastolic/systolic/ combined - last echo 2022 Recent Results (from the past 87564 hour(s))  ECHOCARDIOGRAM COMPLETE   Collection Time: 12/24/20 10:15 AM  Result Value   Area-P 1/2 4.57   S' Lateral 3.60   Narrative      ECHOCARDIOGRAM REPORT      IMPRESSIONS    1. LV apical false tendon (normal variant). Left ventricular ejection fraction, by estimation, is 45 to 50%. The left ventricle has mildly  decreased function. The left ventricle demonstrates global hypokinesis. Left ventricular diastolic parameters are  consistent with Grade I diastolic dysfunction (impaired relaxation).  2. Right ventricular systolic function is low normal. The right ventricular size is normal. There is normal pulmonary artery systolic pressure. The estimated right ventricular systolic pressure is 25.7 mmHg.  3. The pericardial effusion is circumferential. There is no evidence of cardiac tamponade.  4. The mitral valve is grossly normal. Trivial mitral valve regurgitation.  5. The aortic valve is tricuspid. Aortic valve regurgitation is not visualized.  6. The inferior vena cava is normal in size with greater than 50% respiratory variability, suggesting right atrial pressure of 3 mmHg.        COPD -  followed by pulmonology   not  on baseline oxygen    Cancer: Anal cancer 2014 sp radiation and chemo, Right breat ca 2009 ER positive, PR positive, HER-2 positive sp lumpectomy and chemo followed by rdaition NSCLC  2018 sp chemo and radiation    While in ER:   Was initially treated Rocephin azithromycin CT scan did not show any new infiltrates which showed trace pleural effusion      Lab Orders         Blood  culture (routine x 2)         Resp panel by RT-PCR (RSV, Flu A&B, Covid) Anterior Nasal Swab         Lactic acid, plasma         CBC with Differential         Comprehensive metabolic panel         Brain natriuretic peptide      CXR -  1. Irregular opacities in the right upper and mid lung similar to abnormality seen on recent PET imaging. The opacity in the right mid lung is progressive since previous chest x-ray. 2. Tiny right pleural effusion.    CTA chest -  no PE,  no evidence of infiltrate No new lung mass, consolidation   trace right pleural effusion.  multiple masslike opacities throughout bilateral lungs as well as multiple ground-glass opacities in the left lung, which are essentially  similar to the prior  Following Medications were ordered in ER: Medications  ipratropium-albuterol (DUONEB) 0.5-2.5 (3) MG/3ML nebulizer solution 3 mL (3 mLs Nebulization Given 04/01/23 1141)  cefTRIAXone (ROCEPHIN) 1 g in sodium chloride 0.9 % 100 mL IVPB (0 g Intravenous Stopped 04/01/23 1431)  azithromycin (ZITHROMAX) 500 mg in sodium chloride 0.9 % 250 mL IVPB (0 mg Intravenous Stopped 04/01/23 1501)  iohexol (OMNIPAQUE) 350 MG/ML injection 75 mL (75 mLs Intravenous Contrast Given 04/01/23 1330)       ED Triage Vitals  Encounter Vitals Group     BP 04/01/23 1125 (!) 151/78     Systolic BP Percentile --      Diastolic BP Percentile --      Pulse Rate 04/01/23 1125 (!) 116     Resp 04/01/23 1125 18     Temp 04/01/23 1125 98.3 F (36.8 C)     Temp Source 04/01/23 1512 Oral     SpO2 04/01/23 1125 (!) 88 %     Weight 04/01/23 1855 150 lb 12.7 oz (68.4 kg)     Height 04/01/23 1855 5\' 5"  (1.651 m)     Head Circumference --      Peak Flow --      Pain Score 04/01/23 1129 0     Pain Loc --      Pain Education --      Exclude from Growth Chart --   ONGE(95)@     _________________________________________ Significant initial  Findings: Abnormal Labs Reviewed  CBC WITH DIFFERENTIAL/PLATELET - Abnormal; Notable for the following components:      Result Value   Hemoglobin 16.5 (*)    HCT 49.0 (*)    MCV 108.2 (*)    MCH 36.4 (*)    RDW 18.8 (*)    nRBC 0.4 (*)    All other components within normal limits  COMPREHENSIVE METABOLIC PANEL - Abnormal; Notable for the following components:   Sodium 133 (*)    Chloride 92 (*)    All other components within normal limits  BRAIN NATRIURETIC PEPTIDE - Abnormal; Notable for the following components:   B Natriuretic Peptide 737.4 (*)    All other components within normal limits  TROPONIN I (HIGH SENSITIVITY) - Abnormal; Notable for the following components:   Troponin I (High Sensitivity) 29 (*)    All other components within normal  limits  TROPONIN I (HIGH SENSITIVITY) - Abnormal; Notable for the following components:   Troponin I (High Sensitivity) 27 (*)    All other components within normal limits      __________  Troponin  Cardiac Panel (last 3 results) Recent Labs    04/01/23 1141 04/01/23 1345  TROPONINIHS 29* 27*     ECG: Ordered Personally reviewed and interpreted by me showing: HR : 115 Rhythm: Sinus tachycardia Ventricular premature complex Biatrial enlargement Left posterior fascicular block Since prior ECG, rate has increased QTC 444  BNP (last 3 results) Recent Labs    04/01/23 1141  BNP 737.4*     COVID-19 Labs  No results for input(s): "DDIMER", "FERRITIN", "LDH", "CRP" in the last 72 hours.  Lab Results  Component Value Date   SARSCOV2NAA NEGATIVE 04/01/2023   SARSCOV2NAA RESULT: NEGATIVE 12/24/2020     The recent clinical data is shown below. Vitals:   04/01/23 1651 04/01/23 1800 04/01/23 1855 04/01/23 1943  BP: 98/62   135/76  Pulse: (!) 117 95  92  Resp: (!) 24 (!) 21  17  Temp:    99 F (37.2 C)  TempSrc:    Oral  SpO2: (!) 87% 92%  95%  Weight:   68.4 kg   Height:   5\' 5"  (1.651 m)     WBC     Component Value Date/Time   WBC 5.0 04/01/2023 1141   LYMPHSABS 0.8 04/01/2023 1141   LYMPHSABS 1.6 01/02/2013 1510   MONOABS 0.8 04/01/2023 1141   MONOABS 0.7 01/02/2013 1510   EOSABS 0.1 04/01/2023 1141   EOSABS 0.2 01/02/2013 1510   BASOSABS 0.0 04/01/2023 1141   BASOSABS 0.0 01/02/2013 1510     Lactic Acid, Venous    Component Value Date/Time   LATICACIDVEN 0.6 04/01/2023 1345     Procalcitonin   Ordered     Results for orders placed or performed during the hospital encounter of 04/01/23  Resp panel by RT-PCR (RSV, Flu A&B, Covid) Anterior Nasal Swab     Status: None   Collection Time: 04/01/23 12:22 PM   Specimen: Anterior Nasal Swab  Result Value Ref Range Status   SARS Coronavirus 2 by RT PCR NEGATIVE NEGATIVE Final         Influenza A by PCR  NEGATIVE NEGATIVE Final   Influenza B by PCR NEGATIVE NEGATIVE Final         Resp Syncytial Virus by PCR NEGATIVE NEGATIVE Final        Blood culture (routine x 2)     Status: None (Preliminary result)   Collection Time: 04/01/23 12:44 PM   Specimen: BLOOD LEFT FOREARM  Result Value Ref Range Status   Specimen Description   Final    BLOOD LEFT FOREARM Performed at [facility], [address], [place], [place] [address]    Special Requests   Final    BOTTLES DRAWN AEROBIC AND ANAEROBIC Blood Culture adequate volume Performed at [facility], [address] [place], [place] [address]    Culture PENDING  Incomplete   Report Status PENDING  Incomplete    ABX started Antibiotics Given (last 72 hours)     Date/Time Action Medication Dose Rate   04/01/23 1400 New Bag/Given   cefTRIAXone (ROCEPHIN) 1 g in sodium chloride 0.9 % 100 mL IVPB 1 g 200 mL/hr   04/01/23 1400 New Bag/Given   azithromycin (ZITHROMAX) 500 mg in sodium chloride 0.9 % 250 mL IVPB 500 mg 250 mL/hr       No results found for the last 90 days.     __________________________________________________________ Recent Labs  Lab 04/01/23 1141  NA 133*  K 4.6  CO2 30  GLUCOSE 97  BUN 18  CREATININE 0.71  CALCIUM 9.0    Cr   stable,   Lab Results  Component Value Date   CREATININE 0.71 04/01/2023   CREATININE 0.98 08/08/2021   CREATININE 0.84 07/19/2021    Recent Labs  Lab 04/01/23 1141  AST 29  ALT 29  ALKPHOS 65  BILITOT 0.8  PROT 7.4  ALBUMIN 4.1   Lab Results  Component Value Date   CALCIUM 9.0 04/01/2023    Plt: Lab Results  Component Value Date   PLT 221 04/01/2023    Recent Labs  Lab 04/01/23 1141  WBC 5.0  NEUTROABS 3.3  HGB 16.5*  HCT 49.0*  MCV 108.2*  PLT 221    HG/HCT Up from baseline see below    Component Value Date/Time   HGB 16.5 (H) 04/01/2023 1141   HGB 14.0 08/08/2021 1440   HGB 13.5 01/02/2013 1510   HCT 49.0 (H) 04/01/2023  1141   HCT 39.1 01/02/2013 1510   MCV 108.2 (H) 04/01/2023 1141   MCV 105.7 (H) 01/02/2013 1510    _______________________________________________ Hospitalist was called for admission for  acute respiratory failure with Hypoxia, Elevated troponin     The following Work up has been ordered so far:  Orders Placed This Encounter  Procedures   Blood culture (routine x 2)   Resp panel by RT-PCR (RSV, Flu A&B, Covid) Anterior Nasal Swab   DG Chest Portable 1 View   CT Angio Chest PE W and/or Wo Contrast   Lactic acid, plasma   CBC with Differential   Comprehensive metabolic panel   Brain natriuretic peptide   ED Cardiac monitoring   Utilize spacer/aerochamber with mdi inhaler for COVID-19 positive patients or PUI for COVID-19   If O2 Sat <94% administer O2 at 2 liters/minute via nasal cannula   Cardiac Monitoring - Continuous Indefinite   Consult to hospitalist   Oxygen therapy Mode or (Route): Nasal cannula; Liters Per Minute: 2   ED EKG   EKG 12-Lead   EKG   EKG   Admit to Inpatient (patient's expected length of stay will be greater than 2 midnights or inpatient only procedure)     OTHER Significant initial  Findings:  labs showing:     DM  labs:  HbA1C: No results for input(s): "HGBA1C" in the last 8760 hours.     CBG (last 3)  No results for input(s): "GLUCAP" in the last 72 hours.        Cultures:    Component Value Date/Time   SDES  04/01/2023 1244    BLOOD LEFT FOREARM Performed at [facility], [address] [place], [place] [address]    [facility]  04/01/2023 1244    BOTTLES DRAWN AEROBIC AND ANAEROBIC Blood Culture adequate volume Performed at [facility], [address], [place], [place] [address]    CULT PENDING 04/01/2023 1244   REPTSTATUS PENDING 04/01/2023 1244     Radiological Exams on Admission: CT Angio Chest PE W and/or Wo Contrast  Result Date: 04/01/2023 CLINICAL DATA:  Pulmonary embolism (PE) suspected,  high prob shortness of breath. EXAM: CT ANGIOGRAPHY CHEST WITH CONTRAST TECHNIQUE: Multidetector CT imaging of the chest was performed using the standard protocol during bolus administration of intravenous contrast. Multiplanar CT image reconstructions and MIPs were obtained to evaluate the vascular anatomy. RADIATION DOSE REDUCTION: This exam was performed according to the departmental dose-optimization program which includes automated exposure control, adjustment of the mA and/or kV  according to patient size and/or use of iterative reconstruction technique. CONTRAST:  75mL OMNIPAQUE IOHEXOL 350 MG/ML SOLN COMPARISON:  PET-CT scan from 03/12/2023 and CT scan chest from 01/01/2023. FINDINGS: Cardiovascular: No evidence of embolism to the proximal subsegmental pulmonary artery level. Normal cardiac size. No pericardial effusion. No aortic aneurysm. There are coronary artery calcifications, in keeping with coronary artery disease. There are also mild peripheral atherosclerotic vascular calcifications of thoracic aorta and its major branches. There is dilation of the main pulmonary trunk measuring up to 3.7 cm, which is nonspecific but can be seen with pulmonary artery hypertension. Mediastinum/Nodes: Visualized thyroid gland appears grossly unremarkable. No solid / cystic mediastinal masses. The esophagus is nondistended precluding optimal assessment. There are few mildly prominent mediastinal and hilar lymph nodes, which appear grossly similar to the prior study. No axillary lymphadenopathy by size criteria. Lungs/Pleura: The central tracheo-bronchial tree is patent. Minimal biapical paraseptal emphysematous changes noted. Redemonstration of multiple masslike opacities in the right lung predominantly in the perihilar region. These opacities are essentially similar to the prior PET-CT scan. There is also subpleural opacity along the anterior right upper lobe and middle lobe. There are also multiple part solid 1-2 cm  sized nodules in the left lung, which are also similar to the prior study. There is trace right pleural effusion. No left pleural effusion. No new lung mass, consolidation or lung collapse. Upper Abdomen: Visualized upper abdominal viscera within normal limits. Musculoskeletal: The visualized soft tissues of the chest wall are grossly unremarkable. No suspicious osseous lesions. There are mild multilevel degenerative changes in the visualized spine. Review of the MIP images confirms the above findings. IMPRESSION: 1. No embolism to the proximal subsegmental pulmonary artery level. 2. No new lung mass, consolidation or pneumothorax. There is trace right pleural effusion. No left pleural effusion. 3. Redemonstration of multiple masslike opacities throughout bilateral lungs as well as multiple ground-glass opacities in the left lung, which are essentially similar to the prior PET-CT scan from 03/12/2023. Please refer to prior CT scan report for additional details. 4. Multiple other nonacute observations, as described above. Aortic Atherosclerosis (ICD10-I70.0) and Emphysema (ICD10-J43.9). Electronically Signed   By: Jules Schick M.D.   On: 04/01/2023 14:39   DG Chest Portable 1 View  Result Date: 04/01/2023 CLINICAL DATA:  Shortness of breath. EXAM: PORTABLE CHEST 1 VIEW COMPARISON:  PET-CT 03/12/2023.  Chest x-ray 08/08/2021 FINDINGS: Irregular opacities in the right upper and mid lung similar to abnormality seen on recent PET imaging. Tiny right pleural effusion. Postsurgical scarring noted left upper lobe. The cardio pericardial silhouette is enlarged. No acute bony abnormality. Telemetry leads overlie the chest. IMPRESSION: 1. Irregular opacities in the right upper and mid lung similar to abnormality seen on recent PET imaging. The opacity in the right mid lung is progressive since previous chest x-ray. 2. Tiny right pleural effusion. Electronically Signed   By: Kennith Center M.D.   On: 04/01/2023 12:31    _______________________________________________________________________________________________________ Latest  Blood pressure 135/76, pulse 92, temperature 99 F (37.2 C), temperature source Oral, resp. rate 17, height 5\' 5"  (1.651 m), weight 68.4 kg, last menstrual period 04/07/2008, SpO2 95%.   Vitals  labs and radiology finding personally reviewed  Review of Systems:    Pertinent positives include:  fatigue,  shortness of breath at rest.  Constitutional:  No weight loss, night sweats, Fevers, chills, weight loss  HEENT:  No headaches, Difficulty swallowing,Tooth/dental problems,Sore throat,  No sneezing, itching, ear ache, nasal congestion, post nasal drip,  Cardio-vascular:  No chest pain, Orthopnea, PND, anasarca, dizziness, palpitations.no Bilateral lower extremity swelling  GI:  No heartburn, indigestion, abdominal pain, nausea, vomiting, diarrhea, change in bowel habits, loss of appetite, melena, blood in stool, hematemesis Resp:  no No dyspnea on exertion, No excess mucus, no productive cough, No non-productive cough, No coughing up of blood.No change in color of mucus.No wheezing. Skin:  no rash or lesions. No jaundice GU:  no dysuria, change in color of urine, no urgency or frequency. No straining to urinate.  No flank pain.  Musculoskeletal:  No joint pain or no joint swelling. No decreased range of motion. No back pain.  Psych:  No change in mood or affect. No depression or anxiety. No memory loss.  Neuro: no localizing neurological complaints, no tingling, no weakness, no double vision, no gait abnormality, no slurred speech, no confusion  All systems reviewed and apart from HOPI all are negative _______________________________________________________________________________________________ Past Medical History:   Past Medical History:  Diagnosis Date   Anal cancer (HCC) 08/09/2012   Squamous cell   Anxiety    PANIC ATTACKS   Arthritis    Breast cancer (HCC)  2009   ER+PR+HER-2+   Ductal carcinoma (HCC) 02/2008   invasive    Heart palpitations    History of radiation therapy 09/02/12-10/10/12   anal 50.4Gy   History of shingles 10/2014   HPV (human papilloma virus) anogenital infection    vulvar/ freezing hx   Hx of radiation therapy 2020   Lung nodule    Malignant neoplasm of upper lobe of left lung (HCC) 06/2016   Personal history of chemotherapy    Personal history of radiation therapy 2010   Pneumonia    NO RECENT PROBLEMS   PONV (postoperative nausea and vomiting)    Hypotension   Radiation 10/21/08-12/08/08   Right breast 6240 cGy   Shingles 2017   Status post chemotherapy 02/2008   Taxol/Herceptin      Past Surgical History:  Procedure Laterality Date   BREAST BIOPSY Right 02/04/2008   malignant   BREAST LUMPECTOMY Right 2010   malignant   BREAST LUMPECTOMY WITH AXILLARY LYMPH NODE BIOPSY Right 5/10   Dr. Carolynne Edouard   BRONCHIAL BIOPSY  12/28/2020   Procedure: BRONCHIAL BIOPSIES;  Surgeon: Josephine Igo, DO;  Location: [facility];  Service: Pulmonary;;   BRONCHIAL BRUSHINGS  12/28/2020   Procedure: BRONCHIAL BRUSHINGS;  Surgeon: Josephine Igo, DO;  Location: [facility];  Service: Pulmonary;;   BRONCHIAL NEEDLE ASPIRATION BIOPSY  12/28/2020   Procedure: BRONCHIAL NEEDLE ASPIRATION BIOPSIES;  Surgeon: Josephine Igo, DO;  Location: [facility];  Service: Pulmonary;;   BRONCHIAL WASHINGS  12/28/2020   Procedure: BRONCHIAL WASHINGS;  Surgeon: Josephine Igo, DO;  Location: [facility];  Service: Pulmonary;;   CESAREAN SECTION     COLONOSCOPY W/ POLYPECTOMY     ENDOBRONCHIAL ULTRASOUND  12/28/2020   Procedure: ENDOBRONCHIAL ULTRASOUND;  Surgeon: Josephine Igo, DO;  Location: [facility];  Service: Pulmonary;;   EVALUATION UNDER ANESTHESIA WITH ANAL FISTULECTOMY N/A 08/09/2012   Procedure: EXAM UNDER ANESTHESIA AND BIOPSY OF ANAL MASS;  Surgeon: Adolph Pollack, MD;  Location: [facility];  Service: General;  Laterality: N/A;    IR IMAGING GUIDED PORT INSERTION  01/12/2021   IR RADIOLOGIST EVAL & MGMT  03/11/2021   IR REMOVAL TUN ACCESS W/ PORT W/O FL MOD SED  02/24/2021   KNEE ARTHROSCOPY Left    left knee   LUNG LOBECTOMY Left  PORTACATH PLACEMENT  03/2008   dr. Carolynne Edouard    REMOVAL PORTACATH  2011   VIDEO ASSISTED THORACOSCOPY (VATS)/WEDGE RESECTION Left 07/05/2016   Procedure: VIDEO ASSISTED THORACOSCOPY (VATS)/WEDGE RESECTION left upper lobe,  lymph node dissection and placement of OnQ catheter;  Surgeon: Delight Ovens, MD;  Location: [facility] OR;  Service: Thoracic;  Laterality: Left;   VIDEO BRONCHOSCOPY N/A 07/05/2016   Procedure: VIDEO BRONCHOSCOPY;  Surgeon: Delight Ovens, MD;  Location: [facility] OR;  Service: Thoracic;  Laterality: N/A;   VIDEO BRONCHOSCOPY N/A 05/03/2018   Procedure: VIDEO BRONCHOSCOPY;  Surgeon: Delight Ovens, MD;  Location: [facility] OR;  Service: Thoracic;  Laterality: N/A;   VIDEO BRONCHOSCOPY WITH ENDOBRONCHIAL NAVIGATION N/A 05/03/2018   Procedure: VIDEO BRONCHOSCOPY WITH ENDOBRONCHIAL NAVIGATION WITH BIOPSY;  Surgeon: Delight Ovens, MD;  Location: [facility] OR;  Service: Thoracic;  Laterality: N/A;   VIDEO BRONCHOSCOPY WITH ENDOBRONCHIAL NAVIGATION Bilateral 12/28/2020   Procedure: VIDEO BRONCHOSCOPY WITH ENDOBRONCHIAL NAVIGATION;  Surgeon: Josephine Igo, DO;  Location: [facility];  Service: Pulmonary;  Laterality: Bilateral;  ION   VIDEO BRONCHOSCOPY WITH ENDOBRONCHIAL ULTRASOUND N/A 05/03/2018   Procedure: VIDEO BRONCHOSCOPY WITH ENDOBRONCHIAL ULTRASOUND;  Surgeon: Delight Ovens, MD;  Location: [facility] OR;  Service: Thoracic;  Laterality: N/A;   VIDEO BRONCHOSCOPY WITH RADIAL ENDOBRONCHIAL ULTRASOUND  12/28/2020   Procedure: RADIAL ENDOBRONCHIAL ULTRASOUND;  Surgeon: Josephine Igo, DO;  Location: [facility];  Service: Pulmonary;;    Social History:  Ambulatory   independently       reports that she quit smoking about 9 years ago. Her smoking use included cigarettes. She started  smoking about 17 years ago. She has never used smokeless tobacco. She reports that she does not currently use alcohol. She reports that she does not use drugs.     Family History:   Family History  Problem Relation Age of Onset   Lung cancer Father    Stroke Mother    Breast cancer Neg Hx    ______________________________________________________________________________________________ Allergies: Allergies  Allergen Reactions   No Known Allergies      Prior to Admission medications   Medication Sig Start Date End Date Taking? Authorizing Provider  albuterol (VENTOLIN HFA) 108 (90 Base) MCG/ACT inhaler INHALE 2 PUFFS BY MOUTH EVERY 6 HOURS AS NEEDED FOR WHEEZING OR SHORTNESS OF BREATH Patient not taking: Reported on 03/15/2023 02/19/23   Ladene Artist, MD  Budeson-Glycopyrrol-Formoterol (BREZTRI AEROSPHERE) 160-9-4.8 MCG/ACT AERO Inhale 2 puffs into the lungs in the morning and at bedtime. 07/10/22   Audie Box L, DO  CALCIUM PO Take 1,200 mg by mouth daily. D3 Patient not taking: Reported on 01/04/2023    [provider]  COVID-19 mRNA vaccine, Pfizer, (COMIRNATY) syringe Inject into the muscle. 03/29/23   Judyann Munson, MD  loratadine (CLARITIN) 10 MG tablet Take 10 mg by mouth as needed for allergies. Patient not taking: Reported on 03/15/2023    [provider]  LORazepam (ATIVAN) 0.5 MG tablet Take 1 tablet (0.5 mg total) by mouth every 8 (eight) hours as needed for anxiety (or nausea). Patient not taking: Reported on 03/15/2023 10/05/22   Ladene Artist, MD    ___________________________________________________________________________________________________ Physical Exam:    04/01/2023    7:43 PM 04/01/2023    6:55 PM 04/01/2023    6:00 PM  Vitals with BMI  Height  5\' 5"    Weight  150 lbs 13 oz   BMI  25.09   Systolic 135    Diastolic 76  Pulse 92  95     1. General:  in No  Acute distress   Chronically ill  -appearing 2.  Psychological: Alert and   Oriented 3. Head/ENT:   Moist   Dry Mucous Membranes                          Head Non traumatic, neck supple                           Poor Dentition 4. SKIN: normal   Skin turgor,  Skin clean Dry and intact no rash    5. Heart: Regular rate and rhythm no  Murmur, no Rub or gallop 6. Lungs  no wheezes occasional  crackles   7. Abdomen: Soft,  non-tender, Non distended   bowel sounds present 8. Lower extremities: no clubbing, cyanosis,  edema left >Right chronic 9. Neurologically Grossly intact, moving all 4 extremities equally  10. MSK: Normal range of motion    Chart has been reviewed  ______________________________________________________________________________________________  Assessment/Plan 62 y.o. female with medical history significant of anal ca, breast ca, NSCLC sp radiation and Chemo, systolic/diastolic CHF  Admitted for   Hypoxia  Elevated troponin    Present on Admission:  Acute hypoxemic respiratory failure (HCC)  Anal cancer (HCC)  Breast cancer, right (HCC)  COPD with asthma (HCC)  Lung cancer, left  upper lobe (HCC)  Elevated troponin  Acute on chronic combined systolic and diastolic CHF (congestive heart failure) (HCC)  Hypercarbia    Acute hypoxemic respiratory failure (HCC) Cause unclear it could be in the setting of actually chronic respiratory failure in the setting of non-small cell lung cancer which has been progressive Also elevated BNP and trace edema CHF exacerbation on  differential Will order echogram in a.m. if significantly abnormal will need cardiology evaluation and follow-up Patient would benefit from set up of home oxygen  Anal cancer (HCC) Chronic stable followed by oncology  Breast cancer, right (HCC) Chronic stable followed by oncology  COPD with asthma (HCC) Chronic continue home medications  Lung cancer, left  upper lobe (HCC) Will let oncology know but patient has been admitted she is being actively  followed by Dr. Truett Perna it is possible that progression of her lung disease may be explaining her progressive hypoxia  Elevated troponin no chest pain or EKG changes suggestive of acute ischemia Suspect this is in the setting of hypoxia continue to monitor repeat echogram  Acute on chronic combined systolic and diastolic CHF (congestive heart failure) (HCC) Patient not on diuretics was not aware of this diagnosis.  Will repeat echogram  - Pt diagnosed with CHF based on presence of the following:  rales on exam,  bilateral leg edema, DOE,   pleural effusion    admit on telemetry,  cycle cardiac enzymes, Troponin    obtain serial ECG  to evaluate for ischemia as a cause of heart failure  monitor daily weight:  Filed Weights   04/01/23 1855  Weight: 68.4 kg   Last BNP BNP (last 3 results) Recent Labs    04/01/23 1141  BNP 737.4*      diurese with IV lasix 20 mg x once  and monitor orthostatics and creatinine to avoid over diuresis may need more  tomorrow  Order echogram to evaluate EF and valves    She does report that one of her legs occasionally gets more swollen than the other but she  has a history of Baker's cyst  Hypercarbia APPEARS Compensated and chronic Likely in the setting of COPD   Other plan as per orders.  DVT prophylaxis:  Lovenox      Code Status:    Code Status: Prior FULL CODE as per patient   I had personally discussed CODE STATUS with patient  ACP   none    Family Communication:   Family not at  Bedside    Diet heart healthy   Disposition Plan:        To home once workup is complete and patient is stable   Following barriers for discharge:                                                   Will likely need home health, home O2, set up                      Consults called: none     Admission status:  ED Disposition     ED Disposition  No Disposition Selected   Condition  --   Comment  Hospital Area: [facility]  [[id number]] Level of Care: Telemetry [5] Admit to tele based on following criteria: Acute CHF May admit patient to [facility] or [facility] if equivalent level of care is available:: Yes Interfacility t ransfer: Yes Covid Evaluation: Asymptomatic - no recent exposure (last 10 days) testing not required Diagnosis: Acute hypoxemic respiratory failure [facility]) [[id number]] Admitting Physician: Lewie Chamber [phone] Attending Physician: Lewie Chamber Daffy.Glade [id number]] Certification:: I certify this patient will need inpatient services for at least 2 midnights Expected Medical Readiness: 04/04/2023            inpatient     I Expect 2 midnight stay secondary to severity of patient's current illness need for inpatient interventions justified by the following:  hemodynamic instability despite optimal treatment ( hypoxia,  )   and extensive comorbidities including:  CHF     malignancy,   That are currently affecting medical management.   I expect  patient to be hospitalized for 2 midnights requiring inpatient medical care.  Patient is at high risk for adverse outcome (such as loss of life or disability) if not treated.  Indication for inpatient stay as follows:   New or worsening hypoxia   Need for  IV diuretics    Level of care     tele  For  24H    Lab Results  Component Value Date   SARSCOV2NAA NEGATIVE 04/01/2023     Precautions: admitted as   Covid Negative    Jayveon Convey 04/01/2023, 10:00 PM    [facility]     after 2 AM please page floor coverage PA If 7AM-7PM, please contact the day team taking care of the patient using [email or web address]

## 2023-04-01 NOTE — Assessment & Plan Note (Signed)
Chronic continue home medications ?

## 2023-04-02 ENCOUNTER — Inpatient Hospital Stay (HOSPITAL_COMMUNITY): Payer: BC Managed Care – PPO

## 2023-04-02 DIAGNOSIS — R079 Chest pain, unspecified: Secondary | ICD-10-CM | POA: Diagnosis not present

## 2023-04-02 DIAGNOSIS — J9601 Acute respiratory failure with hypoxia: Secondary | ICD-10-CM | POA: Diagnosis not present

## 2023-04-02 LAB — CBC
HCT: 44.2 % (ref 36.0–46.0)
Hemoglobin: 15 g/dL (ref 12.0–15.0)
MCH: 37.3 pg — ABNORMAL HIGH (ref 26.0–34.0)
MCHC: 33.9 g/dL (ref 30.0–36.0)
MCV: 110 fL — ABNORMAL HIGH (ref 80.0–100.0)
Platelets: 210 10*3/uL (ref 150–400)
RBC: 4.02 MIL/uL (ref 3.87–5.11)
RDW: 18.4 % — ABNORMAL HIGH (ref 11.5–15.5)
WBC: 4.2 10*3/uL (ref 4.0–10.5)
nRBC: 0 % (ref 0.0–0.2)

## 2023-04-02 LAB — COMPREHENSIVE METABOLIC PANEL
ALT: 26 U/L (ref 0–44)
AST: 25 U/L (ref 15–41)
Albumin: 3.3 g/dL — ABNORMAL LOW (ref 3.5–5.0)
Alkaline Phosphatase: 57 U/L (ref 38–126)
Anion gap: 10 (ref 5–15)
BUN: 18 mg/dL (ref 6–20)
CO2: 32 mmol/L (ref 22–32)
Calcium: 8.5 mg/dL — ABNORMAL LOW (ref 8.9–10.3)
Chloride: 95 mmol/L — ABNORMAL LOW (ref 98–111)
Creatinine, Ser: 0.75 mg/dL (ref 0.44–1.00)
GFR, Estimated: >60 mL/min (ref >60)
Glucose, Bld: 130 mg/dL — ABNORMAL HIGH (ref 70–99)
Potassium: 4.1 mmol/L (ref 3.5–5.1)
Sodium: 137 mmol/L (ref 135–145)
Total Bilirubin: 0.8 mg/dL (ref <1.2)
Total Protein: 6.7 g/dL (ref 6.5–8.1)

## 2023-04-02 LAB — ECHOCARDIOGRAM COMPLETE
Area-P 1/2: 4.63 cm2
Calc EF: 44 %
Est EF: 45
Height: 65 in
S' Lateral: 3.6 cm
Single Plane A2C EF: 46.9 %
Single Plane A4C EF: 50.1 %
Weight: 2412.71 [oz_av]

## 2023-04-02 LAB — MAGNESIUM
Magnesium: 1.6 mg/dL — ABNORMAL LOW (ref 1.7–2.4)
Magnesium: 1.7 mg/dL (ref 1.7–2.4)

## 2023-04-02 LAB — TROPONIN I (HIGH SENSITIVITY): Troponin I (High Sensitivity): 22 ng/L — ABNORMAL HIGH (ref <18)

## 2023-04-02 LAB — PHOSPHORUS: Phosphorus: 4.5 mg/dL (ref 2.5–4.6)

## 2023-04-02 LAB — HIV ANTIBODY (ROUTINE TESTING W REFLEX): HIV Screen 4th Generation wRfx: NONREACTIVE

## 2023-04-02 LAB — PROCALCITONIN: Procalcitonin: <0.1 ng/mL

## 2023-04-02 MED ORDER — ORAL CARE MOUTH RINSE: 15.0000 mL | OROMUCOSAL | Status: DC | PRN

## 2023-04-02 MED ORDER — FUROSEMIDE 10 MG/ML IJ SOLN
40.0000 mg | Freq: Once | INTRAMUSCULAR | Status: AC
  Administered 2023-04-02: 40 mg via INTRAVENOUS
  Filled 2023-04-02: qty 4

## 2023-04-02 NOTE — Plan of Care (Signed)
Problem: Education: Goal: Knowledge of General Education information will improve Description: Including pain rating scale, medication(s)/side effects and non-pharmacologic comfort measures Outcome: Progressing   Problem: Nutrition: Goal: Adequate nutrition will be maintained Outcome: Progressing   Problem: Clinical Measurements: Goal: Respiratory complications will improve Outcome: Progressing

## 2023-04-02 NOTE — Progress Notes (Signed)
HOSPITALIST ROUNDING NOTE Gina Cervantes ZOX:[id number]  DOB: [birth year]  DOA: 04/01/2023  PCP: Ladene Artist, MD  04/02/2023,8:18 AM   LOS: 1 day      Code Status: Full  From: Home  current Dispo: Likely home     62 year old white female With moderately differentiated squamous cell anal cancer status post radiation mitomycin and 5-fluorouracil completing chemo 10/01/2012 and radiation 10/10/2012 Right breast cancer with neoadjuvant Taxol Herceptin with-finished tamoxifen now on Arimidex Anemia secondary to chemotherapy Known underlying right lung radiation changes last seen in office by Dr. Truett Perna 03/15/2023-03/12/2023 PET scan showed fibrosis and lung stable mediastinal nodes etc. Last echocardiogram 12/2020 EF 45-50% global hypokinesis grade 1 DD circumferential pericardial effusion  Presented to emergency room 11/20 for 2 days SOB cough chronic lower extremity edema does not use home oxygen She contemplated leaving AGAINST MEDICAL ADVICE but t and then decided to stay Workup revealed sodium 133 chloride 92 BUN/creatinine 18/0.7 BNP 737 troponin trend flat 20 range WBC 5 hemoglobin 16 platelet 221 procalcitonin less than 0.1 CXR irregular a posterior right upper and middle lobe opacity progressive CT chest no pulmonary embolism no new lung mass trace right pleural effusion masslike opacities in bilateral lungs groundglass opacities left lung similar to PET scan  Admitted for shortness of breath-given Lasix IV 20 x 1 Echocardiogram pending   Plan  HFpEF versus underlying fibrosis from lung XRT/lung masses--does have pleural effusions on CT scan Given Lasix on admission 20 IV---give lasix 40 daily today and re-check in am At baseline very active and non-O2 dependant  Anal cancer, right breast cancer Defer to Dr. Chase Picket him to the treatment team but no urgent oncological needs  Macrocytic anemia with component of anemia of malignancy Hemoglobin is quite stable does not need  workup  Underlying COPD? Contionue breztri/Albuterol   DVT prophylaxis: loveneox  Status is: Inpatient Remains inpatient appropriate because: requires further work-up      Subjective: Awake coherent in nad no focal deficit Looks well Mor eSOB than her usual--started about 1 week prior No cp,n,v, cough  Objective + exam Vitals:   04/01/23 1855 04/01/23 1943 04/02/23 0037 04/02/23 0317  BP:  135/76 110/72 97/67  Pulse:  92 (!) 107 94  Resp:  17    Temp:  99 F (37.2 C) 99.4 F (37.4 C) 99 F (37.2 C)  TempSrc:  Oral Oral Oral  SpO2:  95% 93% 95%  Weight: 68.4 kg     Height: 5\' 5"  (1.651 m)      Filed Weights   04/01/23 1855  Weight: 68.4 kg    Examination:  Eoi ncat no focal deficit no icterus no pallor Decreased air entry posterolaterally abdomen soft--- genitourinary exam deferred Lower extremity edema present ROM is intact   Data Reviewed: reviewed   CBC    Component Value Date/Time   WBC 4.2 04/02/2023 0057   RBC 4.02 04/02/2023 0057   HGB 15.0 04/02/2023 0057   HGB 14.0 08/08/2021 1440   HGB 13.5 01/02/2013 1510   HCT 44.2 04/02/2023 0057   HCT 39.1 01/02/2013 1510   PLT 210 04/02/2023 0057   PLT 224 08/08/2021 1440   PLT 240 01/02/2013 1510   MCV 110.0 (H) 04/02/2023 0057   MCV 105.7 (H) 01/02/2013 1510   MCH 37.3 (H) 04/02/2023 0057   MCHC 33.9 04/02/2023 0057   RDW 18.4 (H) 04/02/2023 0057   RDW 13.6 01/02/2013 1510   LYMPHSABS 0.8 04/01/2023 1141   LYMPHSABS 1.6 01/02/2013 1510  MONOABS 0.8 04/01/2023 1141   MONOABS 0.7 01/02/2013 1510   EOSABS 0.1 04/01/2023 1141   EOSABS 0.2 01/02/2013 1510   BASOSABS 0.0 04/01/2023 1141   BASOSABS 0.0 01/02/2013 1510      Latest Ref Rng & Units 04/02/2023   12:57 AM 04/01/2023   11:41 AM 08/08/2021    2:40 PM  CMP  Glucose 70 - 99 mg/dL 782  97  956   BUN 6 - 20 mg/dL 18  18  16    Creatinine 0.44 - 1.00 mg/dL 2.13  0.86  5.78   Sodium 135 - 145 mmol/L 137  133  136   Potassium 3.5 - 5.1  mmol/L 4.1  4.6  4.0   Chloride 98 - 111 mmol/L 95  92  94   CO2 22 - 32 mmol/L 32  30  31   Calcium 8.9 - 10.3 mg/dL 8.5  9.0  9.5   Total Protein 6.5 - 8.1 g/dL 6.7  7.4  7.8   Total Bilirubin <1.2 mg/dL 0.8  0.8  0.5   Alkaline Phos 38 - 126 U/L 57  65  68   AST 15 - 41 U/L 25  29  23    ALT 0 - 44 U/L 26  29  12       Scheduled Meds:  Budeson-Glycopyrrol-Formoterol  2 puff Inhalation BID   enoxaparin (LOVENOX) injection  40 mg Subcutaneous Q24H   sodium chloride flush  3 mL Intravenous Q12H   Continuous Infusions:  sodium chloride      Time  45  Rhetta Mura, MD  [facility]

## 2023-04-02 NOTE — Progress Notes (Signed)
PT states she has already taken Marshall from home before RT visit.

## 2023-04-02 NOTE — Progress Notes (Signed)
IP PROGRESS NOTE  Subjective:   Ms. Rak is well-known to me with a history of non-small cell lung cancer.  She has a remote history of breast cancer and anal cancer.  She reports the acute onset of dyspnea yesterday.  She presented to the emergency room.  A CT chest revealed no pulmonary embolism or pneumonia.  Multiple lung masses are unchanged compared to the PET earlier this month.  She reports persistent leg swelling. She received a COVID-19 vaccine 03/29/2023 Objective: Vital signs in last 24 hours: Blood pressure 111/74, pulse 92, temperature 97.8 F (36.6 C), temperature source Oral, resp. rate 20, height 5\' 5"  (1.651 m), weight 150 lb 12.7 oz (68.4 kg), last menstrual period 04/07/2008, SpO2 97%.  Intake/Output from previous day: 11/24 0701 - 11/25 0700 In: 347.4 [IV Piggyback:347.4] Out: -   Physical Exam:  Lungs: Bilateral inspiratory/expiratory rhonchi and wheezes Cardiac: Regular rate and rhythm Abdomen: No hepatosplenomegaly Extremities: Trace edema to left lower leg   Portacath/PICC-without erythema  Lab Results: Recent Labs    04/01/23 1141 04/02/23 0057  WBC 5.0 4.2  HGB 16.5* 15.0  HCT 49.0* 44.2  PLT 221 210    BMET Recent Labs    04/01/23 1141 04/02/23 0057  NA 133* 137  K 4.6 4.1  CL 92* 95*  CO2 30 32  GLUCOSE 97 130*  BUN 18 18  CREATININE 0.71 0.75  CALCIUM 9.0 8.5*    Lab Results  Component Value Date   CEA 5.5 (H) 08/02/2012    Studies/Results: ECHOCARDIOGRAM COMPLETE  Result Date: 04/02/2023    ECHOCARDIOGRAM REPORT   Patient Name:   NICOLAS EISENHAUER Lave Date of Exam: 04/02/2023 Medical Rec #:  [id number]         Height:       65.0 in Accession #:    [id number]        Weight:       150.8 lb Date of Birth:  1960/08/17          BSA:          1.754 m Patient Age:    62 years          BP:           111/74 mmHg Patient Gender: F                 HR:           96 bpm. Exam Location:  Inpatient Procedure: 2D Echo, 3D Echo, Cardiac  Doppler and Color Doppler Indications:     R07.9* Chest pain, unspecified.  History:         Patient has prior history of Echocardiogram examinations, most                  recent 12/24/2020. CHF, COPD; Signs/Symptoms:Dyspnea and                  Shortness of Breath. Breast cancer. Lung cancer.  Sonographer:     Sheralyn Boatman RDCS Referring Phys:  Kenn File DOUTOVA Diagnosing Phys: Freida Busman McleanMD IMPRESSIONS  1. Left ventricular ejection fraction, by estimation, is 45%. The left ventricle has mildly decreased function. The left ventricle demonstrates global hypokinesis. Left ventricular diastolic parameters are consistent with Grade I diastolic dysfunction (impaired relaxation).  2. D-shaped septum suggestive of RV pressure/volume overload. Right ventricular systolic function is moderately reduced. The right ventricular size is moderately enlarged. There is mildly elevated pulmonary artery systolic pressure. The estimated right ventricular systolic pressure  is 40.9 mmHg.  3. Right atrial size was mildly dilated.  4. The mitral valve is normal in structure. No evidence of mitral valve regurgitation. No evidence of mitral stenosis.  5. The aortic valve is tricuspid. There is mild calcification of the aortic valve. Aortic valve regurgitation is not visualized. No aortic stenosis is present.  6. The inferior vena cava is normal in size with <50% respiratory variability, suggesting right atrial pressure of 8 mmHg. FINDINGS  Left Ventricle: Left ventricular ejection fraction, by estimation, is 45%. The left ventricle has mildly decreased function. The left ventricle demonstrates global hypokinesis. The left ventricular internal cavity size was normal in size. There is no left ventricular hypertrophy. Left ventricular diastolic parameters are consistent with Grade I diastolic dysfunction (impaired relaxation). Right Ventricle: D-shaped septum suggestive of RV pressure/volume overload. The right ventricular size is  moderately enlarged. No increase in right ventricular wall thickness. Right ventricular systolic function is moderately reduced. There is mildly elevated pulmonary artery systolic pressure. The tricuspid regurgitant velocity is 2.87 m/s, and with an assumed right atrial pressure of 8 mmHg, the estimated right ventricular systolic pressure is 40.9 mmHg. Left Atrium: Left atrial size was normal in size. Right Atrium: Right atrial size was mildly dilated. Pericardium: There is no evidence of pericardial effusion. Mitral Valve: The mitral valve is normal in structure. There is mild calcification of the mitral valve leaflet(s). Mild mitral annular calcification. No evidence of mitral valve regurgitation. No evidence of mitral valve stenosis. Tricuspid Valve: The tricuspid valve is normal in structure. Tricuspid valve regurgitation is trivial. Aortic Valve: The aortic valve is tricuspid. There is mild calcification of the aortic valve. Aortic valve regurgitation is not visualized. No aortic stenosis is present. Pulmonic Valve: The pulmonic valve was normal in structure. Pulmonic valve regurgitation is not visualized. Aorta: The aortic root is normal in size and structure. Venous: The inferior vena cava is normal in size with less than 50% respiratory variability, suggesting right atrial pressure of 8 mmHg. IAS/Shunts: No atrial level shunt detected by color flow Doppler.  LEFT VENTRICLE PLAX 2D LVIDd:         4.60 cm     Diastology LVIDs:         3.60 cm     LV e' medial:    7.51 cm/s LV PW:         0.90 cm     LV E/e' medial:  12.7 LV IVS:        0.70 cm     LV e' lateral:   8.81 cm/s LVOT diam:     1.90 cm     LV E/e' lateral: 10.9 LV SV:         51 LV SV Index:   29 LVOT Area:     2.84 cm                             3D Volume EF: LV Volumes (MOD)           3D EF:        45 % LV vol d, MOD A2C: 74.6 ml LV EDV:       170 ml LV vol d, MOD A4C: 72.1 ml LV ESV:       93 ml LV vol s, MOD A2C: 39.6 ml LV SV:        77 ml LV  vol s, MOD A4C: 36.0 ml LV SV MOD A2C:  35.0 ml LV SV MOD A4C:     72.1 ml LV SV MOD BP:      33.4 ml RIGHT VENTRICLE             IVC RV S prime:     10.00 cm/s  IVC diam: 2.00 cm TAPSE (M-mode): 1.4 cm LEFT ATRIUM             Index        RIGHT ATRIUM           Index LA diam:        3.10 cm 1.77 cm/m   RA Area:     18.70 cm LA Vol (A2C):   26.9 ml 15.33 ml/m  RA Volume:   52.80 ml  30.10 ml/m LA Vol (A4C):   26.5 ml 15.10 ml/m LA Biplane Vol: 27.6 ml 15.73 ml/m  AORTIC VALVE LVOT Vmax:   119.00 cm/s LVOT Vmean:  73.800 cm/s LVOT VTI:    0.181 m  AORTA Ao Root diam: 3.40 cm Ao Asc diam:  3.10 cm MITRAL VALVE               TRICUSPID VALVE MV Area (PHT): 4.63 cm    TR Peak grad:   32.9 mmHg MV Decel Time: 164 msec    TR Vmax:        287.00 cm/s MV E velocity: 95.70 cm/s MV A velocity: 94.73 cm/s  SHUNTS MV E/A ratio:  1.01        Systemic VTI:  0.18 m                            Systemic Diam: 1.90 cm Dalton McleanMD Electronically signed by Wilfred Lacy Signature Date/Time: 04/02/2023/2:24:40 PM    Final (Updated)    CT Angio Chest PE W and/or Wo Contrast  Result Date: 04/01/2023 CLINICAL DATA:  Pulmonary embolism (PE) suspected, high prob shortness of breath. EXAM: CT ANGIOGRAPHY CHEST WITH CONTRAST TECHNIQUE: Multidetector CT imaging of the chest was performed using the standard protocol during bolus administration of intravenous contrast. Multiplanar CT image reconstructions and MIPs were obtained to evaluate the vascular anatomy. RADIATION DOSE REDUCTION: This exam was performed according to the departmental dose-optimization program which includes automated exposure control, adjustment of the mA and/or kV according to patient size and/or use of iterative reconstruction technique. CONTRAST:  75mL OMNIPAQUE IOHEXOL 350 MG/ML SOLN COMPARISON:  PET-CT scan from 03/12/2023 and CT scan chest from 01/01/2023. FINDINGS: Cardiovascular: No evidence of embolism to the proximal subsegmental pulmonary  artery level. Normal cardiac size. No pericardial effusion. No aortic aneurysm. There are coronary artery calcifications, in keeping with coronary artery disease. There are also mild peripheral atherosclerotic vascular calcifications of thoracic aorta and its major branches. There is dilation of the main pulmonary trunk measuring up to 3.7 cm, which is nonspecific but can be seen with pulmonary artery hypertension. Mediastinum/Nodes: Visualized thyroid gland appears grossly unremarkable. No solid / cystic mediastinal masses. The esophagus is nondistended precluding optimal assessment. There are few mildly prominent mediastinal and hilar lymph nodes, which appear grossly similar to the prior study. No axillary lymphadenopathy by size criteria. Lungs/Pleura: The central tracheo-bronchial tree is patent. Minimal biapical paraseptal emphysematous changes noted. Redemonstration of multiple masslike opacities in the right lung predominantly in the perihilar region. These opacities are essentially similar to the prior PET-CT scan. There is also subpleural opacity along the anterior right upper lobe and middle lobe. There are  also multiple part solid 1-2 cm sized nodules in the left lung, which are also similar to the prior study. There is trace right pleural effusion. No left pleural effusion. No new lung mass, consolidation or lung collapse. Upper Abdomen: Visualized upper abdominal viscera within normal limits. Musculoskeletal: The visualized soft tissues of the chest wall are grossly unremarkable. No suspicious osseous lesions. There are mild multilevel degenerative changes in the visualized spine. Review of the MIP images confirms the above findings. IMPRESSION: 1. No embolism to the proximal subsegmental pulmonary artery level. 2. No new lung mass, consolidation or pneumothorax. There is trace right pleural effusion. No left pleural effusion. 3. Redemonstration of multiple masslike opacities throughout bilateral lungs  as well as multiple ground-glass opacities in the left lung, which are essentially similar to the prior PET-CT scan from 03/12/2023. Please refer to prior CT scan report for additional details. 4. Multiple other nonacute observations, as described above. Aortic Atherosclerosis (ICD10-I70.0) and Emphysema (ICD10-J43.9). Electronically Signed   By: Jules Schick M.D.   On: 04/01/2023 14:39   DG Chest Portable 1 View  Result Date: 04/01/2023 CLINICAL DATA:  Shortness of breath. EXAM: PORTABLE CHEST 1 VIEW COMPARISON:  PET-CT 03/12/2023.  Chest x-ray 08/08/2021 FINDINGS: Irregular opacities in the right upper and mid lung similar to abnormality seen on recent PET imaging. Tiny right pleural effusion. Postsurgical scarring noted left upper lobe. The cardio pericardial silhouette is enlarged. No acute bony abnormality. Telemetry leads overlie the chest. IMPRESSION: 1. Irregular opacities in the right upper and mid lung similar to abnormality seen on recent PET imaging. The opacity in the right mid lung is progressive since previous chest x-ray. 2. Tiny right pleural effusion. Electronically Signed   By: Kennith Center M.D.   On: 04/01/2023 12:31    Medications: I have reviewed the patient's current medications.  Assessment/Plan: Anal cancer-left anal margin/anal canal mass, status post a biopsy on 08/09/2012 confirming invasive moderately differentiated squamous cell carcinoma,p16 positive.   -Staging PET scan 08/20/2012-hypermetabolic anal mass, no hypermetabolic metastases   -Initiation of concurrent radiation with cycle 1 of mitomycin C./5-fluorouracil 09/02/2012.   -She completed cycle 2 5-fluorouracil/mitomycin C. beginning 10/01/2012.   -She completed radiation 10/10/2012.   2. Right breast cancer 2009, ER positive, PR positive, HER-2 positive. She is status post neoadjuvant AC x4 cycles followed by Taxol/Herceptin. She underwent a right lumpectomy and sentinel lymph node biopsy 09/22/2008 with no  evidence of residual invasive carcinoma and 5 negative sentinel lymph nodes. She then completed right breast radiation. She began tamoxifen 12/15/2008 and completed adjuvant Herceptin 05/25/2009. The tamoxifen was discontinued and she was switched to Arimidex in July 2013.  Arimidex discontinued in mid July 2019. 3. History of enlargement of the right tonsil.   4. Nonspecific pulmonary nodules without hypermetabolic activity on the PET scan 08/20/2012. Chest CT 10/21/2012 with scattered pulmonary nodules including nodules that were not present in 2010. Annual surveillance was recommended. Follow-up chest CT 10/24/2013 with scattered groundglass densities again noted in both lungs mostly unchanged when compared to the previous study. A sub-solid right lower lobe nodule had increased central density. The size was unchanged.The nodule has been present since 2009. CT chest 06/13/2016-a sub-solid 9 mm right upper lobe nodule has enlarged compared to 2015, a right lower lobe irregular nodular density appears more well defined, new vague 5 mm groundglass left upper lobe nodule, left lower lobe 10 mm groundglass nodule is more apparent, posterior left upper lobe circumscribed solid nodule measures 11 x 10 mm  and is new PET scan 06/22/2016-malignant range activity involving the 11 mm left upper lobe nodule, no other hypermetabolic nodules, suspicious sub-solid right lower lobe nodule Wedge resection of a hypermetabolic left upper lobe nodule on 07/05/2016-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 No EGFR, ALK, BRAF, RET, ERBB2, or ROS1 alteration CT chest 11/14/2016-status post left upper lobe wedge resection, stable right lung nodules Chest CT 05/02/2017-stable bilateral solid and groundglass nodules, stable mild mediastinal lymphadenopathy CT chest 12/13/2017- enlargement of right upper lobe  nodule, other lung nodules and chest lymph node stable PET 01/09/2018- right upper lobe nodule and right lower lobe nodule have enlarged since 2018 and have low level metabolic activity increased size of an 8 mm right upper lobe nodule without increased metabolic activity, stable activity and a mildly enlarged lower paratracheal node, no evidence of metastatic disease in the abdomen or pelvis CT chest 04/22/2018- no new lung nodules, dominant right lung nodules slightly increased in size Bronchoscopy/EBUS 05/04/2019 with biopsy of level 7 node, biopsy of right lower lobe nodule, FNA of right lower lobe nodule-negative CT biopsy of right upper lobe nodule 06/07/2018- adenocarcinoma with lepidic and acinar patterns SBRT to the dominant right upper lobe nodule 07/23/2018-07/30/2018 CT 09/16/2018-slight increase in size of medial right upper lobe nodule-possibly secondary to interval radiation, other nodules are unchanged CT 12/25/2018- medial right upper lobe nodule has decreased in size slightly, multiple additional solid and groundglass nodules are stable CT 06/09/2019-enlarging right lower lobe nodule, enlarging subsolid nodule, new right upper lobe nodule SBRT to right lower lobe lung nodule, 3 fractions 07/08/2019-07/14/2019 CT 12/05/2019-dominant right lower lobe lesion stable to slightly decreased in size, progressive solid component associated with a dominant right upper lung nodule, mild progression of a left upper lobe nodule, new subsolid anterior medial left upper lobe nodule Cycle 1 Alimta/carboplatin/pembrolizumab 02/04/2020 Cycle 2 Alimta/carboplatin/pembrolizumab 02/23/2020 Cycle 3 Alimta/carboplatin/pembrolizumab 03/15/2020 Cycle 4 Alimta/carboplatin/pembrolizumab 04/05/2020 (Alimta and carboplatin dose reduced secondary to anemia) CT chest 04/12/2020-decreased size of solid right lower lobe and right upper lobe nodules, groundglass nodules in the left lung unchanged Cycle 5 Alimta/pembrolizumab  05/03/2020 Cycle 6 Alimta/pembrolizumab 05/21/2020 Cycle 7 Alimta/pembrolizumab 06/14/2020 Cycle 8 Alimta/pembrolizumab 07/05/2020 CT chest 07/22/2020-stable nodules, no evidence of disease progression, unchanged multifocal groundglass nodules suspicious for multifocal adenocarcinoma. Cycle 9 Alimta/Pembrolizumab 07/26/2020 Cycle 10 Alimta/Pembrolizumab 08/16/2020 Cycle 11 Alimta/Pembrolizumab 09/06/2020 Cycle 12 Alimta/pembrolizumab 09/27/2020 Cycle 13 Alimta/pembrolizumab 10/18/2020 CT chest 11/12/2020-enlarging soft tissue density right upper lobe medially adjacent to the mediastinum measuring 2.7 x 2.1 cm, concerning for recurrent tumor.  Stable radiation changes involving the right upper lobe and right lower lobe.  Stable scattered small solid and subsolid pulmonary nodules bilaterally.  New right-sided pleural effusion.  Stable borderline enlarged mediastinal lymph nodes. Bronchoscopy 12/28/2020-right upper lobe and left lower lobe lesions biopsy-negative pathology PET 12/29/2020-low metabolic activity involving the right suprahilar consolidative process-favored benign, measured smaller, additional groundglass densities with very low-level metabolic activity, radiation change in the right lower lobe, resolution of right pleural effusion, no distant metastatic disease Cycle 14 Alimta/pembrolizumab 01/19/2021 Cycle 15 Alimta/pembrolizumab 02/07/2021 Cycle 16 of Alimta/pembrolizumab 02/28/2021 Cycle 17 Alimta/pembrolizumab 03/28/2021 CT chest 04/18/2021-increased areas of masslike density in the right upper lobe and right lower lobe with a new right lower lobe satellite nodule, other groundglass attenuation lesions are stable, slight decrease in size of chronic right pleural effusion CT head 05/06/2021-no evidence of metastatic disease CT chest 05/20/2018-right upper lobe and  right lower lobe masses are unchanged, groundglass nodules in the left lung are unchanged, no mediastinal adenopathy, resist measurements  of target lesions given 05/30/2021 SWOG [id number]-sotorasib Sotorasib placed on hold 06/30/2021 CT chest 07/07/2021-no change in right suprahilar/paramediastinal density, stable right lower lobe nodularity with adjacent bandlike density, stable groundglass left lung nodules CT chest 10/27/2021-mild increase in density of the right infrahilar mass, no change in size, new 5 mm right upper lobe subpleural nodule, stable left lung nodules CT chest 02/17/2022-enlargement of small mediastinal nodes, stable posttreatment changes at the central right upper lobe, right lower lobe masslike density slightly larger, subpleural right upper lobe pulmonary nodule larger, slight enlargement of groundglass lesion in the left lower lobe PET 03/02/2022-hypermetabolic right upper lobe subpleural nodule, mild tracer activity in treated tumor in the right upper lobe and right lower lobe-posttreatment change?,  Mild increased activity associated with right paratracheal and subcarinal nodes, chronic loculated right pleural effusion with mild tracer uptake, subsolid left lung nodules with mild tracer uptake, no evidence of metastatic disease to the abdomen or pelvis SBRT to right subpleural mass 04/04/2022 - 04/11/2022, 54 Gray in 3 fractions CT chest 06/11/2022-stable right suprahilar and right infrahilar masses, stable small right pleural effusion, stable subsolid nodules in the left upper lobe and left lower lobe, right subpleural mass appears smaller with new inflammatory changes by my review CT chest 10/03/2022-stable right suprahilar infrahilar masslike densities and subpleural consolidation at the right upper lobe site of previous radiation, new small subsolid nodules in the left upper lobe felt to most likely be inflammatory, stable borderline enlarged right paratracheal and mildly enlarged right hilar lymph nodes, stable trace right pleural effusion CT chest 01/01/2023-progression of subpleural consolidative opacity in the  anterolateral right lung, stable right infrahilar and suprahilar masslike opacity, stable areas of groundglass opacity in the left lung, slight increase in mediastinal and right hilar lymph nodes, slight increase in chronic small right pleural effusion PET 03/12/2023-multifocal areas of masslike torsion in the right lung with mild tracer uptake favored to represent radiation change, no evidence of distant metastatic disease, stable appearance of prominent mediastinal nodes with low-level FDG uptake, multiple part solid left lung nodules stable to slightly larger 5.  Baker's cyst left leg, pain and swelling in the left calf, negative Doppler 01/24/2020 and 01/29/2020-evaluated by orthopedics and underwent aspiration of the cyst 01/29/2000, completed a course of Keflex 6.  Anemia secondary to chemotherapy, status post 2 units of packed red blood cells on 04/10/2020-improved 7.  Port-A-Cath placement 01/12/2021; Port-A-Cath removal 02/24/2021 due to infection, culture positive for MRSA, status post 2 courses of doxycycline, Bactrim DS started 03/28/2021, Bactrim DS resumed 04/26/2021 8.  Diarrhea beginning 06/21/2021-sotorasib discontinued 07/01/2021, resolved 9.  Admission 04/01/2023 with dyspnea/hypoxia     Ms. Caserta has a history of non-small cell lung cancer.  A recent PET revealed no evidence of disease progression aside from slight enlargement of subsolid lesions in the left lung.  The changes at the peripheral right lung and pleura are felt to be secondary to radiation.  She has chronic dyspnea/hypoxia secondary to COPD and lung volume loss from surgery and radiation.  Reviewed the admission CT findings and images.  She is now admitted with dyspnea/hypoxia.  I suspect the current presentation is related to a COPD flare in conjunction with underlying lung volume loss.  I have a low clinical suspicion for lymphangitic tumor spread in the lungs.  Recommendations: Oxygen, pulmonary toilet, consider adding  steroids Consider consult with Dr. Tonia Brooms if her  clinical status does not improve over the next few days Call oncology as needed, outpatient follow-up is scheduled at the Cancer center      LOS: 1 day   Thornton Papas, MD   04/02/2023, 2:39 PM

## 2023-04-02 NOTE — Progress Notes (Signed)
   04/02/23 1945  Assess: MEWS Score  Temp 98.5 F (36.9 C)  BP 96/72  MAP (mmHg) 79  Pulse Rate (!) 110  Resp 17  SpO2 94 %  O2 Device Nasal Cannula  Assess: MEWS Score  MEWS Temp 0  MEWS Systolic 1  MEWS Pulse 1  MEWS RR 0  MEWS LOC 0  MEWS Score 2  MEWS Score Color Yellow  Assess: if the MEWS score is Yellow or Red  Were vital signs accurate and taken at a resting state? Yes  Does the patient meet 2 or more of the SIRS criteria? No  MEWS guidelines implemented  Yes, yellow  Treat  MEWS Interventions Considered administering scheduled or prn medications/treatments as ordered  Take Vital Signs  Increase Vital Sign Frequency  Yellow: Q2hr x1, continue Q4hrs until patient remains green for 12hrs  Escalate  MEWS: Escalate Yellow: Discuss with charge nurse and consider notifying provider and/or RRT  Notify: Charge Nurse/RN  Name of Charge Nurse/RN Notified lauren Hopkins, RN  Provider Notification  Provider Name/Title Chinita Greenland, NP  Date Provider Notified 04/02/23  Time Provider Notified 2014  Method of Notification  (secure chat)  Notification Reason Other (Comment) (yellow MEWS)  Assess: SIRS CRITERIA  SIRS Temperature  0  SIRS Pulse 1  SIRS Respirations  0  SIRS WBC 0  SIRS Score Sum  1   Yellow MEWS d/t elevated pulse. Notify CN and hospitalist and will continue to monitor.

## 2023-04-02 NOTE — Progress Notes (Signed)
  Echocardiogram 2D Echocardiogram has been performed.  Gina Cervantes 04/02/2023, 2:21 PM

## 2023-04-02 NOTE — Progress Notes (Signed)
Mobility Specialist - Progress Note  (3L [place]) Pre-mobility: 99 bpm HR, 93% SpO2 During mobility: 110 bpm HR, 83% SpO2 (4L [place]) Post-mobility: 107 bpm HR, BP, 89% SPO2    04/02/23 0907  Mobility  Activity Ambulated independently in hallway  Level of Assistance Independent  Assistive Device None  Distance Ambulated (ft) 60 ft  Range of Motion/Exercises Active  Activity Response Tolerated fair  Mobility Referral Yes  $Mobility charge 1 Mobility  Mobility Specialist Start Time (ACUTE ONLY) [id number]  Mobility Specialist Stop Time (ACUTE ONLY) 0907  Mobility Specialist Time Calculation (min) (ACUTE ONLY) 9 min   Pt was found in bed and agreeable to ambulate. Pt dropped to 83% after ambulating 30 ft. Stated having no symptoms but did state feeling her heart racing. Had x1 standing rest break for about 1 min and able to increase up to 85% SPO2. RN increase O2 to 4L [place]. Able to increase to 88-89% SPO2 and returned back to room. Pt was left in bed with RN in room.  Billey Chang Mobility Specialist

## 2023-04-02 NOTE — Progress Notes (Signed)
At rest on room air the patients oxygen saturation 88%. Oxygen 3 lt [place] applied, saturation increased 92% at rest. The patient ambulated in the hallways approximately 25 feet when the oxygen level decreased 84% on 3 liters [place]. Oxygen increased 5 liter [place] O2 sat 88 %. She reports feeling as if her "heat is racing". HR 110-115 sustaining.

## 2023-04-02 NOTE — TOC Initial Note (Signed)
Transition of Care [facility]) - Initial/Assessment Note    Patient Details  Name: Gina Cervantes MRN: [id number] Date of Birth: 01-19-1961  Transition of Care [facility]) CM/SW Contact:    Lanier Clam, RN Phone Number: 04/02/2023, 3:13 PM  Clinical Narrative: Spoke to patient about d/c plans-d/c plan home. Indep,has PCP,pharmacy,own transport home.                  Expected Discharge Plan: Home/Self Care Barriers to Discharge: Continued Medical Work up   Patient Goals and CMS Choice Patient states their goals for this hospitalization and ongoing recovery are:: Home CMS Medicare.gov Compare Post Acute Care list provided to:: Patient Choice offered to / list presented to : Patient [place] ownership interest in [facility].provided to:: Patient    Expected Discharge Plan and Services   Discharge Planning Services: CM Consult Post Acute Care Choice: Resumption of Svcs/PTA Provider Living arrangements for the past 2 months: Single Family Home                                      Prior Living Arrangements/Services Living arrangements for the past 2 months: Single Family Home Lives with:: Spouse Patient language and need for interpreter reviewed:: Yes Do you feel safe going back to the place where you live?: Yes      Need for Family Participation in Patient Care: Yes (Comment) Care giver support system in place?: Yes (comment)   Criminal Activity/Legal Involvement Pertinent to Current Situation/Hospitalization: No - Comment as needed  Activities of Daily Living   ADL Screening (condition at time of admission) Independently performs ADLs?: Yes (appropriate for developmental age) Is the patient deaf or have difficulty hearing?: No Does the patient have difficulty seeing, even when wearing glasses/contacts?: No Does the patient have difficulty concentrating, remembering, or making decisions?: No  Permission Sought/Granted Permission sought to share information  with : Case Manager Permission granted to share information with : Yes, Verbal Permission Granted  Share Information with NAME: Case Manager           Emotional Assessment Appearance:: Appears stated age Attitude/Demeanor/Rapport: Gracious Affect (typically observed): Accepting Orientation: : Oriented to Self, Oriented to Place, Oriented to  Time, Oriented to Situation Alcohol / Substance Use: Not Applicable Psych Involvement: No (comment)  Admission diagnosis:  Hypoxia [R09.02] Elevated troponin [R79.89] Elevated brain natriuretic peptide (BNP) level [R79.89] Acute hypoxemic respiratory failure (HCC) [J96.01] Patient Active Problem List   Diagnosis Date Noted   Acute hypoxemic respiratory failure (HCC) 04/01/2023   Elevated troponin 04/01/2023   Acute on chronic combined systolic and diastolic CHF (congestive heart failure) (HCC) 04/01/2023   Hypercarbia 04/01/2023   Pap smear abnormality of cervix/human papillomavirus (HPV) positive 02/07/2022   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   Upper airway cough syndrome 08/19/2021   Local infection due to Port-A-Cath 04/27/2021   Non-small cell lung cancer with metastasis (HCC) 12/16/2020   Lung nodules 12/01/2020   High risk human papilloma virus (HPV) infection of cervix 12/29/2019   Goals of care, counseling/discussion 12/25/2019   Malignant neoplasm of lower lobe of right lung (HCC) 06/26/2019   Malignant neoplasm of bronchus of right upper lobe (HCC) 07/16/2018   Lung cancer, left  upper lobe (HCC) 07/05/2016   Anal cancer (HCC) 08/15/2012   Breast cancer, right (HCC) 06/04/2007   PCP:  Ladene Artist, MD Pharmacy:   [facility] [id number] -  [place], [place] - [address] [place] [address] Phone: [phone] Fax: [phone]     Social Determinants of Health (SDOH) Social History: SDOH Screenings   Food Insecurity: No Food Insecurity (04/01/2023)  Housing: Low Risk   (04/01/2023)  Transportation Needs: No Transportation Needs (04/01/2023)  Utilities: Not At Risk (04/01/2023)  Depression (PHQ2-9): Low Risk  (12/27/2022)  Tobacco Use: Medium Risk (04/01/2023)   SDOH Interventions:     Readmission Risk Interventions     No data to display

## 2023-04-03 DIAGNOSIS — J9601 Acute respiratory failure with hypoxia: Secondary | ICD-10-CM | POA: Diagnosis not present

## 2023-04-03 LAB — MRSA NEXT GEN BY PCR, NASAL: MRSA by PCR Next Gen: NOT DETECTED

## 2023-04-03 LAB — BASIC METABOLIC PANEL
Anion gap: 9 (ref 5–15)
BUN: 17 mg/dL (ref 6–20)
CO2: 36 mmol/L — ABNORMAL HIGH (ref 22–32)
Calcium: 8.6 mg/dL — ABNORMAL LOW (ref 8.9–10.3)
Chloride: 91 mmol/L — ABNORMAL LOW (ref 98–111)
Creatinine, Ser: 0.78 mg/dL (ref 0.44–1.00)
GFR, Estimated: >60 mL/min (ref >60)
Glucose, Bld: 95 mg/dL (ref 70–99)
Potassium: 4.2 mmol/L (ref 3.5–5.1)
Sodium: 136 mmol/L (ref 135–145)

## 2023-04-03 LAB — BRAIN NATRIURETIC PEPTIDE: B Natriuretic Peptide: 222.4 pg/mL — ABNORMAL HIGH (ref 0.0–100.0)

## 2023-04-03 MED ORDER — FUROSEMIDE 10 MG/ML IJ SOLN
40.0000 mg | Freq: Two times a day (BID) | INTRAMUSCULAR | Status: DC
  Administered 2023-04-03 – 2023-04-05 (×5): 40 mg via INTRAVENOUS
  Filled 2023-04-03 (×5): qty 4

## 2023-04-03 MED ORDER — MAGNESIUM OXIDE -MG SUPPLEMENT 400 (240 MG) MG PO TABS
800.0000 mg | ORAL_TABLET | Freq: Two times a day (BID) | ORAL | Status: DC
  Administered 2023-04-03 – 2023-04-05 (×6): 800 mg via ORAL
  Filled 2023-04-03 (×6): qty 2

## 2023-04-03 NOTE — Plan of Care (Signed)
  Problem: Education: Goal: Knowledge of General Education information will improve Description: Including pain rating scale, medication(s)/side effects and non-pharmacologic comfort measures Outcome: Progressing   Problem: Clinical Measurements: Goal: Will remain free from infection Outcome: Progressing   Problem: Clinical Measurements: Goal: Respiratory complications will improve Outcome: Not Progressing   Problem: Nutrition: Goal: Adequate nutrition will be maintained Outcome: Progressing   Problem: Coping: Goal: Level of anxiety will decrease Outcome: Progressing   Problem: Pain Management: Goal: General experience of comfort will improve Outcome: Progressing

## 2023-04-03 NOTE — Progress Notes (Signed)
Mobility Specialist - Progress Note  Pre-mobility: 97 bpm HR, 95% SpO2 During mobility: 121 bpm HR, 90% SpO2 Post-mobility: 106 bpm HR, 93% SPO2   04/03/23 1358  Mobility  Activity Ambulated independently in hallway  Level of Assistance Independent  Assistive Device None  Distance Ambulated (ft) 500 ft  Range of Motion/Exercises Active  Activity Response Tolerated well  Mobility Referral Yes  $Mobility charge 1 Mobility  Mobility Specialist Start Time (ACUTE ONLY) 1345  Mobility Specialist Stop Time (ACUTE ONLY) 1358  Mobility Specialist Time Calculation (min) (ACUTE ONLY) 13 min   Pt was found in bed and agreeable to ambulate. No complaints with session. At EOS returned to recliner chair with all needs met. Call bell in reach.  Billey Chang Mobility Specialist

## 2023-04-03 NOTE — Plan of Care (Signed)
  Problem: Education: Goal: Knowledge of General Education information will improve Description: Including pain rating scale, medication(s)/side effects and non-pharmacologic comfort measures Outcome: Progressing   Problem: Health Behavior/Discharge Planning: Goal: Ability to manage health-related needs will improve Outcome: Progressing   Problem: Nutrition: Goal: Adequate nutrition will be maintained Outcome: Progressing   Problem: Elimination: Goal: Will not experience complications related to bowel motility Outcome: Progressing Goal: Will not experience complications related to urinary retention Outcome: Progressing   Problem: Skin Integrity: Goal: Risk for impaired skin integrity will decrease Outcome: Progressing   Problem: Clinical Measurements: Goal: Respiratory complications will improve Outcome: Not Progressing   Problem: Activity: Goal: Risk for activity intolerance will decrease Outcome: Not Progressing   Problem: Coping: Goal: Level of anxiety will decrease Outcome: Not Progressing

## 2023-04-03 NOTE — Progress Notes (Signed)
Mobility Specialist - Progress Note  (4L [place]) Pre-mobility: 106 bpm HR, 93% SpO2 During mobility: 122 bpm HR, 89% SpO2 Post-mobility: 93 bpm HR, 94% SPO2   04/03/23 1034  Mobility  Activity Ambulated independently in hallway  Level of Assistance Independent  Assistive Device None  Distance Ambulated (ft) 300 ft  Range of Motion/Exercises Active  Activity Response Tolerated well  Mobility Referral Yes  $Mobility charge 1 Mobility  Mobility Specialist Start Time (ACUTE ONLY) 1020  Mobility Specialist Stop Time (ACUTE ONLY) 1034  Mobility Specialist Time Calculation (min) (ACUTE ONLY) 14 min   Pt was found in room and agreeable to ambulate. No complaints with session. At EOS returned to recliner chair with all needs met. Call bell in reach.  Billey Chang Mobility Specialist

## 2023-04-03 NOTE — Progress Notes (Signed)
HOSPITALIST ROUNDING NOTE Gina Cervantes VQM:[id number]  DOB: 1960-08-30  DOA: 04/01/2023  PCP: Ladene Artist, MD  04/03/2023,11:04 AM   LOS: 2 days      Code Status: Full  From: Home  current Dispo: Likely home     62 year old white female With moderately differentiated squamous cell anal cancer status post radiation mitomycin and 5-fluorouracil completing chemo 10/01/2012 and radiation 10/10/2012 Right breast cancer with neoadjuvant Taxol Herceptin with-finished tamoxifen now on Arimidex Anemia secondary to chemotherapy Known underlying right lung radiation changes last seen in office by Dr. Truett Perna 03/15/2023-03/12/2023 PET scan showed fibrosis and lung stable mediastinal nodes etc. Last echocardiogram 12/2020 EF 45-50% global hypokinesis grade 1 DD circumferential pericardial effusion  Presented to emergency room 11/20 for 2 days SOB cough chronic lower extremity edema does not use home oxygen She contemplated leaving AGAINST MEDICAL ADVICE but t and then decided to stay Workup revealed sodium 133 chloride 92 BUN/creatinine 18/0.7 BNP 737 troponin trend flat 20 range WBC 5 hemoglobin 16 platelet 221 procalcitonin less than 0.1 CXR irregular a posterior right upper and middle lobe opacity progressive CT chest no pulmonary embolism no new lung mass trace right pleural effusion masslike opacities in bilateral lungs groundglass opacities left lung similar to PET scan  Admitted for shortness of breath-given Lasix IV 20 x 1 11/25 echocardiogram = EF 45% decreased function not really changed from 2022 study-D-shaped septum suggestive of RV pressure volume overload moderate reduction in right systolic function   Plan  HFpEF versus underlying fibrosis from lung XRT/lung masses--does have pleural effusions on CT scan Increase Lasix to 40 mg IV twice daily already -4 L weight down 68 to 65 kg get standing weights May need pulmonology input not emergently-if does not improve ---I suspect the  fibrosis from the XRT might of caused some right heart failure which is what we are seeing on the echo At baseline very active and non-O2 dependant-overall has improved Watch alkalosis with diuresis BNP is down from 700-200 stop checking  Replace magnesium with oral meds  Anal cancer, right breast cancer Defer to Dr. Joline Salt not suspect lymphangitic spread-may have fibrosis 2/2 XRT  Macrocytic anemia with component of anemia of malignancy Hemoglobin is quite stable does not need workup  Underlying COPD? Contionue breztri/Albuterol   DVT prophylaxis: loveneox  Status is: Inpatient Remains inpatient appropriate because: requires further work-up      Subjective:  Doing fair Somewhat emotional No chest pain No fever  Objective + exam Vitals:   04/03/23 0206 04/03/23 0617 04/03/23 0836 04/03/23 0858  BP: 116/72 112/65 112/65   Pulse: 97 86 86   Resp: 17     Temp: 98.2 F (36.8 C) 98.3 F (36.8 C) 98.3 F (36.8 C)   TempSrc: Oral Oral Oral   SpO2: 95% 93% 93% 94%  Weight:   65.8 kg   Height:       Filed Weights   04/01/23 1855 04/03/23 0836  Weight: 68.4 kg 65.8 kg    Examination:  EOMI NCAT no focal deficit No wheeze Mild JVD ROM intact Abdomen soft no rebound no guarding Neuro intact    Data Reviewed: reviewed   CBC    Component Value Date/Time   WBC 4.2 04/02/2023 0057   RBC 4.02 04/02/2023 0057   HGB 15.0 04/02/2023 0057   HGB 14.0 08/08/2021 1440   HGB 13.5 01/02/2013 1510   HCT 44.2 04/02/2023 0057   HCT 39.1 01/02/2013 1510   PLT 210 04/02/2023 0057  PLT 224 08/08/2021 1440   PLT 240 01/02/2013 1510   MCV 110.0 (H) 04/02/2023 0057   MCV 105.7 (H) 01/02/2013 1510   MCH 37.3 (H) 04/02/2023 0057   MCHC 33.9 04/02/2023 0057   RDW 18.4 (H) 04/02/2023 0057   RDW 13.6 01/02/2013 1510   LYMPHSABS 0.8 04/01/2023 1141   LYMPHSABS 1.6 01/02/2013 1510   MONOABS 0.8 04/01/2023 1141   MONOABS 0.7 01/02/2013 1510   EOSABS 0.1 04/01/2023  1141   EOSABS 0.2 01/02/2013 1510   BASOSABS 0.0 04/01/2023 1141   BASOSABS 0.0 01/02/2013 1510      Latest Ref Rng & Units 04/03/2023    5:06 AM 04/02/2023   12:57 AM 04/01/2023   11:41 AM  CMP  Glucose 70 - 99 mg/dL 95  601  97   BUN 6 - 20 mg/dL 17  18  18    Creatinine 0.44 - 1.00 mg/dL 0.93  2.35  5.73   Sodium 135 - 145 mmol/L 136  137  133   Potassium 3.5 - 5.1 mmol/L 4.2  4.1  4.6   Chloride 98 - 111 mmol/L 91  95  92   CO2 22 - 32 mmol/L 36  32  30   Calcium 8.9 - 10.3 mg/dL 8.6  8.5  9.0   Total Protein 6.5 - 8.1 g/dL  6.7  7.4   Total Bilirubin <1.2 mg/dL  0.8  0.8   Alkaline Phos 38 - 126 U/L  57  65   AST 15 - 41 U/L  25  29   ALT 0 - 44 U/L  26  29      Scheduled Meds:  Budeson-Glycopyrrol-Formoterol  2 puff Inhalation BID   enoxaparin (LOVENOX) injection  40 mg Subcutaneous Q24H   furosemide  40 mg Intravenous BID   magnesium oxide  800 mg Oral BID   sodium chloride flush  3 mL Intravenous Q12H   Continuous Infusions:    Time  45  Rhetta Mura, MD  [facility]

## 2023-04-04 DIAGNOSIS — J9601 Acute respiratory failure with hypoxia: Secondary | ICD-10-CM | POA: Diagnosis not present

## 2023-04-04 LAB — BASIC METABOLIC PANEL
Anion gap: 11 (ref 5–15)
BUN: 20 mg/dL (ref 6–20)
CO2: 39 mmol/L — ABNORMAL HIGH (ref 22–32)
Calcium: 8.7 mg/dL — ABNORMAL LOW (ref 8.9–10.3)
Chloride: 84 mmol/L — ABNORMAL LOW (ref 98–111)
Creatinine, Ser: 0.7 mg/dL (ref 0.44–1.00)
GFR, Estimated: >60 mL/min (ref >60)
Glucose, Bld: 99 mg/dL (ref 70–99)
Potassium: 4.1 mmol/L (ref 3.5–5.1)
Sodium: 134 mmol/L — ABNORMAL LOW (ref 135–145)

## 2023-04-04 NOTE — Hospital Course (Addendum)
Brief hospital course: PMH of anal cancer, breast cancer, lung cancer, treated with radiation and chemotherapy present to the hospital with complaints of cough and shortness of breath. Echocardiogram shows stable EF at 45% but shows evidence of worsening right sided heart failure. Oncology following. Cardiology consulted.  Underwent right and left heart catheter found to have group 3 pulmonary hypertension.  Monitor overnight.  Assessment and Plan: Acute on chronic biventricular failure. Cardiology consulted. Received IV Lasix. No chest pain. No concerns for ACS. Monitor for now. Cardiology consulted. Underwent right and left heart cath.  No obstructive disease.  Right heart catheter confirms group 3 pulmonary hypertension.  Outpatient follow-up with pulmonary recommended.  Initiating GDMT.  Multiple malignancies. Outpatient oncology follow-up. Currently do not think that lymphangitic spread is happening. Possible fibrosis cannot be ruled out.  Possible pulmonary fibrosis from radiation. Pulmonary hypertension Acute hypoxia. Saturation dropping to 78% on room air on ambulation. Will require oxygen on discharge. Discussed with pulmonary, outpatient follow-up recommended.  Concern for COPD. Currently does not appear to have any exacerbation. For now we will monitor.  Anemia. H&H stable.  Monitor.  Anxiety. Monitor  Hyponatremia. Likely from excessive water intake. Will initiate free water restriction and monitor.

## 2023-04-04 NOTE — Progress Notes (Signed)
[facility] Progress Note Patient: Gina Cervantes JYN:[id number] DOB: [birth year] DOA: 04/01/2023  DOS: the patient was seen and examined on 04/04/2023  Brief hospital course: PMH of anal cancer, breast cancer, lung cancer, treated with radiation and chemotherapy present to the hospital with complaints of cough and shortness of breath. Echocardiogram shows stable EF at 45% but shows evidence of worsening right sided heart failure. Oncology following. Cardiology consulted.  Assessment and Plan: Acute on chronic biventricular failure. Cardiology consulted. Receiving IV Lasix. Unsure whether patient actually has any prior chemotherapy rate related injury. No chest pain. No concerns for ACS. Monitor for now.  Multiple malignancies. Outpatient oncology follow-up. Currently do not think that lymphangitic spread is happening. Possible fibrosis cannot be ruled out.  Possible pulmonary fibrosis from radiation. May require oxygen on discharge. May require outpatient follow-up with pulmonary.  Concern for COPD. Currently does not appear to have any exacerbation. For now we will monitor.  Anemia. H&H stable.  Monitor.  Anxiety. Monitor   Subjective: No nausea no vomiting no fever no chills.  Continues to have shortness of breath.  Physical Exam: General: in Mild distress, No Rash Cardiovascular: S1 and S2 Present, No Murmur Respiratory: Good respiratory effort, Bilateral Air entry present. No Crackles, No wheezes Abdomen: Bowel Sound present, No tenderness Extremities: Bilateral edema Neuro: Alert and oriented x3, no new focal deficit  Data Reviewed: I have Reviewed nursing notes, Vitals, and Lab results. Since last encounter, pertinent lab results CBC and BMP   . I have ordered test including CBC and BMP  . I have discussed pt's care plan and test results with oncology and cardiology  .   Disposition: Status is: Inpatient Remains inpatient appropriate because:  Monitor for improvement in oxygenation currently 88% on room air.  enoxaparin (LOVENOX) injection 40 mg Start: 04/01/23 2200   Family Communication: No one at bedside Level of care: Telemetry   Vitals:   04/03/23 2146 04/04/23 0436 04/04/23 0722 04/04/23 1331  BP: 104/74 (!) 98/52  93/83  Pulse: 89 77  88  Resp: 18 18  20   Temp: 98.2 F (36.8 C) 98.8 F (37.1 C)  99 F (37.2 C)  TempSrc: Oral Oral  Oral  SpO2: 95% 98%  92%  Weight:   68 kg   Height:         Author: Lynden Oxford, MD 04/04/2023 7:49 PM  Please look on [email or web address] to find out who is on call.

## 2023-04-04 NOTE — Progress Notes (Signed)
Mobility Specialist - Progress Note   04/04/23 1041  Mobility  Activity Ambulated with assistance in hallway  Level of Assistance Standby assist, set-up cues, supervision of patient - no hands on  Assistive Device None  Distance Ambulated (ft) 500 ft  Range of Motion/Exercises Active  Activity Response Tolerated well  Mobility Referral Yes  $Mobility charge 1 Mobility  Mobility Specialist Start Time (ACUTE ONLY) [id number]  Mobility Specialist Stop Time (ACUTE ONLY) 1008  Mobility Specialist Time Calculation (min) (ACUTE ONLY) 12 min   Received in bed and agreed to mobility.   Nurse requested Mobility Specialist to perform oxygen saturation test with pt which includes removing pt from oxygen both at rest and while ambulating.  Below are the results from that testing.     Patient Saturations on Room Air at Rest = spO2 90%  Patient Saturations on Room Air while Ambulating = sp02 85% .  Rested and performed pursed lip breathing for 1 minute with sp02 at 96%.  Patient Saturations on 1-2 Liters of oxygen while Ambulating = sp02 95%  Bumped to 3L SpO2% hovered around 90%.  At end of testing pt left in room on 3 Liters of oxygen.  Reported results to nurse.   Returned to chair with all needs met.   Marilynne Halsted Mobility Specialist

## 2023-04-05 DIAGNOSIS — I2729 Other secondary pulmonary hypertension: Secondary | ICD-10-CM

## 2023-04-05 DIAGNOSIS — I50813 Acute on chronic right heart failure: Secondary | ICD-10-CM

## 2023-04-05 DIAGNOSIS — J9601 Acute respiratory failure with hypoxia: Secondary | ICD-10-CM | POA: Diagnosis not present

## 2023-04-05 DIAGNOSIS — I2609 Other pulmonary embolism with acute cor pulmonale: Secondary | ICD-10-CM | POA: Diagnosis not present

## 2023-04-05 LAB — BASIC METABOLIC PANEL
Anion gap: 9 (ref 5–15)
Anion gap: 12 (ref 5–15)
Anion gap: 12 (ref 5–15)
BUN: 26 mg/dL — ABNORMAL HIGH (ref 6–20)
BUN: 24 mg/dL — ABNORMAL HIGH (ref 6–20)
BUN: 37 mg/dL — ABNORMAL HIGH (ref 6–20)
CO2: 35 mmol/L — ABNORMAL HIGH (ref 22–32)
CO2: 35 mmol/L — ABNORMAL HIGH (ref 22–32)
CO2: 32 mmol/L (ref 22–32)
Calcium: 8.6 mg/dL — ABNORMAL LOW (ref 8.9–10.3)
Calcium: 9 mg/dL (ref 8.9–10.3)
Calcium: 9 mg/dL (ref 8.9–10.3)
Chloride: 81 mmol/L — ABNORMAL LOW (ref 98–111)
Chloride: 79 mmol/L — ABNORMAL LOW (ref 98–111)
Chloride: 83 mmol/L — ABNORMAL LOW (ref 98–111)
Creatinine, Ser: 0.85 mg/dL (ref 0.44–1.00)
Creatinine, Ser: 0.83 mg/dL (ref 0.44–1.00)
Creatinine, Ser: 1.37 mg/dL — ABNORMAL HIGH (ref 0.44–1.00)
GFR, Estimated: >60 mL/min (ref >60)
GFR, Estimated: >60 mL/min (ref >60)
GFR, Estimated: 44 mL/min — ABNORMAL LOW (ref >60)
Glucose, Bld: 98 mg/dL (ref 70–99)
Glucose, Bld: 98 mg/dL (ref 70–99)
Glucose, Bld: 107 mg/dL — ABNORMAL HIGH (ref 70–99)
Potassium: 4 mmol/L (ref 3.5–5.1)
Potassium: 4.4 mmol/L (ref 3.5–5.1)
Potassium: 5 mmol/L (ref 3.5–5.1)
Sodium: 125 mmol/L — ABNORMAL LOW (ref 135–145)
Sodium: 126 mmol/L — ABNORMAL LOW (ref 135–145)
Sodium: 127 mmol/L — ABNORMAL LOW (ref 135–145)

## 2023-04-05 LAB — CBC
HCT: 48.3 % — ABNORMAL HIGH (ref 36.0–46.0)
Hemoglobin: 16.5 g/dL — ABNORMAL HIGH (ref 12.0–15.0)
MCH: 36.8 pg — ABNORMAL HIGH (ref 26.0–34.0)
MCHC: 34.2 g/dL (ref 30.0–36.0)
MCV: 107.8 fL — ABNORMAL HIGH (ref 80.0–100.0)
Platelets: 191 10*3/uL (ref 150–400)
RBC: 4.48 MIL/uL (ref 3.87–5.11)
RDW: 16.4 % — ABNORMAL HIGH (ref 11.5–15.5)
WBC: 5 10*3/uL (ref 4.0–10.5)
nRBC: 0 % (ref 0.0–0.2)

## 2023-04-05 LAB — MAGNESIUM: Magnesium: 2 mg/dL (ref 1.7–2.4)

## 2023-04-05 LAB — OSMOLALITY: Osmolality: 282 mosm/kg (ref 275–295)

## 2023-04-05 MED ORDER — CARVEDILOL 3.125 MG PO TABS
3.1250 mg | ORAL_TABLET | Freq: Two times a day (BID) | ORAL | Status: DC
  Administered 2023-04-05 – 2023-04-07 (×3): 3.125 mg via ORAL
  Filled 2023-04-05 (×3): qty 1

## 2023-04-05 MED ORDER — SACUBITRIL-VALSARTAN 24-26 MG PO TABS
1.0000 | ORAL_TABLET | Freq: Two times a day (BID) | ORAL | Status: DC
  Administered 2023-04-05: 1 via ORAL
  Filled 2023-04-05 (×4): qty 1

## 2023-04-05 MED ORDER — FUROSEMIDE 10 MG/ML IJ SOLN: 40.0000 mg | Freq: Every day | INTRAMUSCULAR | Status: DC

## 2023-04-05 NOTE — Consult Note (Addendum)
Cardiology Consultation   Patient ID: Gina Cervantes MRN: [id number]; DOB: [birth year]  Admit date: 04/01/2023 Date of Consult: 04/05/2023  PCP:  Ladene Artist, MD   [facility] Providers Cardiologist:  None        Patient Profile:   Gina Cervantes is a 62 y.o. female with a hx of moderately differentiated squamous cell anal cancer SP chemo therapy completed 10/02/2038 SP radiation therapy same time, right breast cancer completed chemotherapy in past now on Arimidex, SP radiation therapy as well to her chest, pulmonary fibrosis related to radiation therapy who is being seen 04/05/2023 for the evaluation of biventricular failure at the request of Lynden Oxford, MD.  History of Present Illness:   Gina Cervantes is a 62 y.o. Caucasian female patient who presented to the emergency room on 03/28/2023 with 2-day history of worsening dyspnea, worsening leg edema and acute decompensated heart failure.  Patient states that she had leg swelling that lasted a few days about 3 to 4 months ago, however it resolved spontaneously.  She has been doing well until 3 to 4 days ago prior to the admission to the hospital where she started noticing worsening leg edema and gradually worsening dyspnea as well.  Patient now states that she is feeling well, leg edema has resolved with furosemide.  Presently asymptomatic at rest.  States that she has been under her usual health including exercising regularly, without any significant dyspnea, PND or orthopnea.  Denies chest pain or palpitations.   Past Medical History:  Diagnosis Date   Anal cancer (HCC) 08/09/2012   Squamous cell   Anxiety    PANIC ATTACKS   Arthritis    Breast cancer (HCC) 2009   ER+PR+HER-2+   Ductal carcinoma (HCC) 02/2008   invasive    Heart palpitations    History of radiation therapy 09/02/12-10/10/12   anal 50.4Gy   History of shingles 10/2014   HPV (human papilloma virus) anogenital infection    vulvar/  freezing hx   Hx of radiation therapy 2020   Lung nodule    Malignant neoplasm of upper lobe of left lung (HCC) 06/2016   Personal history of chemotherapy    Personal history of radiation therapy 2010   Pneumonia    NO RECENT PROBLEMS   PONV (postoperative nausea and vomiting)    Hypotension   Radiation 10/21/08-12/08/08   Right breast 6240 cGy   Shingles 2017   Status post chemotherapy 02/2008   Taxol/Herceptin    Past Surgical History:  Procedure Laterality Date   BREAST BIOPSY Right 02/04/2008   malignant   BREAST LUMPECTOMY Right 2010   malignant   BREAST LUMPECTOMY WITH AXILLARY LYMPH NODE BIOPSY Right 5/10   Dr. Carolynne Edouard   BRONCHIAL BIOPSY  12/28/2020   Procedure: BRONCHIAL BIOPSIES;  Surgeon: Josephine Igo, DO;  Location: [facility];  Service: Pulmonary;;   BRONCHIAL BRUSHINGS  12/28/2020   Procedure: BRONCHIAL BRUSHINGS;  Surgeon: Josephine Igo, DO;  Location: [facility];  Service: Pulmonary;;   BRONCHIAL NEEDLE ASPIRATION BIOPSY  12/28/2020   Procedure: BRONCHIAL NEEDLE ASPIRATION BIOPSIES;  Surgeon: Josephine Igo, DO;  Location: [facility];  Service: Pulmonary;;   BRONCHIAL WASHINGS  12/28/2020   Procedure: BRONCHIAL WASHINGS;  Surgeon: Josephine Igo, DO;  Location: [facility];  Service: Pulmonary;;   CESAREAN SECTION     COLONOSCOPY W/ POLYPECTOMY     ENDOBRONCHIAL ULTRASOUND  12/28/2020   Procedure: ENDOBRONCHIAL ULTRASOUND;  Surgeon: Josephine Igo, DO;  Location: [facility];  Service: Pulmonary;;   EVALUATION UNDER ANESTHESIA WITH ANAL FISTULECTOMY N/A 08/09/2012   Procedure: EXAM UNDER ANESTHESIA AND BIOPSY OF ANAL MASS;  Surgeon: Adolph Pollack, MD;  Location: [facility];  Service: General;  Laterality: N/A;   IR IMAGING GUIDED PORT INSERTION  01/12/2021   IR RADIOLOGIST EVAL & MGMT  03/11/2021   IR REMOVAL TUN ACCESS W/ PORT W/O FL MOD SED  02/24/2021   KNEE ARTHROSCOPY Left    left knee   LUNG LOBECTOMY Left    PORTACATH PLACEMENT  03/2008   dr.  Carolynne Edouard    REMOVAL PORTACATH  2011   VIDEO ASSISTED THORACOSCOPY (VATS)/WEDGE RESECTION Left 07/05/2016   Procedure: VIDEO ASSISTED THORACOSCOPY (VATS)/WEDGE RESECTION left upper lobe,  lymph node dissection and placement of OnQ catheter;  Surgeon: Delight Ovens, MD;  Location: [facility] OR;  Service: Thoracic;  Laterality: Left;   VIDEO BRONCHOSCOPY N/A 07/05/2016   Procedure: VIDEO BRONCHOSCOPY;  Surgeon: Delight Ovens, MD;  Location: [facility] OR;  Service: Thoracic;  Laterality: N/A;   VIDEO BRONCHOSCOPY N/A 05/03/2018   Procedure: VIDEO BRONCHOSCOPY;  Surgeon: Delight Ovens, MD;  Location: [facility] OR;  Service: Thoracic;  Laterality: N/A;   VIDEO BRONCHOSCOPY WITH ENDOBRONCHIAL NAVIGATION N/A 05/03/2018   Procedure: VIDEO BRONCHOSCOPY WITH ENDOBRONCHIAL NAVIGATION WITH BIOPSY;  Surgeon: Delight Ovens, MD;  Location: [facility];  Service: Thoracic;  Laterality: N/A;   VIDEO BRONCHOSCOPY WITH ENDOBRONCHIAL NAVIGATION Bilateral 12/28/2020   Procedure: VIDEO BRONCHOSCOPY WITH ENDOBRONCHIAL NAVIGATION;  Surgeon: Josephine Igo, DO;  Location: [facility];  Service: Pulmonary;  Laterality: Bilateral;  ION   VIDEO BRONCHOSCOPY WITH ENDOBRONCHIAL ULTRASOUND N/A 05/03/2018   Procedure: VIDEO BRONCHOSCOPY WITH ENDOBRONCHIAL ULTRASOUND;  Surgeon: Delight Ovens, MD;  Location: [facility];  Service: Thoracic;  Laterality: N/A;   VIDEO BRONCHOSCOPY WITH RADIAL ENDOBRONCHIAL ULTRASOUND  12/28/2020   Procedure: RADIAL ENDOBRONCHIAL ULTRASOUND;  Surgeon: Josephine Igo, DO;  Location: [facility];  Service: Pulmonary;;     Home Medications:  Prior to Admission medications   Medication Sig Start Date End Date Taking? Authorizing Provider  albuterol (VENTOLIN HFA) 108 (90 Base) MCG/ACT inhaler INHALE 2 PUFFS BY MOUTH EVERY 6 HOURS AS NEEDED FOR WHEEZING OR SHORTNESS OF BREATH Patient taking differently: Inhale 2 puffs into the lungs every 6 (six) hours as needed for wheezing or shortness of breath. 02/19/23  Yes  Ladene Artist, MD  Budeson-Glycopyrrol-Formoterol (BREZTRI AEROSPHERE) 160-9-4.8 MCG/ACT AERO Inhale 2 puffs into the lungs in the morning and at bedtime. 07/10/22  Yes Icard, Bradley L, DO  CALCIUM PO Take 1 tablet by mouth daily. Dosage unknown   Yes [provider]  COVID-19 mRNA vaccine, Pfizer, (COMIRNATY) syringe Inject into the muscle. Patient taking differently: Inject 0.3 mLs into the muscle once. 03/29/23  Yes Judyann Munson, MD  LORazepam (ATIVAN) 0.5 MG tablet Take 1 tablet (0.5 mg total) by mouth every 8 (eight) hours as needed for anxiety (or nausea). 10/05/22  Yes Ladene Artist, MD    Inpatient Medications: Scheduled Meds:  Budeson-Glycopyrrol-Formoterol  2 puff Inhalation BID   enoxaparin (LOVENOX) injection  40 mg Subcutaneous Q24H   furosemide  40 mg Intravenous BID   magnesium oxide  800 mg Oral BID   sodium chloride flush  3 mL Intravenous Q12H   Continuous Infusions:  PRN Meds: acetaminophen **OR** acetaminophen, albuterol, HYDROcodone-acetaminophen, ondansetron **OR** ondansetron (ZOFRAN) IV, mouth rinse, sodium chloride flush  Allergies:   No Known Allergies  Social History:  Social History   Tobacco Use   Smoking status: Former    Current packs/day: 0.00    Types: Cigarettes    Start date: 2007    Quit date: 2015    Years since quitting: 9.9   Smokeless tobacco: Never   Tobacco comments:    stopped smoking cigarettes Jan. 2018  Substance Use Topics   Alcohol use: Not Currently    Alcohol/week: 0.0 - 5.0 standard drinks of alcohol    Comment: every day  Married   Family History:    Family History  Problem Relation Age of Onset   Lung cancer Father    Stroke Mother    Breast cancer Neg Hx     Review of Systems  Cardiovascular:  Negative for chest pain, dyspnea on exertion and leg swelling.    Physical Exam    Vitals:   04/04/23 1331 04/04/23 1956 04/05/23 0426 04/05/23 0430  BP: 93/83 122/60  110/68  Pulse: 88 98  93   Resp: 20     Temp: 99 F (37.2 C) 98.3 F (36.8 C)  98.2 F (36.8 C)  TempSrc: Oral Oral  Oral  SpO2: 92% 97%  (!) 89%  Weight:   65.3 kg   Height:       Physical Exam Neck:     Vascular: No carotid bruit or JVD.  Cardiovascular:     Rate and Rhythm: Normal rate and regular rhythm.     Pulses: Intact distal pulses.     Heart sounds: Normal heart sounds. No murmur heard.    No gallop.  Pulmonary:     Effort: Pulmonary effort is normal.     Breath sounds: Normal breath sounds.  Abdominal:     General: Bowel sounds are normal.     Palpations: Abdomen is soft.  Musculoskeletal:     Right lower leg: No edema.     Left lower leg: No edema.      Intake/Output Summary (Last 24 hours) at 04/05/2023 0821 Last data filed at 04/04/2023 1554 Gross per 24 hour  Intake 120 ml  Output 400 ml  Net -280 ml      04/05/2023    4:26 AM 04/04/2023    7:22 AM 04/03/2023    8:36 AM  Last 3 Weights  Weight (lbs) 144 lb 149 lb 14.6 oz 145 lb  Weight (kg) 65.318 kg 68 kg 65.772 kg     Net IO Since Admission: -8,102.6 mL [04/05/23 0821]  Labs   Lab Results  Component Value Date   NA 125 (L) 04/05/2023   K 4.0 04/05/2023   CO2 35 (H) 04/05/2023   GLUCOSE 98 04/05/2023   BUN 26 (H) 04/05/2023   CREATININE 0.85 04/05/2023   CALCIUM 8.6 (L) 04/05/2023   GFRNONAA >60 04/05/2023       Latest Ref Rng & Units 04/05/2023    5:29 AM 04/04/2023    5:06 AM 04/03/2023    5:06 AM  BMP  Glucose 70 - 99 mg/dL 98  99  95   BUN 6 - 20 mg/dL 26  20  17    Creatinine 0.44 - 1.00 mg/dL 8.75  6.43  3.29   Sodium 135 - 145 mmol/L 125  134  136   Potassium 3.5 - 5.1 mmol/L 4.0  4.1  4.2   Chloride 98 - 111 mmol/L 81  84  91   CO2 22 - 32 mmol/L 35  39  36   Calcium 8.9 - 10.3 mg/dL  8.6  8.7  8.6        Latest Ref Rng & Units 04/05/2023    5:29 AM 04/02/2023   12:57 AM 04/01/2023   11:41 AM  CBC  WBC 4.0 - 10.5 K/uL 5.0  4.2  5.0   Hemoglobin 12.0 - 15.0 g/dL 16.1  09.6  04.5    Hematocrit 36.0 - 46.0 % 48.3  44.2  49.0   Platelets 150 - 400 K/uL 191  210  221     Lab Results  Component Value Date   CHOL 224 (H) 12/05/2018   HDL 87 12/05/2018   LDLCALC 124 (H) 12/05/2018   TRIG 65 12/05/2018   CHOLHDL 2.6 12/05/2018    Lab Results  Component Value Date   TSH 2.553 03/28/2021   Lab Results  Component Value Date   HGBA1C 5.8 (H) 12/05/2018   High Sensitivity Troponin:   Recent Labs  Lab 04/01/23 1141 04/01/23 1345 04/01/23 2120 04/02/23 0057  TROPONINIHS 29* 27* 23* 22*     BNP (last 3 results) Recent Labs    04/01/23 1141 04/03/23 0506  BNP 737.4* 222.4*   Tele/EKG/Cardiac studies   EKG:  EKG 04/01/2023: Sinus tachycardia at rate of 115 bpm, P pulmonale, right axis deviation, left posterior fascicular block.  Incomplete right bundle branch block.  Consider RVH.  Nonspecific T abnormality.  Compared to 12/28/2020, P pulmonale and sinus tachycardia new otherwise no change  Echocardiogram 04/02/2023:  1. Left ventricular ejection fraction, by estimation, is 45%. The left ventricle has mildly decreased function. The left ventricle demonstrates global hypokinesis. Left ventricular diastolic parameters are consistent with Grade I diastolic dysfunction  (impaired relaxation).  2. D-shaped septum suggestive of RV pressure/volume overload. Right ventricular systolic function is moderately reduced. The right ventricular size is moderately enlarged. There is mildly elevated pulmonary artery systolic pressure. The estimated right ventricular systolic pressure is 40.9 mmHg.  3. Right atrial size was mildly dilated.  4. The mitral valve is normal in structure. No evidence of mitral valve regurgitation. No evidence of mitral stenosis.  5. The aortic valve is tricuspid. There is mild calcification of the aortic valve. Aortic valve regurgitation is not visualized. No aortic stenosis is present.  6. The inferior vena cava is normal in size with <50% respiratory  variability, suggesting right atrial pressure of 8 mmHg. 7. Echocardiogram personally reviewed, compared to 12/24/2020, LVEF has slightly improved, RV dilatation and RV systolic dysfunction  worse.  D-shaped septum suggestive of pressure and volume overload is new.  Radiology  CT angio chest 04/01/2023: 1. No embolism to the proximal subsegmental pulmonary artery level. 2. No new lung mass, consolidation or pneumothorax. There is trace right pleural effusion. No left pleural effusion. 3. Redemonstration of multiple masslike opacities throughout bilateral lungs as well as multiple ground-glass opacities in the left lung, which are essentially similar to the prior PET-CT scan from 03/12/2023.  Assessment & Plan .     1.  Acute on chronic cor pulmonale with right-sided heart failure 2.  Acute heart failure with reduced ejection fraction of the right ventricle. 3.  Postchemotherapy induced mild biventricular heart failure and probable nonischemic cardiomyopathy. 4.  Moderate pulmonary hypertension, WHO group 3.  Recommendation: Patient will need right heart catheterization electively to evaluate pulm hypertension.  At this point I have recommended that we start her on Entresto, also started on low-dose beta-blocker therapy and once right heart catheterization is being performed, will consider either therapy for pulmonary hypertension with vasodilating agents  or disease modifying agents.  In view of reduced LVEF, we will also proceed with cardiac catheterization both left and right.  Again this will be done in the outpatient basis.  We will continue to use as needed Lasix for now, I have discussed with her regarding avoidance of excess salt and fluid overload situations.  Her blood pressure is very soft hence difficult to use on guideline directed medical therapy including SGLT2 inhibitor and aldosterone antagonist.  Suspect her pulmonary fibrosis was related to radiation-induced injury.   Would probably recommend pulmonary evaluation with regard to ILD to get their opinion.  This can be done in the outpatient basis.  Hyponatremia is related to volume excess.  Renal function panel has already been ordered for tomorrow and she will need follow-up of sodium levels especially in view of starting Entresto.  Schedule for cardiac catheterization (Out patient), and possible angioplasty. We discussed regarding risks, benefits, alternatives to this including stress testing, CTA and continued medical therapy. Patient wants to proceed. Understands <1-2% risk of death, stroke, MI, urgent CABG, bleeding, infection, renal failure but not limited to these.   I will arrange for outpatient follow-up.  Presently she is well compensated.  Will sign off for now.  Yates Decamp, MD, [facility] 04/05/2023, 8:21 AM [facility] [address] [place], [place] [address] Phone: [phone]. Fax:  [phone]  C: [phone]

## 2023-04-05 NOTE — Progress Notes (Signed)
Mobility Specialist - Progress Note   04/05/23 0943  Mobility  Activity Ambulated with assistance in hallway  Level of Assistance Standby assist, set-up cues, supervision of patient - no hands on  Assistive Device None  Distance Ambulated (ft) 320 ft  Range of Motion/Exercises Active  Activity Response Tolerated well  Mobility Referral Yes  $Mobility charge 1 Mobility  Mobility Specialist Start Time (ACUTE ONLY) 0901  Mobility Specialist Stop Time (ACUTE ONLY) 0914  Mobility Specialist Time Calculation (min) (ACUTE ONLY) 13 min   Received in bed and agreed to mobility. Had no issues throughout session.  Started on room air with saturations at 78%. On 2L returned to 91%.  Returned to bed with all needs met.  Marilynne Halsted Mobility Specialist

## 2023-04-05 NOTE — Progress Notes (Signed)
[facility] Progress Note Patient: Gina Cervantes LOV:[id number] DOB: 1960/10/02 DOA: 04/01/2023  DOS: the patient was seen and examined on 04/05/2023  Brief hospital course: PMH of anal cancer, breast cancer, lung cancer, treated with radiation and chemotherapy present to the hospital with complaints of cough and shortness of breath. Echocardiogram shows stable EF at 45% but shows evidence of worsening right sided heart failure. Oncology following. Cardiology consulted.  Assessment and Plan: Acute on chronic biventricular failure. Cardiology consulted. Receiving IV Lasix. No chest pain. No concerns for ACS. Monitor for now. Cardiology consulted. Initiating on Coreg and Entresto. Will monitor response.  Multiple malignancies. Outpatient oncology follow-up. Currently do not think that lymphangitic spread is happening. Possible fibrosis cannot be ruled out.  Possible pulmonary fibrosis from radiation. Acute hypoxia. Saturation dropping to 78% on room air on ambulation. May require oxygen on discharge. Discussed with pulmonary, outpatient follow-up recommended.  Concern for COPD. Currently does not appear to have any exacerbation. For now we will monitor.  Anemia. H&H stable.  Monitor.  Anxiety. Monitor  Hyponatremia. Likely from excessive water intake. Will initiate free water restriction and monitor.   Subjective: No nausea no vomiting.  Breathing improving.  Physical Exam: General: in Mild distress, No Rash Cardiovascular: S1 and S2 Present, No Murmur Respiratory: Good respiratory effort, Bilateral Air entry present.  Bilateral basal crackles, No wheezes Abdomen: Bowel Sound present, No tenderness Extremities: Unchanged bilateral edema Neuro: Alert and oriented x3, no new focal deficit  Data Reviewed: I have Reviewed nursing notes, Vitals, and Lab results. Since last encounter, pertinent lab results CBC and CMP   . I have ordered test including CBC and  CMP  .   Disposition: Status is: Inpatient Remains inpatient appropriate because: Monitor for improvement in oxygenation and sodium level  enoxaparin (LOVENOX) injection 40 mg Start: 04/01/23 2200   Family Communication: No one at bedside Level of care: Telemetry   Vitals:   04/04/23 1956 04/05/23 0426 04/05/23 0430 04/05/23 1314  BP: 122/60  110/68 96/67  Pulse: 98  93 75  Resp:    16  Temp: 98.3 F (36.8 C)  98.2 F (36.8 C) 98.7 F (37.1 C)  TempSrc: Oral  Oral Oral  SpO2: 97%  (!) 89% 97%  Weight:  65.3 kg    Height:         Author: Lynden Oxford, MD 04/05/2023 7:21 PM  Please look on [email or web address] to find out who is on call.

## 2023-04-06 ENCOUNTER — Encounter (HOSPITAL_COMMUNITY)
Admission: EM | Disposition: A | Discharge status: Home / Self Care | Payer: Self-pay | Source: Home / Self Care | Attending: Internal Medicine

## 2023-04-06 DIAGNOSIS — I5043 Acute on chronic combined systolic (congestive) and diastolic (congestive) heart failure: Secondary | ICD-10-CM | POA: Diagnosis not present

## 2023-04-06 DIAGNOSIS — I50813 Acute on chronic right heart failure: Secondary | ICD-10-CM | POA: Diagnosis not present

## 2023-04-06 HISTORY — PX: RIGHT/LEFT HEART CATH AND CORONARY ANGIOGRAPHY: CATH118266

## 2023-04-06 LAB — CBC
HCT: 51.1 % — ABNORMAL HIGH (ref 36.0–46.0)
Hemoglobin: 17.1 g/dL — ABNORMAL HIGH (ref 12.0–15.0)
MCH: 36.5 pg — ABNORMAL HIGH (ref 26.0–34.0)
MCHC: 33.5 g/dL (ref 30.0–36.0)
MCV: 109.2 fL — ABNORMAL HIGH (ref 80.0–100.0)
Platelets: 223 10*3/uL (ref 150–400)
RBC: 4.68 MIL/uL (ref 3.87–5.11)
RDW: 16.2 % — ABNORMAL HIGH (ref 11.5–15.5)
WBC: 5.4 10*3/uL (ref 4.0–10.5)
nRBC: 0 % (ref 0.0–0.2)

## 2023-04-06 LAB — RENAL FUNCTION PANEL
Albumin: 3.3 g/dL — ABNORMAL LOW (ref 3.5–5.0)
Anion gap: 10 (ref 5–15)
BUN: 36 mg/dL — ABNORMAL HIGH (ref 6–20)
CO2: 34 mmol/L — ABNORMAL HIGH (ref 22–32)
Calcium: 8.8 mg/dL — ABNORMAL LOW (ref 8.9–10.3)
Chloride: 83 mmol/L — ABNORMAL LOW (ref 98–111)
Creatinine, Ser: 1.19 mg/dL — ABNORMAL HIGH (ref 0.44–1.00)
GFR, Estimated: 52 mL/min — ABNORMAL LOW (ref >60)
Glucose, Bld: 98 mg/dL (ref 70–99)
Phosphorus: 4.2 mg/dL (ref 2.5–4.6)
Potassium: 4.4 mmol/L (ref 3.5–5.1)
Sodium: 127 mmol/L — ABNORMAL LOW (ref 135–145)

## 2023-04-06 LAB — POCT I-STAT EG7
Acid-Base Excess: 14 mmol/L — ABNORMAL HIGH (ref 0.0–2.0)
Acid-Base Excess: 15 mmol/L — ABNORMAL HIGH (ref 0.0–2.0)
Bicarbonate: 42.3 mmol/L — ABNORMAL HIGH (ref 20.0–28.0)
Bicarbonate: 43.5 mmol/L — ABNORMAL HIGH (ref 20.0–28.0)
Calcium, Ion: 1.19 mmol/L (ref 1.15–1.40)
Calcium, Ion: 1.19 mmol/L (ref 1.15–1.40)
HCT: 53 % — ABNORMAL HIGH (ref 36.0–46.0)
HCT: 53 % — ABNORMAL HIGH (ref 36.0–46.0)
Hemoglobin: 18 g/dL — ABNORMAL HIGH (ref 12.0–15.0)
Hemoglobin: 18 g/dL — ABNORMAL HIGH (ref 12.0–15.0)
O2 Saturation: 67 %
O2 Saturation: 68 %
Potassium: 4.5 mmol/L (ref 3.5–5.1)
Potassium: 4.5 mmol/L (ref 3.5–5.1)
Sodium: 129 mmol/L — ABNORMAL LOW (ref 135–145)
Sodium: 129 mmol/L — ABNORMAL LOW (ref 135–145)
TCO2: 44 mmol/L — ABNORMAL HIGH (ref 22–32)
TCO2: 45 mmol/L — ABNORMAL HIGH (ref 22–32)
pCO2, Ven: 65.2 mm[Hg] — ABNORMAL HIGH (ref 44–60)
pCO2, Ven: 66.3 mm[Hg] — ABNORMAL HIGH (ref 44–60)
pH, Ven: 7.42 (ref 7.25–7.43)
pH, Ven: 7.425 (ref 7.25–7.43)
pO2, Ven: 36 mm[Hg] (ref 32–45)
pO2, Ven: 37 mm[Hg] (ref 32–45)

## 2023-04-06 LAB — POCT I-STAT 7, (LYTES, BLD GAS, ICA,H+H)
Acid-Base Excess: 12 mmol/L — ABNORMAL HIGH (ref 0.0–2.0)
Bicarbonate: 39.6 mmol/L — ABNORMAL HIGH (ref 20.0–28.0)
Calcium, Ion: 1.13 mmol/L — ABNORMAL LOW (ref 1.15–1.40)
HCT: 52 % — ABNORMAL HIGH (ref 36.0–46.0)
Hemoglobin: 17.7 g/dL — ABNORMAL HIGH (ref 12.0–15.0)
O2 Saturation: 95 %
Potassium: 4.3 mmol/L (ref 3.5–5.1)
Sodium: 129 mmol/L — ABNORMAL LOW (ref 135–145)
TCO2: 41 mmol/L — ABNORMAL HIGH (ref 22–32)
pCO2 arterial: 59 mm[Hg] — ABNORMAL HIGH (ref 32–48)
pH, Arterial: 7.435 (ref 7.35–7.45)
pO2, Arterial: 74 mm[Hg] — ABNORMAL LOW (ref 83–108)

## 2023-04-06 LAB — CULTURE, BLOOD (ROUTINE X 2)
Culture: NO GROWTH
Culture: NO GROWTH
Special Requests: ADEQUATE
Special Requests: ADEQUATE

## 2023-04-06 LAB — MAGNESIUM: Magnesium: 2.5 mg/dL — ABNORMAL HIGH (ref 1.7–2.4)

## 2023-04-06 SURGERY — RIGHT/LEFT HEART CATH AND CORONARY ANGIOGRAPHY
Anesthesia: LOCAL

## 2023-04-06 MED ORDER — HEPARIN (PORCINE) IN NACL 1000-0.9 UT/500ML-% IV SOLN
INTRAVENOUS | Status: DC | PRN
  Administered 2023-04-06 (×2): 500 mL

## 2023-04-06 MED ORDER — MIDAZOLAM HCL 2 MG/2ML IJ SOLN
INTRAMUSCULAR | Status: AC
Start: 2023-04-06 — End: ?
  Filled 2023-04-06: qty 2

## 2023-04-06 MED ORDER — LIDOCAINE HCL (PF) 1 % IJ SOLN
INTRAMUSCULAR | Status: DC | PRN
  Administered 2023-04-06 (×2): 2 mL

## 2023-04-06 MED ORDER — ASPIRIN 81 MG PO CHEW: 81.0000 mg | CHEWABLE_TABLET | ORAL | Status: DC

## 2023-04-06 MED ORDER — HEPARIN SODIUM (PORCINE) 1000 UNIT/ML IJ SOLN
INTRAMUSCULAR | Status: AC
Start: 2023-04-06 — End: ?
  Filled 2023-04-06: qty 10

## 2023-04-06 MED ORDER — SODIUM CHLORIDE 0.9% FLUSH
3.0000 mL | Freq: Two times a day (BID) | INTRAVENOUS | Status: DC
Start: 2023-04-06 — End: 2023-04-07
  Administered 2023-04-06: 3 mL via INTRAVENOUS

## 2023-04-06 MED ORDER — VERAPAMIL HCL 2.5 MG/ML IV SOLN
INTRAVENOUS | Status: AC
  Filled 2023-04-06: qty 2

## 2023-04-06 MED ORDER — FENTANYL CITRATE (PF) 100 MCG/2ML IJ SOLN
INTRAMUSCULAR | Status: AC
  Filled 2023-04-06: qty 2

## 2023-04-06 MED ORDER — FENTANYL CITRATE (PF) 100 MCG/2ML IJ SOLN
INTRAMUSCULAR | Status: DC | PRN
  Administered 2023-04-06: 25 ug via INTRAVENOUS

## 2023-04-06 MED ORDER — IOHEXOL 350 MG/ML SOLN
INTRAVENOUS | Status: DC | PRN
  Administered 2023-04-06: 20 mL

## 2023-04-06 MED ORDER — LIDOCAINE HCL (PF) 1 % IJ SOLN
INTRAMUSCULAR | Status: AC
  Filled 2023-04-06: qty 30

## 2023-04-06 MED ORDER — SODIUM CHLORIDE 0.9 % IV SOLN: INTRAVENOUS | Status: DC

## 2023-04-06 MED ORDER — VERAPAMIL HCL 2.5 MG/ML IV SOLN
INTRAVENOUS | Status: DC | PRN
  Administered 2023-04-06: 10 mL via INTRA_ARTERIAL

## 2023-04-06 MED ORDER — HEPARIN SODIUM (PORCINE) 1000 UNIT/ML IJ SOLN
INTRAMUSCULAR | Status: DC | PRN
  Administered 2023-04-06: 3000 [IU] via INTRAVENOUS

## 2023-04-06 MED ORDER — DAPAGLIFLOZIN PROPANEDIOL 10 MG PO TABS
10.0000 mg | ORAL_TABLET | Freq: Every day | ORAL | Status: DC
  Administered 2023-04-06 – 2023-04-07 (×2): 10 mg via ORAL
  Filled 2023-04-06 (×3): qty 1

## 2023-04-06 MED ORDER — MIDAZOLAM HCL 2 MG/2ML IJ SOLN
INTRAMUSCULAR | Status: DC | PRN
  Administered 2023-04-06: 1 mg via INTRAVENOUS

## 2023-04-06 SURGICAL SUPPLY — 11 items
CATH BALLN WEDGE 5F 110CM (CATHETERS) IMPLANT
CATH INFINITI AMBI 5FR TG (CATHETERS) IMPLANT
DEVICE RAD TR BAND REGULAR (VASCULAR PRODUCTS) IMPLANT
GLIDESHEATH SLEND A-KIT 6F 22G (SHEATH) IMPLANT
GUIDEWIRE .025 260CM (WIRE) IMPLANT
GUIDEWIRE INQWIRE 1.5J.035X260 (WIRE) IMPLANT
INQWIRE 1.5J .035X260CM (WIRE) ×1
PACK CARDIAC CATHETERIZATION (CUSTOM PROCEDURE TRAY) ×2 IMPLANT
SET ATX-X65L (MISCELLANEOUS) IMPLANT
SHEATH GLIDE SLENDER 4/5FR (SHEATH) IMPLANT
SHEATH PROBE COVER 6X72 (BAG) IMPLANT

## 2023-04-06 NOTE — Progress Notes (Addendum)
Patient Name: Gina Cervantes Date of Encounter: 04/06/2023 [facility] Cardiologist: None   Interval Summary  .    Plan for right left cardiac catheterization today.  Patient without any complaints.  Ambulating well without difficulties.  Became somewhat tearful when discussing her heart failure.  Vital Signs .    Vitals:   04/05/23 2000 04/05/23 2028 04/06/23 0500 04/06/23 0510  BP: (!) 77/47 111/62  92/64  Pulse: 87 85  81  Resp:    20  Temp: 98.3 F (36.8 C)   97.9 F (36.6 C)  TempSrc: Oral   Oral  SpO2: (!) 88%   96%  Weight:   63 kg   Height:        Intake/Output Summary (Last 24 hours) at 04/06/2023 0751 Last data filed at 04/05/2023 2200 Gross per 24 hour  Intake 240 ml  Output 1200 ml  Net -960 ml      04/06/2023    5:00 AM 04/05/2023    4:26 AM 04/04/2023    7:22 AM  Last 3 Weights  Weight (lbs) 139 lb 144 lb 149 lb 14.6 oz  Weight (kg) 63.05 kg 65.318 kg 68 kg      Telemetry/ECG    Normal sinus rhythm heart rates in the 80s- Personally Reviewed  CV Studies    Echocardiogram 04/02/2023  1. Left ventricular ejection fraction, by estimation, is 45%. The left  ventricle has mildly decreased function. The left ventricle demonstrates  global hypokinesis. Left ventricular diastolic parameters are consistent  with Grade I diastolic dysfunction  (impaired relaxation).   2. D-shaped septum suggestive of RV pressure/volume overload. Right  ventricular systolic function is moderately reduced. The right ventricular  size is moderately enlarged. There is mildly elevated pulmonary artery  systolic pressure. The estimated right  ventricular systolic pressure is 40.9 mmHg.   3. Right atrial size was mildly dilated.   4. The mitral valve is normal in structure. No evidence of mitral valve  regurgitation. No evidence of mitral stenosis.   5. The aortic valve is tricuspid. There is mild calcification of the  aortic valve. Aortic valve  regurgitation is not visualized. No aortic  stenosis is present.   6. The inferior vena cava is normal in size with <50% respiratory  variability, suggesting right atrial pressure of 8 mmHg.    Physical Exam .   GEN: No acute distress.   Neck: No JVD Cardiac: RRR, no murmurs, rubs, or gallops.  Respiratory: Crackles right lower base.  Reports this is chronic. GI: Soft, nontender, non-distended  MS: No edema  Patient Profile    Gina Cervantes is a 62 y.o. female has hx of   moderately differentiated squamous cell anal cancer SP chemo therapy completed 10/02/2038 SP radiation therapy same time, right breast cancer completed chemotherapy in past now on Arimidex, SP radiation therapy as well to her chest, pulmonary fibrosis related to radiation therapy.  Initially admitted on 03/28/2023 due to worsening complaints of shortness of breath and leg edema.  Cardiology asked to see due to new biventricular heart failure. Assessment & Plan .     New heart failure with mildly reduced ejection fraction (45%) Pulmonary hypertension, WHO group 3 Complaining of shortness of breath and leg edema for the past few months.  Echocardiogram showed EF 45%, D-shaped septum with moderately reduced RV function/enlargement.  PASP 40.9.  Pulmonary hypertension likely related to postchemotherapy medications resulting in pulmonary fibrosis.  She has been diuresed with IV Lasix  40mg  and appears euvolemic.  Plan for today is right and left heart catheterization to further define possible coronary artery disease and pulmonary hypertension. Will stop IV diuretics. Reports hypotension/soft pressures at home. BP here 90s today.  Started on Entresto 24-26 mg twice daily, carvedilol 3.125 mg twice daily. Would hold for today and discharge on SGLT2 inhibitor possibly Toprol-XL rather than carvedilol.  Wouldnt initiate Entresto/ARB right now. Discussed ongoing management for heart failure including routine monitoring of daily  weights and blood pressure.  She has been instructed to bring this information to her upcoming appointment. Further recommendations post catheterization. Weight today is 139 pounds (dry weight) Will need repeat labs in 1 to 2 weeks with initiation of GDMT.  Informed Consent   Shared Decision Making/Informed Consent The risks [stroke (1 in 1000), death (1 in 1000), kidney failure [usually temporary] (1 in 500), bleeding (1 in 200), allergic reaction [possibly serious] (1 in 200)], benefits (diagnostic support and management of coronary artery disease) and alternatives of a cardiac catheterization were discussed in detail with [name] and she is willing to proceed.      Multiple cancers Likely contributing factor of cardiomyopathy.  Will follow oncology outpatient.  Pulmonary fibrosis Likely secondary to radiation therapy.  Chronically is borderline hypoxic in the 90s.  Hyponatremia Continues to downtrend but has been stable last 2 days.  Do not think this is related to volume.  She is on free water restriction  For questions or updates, please contact [facility] Please consult [email or web address] for contact info under  Signed, Abagail Kitchens, PA-C   As above, patient seen and examined.  She denies dyspnea or chest pain.  She is for right and left cardiac catheterization today per Dr. Jacinto Halim for mild cardiomyopathy and pulmonary hypertension.  Her cardiomyopathy is likely secondary to history of chemotherapy and her pulmonary hypertension secondary to lung disease (prior radiation, lung resection and interstitial lung disease).  Her systolic blood pressure decreased to 77 yesterday.  Will discontinue Entresto and continue low-dose carvedilol.  She is euvolemic on examination.  Would likely discharge with Lasix 20 mg daily as needed.  Sodium remains decreased but should improve over the next several days.  Will hold on further diuresis. Olga Millers, MD

## 2023-04-06 NOTE — Progress Notes (Signed)
Ambulated in hallway. 02 sat dropped to 78% on room air. Placed on 2L , recovered to 91%.

## 2023-04-06 NOTE — Progress Notes (Signed)
SATURATION QUALIFICATIONS: (This note is used to comply with regulatory documentation for home oxygen)  Patient Saturations on Room Air at Rest = 90%  Patient Saturations on Room Air while Ambulating = 78%  Patient Saturations on 2 Liters of oxygen while Ambulating = 91%  Please briefly explain why patient needs home oxygen:Desat's with ambulating.

## 2023-04-06 NOTE — Progress Notes (Signed)
Her options are management of HFrEF-right ventricular failure, nonischemic cardiomyopathy with biventricular failure and optimization of the medical therapy with carvedilol, Entresto and have started her on low-dose of Farxiga 5 mg daily in view of very soft blood pressure.  She has WHO class III PAH, she may benefit from Tyvaso.  She will need pulmonary evaluation for the same.  In the interim hopefully if her LVEF and RV systolic function improves with heart failure management.  I discussed with Dr. Lynden Oxford and have updated her husband as well.  Yates Decamp, MD, [facility] 04/06/2023, 12:34 PM [facility] [address] [place], [place] [address] Phone: [phone]. Fax:  [phone]

## 2023-04-06 NOTE — H&P (View-Only) (Signed)
Patient Name: Gina Cervantes Date of Encounter: 04/06/2023 [facility] Cardiologist: None   Interval Summary  .    Plan for right left cardiac catheterization today.  Patient without any complaints.  Ambulating well without difficulties.  Became somewhat tearful when discussing her heart failure.  Vital Signs .    Vitals:   04/05/23 2000 04/05/23 2028 04/06/23 0500 04/06/23 0510  BP: (!) 77/47 111/62  92/64  Pulse: 87 85  81  Resp:    20  Temp: 98.3 F (36.8 C)   97.9 F (36.6 C)  TempSrc: Oral   Oral  SpO2: (!) 88%   96%  Weight:   63 kg   Height:        Intake/Output Summary (Last 24 hours) at 04/06/2023 0751 Last data filed at 04/05/2023 2200 Gross per 24 hour  Intake 240 ml  Output 1200 ml  Net -960 ml      04/06/2023    5:00 AM 04/05/2023    4:26 AM 04/04/2023    7:22 AM  Last 3 Weights  Weight (lbs) 139 lb 144 lb 149 lb 14.6 oz  Weight (kg) 63.05 kg 65.318 kg 68 kg      Telemetry/ECG    Normal sinus rhythm heart rates in the 80s- Personally Reviewed  CV Studies    Echocardiogram 04/02/2023  1. Left ventricular ejection fraction, by estimation, is 45%. The left  ventricle has mildly decreased function. The left ventricle demonstrates  global hypokinesis. Left ventricular diastolic parameters are consistent  with Grade I diastolic dysfunction  (impaired relaxation).   2. D-shaped septum suggestive of RV pressure/volume overload. Right  ventricular systolic function is moderately reduced. The right ventricular  size is moderately enlarged. There is mildly elevated pulmonary artery  systolic pressure. The estimated right  ventricular systolic pressure is 40.9 mmHg.   3. Right atrial size was mildly dilated.   4. The mitral valve is normal in structure. No evidence of mitral valve  regurgitation. No evidence of mitral stenosis.   5. The aortic valve is tricuspid. There is mild calcification of the  aortic valve. Aortic valve  regurgitation is not visualized. No aortic  stenosis is present.   6. The inferior vena cava is normal in size with <50% respiratory  variability, suggesting right atrial pressure of 8 mmHg.    Physical Exam .   GEN: No acute distress.   Neck: No JVD Cardiac: RRR, no murmurs, rubs, or gallops.  Respiratory: Crackles right lower base.  Reports this is chronic. GI: Soft, nontender, non-distended  MS: No edema  Patient Profile    Gina Cervantes is a 62 y.o. female has hx of   moderately differentiated squamous cell anal cancer SP chemo therapy completed 10/02/2038 SP radiation therapy same time, right breast cancer completed chemotherapy in past now on Arimidex, SP radiation therapy as well to her chest, pulmonary fibrosis related to radiation therapy.  Initially admitted on 03/28/2023 due to worsening complaints of shortness of breath and leg edema.  Cardiology asked to see due to new biventricular heart failure. Assessment & Plan .     New heart failure with mildly reduced ejection fraction (45%) Pulmonary hypertension, WHO group 3 Complaining of shortness of breath and leg edema for the past few months.  Echocardiogram showed EF 45%, D-shaped septum with moderately reduced RV function/enlargement.  PASP 40.9.  Pulmonary hypertension likely related to postchemotherapy medications resulting in pulmonary fibrosis.  She has been diuresed with IV Lasix  40mg  and appears euvolemic.  Plan for today is right and left heart catheterization to further define possible coronary artery disease and pulmonary hypertension. Will stop IV diuretics. Reports hypotension/soft pressures at home. BP here 90s today.  Started on Entresto 24-26 mg twice daily, carvedilol 3.125 mg twice daily. Would hold for today and discharge on SGLT2 inhibitor possibly Toprol-XL rather than carvedilol.  Wouldnt initiate Entresto/ARB right now. Discussed ongoing management for heart failure including routine monitoring of daily  weights and blood pressure.  She has been instructed to bring this information to her upcoming appointment. Further recommendations post catheterization. Weight today is 139 pounds (dry weight) Will need repeat labs in 1 to 2 weeks with initiation of GDMT.  Informed Consent   Shared Decision Making/Informed Consent The risks [stroke (1 in 1000), death (1 in 1000), kidney failure [usually temporary] (1 in 500), bleeding (1 in 200), allergic reaction [possibly serious] (1 in 200)], benefits (diagnostic support and management of coronary artery disease) and alternatives of a cardiac catheterization were discussed in detail with Gina Cervantes and she is willing to proceed.      Multiple cancers Likely contributing factor of cardiomyopathy.  Will follow oncology outpatient.  Pulmonary fibrosis Likely secondary to radiation therapy.  Chronically is borderline hypoxic in the 90s.  Hyponatremia Continues to downtrend but has been stable last 2 days.  Do not think this is related to volume.  She is on free water restriction  For questions or updates, please contact [facility] Please consult [email or web address] for contact info under  Signed, Abagail Kitchens, PA-C   As above, patient seen and examined.  She denies dyspnea or chest pain.  She is for right and left cardiac catheterization today per Dr. Jacinto Halim for mild cardiomyopathy and pulmonary hypertension.  Her cardiomyopathy is likely secondary to history of chemotherapy and her pulmonary hypertension secondary to lung disease (prior radiation, lung resection and interstitial lung disease).  Her systolic blood pressure decreased to 77 yesterday.  Will discontinue Entresto and continue low-dose carvedilol.  She is euvolemic on examination.  Would likely discharge with Lasix 20 mg daily as needed.  Sodium remains decreased but should improve over the next several days.  Will hold on further diuresis. Olga Millers, MD

## 2023-04-06 NOTE — TOC Progression Note (Signed)
Transition of Care [facility]) - Progression Note    Patient Details  Name: Gina Cervantes MRN: [id number] Date of Birth: 10/27/1960  Transition of Care [facility]) CM/SW Contact  Adrian Prows, RN Phone Number: 04/06/2023, 4:57 PM  Clinical Narrative:    Orders received for home oxygen; spoke w/ pt in room; she agrees to recc service; she does not have an agency preference; pt confirmed d/c address [address] [place], [address]; contacted Vaughan Basta at [facility]; travel tank will be delivered to pt's room; agency contact info placed in follow up provider section of d/c instructions; pt notified.   Expected Discharge Plan: Home/Self Care Barriers to Discharge: Continued Medical Work up  Expected Discharge Plan and Services   Discharge Planning Services: CM Consult Post Acute Care Choice: Resumption of Svcs/PTA Provider Living arrangements for the past 2 months: Single Family Home                                       Social Determinants of Health (SDOH) Interventions SDOH Screenings   Food Insecurity: No Food Insecurity (04/01/2023)  Housing: Low Risk  (04/01/2023)  Transportation Needs: No Transportation Needs (04/01/2023)  Utilities: Not At Risk (04/01/2023)  Depression (PHQ2-9): Low Risk  (12/27/2022)  Tobacco Use: Medium Risk (04/01/2023)    Readmission Risk Interventions     No data to display

## 2023-04-06 NOTE — TOC Progression Note (Signed)
Transition of Care [facility]) - Progression Note    Patient Details  Name: Gina Cervantes MRN: [id number] Date of Birth: 12-Nov-1960  Transition of Care [facility]) CM/SW Contact  Adrian Prows, RN Phone Number: 04/06/2023, 11:04 AM  Clinical Narrative:    Orders received for home oxygen; pt being transported for procedure; unable to discuss choice; will pass on to oncoming TOC.   Expected Discharge Plan: Home/Self Care Barriers to Discharge: Continued Medical Work up  Expected Discharge Plan and Services   Discharge Planning Services: CM Consult Post Acute Care Choice: Resumption of Svcs/PTA Provider Living arrangements for the past 2 months: Single Family Home                                       Social Determinants of Health (SDOH) Interventions SDOH Screenings   Food Insecurity: No Food Insecurity (04/01/2023)  Housing: Low Risk  (04/01/2023)  Transportation Needs: No Transportation Needs (04/01/2023)  Utilities: Not At Risk (04/01/2023)  Depression (PHQ2-9): Low Risk  (12/27/2022)  Tobacco Use: Medium Risk (04/01/2023)    Readmission Risk Interventions     No data to display

## 2023-04-06 NOTE — Progress Notes (Signed)
[facility] Progress Note Patient: Gina Cervantes EAV:[id number] DOB: 08-05-1960 DOA: 04/01/2023  DOS: the patient was seen and examined on 04/06/2023  Brief hospital course: PMH of anal cancer, breast cancer, lung cancer, treated with radiation and chemotherapy present to the hospital with complaints of cough and shortness of breath. Echocardiogram shows stable EF at 45% but shows evidence of worsening right sided heart failure. Oncology following. Cardiology consulted.  Underwent right and left heart catheter found to have group 3 pulmonary hypertension.  Monitor overnight.  Assessment and Plan: Acute on chronic biventricular failure. Cardiology consulted. Received IV Lasix. No chest pain. No concerns for ACS. Monitor for now. Cardiology consulted. Underwent right and left heart cath.  No obstructive disease.  Right heart catheter confirms group 3 pulmonary hypertension.  Outpatient follow-up with pulmonary recommended.  Initiating GDMT.  Multiple malignancies. Outpatient oncology follow-up. Currently do not think that lymphangitic spread is happening. Possible fibrosis cannot be ruled out.  Possible pulmonary fibrosis from radiation. Pulmonary hypertension Acute hypoxia. Saturation dropping to 78% on room air on ambulation. Will require oxygen on discharge. Discussed with pulmonary, outpatient follow-up recommended.  Concern for COPD. Currently does not appear to have any exacerbation. For now we will monitor.  Anemia. H&H stable.  Monitor.  Anxiety. Monitor  Hyponatremia. Likely from excessive water intake. Will initiate free water restriction and monitor.   Subjective: No nausea no vomiting no fever no chills.  Breathing improving.  Physical Exam: General: in Mild distress, No Rash Cardiovascular: S1 and S2 Present, No Murmur Respiratory: Good respiratory effort, Bilateral Air entry present.  Right basal crackles, No wheezes Abdomen: Bowel Sound  present, No tenderness Extremities: Improving edema Neuro: Alert and oriented x3, no new focal deficit  Data Reviewed: I have Reviewed nursing notes, Vitals, and Lab results. Since last encounter, pertinent lab results CBC and CMP   . I have ordered test including CBC and BMP  . I have discussed pt's care plan and test results with cardiology  .   Disposition: Status is: Inpatient Remains inpatient appropriate because: Monitor for improvement in oxygenation  enoxaparin (LOVENOX) injection 40 mg Start: 04/01/23 2200   Family Communication: No one at bedside Level of care: Telemetry   Vitals:   04/06/23 1333 04/06/23 1345 04/06/23 1450 04/06/23 1657  BP: (!) 78/55 93/60 (!) 87/58 (!) 101/59  Pulse: 85 79 78 75  Resp: (!) 21 16 20    Temp:   98.1 F (36.7 C)   TempSrc:   Oral Oral  SpO2: 96% 95% 98% 98%  Weight:      Height:         Author: Lynden Oxford, MD 04/06/2023 6:53 PM  Please look on [email or web address] to find out who is on call.

## 2023-04-06 NOTE — Plan of Care (Signed)
  Problem: Clinical Measurements: Goal: Respiratory complications will improve Outcome: Progressing Goal: Cardiovascular complication will be avoided Outcome: Progressing   Problem: Coping: Goal: Level of anxiety will decrease Outcome: Progressing   

## 2023-04-06 NOTE — Interval H&P Note (Signed)
History and Physical Interval Note:  04/06/2023 11:17 AM  Gina Cervantes  has presented today for surgery, with the diagnosis of acute cor pulmonale and biventricular systolic heart failure.  The various methods of treatment have been discussed with the patient and family. After consideration of risks, benefits and other options for treatment, the patient has consented to  Procedure(s): RIGHT/LEFT HEART CATH AND CORONARY ANGIOGRAPHY (N/A) and possible coronary angioplasty as a surgical intervention.  The patient's history has been reviewed, patient examined, no change in status, stable for surgery.  I have reviewed the patient's chart and labs.  Questions were answered to the patient's satisfaction.     Yates Decamp

## 2023-04-06 NOTE — Progress Notes (Signed)
Pt. took Breztri at 0900. This is her home medication.

## 2023-04-07 DIAGNOSIS — J9601 Acute respiratory failure with hypoxia: Secondary | ICD-10-CM | POA: Diagnosis not present

## 2023-04-07 LAB — RENAL FUNCTION PANEL
Albumin: 3.2 g/dL — ABNORMAL LOW (ref 3.5–5.0)
Anion gap: 8 (ref 5–15)
BUN: 36 mg/dL — ABNORMAL HIGH (ref 6–20)
CO2: 38 mmol/L — ABNORMAL HIGH (ref 22–32)
Calcium: 8.9 mg/dL (ref 8.9–10.3)
Chloride: 86 mmol/L — ABNORMAL LOW (ref 98–111)
Creatinine, Ser: 1.11 mg/dL — ABNORMAL HIGH (ref 0.44–1.00)
GFR, Estimated: 57 mL/min — ABNORMAL LOW (ref >60)
Glucose, Bld: 96 mg/dL (ref 70–99)
Phosphorus: 4.1 mg/dL (ref 2.5–4.6)
Potassium: 5.1 mmol/L (ref 3.5–5.1)
Sodium: 132 mmol/L — ABNORMAL LOW (ref 135–145)

## 2023-04-07 LAB — MAGNESIUM: Magnesium: 2.7 mg/dL — ABNORMAL HIGH (ref 1.7–2.4)

## 2023-04-07 MED ORDER — DAPAGLIFLOZIN PROPANEDIOL 10 MG PO TABS: 10.0000 mg | ORAL_TABLET | Freq: Every day | ORAL | 0 refills | Status: DC

## 2023-04-07 MED ORDER — CARVEDILOL 3.125 MG PO TABS
3.1250 mg | ORAL_TABLET | Freq: Two times a day (BID) | ORAL | 0 refills | Status: DC
Start: 2023-04-07 — End: 2023-04-19

## 2023-04-07 MED ORDER — FUROSEMIDE 20 MG PO TABS: 20.0000 mg | ORAL_TABLET | Freq: Every day | ORAL | 0 refills | Status: DC | PRN

## 2023-04-07 NOTE — Progress Notes (Signed)
Rounding Note    Patient Name: Gina Cervantes Date of Encounter: 04/07/2023  [facility] Cardiologist: Adrian Prince  Subjective   No complaints  Inpatient Medications    Scheduled Meds:  Budeson-Glycopyrrol-Formoterol  2 puff Inhalation BID   carvedilol  3.125 mg Oral BID WC   dapagliflozin propanediol  10 mg Oral Daily   enoxaparin (LOVENOX) injection  40 mg Subcutaneous Q24H   sodium chloride flush  3 mL Intravenous Q12H   sodium chloride flush  3 mL Intravenous Q12H   Continuous Infusions:  PRN Meds: acetaminophen **OR** acetaminophen, albuterol, HYDROcodone-acetaminophen, ondansetron **OR** ondansetron (ZOFRAN) IV, mouth rinse, sodium chloride flush   Vital Signs    Vitals:   04/06/23 1657 04/06/23 2022 04/07/23 0500 04/07/23 0516  BP: (!) 101/59 (!) 93/54  (!) 106/56  Pulse: 75 76  79  Resp:  17  18  Temp:  98.2 F (36.8 C)  99 F (37.2 C)  TempSrc: Oral Oral  Oral  SpO2: 98% 96%  94%  Weight:   63.9 kg   Height:        Intake/Output Summary (Last 24 hours) at 04/07/2023 0719 Last data filed at 04/07/2023 0600 Gross per 24 hour  Intake 480 ml  Output --  Net 480 ml      04/07/2023    5:00 AM 04/06/2023    5:00 AM 04/05/2023    4:26 AM  Last 3 Weights  Weight (lbs) 140 lb 14 oz 139 lb 144 lb  Weight (kg) 63.9 kg 63.05 kg 65.318 kg      Telemetry    NSR - Personally Reviewed  ECG    N/a - Personally Reviewed  Physical Exam   GEN: No acute distress.   Neck: No JVD Cardiac: RRR, no murmurs, rubs, or gallops.  Respiratory: Clear to auscultation bilaterally. GI: Soft, nontender, non-distended  MS: No edema; No deformity. Neuro:  Nonfocal  Psych: Normal affect  Right radial cath site looks good, no significant bruising, normal palpable radial pulse  Labs    High Sensitivity Troponin:   Recent Labs  Lab 04/01/23 1141 04/01/23 1345 04/01/23 2120 04/02/23 0057  TROPONINIHS 29* 27* 23* 22*     Chemistry Recent  Labs  Lab 04/01/23 1141 04/01/23 2120 04/02/23 0057 04/03/23 0506 04/05/23 0529 04/05/23 0824 04/05/23 2044 04/06/23 0522 04/06/23 1150 04/06/23 1153 04/06/23 1154 04/07/23 0511  NA 133*  --  137   < > 125*   < > 127* 127*   < > 129* 129* 132*  K 4.6  --  4.1   < > 4.0   < > 5.0 4.4   < > 4.5 4.5 5.1  CL 92*  --  95*   < > 81*   < > 83* 83*  --   --   --  86*  CO2 30  --  32   < > 35*   < > 32 34*  --   --   --  38*  GLUCOSE 97  --  130*   < > 98   < > 107* 98  --   --   --  96  BUN 18  --  18   < > 26*   < > 37* 36*  --   --   --  36*  CREATININE 0.71  --  0.75   < > 0.85   < > 1.37* 1.19*  --   --   --  1.11*  CALCIUM 9.0  --  8.5*   < > 8.6*   < > 9.0 8.8*  --   --   --  8.9  MG  --    < > 1.6*  --  2.0  --   --  2.5*  --   --   --   --   PROT 7.4  --  6.7  --   --   --   --   --   --   --   --   --   ALBUMIN 4.1  --  3.3*  --   --   --   --  3.3*  --   --   --  3.2*  AST 29  --  25  --   --   --   --   --   --   --   --   --   ALT 29  --  26  --   --   --   --   --   --   --   --   --   ALKPHOS 65  --  57  --   --   --   --   --   --   --   --   --   BILITOT 0.8  --  0.8  --   --   --   --   --   --   --   --   --   GFRNONAA >60  --  >60   < > >60   < > 44* 52*  --   --   --  57*  ANIONGAP 11  --  10   < > 9   < > 12 10  --   --   --  8   < > = values in this interval not displayed.    Lipids No results for input(s): "CHOL", "TRIG", "HDL", "LABVLDL", "LDLCALC", "CHOLHDL" in the last 168 hours.  Hematology Recent Labs  Lab 04/02/23 0057 04/05/23 0529 04/06/23 0522 04/06/23 1150 04/06/23 1153 04/06/23 1154  WBC 4.2 5.0 5.4  --   --   --   RBC 4.02 4.48 4.68  --   --   --   HGB 15.0 16.5* 17.1* 17.7* 18.0* 18.0*  HCT 44.2 48.3* 51.1* 52.0* 53.0* 53.0*  MCV 110.0* 107.8* 109.2*  --   --   --   MCH 37.3* 36.8* 36.5*  --   --   --   MCHC 33.9 34.2 33.5  --   --   --   RDW 18.4* 16.4* 16.2*  --   --   --   PLT 210 191 223  --   --   --    Thyroid No results for  input(s): "TSH", "FREET4" in the last 168 hours.  BNP Recent Labs  Lab 04/01/23 1141 04/03/23 0506  BNP 737.4* 222.4*    DDimer No results for input(s): "DDIMER" in the last 168 hours.   Radiology    CARDIAC CATHETERIZATION  Result Date: 04/06/2023 Images from the original result were not included. Right & Left Heart Catheterization 04/06/23: Hemodynamic data: RA 10/7, mean 6 mmHg. RV 38/0, EDP 7 mmHg. PA 39/20, mean 28 mmHg. PW 10/9, EDP 9 mmHg. QP/QS 1.0.  PA saturation 68%, aortic saturation 95%. CO 3.64, CI 2.12 by Fick. PVR 5.22. PAPi 3.1 (normal). LV 97/-6, EDP 4 mmHg.  Ao 103/66, mean 82 mmHg.  No pressure gradient across the aortic valve. Angiographic data: LM: Large vessel.  Smooth and normal. LAD: Very large vessel.  Gives origin to large D1.  Proximal LAD has a mild coronary calcification without luminal irregularity. LCx: Large-caliber vessel.  Smooth and normal. RI: Moderate caliber vessel, smooth and normal. RCA: Large callosal giving origin to large PDA and PL Genene Kilman, it smooth and normal. Impression and recommendations: Mild coronary calcification involving the proximal LAD.  Otherwise normal coronary arteries. Mild pulmonary hypertension with low LVEDP and pulmonary capillary wedge suggests primary pulmonary hypertension (WHO group 3).  PAPi is preserved suggesting relatively preserved RV systolic function reserve. Will start the patient on Farxiga 5 mg daily for heart failure management.  Continue Entresto and Coreg at low-dose in view of soft blood pressure.  She will need referral to pulmonary medicine to evaluate for pulmonary fibrosis and pulmonary hypertension and consideration for starting her on Tyvaso for pulmonary hypertension as a disease modifying agent for WHO group 3 PAH. Start low-dose statins in view of coronary calcification.    Cardiac Studies     Patient Profile     Gina Cervantes is a 62 y.o. female has hx of   moderately differentiated squamous cell  anal cancer SP chemo therapy completed 10/02/2038 SP radiation therapy same time, right breast cancer completed chemotherapy in past now on Arimidex, SP radiation therapy as well to her chest, pulmonary fibrosis related to radiation therapy.  Initially admitted on 03/28/2023 due to worsening complaints of shortness of breath and leg edema.  Cardiology asked to see due to new biventricular heart failure.  Assessment & Plan    1.Acute HFrEF - 12/2020 echo: LVEF 45-50%, grade I dd, low normal RV function, PASP 26 - 03/2023 echo: LVEF 45%, grade I dd, D shaped septum, mod RV dysfunction, PASP 41 03/2023 RHC/LHC: no significant CAD Mean PA 28, PCWP 29, CI 2.12  - Diuresed this admission, reported dry weight is 139 lbs where she is today. PCWP by cath yesterday 9 mmHg.  - medical has been limited by soft bp's. She is on coreg 3.125mg  bid, farxiga 10mg  daily. BP's remain soft, no additional agents at this time. - trial of entresto 24/26mg  yesterday, after dose SBP to s 70s.  - would start lasix 20mg  prn swelling of weight gain of 3 lbs at discharge.    2. Pulmonary HTN - 03/2023 echo: LVEF 45%, grade I dd, D shaped septum, mod RV dysfunction, PASP 41 - mild pulmonary HTN, mean PA 28 and PCWP 9. Likely group III pulmonary HTN given likely pulmonary fibrosis from radiation in the past. Would f/u with pulmonary as outpatient. - CT PE negative here  No additional cardiology recs, we will sign off inpatient care. She has f/u 12/12 already arranged     For questions or updates, please contact [facility] Please consult [email or web address] for contact info under        Signed, Dina Rich, MD  04/07/2023, 7:19 AM

## 2023-04-07 NOTE — Discharge Planning (Signed)
Discharge teaching complete. Pt voices understanding. Discharged home in self care.

## 2023-04-07 NOTE — Plan of Care (Signed)
  Problem: Clinical Measurements: Goal: Cardiovascular complication will be avoided Outcome: Progressing   Problem: Nutrition: Goal: Adequate nutrition will be maintained Outcome: Progressing   Problem: Coping: Goal: Level of anxiety will decrease Outcome: Progressing

## 2023-04-07 NOTE — TOC Transition Note (Signed)
Transition of Care [facility]) - CM/SW Discharge Note   Patient Details  Name: Gina Cervantes MRN: [id number] Date of Birth: 1960/09/11  Transition of Care [facility]) CM/SW Contact:  Adrian Prows, RN Phone Number: 04/07/2023, 12:02 PM   Clinical Narrative:    D/C orders received; travel oxygen has been delivered to room; no TOC needs.   Final next level of care: Home/Self Care Barriers to Discharge: No Barriers Identified   Patient Goals and CMS Choice CMS Medicare.gov Compare Post Acute Care list provided to:: Patient Choice offered to / list presented to : Patient  Discharge Placement                         Discharge Plan and Services Additional resources added to the After Visit Summary for     Discharge Planning Services: CM Consult Post Acute Care Choice: Resumption of Svcs/PTA Provider                               Social Determinants of Health (SDOH) Interventions SDOH Screenings   Food Insecurity: No Food Insecurity (04/01/2023)  Housing: Low Risk  (04/01/2023)  Transportation Needs: No Transportation Needs (04/01/2023)  Utilities: Not At Risk (04/01/2023)  Depression (PHQ2-9): Low Risk  (12/27/2022)  Tobacco Use: Medium Risk (04/01/2023)     Readmission Risk Interventions     No data to display

## 2023-04-09 ENCOUNTER — Encounter (HOSPITAL_COMMUNITY): Payer: Self-pay | Admitting: Cardiology

## 2023-04-10 ENCOUNTER — Encounter: Payer: Self-pay | Admitting: Oncology

## 2023-04-10 ENCOUNTER — Other Ambulatory Visit: Payer: Self-pay | Admitting: Obstetrics & Gynecology

## 2023-04-10 DIAGNOSIS — Z1231 Encounter for screening mammogram for malignant neoplasm of breast: Secondary | ICD-10-CM

## 2023-04-10 NOTE — Discharge Summary (Signed)
Physician Discharge Summary   Patient: Gina Cervantes MRN: [id number] DOB: 1961/04/14  Admit date:     04/01/2023  Discharge date: 04/07/2023  Discharge Physician: Lynden Oxford  PCP: Noberto Retort, MD  Recommendations at discharge:  Follow up with PCP and Cardiology as recommended    Follow-up Information     Dunn, Dayna N, PA-C Follow up.   Specialties: Cardiology, Radiology Why: Thursday Apr 19, 2023 Arrive by 9:50 AMAppt at 10:05 AM (25 min) Contact information: [address] [place] [address] [phone]         [facility] Follow up.   Contact information: [facility]) [address] [place], [place] [address] [phone]        Noberto Retort, MD. Schedule an appointment as soon as possible for a visit in 1 week(s).   Specialty: Family Medicine Why: with BMP lab to look at kidney and electrolytes Contact information: [address] [place] [address] [phone]                Discharge Diagnoses: Principal Problem:   Acute hypoxemic respiratory failure (HCC) Active Problems:   Breast cancer, right (HCC)   Anal cancer (HCC)   Lung cancer, left  upper lobe (HCC)   COPD with asthma (HCC)   Elevated troponin   Acute on chronic combined systolic and diastolic CHF (congestive heart failure) (HCC)   Hypercarbia  Hospital Course: Brief hospital course: PMH of anal cancer, breast cancer, lung cancer, treated with radiation and chemotherapy present to the hospital with complaints of cough and shortness of breath. Echocardiogram shows stable EF at 45% but shows evidence of worsening right sided heart failure. Oncology following. Cardiology consulted.  Underwent right and left heart catheter found to have group 3 pulmonary hypertension.  Assessment and Plan: Acute on chronic biventricular failure. Cardiology consulted. Received IV Lasix. No chest pain. No concerns for ACS. Cardiology  consulted. Underwent right and left heart cath.  No obstructive disease.  Right heart Cardiac Catheterization confirms group 3 pulmonary hypertension.  Outpatient follow-up with pulmonary recommended.  Initiating GDMT.  Multiple malignancies. Outpatient oncology follow-up. Currently do not think that lymphangitic spread is happening. Possible fibrosis cannot be ruled out.  Possible pulmonary fibrosis from radiation. Pulmonary hypertension Acute hypoxia. Saturation dropping to 78% on room air on ambulation. Will require oxygen on discharge. Discussed with pulmonary, outpatient follow-up recommended.  Concern for COPD. Currently does not appear to have any exacerbation. For now we will monitor.  Anemia. H&H stable.  Monitor.  Anxiety. Monitor  Hyponatremia. Likely from excessive water intake. Improve with free water restriction.  Consultants:  Cardiology  Oncology   Procedures performed:  Echocardiogram  Right and left Cardiac Catheterization   DISCHARGE MEDICATION: Allergies as of 04/07/2023   No Known Allergies      Medication List     TAKE these medications    albuterol 108 (90 Base) MCG/ACT inhaler Commonly known as: VENTOLIN HFA INHALE 2 PUFFS BY MOUTH EVERY 6 HOURS AS NEEDED FOR WHEEZING OR SHORTNESS OF BREATH What changed: See the new instructions.   Breztri Aerosphere 160-9-4.8 MCG/ACT Aero Generic drug: Budeson-Glycopyrrol-Formoterol Inhale 2 puffs into the lungs in the morning and at bedtime.   CALCIUM PO Take 1 tablet by mouth daily. Dosage unknown   carvedilol 3.125 MG tablet Commonly known as: COREG Take 1 tablet (3.125 mg total) by mouth 2 (two) times daily with a meal. Notes to patient: Given 04/07/23 @  900am   dapagliflozin propanediol 10 MG Tabs tablet Commonly known as: FARXIGA Take 1 tablet (10 mg total) by mouth daily. Notes to patient: Given 04/07/23 @ 900am   furosemide 20 MG tablet Commonly known as: Lasix Take 1 tablet (20  mg total) by mouth daily as needed (for weight gain of 3 lbs in 1 day or 5 lbs in 1 week).   LORazepam 0.5 MG tablet Commonly known as: ATIVAN Take 1 tablet (0.5 mg total) by mouth every 8 (eight) hours as needed for anxiety (or nausea).       Disposition: Home Diet recommendation: Cardiac diet  Discharge Exam: Vitals:   04/07/23 0500 04/07/23 0516 04/07/23 0900 04/07/23 1011  BP:  (!) 106/56 92/67 113/65  Pulse:  79 95 77  Resp:  18  19  Temp:  99 F (37.2 C) 98.6 F (37 C) 98.3 F (36.8 C)  TempSrc:  Oral Oral Oral  SpO2:  94% 94% 93%  Weight: 63.9 kg     Height:       General: Appear in mild distress; no visible Abnormal Neck Mass Or lumps, Conjunctiva normal Cardiovascular: S1 and S2 Present, no Murmur, Respiratory: good respiratory effort, Bilateral Air entry present and right basal Crackles, no wheezes Abdomen: Bowel Sound present, Non tender  Extremities: no Pedal edema Neurology: alert and oriented to time, place, and person  Filed Weights   04/05/23 0426 04/06/23 0500 04/07/23 0500  Weight: 65.3 kg 63 kg 63.9 kg   Condition at discharge: stable  The results of significant diagnostics from this hospitalization (including imaging, microbiology, ancillary and laboratory) are listed below for reference.   Imaging Studies: CARDIAC CATHETERIZATION  Result Date: 04/06/2023 Images from the original result were not included. Right & Left Heart Catheterization 04/06/23: Hemodynamic data: RA 10/7, mean 6 mmHg. RV 38/0, EDP 7 mmHg. PA 39/20, mean 28 mmHg. PW 10/9, EDP 9 mmHg. QP/QS 1.0.  PA saturation 68%, aortic saturation 95%. CO 3.64, CI 2.12 by Fick. PVR 5.22. PAPi 3.1 (normal). LV 97/-6, EDP 4 mmHg.  Ao 103/66, mean 82 mmHg.  No pressure gradient across the aortic valve. Angiographic data: LM: Large vessel.  Smooth and normal. LAD: Very large vessel.  Gives origin to large D1.  Proximal LAD has a mild coronary calcification without luminal irregularity. LCx:  Large-caliber vessel.  Smooth and normal. RI: Moderate caliber vessel, smooth and normal. RCA: Large callosal giving origin to large PDA and PL branch, it smooth and normal. Impression and recommendations: Mild coronary calcification involving the proximal LAD.  Otherwise normal coronary arteries. Mild pulmonary hypertension with low LVEDP and pulmonary capillary wedge suggests primary pulmonary hypertension (WHO group 3).  PAPi is preserved suggesting relatively preserved RV systolic function reserve. Will start the patient on Farxiga 5 mg daily for heart failure management.  Continue Entresto and Coreg at low-dose in view of soft blood pressure.  She will need referral to pulmonary medicine to evaluate for pulmonary fibrosis and pulmonary hypertension and consideration for starting her on Tyvaso for pulmonary hypertension as a disease modifying agent for WHO group 3 PAH. Start low-dose statins in view of coronary calcification.   ECHOCARDIOGRAM COMPLETE  Result Date: 04/02/2023    ECHOCARDIOGRAM REPORT   Patient Name:   ELLANORE BOWERS Boldon Date of Exam: 04/02/2023 Medical Rec #:  [id number]         Height:       65.0 in Accession #:    [id number]  Weight:       150.8 lb Date of Birth:  [birth year]          BSA:          1.754 m Patient Age:    62 years          BP:           111/74 mmHg Patient Gender: F                 HR:           96 bpm. Exam Location:  Inpatient Procedure: 2D Echo, 3D Echo, Cardiac Doppler and Color Doppler Indications:     R07.9* Chest pain, unspecified.  History:         Patient has prior history of Echocardiogram examinations, most                  recent 12/24/2020. CHF, COPD; Signs/Symptoms:Dyspnea and                  Shortness of Breath. Breast cancer. Lung cancer.  Sonographer:     Sheralyn Boatman RDCS Referring Phys:  Kenn File DOUTOVA Diagnosing Phys: Freida Busman McleanMD IMPRESSIONS  1. Left ventricular ejection fraction, by estimation, is 45%. The left ventricle has mildly  decreased function. The left ventricle demonstrates global hypokinesis. Left ventricular diastolic parameters are consistent with Grade I diastolic dysfunction (impaired relaxation).  2. D-shaped septum suggestive of RV pressure/volume overload. Right ventricular systolic function is moderately reduced. The right ventricular size is moderately enlarged. There is mildly elevated pulmonary artery systolic pressure. The estimated right ventricular systolic pressure is 40.9 mmHg.  3. Right atrial size was mildly dilated.  4. The mitral valve is normal in structure. No evidence of mitral valve regurgitation. No evidence of mitral stenosis.  5. The aortic valve is tricuspid. There is mild calcification of the aortic valve. Aortic valve regurgitation is not visualized. No aortic stenosis is present.  6. The inferior vena cava is normal in size with <50% respiratory variability, suggesting right atrial pressure of 8 mmHg. FINDINGS  Left Ventricle: Left ventricular ejection fraction, by estimation, is 45%. The left ventricle has mildly decreased function. The left ventricle demonstrates global hypokinesis. The left ventricular internal cavity size was normal in size. There is no left ventricular hypertrophy. Left ventricular diastolic parameters are consistent with Grade I diastolic dysfunction (impaired relaxation). Right Ventricle: D-shaped septum suggestive of RV pressure/volume overload. The right ventricular size is moderately enlarged. No increase in right ventricular wall thickness. Right ventricular systolic function is moderately reduced. There is mildly elevated pulmonary artery systolic pressure. The tricuspid regurgitant velocity is 2.87 m/s, and with an assumed right atrial pressure of 8 mmHg, the estimated right ventricular systolic pressure is 40.9 mmHg. Left Atrium: Left atrial size was normal in size. Right Atrium: Right atrial size was mildly dilated. Pericardium: There is no evidence of pericardial  effusion. Mitral Valve: The mitral valve is normal in structure. There is mild calcification of the mitral valve leaflet(s). Mild mitral annular calcification. No evidence of mitral valve regurgitation. No evidence of mitral valve stenosis. Tricuspid Valve: The tricuspid valve is normal in structure. Tricuspid valve regurgitation is trivial. Aortic Valve: The aortic valve is tricuspid. There is mild calcification of the aortic valve. Aortic valve regurgitation is not visualized. No aortic stenosis is present. Pulmonic Valve: The pulmonic valve was normal in structure. Pulmonic valve regurgitation is not visualized. Aorta: The aortic root is normal in size and structure. Venous:  The inferior vena cava is normal in size with less than 50% respiratory variability, suggesting right atrial pressure of 8 mmHg. IAS/Shunts: No atrial level shunt detected by color flow Doppler.  LEFT VENTRICLE PLAX 2D LVIDd:         4.60 cm     Diastology LVIDs:         3.60 cm     LV e' medial:    7.51 cm/s LV PW:         0.90 cm     LV E/e' medial:  12.7 LV IVS:        0.70 cm     LV e' lateral:   8.81 cm/s LVOT diam:     1.90 cm     LV E/e' lateral: 10.9 LV SV:         51 LV SV Index:   29 LVOT Area:     2.84 cm                             3D Volume EF: LV Volumes (MOD)           3D EF:        45 % LV vol d, MOD A2C: 74.6 ml LV EDV:       170 ml LV vol d, MOD A4C: 72.1 ml LV ESV:       93 ml LV vol s, MOD A2C: 39.6 ml LV SV:        77 ml LV vol s, MOD A4C: 36.0 ml LV SV MOD A2C:     35.0 ml LV SV MOD A4C:     72.1 ml LV SV MOD BP:      33.4 ml RIGHT VENTRICLE             IVC RV S prime:     10.00 cm/s  IVC diam: 2.00 cm TAPSE (M-mode): 1.4 cm LEFT ATRIUM             Index        RIGHT ATRIUM           Index LA diam:        3.10 cm 1.77 cm/m   RA Area:     18.70 cm LA Vol (A2C):   26.9 ml 15.33 ml/m  RA Volume:   52.80 ml  30.10 ml/m LA Vol (A4C):   26.5 ml 15.10 ml/m LA Biplane Vol: 27.6 ml 15.73 ml/m  AORTIC VALVE LVOT Vmax:    119.00 cm/s LVOT Vmean:  73.800 cm/s LVOT VTI:    0.181 m  AORTA Ao Root diam: 3.40 cm Ao Asc diam:  3.10 cm MITRAL VALVE               TRICUSPID VALVE MV Area (PHT): 4.63 cm    TR Peak grad:   32.9 mmHg MV Decel Time: 164 msec    TR Vmax:        287.00 cm/s MV E velocity: 95.70 cm/s MV A velocity: 94.73 cm/s  SHUNTS MV E/A ratio:  1.01        Systemic VTI:  0.18 m                            Systemic Diam: 1.90 cm Dalton McleanMD Electronically signed by Wilfred Lacy Signature Date/Time: 04/02/2023/2:24:40 PM    Final (Updated)    CT Angio Chest PE  W and/or Wo Contrast  Result Date: 04/01/2023 CLINICAL DATA:  Pulmonary embolism (PE) suspected, high prob shortness of breath. EXAM: CT ANGIOGRAPHY CHEST WITH CONTRAST TECHNIQUE: Multidetector CT imaging of the chest was performed using the standard protocol during bolus administration of intravenous contrast. Multiplanar CT image reconstructions and MIPs were obtained to evaluate the vascular anatomy. RADIATION DOSE REDUCTION: This exam was performed according to the departmental dose-optimization program which includes automated exposure control, adjustment of the mA and/or kV according to patient size and/or use of iterative reconstruction technique. CONTRAST:  75mL OMNIPAQUE IOHEXOL 350 MG/ML SOLN COMPARISON:  PET-CT scan from 03/12/2023 and CT scan chest from 01/01/2023. FINDINGS: Cardiovascular: No evidence of embolism to the proximal subsegmental pulmonary artery level. Normal cardiac size. No pericardial effusion. No aortic aneurysm. There are coronary artery calcifications, in keeping with coronary artery disease. There are also mild peripheral atherosclerotic vascular calcifications of thoracic aorta and its major branches. There is dilation of the main pulmonary trunk measuring up to 3.7 cm, which is nonspecific but can be seen with pulmonary artery hypertension. Mediastinum/Nodes: Visualized thyroid gland appears grossly unremarkable. No solid /  cystic mediastinal masses. The esophagus is nondistended precluding optimal assessment. There are few mildly prominent mediastinal and hilar lymph nodes, which appear grossly similar to the prior study. No axillary lymphadenopathy by size criteria. Lungs/Pleura: The central tracheo-bronchial tree is patent. Minimal biapical paraseptal emphysematous changes noted. Redemonstration of multiple masslike opacities in the right lung predominantly in the perihilar region. These opacities are essentially similar to the prior PET-CT scan. There is also subpleural opacity along the anterior right upper lobe and middle lobe. There are also multiple part solid 1-2 cm sized nodules in the left lung, which are also similar to the prior study. There is trace right pleural effusion. No left pleural effusion. No new lung mass, consolidation or lung collapse. Upper Abdomen: Visualized upper abdominal viscera within normal limits. Musculoskeletal: The visualized soft tissues of the chest wall are grossly unremarkable. No suspicious osseous lesions. There are mild multilevel degenerative changes in the visualized spine. Review of the MIP images confirms the above findings. IMPRESSION: 1. No embolism to the proximal subsegmental pulmonary artery level. 2. No new lung mass, consolidation or pneumothorax. There is trace right pleural effusion. No left pleural effusion. 3. Redemonstration of multiple masslike opacities throughout bilateral lungs as well as multiple ground-glass opacities in the left lung, which are essentially similar to the prior PET-CT scan from 03/12/2023. Please refer to prior CT scan report for additional details. 4. Multiple other nonacute observations, as described above. Aortic Atherosclerosis (ICD10-I70.0) and Emphysema (ICD10-J43.9). Electronically Signed   By: Jules Schick M.D.   On: 04/01/2023 14:39   DG Chest Portable 1 View  Result Date: 04/01/2023 CLINICAL DATA:  Shortness of breath. EXAM: PORTABLE  CHEST 1 VIEW COMPARISON:  PET-CT 03/12/2023.  Chest x-ray 08/08/2021 FINDINGS: Irregular opacities in the right upper and mid lung similar to abnormality seen on recent PET imaging. Tiny right pleural effusion. Postsurgical scarring noted left upper lobe. The cardio pericardial silhouette is enlarged. No acute bony abnormality. Telemetry leads overlie the chest. IMPRESSION: 1. Irregular opacities in the right upper and mid lung similar to abnormality seen on recent PET imaging. The opacity in the right mid lung is progressive since previous chest x-ray. 2. Tiny right pleural effusion. Electronically Signed   By: Kennith Center M.D.   On: 04/01/2023 12:31   NM PET Image Restag (PS) Skull Base To Thigh  Result Date:  03/12/2023 CLINICAL DATA:  Subsequent treatment strategy for non-small cell lung cancer. EXAM: NUCLEAR MEDICINE PET SKULL BASE TO THIGH TECHNIQUE: 7.65 mCi F-18 FDG was injected intravenously. Full-ring PET imaging was performed from the skull base to thigh after the radiotracer. CT data was obtained and used for attenuation correction and anatomic localization. Fasting blood glucose: 124 mg/dl COMPARISON:  CT chest 16/02/9603 and PET-CT from 03/02/2022 FINDINGS: Mediastinal blood pool activity: SUV max 2.55 Liver activity: SUV max NA NECK: No hypermetabolic lymph nodes in the neck. Incidental CT findings: None. CHEST: 1.2 cm right paratracheal lymph node has an SUV max of 3.74, image 65/. On previous PET-CT from 03/02/2022 this measured 1 cm with SUV max of 3.74. Subcarinal lymph node measures 1 cm with SUV max of 3.65, image 75/4. previously 1.2 cm with SUV max of 3.3. The treated lung tumor within the right upper lobe measures 3.0 x 2.1 cm with SUV max of 3.07, image 18/7. Previously 2.8 x 2.1 cm with SUV max 3.67. Subpleural masslike architectural distortion and fibrosis overlying the periphery of the right mid lung compatible with progressive changes of external beam radiation corresponding SUV max  is equal to 4.32, image 32/7. This obscures the underlying treated lung nodule within the periphery of the right upper lobe. Post treatment change with masslike architectural distortion within the right middle and right lower lobe is again noted and measures 5.0 x 3.6 cm and has an SUV max of 3.41, image 39/7. On the previous exam this measured 4.3 by 3.6 cm with SUV max of 2.93. There are postsurgical changes within the left upper lobe. Several part solid nodules in the left lung are again noted. -Index left upper lobe part solid nodule measures 1.7 cm with SUV max of 1.69, image 30/7. On the previous PET-CT this measured 1.1 cm with SUV max 1.67. -Sub-solid nodule within the perihilar aspect of the posterior left upper lobe measures 2.2 cm with SUV max of 1.5, image 31/7. Previously 1.8 cm with SUV max of 1.8. Non-solid nodule within the left lower lobe measures 1.5 cm with SUV max of 0.76, image 40/7. Unchanged compared with the previous exam. Non-solid nodule within the posterior left lower lobe measures 1.1 cm without significant tracer uptake, image 48/7. Unchanged from previous exam. Incidental CT findings: None. ABDOMEN/PELVIS: No abnormal hypermetabolic activity within the liver, pancreas, adrenal glands, or spleen. No hypermetabolic lymph nodes in the abdomen or pelvis. Incidental CT findings: Aortic atherosclerotic calcifications. Nonobstructing stones noted within the inferior pole of left kidney. SKELETON: No focal hypermetabolic activity to suggest skeletal metastasis. Incidental CT findings: None. IMPRESSION: 1. Multifocal areas of masslike architectural distortion in surrounding fibrosis are identified within the right lung. There is mild tracer uptake associated with these areas which are favored to represent changes secondary to external beam radiation. No highly specific findings identified to suggest locally recurrent tracer avid tumor in the right lung. 2. No signs of tracer avid distant  metastatic disease. 3. Stable appearance of prominent mediastinal lymph nodes with low level FDG uptake. 4. Multiple part solid nodules in the left lung are again noted and are stable in size and FDG avidity. Multifocal pulmonary adenocarcinoma remains a distinct concern. 5. Nonobstructing left renal stones. 6.  Aortic Atherosclerosis (ICD10-I70.0). Electronically Signed   By: Signa Kell M.D.   On: 03/12/2023 14:29    Microbiology: Results for orders placed or performed during the hospital encounter of 04/01/23  Resp panel by RT-PCR (RSV, Flu A&B, Covid) Anterior Nasal  Swab     Status: None   Collection Time: 04/01/23 12:22 PM   Specimen: Anterior Nasal Swab  Result Value Ref Range Status   SARS Coronavirus 2 by RT PCR NEGATIVE NEGATIVE Final    Comment: (NOTE) SARS-CoV-2 target nucleic acids are NOT DETECTED.  The SARS-CoV-2 RNA is generally detectable in upper respiratory specimens during the acute phase of infection. The lowest concentration of SARS-CoV-2 viral copies this assay can detect is 138 copies/mL. A negative result does not preclude SARS-Cov-2 infection and should not be used as the sole basis for treatment or other patient management decisions. A negative result may occur with  improper specimen collection/handling, submission of specimen other than nasopharyngeal swab, presence of viral mutation(s) within the areas targeted by this assay, and inadequate number of viral copies(<138 copies/mL). A negative result must be combined with clinical observations, patient history, and epidemiological information. The expected result is Negative.  Fact Sheet for Patients:  BloggerCourse.com  Fact Sheet for Healthcare Providers:  SeriousBroker.it  This test is no t yet approved or cleared by the [place] FDA and  has been authorized for detection and/or diagnosis of SARS-CoV-2 by FDA under an Emergency Use Authorization  (EUA). This EUA will remain  in effect (meaning this test can be used) for the duration of the COVID-19 declaration under Section 564(b)(1) of the Act, 21 U.S.C.section 360bbb-3(b)(1), unless the authorization is terminated  or revoked sooner.       Influenza A by PCR NEGATIVE NEGATIVE Final   Influenza B by PCR NEGATIVE NEGATIVE Final    Comment: (NOTE) The Xpert Xpress SARS-CoV-2/FLU/RSV plus assay is intended as an aid in the diagnosis of influenza from Nasopharyngeal swab specimens and should not be used as a sole basis for treatment. Nasal washings and aspirates are unacceptable for Xpert Xpress SARS-CoV-2/FLU/RSV testing.  Fact Sheet for Patients: BloggerCourse.com  Fact Sheet for Healthcare Providers: SeriousBroker.it  This test is not yet approved or cleared by the [place] FDA and has been authorized for detection and/or diagnosis of SARS-CoV-2 by FDA under an Emergency Use Authorization (EUA). This EUA will remain in effect (meaning this test can be used) for the duration of the COVID-19 declaration under Section 564(b)(1) of the Act, 21 U.S.C. section 360bbb-3(b)(1), unless the authorization is terminated or revoked.     Resp Syncytial Virus by PCR NEGATIVE NEGATIVE Final    Comment: (NOTE) Fact Sheet for Patients: BloggerCourse.com  Fact Sheet for Healthcare Providers: SeriousBroker.it  This test is not yet approved or cleared by the [place] FDA and has been authorized for detection and/or diagnosis of SARS-CoV-2 by FDA under an Emergency Use Authorization (EUA). This EUA will remain in effect (meaning this test can be used) for the duration of the COVID-19 declaration under Section 564(b)(1) of the Act, 21 U.S.C. section 360bbb-3(b)(1), unless the authorization is terminated or revoked.  Performed at [facility], [address] [place], [place] [address]   Blood culture (routine x 2)     Status: None   Collection Time: 04/01/23 12:27 PM   Specimen: BLOOD  Result Value Ref Range Status   Specimen Description   Final    BLOOD LEFT ANTECUBITAL Performed at [facility], [address], [place], [place] [address]    Special Requests   Final    BOTTLES DRAWN AEROBIC AND ANAEROBIC Blood Culture adequate volume Performed at [facility], [address] [place], [place] [address]    Culture  Final    NO GROWTH 5 DAYS Performed at [facility], [address] [place], [place] [address]    Report Status 04/06/2023 FINAL  Final  Blood culture (routine x 2)     Status: None   Collection Time: 04/01/23 12:44 PM   Specimen: BLOOD LEFT FOREARM  Result Value Ref Range Status   Specimen Description   Final    BLOOD LEFT FOREARM Performed at [facility], [address], [place], [place] [address]    Special Requests   Final    BOTTLES DRAWN AEROBIC AND ANAEROBIC Blood Culture adequate volume Performed at [facility], [address] [place], [place] [address]    Culture   Final    NO GROWTH 5 DAYS Performed at [facility], [address] [place], [place] [address]    Report Status 04/06/2023 FINAL  Final  MRSA Next Gen by PCR, Nasal     Status: None   Collection Time: 04/03/23 12:05 PM   Specimen: Nasal Mucosa; Nasal Swab  Result Value Ref Range Status   MRSA by PCR Next Gen NOT DETECTED NOT DETECTED Final    Comment: (NOTE) The GeneXpert MRSA Assay (FDA approved for NASAL specimens only), is one component of a comprehensive MRSA colonization surveillance program. It is not intended to diagnose MRSA infection nor to guide or monitor treatment for MRSA infections. Test performance is not FDA approved in patients less than 36 years old. Performed at [facility] [address], [place], [place]  [address]    Labs: CBC: Recent Labs  Lab 04/05/23 0529 04/06/23 0522 04/06/23 1150 04/06/23 1153 04/06/23 1154  WBC 5.0 5.4  --   --   --   HGB 16.5* 17.1* 17.7* 18.0* 18.0*  HCT 48.3* 51.1* 52.0* 53.0* 53.0*  MCV 107.8* 109.2*  --   --   --   PLT 191 223  --   --   --    Basic Metabolic Panel: Recent Labs  Lab 04/05/23 0529 04/05/23 0824 04/05/23 2044 04/06/23 0522 04/06/23 1150 04/06/23 1153 04/06/23 1154 04/07/23 0511  NA 125* 126* 127* 127* 129* 129* 129* 132*  K 4.0 4.4 5.0 4.4 4.3 4.5 4.5 5.1  CL 81* 79* 83* 83*  --   --   --  86*  CO2 35* 35* 32 34*  --   --   --  38*  GLUCOSE 98 98 107* 98  --   --   --  96  BUN 26* 24* 37* 36*  --   --   --  36*  CREATININE 0.85 0.83 1.37* 1.19*  --   --   --  1.11*  CALCIUM 8.6* 9.0 9.0 8.8*  --   --   --  8.9  MG 2.0  --   --  2.5*  --   --   --  2.7*  PHOS  --   --   --  4.2  --   --   --  4.1   Liver Function Tests: Recent Labs  Lab 04/06/23 0522 04/07/23 0511  ALBUMIN 3.3* 3.2*   CBG: No results for input(s): "GLUCAP" in the last 168 hours.  Discharge time spent: greater than 30 minutes.  Author: Lynden Oxford, MD  [facility] 04/07/2023

## 2023-04-13 ENCOUNTER — Ambulatory Visit
Admission: RE | Admit: 2023-04-13 | Discharge: 2023-04-13 | Disposition: A | Discharge status: Home / Self Care | Payer: BC Managed Care – PPO | Source: Ambulatory Visit | Attending: Obstetrics & Gynecology | Admitting: Obstetrics & Gynecology

## 2023-04-13 DIAGNOSIS — Z1231 Encounter for screening mammogram for malignant neoplasm of breast: Secondary | ICD-10-CM | POA: Diagnosis not present

## 2023-04-18 DIAGNOSIS — I272 Pulmonary hypertension, unspecified: Secondary | ICD-10-CM | POA: Diagnosis not present

## 2023-04-19 ENCOUNTER — Ambulatory Visit: Payer: BC Managed Care – PPO | Attending: Physician Assistant | Admitting: Emergency Medicine

## 2023-04-19 ENCOUNTER — Encounter: Payer: Self-pay | Admitting: Emergency Medicine

## 2023-04-19 VITALS — BP 120/80 | HR 71 | Ht 65.0 in | Wt 142.2 lb

## 2023-04-19 DIAGNOSIS — E871 Hypo-osmolality and hyponatremia: Secondary | ICD-10-CM | POA: Diagnosis not present

## 2023-04-19 DIAGNOSIS — I272 Pulmonary hypertension, unspecified: Secondary | ICD-10-CM | POA: Diagnosis not present

## 2023-04-19 DIAGNOSIS — N179 Acute kidney failure, unspecified: Secondary | ICD-10-CM | POA: Diagnosis not present

## 2023-04-19 DIAGNOSIS — I5022 Chronic systolic (congestive) heart failure: Secondary | ICD-10-CM

## 2023-04-19 DIAGNOSIS — I5043 Acute on chronic combined systolic (congestive) and diastolic (congestive) heart failure: Secondary | ICD-10-CM | POA: Diagnosis not present

## 2023-04-19 MED ORDER — DAPAGLIFLOZIN PROPANEDIOL 10 MG PO TABS: 10.0000 mg | ORAL_TABLET | Freq: Every day | ORAL | 3 refills | Status: DC

## 2023-04-19 MED ORDER — CARVEDILOL 3.125 MG PO TABS: 3.1250 mg | ORAL_TABLET | Freq: Two times a day (BID) | ORAL | 3 refills | Status: DC

## 2023-04-19 NOTE — Progress Notes (Addendum)
Cardiology Office Note:    Date:  04/19/2023  ID:  Alveria Apley, DOB 12-16-1960, MRN [id number] PCP: Noberto Retort, MD  [facility] Providers Cardiologist:  Yates Decamp, MD       Patient Profile:      Gina Cervantes. [name] is a 62 year old female hx of moderately differentiated squamous cell anal cancer SP chemo therapy completed 10/02/2022 SP radiation therapy same time, right breast cancer completed chemotherapy in past now on Arimidex, SP radiation therapy as well to her chest, pulmonary fibrosis related to radiation therapy, chronic cor pulmonale with right-sided heart failure, postchemotherapy induced biventricular heart failure, moderate pulmonary hypertension WHO group 3  She initially presented to the ER on 04/01/2023 with a 2-day history of worsening dyspnea, leg edema, acute decompensated heart failure.  She was discharged on 04/07/2023. Cardiology was consulted due to new biventricular heart failure.  Echocardiogram 03/2023 showed EF 45%, grade 1 DD, D-shaped septum with moderately reduced RV function/enlargement, PASP 40.09.  Her echocardiogram shows stable EF of 45% but shows evidence of worsening right-sided heart failure.  Pulmonary hypertension likely related to postchemotherapy medications resulting in pulmonary fibrosis.  RHC/LHC on 11/24 showed no significant CAD, mean PA 28, PCWP 29, CI 2.12.  RHC confirm group 3 pulmonary hypertension.  She denies IV diuresis. She was started on carvedilol 3.125 mg twice daily and Farxiga 10 mg once daily.  She had trialed Entresto 24/26 mg however after the dose for systolic blood pressure dropped into the 70s this was discontinued.  Her GDMT was complicated due to hypotension. Home O2 was advised but she discontinued use.      History of Present Illness:   Gina Cervantes is a 62 y.o. female who returns for hospital follow-up for HFmrEF.   Today, the patient has been doing well since her discharge home.  She notes that she feels  significantly better with much improved shortness of breath.  She no longer is experiencing leg swelling, she can now lay flat in bed and she is not experiencing any chest pains.  Her weight has been stable at home within a pound of her discharge dry weight.  She notes very mild cough that is much improved and no longer experiencing DOE.  She denies any decrease in urination.  She has been compliant with her medication regimen.  She notes that she finished chemotherapy/radiation about 2 years ago.  She plans to see Dr. Tonia Brooms with pulmonology to discuss her pulmonary fibrosis within the next few weeks.  We discussed her cardiac catheterization and echocardiogram today and all questions were answered. She is not presently requiring supplemental O2, sat 95% in the office today.        Review of Systems  Constitutional: Negative for weight gain and weight loss.  Cardiovascular:  Negative for chest pain, claudication, dyspnea on exertion, irregular heartbeat, leg swelling, near-syncope, orthopnea, palpitations, paroxysmal nocturnal dyspnea and syncope.  Respiratory:  Positive for cough and shortness of breath. Negative for hemoptysis.   Gastrointestinal:  Negative for abdominal pain, hematochezia and melena.  Genitourinary:  Negative for hematuria.     See HPI    Studies Reviewed:       Right/Left heart catheterization 04/06/2023    Impression and recommendations: Mild coronary calcification involving the proximal LAD.  Otherwise normal coronary arteries. Mild pulmonary hypertension with low LVEDP and pulmonary capillary wedge suggests primary pulmonary hypertension (WHO group 3).  PAPi is preserved suggesting relatively preserved RV systolic function reserve.   Will  start the patient on Farxiga 5 mg daily for heart failure management.  Continue Entresto and Coreg at low-dose in view of soft blood pressure.  She will need referral to pulmonary medicine to evaluate for pulmonary fibrosis and  pulmonary hypertension and consideration for starting her on Tyvaso for pulmonary hypertension as a disease modifying agent for WHO group 3 PAH. Start low-dose statins in view of coronary calcification.  Echocardiogram 04/02/2023 1. Left ventricular ejection fraction, by estimation, is 45%. The left  ventricle has mildly decreased function. The left ventricle demonstrates  global hypokinesis. Left ventricular diastolic parameters are consistent  with Grade I diastolic dysfunction  (impaired relaxation).   2. D-shaped septum suggestive of RV pressure/volume overload. Right  ventricular systolic function is moderately reduced. The right ventricular  size is moderately enlarged. There is mildly elevated pulmonary artery  systolic pressure. The estimated right  ventricular systolic pressure is 40.9 mmHg.   3. Right atrial size was mildly dilated.   4. The mitral valve is normal in structure. No evidence of mitral valve  regurgitation. No evidence of mitral stenosis.   5. The aortic valve is tricuspid. There is mild calcification of the  aortic valve. Aortic valve regurgitation is not visualized. No aortic  stenosis is present.   6. The inferior vena cava is normal in size with <50% respiratory  variability, suggesting right atrial pressure of 8 mmHg.   Echocardiogram 12/24/2020 1. LV apical false tendon (normal variant). Left ventricular ejection  fraction, by estimation, is 45 to 50%. The left ventricle has mildly  decreased function. The left ventricle demonstrates global hypokinesis.  Left ventricular diastolic parameters are  consistent with Grade I diastolic dysfunction (impaired relaxation).   2. Right ventricular systolic function is low normal. The right  ventricular size is normal. There is normal pulmonary artery systolic  pressure. The estimated right ventricular systolic pressure is 25.7 mmHg.   3. The pericardial effusion is circumferential. There is no evidence of  cardiac  tamponade.   4. The mitral valve is grossly normal. Trivial mitral valve  regurgitation.   5. The aortic valve is tricuspid. Aortic valve regurgitation is not  visualized.   6. The inferior vena cava is normal in size with greater than 50%  respiratory variability, suggesting right atrial pressure of 3 mmHg.  Risk Assessment/Calculations:             Physical Exam:   VS:  BP 120/80   Pulse 71   Ht 5\' 5"  (1.651 m)   Wt 142 lb 3.2 oz (64.5 kg)   LMP 04/07/2008   SpO2 95%   BMI 23.66 kg/m    Wt Readings from Last 3 Encounters:  04/19/23 142 lb 3.2 oz (64.5 kg)  04/07/23 140 lb 14 oz (63.9 kg)  03/15/23 148 lb 1.6 oz (67.2 kg)    Constitutional:      Appearance: Normal and healthy appearance.  HENT:     Head: Normocephalic.  Neck:     Vascular: JVD normal.  Pulmonary:     Effort: Pulmonary effort is normal.     Breath sounds: Normal breath sounds.  Chest:     Chest wall: Not tender to palpatation.  Cardiovascular:     PMI at left midclavicular line. Normal rate. Regular rhythm. Normal S1. Normal S2.      Murmurs: There is no murmur.     No gallop.  No click. No rub.  Pulses:    Intact distal pulses.  Edema:  Peripheral edema absent.  Musculoskeletal: Normal range of motion.     Cervical back: Normal range of motion and neck supple. Skin:    General: Skin is warm and dry.  Neurological:     General: No focal deficit present.     Mental Status: Alert, oriented to person, place, and time and oriented to person, place and time.  Psychiatric:        Attention and Perception: Attention and perception normal.        Mood and Affect: Mood normal.        Behavior: Behavior is cooperative.        Thought Content: Thought content normal.       Assessment and Plan:  Chronic HFmrEF -12/2020 echo: LVEF 45-50%, grade I dd, low normal RV function, PASP 26 -03/2023 echo: LVEF 45%, grade I dd, D shaped septum, mod RV dysfunction, PASP 41 -03/2023 RHC/LHC: no significant  CAD -She is euvolemic and well compensated on exam.  Much improved SOB/DOE.  No longer with leg swelling, orthopnea.  Continues to be at dry weight. -GDMT complicated by hypotension/hyponatremia.  Her blood pressures typically run low 100s at home.  Attempted Entresto 24/26mg  during admission, however she became hypotensive w/ BP in 70s while becoming symptomatic.  Will hold off on spironolactone at this time given hyponatremia of 127 and K of 5. -Continue GDMT farxiga 10mg , carvedilol 3.125mg  twice daily, Lasix 20mg  as needed -CMET today.  Pulmonary hypertension -Likely group III pulmonary HTN given likely pulmonary fibrosis from radiation in the past  -RHC w/ mild pulmonary HTN, mean PA 28 and PCWP 9 -Likely group 3 pulmonary HTN given pulmonary fibrosis from radiation in the past -SOB/DOE has improved significantly.  She plans to follow with pulmonology Dr. Tonia Brooms within the coming weeks.  Hyponatremia / AKI -On 04/05/2023 sodium 127, creatinine 1.37, GFR 44 -Repeat CMP and CBC today               Dispo:  Follow-up 3 months with APP.  Signed, Denyce Robert, NP

## 2023-04-19 NOTE — Patient Instructions (Signed)
Medication Instructions:   *If you need a refill on your cardiac medications before your next appointment, please call your pharmacy*   Lab Work: CMET AND CBC TODAY  If you have labs (blood work) drawn today and your tests are completely normal, you will receive your results only by: MyChart Message (if you have MyChart) OR A paper copy in the mail If you have any lab test that is abnormal or we need to change your treatment, we will call you to review the results.   Testing/Procedures:    Follow-Up: At [facility], you and your health needs are our priority.  As part of our continuing mission to provide you with exceptional heart care, we have created designated Provider Care Teams.  These Care Teams include your primary Cardiologist (physician) and Advanced Practice Providers (APPs -  Physician Assistants and Nurse Practitioners) who all work together to provide you with the care you need, when you need it.  We recommend signing up for the patient portal called "MyChart".  Sign up information is provided on this After Visit Summary.  MyChart is used to connect with patients for Virtual Visits (Telemedicine).  Patients are able to view lab/test results, encounter notes, upcoming appointments, etc.  Non-urgent messages can be sent to your provider as well.   To learn more about what you can do with MyChart, go to [email or web address].    Your next appointment:   3 month(s)  Provider:   Ronie Spies, PA-C         Other Instructions

## 2023-04-20 ENCOUNTER — Telehealth: Payer: Self-pay | Admitting: Pulmonary Disease

## 2023-04-20 ENCOUNTER — Telehealth: Payer: Self-pay

## 2023-04-20 DIAGNOSIS — E875 Hyperkalemia: Secondary | ICD-10-CM

## 2023-04-20 LAB — COMPREHENSIVE METABOLIC PANEL
ALT: 19 [IU]/L (ref 0–32)
AST: 22 [IU]/L (ref 0–40)
Albumin: 4.1 g/dL (ref 3.8–4.9)
Alkaline Phosphatase: 71 [IU]/L (ref 44–121)
BUN/Creatinine Ratio: 21 (ref 12–28)
BUN: 21 mg/dL (ref 8–27)
Bilirubin Total: 0.4 mg/dL (ref 0.0–1.2)
CO2: 30 mmol/L — ABNORMAL HIGH (ref 20–29)
Calcium: 9.5 mg/dL (ref 8.7–10.3)
Chloride: 96 mmol/L (ref 96–106)
Creatinine, Ser: 0.99 mg/dL (ref 0.57–1.00)
Globulin, Total: 2.9 g/dL (ref 1.5–4.5)
Glucose: 91 mg/dL (ref 70–99)
Potassium: 5.3 mmol/L — ABNORMAL HIGH (ref 3.5–5.2)
Sodium: 139 mmol/L (ref 134–144)
Total Protein: 7 g/dL (ref 6.0–8.5)
eGFR: 65 mL/min/{1.73_m2} (ref >59)

## 2023-04-20 LAB — CBC
Hematocrit: 45.9 % (ref 34.0–46.6)
Hemoglobin: 15.5 g/dL (ref 11.1–15.9)
MCH: 36 pg — ABNORMAL HIGH (ref 26.6–33.0)
MCHC: 33.8 g/dL (ref 31.5–35.7)
MCV: 107 fL — ABNORMAL HIGH (ref 79–97)
Platelets: 329 10*3/uL (ref 150–450)
RBC: 4.3 x10E6/uL (ref 3.77–5.28)
RDW: 13.6 % (ref 11.7–15.4)
WBC: 6.3 10*3/uL (ref 3.4–10.8)

## 2023-04-20 NOTE — Telephone Encounter (Signed)
Patient is requesting a hospital follow-up with Dr Tonia Brooms. She was in hospital for respiratory failure on 11/24-11/30. She is not wanting to see anyone but MD. There is a 15 minute hold on 12/18. Please advise if it is okay to schedule patient on that day. Please route message back to [facility] and I will call her on Monday to offer appointment if this is approved.

## 2023-04-20 NOTE — Telephone Encounter (Signed)
-----   Message from Laurann Montana sent at 04/20/2023  8:01 AM EST ----- Labs erroneously came to me, saw [facility] yesterday (I was precepting). Wyn Forster is on admin today; I will cc him here on results. Please let pt know labs overall look stable. Blood count looks closer to normal though MCV up which can sometimes indicate B12/folate deficiency so should f/u PCP for this. Potassium trending high side of normal, slightly above. CO2 elevated but improved. Please have her return for stat recheck potassium this morning if she is able to - make sure to hydrate well. I discussed with Dr. Lynnette Caffey - no need to acutely hold any meds for this presently (we had discussed SGLT2i). If she can't come today, would re-order stat potassium for Monday under [facility] NP. I would recommend she review all of her supplements/meds to ensure she is not taking in any excess potassium from any sources, including salt substitutes. Reduce dietary intake of high sources of potassium (bananas, squash, yogurt, white beans, sweet potatoes, leafy greens, and avocados). Thank you!

## 2023-04-20 NOTE — Telephone Encounter (Signed)
Spoke with patient and discussed lab results.   Per Ronie Spies, PA-C: Please have her return for stat recheck potassium this morning if she is able to - make sure to hydrate well.   STAT BMET ordered for potassium recheck. Patient verbalized understanding and states she will come in today for repeat lab.

## 2023-04-25 ENCOUNTER — Ambulatory Visit: Payer: BC Managed Care – PPO | Admitting: Pulmonary Disease

## 2023-04-25 ENCOUNTER — Encounter: Payer: Self-pay | Admitting: Pulmonary Disease

## 2023-04-25 VITALS — BP 125/68 | HR 83 | Ht 65.0 in | Wt 141.6 lb

## 2023-04-25 DIAGNOSIS — Z85118 Personal history of other malignant neoplasm of bronchus and lung: Secondary | ICD-10-CM

## 2023-04-25 DIAGNOSIS — Z85048 Personal history of other malignant neoplasm of rectum, rectosigmoid junction, and anus: Secondary | ICD-10-CM

## 2023-04-25 DIAGNOSIS — C3490 Malignant neoplasm of unspecified part of unspecified bronchus or lung: Secondary | ICD-10-CM

## 2023-04-25 DIAGNOSIS — Z853 Personal history of malignant neoplasm of breast: Secondary | ICD-10-CM

## 2023-04-25 DIAGNOSIS — E875 Hyperkalemia: Secondary | ICD-10-CM | POA: Diagnosis not present

## 2023-04-25 NOTE — Patient Instructions (Signed)
Thank you for visiting Dr. Tonia Brooms at [facility]. Today we recommend the following:  Return in about 8 weeks (around 06/20/2023) for w/ Dr. Tonia Brooms.    Please do your part to reduce the spread of COVID-19.

## 2023-04-25 NOTE — Progress Notes (Signed)
Synopsis: Referred in July 2022 for abnormal CT chest, oncology: Dr. Truett Perna, PCP: By Noberto Retort, MD  Subjective:   PATIENT ID: Gina Cervantes GENDER: female DOB: 21-Apr-1961, MRN: [id number]  Chief Complaint  Patient presents with   Hospitalization Follow-up    Pt denies any concerns, using breztri     62 year old female, past medical history of anal cancer in 2014, breast cancer in 2009, lung cancer in 2018.  Patient has underwent chemotherapy as well as radiation treatments.  She is followed by Dr. Truett Perna and Lonna Cobb, NP at the [facility].  She is currently undergoing treatment for her non-small cell lung cancer.  She completed an additional cycle of Alimta plus pembrolizumab in June 2022.  Overall her history includes a anal cancer, left anal margin, anal canal mass which was a moderately differentiated squamous cell carcinoma, p16 positive no evidence of hypermetabolic metastasis treated with concurrent radiation and chemotherapy.  In 2009 she had a right breast cancer that was ER/PR positive, HER2 positive.  She had a subsolid right lower lobe nodule that has slowly increased in size.  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  Ultimately in December 2020 underwent a biopsy of the level 7 lymph node that was negative.  Had a CT-guided biopsy of a right upper lobe nodule that had lipidic adenocarcinoma with acinar patterns.  She had SBRT treatments to a right lower lobe nodule that was slowly enlarging.  In July 2021 she had a lower lobe lesion that had slightly decreased in size but had progressive component associated with a dominant right upper lobe nodule.  Patient was started on treatments with Alimta, carboplatinum plus pembrolizumab.  Patient had a repeat CT scan of the chest in July 2022 which showed enlarging soft tissue density in the right upper  lobe medial adjacent to the mediastinum measuring 2.7 x 2.1 cm concerning for recurrent malignancy.  Additionally there is a small new right sided pleural effusion mediastinal adenopathy was stable. Former smoker quit in 2009, 20+ years, <1ppd.   OV 11/29/2021:Patient here today for office follow-up.  Office note from Dr. Truett Perna 11/01/2021 reviewed.  Overall doing well.  Asymptomatic from non-small cell lung cancer.  CT imaging stable.  She is doing great today.  She is very upbeat has no complaints feels like she is breathing well.  Currently under image surveillance with Dr. Truett Perna and oncology.  She has a trip planned to [place] coming up in September.  She just got back from [place] a couple of weeks ago.  OV 04/25/2023:Patient here today for follow-up after recent hospitalization.  Patient was admitted to [facility] long hospital discharged on 04/07/2023.  Discharge summary reviewed.  Patient was hypoxemic requiring O2 supplementation and there was some concern for exacerbation of underlying lung disease possible fibrosis from radiation treatments.  Upon presentation to the emergency room she received CT imaging of the chest which revealed no evidence of pulmonary embolism no new lung mass.  There was redemonstration of multiple masslike opacities throughout the bilateral lungs she had a restaging nuclear medicine PET scan at the beginning of November that showed multifocal areas of masslike architectural distortion she has multiple part solid nodules in the left lung noted that are stable in size and FDG avidity concerning for multifocal adenocarcinoma.    Oncology History Overview Note  Dr. Kenard Gower reviewed the pathology slides  from 2018 and 2020.  He reports the 2018 case is consistent with adenocarcinoma with a less than 5% squamous component.  The 2020 case is involved with a comparable carcinoma, but direct comparison is difficult due to the limited available tissue on the 2020 case.   Lung  cancer, left  upper lobe (HCC)  07/05/2016 Initial Diagnosis   Lung cancer, left  upper lobe (HCC)   02/04/2020 - 10/18/2020 Chemotherapy         01/19/2021 - 03/28/2021 Chemotherapy   Patient is on Treatment Plan : LUNG CARBOplatin / Pemetrexed / Pembrolizumab q21d Induction x 4 cycles / Maintenance Pemetrexed + Pembrolizumab     06/20/2021 - 06/21/2021 Chemotherapy   Patient is on Treatment Plan : NON-SMALL CELL LUNG RESEARCH SWOG S1900E Sotorasib (AMGEN 510) q21d     Non-small cell lung cancer with metastasis (HCC)  12/16/2020 Initial Diagnosis   Non-small cell lung cancer with metastasis (HCC)   01/19/2021 - 03/28/2021 Chemotherapy   Patient is on Treatment Plan : LUNG CARBOplatin / Pemetrexed / Pembrolizumab q21d Induction x 4 cycles / Maintenance Pemetrexed + Pembrolizumab     06/20/2021 - 06/21/2021 Chemotherapy   Patient is on Treatment Plan : NON-SMALL CELL LUNG RESEARCH SWOG S1900E Sotorasib (AMGEN 510) q21d        Past Medical History:  Diagnosis Date   Anal cancer (HCC) 08/09/2012   Squamous cell   Anxiety    PANIC ATTACKS   Arthritis    Breast cancer (HCC) 2009   ER+PR+HER-2+   Ductal carcinoma (HCC) 02/2008   invasive    Heart palpitations    History of radiation therapy 09/02/12-10/10/12   anal 50.4Gy   History of shingles 10/2014   HPV (human papilloma virus) anogenital infection    vulvar/ freezing hx   Hx of radiation therapy 2020   Lung nodule    Malignant neoplasm of upper lobe of left lung (HCC) 06/2016   Personal history of chemotherapy    Personal history of radiation therapy 2010   Pneumonia    NO RECENT PROBLEMS   PONV (postoperative nausea and vomiting)    Hypotension   Radiation 10/21/08-12/08/08   Right breast 6240 cGy   Shingles 2017   Status post chemotherapy 02/2008   Taxol/Herceptin     Family History  Problem Relation Age of Onset   Lung cancer Father    Stroke Mother    Breast cancer Neg Hx      Past Surgical History:  Procedure  Laterality Date   BREAST BIOPSY Right 02/04/2008   malignant   BREAST LUMPECTOMY Right 2010   malignant   BREAST LUMPECTOMY WITH AXILLARY LYMPH NODE BIOPSY Right 5/10   Dr. Carolynne Edouard   BRONCHIAL BIOPSY  12/28/2020   Procedure: BRONCHIAL BIOPSIES;  Surgeon: Josephine Igo, DO;  Location: [facility];  Service: Pulmonary;;   BRONCHIAL BRUSHINGS  12/28/2020   Procedure: BRONCHIAL BRUSHINGS;  Surgeon: Josephine Igo, DO;  Location: [facility];  Service: Pulmonary;;   BRONCHIAL NEEDLE ASPIRATION BIOPSY  12/28/2020   Procedure: BRONCHIAL NEEDLE ASPIRATION BIOPSIES;  Surgeon: Josephine Igo, DO;  Location: [facility];  Service: Pulmonary;;   BRONCHIAL WASHINGS  12/28/2020   Procedure: BRONCHIAL WASHINGS;  Surgeon: Josephine Igo, DO;  Location: [facility];  Service: Pulmonary;;   CESAREAN SECTION     COLONOSCOPY W/ POLYPECTOMY     ENDOBRONCHIAL ULTRASOUND  12/28/2020   Procedure: ENDOBRONCHIAL ULTRASOUND;  Surgeon: Josephine Igo, DO;  Location: [facility];  Service:  Pulmonary;;   EVALUATION UNDER ANESTHESIA WITH ANAL FISTULECTOMY N/A 08/09/2012   Procedure: EXAM UNDER ANESTHESIA AND BIOPSY OF ANAL MASS;  Surgeon: Adolph Pollack, MD;  Location: [facility];  Service: General;  Laterality: N/A;   IR IMAGING GUIDED PORT INSERTION  01/12/2021   IR RADIOLOGIST EVAL & MGMT  03/11/2021   IR REMOVAL TUN ACCESS W/ PORT W/O FL MOD SED  02/24/2021   KNEE ARTHROSCOPY Left    left knee   LUNG LOBECTOMY Left    PORTACATH PLACEMENT  03/2008   dr. Carolynne Edouard    REMOVAL PORTACATH  2011   RIGHT/LEFT HEART CATH AND CORONARY ANGIOGRAPHY N/A 04/06/2023   Procedure: RIGHT/LEFT HEART CATH AND CORONARY ANGIOGRAPHY;  Surgeon: Yates Decamp, MD;  Location: [facility];  Service: Cardiovascular;  Laterality: N/A;   VIDEO ASSISTED THORACOSCOPY (VATS)/WEDGE RESECTION Left 07/05/2016   Procedure: VIDEO ASSISTED THORACOSCOPY (VATS)/WEDGE RESECTION left upper lobe,  lymph node dissection and placement of OnQ catheter;   Surgeon: Delight Ovens, MD;  Location: [facility] OR;  Service: Thoracic;  Laterality: Left;   VIDEO BRONCHOSCOPY N/A 07/05/2016   Procedure: VIDEO BRONCHOSCOPY;  Surgeon: Delight Ovens, MD;  Location: [facility] OR;  Service: Thoracic;  Laterality: N/A;   VIDEO BRONCHOSCOPY N/A 05/03/2018   Procedure: VIDEO BRONCHOSCOPY;  Surgeon: Delight Ovens, MD;  Location: [facility] OR;  Service: Thoracic;  Laterality: N/A;   VIDEO BRONCHOSCOPY WITH ENDOBRONCHIAL NAVIGATION N/A 05/03/2018   Procedure: VIDEO BRONCHOSCOPY WITH ENDOBRONCHIAL NAVIGATION WITH BIOPSY;  Surgeon: Delight Ovens, MD;  Location: [facility] OR;  Service: Thoracic;  Laterality: N/A;   VIDEO BRONCHOSCOPY WITH ENDOBRONCHIAL NAVIGATION Bilateral 12/28/2020   Procedure: VIDEO BRONCHOSCOPY WITH ENDOBRONCHIAL NAVIGATION;  Surgeon: Josephine Igo, DO;  Location: [facility];  Service: Pulmonary;  Laterality: Bilateral;  ION   VIDEO BRONCHOSCOPY WITH ENDOBRONCHIAL ULTRASOUND N/A 05/03/2018   Procedure: VIDEO BRONCHOSCOPY WITH ENDOBRONCHIAL ULTRASOUND;  Surgeon: Delight Ovens, MD;  Location: [facility] OR;  Service: Thoracic;  Laterality: N/A;   VIDEO BRONCHOSCOPY WITH RADIAL ENDOBRONCHIAL ULTRASOUND  12/28/2020   Procedure: RADIAL ENDOBRONCHIAL ULTRASOUND;  Surgeon: Josephine Igo, DO;  Location: [facility];  Service: Pulmonary;;    Social History   Socioeconomic History   Marital status: Married    Spouse name: Not on file   Number of children: Not on file   Years of education: Not on file   Highest education level: Not on file  Occupational History    Employer: [employer], [place], [place]    Comment: Realtor  Tobacco Use   Smoking status: Former    Current packs/day: 0.00    Types: Cigarettes    Start date: 2007    Quit date: 2015    Years since quitting: 9.9   Smokeless tobacco: Never   Tobacco comments:    stopped smoking cigarettes Jan. 2018  Vaping Use   Vaping status: Never Used  Substance and Sexual Activity   Alcohol use:  Not Currently    Alcohol/week: 0.0 - 5.0 standard drinks of alcohol    Comment: every day   Drug use: No   Sexual activity: Yes    Partners: Male    Birth control/protection: Other-see comments, Post-menopausal    Comment: vasectomy  Other Topics Concern   Not on file  Social History Narrative   Married   Lost a son age 15 this year and husband's brother, still going through grieving process.   No family history of breast or ovarian cancer.  Employed as Veterinary surgeon   Social Drivers of Corporate investment banker Strain: Not on file  Food Insecurity: No Food Insecurity (04/01/2023)   Hunger Vital Sign    Worried About Running Out of Food in the Last Year: Never true    Ran Out of Food in the Last Year: Never true  Transportation Needs: No Transportation Needs (04/01/2023)   PRAPARE - Administrator, Civil Service (Medical): No    Lack of Transportation (Non-Medical): No  Physical Activity: Not on file  Stress: Not on file  Social Connections: Not on file  Intimate Partner Violence: Not At Risk (04/01/2023)   Humiliation, Afraid, Rape, and Kick questionnaire    Fear of Current or Ex-Partner: No    Emotionally Abused: No    Physically Abused: No    Sexually Abused: No     No Known Allergies    Outpatient Medications Prior to Visit  Medication Sig Dispense Refill   albuterol (VENTOLIN HFA) 108 (90 Base) MCG/ACT inhaler INHALE 2 PUFFS BY MOUTH EVERY 6 HOURS AS NEEDED FOR WHEEZING OR SHORTNESS OF BREATH (Patient taking differently: Inhale 2 puffs into the lungs every 6 (six) hours as needed for wheezing or shortness of breath.) 8.5 g 1   Budeson-Glycopyrrol-Formoterol (BREZTRI AEROSPHERE) 160-9-4.8 MCG/ACT AERO Inhale 2 puffs into the lungs in the morning and at bedtime. 10.7 g 6   CALCIUM PO Take 1 tablet by mouth daily. Dosage unknown     carvedilol (COREG) 3.125 MG tablet Take 1 tablet (3.125 mg total) by mouth 2 (two) times daily with a meal. 180 tablet 3    dapagliflozin propanediol (FARXIGA) 10 MG TABS tablet Take 1 tablet (10 mg total) by mouth daily. 90 tablet 3   furosemide (LASIX) 20 MG tablet Take 1 tablet (20 mg total) by mouth daily as needed (for weight gain of 3 lbs in 1 day or 5 lbs in 1 week). 30 tablet 0   LORazepam (ATIVAN) 0.5 MG tablet Take 1 tablet (0.5 mg total) by mouth every 8 (eight) hours as needed for anxiety (or nausea). 60 tablet 0   No facility-administered medications prior to visit.    Review of Systems  Constitutional:  Negative for chills, fever, malaise/fatigue and weight loss.  HENT:  Negative for hearing loss, sore throat and tinnitus.   Eyes:  Negative for blurred vision and double vision.  Respiratory:  Negative for cough, hemoptysis, sputum production, shortness of breath, wheezing and stridor.   Cardiovascular:  Negative for chest pain, palpitations, orthopnea, leg swelling and PND.  Gastrointestinal:  Negative for abdominal pain, constipation, diarrhea, heartburn, nausea and vomiting.  Genitourinary:  Negative for dysuria, hematuria and urgency.  Musculoskeletal:  Negative for joint pain and myalgias.  Skin:  Negative for itching and rash.  Neurological:  Negative for dizziness, tingling, weakness and headaches.  Endo/Heme/Allergies:  Negative for environmental allergies. Does not bruise/bleed easily.  Psychiatric/Behavioral:  Negative for depression. The patient is not nervous/anxious and does not have insomnia.   All other systems reviewed and are negative.    Objective:  Physical Exam Vitals reviewed.  Constitutional:      General: She is not in acute distress.    Appearance: She is well-developed.  HENT:     Head: Normocephalic and atraumatic.  Eyes:     General: No scleral icterus.    Conjunctiva/sclera: Conjunctivae normal.     Pupils: Pupils are equal, round, and reactive to light.  Neck:     Vascular:  No JVD.     Trachea: No tracheal deviation.  Cardiovascular:     Rate and Rhythm:  Normal rate and regular rhythm.     Heart sounds: Normal heart sounds.  Pulmonary:     Effort: Pulmonary effort is normal. No tachypnea, accessory muscle usage or respiratory distress.     Breath sounds: No stridor. No wheezing, rhonchi or rales.  Abdominal:     General: There is no distension.     Palpations: Abdomen is soft.     Tenderness: There is no abdominal tenderness.  Musculoskeletal:        General: No tenderness.     Cervical back: Neck supple.  Lymphadenopathy:     Cervical: No cervical adenopathy.  Skin:    General: Skin is warm and dry.     Capillary Refill: Capillary refill takes less than 2 seconds.     Findings: No rash.  Neurological:     Mental Status: She is alert and oriented to person, place, and time.  Psychiatric:        Behavior: Behavior normal.      Vitals:   04/25/23 1535  BP: 125/68  Pulse: 83  SpO2: 91%  Weight: 141 lb 9.6 oz (64.2 kg)  Height: 5\' 5"  (1.651 m)     91% on 2 L BMI Readings from Last 3 Encounters:  04/25/23 23.56 kg/m  04/19/23 23.66 kg/m  04/07/23 23.44 kg/m   Wt Readings from Last 3 Encounters:  04/25/23 141 lb 9.6 oz (64.2 kg)  04/19/23 142 lb 3.2 oz (64.5 kg)  04/07/23 140 lb 14 oz (63.9 kg)     CBC    Component Value Date/Time   WBC 6.3 04/19/2023 1430   WBC 5.4 04/06/2023 0522   RBC 4.30 04/19/2023 1430   RBC 4.68 04/06/2023 0522   HGB 15.5 04/19/2023 1430   HGB 13.5 01/02/2013 1510   HCT 45.9 04/19/2023 1430   HCT 39.1 01/02/2013 1510   PLT 329 04/19/2023 1430   MCV 107 (H) 04/19/2023 1430   MCV 105.7 (H) 01/02/2013 1510   MCH 36.0 (H) 04/19/2023 1430   MCH 36.5 (H) 04/06/2023 0522   MCHC 33.8 04/19/2023 1430   MCHC 33.5 04/06/2023 0522   RDW 13.6 04/19/2023 1430   RDW 13.6 01/02/2013 1510   LYMPHSABS 0.8 04/01/2023 1141   LYMPHSABS 1.6 01/02/2013 1510   MONOABS 0.8 04/01/2023 1141   MONOABS 0.7 01/02/2013 1510   EOSABS 0.1 04/01/2023 1141   EOSABS 0.2 01/02/2013 1510   BASOSABS 0.0  04/01/2023 1141   BASOSABS 0.0 01/02/2013 1510     Chest Imaging: 11/12/2020 CT chest: Enlarging soft tissue density, right upper lobe medial adjacent to the mediastinum measuring 2.7 x 2.1 cm.  Additionally has a right lower lobe lesion as well as a left lower lobe lesion of which are smaller but still concerning for malignancy. The patient's images have been independently reviewed by me.    Pulmonary Functions Testing Results:    Latest Ref Rng & Units 06/30/2016   11:04 AM  PFT Results  FVC-Pre L 2.03   FVC-Predicted Pre % 56   FVC-Post L 2.57   FVC-Predicted Post % 71   Pre FEV1/FVC % % 73   Post FEV1/FCV % % 73   FEV1-Pre L 1.48   FEV1-Predicted Pre % 52   FEV1-Post L 1.88   DLCO uncorrected ml/min/mmHg 17.68   DLCO UNC% % 69   DLVA Predicted % 103   TLC L 5.14  TLC % Predicted % 98   RV % Predicted % 152     FeNO:   Pathology:   Echocardiogram:   Heart Catheterization:     Assessment & Plan:     ICD-10-CM   1. Non-small cell lung cancer with metastasis (HCC)  C34.90     2. History of anal cancer  Z85.048     3. History of breast cancer  Z85.3     4. History of lung cancer  Z85.118       Discussion:  This is a 62 year old female with complex medical history, multiple malignancies 3 separate malignancies breast anal and non-small cell.  She has several changing subsolid lesions in the left chest that are concerning for multifocal adenocarcinoma.  She has a areas of radiation induced bronchiectasis and consolidation in the right lung.  Plan: We talked about various options today. I think with the changing lesions in the left lung we should consider bronchoscopy and biopsy. She is not very interested in considering chemo or ongoing therapies. I did introduce the concept of pulsed electrical field ablation therapy. I do think that she would potentially be a good candidate for treatment in the left lung to avoid additional radiation needs there. She is  agreeable to this plan. She is on to talk to her husband about this and we would consider bronchoscopy biopsy and treatment all at the same time.    Current Outpatient Medications:    albuterol (VENTOLIN HFA) 108 (90 Base) MCG/ACT inhaler, INHALE 2 PUFFS BY MOUTH EVERY 6 HOURS AS NEEDED FOR WHEEZING OR SHORTNESS OF BREATH (Patient taking differently: Inhale 2 puffs into the lungs every 6 (six) hours as needed for wheezing or shortness of breath.), Disp: 8.5 g, Rfl: 1   Budeson-Glycopyrrol-Formoterol (BREZTRI AEROSPHERE) 160-9-4.8 MCG/ACT AERO, Inhale 2 puffs into the lungs in the morning and at bedtime., Disp: 10.7 g, Rfl: 6   CALCIUM PO, Take 1 tablet by mouth daily. Dosage unknown, Disp: , Rfl:    carvedilol (COREG) 3.125 MG tablet, Take 1 tablet (3.125 mg total) by mouth 2 (two) times daily with a meal., Disp: 180 tablet, Rfl: 3   dapagliflozin propanediol (FARXIGA) 10 MG TABS tablet, Take 1 tablet (10 mg total) by mouth daily., Disp: 90 tablet, Rfl: 3   furosemide (LASIX) 20 MG tablet, Take 1 tablet (20 mg total) by mouth daily as needed (for weight gain of 3 lbs in 1 day or 5 lbs in 1 week)., Disp: 30 tablet, Rfl: 0   LORazepam (ATIVAN) 0.5 MG tablet, Take 1 tablet (0.5 mg total) by mouth every 8 (eight) hours as needed for anxiety (or nausea)., Disp: 60 tablet, Rfl: 0   Josephine Igo, DO [facility] 04/25/2023 4:41 PM

## 2023-04-26 ENCOUNTER — Other Ambulatory Visit: Payer: Self-pay

## 2023-04-26 ENCOUNTER — Other Ambulatory Visit: Payer: Self-pay | Admitting: Emergency Medicine

## 2023-04-26 DIAGNOSIS — E875 Hyperkalemia: Secondary | ICD-10-CM

## 2023-04-26 LAB — BASIC METABOLIC PANEL
BUN/Creatinine Ratio: 26 (ref 12–28)
BUN: 22 mg/dL (ref 8–27)
CO2: 29 mmol/L (ref 20–29)
Calcium: 9.8 mg/dL (ref 8.7–10.3)
Chloride: 96 mmol/L (ref 96–106)
Creatinine, Ser: 0.86 mg/dL (ref 0.57–1.00)
Glucose: 100 mg/dL — ABNORMAL HIGH (ref 70–99)
Potassium: 5.7 mmol/L — ABNORMAL HIGH (ref 3.5–5.2)
Sodium: 138 mmol/L (ref 134–144)
eGFR: 77 mL/min/{1.73_m2} (ref >59)

## 2023-04-26 NOTE — Telephone Encounter (Addendum)
I have called and spoken to patient today regarding her elevated potassium levels on her recent blood work.  Of note her potassium was 5.3 on 04/19/2023 and increased to 5.7 on 04/25/2023.  I will discontinue her Marcelline Deist and Carvedilol at this time given her potassium levels were normal and stable prior to the initiation of just these two medications during her hospital admission on 04/01/2023. She is not currently on any medications at this time known to increase potassium. Her diet is not high in potassium rich food and she does not take additional supplements. She will take 10g/packet Lokelma today and have a repeat BMP on 04/30/23. She was given another packet of Lokelma if needed. She will take lasix 20mg  as needed for any fluid retention.  We will schedule her with the next available APP.

## 2023-04-27 DIAGNOSIS — E875 Hyperkalemia: Secondary | ICD-10-CM | POA: Diagnosis not present

## 2023-04-28 ENCOUNTER — Other Ambulatory Visit: Payer: Self-pay | Admitting: Emergency Medicine

## 2023-04-28 DIAGNOSIS — E875 Hyperkalemia: Secondary | ICD-10-CM

## 2023-04-28 LAB — BASIC METABOLIC PANEL
BUN/Creatinine Ratio: 27 (ref 12–28)
BUN: 22 mg/dL (ref 8–27)
CO2: 28 mmol/L (ref 20–29)
Calcium: 9.8 mg/dL (ref 8.7–10.3)
Chloride: 97 mmol/L (ref 96–106)
Creatinine, Ser: 0.83 mg/dL (ref 0.57–1.00)
Glucose: 94 mg/dL (ref 70–99)
Potassium: 5.7 mmol/L — ABNORMAL HIGH (ref 3.5–5.2)
Sodium: 142 mmol/L (ref 134–144)
eGFR: 81 mL/min/{1.73_m2} (ref >59)

## 2023-05-01 ENCOUNTER — Ambulatory Visit: Payer: BC Managed Care – PPO | Admitting: Cardiology

## 2023-05-01 ENCOUNTER — Telehealth: Payer: Self-pay | Admitting: Emergency Medicine

## 2023-05-01 NOTE — Telephone Encounter (Signed)
Pt called on 04/28/23. Potassium remained stable. She is to take second dose of Lokelma and to reinitiate [place] per Dr. Jacinto Halim, continue to hold Carvedilol.

## 2023-05-07 ENCOUNTER — Other Ambulatory Visit: Payer: Self-pay

## 2023-05-07 ENCOUNTER — Telehealth: Payer: Self-pay | Admitting: Home Health

## 2023-05-07 DIAGNOSIS — E875 Hyperkalemia: Secondary | ICD-10-CM

## 2023-05-07 LAB — BASIC METABOLIC PANEL
BUN/Creatinine Ratio: 23 (ref 12–28)
BUN: 20 mg/dL (ref 8–27)
CO2: 27 mmol/L (ref 20–29)
Calcium: 10 mg/dL (ref 8.7–10.3)
Chloride: 95 mmol/L — ABNORMAL LOW (ref 96–106)
Creatinine, Ser: 0.87 mg/dL (ref 0.57–1.00)
Glucose: 88 mg/dL (ref 70–99)
Potassium: 6 mmol/L (ref 3.5–5.2)
Sodium: 138 mmol/L (ref 134–144)
eGFR: 76 mL/min/{1.73_m2} (ref >59)

## 2023-05-07 NOTE — Telephone Encounter (Signed)
[facility] called after hours reporting patient had critical lab result with K 6 today on BMP. Called the patient, voice mail left for her to call back regarding abnormal lab results. Will forward this to NP [facility]. Please reach out tomorrow again if patient not calling back.

## 2023-05-08 ENCOUNTER — Telehealth: Payer: Self-pay | Admitting: Emergency Medicine

## 2023-05-08 ENCOUNTER — Encounter (HOSPITAL_BASED_OUTPATIENT_CLINIC_OR_DEPARTMENT_OTHER): Payer: Self-pay

## 2023-05-08 ENCOUNTER — Emergency Department (HOSPITAL_COMMUNITY)
Admission: EM | Admit: 2023-05-08 | Discharge: 2023-05-08 | Disposition: A | Discharge status: Home / Self Care | Payer: BC Managed Care – PPO | Attending: Emergency Medicine | Admitting: Emergency Medicine

## 2023-05-08 ENCOUNTER — Ambulatory Visit (HOSPITAL_BASED_OUTPATIENT_CLINIC_OR_DEPARTMENT_OTHER): Payer: BC Managed Care – PPO | Admitting: Family

## 2023-05-08 ENCOUNTER — Encounter (HOSPITAL_COMMUNITY): Payer: Self-pay

## 2023-05-08 DIAGNOSIS — E875 Hyperkalemia: Secondary | ICD-10-CM | POA: Diagnosis not present

## 2023-05-08 LAB — CBC WITH DIFFERENTIAL/PLATELET
Abs Immature Granulocytes: 0.02 10*3/uL (ref 0.00–0.07)
Basophils Absolute: 0.1 10*3/uL (ref 0.0–0.1)
Basophils Relative: 1 %
Eosinophils Absolute: 0.2 10*3/uL (ref 0.0–0.5)
Eosinophils Relative: 3 %
HCT: 44.2 % (ref 36.0–46.0)
Hemoglobin: 15.6 g/dL — ABNORMAL HIGH (ref 12.0–15.0)
Immature Granulocytes: 0 %
Lymphocytes Relative: 15 %
Lymphs Abs: 0.8 10*3/uL (ref 0.7–4.0)
MCH: 38.1 pg — ABNORMAL HIGH (ref 26.0–34.0)
MCHC: 35.3 g/dL (ref 30.0–36.0)
MCV: 108.1 fL — ABNORMAL HIGH (ref 80.0–100.0)
Monocytes Absolute: 0.7 10*3/uL (ref 0.1–1.0)
Monocytes Relative: 13 %
Neutro Abs: 3.9 10*3/uL (ref 1.7–7.7)
Neutrophils Relative %: 68 %
Platelets: 220 10*3/uL (ref 150–400)
RBC: 4.09 MIL/uL (ref 3.87–5.11)
RDW: 14.8 % (ref 11.5–15.5)
WBC: 5.7 10*3/uL (ref 4.0–10.5)
nRBC: 0 % (ref 0.0–0.2)

## 2023-05-08 LAB — BASIC METABOLIC PANEL
Anion gap: 10 (ref 5–15)
BUN: 21 mg/dL — ABNORMAL HIGH (ref 6–20)
CO2: 29 mmol/L (ref 22–32)
Calcium: 9.3 mg/dL (ref 8.9–10.3)
Chloride: 98 mmol/L (ref 98–111)
Creatinine, Ser: 0.85 mg/dL (ref 0.44–1.00)
GFR, Estimated: >60 mL/min (ref >60)
Glucose, Bld: 112 mg/dL — ABNORMAL HIGH (ref 70–99)
Potassium: 4.7 mmol/L (ref 3.5–5.1)
Sodium: 137 mmol/L (ref 135–145)

## 2023-05-08 LAB — MAGNESIUM: Magnesium: 1.5 mg/dL — ABNORMAL LOW (ref 1.7–2.4)

## 2023-05-08 NOTE — Discharge Instructions (Signed)
Your cardiologist is worried about a high potassium level.  Luckily here it is normal.  Please follow-up with them in the office.

## 2023-05-08 NOTE — ED Provider Triage Note (Signed)
 Emergency Medicine Provider Triage Evaluation Note  Gina Cervantes , a 61 y.o. female  was evaluated in triage.  Pt complains of hyperkalemia.  Patient has a history of lung cancer and other malignancies.  Also history of heart failure.  She had a hospitalization recently for diuresis.  She states that she was taken off of Lasix  at that time and has maintained her weight.  Over the past 2 weeks her potassium has incrementally become more elevated.  Yesterday her potassium was 6.0, prompting emergency department visit today.  Patient states that she otherwise feels well, no infectious type symptoms.  She is not on any potassium supplementation.  She did have mild cramps overnight last night.  Review of Systems  Positive: Cramping Negative: Fevers  Physical Exam  BP 139/86   Pulse (!) 102   Temp 98.3 F (36.8 C) (Oral)   Resp 18   Ht 5' 5 (1.651 m)   Wt 64 kg   LMP 04/07/2008   SpO2 92%   BMI 23.46 kg/m  Gen:   Awake, no distress   Resp:  Normal effort  MSK:   Moves extremities without difficulty  Other:  Mild tachycardia noted, no murmurs  Medical Decision Making  Medically screening exam initiated at 9:50 AM.  Appropriate orders placed.  Gina Cervantes was informed that the remainder of the evaluation will be completed by another provider, this initial triage assessment does not replace that evaluation, and the importance of remaining in the ED until their evaluation is complete.  Labs and EKG ordered.   Desiderio Chew, PA-C 05/08/23 [phone]

## 2023-05-08 NOTE — ED Provider Notes (Signed)
 [place] EMERGENCY DEPARTMENT AT [facility] Provider Note   CSN: [id number] Arrival date & time: 05/08/23  9073     History  Chief Complaint  Patient presents with   Abnormal Lab    Gina Cervantes is a 62 y.o. female.  62 yo F with a chief complaint of hyperkalemia.  She has been told in the hospital and has seen her cardiologist multiple times 3 weeks he said to be mildly hyperkalemic hence is gotten frequent blood draws.  Yesterday had a potassium level of 6 and was sent here for evaluation.  She tells me she had some cramping last night denies any other specific symptoms.  Feels like she has been changing her diet to try increase oral potassium intake.   Abnormal Lab      Home Medications Prior to Admission medications   Medication Sig Start Date End Date Taking? Authorizing Provider  albuterol  (VENTOLIN  HFA) 108 (90 Base) MCG/ACT inhaler INHALE 2 PUFFS BY MOUTH EVERY 6 HOURS AS NEEDED FOR WHEEZING OR SHORTNESS OF BREATH Patient taking differently: Inhale 2 puffs into the lungs every 6 (six) hours as needed for wheezing or shortness of breath. 02/19/23   Cloretta Arley NOVAK, MD  Budeson-Glycopyrrol-Formoterol  (BREZTRI  AEROSPHERE) 160-9-4.8 MCG/ACT AERO Inhale 2 puffs into the lungs in the morning and at bedtime. 07/10/22   Brenna Cain L, DO  CALCIUM  PO Take 1 tablet by mouth daily. Dosage unknown    [provider]  carvedilol  (COREG ) 3.125 MG tablet Take 1 tablet (3.125 mg total) by mouth 2 (two) times daily with a meal. 04/19/23   Dunn, Dayna N, PA-C  dapagliflozin  propanediol (FARXIGA ) 10 MG TABS tablet Take 1 tablet (10 mg total) by mouth daily. 04/19/23   Dunn, Dayna N, PA-C  furosemide  (LASIX ) 20 MG tablet Take 1 tablet (20 mg total) by mouth daily as needed (for weight gain of 3 lbs in 1 day or 5 lbs in 1 week). 04/07/23 04/06/24  Tobie Yetta HERO, MD  LORazepam  (ATIVAN ) 0.5 MG tablet Take 1 tablet (0.5 mg total) by mouth every 8 (eight) hours as  needed for anxiety (or nausea). 10/05/22   Cloretta Arley NOVAK, MD      Allergies    Patient has no known allergies.    Review of Systems   Review of Systems  Physical Exam Updated Vital Signs BP 139/86   Pulse (!) 102   Temp 98.3 F (36.8 C) (Oral)   Resp 18   Ht 5' 5 (1.651 m)   Wt 64 kg   LMP 04/07/2008   SpO2 92%   BMI 23.46 kg/m  Physical Exam Vitals and nursing note reviewed.  Constitutional:      General: She is not in acute distress.    Appearance: She is well-developed. She is not diaphoretic.  HENT:     Head: Normocephalic and atraumatic.  Eyes:     Pupils: Pupils are equal, round, and reactive to light.  Cardiovascular:     Rate and Rhythm: Normal rate and regular rhythm.     Heart sounds: No murmur heard.    No friction rub. No gallop.  Pulmonary:     Effort: Pulmonary effort is normal.     Breath sounds: No wheezing or rales.  Abdominal:     General: There is no distension.     Palpations: Abdomen is soft.     Tenderness: There is no abdominal tenderness.  Musculoskeletal:        General:  No tenderness.     Cervical back: Normal range of motion and neck supple.  Skin:    General: Skin is warm and dry.  Neurological:     Mental Status: She is alert and oriented to person, place, and time.  Psychiatric:        Behavior: Behavior normal.     ED Results / Procedures / Treatments   Labs (all labs ordered are listed, but only abnormal results are displayed) Labs Reviewed  CBC WITH DIFFERENTIAL/PLATELET - Abnormal; Notable for the following components:      Result Value   Hemoglobin 15.6 (*)    MCV 108.1 (*)    MCH 38.1 (*)    All other components within normal limits  BASIC METABOLIC PANEL - Abnormal; Notable for the following components:   Glucose, Bld 112 (*)    BUN 21 (*)    All other components within normal limits  MAGNESIUM  - Abnormal; Notable for the following components:   Magnesium  1.5 (*)    All other components within normal limits     EKG EKG Interpretation Date/Time:  Tuesday May 08 2023 10:05:20 EST Ventricular Rate:  90 PR Interval:  137 QRS Duration:  90 QT Interval:  392 QTC Calculation: 480 R Axis:   104  Text Interpretation: Sinus rhythm Probable RVH w/ secondary repol abnormality No significant change since last tracing Confirmed by Emil Share [phone]) on 05/08/2023 11:27:59 AM  Radiology No results found.  Procedures Procedures    Medications Ordered in ED Medications - No data to display  ED Course/ Medical Decision Making/ A&P                                 Medical Decision Making  62 yo F with a chief complaint of hypokalemia.  She has been having her potassium closely checked by her cardiologist.  6 yesterday.  Sent here for evaluation.  She has been cramping otherwise denies any symptoms.  At 1 point she had one of her medications held but currently is taking all of her medicines.  Has been trying to mediate patient seen by me.  Potassium here is 4.7.  Will have her counseled on her low potassium diet to the Follow up with cardiology.   12:32 PM:  I have discussed the diagnosis/risks/treatment options with the patient.  Evaluation and diagnostic testing in the emergency department does not suggest an emergent condition requiring admission or immediate intervention beyond what has been performed at this time.  They will follow up with PCP. We also discussed returning to the ED immediately if new or worsening sx occur. We discussed the sx which are most concerning (e.g., sudden worsening pain, fever, inability to tolerate by mouth) that necessitate immediate return. Medications administered to the patient during their visit and any new prescriptions provided to the patient are listed below.  Medications given during this visit Medications - No data to display   The patient appears reasonably screen and/or stabilized for discharge and I doubt any other medical condition or other [facility]  requiring further screening, evaluation, or treatment in the ED at this time prior to discharge.          Final Clinical Impression(s) / ED Diagnoses Final diagnoses:  Hyperkalemia    Rx / DC Orders ED Discharge Orders     None         Emil Share, DO 05/08/23 1232

## 2023-05-08 NOTE — ED Triage Notes (Signed)
 Pt presents with c/o abnormal potassium level. Pt reports she had lab work done yesterday and was told that her potassium was over 6 and she needed to have it checked out. Pt also reports that she did have some mild muscle cramps in the middle of night. Pt denies any other symptoms at this time, does report that she has a hx of congestive heart failure.

## 2023-05-08 NOTE — Telephone Encounter (Signed)
 Patient called this morning in regards to a critical potassium level of 6. She has had ongoing hyperkalemia (5.3, 5.7, 5.7, 6) . I have advised patient to be evaluated in the ER. Pt notified to head to [facility] ER at this time.

## 2023-05-10 ENCOUNTER — Other Ambulatory Visit (HOSPITAL_COMMUNITY)
Admission: RE | Admit: 2023-05-10 | Discharge: 2023-05-10 | Disposition: A | Discharge status: Home / Self Care | Payer: BC Managed Care – PPO | Source: Ambulatory Visit | Attending: Obstetrics & Gynecology | Admitting: Obstetrics & Gynecology

## 2023-05-10 ENCOUNTER — Encounter (HOSPITAL_BASED_OUTPATIENT_CLINIC_OR_DEPARTMENT_OTHER): Payer: Self-pay | Admitting: Obstetrics & Gynecology

## 2023-05-10 ENCOUNTER — Ambulatory Visit (HOSPITAL_BASED_OUTPATIENT_CLINIC_OR_DEPARTMENT_OTHER): Payer: BC Managed Care – PPO | Admitting: Obstetrics & Gynecology

## 2023-05-10 VITALS — BP 118/77 | HR 95 | Wt 143.8 lb

## 2023-05-10 DIAGNOSIS — B977 Papillomavirus as the cause of diseases classified elsewhere: Secondary | ICD-10-CM

## 2023-05-10 DIAGNOSIS — Z853 Personal history of malignant neoplasm of breast: Secondary | ICD-10-CM | POA: Diagnosis not present

## 2023-05-10 DIAGNOSIS — Z8679 Personal history of other diseases of the circulatory system: Secondary | ICD-10-CM

## 2023-05-10 DIAGNOSIS — Z1151 Encounter for screening for human papillomavirus (HPV): Secondary | ICD-10-CM | POA: Diagnosis not present

## 2023-05-10 DIAGNOSIS — Z124 Encounter for screening for malignant neoplasm of cervix: Secondary | ICD-10-CM | POA: Diagnosis not present

## 2023-05-10 DIAGNOSIS — Z01419 Encounter for gynecological examination (general) (routine) without abnormal findings: Secondary | ICD-10-CM | POA: Diagnosis not present

## 2023-05-10 DIAGNOSIS — Z85048 Personal history of other malignant neoplasm of rectum, rectosigmoid junction, and anus: Secondary | ICD-10-CM | POA: Diagnosis not present

## 2023-05-10 DIAGNOSIS — Z780 Asymptomatic menopausal state: Secondary | ICD-10-CM | POA: Diagnosis not present

## 2023-05-10 DIAGNOSIS — Z13820 Encounter for screening for osteoporosis: Secondary | ICD-10-CM

## 2023-05-10 DIAGNOSIS — N72 Inflammatory disease of cervix uteri: Secondary | ICD-10-CM

## 2023-05-10 DIAGNOSIS — C3412 Malignant neoplasm of upper lobe, left bronchus or lung: Secondary | ICD-10-CM

## 2023-05-10 NOTE — Progress Notes (Signed)
 63 y.o. [id number] Married White or Caucasian female here for annual exam.  Was hospitalized for hypoxia in November.  She has developed some heart failure from lung changes due to chemotherapy and radiation in the past.    Last appt with Dr. Cloretta was November.  Followed as well by Dr. Ladona, cardiology.  Denies vaginal bleeding.    Patient's last menstrual period was 04/07/2008.          Sexually active: Yes.    The current method of family planning is post menopausal status.    Smoker:  no  Health Maintenance: Pap:  04/24/2022 (+) HPV History of abnormal Pap:  yes MMG:  04/13/2023 Negative Colonoscopy:  09/21/2021 BMD:   2014 Screening Labs: lipid ordered   reports that she quit smoking about 10 years ago. Her smoking use included cigarettes. She started smoking about 18 years ago. She has never used smokeless tobacco. She reports that she does not currently use alcohol. She reports that she does not use drugs.  Past Medical History:  Diagnosis Date   Anal cancer (HCC) 08/09/2012   Squamous cell   Anxiety    PANIC ATTACKS   Arthritis    Breast cancer (HCC) 2009   ER+PR+HER-2+   Ductal carcinoma (HCC) 02/2008   invasive    Heart palpitations    History of radiation therapy 09/02/12-10/10/12   anal 50.4Gy   History of shingles 10/2014   HPV (human papilloma virus) anogenital infection    vulvar/ freezing hx   Hx of radiation therapy 2020   Lung nodule    Malignant neoplasm of upper lobe of left lung (HCC) 06/2016   Personal history of chemotherapy    Personal history of radiation therapy 2010   Pneumonia    NO RECENT PROBLEMS   PONV (postoperative nausea and vomiting)    Hypotension   Radiation 10/21/08-12/08/08   Right breast 6240 cGy   Shingles 2017   Status post chemotherapy 02/2008   Taxol/Herceptin    Past Surgical History:  Procedure Laterality Date   BREAST BIOPSY Right 02/04/2008   malignant   BREAST LUMPECTOMY Right 2010   malignant   BREAST LUMPECTOMY  WITH AXILLARY LYMPH NODE BIOPSY Right 5/10   Dr. Curvin   BRONCHIAL BIOPSY  12/28/2020   Procedure: BRONCHIAL BIOPSIES;  Surgeon: Brenna Adine CROME, DO;  Location: [facility];  Service: Pulmonary;;   BRONCHIAL BRUSHINGS  12/28/2020   Procedure: BRONCHIAL BRUSHINGS;  Surgeon: Brenna Adine CROME, DO;  Location: [facility];  Service: Pulmonary;;   BRONCHIAL NEEDLE ASPIRATION BIOPSY  12/28/2020   Procedure: BRONCHIAL NEEDLE ASPIRATION BIOPSIES;  Surgeon: Brenna Adine CROME, DO;  Location: [facility];  Service: Pulmonary;;   BRONCHIAL WASHINGS  12/28/2020   Procedure: BRONCHIAL WASHINGS;  Surgeon: Brenna Adine CROME, DO;  Location: [facility];  Service: Pulmonary;;   CESAREAN SECTION     COLONOSCOPY W/ POLYPECTOMY     ENDOBRONCHIAL ULTRASOUND  12/28/2020   Procedure: ENDOBRONCHIAL ULTRASOUND;  Surgeon: Brenna Adine CROME, DO;  Location: [facility];  Service: Pulmonary;;   EVALUATION UNDER ANESTHESIA WITH ANAL FISTULECTOMY N/A 08/09/2012   Procedure: EXAM UNDER ANESTHESIA AND BIOPSY OF ANAL MASS;  Surgeon: Krystal JINNY Russell, MD;  Location: [facility];  Service: General;  Laterality: N/A;   IR IMAGING GUIDED PORT INSERTION  01/12/2021   IR RADIOLOGIST EVAL & MGMT  03/11/2021   IR REMOVAL TUN ACCESS W/ PORT W/O FL MOD SED  02/24/2021   KNEE ARTHROSCOPY Left    left knee  LUNG LOBECTOMY Left    PORTACATH PLACEMENT  03/2008   dr. curvin    REMOVAL PORTACATH  2011   RIGHT/LEFT HEART CATH AND CORONARY ANGIOGRAPHY N/A 04/06/2023   Procedure: RIGHT/LEFT HEART CATH AND CORONARY ANGIOGRAPHY;  Surgeon: Ladona Heinz, MD;  Location: [facility];  Service: Cardiovascular;  Laterality: N/A;   VIDEO ASSISTED THORACOSCOPY (VATS)/WEDGE RESECTION Left 07/05/2016   Procedure: VIDEO ASSISTED THORACOSCOPY (VATS)/WEDGE RESECTION left upper lobe,  lymph node dissection and placement of OnQ catheter;  Surgeon: Dallas KATHEE Jude, MD;  Location: [facility] OR;  Service: Thoracic;  Laterality: Left;   VIDEO BRONCHOSCOPY N/A 07/05/2016    Procedure: VIDEO BRONCHOSCOPY;  Surgeon: Dallas KATHEE Jude, MD;  Location: [facility] OR;  Service: Thoracic;  Laterality: N/A;   VIDEO BRONCHOSCOPY N/A 05/03/2018   Procedure: VIDEO BRONCHOSCOPY;  Surgeon: Jude Dallas KATHEE, MD;  Location: [facility] OR;  Service: Thoracic;  Laterality: N/A;   VIDEO BRONCHOSCOPY WITH ENDOBRONCHIAL NAVIGATION N/A 05/03/2018   Procedure: VIDEO BRONCHOSCOPY WITH ENDOBRONCHIAL NAVIGATION WITH BIOPSY;  Surgeon: Jude Dallas KATHEE, MD;  Location: [facility] OR;  Service: Thoracic;  Laterality: N/A;   VIDEO BRONCHOSCOPY WITH ENDOBRONCHIAL NAVIGATION Bilateral 12/28/2020   Procedure: VIDEO BRONCHOSCOPY WITH ENDOBRONCHIAL NAVIGATION;  Surgeon: Brenna Adine CROME, DO;  Location: [facility];  Service: Pulmonary;  Laterality: Bilateral;  ION   VIDEO BRONCHOSCOPY WITH ENDOBRONCHIAL ULTRASOUND N/A 05/03/2018   Procedure: VIDEO BRONCHOSCOPY WITH ENDOBRONCHIAL ULTRASOUND;  Surgeon: Jude Dallas KATHEE, MD;  Location: [facility] OR;  Service: Thoracic;  Laterality: N/A;   VIDEO BRONCHOSCOPY WITH RADIAL ENDOBRONCHIAL ULTRASOUND  12/28/2020   Procedure: RADIAL ENDOBRONCHIAL ULTRASOUND;  Surgeon: Brenna Adine CROME, DO;  Location: [facility];  Service: Pulmonary;;    Current Outpatient Medications  Medication Sig Dispense Refill   albuterol  (VENTOLIN  HFA) 108 (90 Base) MCG/ACT inhaler INHALE 2 PUFFS BY MOUTH EVERY 6 HOURS AS NEEDED FOR WHEEZING OR SHORTNESS OF BREATH (Patient taking differently: Inhale 2 puffs into the lungs every 6 (six) hours as needed for wheezing or shortness of breath.) 8.5 g 1   Budeson-Glycopyrrol-Formoterol  (BREZTRI  AEROSPHERE) 160-9-4.8 MCG/ACT AERO Inhale 2 puffs into the lungs in the morning and at bedtime. 10.7 g 6   CALCIUM  PO Take 1 tablet by mouth daily. Dosage unknown     carvedilol  (COREG ) 3.125 MG tablet Take 1 tablet (3.125 mg total) by mouth 2 (two) times daily with a meal. 180 tablet 3   dapagliflozin  propanediol (FARXIGA ) 10 MG TABS tablet Take 1 tablet (10 mg total) by mouth  daily. 90 tablet 3   furosemide  (LASIX ) 20 MG tablet Take 1 tablet (20 mg total) by mouth daily as needed (for weight gain of 3 lbs in 1 day or 5 lbs in 1 week). 30 tablet 0   LORazepam  (ATIVAN ) 0.5 MG tablet Take 1 tablet (0.5 mg total) by mouth every 8 (eight) hours as needed for anxiety (or nausea). 60 tablet 0   No current facility-administered medications for this visit.    Family History  Problem Relation Age of Onset   Lung cancer Father    Stroke Mother    Breast cancer Neg Hx     ROS: Constitutional: negative Genitourinary:negative  Exam:   BP 118/77 (BP Location: Left Arm, Patient Position: Sitting, Cuff Size: Normal)   Pulse 95   Wt 143 lb 12.8 oz (65.2 kg)   LMP 04/07/2008   BMI 23.93 kg/m      General appearance: alert, cooperative and appears stated age Head: Normocephalic, without obvious abnormality, atraumatic  Neck: no adenopathy, supple, symmetrical, trachea midline and thyroid  normal to inspection and palpation Lungs: clear to auscultation bilaterally Breasts: normal appearance, no masses or tenderness, well healed port incision above right breast Heart: regular rate and rhythm Abdomen: soft, non-tender; bowel sounds normal; no masses,  no organomegaly Extremities: extremities normal, atraumatic, no cyanosis or edema Skin: Skin color, texture, turgor normal. No rashes or lesions Lymph nodes: Cervical, supraclavicular, and axillary nodes normal. No abnormal inguinal nodes palpated Neurologic: Grossly normal   Pelvic: External genitalia:  no lesions              Urethra:  normal appearing urethra with no masses, tenderness or lesions              Bartholins and Skenes: normal                 Vagina: stable radiation changes, no discharge, no lesions              Cervix: no lesions              Pap taken: Yes.   Bimanual Exam:  Uterus:  normal size, contour, position, consistency, mobility, non-tender              Adnexa: normal adnexa and no mass,  fullness, tenderness               Rectovaginal: Confirms               Anus:  normal sphincter tone, no lesions but stable radiation changes  Chaperone, Bascom Kotyk, CMA, was present for exam.  Assessment/Plan: 1. Well woman exam with routine gynecological exam (Primary) - Pap smear and HR HPV obtained today - Mammogram 04/13/2023 - Colonoscopy 2023 - Bone mineral density ordered - lab work ordered - vaccines reviewed/updated  2. High risk HPV infection - Cytology - PAP( [facility])  3. Cervical cancer screening  4. History of right breast cancer  5. History of anal cancer  6. Malignant neoplasm of upper lobe of left lung (HCC) - followed by Dr. Deanne  7. History of heart failure - Lipid panel; Future  8. Osteoporosis screening - DG Bone Density; Future

## 2023-05-14 ENCOUNTER — Encounter: Payer: Self-pay | Admitting: Pulmonary Disease

## 2023-05-14 ENCOUNTER — Other Ambulatory Visit: Payer: Self-pay | Admitting: Emergency Medicine

## 2023-05-16 LAB — CYTOLOGY - PAP
Comment: NEGATIVE
Comment: NEGATIVE
Comment: NEGATIVE
Diagnosis: UNDETERMINED — AB
HPV 16: NEGATIVE
HPV 18 / 45: NEGATIVE
High risk HPV: POSITIVE — AB

## 2023-05-17 ENCOUNTER — Other Ambulatory Visit: Payer: Self-pay

## 2023-05-17 ENCOUNTER — Encounter (HOSPITAL_COMMUNITY): Payer: Self-pay | Admitting: Internal Medicine

## 2023-05-17 ENCOUNTER — Emergency Department (HOSPITAL_COMMUNITY): Payer: BC Managed Care – PPO

## 2023-05-17 ENCOUNTER — Inpatient Hospital Stay (HOSPITAL_COMMUNITY)
Admission: EM | Admit: 2023-05-17 | Discharge: 2023-05-19 | DRG: 202 | Disposition: A | Discharge status: Home / Self Care | Payer: BC Managed Care – PPO | Attending: Internal Medicine | Admitting: Internal Medicine

## 2023-05-17 DIAGNOSIS — J9601 Acute respiratory failure with hypoxia: Secondary | ICD-10-CM | POA: Diagnosis present

## 2023-05-17 DIAGNOSIS — J210 Acute bronchiolitis due to respiratory syncytial virus: Principal | ICD-10-CM | POA: Diagnosis present

## 2023-05-17 DIAGNOSIS — Z1152 Encounter for screening for COVID-19: Secondary | ICD-10-CM

## 2023-05-17 DIAGNOSIS — I2510 Atherosclerotic heart disease of native coronary artery without angina pectoris: Secondary | ICD-10-CM | POA: Diagnosis present

## 2023-05-17 DIAGNOSIS — J441 Chronic obstructive pulmonary disease with (acute) exacerbation: Secondary | ICD-10-CM | POA: Diagnosis present

## 2023-05-17 DIAGNOSIS — Z85118 Personal history of other malignant neoplasm of bronchus and lung: Secondary | ICD-10-CM

## 2023-05-17 DIAGNOSIS — R918 Other nonspecific abnormal finding of lung field: Secondary | ICD-10-CM | POA: Diagnosis present

## 2023-05-17 DIAGNOSIS — Z923 Personal history of irradiation: Secondary | ICD-10-CM | POA: Diagnosis not present

## 2023-05-17 DIAGNOSIS — Z801 Family history of malignant neoplasm of trachea, bronchus and lung: Secondary | ICD-10-CM

## 2023-05-17 DIAGNOSIS — C3490 Malignant neoplasm of unspecified part of unspecified bronchus or lung: Secondary | ICD-10-CM

## 2023-05-17 DIAGNOSIS — F419 Anxiety disorder, unspecified: Secondary | ICD-10-CM | POA: Diagnosis not present

## 2023-05-17 DIAGNOSIS — I700 Atherosclerosis of aorta: Secondary | ICD-10-CM | POA: Diagnosis not present

## 2023-05-17 DIAGNOSIS — R0602 Shortness of breath: Secondary | ICD-10-CM | POA: Diagnosis not present

## 2023-05-17 DIAGNOSIS — Z87891 Personal history of nicotine dependence: Secondary | ICD-10-CM | POA: Diagnosis not present

## 2023-05-17 DIAGNOSIS — R590 Localized enlarged lymph nodes: Secondary | ICD-10-CM | POA: Diagnosis not present

## 2023-05-17 DIAGNOSIS — Z853 Personal history of malignant neoplasm of breast: Secondary | ICD-10-CM

## 2023-05-17 DIAGNOSIS — I5082 Biventricular heart failure: Secondary | ICD-10-CM | POA: Diagnosis not present

## 2023-05-17 DIAGNOSIS — E875 Hyperkalemia: Secondary | ICD-10-CM | POA: Diagnosis present

## 2023-05-17 DIAGNOSIS — J440 Chronic obstructive pulmonary disease with acute lower respiratory infection: Secondary | ICD-10-CM | POA: Diagnosis not present

## 2023-05-17 DIAGNOSIS — I5042 Chronic combined systolic (congestive) and diastolic (congestive) heart failure: Secondary | ICD-10-CM | POA: Diagnosis not present

## 2023-05-17 DIAGNOSIS — D630 Anemia in neoplastic disease: Secondary | ICD-10-CM | POA: Diagnosis present

## 2023-05-17 DIAGNOSIS — C50911 Malignant neoplasm of unspecified site of right female breast: Secondary | ICD-10-CM | POA: Diagnosis present

## 2023-05-17 DIAGNOSIS — J4489 Other specified chronic obstructive pulmonary disease: Secondary | ICD-10-CM | POA: Diagnosis present

## 2023-05-17 DIAGNOSIS — C3410 Malignant neoplasm of upper lobe, unspecified bronchus or lung: Secondary | ICD-10-CM | POA: Diagnosis present

## 2023-05-17 DIAGNOSIS — C210 Malignant neoplasm of anus, unspecified: Secondary | ICD-10-CM | POA: Diagnosis present

## 2023-05-17 DIAGNOSIS — B338 Other specified viral diseases: Secondary | ICD-10-CM | POA: Insufficient documentation

## 2023-05-17 DIAGNOSIS — Z85048 Personal history of other malignant neoplasm of rectum, rectosigmoid junction, and anus: Secondary | ICD-10-CM

## 2023-05-17 LAB — CBC WITH DIFFERENTIAL/PLATELET
Abs Immature Granulocytes: 0.03 10*3/uL (ref 0.00–0.07)
Basophils Absolute: 0.1 10*3/uL (ref 0.0–0.1)
Basophils Relative: 1 %
Eosinophils Absolute: 0.1 10*3/uL (ref 0.0–0.5)
Eosinophils Relative: 1 %
HCT: 46.3 % — ABNORMAL HIGH (ref 36.0–46.0)
Hemoglobin: 15.8 g/dL — ABNORMAL HIGH (ref 12.0–15.0)
Immature Granulocytes: 0 %
Lymphocytes Relative: 5 %
Lymphs Abs: 0.4 10*3/uL — ABNORMAL LOW (ref 0.7–4.0)
MCH: 37.6 pg — ABNORMAL HIGH (ref 26.0–34.0)
MCHC: 34.1 g/dL (ref 30.0–36.0)
MCV: 110.2 fL — ABNORMAL HIGH (ref 80.0–100.0)
Monocytes Absolute: 0.8 10*3/uL (ref 0.1–1.0)
Monocytes Relative: 11 %
Neutro Abs: 6.2 10*3/uL (ref 1.7–7.7)
Neutrophils Relative %: 82 %
Platelets: 237 10*3/uL (ref 150–400)
RBC: 4.2 MIL/uL (ref 3.87–5.11)
RDW: 14.4 % (ref 11.5–15.5)
WBC: 7.6 10*3/uL (ref 4.0–10.5)
nRBC: 0 % (ref 0.0–0.2)

## 2023-05-17 LAB — RESP PANEL BY RT-PCR (RSV, FLU A&B, COVID)  RVPGX2
Influenza A by PCR: NEGATIVE
Influenza B by PCR: NEGATIVE
Resp Syncytial Virus by PCR: POSITIVE — AB
SARS Coronavirus 2 by RT PCR: NEGATIVE

## 2023-05-17 LAB — EXPECTORATED SPUTUM ASSESSMENT W GRAM STAIN, RFLX TO RESP C

## 2023-05-17 LAB — BASIC METABOLIC PANEL WITH GFR
Anion gap: 11 (ref 5–15)
BUN: 16 mg/dL (ref 6–20)
CO2: 28 mmol/L (ref 22–32)
Calcium: 9 mg/dL (ref 8.9–10.3)
Chloride: 94 mmol/L — ABNORMAL LOW (ref 98–111)
Creatinine, Ser: 0.72 mg/dL (ref 0.44–1.00)
GFR, Estimated: >60 mL/min
Glucose, Bld: 105 mg/dL — ABNORMAL HIGH (ref 70–99)
Potassium: 4.7 mmol/L (ref 3.5–5.1)
Sodium: 133 mmol/L — ABNORMAL LOW (ref 135–145)

## 2023-05-17 LAB — I-STAT CG4 LACTIC ACID, ED: Lactic Acid, Venous: 1.4 mmol/L (ref 0.5–1.9)

## 2023-05-17 LAB — MRSA NEXT GEN BY PCR, NASAL: MRSA by PCR Next Gen: NOT DETECTED

## 2023-05-17 LAB — BRAIN NATRIURETIC PEPTIDE: B Natriuretic Peptide: 107.1 pg/mL — ABNORMAL HIGH (ref 0.0–100.0)

## 2023-05-17 LAB — PROCALCITONIN: Procalcitonin: <0.1 ng/mL

## 2023-05-17 MED ORDER — IPRATROPIUM-ALBUTEROL 0.5-2.5 (3) MG/3ML IN SOLN
3.0000 mL | Freq: Once | RESPIRATORY_TRACT | Status: AC
  Administered 2023-05-17: 3 mL via RESPIRATORY_TRACT
  Filled 2023-05-17: qty 3

## 2023-05-17 MED ORDER — METHYLPREDNISOLONE SODIUM SUCC 125 MG IJ SOLR
125.0000 mg | Freq: Once | INTRAMUSCULAR | Status: AC
  Administered 2023-05-17: 125 mg via INTRAVENOUS
  Filled 2023-05-17: qty 2

## 2023-05-17 MED ORDER — FUROSEMIDE 10 MG/ML IJ SOLN
40.0000 mg | Freq: Every day | INTRAMUSCULAR | Status: DC
  Administered 2023-05-17 – 2023-05-19 (×3): 40 mg via INTRAVENOUS
  Filled 2023-05-17 (×3): qty 4

## 2023-05-17 MED ORDER — METHYLPREDNISOLONE SODIUM SUCC 125 MG IJ SOLR
60.0000 mg | INTRAMUSCULAR | Status: DC
  Administered 2023-05-18 – 2023-05-19 (×2): 60 mg via INTRAVENOUS
  Filled 2023-05-17 (×2): qty 2

## 2023-05-17 MED ORDER — SODIUM CHLORIDE 0.9 % IV SOLN
500.0000 mg | Freq: Every day | INTRAVENOUS | Status: DC
  Administered 2023-05-18: 500 mg via INTRAVENOUS
  Filled 2023-05-17: qty 5

## 2023-05-17 MED ORDER — ENOXAPARIN SODIUM 40 MG/0.4ML IJ SOSY
40.0000 mg | PREFILLED_SYRINGE | INTRAMUSCULAR | Status: DC
  Administered 2023-05-17 – 2023-05-18 (×2): 40 mg via SUBCUTANEOUS
  Filled 2023-05-17 (×2): qty 0.4

## 2023-05-17 MED ORDER — UMECLIDINIUM BROMIDE 62.5 MCG/ACT IN AEPB
1.0000 | INHALATION_SPRAY | Freq: Every day | RESPIRATORY_TRACT | Status: DC
  Filled 2023-05-17: qty 7

## 2023-05-17 MED ORDER — ONDANSETRON HCL 4 MG/2ML IJ SOLN: 4.0000 mg | Freq: Four times a day (QID) | INTRAMUSCULAR | Status: DC | PRN

## 2023-05-17 MED ORDER — ACETAMINOPHEN 650 MG RE SUPP: 650.0000 mg | Freq: Four times a day (QID) | RECTAL | Status: DC | PRN

## 2023-05-17 MED ORDER — MOMETASONE FURO-FORMOTEROL FUM 100-5 MCG/ACT IN AERO
2.0000 | INHALATION_SPRAY | Freq: Two times a day (BID) | RESPIRATORY_TRACT | Status: DC
  Filled 2023-05-17: qty 8.8

## 2023-05-17 MED ORDER — HYDRALAZINE HCL 20 MG/ML IJ SOLN: 5.0000 mg | Freq: Four times a day (QID) | INTRAMUSCULAR | Status: DC | PRN

## 2023-05-17 MED ORDER — GUAIFENESIN 100 MG/5ML PO LIQD
5.0000 mL | ORAL | Status: DC | PRN
  Administered 2023-05-17 – 2023-05-19 (×6): 5 mL via ORAL
  Filled 2023-05-17 (×6): qty 10

## 2023-05-17 MED ORDER — ACETAMINOPHEN 325 MG PO TABS
650.0000 mg | ORAL_TABLET | Freq: Once | ORAL | Status: AC
  Administered 2023-05-17: 650 mg via ORAL
  Filled 2023-05-17: qty 2

## 2023-05-17 MED ORDER — DAPAGLIFLOZIN PROPANEDIOL 10 MG PO TABS
10.0000 mg | ORAL_TABLET | Freq: Every day | ORAL | Status: DC
  Administered 2023-05-19: 10 mg via ORAL
  Filled 2023-05-17 (×2): qty 1

## 2023-05-17 MED ORDER — SODIUM CHLORIDE 0.9 % IV SOLN
2.0000 g | Freq: Every day | INTRAVENOUS | Status: DC
  Administered 2023-05-18: 2 g via INTRAVENOUS
  Filled 2023-05-17: qty 20

## 2023-05-17 MED ORDER — SODIUM CHLORIDE 0.9% FLUSH
3.0000 mL | INTRAVENOUS | Status: DC | PRN
Start: 2023-05-17 — End: 2023-05-19
  Administered 2023-05-17: 3 mL via INTRAVENOUS

## 2023-05-17 MED ORDER — SODIUM CHLORIDE 0.9 % IV SOLN
1.0000 g | Freq: Once | INTRAVENOUS | Status: AC
  Administered 2023-05-17: 1 g via INTRAVENOUS
  Filled 2023-05-17: qty 10

## 2023-05-17 MED ORDER — SODIUM CHLORIDE 0.9 % IV SOLN: 250.0000 mL | INTRAVENOUS | Status: AC | PRN

## 2023-05-17 MED ORDER — HYDROCODONE-ACETAMINOPHEN 5-325 MG PO TABS
1.0000 | ORAL_TABLET | ORAL | Status: DC | PRN
  Administered 2023-05-18 – 2023-05-19 (×2): 1 via ORAL
  Filled 2023-05-17 (×2): qty 1

## 2023-05-17 MED ORDER — IPRATROPIUM-ALBUTEROL 0.5-2.5 (3) MG/3ML IN SOLN: 3.0000 mL | RESPIRATORY_TRACT | Status: DC | PRN

## 2023-05-17 MED ORDER — ACETAMINOPHEN 325 MG PO TABS
650.0000 mg | ORAL_TABLET | Freq: Four times a day (QID) | ORAL | Status: DC | PRN
  Administered 2023-05-17: 650 mg via ORAL
  Filled 2023-05-17: qty 2

## 2023-05-17 MED ORDER — IOHEXOL 350 MG/ML SOLN
75.0000 mL | Freq: Once | INTRAVENOUS | Status: AC | PRN
  Administered 2023-05-17: 75 mL via INTRAVENOUS

## 2023-05-17 MED ORDER — POLYETHYLENE GLYCOL 3350 17 G PO PACK: 17.0000 g | PACK | Freq: Every day | ORAL | Status: DC | PRN

## 2023-05-17 MED ORDER — ONDANSETRON HCL 4 MG PO TABS: 4.0000 mg | ORAL_TABLET | Freq: Four times a day (QID) | ORAL | Status: DC | PRN

## 2023-05-17 MED ORDER — SODIUM CHLORIDE 0.9% FLUSH
3.0000 mL | Freq: Two times a day (BID) | INTRAVENOUS | Status: DC
  Administered 2023-05-17 – 2023-05-19 (×5): 3 mL via INTRAVENOUS

## 2023-05-17 MED ORDER — BUDESON-GLYCOPYRROL-FORMOTEROL 160-9-4.8 MCG/ACT IN AERO: 2.0000 | INHALATION_SPRAY | Freq: Two times a day (BID) | RESPIRATORY_TRACT | Status: DC

## 2023-05-17 MED ORDER — SODIUM CHLORIDE 0.9 % IV SOLN
500.0000 mg | Freq: Once | INTRAVENOUS | Status: AC
  Administered 2023-05-17: 500 mg via INTRAVENOUS
  Filled 2023-05-17: qty 5

## 2023-05-17 MED ORDER — MOMETASONE FURO-FORMOTEROL FUM 100-5 MCG/ACT IN AERO
2.0000 | INHALATION_SPRAY | Freq: Two times a day (BID) | RESPIRATORY_TRACT | Status: DC
Start: 2023-05-17 — End: 2023-05-17
  Filled 2023-05-17: qty 8.8

## 2023-05-17 MED ORDER — LORAZEPAM 0.5 MG PO TABS
0.5000 mg | ORAL_TABLET | Freq: Three times a day (TID) | ORAL | Status: DC | PRN
Start: 2023-05-17 — End: 2023-05-19
  Administered 2023-05-18: 0.5 mg via ORAL
  Filled 2023-05-17: qty 1

## 2023-05-17 NOTE — H&P (Signed)
 [facility] History and Physical  Gina Cervantes FMW:[id number] DOB: 05/12/1960 DOA: 05/17/2023  Referring physician: ED  PCP: Arloa Elsie [name], MD   Patient is coming from: Home  Chief Complaint: Shortness of breath  HPI:  Patient is a 63 years old female with past medical history of lung cancer/anal cancer breast cancer not on chemo or radiation currently with history of congestive heart failure and pulmonary hypertension, COPD anemia and anxiety presented to hospital with cough and shortness of breath for last few days worsening for last 1 day.  Patient stated that she had pulse ox in the upper 60s and low 70s at home.  EMS was called and brought into the hospital.  Of note patient was recently discharged 04/07/2023 after being treated for hypoxic respiratory failure with heart failure.  Patient reported that she did have some scratchy throat and some sinus congestion in runny nose prior to this and this morning she was very short of breath and dyspneic to the point that she had to resort her to oxygen  that she had not been using recently.  Patient denied any obvious productive sputum, fever, chills or rigor.  Denies any sick contacts.  She lives with her husband at home.  Denies any nausea, vomiting, abdominal pain.  Denies any urinary urgency, frequency or dysuria.  Denies any changes in her bowel habits.  In triage, patient was noted to have pulse ox of 68% on room air and required 6 L of oxygen  to keep saturation in the mid 90s.  Blood pressure was elevated and patient was tachycardic.  Temperature max of 99.8 F.  Initial labs showed no leukocytosis, hemoglobin 15.8 with MCV elevated at 110.  Respiratory panel was positive for RSV.  BNP was elevated at 107.  Sodium slightly low at 133 with creatinine of 0.7.  Lactate was within normal range at 1.4.  Chest x-ray done in the ED showed no new consolidation or interval changes but abnormal chest x-ray.  CT angiogram of the chest was then  performed was negative for pulmonary embolism with bilateral lung abnormality.  Possibility of endobronchial spread of tumor/developing bronchopneumonia but no definite consolidation reported.  Patient received Rocephin  and Zithromax  and IV Solu-Medrol  in the ED including DuoNeb, blood cultures were drawn and patient was considered for admission to the hospital for further evaluation and treatment.  Assessment and Plan Principal Problem:   Acute respiratory failure with hypoxia (HCC) Active Problems:   Breast cancer, right (HCC)   Anal cancer (HCC)   Lung cancer, left  upper lobe (HCC)   Lung nodules   COPD with asthma (HCC)   RSV infection   Acute hypoxic respiratory failure secondary to RSV infection with COPD exacerbation.SABRA History of lung cancer. History of pulmonary hypertension.  Possible underlying bronchopneumonia.  No definite consolidation but possibility of bronchopneumonia.  Received Rocephin  and Zithromax , will continue for now.  Check procalcitonin.  Continue with oxygen  supplementation, bronchodilators, incentive spirometry, supportive care.  Continue IV steroids, will need follow-up with pulmonary as outpatient.  Will need oxygen  on discharge.  Currently on 6 L of oxygen .  Will continue to wean oxygen  as able.  Chronic systolic/diastolic congestive heart failure, mild pulmonary hypertension. Review of 2D echocardiogram from 04/02/2023 with LV ejection fraction of 45% with grade 1 diastolic dysfunction.  Cardiac catheterization done at that time showed mild coronary calcification with mild pulmonary hypertension.  Patient was recommended to be started on Farxiga , Entresto , low-dose statin, Lasix  20 mg and Coreg .  Patient  currently not taking Entresto  due to hyperkalemia.  Was supposed to take Lasix  at home but was not taking it since he was not needing it.  Will continue with IV Lasix  40 milligram daily.  Restart Farxiga .  Follows up with Dr. Brenna pulmonary as  outpatient.  History of COPD pulmonary hypertension with mild COPD exacerbation.   Question pulmonary fibrosis from radiation..   Quit smoking 2009.  Continue with oxygen  nebulizer steroids.  Will need to follow-up with pulmonary as outpatient.  Follows up with Dr. Willye as outpatient.  History of anal cancer breast cancer lung cancer.  Follow-up with outpatient oncology Dr Deanne as outpatient..  Anxiety.  On Ativan  0.5 every 8 hourly.  Will continue.  Chronic anemia.  Likely secondary to anemia of malignancy.  Monitor CBC.  DVT Prophylaxis: Lovenox  subcu  Review of Systems:  All systems were reviewed and were negative unless otherwise mentioned in the HPI   Past Medical History:  Diagnosis Date   Anal cancer (HCC) 08/09/2012   Squamous cell   Anxiety    PANIC ATTACKS   Arthritis    Breast cancer (HCC) 2009   ER+PR+HER-2+   Ductal carcinoma (HCC) 02/2008   invasive    Heart palpitations    History of radiation therapy 09/02/12-10/10/12   anal 50.4Gy   History of shingles 10/2014   HPV (human papilloma virus) anogenital infection    vulvar/ freezing hx   Hx of radiation therapy 2020   Lung nodule    Malignant neoplasm of upper lobe of left lung (HCC) 06/2016   Personal history of chemotherapy    Personal history of radiation therapy 2010   Pneumonia    NO RECENT PROBLEMS   PONV (postoperative nausea and vomiting)    Hypotension   Radiation 10/21/08-12/08/08   Right breast 6240 cGy   Shingles 2017   Status post chemotherapy 02/2008   Taxol/Herceptin   Past Surgical History:  Procedure Laterality Date   BREAST BIOPSY Right 02/04/2008   malignant   BREAST LUMPECTOMY Right 2010   malignant   BREAST LUMPECTOMY WITH AXILLARY LYMPH NODE BIOPSY Right 5/10   Dr. Curvin   BRONCHIAL BIOPSY  12/28/2020   Procedure: BRONCHIAL BIOPSIES;  Surgeon: Brenna Adine CROME, DO;  Location: [facility];  Service: Pulmonary;;   BRONCHIAL BRUSHINGS  12/28/2020   Procedure: BRONCHIAL  BRUSHINGS;  Surgeon: Brenna Adine CROME, DO;  Location: [facility];  Service: Pulmonary;;   BRONCHIAL NEEDLE ASPIRATION BIOPSY  12/28/2020   Procedure: BRONCHIAL NEEDLE ASPIRATION BIOPSIES;  Surgeon: Brenna Adine CROME, DO;  Location: [facility];  Service: Pulmonary;;   BRONCHIAL WASHINGS  12/28/2020   Procedure: BRONCHIAL WASHINGS;  Surgeon: Brenna Adine CROME, DO;  Location: [facility];  Service: Pulmonary;;   CESAREAN SECTION     COLONOSCOPY W/ POLYPECTOMY     ENDOBRONCHIAL ULTRASOUND  12/28/2020   Procedure: ENDOBRONCHIAL ULTRASOUND;  Surgeon: Brenna Adine CROME, DO;  Location: [facility];  Service: Pulmonary;;   EVALUATION UNDER ANESTHESIA WITH ANAL FISTULECTOMY N/A 08/09/2012   Procedure: EXAM UNDER ANESTHESIA AND BIOPSY OF ANAL MASS;  Surgeon: Krystal JINNY Russell, MD;  Location: [facility];  Service: General;  Laterality: N/A;   IR IMAGING GUIDED PORT INSERTION  01/12/2021   IR RADIOLOGIST EVAL & MGMT  03/11/2021   IR REMOVAL TUN ACCESS W/ PORT W/O FL MOD SED  02/24/2021   KNEE ARTHROSCOPY Left    left knee   LUNG LOBECTOMY Left    PORTACATH PLACEMENT  03/2008   dr.  toth    REMOVAL PORTACATH  2011   RIGHT/LEFT HEART CATH AND CORONARY ANGIOGRAPHY N/A 04/06/2023   Procedure: RIGHT/LEFT HEART CATH AND CORONARY ANGIOGRAPHY;  Surgeon: Ladona Heinz, MD;  Location: [facility];  Service: Cardiovascular;  Laterality: N/A;   VIDEO ASSISTED THORACOSCOPY (VATS)/WEDGE RESECTION Left 07/05/2016   Procedure: VIDEO ASSISTED THORACOSCOPY (VATS)/WEDGE RESECTION left upper lobe,  lymph node dissection and placement of OnQ catheter;  Surgeon: Dallas KATHEE Jude, MD;  Location: [facility] OR;  Service: Thoracic;  Laterality: Left;   VIDEO BRONCHOSCOPY N/A 07/05/2016   Procedure: VIDEO BRONCHOSCOPY;  Surgeon: Dallas KATHEE Jude, MD;  Location: [facility] OR;  Service: Thoracic;  Laterality: N/A;   VIDEO BRONCHOSCOPY N/A 05/03/2018   Procedure: VIDEO BRONCHOSCOPY;  Surgeon: Jude Dallas KATHEE, MD;  Location: [facility] OR;  Service:  Thoracic;  Laterality: N/A;   VIDEO BRONCHOSCOPY WITH ENDOBRONCHIAL NAVIGATION N/A 05/03/2018   Procedure: VIDEO BRONCHOSCOPY WITH ENDOBRONCHIAL NAVIGATION WITH BIOPSY;  Surgeon: Jude Dallas KATHEE, MD;  Location: [facility];  Service: Thoracic;  Laterality: N/A;   VIDEO BRONCHOSCOPY WITH ENDOBRONCHIAL NAVIGATION Bilateral 12/28/2020   Procedure: VIDEO BRONCHOSCOPY WITH ENDOBRONCHIAL NAVIGATION;  Surgeon: Brenna Adine CROME, DO;  Location: [facility];  Service: Pulmonary;  Laterality: Bilateral;  ION   VIDEO BRONCHOSCOPY WITH ENDOBRONCHIAL ULTRASOUND N/A 05/03/2018   Procedure: VIDEO BRONCHOSCOPY WITH ENDOBRONCHIAL ULTRASOUND;  Surgeon: Jude Dallas KATHEE, MD;  Location: [facility];  Service: Thoracic;  Laterality: N/A;   VIDEO BRONCHOSCOPY WITH RADIAL ENDOBRONCHIAL ULTRASOUND  12/28/2020   Procedure: RADIAL ENDOBRONCHIAL ULTRASOUND;  Surgeon: Brenna Adine CROME, DO;  Location: [facility];  Service: Pulmonary;;    Social History:  reports that she quit smoking about 10 years ago. Her smoking use included cigarettes. She started smoking about 18 years ago. She has never used smokeless tobacco. She reports that she does not currently use alcohol. She reports that she does not use drugs.  No Known Allergies  Family History  Problem Relation Age of Onset   Lung cancer Father    Stroke Mother    Breast cancer Neg Hx      Prior to Admission medications   Medication Sig Start Date End Date Taking? Authorizing Provider  albuterol  (VENTOLIN  HFA) 108 (90 Base) MCG/ACT inhaler INHALE 2 PUFFS BY MOUTH EVERY 6 HOURS AS NEEDED FOR WHEEZING OR SHORTNESS OF BREATH Patient taking differently: Inhale 2 puffs into the lungs every 6 (six) hours as needed for wheezing or shortness of breath. 02/19/23  Yes Cloretta Arley KATHEE, MD  Budeson-Glycopyrrol-Formoterol  (BREZTRI  AEROSPHERE) 160-9-4.8 MCG/ACT AERO Inhale 2 puffs into the lungs in the morning and at bedtime. 07/10/22  Yes Icard, Bradley L, DO  dapagliflozin  propanediol  (FARXIGA ) 10 MG TABS tablet Take 1 tablet (10 mg total) by mouth daily. 04/19/23  Yes Dunn, Dayna N, PA-C  furosemide  (LASIX ) 20 MG tablet Take 1 tablet (20 mg total) by mouth daily as needed (for weight gain of 3 lbs in 1 day or 5 lbs in 1 week). 04/07/23 04/06/24 Yes Tobie Yetta HERO, MD  LORazepam  (ATIVAN ) 0.5 MG tablet Take 1 tablet (0.5 mg total) by mouth every 8 (eight) hours as needed for anxiety (or nausea). 10/05/22  Yes Cloretta Arley KATHEE, MD  CALCIUM  PO Take 1 tablet by mouth daily. Dosage unknown Patient not taking: Reported on 05/17/2023    [provider]  carvedilol  (COREG ) 3.125 MG tablet Take 1 tablet (3.125 mg total) by mouth 2 (two) times daily with a meal. Patient not taking: Reported on 05/17/2023  04/19/23   Abigail Raphael SAILOR, PA-C    Physical Exam:  Vitals:   05/17/23 0957 05/17/23 0958 05/17/23 1150  BP: (!) 141/119  103/63  Pulse: (!) 115  (!) 103  Resp: 18  18  Temp: 99.8 F (37.7 C)    TempSrc: Oral    SpO2: 97%  97%  Weight:  61.2 kg   Height:  5' 5.5 (1.664 m)    Wt Readings from Last 3 Encounters:  05/17/23 61.2 kg  05/10/23 65.2 kg  05/08/23 64 kg   Body mass index is 22.12 kg/m.  General:  Average built, not in obvious distress HENT: Normocephalic, No scleral pallor or icterus noted. Oral mucosa is moist.  Chest: Diminished breath sounds bilaterally, no wheezes.  Coarse breath sounds noted. CVS: S1 &S2 heard. No murmur.  Regular rate and rhythm. Abdomen: Soft, nontender, nondistended.  Bowel sounds are heard. No abdominal mass palpated Extremities: No cyanosis, clubbing or edema.  Peripheral pulses are palpable. Psych: Alert, awake and oriented, normal mood CNS:  No cranial nerve deficits.  Power equal in all extremities.   Skin: Warm and dry.  No rashes noted.  Labs on Admission:   CBC: Recent Labs  Lab 05/17/23 1025  WBC 7.6  NEUTROABS 6.2  HGB 15.8*  HCT 46.3*  MCV 110.2*  PLT 237    Basic Metabolic Panel: Recent Labs  Lab  05/17/23 1025  NA 133*  K 4.7  CL 94*  CO2 28  GLUCOSE 105*  BUN 16  CREATININE 0.72  CALCIUM  9.0    Liver Function Tests: No results for input(s): AST, ALT, ALKPHOS, BILITOT, PROT, ALBUMIN in the last 168 hours. No results for input(s): LIPASE, AMYLASE in the last 168 hours. No results for input(s): AMMONIA in the last 168 hours.  Cardiac Enzymes: No results for input(s): CKTOTAL, CKMB, CKMBINDEX, TROPONINI in the last 168 hours.  BNP (last 3 results) Recent Labs    04/01/23 1141 04/03/23 0506 05/17/23 1025  BNP 737.4* 222.4* 107.1*    ProBNP (last 3 results) No results for input(s): PROBNP in the last 8760 hours.  CBG: No results for input(s): GLUCAP in the last 168 hours.  Lipase     Component Value Date/Time   LIPASE 38 07/10/2021 1229     Urinalysis    Component Value Date/Time   COLORURINE YELLOW 07/04/2016 0857   APPEARANCEUR CLEAR 07/04/2016 0857   LABSPEC 1.017 07/04/2016 0857   PHURINE 7.0 07/04/2016 0857   GLUCOSEU NEGATIVE 07/04/2016 0857   HGBUR NEGATIVE 07/04/2016 0857   BILIRUBINUR NEGATIVE 07/04/2016 0857   KETONESUR NEGATIVE 07/04/2016 0857   PROTEINUR 100 (A) 07/04/2016 0857   UROBILINOGEN 0.2 12/25/2008 1233   NITRITE NEGATIVE 07/04/2016 0857   LEUKOCYTESUR NEGATIVE 07/04/2016 0857     Drugs of Abuse  No results found for: LABOPIA, COCAINSCRNUR, LABBENZ, AMPHETMU, THCU, LABBARB    Radiological Exams on Admission: CT Angio Chest PE W and/or Wo Contrast Result Date: 05/17/2023 CLINICAL DATA:  63 year old female with lung cancer and shortness of breath. EXAM: CT ANGIOGRAPHY CHEST WITH CONTRAST TECHNIQUE: Multidetector CT imaging of the chest was performed using the standard protocol during bolus administration of intravenous contrast. Multiplanar CT image reconstructions and MIPs were obtained to evaluate the vascular anatomy. RADIATION DOSE REDUCTION: This exam was performed according to the  departmental dose-optimization program which includes automated exposure control, adjustment of the mA and/or kV according to patient size and/or use of iterative reconstruction technique. CONTRAST:  75mL OMNIPAQUE  IOHEXOL  350  MG/ML SOLN COMPARISON:  CTA chest 04/01/2023.  Portable chest 1044 hours today. FINDINGS: Cardiovascular: Excellent contrast bolus timing in the pulmonary arterial tree. No pulmonary artery filling defect. Calcified aortic atherosclerosis. Calcified coronary artery atherosclerosis (series 10, image 194). Heart size remains normal. No pericardial effusion. Mediastinum/Nodes: Mediastinal lymph nodes appear stable since November, individually up to 12 mm short axis, mildly enlarged. Lungs/Pleura: Chronic pleural thickening in the right lung but no pleural effusion. Masslike peribronchial opacity in both the right upper (series 12, image 46) and right lower (series 12, image 84) lungs has not significantly changed since November. Linear and streaky extension of the lower lung opacification to the pleura is also unchanged. Areas of pleural tenting and architectural distortion, unchanged. Stable major airways, patent although abnormally thickened in the right lung. Left upper lobe chronic postoperative changes with staple line and architectural distortion are stable. Left perihilar peribronchial nodular and irregular opacity is stable (e.g. Series 12, image 68). Similar lateral basal segment left lower lobe peribronchial opacity is stable on series 12, image 87. But a larger area of similar posterior basal segment peribronchial opacity on series 12, image 90 is new since November and is indeterminate. Upper Abdomen: Negative visible mostly noncontrast liver, gallbladder, spleen, pancreas, adrenal glands, kidneys, and bowel in the upper abdomen. Musculoskeletal: Stable. No right rib destruction identified. No acute or suspicious osseous lesion identified. Review of the MIP images confirms the above  findings. IMPRESSION: 1. Negative for acute pulmonary embolus. 2. Unchanged bilateral lung abnormality since November CTA with exception of new and indeterminate. Top differential considerations in this setting are new endobronchial spread of tumor and developing bronchopneumonia. No pleural effusion. 3. Stable borderline to mildly enlarged mediastinal lymph nodes. 4.  Aortic Atherosclerosis (ICD10-I70.0). Electronically Signed   By: VEAR Hurst M.D.   On: 05/17/2023 11:55   DG Chest Port 1 View Result Date: 05/17/2023 CLINICAL DATA:  Shortness of breath.  Non-small cell lung cancer. EXAM: PORTABLE CHEST 1 VIEW COMPARISON:  Chest radiograph and CT dated 04/01/2023. FINDINGS: Similar appearance of right mid to lower lung opacity as well as additional area of opacity in the right upper lung better evaluated on the prior CT and PET. No new consolidation. Postsurgical changes of the left upper lobe. Right lung base scarring. No pneumothorax. Stable cardiac silhouette. No acute osseous pathology. IMPRESSION: No interval change.  No new consolidation. Electronically Signed   By: Vanetta Chou M.D.   On: 05/17/2023 10:55    EKG: Personally reviewed by me which shows sinus tachycardia   Consultant: None  Code Status: Full code  Microbiology blood cultures  Antibiotics: Rocephin  and Zithromax   Family Communication:  Patients' condition and plan of care including tests being ordered have been discussed with the patient  who indicate understanding and agree with the plan.   Status is: Inpatient  Severity of Illness: The appropriate patient status for this patient is INPATIENT. Inpatient status is judged to be reasonable and necessary in order to provide the required intensity of service to ensure the patient's safety. The patient's presenting symptoms, physical exam findings, and initial radiographic and laboratory data in the context of their chronic comorbidities is felt to place them at high risk for  further clinical deterioration. Furthermore, it is not anticipated that the patient will be medically stable for discharge from the hospital within 2 midnights of admission. * I certify that at the point of admission it is my clinical judgment that the patient will require inpatient hospital care spanning  beyond 2 midnights from the point of admission due to high intensity of service, high risk for further deterioration and high frequency of surveillance required.*  Signed, Vernal Alstrom, MD [facility] 05/17/2023

## 2023-05-17 NOTE — Care Management (Signed)
 This RNCM received call from [place] with [employer] who reports they are following patient for home O2. Gina Cervantes recently received call from patient's husband re: need for home oxygen.

## 2023-05-17 NOTE — ED Provider Notes (Signed)
 [place] [facility] AT [facility] Provider Note   CSN: [id number] Arrival date & time: 05/17/23  0944     History  Chief Complaint  Patient presents with   Shortness of Breath    Gina Cervantes is a 63 y.o. female.  Pt is a 63 yo female with pmhx significant for lung cancer, anal cancer, breast cancer (no current chemo or radiation), CHF, pulmonary htn, COPD, anemia, and anxiety.  Pt has had a cough and sob for the past few days.  Last night was worse.  She has a home O2 detector and O2 sats were in the upper 60s and low 70s.  Pt was 68% on RA upon arrival in triage.  She was put on oxygen  and required 6L oxygen  to keep sats in the mid-90s.  She is feeling better on the oxygen .            Home Medications Prior to Admission medications   Medication Sig Start Date End Date Taking? Authorizing Provider  albuterol  (VENTOLIN  HFA) 108 (90 Base) MCG/ACT inhaler INHALE 2 PUFFS BY MOUTH EVERY 6 HOURS AS NEEDED FOR WHEEZING OR SHORTNESS OF BREATH Patient taking differently: Inhale 2 puffs into the lungs every 6 (six) hours as needed for wheezing or shortness of breath. 02/19/23  Yes Cloretta Arley NOVAK, MD  Budeson-Glycopyrrol-Formoterol  (BREZTRI  AEROSPHERE) 160-9-4.8 MCG/ACT AERO Inhale 2 puffs into the lungs in the morning and at bedtime. 07/10/22  Yes Icard, Adine CROME, DO  dapagliflozin  propanediol (FARXIGA ) 10 MG TABS tablet Take 1 tablet (10 mg total) by mouth daily. 04/19/23  Yes Dunn, Dayna N, PA-C  furosemide  (LASIX ) 20 MG tablet Take 1 tablet (20 mg total) by mouth daily as needed (for weight gain of 3 lbs in 1 day or 5 lbs in 1 week). 04/07/23 04/06/24 Yes Tobie Yetta HERO, MD  LORazepam  (ATIVAN ) 0.5 MG tablet Take 1 tablet (0.5 mg total) by mouth every 8 (eight) hours as needed for anxiety (or nausea). 10/05/22  Yes Cloretta Arley NOVAK, MD  CALCIUM  PO Take 1 tablet by mouth daily. Dosage unknown Patient not taking: Reported on 05/17/2023    [provider]   carvedilol  (COREG ) 3.125 MG tablet Take 1 tablet (3.125 mg total) by mouth 2 (two) times daily with a meal. Patient not taking: Reported on 05/17/2023 04/19/23   Dunn, Dayna N, PA-C      Allergies    Patient has no known allergies.    Review of Systems   Review of Systems  Respiratory:  Positive for cough and shortness of breath.   All other systems reviewed and are negative.   Physical Exam Updated Vital Signs BP 103/63 (BP Location: Left Arm)   Pulse (!) 103   Temp 99.8 F (37.7 C) (Oral)   Resp 18   Ht 5' 5.5 (1.664 m)   Wt 61.2 kg   LMP 04/07/2008   SpO2 97%   BMI 22.12 kg/m  Physical Exam Vitals and nursing note reviewed.  Constitutional:      Appearance: She is well-developed.  HENT:     Head: Normocephalic and atraumatic.     Mouth/Throat:     Mouth: Mucous membranes are moist.     Pharynx: Oropharynx is clear.  Eyes:     Extraocular Movements: Extraocular movements intact.     Pupils: Pupils are equal, round, and reactive to light.  Cardiovascular:     Rate and Rhythm: Regular rhythm. Tachycardia present.  Pulmonary:  Effort: Tachypnea present.     Breath sounds: Rhonchi present.  Abdominal:     General: Bowel sounds are normal.     Palpations: Abdomen is soft.  Musculoskeletal:        General: Normal range of motion.     Cervical back: Normal range of motion and neck supple.  Skin:    General: Skin is warm.     Capillary Refill: Capillary refill takes less than 2 seconds.  Neurological:     General: No focal deficit present.     Mental Status: She is alert and oriented to person, place, and time.  Psychiatric:        Mood and Affect: Mood normal.        Behavior: Behavior normal.     ED Results / Procedures / Treatments   Labs (all labs ordered are listed, but only abnormal results are displayed) Labs Reviewed  RESP PANEL BY RT-PCR (RSV, FLU A&B, COVID)  RVPGX2 - Abnormal; Notable for the following components:      Result Value   Resp  Syncytial Virus by PCR POSITIVE (*)    All other components within normal limits  BASIC METABOLIC PANEL - Abnormal; Notable for the following components:   Sodium 133 (*)    Chloride 94 (*)    Glucose, Bld 105 (*)    All other components within normal limits  BRAIN NATRIURETIC PEPTIDE - Abnormal; Notable for the following components:   B Natriuretic Peptide 107.1 (*)    All other components within normal limits  CBC WITH DIFFERENTIAL/PLATELET - Abnormal; Notable for the following components:   Hemoglobin 15.8 (*)    HCT 46.3 (*)    MCV 110.2 (*)    MCH 37.6 (*)    Lymphs Abs 0.4 (*)    All other components within normal limits  CULTURE, BLOOD (ROUTINE X 2)  CULTURE, BLOOD (ROUTINE X 2)  I-STAT CG4 LACTIC ACID, ED  I-STAT CG4 LACTIC ACID, ED    EKG None  Radiology CT Angio Chest PE W and/or Wo Contrast Result Date: 05/17/2023 CLINICAL DATA:  63 year old female with lung cancer and shortness of breath. EXAM: CT ANGIOGRAPHY CHEST WITH CONTRAST TECHNIQUE: Multidetector CT imaging of the chest was performed using the standard protocol during bolus administration of intravenous contrast. Multiplanar CT image reconstructions and MIPs were obtained to evaluate the vascular anatomy. RADIATION DOSE REDUCTION: This exam was performed according to the departmental dose-optimization program which includes automated exposure control, adjustment of the mA and/or kV according to patient size and/or use of iterative reconstruction technique. CONTRAST:  75mL OMNIPAQUE  IOHEXOL  350 MG/ML SOLN COMPARISON:  CTA chest 04/01/2023.  Portable chest 1044 hours today. FINDINGS: Cardiovascular: Excellent contrast bolus timing in the pulmonary arterial tree. No pulmonary artery filling defect. Calcified aortic atherosclerosis. Calcified coronary artery atherosclerosis (series 10, image 194). Heart size remains normal. No pericardial effusion. Mediastinum/Nodes: Mediastinal lymph nodes appear stable since November,  individually up to 12 mm short axis, mildly enlarged. Lungs/Pleura: Chronic pleural thickening in the right lung but no pleural effusion. Masslike peribronchial opacity in both the right upper (series 12, image 46) and right lower (series 12, image 84) lungs has not significantly changed since November. Linear and streaky extension of the lower lung opacification to the pleura is also unchanged. Areas of pleural tenting and architectural distortion, unchanged. Stable major airways, patent although abnormally thickened in the right lung. Left upper lobe chronic postoperative changes with staple line and architectural distortion are stable. Left perihilar  peribronchial nodular and irregular opacity is stable (e.g. Series 12, image 68). Similar lateral basal segment left lower lobe peribronchial opacity is stable on series 12, image 87. But a larger area of similar posterior basal segment peribronchial opacity on series 12, image 90 is new since November and is indeterminate. Upper Abdomen: Negative visible mostly noncontrast liver, gallbladder, spleen, pancreas, adrenal glands, kidneys, and bowel in the upper abdomen. Musculoskeletal: Stable. No right rib destruction identified. No acute or suspicious osseous lesion identified. Review of the MIP images confirms the above findings. IMPRESSION: 1. Negative for acute pulmonary embolus. 2. Unchanged bilateral lung abnormality since November CTA with exception of new and indeterminate. Top differential considerations in this setting are new endobronchial spread of tumor and developing bronchopneumonia. No pleural effusion. 3. Stable borderline to mildly enlarged mediastinal lymph nodes. 4.  Aortic Atherosclerosis (ICD10-I70.0). Electronically Signed   By: VEAR Hurst M.D.   On: 05/17/2023 11:55   DG Chest Port 1 View Result Date: 05/17/2023 CLINICAL DATA:  Shortness of breath.  Non-small cell lung cancer. EXAM: PORTABLE CHEST 1 VIEW COMPARISON:  Chest radiograph and CT dated  04/01/2023. FINDINGS: Similar appearance of right mid to lower lung opacity as well as additional area of opacity in the right upper lung better evaluated on the prior CT and PET. No new consolidation. Postsurgical changes of the left upper lobe. Right lung base scarring. No pneumothorax. Stable cardiac silhouette. No acute osseous pathology. IMPRESSION: No interval change.  No new consolidation. Electronically Signed   By: Vanetta Chou M.D.   On: 05/17/2023 10:55    Procedures Procedures    Medications Ordered in ED Medications  cefTRIAXone  (ROCEPHIN ) 1 g in sodium chloride  0.9 % 100 mL IVPB (has no administration in time range)  azithromycin  (ZITHROMAX ) 500 mg in sodium chloride  0.9 % 250 mL IVPB (has no administration in time range)  acetaminophen  (TYLENOL ) tablet 650 mg (650 mg Oral Given 05/17/23 1033)  ipratropium-albuterol  (DUONEB) 0.5-2.5 (3) MG/3ML nebulizer solution 3 mL (3 mLs Nebulization Given 05/17/23 1027)  methylPREDNISolone  sodium succinate (SOLU-MEDROL ) 125 mg/2 mL injection 125 mg (125 mg Intravenous Given 05/17/23 1034)  iohexol  (OMNIPAQUE ) 350 MG/ML injection 75 mL (75 mLs Intravenous Contrast Given 05/17/23 1132)    ED Course/ Medical Decision Making/ A&P                                 Medical Decision Making Amount and/or Complexity of Data Reviewed Labs: ordered. Radiology: ordered.  Risk OTC drugs. Prescription drug management. Decision regarding hospitalization.   This patient presents to the ED for concern of sob, this involves an extensive number of treatment options, and is a complaint that carries with it a high risk of complications and morbidity.  The differential diagnosis includes PE, pna, copd exac, covid/flu/rsv   Co morbidities that complicate the patient evaluation  lung cancer, anal cancer, breast cancer (no current chemo or radiation), CHF, pulmonary htn, COPD, anemia, and anxiety   Additional history obtained:  Additional history  obtained from epic chart review   Lab Tests:  I Ordered, and personally interpreted labs.  The pertinent results include:  cbc nl, lactic nl, bmp nl, bnp 107, flu/covid neg, RSV +   Imaging Studies ordered:  I ordered imaging studies including cxr and CT chest  I independently visualized and interpreted imaging which showed CXR: No interval change.  No new consolidation.  CT chest:  Negative for acute  pulmonary embolus.  2. Unchanged bilateral lung abnormality since November CTA with  exception of new and indeterminate. Top differential considerations  in this setting are new endobronchial spread of tumor and developing  bronchopneumonia. No pleural effusion.  3. Stable borderline to mildly enlarged mediastinal lymph nodes.  4.  Aortic Atherosclerosis (ICD10-I70.0).   I agree with the radiologist interpretation   Cardiac Monitoring:  The patient was maintained on a cardiac monitor.  I personally viewed and interpreted the cardiac monitored which showed an underlying rhythm of: st   Medicines ordered and prescription drug management:  I ordered medication including solumedrol, duoneb,  for sbo  Reevaluation of the patient after these medicines showed that the patient improved I have reviewed the patients home medicines and have made adjustments as needed   Test Considered:  ct   Critical Interventions:  oxygen    Consultations Obtained:  I requested consultation with the hospitalist (Dr. Sonjia),  and discussed lab and imaging findings as well as pertinent plan -he will admit   Problem List / ED Course:  RSV and resp failure due to hypoxia:  pt is feeling better after nebs and steroids and oxygen .  Due to possibility of developing bronchopneumonia, I gave her rocephin /zithromax .   Reevaluation:  After the interventions noted above, I reevaluated the patient and found that they have :improved   Social Determinants of Health:  Lives at  home   Dispostion:  After consideration of the diagnostic results and the patients response to treatment, I feel that the patent would benefit from admission.    CRITICAL CARE Performed by: Mliss Boyers   Total critical care time: 30 minutes  Critical care time was exclusive of separately billable procedures and treating other patients.  Critical care was necessary to treat or prevent imminent or life-threatening deterioration.  Critical care was time spent personally by me on the following activities: development of treatment plan with patient and/or surrogate as well as nursing, discussions with consultants, evaluation of patient's response to treatment, examination of patient, obtaining history from patient or surrogate, ordering and performing treatments and interventions, ordering and review of laboratory studies, ordering and review of radiographic studies, pulse oximetry and re-evaluation of patient's condition.         Final Clinical Impression(s) / ED Diagnoses Final diagnoses:  RSV (acute bronchiolitis due to respiratory syncytial virus)  Acute respiratory failure with hypoxia [facility])    Rx / DC Orders ED Discharge Orders     None         Boyers Mliss, MD 05/17/23 1235

## 2023-05-17 NOTE — ED Triage Notes (Signed)
 Patient has been short of breath since Tuesday. Has lung cancer, not getting chemo or radiation. In triage oxygen was 68% room air and patient was pursed lip breathing. Placed on 6L [place] and now 96%. Has a cough with yellow mucus.

## 2023-05-18 DIAGNOSIS — J9601 Acute respiratory failure with hypoxia: Secondary | ICD-10-CM | POA: Diagnosis not present

## 2023-05-18 LAB — CBC
HCT: 44.8 % (ref 36.0–46.0)
Hemoglobin: 14.7 g/dL (ref 12.0–15.0)
MCH: 36.8 pg — ABNORMAL HIGH (ref 26.0–34.0)
MCHC: 32.8 g/dL (ref 30.0–36.0)
MCV: 112.3 fL — ABNORMAL HIGH (ref 80.0–100.0)
Platelets: 228 10*3/uL (ref 150–400)
RBC: 3.99 MIL/uL (ref 3.87–5.11)
RDW: 14.1 % (ref 11.5–15.5)
WBC: 5.1 10*3/uL (ref 4.0–10.5)
nRBC: 0 % (ref 0.0–0.2)

## 2023-05-18 LAB — COMPREHENSIVE METABOLIC PANEL
ALT: 12 U/L (ref 0–44)
AST: 16 U/L (ref 15–41)
Albumin: 3.5 g/dL (ref 3.5–5.0)
Alkaline Phosphatase: 50 U/L (ref 38–126)
Anion gap: 8 (ref 5–15)
BUN: 25 mg/dL — ABNORMAL HIGH (ref 6–20)
CO2: 33 mmol/L — ABNORMAL HIGH (ref 22–32)
Calcium: 8.7 mg/dL — ABNORMAL LOW (ref 8.9–10.3)
Chloride: 97 mmol/L — ABNORMAL LOW (ref 98–111)
Creatinine, Ser: 0.71 mg/dL (ref 0.44–1.00)
GFR, Estimated: >60 mL/min (ref >60)
Glucose, Bld: 106 mg/dL — ABNORMAL HIGH (ref 70–99)
Potassium: 4.1 mmol/L (ref 3.5–5.1)
Sodium: 138 mmol/L (ref 135–145)
Total Bilirubin: 0.3 mg/dL (ref 0.0–1.2)
Total Protein: 7.4 g/dL (ref 6.5–8.1)

## 2023-05-18 LAB — MAGNESIUM: Magnesium: 1.9 mg/dL (ref 1.7–2.4)

## 2023-05-18 LAB — PHOSPHORUS: Phosphorus: 5.1 mg/dL — ABNORMAL HIGH (ref 2.5–4.6)

## 2023-05-18 MED ORDER — FLUTICASONE PROPIONATE 50 MCG/ACT NA SUSP
1.0000 | Freq: Every day | NASAL | Status: DC
  Administered 2023-05-18 – 2023-05-19 (×2): 1 via NASAL
  Filled 2023-05-18: qty 16

## 2023-05-18 MED ORDER — AZITHROMYCIN 250 MG PO TABS
250.0000 mg | ORAL_TABLET | Freq: Every day | ORAL | Status: DC
  Administered 2023-05-18: 250 mg via ORAL
  Filled 2023-05-18: qty 1

## 2023-05-18 MED ORDER — MAGNESIUM OXIDE -MG SUPPLEMENT 400 (240 MG) MG PO TABS
400.0000 mg | ORAL_TABLET | Freq: Two times a day (BID) | ORAL | Status: DC
  Administered 2023-05-18 – 2023-05-19 (×3): 400 mg via ORAL
  Filled 2023-05-18 (×3): qty 1

## 2023-05-18 NOTE — Progress Notes (Addendum)
 SATURATION QUALIFICATIONS: (This note is used to comply with regulatory documentation for home oxygen )  Patient Saturations on Room Air at Rest = 82%    Patient Saturations on 4 Liters of oxygen  while Ambulating = 87-92%  Please briefly explain why patient needs home oxygen : to maintain appropriate SpO2 levels.   Sylvan Nest Kistler PT 05/18/2023  Acute Rehabilitation Services  Office [phone]

## 2023-05-18 NOTE — Progress Notes (Signed)
 Inhalers sent back to pharmacy. Pt requested to take her home inhalers.

## 2023-05-18 NOTE — Progress Notes (Signed)
 PROGRESS NOTE  Gina Cervantes FMW:[id number] DOB: 20-Oct-1960 DOA: 05/17/2023 PCP: Arloa Elsie [name], MD   LOS: 1 day   Brief narrative:  Patient is a 63 years old female with past medical history of lung cancer/anal cancer breast cancer not on chemo or radiation currently with history of congestive heart failure and pulmonary hypertension, COPD anemia and anxiety presented to hospital with cough and shortness of breath for last few days worsening for last 1 day.  Patient stated that she had pulse ox in the upper 60s and low 70s at home.  EMS was called and brought into the hospital.  Of note patient was recently discharged 04/07/2023 after being treated for hypoxic respiratory failure with heart failure. In triage, patient was noted to have pulse ox of 68% on room air and required 6 L of oxygen  to keep saturation in the mid 90s.  Blood pressure was elevated and patient was tachycardic.  Temperature max of 99.8 F.  Initial labs showed no leukocytosis, hemoglobin 15.8 with MCV elevated at 110.  Respiratory panel was positive for RSV.  BNP was elevated at 107.  Sodium slightly low at 133 with creatinine of 0.7.  Lactate was within normal range at 1.4.  Chest x-ray done in the ED showed no new consolidation or interval changes but abnormal chest x-ray.  CT angiogram of the chest was then performed was negative for pulmonary embolism with bilateral lung abnormality.  Possibility of endobronchial spread of tumor/developing bronchopneumonia but no definite consolidation reported.  Patient received Rocephin  and Zithromax  and IV Solu-Medrol  in the ED including DuoNeb, blood cultures were drawn and patient was considered for admission to the hospital for further evaluation and treatment.  Assessment and plan  Acute hypoxic respiratory failure secondary to RSV infection with COPD exacerbation.  Possible underlying bronchopneumonia.  No definite consolidation but possibility of bronchopneumonia.  Received  Rocephin  and Zithromax .  Procalcitonin less than 0.1.  No leukocytosis or fever.  Will discontinue Rocephin  and continue Zithromax  for possible underlying bronchitis.  Continue with oxygen  supplementation currently at 3 L from 6 L on presentation still very symptomatic with wheezing.  Continue, bronchodilators, incentive spirometry, supportive care.  Continue IV steroids, will need follow-up with pulmonary as outpatient.  Will likely need oxygen  on discharge.  Will do oxygen  desat study in AM.  Chronic systolic/diastolic congestive heart failure, pulmonary hypertension. Review of 2D echocardiogram from 04/02/2023 with LV ejection fraction of 45% with grade 1 diastolic dysfunction.  Cardiac catheterization done at that time showed mild coronary calcification with mild pulmonary hypertension.  Patient was recommended to be started on Farxiga  Entresto , low-dose statin, Lasix  20 mg and Coreg .  Patient currently not taking Coreg .  Entresto  was discontinued due to hyperkalemia.  Will continue with Lasix  40 daily, Farxiga .  History of COPD, mild pulmonary hypertension with mild COPD exacerbation.   Question pulmonary fibrosis from radiation.  Continue with oxygen  nebulizer steroids.  Will need to follow-up with pulmonary as outpatient.  History of anal cancer breast cancer lung cancer.  Follow-up with outpatient oncology Dr Deanne as outpatient..  Anxiety.  On Ativan  0.5 every 8 hourly.  Will continue.  Chronic anemia.  Likely secondary to anemia of malignancy.  Monitor CBC.  DVT prophylaxis: enoxaparin  (LOVENOX ) injection 40 mg Start: 05/17/23 2000   Disposition: Home likely 05/19/2023 if continues to improve  Status is: Inpatient Remains inpatient appropriate because: Proximal respiratory failure, COPD exacerbation pending clinical improvement    Code Status:     Code Status:  Full Code  Family Communication: None at  bedside  Consultants: None  Procedures: None  Anti-infectives:  Zithromax   Anti-infectives (From admission, onward)    Start     Dose/Rate Route Frequency Ordered Stop   05/18/23 1000  cefTRIAXone  (ROCEPHIN ) 2 g in sodium chloride  0.9 % 100 mL IVPB        2 g 200 mL/hr over 30 Minutes Intravenous Daily 05/17/23 1258 05/23/23 0959   05/18/23 1000  azithromycin  (ZITHROMAX ) 500 mg in sodium chloride  0.9 % 250 mL IVPB        500 mg 250 mL/hr over 60 Minutes Intravenous Daily 05/17/23 1258 05/23/23 0959   05/17/23 1245  cefTRIAXone  (ROCEPHIN ) 1 g in sodium chloride  0.9 % 100 mL IVPB        1 g 200 mL/hr over 30 Minutes Intravenous  Once 05/17/23 1235 05/17/23 1349   05/17/23 1245  azithromycin  (ZITHROMAX ) 500 mg in sodium chloride  0.9 % 250 mL IVPB        500 mg 250 mL/hr over 60 Minutes Intravenous  Once 05/17/23 1235 05/17/23 1546        Subjective: Today, patient was seen and examined at bedside.  Still has dyspnea on exertion with pulse ox dipping on exertion.  Has some dry cough with wheezing.  dyspneic but wants to go home.  Objective: Vitals:   05/18/23 0831 05/18/23 0855  BP:  120/77  Pulse:  (!) 120  Resp:    Temp:  99.2 F (37.3 C)  SpO2: (!) 82% 94%    Intake/Output Summary (Last 24 hours) at 05/18/2023 1108 Last data filed at 05/17/2023 2056 Gross per 24 hour  Intake 466.67 ml  Output --  Net 466.67 ml   Filed Weights   05/17/23 0958  Weight: 61.2 kg   Body mass index is 22.12 kg/m.   Physical Exam: GENERAL: Patient is alert awake and oriented. Not in obvious distress.  On nasal cannula oxygen  at 3 L/min. HENT: No scleral pallor or icterus. Pupils equally reactive to light. Oral mucosa is moist NECK: is supple, no gross swelling noted. CHEST: Coarse breath sounds bilaterally with expiratory wheezes. CVS: S1 and S2 heard, no murmur. Regular rate and rhythm.  ABDOMEN: Soft, non-tender, bowel sounds are present. EXTREMITIES: No edema. CNS: Cranial  nerves are intact. No focal motor deficits. SKIN: warm and dry without rashes.  Data Review: I have personally reviewed the following laboratory data and studies,  CBC: Recent Labs  Lab 05/17/23 1025 05/18/23 0541  WBC 7.6 5.1  NEUTROABS 6.2  --   HGB 15.8* 14.7  HCT 46.3* 44.8  MCV 110.2* 112.3*  PLT 237 228   Basic Metabolic Panel: Recent Labs  Lab 05/17/23 1025 05/18/23 0541  NA 133* 138  K 4.7 4.1  CL 94* 97*  CO2 28 33*  GLUCOSE 105* 106*  BUN 16 25*  CREATININE 0.72 0.71  CALCIUM  9.0 8.7*  MG  --  1.9  PHOS  --  5.1*   Liver Function Tests: Recent Labs  Lab 05/18/23 0541  AST 16  ALT 12  ALKPHOS 50  BILITOT 0.3  PROT 7.4  ALBUMIN 3.5   No results for input(s): LIPASE, AMYLASE in the last 168 hours. No results for input(s): AMMONIA in the last 168 hours. Cardiac Enzymes: No results for input(s): CKTOTAL, CKMB, CKMBINDEX, TROPONINI in the last 168 hours. BNP (last 3 results) Recent Labs    04/01/23 1141 04/03/23 0506 05/17/23 1025  BNP 737.4* 222.4* 107.1*  ProBNP (last 3 results) No results for input(s): PROBNP in the last 8760 hours.  CBG: No results for input(s): GLUCAP in the last 168 hours. Recent Results (from the past 240 hours)  Culture, blood (routine x 2)     Status: None (Preliminary result)   Collection Time: 05/17/23 10:25 AM   Specimen: BLOOD  Result Value Ref Range Status   Specimen Description   Final    BLOOD RIGHT ANTECUBITAL Performed at [facility], [address], [place], [place] [address]    Special Requests   Final    BOTTLES DRAWN AEROBIC AND ANAEROBIC Blood Culture results may not be optimal due to an excessive volume of blood received in culture bottles Performed at [facility], [address] [place], [place] [address]    Culture   Final    NO GROWTH < 24 HOURS Performed at [facility], [address], [place], [place] [address]    Report  Status PENDING  Incomplete  Resp panel by RT-PCR (RSV, Flu A&B, Covid) Anterior Nasal Swab     Status: Abnormal   Collection Time: 05/17/23 11:00 AM   Specimen: Anterior Nasal Swab  Result Value Ref Range Status   SARS Coronavirus 2 by RT PCR NEGATIVE NEGATIVE Final    Comment: (NOTE) SARS-CoV-2 target nucleic acids are NOT DETECTED.  The SARS-CoV-2 RNA is generally detectable in upper respiratory specimens during the acute phase of infection. The lowest concentration of SARS-CoV-2 viral copies this assay can detect is 138 copies/mL. A negative result does not preclude SARS-Cov-2 infection and should not be used as the sole basis for treatment or other patient management decisions. A negative result may occur with  improper specimen collection/handling, submission of specimen other than nasopharyngeal swab, presence of viral mutation(s) within the areas targeted by this assay, and inadequate number of viral copies(<138 copies/mL). A negative result must be combined with clinical observations, patient history, and epidemiological information. The expected result is Negative.  Fact Sheet for Patients:  bloggercourse.com  Fact Sheet for Healthcare Providers:  seriousbroker.it  This test is no t yet approved or cleared by the [place]  FDA and  has been authorized for detection and/or diagnosis of SARS-CoV-2 by FDA under an Emergency Use Authorization (EUA). This EUA will remain  in effect (meaning this test can be used) for the duration of the COVID-19 declaration under Section 564(b)(1) of the Act, 21 U.S.C.section 360bbb-3(b)(1), unless the authorization is terminated  or revoked sooner.       Influenza A by PCR NEGATIVE NEGATIVE Final   Influenza B by PCR NEGATIVE NEGATIVE Final    Comment: (NOTE) The Xpert Xpress SARS-CoV-2/FLU/RSV plus assay is intended as an aid in the diagnosis of influenza from Nasopharyngeal swab  specimens and should not be used as a sole basis for treatment. Nasal washings and aspirates are unacceptable for Xpert Xpress SARS-CoV-2/FLU/RSV testing.  Fact Sheet for Patients: bloggercourse.com  Fact Sheet for Healthcare Providers: seriousbroker.it  This test is not yet approved or cleared by the [place]  FDA and has been authorized for detection and/or diagnosis of SARS-CoV-2 by FDA under an Emergency Use Authorization (EUA). This EUA will remain in effect (meaning this test can be used) for the duration of the COVID-19 declaration under Section 564(b)(1) of the Act, 21 U.S.C. section 360bbb-3(b)(1), unless the authorization is terminated or revoked.     Resp Syncytial Virus by PCR POSITIVE (A) NEGATIVE Final    Comment: (NOTE) Fact  Sheet for Patients: bloggercourse.com  Fact Sheet for Healthcare Providers: seriousbroker.it  This test is not yet approved or cleared by the [place]  FDA and has been authorized for detection and/or diagnosis of SARS-CoV-2 by FDA under an Emergency Use Authorization (EUA). This EUA will remain in effect (meaning this test can be used) for the duration of the COVID-19 declaration under Section 564(b)(1) of the Act, 21 U.S.C. section 360bbb-3(b)(1), unless the authorization is terminated or revoked.  Performed at [facility], [address], [place], [place] [address]   Culture, blood (routine x 2)     Status: None (Preliminary result)   Collection Time: 05/17/23  1:10 PM   Specimen: BLOOD LEFT HAND  Result Value Ref Range Status   Specimen Description   Final    BLOOD LEFT HAND Performed at [facility], [address] [place], [place] [address]    Special Requests   Final    BOTTLES DRAWN AEROBIC AND ANAEROBIC Blood Culture results may not be optimal due to an inadequate volume of blood received in  culture bottles Performed at [facility], [address] [place], [place] [address]    Culture   Final    NO GROWTH < 24 HOURS Performed at [facility], [address] [place], [place] [address]    Report Status PENDING  Incomplete  MRSA Next Gen by PCR, Nasal     Status: None   Collection Time: 05/17/23  4:32 PM   Specimen: Nasal Mucosa; Nasal Swab  Result Value Ref Range Status   MRSA by PCR Next Gen NOT DETECTED NOT DETECTED Final    Comment: (NOTE) The GeneXpert MRSA Assay (FDA approved for NASAL specimens only), is one component of a comprehensive MRSA colonization surveillance program. It is not intended to diagnose MRSA infection nor to guide or monitor treatment for MRSA infections. Test performance is not FDA approved in patients less than 32 years old. Performed at [facility], [address] [place], [place] [address]   Expectorated Sputum Assessment w Gram Stain, Rflx to Resp Cult     Status: None   Collection Time: 05/17/23  6:09 PM   Specimen: Expectorated Sputum  Result Value Ref Range Status   Specimen Description EXPECTORATED SPUTUM  Final   Special Requests NONE  Final   Sputum evaluation   Final    THIS SPECIMEN IS ACCEPTABLE FOR SPUTUM CULTURE Performed at [facility], [address] [place], [place] [address]    Report Status 05/17/2023 FINAL  Final  Culture, Respiratory w Gram Stain     Status: None (Preliminary result)   Collection Time: 05/17/23  6:09 PM  Result Value Ref Range Status   Specimen Description   Final    EXPECTORATED SPUTUM Performed at [facility], [address] [place], [place] [address]    Special Requests   Final    NONE Reflexed from [id number] Performed at [facility], [address] [place], [place] [address]    Gram Stain   Final    RARE WBC PRESENT, PREDOMINANTLY PMN RARE GRAM NEGATIVE RODS RARE GRAM POSITIVE COCCI  IN CHAINS Performed at [facility], [address] [place], [place] [address]    Culture PENDING  Incomplete   Report Status PENDING  Incomplete     Studies: CT Angio Chest PE W and/or Wo Contrast Result Date: 05/17/2023 CLINICAL DATA:  63 year old female with  lung cancer and shortness of breath. EXAM: CT ANGIOGRAPHY CHEST WITH CONTRAST TECHNIQUE: Multidetector CT imaging of the chest was performed using the standard protocol during bolus administration of intravenous contrast. Multiplanar CT image reconstructions and MIPs were obtained to evaluate the vascular anatomy. RADIATION DOSE REDUCTION: This exam was performed according to the departmental dose-optimization program which includes automated exposure control, adjustment of the mA and/or kV according to patient size and/or use of iterative reconstruction technique. CONTRAST:  75mL OMNIPAQUE  IOHEXOL  350 MG/ML SOLN COMPARISON:  CTA chest 04/01/2023.  Portable chest 1044 hours today. FINDINGS: Cardiovascular: Excellent contrast bolus timing in the pulmonary arterial tree. No pulmonary artery filling defect. Calcified aortic atherosclerosis. Calcified coronary artery atherosclerosis (series 10, image 194). Heart size remains normal. No pericardial effusion. Mediastinum/Nodes: Mediastinal lymph nodes appear stable since November, individually up to 12 mm short axis, mildly enlarged. Lungs/Pleura: Chronic pleural thickening in the right lung but no pleural effusion. Masslike peribronchial opacity in both the right upper (series 12, image 46) and right lower (series 12, image 84) lungs has not significantly changed since November. Linear and streaky extension of the lower lung opacification to the pleura is also unchanged. Areas of pleural tenting and architectural distortion, unchanged. Stable major airways, patent although abnormally thickened in the right lung. Left upper lobe chronic postoperative changes with staple line and architectural  distortion are stable. Left perihilar peribronchial nodular and irregular opacity is stable (e.g. Series 12, image 68). Similar lateral basal segment left lower lobe peribronchial opacity is stable on series 12, image 87. But a larger area of similar posterior basal segment peribronchial opacity on series 12, image 90 is new since November and is indeterminate. Upper Abdomen: Negative visible mostly noncontrast liver, gallbladder, spleen, pancreas, adrenal glands, kidneys, and bowel in the upper abdomen. Musculoskeletal: Stable. No right rib destruction identified. No acute or suspicious osseous lesion identified. Review of the MIP images confirms the above findings. IMPRESSION: 1. Negative for acute pulmonary embolus. 2. Unchanged bilateral lung abnormality since November CTA with exception of new and indeterminate. Top differential considerations in this setting are new endobronchial spread of tumor and developing bronchopneumonia. No pleural effusion. 3. Stable borderline to mildly enlarged mediastinal lymph nodes. 4.  Aortic Atherosclerosis (ICD10-I70.0). Electronically Signed   By: VEAR Hurst M.D.   On: 05/17/2023 11:55   DG Chest Port 1 View Result Date: 05/17/2023 CLINICAL DATA:  Shortness of breath.  Non-small cell lung cancer. EXAM: PORTABLE CHEST 1 VIEW COMPARISON:  Chest radiograph and CT dated 04/01/2023. FINDINGS: Similar appearance of right mid to lower lung opacity as well as additional area of opacity in the right upper lung better evaluated on the prior CT and PET. No new consolidation. Postsurgical changes of the left upper lobe. Right lung base scarring. No pneumothorax. Stable cardiac silhouette. No acute osseous pathology. IMPRESSION: No interval change.  No new consolidation. Electronically Signed   By: Vanetta Chou M.D.   On: 05/17/2023 10:55      Vernal Alstrom, MD  [facility] 05/18/2023  If 7PM-7AM, please contact night-coverage

## 2023-05-18 NOTE — Evaluation (Signed)
 Physical Therapy Evaluation Patient Details Name: Gina Cervantes MRN: [id number] DOB: 1960-07-01 Today's Date: 05/18/2023  History of Present Illness  63 years old female  presented to hospital with cough and shortness of breath for last few days worsening for last 1 day. Dx of respiratory failure, RSV. Pt with past medical history of lung cancer/anal cancer breast cancer not on chemo or radiation currently with history of congestive heart failure and pulmonary hypertension, COPD anemia and anxiety. Recent DC from hospital 04/07/23 for respiratory failure.  Clinical Impression  Pt ambulated 340' without an assistive device, no loss of balance. SpO2 82% on room air at rest, 87-92% on 4L O2 walking, HR 141 max walking. No further PT indicated as she is independent with mobility. Mobility team to follow.          If plan is discharge home, recommend the following:     Can travel by private vehicle        Equipment Recommendations None recommended by PT  Recommendations for Other Services       Functional Status Assessment Patient has not had a recent decline in their functional status     Precautions / Restrictions Precautions Precautions: Other (comment) Precaution Comments: monitor O2 Restrictions Weight Bearing Restrictions Per Provider Order: No      Mobility  Bed Mobility Overal bed mobility: Independent                  Transfers Overall transfer level: Independent                      Ambulation/Gait Ambulation/Gait assistance: Independent Gait Distance (Feet): 340 Feet Assistive device: None Gait Pattern/deviations: WFL(Within Functional Limits) Gait velocity: WFL     General Gait Details: SpO2 82% on room air at rest, 87-92% 4L walking; HR 141 max walking; VCs for pursed lip breathing.  Stairs            Wheelchair Mobility     Tilt Bed    Modified Rankin (Stroke Patients Only)       Balance Overall balance assessment:  Independent                                           Pertinent Vitals/Pain Pain Assessment Pain Assessment: No/denies pain    Home Living Family/patient expects to be discharged to:: Private residence Living Arrangements: Spouse/significant other                      Prior Function Prior Level of Function : Independent/Modified Independent                     Extremity/Trunk Assessment   Upper Extremity Assessment Upper Extremity Assessment: Overall WFL for tasks assessed    Lower Extremity Assessment Lower Extremity Assessment: Overall WFL for tasks assessed    Cervical / Trunk Assessment Cervical / Trunk Assessment: Normal  Communication   Communication Communication: No apparent difficulties  Cognition Arousal: Alert Behavior During Therapy: WFL for tasks assessed/performed Overall Cognitive Status: Within Functional Limits for tasks assessed                                          General Comments      Exercises  Assessment/Plan    PT Assessment Patient does not need any further PT services  PT Problem List         PT Treatment Interventions      PT Goals (Current goals can be found in the Care Plan section)  Acute Rehab PT Goals PT Goal Formulation: All assessment and education complete, DC therapy    Frequency       Co-evaluation               AM-PAC PT 6 Clicks Mobility  Outcome Measure Help needed turning from your back to your side while in a flat bed without using bedrails?: None Help needed moving from lying on your back to sitting on the side of a flat bed without using bedrails?: None Help needed moving to and from a bed to a chair (including a wheelchair)?: None Help needed standing up from a chair using your arms (e.g., wheelchair or bedside chair)?: None Help needed to walk in hospital room?: None Help needed climbing 3-5 steps with a railing? : None 6 Click Score:  24    End of Session Equipment Utilized During Treatment: Oxygen  Activity Tolerance: Patient limited by fatigue Patient left: in chair;with call bell/phone within reach Nurse Communication: Mobility status      Time: 0811-0826 PT Time Calculation (min) (ACUTE ONLY): 15 min   Charges:   PT Evaluation $PT Eval Low Complexity: 1 Low   PT General Charges $$ ACUTE PT VISIT: 1 Visit         Sylvan Delon Copp PT 05/18/2023  Acute Rehabilitation Services  Office [phone]

## 2023-05-18 NOTE — TOC Initial Note (Addendum)
 Transition of Care [facility]) - Initial/Assessment Note    Patient Details  Name: Gina Cervantes MRN: [id number] Date of Birth: Aug 02, 1960  Transition [facility]) CM/SW Contact:    Hoy DELENA Bigness, LCSW Phone Number: 05/18/2023, 11:38 AM  Clinical Narrative:                 Pt from home with spouse. Pt with requirement of 4L O2. Pt has already been in contact with [employer] for home O2 need. CSW spoke with Jermaine at [facility] who will provide home O2 for pt once orders have been placed. MD notified of need for order. TOC following.   ADDENDUM: Contacted Jermaine with [employer] to inform of O2 order in chart.   Expected Discharge Plan: Home/Self Care Barriers to Discharge: Continued Medical Work up   Patient Goals and CMS Choice Patient states their goals for this hospitalization and ongoing recovery are:: To go home CMS Medicare.gov Compare Post Acute Care list provided to::  (NA) Choice offered to / list presented to :  (NA) [employer] ownership interest in [facility].provided to::  (NA)    Expected Discharge Plan and Services In-house Referral: NA Discharge Planning Services: NA Post Acute Care Choice: Durable Medical Equipment Living arrangements for the past 2 months: Single Family Home                 DME Arranged: Oxygen  DME Agency: [employer] Date DME Agency Contacted: 05/18/23 Time DME Agency Contacted: [phone] Representative spoke with at DME Agency: [place]            Prior Living Arrangements/Services Living arrangements for the past 2 months: Single Family Home Lives with:: Spouse Patient language and need for interpreter reviewed:: Yes Do you feel safe going back to the place where you live?: Yes      Need for Family Participation in Patient Care: No (Comment) Care giver support system in place?: No (comment)   Criminal Activity/Legal Involvement Pertinent to Current Situation/Hospitalization: No - Comment as needed  Activities of Daily  Living   ADL Screening (condition at time of admission) Independently performs ADLs?: Yes (appropriate for developmental age) Is the patient deaf or have difficulty hearing?: No Does the patient have difficulty seeing, even when wearing glasses/contacts?: No Does the patient have difficulty concentrating, remembering, or making decisions?: No  Permission Sought/Granted Permission sought to share information with : Facility Industrial/product Designer granted to share information with : Yes, Verbal Permission Granted     Permission granted to share info w AGENCY: [employer]        Emotional Assessment Appearance:: Appears stated age Attitude/Demeanor/Rapport: Engaged Affect (typically observed): Accepting Orientation: : Oriented to Self, Oriented to Place, Oriented to  Time, Oriented to Situation Alcohol / Substance Use: Not Applicable Psych Involvement: No (comment)  Admission diagnosis:  RSV (acute bronchiolitis due to respiratory syncytial virus) [J21.0] Acute respiratory failure with hypoxia (HCC) [J96.01] Malignant neoplasm of lung, unspecified laterality, unspecified part of lung (HCC) [C34.90] Patient Active Problem List   Diagnosis Date Noted   Acute respiratory failure with hypoxia (HCC) 05/17/2023   RSV infection 05/17/2023   Acute hypoxemic respiratory failure (HCC) 04/01/2023   Elevated troponin 04/01/2023   Acute on chronic combined systolic and diastolic CHF (congestive heart failure) (HCC) 04/01/2023   Hypercarbia 04/01/2023   Pap smear abnormality of cervix/human papillomavirus (HPV) positive 02/07/2022   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   Upper airway cough syndrome 08/19/2021   Local infection due to  Port-A-Cath 04/27/2021   Non-small cell lung cancer with metastasis (HCC) 12/16/2020   Lung nodules 12/01/2020   High risk human papilloma virus (HPV) infection of cervix 12/29/2019   Goals of care, counseling/discussion 12/25/2019   Malignant neoplasm of  lower lobe of right lung (HCC) 06/26/2019   Malignant neoplasm of bronchus of right upper lobe (HCC) 07/16/2018   Lung cancer, left  upper lobe (HCC) 07/05/2016   Anal cancer (HCC) 08/15/2012   Breast cancer, right (HCC) 06/04/2007   PCP:  Arloa Elsie [name], MD Pharmacy:   [id number] - [place], [place] - [address] [place] [address] Phone: [phone] Fax: [phone]     Social Drivers of Health (SDOH) Social History: SDOH Screenings   Food Insecurity: No Food Insecurity (05/17/2023)  Housing: Low Risk  (05/17/2023)  Transportation Needs: No Transportation Needs (05/17/2023)  Utilities: Not At Risk (05/17/2023)  Depression (PHQ2-9): Low Risk  (05/10/2023)  Tobacco Use: Medium Risk (05/17/2023)   SDOH Interventions:     Readmission Risk Interventions    05/18/2023   11:36 AM  Readmission Risk Prevention Plan  Transportation Screening Complete  PCP or Specialist Appt within 3-5 Days Complete  HRI or Home Care Consult Complete  Social Work Consult for Recovery Care Planning/Counseling Complete  Palliative Care Screening Not Applicable  Medication Review Oceanographer) Complete

## 2023-05-18 NOTE — Plan of Care (Signed)
  Problem: Clinical Measurements: Goal: Ability to maintain clinical measurements within normal limits will improve Outcome: Progressing Goal: Respiratory complications will improve Outcome: Progressing   Problem: Coping: Goal: Level of anxiety will decrease Outcome: Progressing   

## 2023-05-18 NOTE — Progress Notes (Signed)
   05/18/23 1300  Spiritual Encounters  Type of Visit Initial  Care provided to: Patient  Referral source Chaplain assessment  Reason for visit Urgent spiritual support  OnCall Visit No  Spiritual Framework  Presenting Themes Meaning/purpose/sources of inspiration;Goals in life/care;Values and beliefs;Significant life change;Coping tools;Impactful experiences and emotions;Courage hope and growth;Rituals and practive  Community/Connection Family;Friend(s);Faith community  Patient Stress Factors Family relationships;Major life changes;Health changes  Family Stress Factors None identified   Chaplain met with patient at bedside today - she shared her health and spiritual narrative and sought to discuss her state of emotional needs at this time.  Chaplain provided spiritual support.

## 2023-05-19 DIAGNOSIS — J9601 Acute respiratory failure with hypoxia: Secondary | ICD-10-CM | POA: Diagnosis not present

## 2023-05-19 LAB — CBC
HCT: 43.4 % (ref 36.0–46.0)
Hemoglobin: 14.5 g/dL (ref 12.0–15.0)
MCH: 36.9 pg — ABNORMAL HIGH (ref 26.0–34.0)
MCHC: 33.4 g/dL (ref 30.0–36.0)
MCV: 110.4 fL — ABNORMAL HIGH (ref 80.0–100.0)
Platelets: 222 10*3/uL (ref 150–400)
RBC: 3.93 MIL/uL (ref 3.87–5.11)
RDW: 13.7 % (ref 11.5–15.5)
WBC: 5.8 10*3/uL (ref 4.0–10.5)
nRBC: 0 % (ref 0.0–0.2)

## 2023-05-19 LAB — BASIC METABOLIC PANEL
Anion gap: 13 (ref 5–15)
BUN: 24 mg/dL — ABNORMAL HIGH (ref 6–20)
CO2: 32 mmol/L (ref 22–32)
Calcium: 8.7 mg/dL — ABNORMAL LOW (ref 8.9–10.3)
Chloride: 91 mmol/L — ABNORMAL LOW (ref 98–111)
Creatinine, Ser: 0.81 mg/dL (ref 0.44–1.00)
GFR, Estimated: >60 mL/min (ref >60)
Glucose, Bld: 185 mg/dL — ABNORMAL HIGH (ref 70–99)
Potassium: 3.6 mmol/L (ref 3.5–5.1)
Sodium: 136 mmol/L (ref 135–145)

## 2023-05-19 LAB — PHOSPHORUS: Phosphorus: 3 mg/dL (ref 2.5–4.6)

## 2023-05-19 LAB — MAGNESIUM: Magnesium: 1.9 mg/dL (ref 1.7–2.4)

## 2023-05-19 MED ORDER — AZITHROMYCIN 250 MG PO TABS: 250.0000 mg | ORAL_TABLET | Freq: Every day | ORAL | 0 refills | Status: AC

## 2023-05-19 MED ORDER — PREDNISONE 20 MG PO TABS: 40.0000 mg | ORAL_TABLET | Freq: Every day | ORAL | 0 refills | Status: AC

## 2023-05-19 NOTE — Discharge Summary (Signed)
 Physician Discharge Summary  Gina Cervantes FMW:[id number] DOB: 30-Dec-1960 DOA: 05/17/2023  PCP: Gina Elsie [name], MD  Admit date: 05/17/2023 Discharge date: 05/19/2023  Admitted From: Home  Discharge disposition: home   Recommendations for Outpatient Follow-Up:   Follow up with your primary care provider in one week.  Check CBC, BMP, magnesium  in the next visit Patient has been initiated on oxygen  at home will need to be titrated as outpatient.  Discharge Diagnosis:   Principal Problem:   Acute respiratory failure with hypoxia (HCC) Active Problems:   Breast cancer, right (HCC)   Anal cancer (HCC)   Lung cancer, left  upper lobe (HCC)   Lung nodules   COPD with asthma (HCC)   RSV infection   Discharge Condition: Improved.  Diet recommendation: Low sodium, heart healthy.    Wound care: None.  Code status: Full.   History of Present Illness:   Patient is a 63 years old female with past medical history of lung cancer/anal cancer breast cancer not on chemo or radiation currently with history of congestive heart failure and pulmonary hypertension, COPD anemia and anxiety presented to hospital with cough and shortness of breath for last few days worsening for last 1 day.  Patient stated that she had pulse ox in the upper 60s and low 70s at home.  EMS was called and brought into the hospital.  Of note patient was recently discharged 04/07/2023 after being treated for hypoxic respiratory failure with heart failure.  Patient reported that she did have some scratchy throat and some sinus congestion in runny nose prior to this and this morning she was very short of breath and dyspneic to the point that she had to resort her to oxygen  that she had not been using recently.  In triage, patient was noted to have pulse ox of 68% on room air and required 6 L of oxygen  to keep saturation in the mid 90s.  Blood pressure was elevated and patient was tachycardic.  Temperature max of 99.8 F.   Initial labs showed no leukocytosis, hemoglobin 15.8 with MCV elevated at 110.  Respiratory panel was positive for RSV.  BNP was elevated at 107.  Sodium slightly low at 133 with creatinine of 0.7.  Lactate was within normal range at 1.4.  Chest x-ray done in the ED showed no new consolidation or interval changes but abnormal chest x-ray.  CT angiogram of the chest was then performed was negative for pulmonary embolism with bilateral lung abnormality.  Possibility of endobronchial spread of tumor/developing bronchopneumonia but no definite consolidation reported.  Patient received Rocephin  and Zithromax  and IV Solu-Medrol  in the ED including DuoNeb, blood cultures were drawn and patient was considered for admission to the hospital for further evaluation and treatment.   Hospital Course:   Following conditions were addressed during hospitalization as listed below,  Acute hypoxic respiratory failure secondary to RSV infection with COPD exacerbation.  No definite consolidation but will continue Zithromax  for next 3 days for possible underlying bronchitis..  Procalcitonin less than 0.1.  No leukocytosis or fever.   Continue with oxygen  supplementation currently at 3 L from 6 L on initial presentation patient however feels better than yesterday.  Continue, bronchodilators, incentive spirometry on discharge.  Will continue with prednisone  for next few days on discharge.  Recommendation to follow-up with pulmonary as outpatient.  Patient has qualified for home oxygen  on discharge.    Chronic systolic/diastolic congestive heart failure, pulmonary hypertension. Review of 2D echocardiogram from 04/02/2023 with  LV ejection fraction of 45% with grade 1 diastolic dysfunction.  Cardiac catheterization done at that time showed mild coronary calcification with mild pulmonary hypertension.  Patient was recommended to be started on Farxiga  Entresto , low-dose statin, Lasix  20 mg and Coreg .  Patient currently not taking  Coreg  due to low blood pressure and.  Entresto  was discontinued due to hyperkalemia.  Will continue with Lasix  40 milligram daily, Farxiga  on discharge.  Appears to be compensated at this time.SABRA   History of COPD, mild pulmonary hypertension with mild COPD exacerbation.   Question pulmonary fibrosis from radiation.  Continue with oxygen  nebulizer steroids.  Will need to follow-up with pulmonary as outpatient.   History of anal cancer breast cancer lung cancer.  Follow-up with outpatient oncology Dr Gina Cervantes as outpatient..   Anxiety.  On Ativan  0.5 every 8 hourly.  Chronic medication from home.   Chronic anemia.  Likely secondary to anemia of malignancy.  Monitor CBC as outpatient..  Disposition.  At this time, patient is stable for disposition home with outpatient PCP pulmonary and oncology follow-up.  Medical Consultants:   None.  Procedures:    Home oxygen  Subjective:   Today, patient was seen and examined at bedside.  Wishes to go home.  Has some shortness of breath cough but much better.  Has qualified for home oxygen  on discharge.  Discharge Exam:   Vitals:   05/19/23 0351 05/19/23 0757  BP: 105/77 107/62  Pulse: 98 (!) 110  Resp: 16   Temp: 99.1 F (37.3 C) 99 F (37.2 C)  SpO2: 96% 90%   Vitals:   05/18/23 1646 05/19/23 0022 05/19/23 0351 05/19/23 0757  BP: 105/61 118/61 105/77 107/62  Pulse: (!) 106 96 98 (!) 110  Resp:  16 16   Temp: 99 F (37.2 C) 98.9 F (37.2 C) 99.1 F (37.3 C) 99 F (37.2 C)  TempSrc: Oral Oral Oral Oral  SpO2: 93% 95% 96% 90%  Weight:      Height:        General: Alert awake, not in obvious distress, on nasal cannula oxygen  HENT: pupils equally reacting to light,  No scleral pallor or icterus noted. Oral mucosa is moist.  Chest:   Diminished breath sounds bilaterally.  Coarse breath sounds noted CVS: S1 &S2 heard. No murmur.  Regular rate and rhythm. Abdomen: Soft, nontender, nondistended.  Bowel sounds are heard.    Extremities: No cyanosis, clubbing or edema.  Peripheral pulses are palpable. Psych: Alert, awake and oriented, normal mood CNS:  No cranial nerve deficits.  Power equal in all extremities.   Skin: Warm and dry.  No rashes noted.  The results of significant diagnostics from this hospitalization (including imaging, microbiology, ancillary and laboratory) are listed below for reference.     Diagnostic Studies:   CT Angio Chest PE W and/or Wo Contrast Result Date: 05/17/2023 CLINICAL DATA:  63 year old female with lung cancer and shortness of breath. EXAM: CT ANGIOGRAPHY CHEST WITH CONTRAST TECHNIQUE: Multidetector CT imaging of the chest was performed using the standard protocol during bolus administration of intravenous contrast. Multiplanar CT image reconstructions and MIPs were obtained to evaluate the vascular anatomy. RADIATION DOSE REDUCTION: This exam was performed according to the departmental dose-optimization program which includes automated exposure control, adjustment of the mA and/or kV according to patient size and/or use of iterative reconstruction technique. CONTRAST:  75mL OMNIPAQUE  IOHEXOL  350 MG/ML SOLN COMPARISON:  CTA chest 04/01/2023.  Portable chest 1044 hours today. FINDINGS: Cardiovascular: Excellent contrast bolus timing  in the pulmonary arterial tree. No pulmonary artery filling defect. Calcified aortic atherosclerosis. Calcified coronary artery atherosclerosis (series 10, image 194). Heart size remains normal. No pericardial effusion. Mediastinum/Nodes: Mediastinal lymph nodes appear stable since November, individually up to 12 mm short axis, mildly enlarged. Lungs/Pleura: Chronic pleural thickening in the right lung but no pleural effusion. Masslike peribronchial opacity in both the right upper (series 12, image 46) and right lower (series 12, image 84) lungs has not significantly changed since November. Linear and streaky extension of the lower lung opacification to the pleura  is also unchanged. Areas of pleural tenting and architectural distortion, unchanged. Stable major airways, patent although abnormally thickened in the right lung. Left upper lobe chronic postoperative changes with staple line and architectural distortion are stable. Left perihilar peribronchial nodular and irregular opacity is stable (e.g. Series 12, image 68). Similar lateral basal segment left lower lobe peribronchial opacity is stable on series 12, image 87. But a larger area of similar posterior basal segment peribronchial opacity on series 12, image 90 is new since November and is indeterminate. Upper Abdomen: Negative visible mostly noncontrast liver, gallbladder, spleen, pancreas, adrenal glands, kidneys, and bowel in the upper abdomen. Musculoskeletal: Stable. No right rib destruction identified. No acute or suspicious osseous lesion identified. Review of the MIP images confirms the above findings. IMPRESSION: 1. Negative for acute pulmonary embolus. 2. Unchanged bilateral lung abnormality since November CTA with exception of new and indeterminate. Top differential considerations in this setting are new endobronchial spread of tumor and developing bronchopneumonia. No pleural effusion. 3. Stable borderline to mildly enlarged mediastinal lymph nodes. 4.  Aortic Atherosclerosis (ICD10-I70.0). Electronically Signed   By: VEAR Hurst M.D.   On: 05/17/2023 11:55   DG Chest Port 1 View Result Date: 05/17/2023 CLINICAL DATA:  Shortness of breath.  Non-small cell lung cancer. EXAM: PORTABLE CHEST 1 VIEW COMPARISON:  Chest radiograph and CT dated 04/01/2023. FINDINGS: Similar appearance of right mid to lower lung opacity as well as additional area of opacity in the right upper lung better evaluated on the prior CT and PET. No new consolidation. Postsurgical changes of the left upper lobe. Right lung base scarring. No pneumothorax. Stable cardiac silhouette. No acute osseous pathology. IMPRESSION: No interval change.   No new consolidation. Electronically Signed   By: Vanetta Chou M.D.   On: 05/17/2023 10:55     Labs:   Basic Metabolic Panel: Recent Labs  Lab 05/17/23 1025 05/18/23 0541 05/19/23 0832  NA 133* 138 136  K 4.7 4.1 3.6  CL 94* 97* 91*  CO2 28 33* 32  GLUCOSE 105* 106* 185*  BUN 16 25* 24*  CREATININE 0.72 0.71 0.81  CALCIUM  9.0 8.7* 8.7*  MG  --  1.9 1.9  PHOS  --  5.1* 3.0   GFR Estimated Creatinine Clearance: 67.9 mL/min (by C-G formula based on SCr of 0.81 mg/dL). Liver Function Tests: Recent Labs  Lab 05/18/23 0541  AST 16  ALT 12  ALKPHOS 50  BILITOT 0.3  PROT 7.4  ALBUMIN 3.5   No results for input(s): LIPASE, AMYLASE in the last 168 hours. No results for input(s): AMMONIA in the last 168 hours. Coagulation profile No results for input(s): INR, PROTIME in the last 168 hours.  CBC: Recent Labs  Lab 05/17/23 1025 05/18/23 0541 05/19/23 0832  WBC 7.6 5.1 5.8  NEUTROABS 6.2  --   --   HGB 15.8* 14.7 14.5  HCT 46.3* 44.8 43.4  MCV 110.2* 112.3* 110.4*  PLT 237 228 222   Cardiac Enzymes: No results for input(s): CKTOTAL, CKMB, CKMBINDEX, TROPONINI in the last 168 hours. BNP: Invalid input(s): POCBNP CBG: No results for input(s): GLUCAP in the last 168 hours. D-Dimer No results for input(s): DDIMER in the last 72 hours. Hgb A1c No results for input(s): HGBA1C in the last 72 hours. Lipid Profile No results for input(s): CHOL, HDL, LDLCALC, TRIG, CHOLHDL, LDLDIRECT in the last 72 hours. Thyroid  function studies No results for input(s): TSH, T4TOTAL, T3FREE, THYROIDAB in the last 72 hours.  Invalid input(s): FREET3 Anemia work up No results for input(s): VITAMINB12, FOLATE, FERRITIN, TIBC, IRON, RETICCTPCT in the last 72 hours. Microbiology Recent Results (from the past 240 hours)  Culture, blood (routine x 2)     Status: None (Preliminary result)   Collection Time: 05/17/23 10:25 AM    Specimen: BLOOD  Result Value Ref Range Status   Specimen Description   Final    BLOOD RIGHT ANTECUBITAL Performed at [facility], [address], [place], [place] [address]    Special Requests   Final    BOTTLES DRAWN AEROBIC AND ANAEROBIC Blood Culture results may not be optimal due to an excessive volume of blood received in culture bottles Performed at [facility], [address] [place], [place] [address]    Culture   Final    NO GROWTH 2 DAYS Performed at [facility], [address], [place], [place] [address]    Report Status PENDING  Incomplete  Resp panel by RT-PCR (RSV, Flu A&B, Covid) Anterior Nasal Swab     Status: Abnormal   Collection Time: 05/17/23 11:00 AM   Specimen: Anterior Nasal Swab  Result Value Ref Range Status   SARS Coronavirus 2 by RT PCR NEGATIVE NEGATIVE Final    Comment: (NOTE) SARS-CoV-2 target nucleic acids are NOT DETECTED.  The SARS-CoV-2 RNA is generally detectable in upper respiratory specimens during the acute phase of infection. The lowest concentration of SARS-CoV-2 viral copies this assay can detect is 138 copies/mL. A negative result does not preclude SARS-Cov-2 infection and should not be used as the sole basis for treatment or other patient management decisions. A negative result may occur with  improper specimen collection/handling, submission of specimen other than nasopharyngeal swab, presence of viral mutation(s) within the areas targeted by this assay, and inadequate number of viral copies(<138 copies/mL). A negative result must be combined with clinical observations, patient history, and epidemiological information. The expected result is Negative.  Fact Sheet for Patients:  bloggercourse.com  Fact Sheet for Healthcare Providers:  seriousbroker.it  This test is no t yet approved or cleared by the [place]  FDA and  has been  authorized for detection and/or diagnosis of SARS-CoV-2 by FDA under an Emergency Use Authorization (EUA). This EUA will remain  in effect (meaning this test can be used) for the duration of the COVID-19 declaration under Section 564(b)(1) of the Act, 21 U.S.C.section 360bbb-3(b)(1), unless the authorization is terminated  or revoked sooner.       Influenza A by PCR NEGATIVE NEGATIVE Final   Influenza B by PCR NEGATIVE NEGATIVE Final    Comment: (NOTE) The Xpert Xpress SARS-CoV-2/FLU/RSV plus assay is intended as an aid in the diagnosis of influenza from Nasopharyngeal swab specimens and should not be used as a sole basis for treatment. Nasal washings and aspirates are unacceptable for Xpert Xpress SARS-CoV-2/FLU/RSV testing.  Fact Sheet for Patients: bloggercourse.com  Fact Sheet for Healthcare Providers:  seriousbroker.it  This test is not yet approved or cleared by the [place]  FDA and has been authorized for detection and/or diagnosis of SARS-CoV-2 by FDA under an Emergency Use Authorization (EUA). This EUA will remain in effect (meaning this test can be used) for the duration of the COVID-19 declaration under Section 564(b)(1) of the Act, 21 U.S.C. section 360bbb-3(b)(1), unless the authorization is terminated or revoked.     Resp Syncytial Virus by PCR POSITIVE (A) NEGATIVE Final    Comment: (NOTE) Fact Sheet for Patients: bloggercourse.com  Fact Sheet for Healthcare Providers: seriousbroker.it  This test is not yet approved or cleared by the [place]  FDA and has been authorized for detection and/or diagnosis of SARS-CoV-2 by FDA under an Emergency Use Authorization (EUA). This EUA will remain in effect (meaning this test can be used) for the duration of the COVID-19 declaration under Section 564(b)(1) of the Act, 21 U.S.C. section 360bbb-3(b)(1), unless the  authorization is terminated or revoked.  Performed at [facility], [address] [place], [place] [address]   Culture, blood (routine x 2)     Status: None (Preliminary result)   Collection Time: 05/17/23  1:10 PM   Specimen: BLOOD LEFT HAND  Result Value Ref Range Status   Specimen Description   Final    BLOOD LEFT HAND Performed at [facility], [address], [place], [place] [address]    Special Requests   Final    BOTTLES DRAWN AEROBIC AND ANAEROBIC Blood Culture results may not be optimal due to an inadequate volume of blood received in culture bottles Performed at [facility], [address] [place], [place] [address]    Culture   Final    NO GROWTH 2 DAYS Performed at [facility], [address], [place], [place] [address]    Report Status PENDING  Incomplete  MRSA Next Gen by PCR, Nasal     Status: None   Collection Time: 05/17/23  4:32 PM   Specimen: Nasal Mucosa; Nasal Swab  Result Value Ref Range Status   MRSA by PCR Next Gen NOT DETECTED NOT DETECTED Final    Comment: (NOTE) The GeneXpert MRSA Assay (FDA approved for NASAL specimens only), is one component of a comprehensive MRSA colonization surveillance program. It is not intended to diagnose MRSA infection nor to guide or monitor treatment for MRSA infections. Test performance is not FDA approved in patients less than 61 years old. Performed at [facility], [address], [place], [place] [address]   Expectorated Sputum Assessment w Gram Stain, Rflx to Resp Cult     Status: None   Collection Time: 05/17/23  6:09 PM   Specimen: Expectorated Sputum  Result Value Ref Range Status   Specimen Description EXPECTORATED SPUTUM  Final   Special Requests NONE  Final   Sputum evaluation   Final    THIS SPECIMEN IS ACCEPTABLE FOR SPUTUM CULTURE Performed at [facility], [address], [place], [place] [address]     Report Status 05/17/2023 FINAL  Final  Culture, Respiratory w Gram Stain     Status: None (Preliminary result)   Collection Time: 05/17/23  6:09 PM  Result Value Ref Range Status   Specimen Description   Final    EXPECTORATED SPUTUM Performed at [facility], [address] [place], [place] [address]    Special Requests   Final    NONE Reflexed from [phone] Performed  at [facility], [address] [place], [place] [address]    Gram Stain   Final    RARE WBC PRESENT, PREDOMINANTLY PMN RARE GRAM NEGATIVE RODS RARE GRAM POSITIVE COCCI IN CHAINS    Culture   Final    TOO YOUNG TO READ Performed at [facility], [address], [place], [place] [address]    Report Status PENDING  Incomplete     Discharge Instructions:   Discharge Instructions     Call MD for:  difficulty breathing, headache or visual disturbances   Complete by: As directed    Call MD for:  temperature >100.4   Complete by: As directed    Diet - low sodium heart healthy   Complete by: As directed    Discharge instructions   Complete by: As directed    Follow uip with your primary care provider in one week. No overexertion. Continue to use oxygen  at home. Follow up with labs in the next visit.   Increase activity slowly   Complete by: As directed       Allergies as of 05/19/2023   No Known Allergies      Medication List     STOP taking these medications    CALCIUM  PO   carvedilol  3.125 MG tablet Commonly known as: COREG        TAKE these medications    albuterol  108 (90 Base) MCG/ACT inhaler Commonly known as: VENTOLIN  HFA INHALE 2 PUFFS BY MOUTH EVERY 6 HOURS AS NEEDED FOR WHEEZING OR SHORTNESS OF BREATH What changed: See the new instructions.   azithromycin  250 MG tablet Commonly known as: ZITHROMAX  Take 1 tablet (250 mg total) by mouth daily for 3 days.   Breztri  Aerosphere 160-9-4.8 MCG/ACT Aero Generic drug:  Budeson-Glycopyrrol-Formoterol  Inhale 2 puffs into the lungs in the morning and at bedtime.   dapagliflozin  propanediol 10 MG Tabs tablet Commonly known as: FARXIGA  Take 1 tablet (10 mg total) by mouth daily.   furosemide  20 MG tablet Commonly known as: Lasix  Take 1 tablet (20 mg total) by mouth daily as needed (for weight gain of 3 lbs in 1 day or 5 lbs in 1 week).   LORazepam  0.5 MG tablet Commonly known as: ATIVAN  Take 1 tablet (0.5 mg total) by mouth every 8 (eight) hours as needed for anxiety (or nausea).   predniSONE  20 MG tablet Commonly known as: DELTASONE  Take 2 tablets (40 mg total) by mouth daily with breakfast for 4 days.               Durable Medical Equipment  (From admission, onward)           Start     Ordered   05/18/23 1350  For home use only DME oxygen   Once       Comments: POC for CHF  Question Answer Comment  Length of Need Lifetime   Mode or (Route) Nasal cannula   Liters per Minute 4   Frequency Continuous (stationary and portable oxygen  unit needed)   Oxygen  conserving device Yes   Oxygen  delivery system Gas      05/18/23 1350            Follow-up Information     Gina Elsie [name], MD Follow up in 1 week(s).   Specialty: Family Medicine Contact information: [phone] [address] [place] [address] [phone]                  Time  coordinating discharge: 39 minutes  Signed:  Lester Platas  [facility] 05/19/2023, 10:50 AM

## 2023-05-19 NOTE — Plan of Care (Signed)
  Problem: Education: Goal: Knowledge of General Education information will improve Description: Including pain rating scale, medication(s)/side effects and non-pharmacologic comfort measures Outcome: Progressing   Problem: Clinical Measurements: Goal: Ability to maintain clinical measurements within normal limits will improve Outcome: Progressing   Problem: Activity: Goal: Risk for activity intolerance will decrease Outcome: Progressing   Problem: Nutrition: Goal: Adequate nutrition will be maintained Outcome: Progressing   Problem: Pain Management: Goal: General experience of comfort will improve Outcome: Progressing   Problem: Safety: Goal: Ability to remain free from injury will improve Outcome: Progressing   Problem: Cardiac: Goal: Ability to achieve and maintain adequate cardiopulmonary perfusion will improve Outcome: Progressing

## 2023-05-20 LAB — CULTURE, RESPIRATORY W GRAM STAIN

## 2023-05-21 ENCOUNTER — Telehealth: Payer: Self-pay | Admitting: *Deleted

## 2023-05-21 NOTE — Telephone Encounter (Signed)
 Florence Iha to f/u on her appointment w/Dr. Brenna re: lung lesion. She saw him on 12/18 and told him she and her husband will discuss. She now thinks she may do the procedure but has some questions to ask first and has left message with Dr. Harriet office with these questions (he is out until 1/15). What potential fall out can occur from the procedure in regards to her COPD and prior radiation. Also does she actually have pulmonary hypertension and could the procedure worsen this?

## 2023-05-22 LAB — CULTURE, BLOOD (ROUTINE X 2)
Culture: NO GROWTH
Culture: NO GROWTH

## 2023-05-24 DIAGNOSIS — B3731 Acute candidiasis of vulva and vagina: Secondary | ICD-10-CM | POA: Diagnosis not present

## 2023-05-24 DIAGNOSIS — J210 Acute bronchiolitis due to respiratory syncytial virus: Secondary | ICD-10-CM | POA: Diagnosis not present

## 2023-06-04 ENCOUNTER — Other Ambulatory Visit (HOSPITAL_COMMUNITY)
Admission: RE | Admit: 2023-06-04 | Discharge: 2023-06-04 | Disposition: A | Discharge status: Home / Self Care | Payer: BC Managed Care – PPO | Source: Ambulatory Visit | Attending: Obstetrics & Gynecology | Admitting: Obstetrics & Gynecology

## 2023-06-04 ENCOUNTER — Ambulatory Visit (HOSPITAL_BASED_OUTPATIENT_CLINIC_OR_DEPARTMENT_OTHER): Payer: BC Managed Care – PPO | Admitting: Obstetrics & Gynecology

## 2023-06-04 ENCOUNTER — Encounter (HOSPITAL_BASED_OUTPATIENT_CLINIC_OR_DEPARTMENT_OTHER): Payer: Self-pay | Admitting: Obstetrics & Gynecology

## 2023-06-04 VITALS — BP 121/69 | HR 84 | Wt 144.8 lb

## 2023-06-04 DIAGNOSIS — N870 Mild cervical dysplasia: Secondary | ICD-10-CM | POA: Diagnosis not present

## 2023-06-04 DIAGNOSIS — R87612 Low grade squamous intraepithelial lesion on cytologic smear of cervix (LGSIL): Secondary | ICD-10-CM | POA: Diagnosis not present

## 2023-06-04 DIAGNOSIS — R87810 Cervical high risk human papillomavirus (HPV) DNA test positive: Secondary | ICD-10-CM | POA: Diagnosis not present

## 2023-06-04 NOTE — Progress Notes (Signed)
63 y.o. G39P0021 Married  female here for colposcopy with possible biopsies and/or ECC due to normal pap with +HR HPV testing.   16/18/45 testing was negative.    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  .  Patient's last menstrual period was 04/07/2008.          Sexually active: Yes.    The current method of family planning is post menopausal status.     Patient has been counseled about results and procedure.  Risks and benefits have bene reviewed including immediate and/or delayed bleeding, infection, cervical scaring from procedure, possibility of needing additional follow up as well as treatment.  Rare risks of missing a lesion discussed as well.  All questions answered.  Pt ready to proceed.  Consent obtained.  BP 121/69 (BP Location: Left Arm, Patient Position: Sitting, Cuff Size: Normal)   Pulse 84   Wt 144 lb 12.8 oz (65.7 kg)   LMP 04/07/2008   BMI 23.73 kg/m   General appearance: alert, cooperative and appears stated age Lymph nodes: No abnormal inguinal nodes palpated Neurologic: Grossly normal  Pelvic: External genitalia:  no lesions              Urethra:  normal appearing urethra with no masses, tenderness or lesions              Bartholins and Skenes: normal                 Vagina: normal appearing vagina with normal color and no discharge, no lesions               Physical Exam Constitutional:      Appearance: Normal appearance.  Genitourinary:    Labia:        Right: No lesion.        Left: No lesion.      Vagina: Normal.     Cervix: Normal.  Lymphadenopathy:     Lower Body: No right inguinal adenopathy. No left inguinal adenopathy.  Neurological:     General: No focal deficit present.     Mental Status: She is alert.  Psychiatric:        Mood and Affect: Mood normal.     Speculum placed.  3% acetic acid applied to cervix for >45 seconds.  Cervix visualized with both 7.5X and 15X magnification.  Green filter also used.   Lugols solution was used.  Findings:  No AWE, no abnormal staining with Lugols solution.  Biopsy:  none obtained.  ECC:  was performed.  Monsel's was not needed.  Excellent hemostasis was present.  Pt tolerated procedure well and all instruments were removed.   Chaperone, Ina Homes, was present during procedure.  Assessment/Plan: 1. Cervical high risk HPV (human papillomavirus) test positive (Primary - Surgical pathology( [place]/ [facility]) - Pathology results will be called to patient and follow-up planned pending results.  2. LGSIL on Pap smear of cervix

## 2023-06-06 LAB — SURGICAL PATHOLOGY

## 2023-06-14 ENCOUNTER — Encounter: Payer: Self-pay | Admitting: Emergency Medicine

## 2023-06-14 ENCOUNTER — Ambulatory Visit (INDEPENDENT_AMBULATORY_CARE_PROVIDER_SITE_OTHER): Payer: BC Managed Care – PPO | Admitting: Emergency Medicine

## 2023-06-14 VITALS — BP 106/56 | HR 87 | Ht 65.5 in | Wt 144.6 lb

## 2023-06-14 DIAGNOSIS — C3410 Malignant neoplasm of upper lobe, unspecified bronchus or lung: Secondary | ICD-10-CM

## 2023-06-14 DIAGNOSIS — R918 Other nonspecific abnormal finding of lung field: Secondary | ICD-10-CM | POA: Diagnosis not present

## 2023-06-14 NOTE — Progress Notes (Signed)
 Subjective:    Patient ID: Gina Cervantes, female    DOB: 02-Jun-1960, 63 y.o.   MRN: [id number]  HPI Pleasant 63 year old woman who has been followed by Dr. Brenna in our office.  She has a history of anal cancer 2014, breast cancer 2009, left upper lobe adenosquamous lung cancer in 2020 (diagnosed on wedge resection), adenocarcinoma of the right upper lobe diagnosed by CT biopsy and treated with SBRT.  She has been treated with systemic therapy by Dr. Cloretta.  She has some radiation induced change, radiation induced bronchiectasis on CT chest as well as several changing subsolid lesions in the left chest concerning for possible adenocarcinoma.  She had a visit with Dr. Brenna in mid December and is considering bronchoscopy for biopsies and possible pulsed electrical field ablation therapy.  She was admitted with RSV and hypoxic resp failure beginning of this month.   CT chest 05/17/2023 reviewed by me showed no evidence of PE, chronic pleural thickening, stable mediastinal nodes, stable right upper and right lower lobe parabronchial streaky opacities.  Unchanged bilateral nodules, specifically groundglass nodules in the left upper and lower lobes.   Review of Systems As per HPI  Past Medical History:  Diagnosis Date   Anal cancer (HCC) 08/09/2012   Squamous cell   Anxiety    PANIC ATTACKS   Arthritis    Breast cancer (HCC) 2009   ER+PR+HER-2+   Ductal carcinoma (HCC) 02/2008   invasive    Heart palpitations    History of radiation therapy 09/02/12-10/10/12   anal 50.4Gy   History of shingles 10/2014   HPV (human papilloma virus) anogenital infection    vulvar/ freezing hx   Hx of radiation therapy 2020   Lung nodule    Malignant neoplasm of upper lobe of left lung (HCC) 06/2016   Personal history of chemotherapy    Personal history of radiation therapy 2010   Pneumonia    NO RECENT PROBLEMS   PONV (postoperative nausea and vomiting)    Hypotension   Radiation 10/21/08-12/08/08    Right breast 6240 cGy   Shingles 2017   Status post chemotherapy 02/2008   Taxol/Herceptin     Family History  Problem Relation Age of Onset   Lung cancer Father    Stroke Mother    Breast cancer Neg Hx      Social History   Socioeconomic History   Marital status: Married    Spouse name: Not on file   Number of children: Not on file   Years of education: Not on file   Highest education level: Not on file  Occupational History    Employer: [employer], [place], [place]    Comment: Realtor  Tobacco Use   Smoking status: Former    Current packs/day: 0.00    Types: Cigarettes    Start date: 2007    Quit date: 2015    Years since quitting: 10.1   Smokeless tobacco: Never   Tobacco comments:    stopped smoking cigarettes Jan. 2018  Vaping Use   Vaping status: Never Used  Substance and Sexual Activity   Alcohol use: Not Currently    Alcohol/week: 14.0 standard drinks of alcohol    Types: 14 Standard drinks or equivalent per week   Drug use: No   Sexual activity: Yes    Partners: Male    Birth control/protection: Other-see comments, Post-menopausal    Comment: vasectomy  Other Topics Concern   Not on file  Social History  Narrative   Married   Lost a son age 24 this year and husband's brother, still going through grieving process.   No family history of breast or ovarian cancer.   Employed as Veterinary Surgeon   Social Drivers of Corporate Investment Banker Strain: Not on file  Food Insecurity: No Food Insecurity (05/17/2023)   Cervantes Vital Sign    Worried About Running Out of Food in the Last Year: Never true    Ran Out of Food in the Last Year: Never true  Transportation Needs: No Transportation Needs (05/17/2023)   PRAPARE - Administrator, Civil Service (Medical): No    Lack of Transportation (Non-Medical): No  Physical Activity: Not on file  Stress: Not on file  Social Connections: Not on file  Intimate Partner Violence: Not At Risk (05/17/2023)    Humiliation, Afraid, Rape, and Kick questionnaire    Fear of Current or Ex-Partner: No    Emotionally Abused: No    Physically Abused: No    Sexually Abused: No     No Known Allergies   Outpatient Medications Prior to Visit  Medication Sig Dispense Refill   albuterol  (VENTOLIN  HFA) 108 (90 Base) MCG/ACT inhaler INHALE 2 PUFFS BY MOUTH EVERY 6 HOURS AS NEEDED FOR WHEEZING OR SHORTNESS OF BREATH (Patient taking differently: Inhale 2 puffs into the lungs every 6 (six) hours as needed for wheezing or shortness of breath.) 8.5 g 1   Budeson-Glycopyrrol-Formoterol  (BREZTRI  AEROSPHERE) 160-9-4.8 MCG/ACT AERO Inhale 2 puffs into the lungs in the morning and at bedtime. 10.7 g 6   dapagliflozin  propanediol (FARXIGA ) 10 MG TABS tablet Take 1 tablet (10 mg total) by mouth daily. 90 tablet 3   furosemide  (LASIX ) 20 MG tablet Take 1 tablet (20 mg total) by mouth daily as needed (for weight gain of 3 lbs in 1 day or 5 lbs in 1 week). 30 tablet 0   LORazepam  (ATIVAN ) 0.5 MG tablet Take 1 tablet (0.5 mg total) by mouth every 8 (eight) hours as needed for anxiety (or nausea). 60 tablet 0   No facility-administered medications prior to visit.        Objective:   Physical Exam Vitals:   06/14/23 1509 06/14/23 1510  BP:  (!) 106/56  Pulse: 87   SpO2: 97%   Weight:  144 lb 9.6 oz (65.6 kg)  Height:  5' 5.5 (1.664 m)    Gen: Pleasant, well-nourished, in no distress,  normal affect  ENT: No lesions,  mouth clear,  oropharynx clear, no postnasal drip  Neck: No JVD, no stridor  Lungs: No use of accessory muscles, no crackles or wheezing on normal respiration, no wheeze on forced expiration  Cardiovascular: RRR, heart sounds normal, no murmur or gallops, no peripheral edema  Musculoskeletal: No deformities, no cyanosis or clubbing  Neuro: alert, awake, non focal  Skin: Warm, no lesions or rash       Assessment & Plan:  Lung nodules Left upper lobe and left lower lobe groundglass nodules  that are almost certainly adenocarcinoma given her history.  She has discussed possible pulsed electrical field ablation with Dr. Brenna.  I do not believe we will have the Galvanize study up and running for several months, so I would like for her to go to [facility] in [place] to see either Dr. Brenna or Dr. Lebron so she can discuss participation.  She does seem to be a good candidate  Time spent 30 minutes  Lamar Chris, MD, PhD  06/14/2023, 4:38 PM [facility] [phone] or if no answer before 7:00PM call [phone] For any issues after 7:00PM please call eLink [phone]

## 2023-06-14 NOTE — Patient Instructions (Signed)
 We discussed referral to [facility] in [place] to see either Dr. Lebron or Dr. Brenna to work on arranging bronchoscopy with pulsed electrical field ablation therapy.  I believe you are a great candidate for this. We will make sure Dr. Brenna, Dr. Cloretta know about the referral.

## 2023-06-14 NOTE — Assessment & Plan Note (Signed)
 Left upper lobe and left lower lobe groundglass nodules that are almost certainly adenocarcinoma given her history.  She has discussed possible pulsed electrical field ablation with Dr. Brenna.  I do not believe we will have the Galvanize study up and running for several months, so I would like for her to go to [facility] in [place] to see either Dr. Brenna or Dr. Lebron so she can discuss participation.  She does seem to be a good candidate

## 2023-06-18 DIAGNOSIS — I5082 Biventricular heart failure: Secondary | ICD-10-CM | POA: Diagnosis not present

## 2023-06-21 ENCOUNTER — Ambulatory Visit (HOSPITAL_BASED_OUTPATIENT_CLINIC_OR_DEPARTMENT_OTHER): Payer: BC Managed Care – PPO | Admitting: Obstetrics & Gynecology

## 2023-06-21 ENCOUNTER — Other Ambulatory Visit (HOSPITAL_COMMUNITY)
Admission: RE | Admit: 2023-06-21 | Discharge: 2023-06-21 | Disposition: A | Discharge status: Home / Self Care | Payer: BC Managed Care – PPO | Source: Ambulatory Visit | Attending: Obstetrics & Gynecology | Admitting: Obstetrics & Gynecology

## 2023-06-21 ENCOUNTER — Encounter (HOSPITAL_BASED_OUTPATIENT_CLINIC_OR_DEPARTMENT_OTHER): Payer: Self-pay | Admitting: Obstetrics & Gynecology

## 2023-06-21 VITALS — BP 132/79 | HR 119 | Wt 146.6 lb

## 2023-06-21 DIAGNOSIS — R87612 Low grade squamous intraepithelial lesion on cytologic smear of cervix (LGSIL): Secondary | ICD-10-CM | POA: Insufficient documentation

## 2023-06-21 DIAGNOSIS — N870 Mild cervical dysplasia: Secondary | ICD-10-CM

## 2023-06-21 DIAGNOSIS — D060 Carcinoma in situ of endocervix: Secondary | ICD-10-CM | POA: Diagnosis not present

## 2023-06-21 DIAGNOSIS — R87810 Cervical high risk human papillomavirus (HPV) DNA test positive: Secondary | ICD-10-CM | POA: Diagnosis not present

## 2023-06-21 DIAGNOSIS — D069 Carcinoma in situ of cervix, unspecified: Secondary | ICD-10-CM | POA: Diagnosis not present

## 2023-06-21 DIAGNOSIS — N72 Inflammatory disease of cervix uteri: Secondary | ICD-10-CM | POA: Diagnosis not present

## 2023-06-21 NOTE — Progress Notes (Signed)
63 y.o. Gina Cervantes Married White here for LEEP due to positive ECC showing CIN 1 noted with biopsies at colposcopy performed 06/04/2023.  Pap obtained before colposcopy showed ASCUS +HR HPV.    Patient's last menstrual period was 04/07/2008.          Sexually active: Yes.    The current method of family planning is post menopausal status.     Pre-procedure vitals: Blood pressure 132/79, pulse (!) 119, weight 146 lb 9.6 oz (66.5 kg), last menstrual period 04/07/2008.   Procedure explained and patient's questions were invited and answered.   Consent form signed.  Pre-procedure medication:  Ativan 0.5mg  and motrin 200mg .  Time given:  2:45pm  Procedure Set-up: Grounding pad located left thigh.  Cautery settings: 45 cut/45 coagulation.  Suction applied to coated speculum.  Procedure:  Speculum placed with good visualization of the cervix.  Colposcopy performed showing:  no visible lesions.  Cervix anesthetized using 2% Xylocaine with 1:100,000units Epinephrine.  6 cc's used.  Entire transition zone excised with 10 x 10cc loop in 1 passes.  ECC obtained above the LEEP specimen.  Specimen(s) placed on cork and labeled for pathology.  Hemostasis obtained with ball cautery and Monsel's solution.  EBL:  Minimal  Complications:  none.  Patient tolerated procedure well and left the office in satisfactory condition.  Assessment/Plan: 1. Cervical high risk HPV (human papillomavirus) test positive (Primary)  2. LGSIL on Pap smear of cervix  3. Dysplasia of cervix, low grade (CIN 1) - tissue will be sent to pathology - Surgical pathology( [place]/ [facility])  - Follow up 1 month for recheck - Pathology will be called to pt and follow up Pap smear planned at that time. - Post procedure instructions reviewed including pelvic rest until follow up'

## 2023-06-25 LAB — SURGICAL PATHOLOGY

## 2023-06-28 ENCOUNTER — Encounter: Payer: Self-pay | Admitting: *Deleted

## 2023-07-02 DIAGNOSIS — R918 Other nonspecific abnormal finding of lung field: Secondary | ICD-10-CM | POA: Diagnosis not present

## 2023-07-16 ENCOUNTER — Other Ambulatory Visit: Payer: Self-pay | Admitting: Pulmonary Disease

## 2023-07-16 DIAGNOSIS — I5082 Biventricular heart failure: Secondary | ICD-10-CM | POA: Diagnosis not present

## 2023-07-16 NOTE — Telephone Encounter (Signed)
 Pervious patient of Dr. Tonia Brooms  is requesting Rx, nothing  was mention in your last note with her.

## 2023-07-19 ENCOUNTER — Ambulatory Visit (HOSPITAL_BASED_OUTPATIENT_CLINIC_OR_DEPARTMENT_OTHER): Payer: BC Managed Care – PPO | Admitting: Obstetrics & Gynecology

## 2023-07-19 ENCOUNTER — Encounter (HOSPITAL_BASED_OUTPATIENT_CLINIC_OR_DEPARTMENT_OTHER): Payer: Self-pay | Admitting: Obstetrics & Gynecology

## 2023-07-19 VITALS — BP 122/66 | HR 100 | Wt 147.8 lb

## 2023-07-19 DIAGNOSIS — D069 Carcinoma in situ of cervix, unspecified: Secondary | ICD-10-CM | POA: Diagnosis not present

## 2023-07-19 DIAGNOSIS — N871 Moderate cervical dysplasia: Secondary | ICD-10-CM

## 2023-07-21 NOTE — Progress Notes (Unsigned)
 Cardiology Office Note    Date:  07/23/2023  ID:  Gina Cervantes, DOB December 28, 1960, MRN [id number] PCP:  Noberto Retort, MD  Cardiologist:  Yates Decamp, MD  Electrophysiologist:  None   Chief Complaint: routine follow-up, palpitations  History of Present Illness: Gina Cervantes is a 63 y.o. female with visit-pertinent history of anal cancer, breast cancer, lung cancer, prior radiation and chemotherapy, chronic HFmrEF/NICM, pulm HTN, possible pulmonary fibrosis from radiation, anemia, anxiety, prior hyponatremia, hyperkalemia seen for follow-up.   Remote echo 2022 EF 45-50%. She was admitted 03/2023 with leg swelling and worsening dyspnea. 2D echo showed LVEF 45%, grade I dd, D shaped septum, mod RV dysfunction, PASP 41. R/LHC showed no significant CAD, mean PA 28, PCWP 29, CI 2.12. She was diuresed. BP limited aggressive GDMT. She had hypotension into the 70s with Entresto and hyperkalemia prohibiting spironolactone use. CT was negative for PE. She was felt to have likely group III pulmonary HTN given likely pulmonary fibrosis from radiation in the past, recommended to f/u pulm as OP. She required home O2 at DC that was later weaned off. She was seen back here in follow-up 04/2023 and felt to be doing very well though had subsequent issues with hyperkalemia requiring trending, unclear if lab issue. She was readmitted 05/2023 with recurrent hypoxia felt due to RSV and COPD exacerbation with recommendation for pulmonary follow-up. CT did comment on lung abnormality with ddx endobronchial spread of tumor and developing pneumonia. Dr. Kavin Leech note from 06/2022 stated "Left upper lobe and left lower lobe groundglass nodules that are almost certainly adenocarcinoma given her history.  She has discussed possible pulsed electrical field ablation with Dr. Tonia Brooms.  I do not believe we will have the Galvanize study up and running for several months, so I would like for her to go to [facility] in  [place] to see either Dr. Tonia Brooms or Dr. Doyce Para so she can discuss participation.  She does seem to be a good candidate." She goes to [facility] 3/28.  She returns for follow-up with her husband. She noticed last week she has been having some issues with slight uptrend in weight so took Lasix for 2 days end of last week. She has also noticed elevated HR for several days. Here she is in sinus tach in the 1teens-120s. She tried Xanax without significant change. Her blood pressure is running low at 90/58, rechecked at 90/56. She denies any chest pain, SOB, fever, dizziness, near syncope, or any signs of bleeding. I told her I am concerned there is something medical going on contributing to her low BP and elevated HR. We discussed proceeding to the ER. She declines to do so. She states she cannot imagine going when she otherwise feels like she's been doing better than she has in months. She indicates plans to go on a trip to [place] on Wednesday regardless of what's going on and will be there through 3/25. She has been through so many missed holidays and events the last several months because of her health issues.   Labwork independently reviewed: 05/2023 L 3.6, Cr 0.81, BUN 24, Mg 1.9, Hgb 14.5 MCV up, plt 222, LFTs ok, BNP 107 03/2023 K 5.1, Cr 1.11, Na 132, albumin 3.2, Mg up, Hgb 17.1, plt 223, trops 20s low/flat, BNP 222 2022 TSH OK 2020 LDL 124, trig 65  ROS: .    Please see the history of present illness.  All other systems are reviewed and  otherwise negative.  Studies Reviewed: Marland Kitchen    EKG:  EKG is ordered today, personally reviewed, demonstrating  EKG Interpretation Date/Time:  Monday July 23 2023 13:45:45 EDT Ventricular Rate:  117 PR Interval:  134 QRS Duration:  84 QT Interval:  316 QTC Calculation: 440 R Axis:   113  Text Interpretation: Sinus tachycardia Right atrial enlargement Left posterior fascicular block No acute ST-TW changes Confirmed by Ronie Spies [phone]) on  07/23/2023 1:47:07 PM   CV Studies: Cardiac studies reviewed are outlined and summarized above. Otherwise please see EMR for full report.   Current Reported Medications:.    Current Meds  Medication Sig   albuterol (VENTOLIN HFA) 108 (90 Base) MCG/ACT inhaler INHALE 2 PUFFS BY MOUTH EVERY 6 HOURS AS NEEDED FOR WHEEZING OR SHORTNESS OF BREATH (Patient taking differently: Inhale 2 puffs into the lungs every 6 (six) hours as needed for wheezing or shortness of breath.)   BREZTRI AEROSPHERE 160-9-4.8 MCG/ACT AERO INHALE TWO PUFFS BY MOUTH TWICE A DAY IN THE MORNING AND IN THE EVENING   dapagliflozin propanediol (FARXIGA) 10 MG TABS tablet Take 1 tablet (10 mg total) by mouth daily.   furosemide (LASIX) 20 MG tablet Take 1 tablet (20 mg total) by mouth daily as needed (for weight gain of 3 lbs in 1 day or 5 lbs in 1 week).   LORazepam (ATIVAN) 0.5 MG tablet Take 1 tablet (0.5 mg total) by mouth every 8 (eight) hours as needed for anxiety (or nausea).    Physical Exam:    VS:  BP (!) 90/58 (BP Location: Left Arm)   Pulse (!) 122   Ht 5\' 5"  (1.651 m)   Wt 146 lb (66.2 kg)   LMP 04/07/2008   SpO2 96%   BMI 24.30 kg/m    Wt Readings from Last 3 Encounters:  07/23/23 146 lb (66.2 kg)  07/19/23 147 lb 12.8 oz (67 kg)  06/21/23 146 lb 9.6 oz (66.5 kg)    GEN: Well nourished, well developed in no acute distress NECK: No JVD; No carotid bruits CARDIAC: tachycardic, regular, no murmurs, rubs, gallops RESPIRATORY:  Clear to auscultation without rales, wheezing or rhonchi  ABDOMEN: Soft, non-tender, non-distended EXTREMITIES:  No edema; No acute deformity   Asessement and Plan:.    1. Sinus tachycardia with hypotension - no obvious localizing symptoms or exam findings. Sinus tachycardia is typically indicative of an underlying medical issue driving the HR as opposed to intrinsic cardiac issue. When she was ill in 05/2023 and required hospitalization, her HR was 90s-low 100s and SBP 100-1teens.  Today her HR is in the 120s and SBP 90/58, rechecked by me at 90/56. I recommended she proceed to ED for stat workup to include basic labs, PE evaluation and possible stat echo to exclude pericardial effusion. She refuses at this time. She verbalizes understanding of the concern of something serious going on. She is agreeable to get bloodwork. We will get stat CBC, stat d-dimer, stat CMET, with TSH and BNP as well. I told her if d-dimer is abnormal she will be advised to proceed to ED for CT imaging. If labs are not back by end of day, will sign-out to on-call team to be aware given that labs will be called to them. Stop Lasix and stop Farxiga due to hypotension. I also advised primary care follow-up. Will try to arrange ASAP echo though I told the patient I am concerned this could take several weeks to get accomplished. She reports she is going  to [place] regardless of what is going on.  2. Chronic HFmrEF, NICM - volume status looks OK today, no signs of fluid overload acutely. Follow-up echo as above. Holding Lasix/SGLT2i as above. Avoid spironolactone in the future given hyperkalemia issues.  3. Pulmonary HTN - follow-up echocardiogram as above. She remains off oxygen at this time.  4. H/o hyperkalemia - rechecking stat labs today.  5. Abnormal CT scan of lung - has ongoing f/u with pulmonology and plans for consult in [place] on 3/28.    Disposition: F/u with APP ASAP upon her return from [place]; will arrange with Rise Paganini who is familiar with her care (I am not covering [place] during needed timeframe).  Signed, Laurann Montana, PA-C

## 2023-07-22 ENCOUNTER — Other Ambulatory Visit: Payer: Self-pay | Admitting: Oncology

## 2023-07-22 NOTE — Progress Notes (Signed)
 GYNECOLOGY  VISIT  CC:   follow up after LEEP  HPI: 63 y.o. G54P0021 Married White or Caucasian female here for follow up after undergoing LEEP procedure about a month ago.  Pathology showed CIN 2/3 with positive margins and positive ECC done above LEEP.  Reports no bleeding or pain.  Declines having exam today.  She is most interested in definitive treatment for this with hysterectomy especially given her past cancer hx.  Discussed with pt I will need to review with gyn/onc due to radiation hx from anal cancer.  May need to refer pt for surgery as well.  She is comfortable with this.  If felt that it really too dangerous for risk of colon perforation, would recommend conization in the OR.  She understands this concern and appreciates the willingness to engage other providers in her care if/when needed.  She does have upcoming procedure for her lungs in [place] at the end of the month.  She will need to get through this before any other surgical planning.  She is going to update me after procedure is completed.     Past Medical History:  Diagnosis Date   Anal cancer (HCC) 08/09/2012   Squamous cell   Anxiety    PANIC ATTACKS   Arthritis    Breast cancer (HCC) 2009   ER+PR+HER-2+   Ductal carcinoma (HCC) 02/2008   invasive    Heart palpitations    History of radiation therapy 09/02/12-10/10/12   anal 50.4Gy   History of shingles 10/2014   HPV (human papilloma virus) anogenital infection    vulvar/ freezing hx   Hx of radiation therapy 2020   Lung nodule    Malignant neoplasm of upper lobe of left lung (HCC) 06/2016   Personal history of chemotherapy    Personal history of radiation therapy 2010   Pneumonia    NO RECENT PROBLEMS   PONV (postoperative nausea and vomiting)    Hypotension   Radiation 10/21/08-12/08/08   Right breast 6240 cGy   Shingles 2017   Status post chemotherapy 02/2008   Taxol/Herceptin    MEDS:   Current Outpatient Medications on File Prior to Visit   Medication Sig Dispense Refill   albuterol (VENTOLIN HFA) 108 (90 Base) MCG/ACT inhaler INHALE 2 PUFFS BY MOUTH EVERY 6 HOURS AS NEEDED FOR WHEEZING OR SHORTNESS OF BREATH (Patient taking differently: Inhale 2 puffs into the lungs every 6 (six) hours as needed for wheezing or shortness of breath.) 8.5 g 1   BREZTRI AEROSPHERE 160-9-4.8 MCG/ACT AERO INHALE TWO PUFFS BY MOUTH TWICE A DAY IN THE MORNING AND IN THE EVENING 10.7 g 6   dapagliflozin propanediol (FARXIGA) 10 MG TABS tablet Take 1 tablet (10 mg total) by mouth daily. 90 tablet 3   furosemide (LASIX) 20 MG tablet Take 1 tablet (20 mg total) by mouth daily as needed (for weight gain of 3 lbs in 1 day or 5 lbs in 1 week). 30 tablet 0   LORazepam (ATIVAN) 0.5 MG tablet Take 1 tablet (0.5 mg total) by mouth every 8 (eight) hours as needed for anxiety (or nausea). 60 tablet 0   No current facility-administered medications on file prior to visit.    ALLERGIES: Patient has no known allergies.  SH:  married, non smoker  Review of Systems  Constitutional: Negative.   Genitourinary: Negative.     PHYSICAL EXAMINATION:    BP 122/66 (BP Location: Left Arm, Patient Position: Sitting, Cuff Size: Normal)   Pulse 100  Wt 147 lb 12.8 oz (67 kg)   LMP 04/07/2008   BMI 24.22 kg/m     Physical Exam Constitutional:      Appearance: Normal appearance.  Neurological:     General: No focal deficit present.     Mental Status: She is alert.  Psychiatric:        Mood and Affect: Mood normal.    Assessment/Plan: 1. Dysplasia of cervix, high grade CIN 2 (Primary) - will discuss possible surgeries with gyn/oncology and communicate back with pt.  If risks are too high, will proceed with conization of cervix.  Pt comfortable with current plan.  2. High grade squamous intraepithelial lesion (HGSIL), grade 3 CIN, on biopsy of cervix  Total time with pt:  23 minutes Documentation time:  5 minutes Total time:  28 minutes

## 2023-07-23 ENCOUNTER — Ambulatory Visit (HOSPITAL_BASED_OUTPATIENT_CLINIC_OR_DEPARTMENT_OTHER)

## 2023-07-23 ENCOUNTER — Ambulatory Visit: Payer: BC Managed Care – PPO | Attending: Physician Assistant | Admitting: Physician Assistant

## 2023-07-23 ENCOUNTER — Encounter: Payer: Self-pay | Admitting: Physician Assistant

## 2023-07-23 VITALS — BP 90/56 | HR 122 | Ht 65.0 in | Wt 146.0 lb

## 2023-07-23 DIAGNOSIS — I428 Other cardiomyopathies: Secondary | ICD-10-CM

## 2023-07-23 DIAGNOSIS — R918 Other nonspecific abnormal finding of lung field: Secondary | ICD-10-CM | POA: Diagnosis not present

## 2023-07-23 DIAGNOSIS — R000 Tachycardia, unspecified: Secondary | ICD-10-CM

## 2023-07-23 DIAGNOSIS — I5022 Chronic systolic (congestive) heart failure: Secondary | ICD-10-CM | POA: Diagnosis not present

## 2023-07-23 DIAGNOSIS — I272 Pulmonary hypertension, unspecified: Secondary | ICD-10-CM | POA: Diagnosis not present

## 2023-07-23 DIAGNOSIS — E875 Hyperkalemia: Secondary | ICD-10-CM

## 2023-07-23 DIAGNOSIS — I959 Hypotension, unspecified: Secondary | ICD-10-CM

## 2023-07-23 LAB — PRO B NATRIURETIC PEPTIDE: NT-Pro BNP: 197 pg/mL (ref 0–287)

## 2023-07-23 LAB — COMPREHENSIVE METABOLIC PANEL
ALT: 12 IU/L (ref 0–32)
AST: 16 IU/L (ref 0–40)
Albumin: 4.2 g/dL (ref 3.8–4.9)
Alkaline Phosphatase: 78 IU/L (ref 44–121)
BUN/Creatinine Ratio: 26 (ref 12–28)
BUN: 24 mg/dL (ref 8–27)
Bilirubin Total: 0.4 mg/dL (ref 0.0–1.2)
CO2: 28 mmol/L (ref 20–29)
Calcium: 9.5 mg/dL (ref 8.7–10.3)
Chloride: 98 mmol/L (ref 96–106)
Creatinine, Ser: 0.94 mg/dL (ref 0.57–1.00)
Globulin, Total: 2.9 g/dL (ref 1.5–4.5)
Glucose: 93 mg/dL (ref 70–99)
Potassium: 4.8 mmol/L (ref 3.5–5.2)
Sodium: 139 mmol/L (ref 134–144)
Total Protein: 7.1 g/dL (ref 6.0–8.5)
eGFR: 69 mL/min/{1.73_m2} (ref >59)

## 2023-07-23 LAB — ECHOCARDIOGRAM COMPLETE
Area-P 1/2: 5.68 cm2
Height: 65 in
S' Lateral: 2.2 cm
Weight: 2336 [oz_av]

## 2023-07-23 LAB — CBC
Hematocrit: 43.5 % (ref 34.0–46.6)
Hemoglobin: 14.7 g/dL (ref 11.1–15.9)
MCH: 36 pg — ABNORMAL HIGH (ref 26.6–33.0)
MCHC: 33.8 g/dL (ref 31.5–35.7)
MCV: 107 fL — ABNORMAL HIGH (ref 79–97)
Platelets: 284 10*3/uL (ref 150–450)
RBC: 4.08 x10E6/uL (ref 3.77–5.28)
RDW: 13.1 % (ref 11.7–15.4)
WBC: 5.9 10*3/uL (ref 3.4–10.8)

## 2023-07-23 LAB — T4, FREE: Free T4: 1.2 ng/dL (ref 0.82–1.77)

## 2023-07-23 LAB — D-DIMER, QUANTITATIVE: D-DIMER: 1.02 mg{FEU}/L — ABNORMAL HIGH (ref 0.00–0.49)

## 2023-07-23 LAB — TSH: TSH: 1.56 u[IU]/mL (ref 0.450–4.500)

## 2023-07-23 NOTE — Patient Instructions (Addendum)
 Medication Instructions:  Your physician recommends that you continue on your current medications as directed. Please refer to the Current Medication list given to you today.  Your physician recommends that you continue on your current medications as directed. Please refer to the Current Medication list given to you today.   *If you need a refill on your cardiac medications before your next appointment, please call your pharmacy*   Lab Work: TODAY:  STAT CMET, STAT CBC, STAT D-DIMER, PRO BNP, TSH, & FTF   If you have labs (blood work) drawn today and your tests are completely normal, you will receive your results only by: MyChart Message (if you have MyChart) OR A paper copy in the mail If you have any lab test that is abnormal or we need to change your treatment, we will call you to review the results.   Testing/Procedures: Your physician has requested that you have an echocardiogram ASAP. Echocardiography is a painless test that uses sound waves to create images of your heart. It provides your doctor with information about the size and shape of your heart and how well your heart's chambers and valves are working. This procedure takes approximately one hour. There are no restrictions for this procedure. Please do NOT wear cologne, perfume, aftershave, or lotions (deodorant is allowed). Please arrive 15 minutes prior to your appointment time.  Please note: We ask at that you not bring children with you during ultrasound (echo/ vascular) testing. Due to room size and safety concerns, children are not allowed in the ultrasound rooms during exams. Our front office staff cannot provide observation of children in our lobby area while testing is being conducted. An adult accompanying a patient to their appointment will only be allowed in the ultrasound room at the discretion of the ultrasound technician under special circumstances. We apologize for any inconvenience.    Follow-Up: At [facility], you and your health needs are our priority.  As part of our continuing mission to provide you with exceptional heart care, we have created designated Provider Care Teams.  These Care Teams include your primary Cardiologist (physician) and Advanced Practice Providers (APPs -  Physician Assistants and Nurse Practitioners) who all work together to provide you with the care you need, when you need it.  We recommend signing up for the patient portal called "MyChart".  Sign up information is provided on this After Visit Summary.  MyChart is used to connect with patients for Virtual Visits (Telemedicine).  Patients are able to view lab/test results, encounter notes, upcoming appointments, etc.  Non-urgent messages can be sent to your provider as well.   To learn more about what you can do with MyChart, go to [email or web address].    Your next appointment:   As scheduled   Provider:   Rise Paganini, NP  Other Instructions     1st Floor: - Lobby - Registration  - Pharmacy  - Lab - Cafe  2nd Floor: - PV Lab - Diagnostic Testing (echo, CT, nuclear med)  3rd Floor: - Vacant  4th Floor: - TCTS (cardiothoracic surgery) - AFib Clinic - Structural Heart Clinic - Vascular Surgery  - Vascular Ultrasound  5th Floor: - [facility] (general and EP) - Clinical Pharmacy for coumadin, hypertension, lipid, weight-loss medications, and med management appointments    Valet parking services will be available as well.

## 2023-07-24 NOTE — Telephone Encounter (Signed)
Pt called back .    Please give her a call

## 2023-07-24 NOTE — Telephone Encounter (Signed)
 Hey there, saw you were working on this, apologies if I shouldn't be routing back to you

## 2023-07-30 ENCOUNTER — Telehealth: Payer: Self-pay | Admitting: Cardiology

## 2023-07-30 DIAGNOSIS — I272 Pulmonary hypertension, unspecified: Secondary | ICD-10-CM

## 2023-07-30 DIAGNOSIS — I5022 Chronic systolic (congestive) heart failure: Secondary | ICD-10-CM

## 2023-07-30 DIAGNOSIS — I428 Other cardiomyopathies: Secondary | ICD-10-CM

## 2023-07-30 NOTE — Telephone Encounter (Addendum)
 The CT we had recommended was a stat CT angio to rule out PE based on tachycardia/hypotension and elevated d-dimer. Would arrange please. If unable to get today, needs to go to ED if having more SOB. Sent secure chat back to [facility] for context/recommendation as well. Thank you.

## 2023-07-30 NOTE — Telephone Encounter (Signed)
 Pt is requesting a callback regarding her being advised to have a CT done before she left to go out of town at her last office visit with Ronie Spies but pt declined. Now pt is wanting to have that CT done to rule out a blood clot in her lungs. Please advise

## 2023-07-30 NOTE — Telephone Encounter (Signed)
 Patient reports that when she saw Dayna a few weeks ago she recommended a CT scan. The patient declined due to going on vacation. She is on her way back and would like to schedule CT scan now. She does report that she stopped [place] as advised and on Saturday she started to have more shortness of breath again. She has an appointment with Rise Paganini, NP on Wednesday 3/26. Patient is aware of ER precautions in the meantime.

## 2023-07-30 NOTE — Telephone Encounter (Signed)
 Spoke with the patient and advised on recommendations from [facility]. STAT chest CTA has been ordered. Patient is unable to have it done today due to travel but will be back in town tomorrow. She states that her symptoms don't feel like symptoms that would typically be associated with a blood clot. She is more concerned that symptoms are coming from being off of [place]

## 2023-08-01 ENCOUNTER — Ambulatory Visit: Attending: Emergency Medicine | Admitting: Emergency Medicine

## 2023-08-01 ENCOUNTER — Ambulatory Visit: Admitting: Nurse Practitioner

## 2023-08-01 NOTE — Progress Notes (Deleted)
 Cardiology Office Note:    Date:  08/01/2023  ID:  Gina Cervantes, DOB 08/24/1960, MRN [id number] PCP: Noberto Retort, MD  [facility] Providers Cardiologist:  Yates Decamp, MD { Click to update primary MD,subspecialty MD or APP then REFRESH:1}    {Click to Open Review  :1}   Patient Profile:      Chief Complaint: *** History of Present Illness:  Gina Cervantes is a 63 y.o. female with visit-pertinent history of anal cancer, breast cancer, lung cancer, prior radiation and chemotherapy, chronic HFmrEF/NICM, pulm HTN, possible pulmonary fibrosis from radiation, anemia, anxiety, prior hyponatremia, hyperkalemia    Remote echo 2022 EF 45-50%. She was admitted 03/2023 with leg swelling and worsening dyspnea. 2D echo showed LVEF 45%, grade I dd, D shaped septum, mod RV dysfunction, PASP 41. R/LHC showed no significant CAD, mean PA 28, PCWP 29, CI 2.12. She was diuresed. BP limited aggressive GDMT. She had hypotension into the 70s with Entresto and hyperkalemia prohibiting spironolactone use. CT was negative for PE. She was felt to have likely group III pulmonary HTN given likely pulmonary fibrosis from radiation in the past, recommended to f/u pulm as OP. She required home O2 at [place] that was later weaned off.  She was started on Carvedilol 3.125 mg twice daily and Farxiga 10 mg once daily.   She was seen in clinic on 04/19/2023.  She was doing well overall without acute cardiovascular concerns or complaints.  Her GDMT Farxiga 10 mg, Carvedilol 3.125 mg twice daily, Lasix 20 mg as needed was continued.  Subsequently her BMET showed hyperkalemia that required trending (5.3, 5.7, 5.7, 6). Presented to the ED on 05/08/2023 for hyperkalemia, BMET showed normal potassium, unclear if lab issue. Discharged home with no changes.   She was readmitted on 05/2023 with recurrent hypoxia due to RSV and COPD exacerbation with recommendation for pulmonary follow-up. CT did comment on lung abnormality with  ddx endobronchial spread of tumor and developing pneumonia. Dr. Kavin Leech note from 06/2022 stated "Left upper lobe and left lower lobe groundglass nodules that are almost certainly adenocarcinoma given her history.  She has discussed possible pulsed electrical field ablation with Dr. Tonia Brooms.  I do not believe we will have the Galvanize study up and running for several months, so I would like for her to go to [facility] in [place] to see either Dr. Tonia Brooms or Dr. Doyce Para so she can discuss participation.  She does seem to be a good candidate." She goes to [facility] 3/28.   She was last seen in clinic on 07/23/2023 by [place], PA.  She noticed that she been having some issues with slight uptrend in weight so took Lasix for 2 days.  She had also noticed elevated heart rate for several days.  Her heart rate was in the 110s to 120s.  She did try Xanax without significant change.  Her blood pressure was running low at 90/58, rechecked at 90/56.  It was discussed proceeding to the ER however she declined to do so.  Her Lasix and Marcelline Deist were stopped due to hypotension.  Stat lab work was completed showing elevated D-dimer 1.02.  CT angio chest PE was ordered however patient politely declined.  Stat echocardiogram performed on 07/23/2023 showing normalized LVEF of 60-65%, no RWMA, grade 1 diastolic dysfunction, RV function normal, RV size mildly enlarged, right atrial size mildly dilated, no valvular abnormalities.  Discussed the use of AI scribe software for clinical note transcription with the patient, who gave  verbal consent to proceed.  History of Present Illness            Review of systems:  Please see the history of present illness. All other systems are reviewed and otherwise negative. ***     Home Medications:    No outpatient medications have been marked as taking for the 08/01/23 encounter (Appointment) with Denyce Robert, NP.   Studies Reviewed:       *** Risk Assessment/Calculations:    {Does this patient have ATRIAL FIBRILLATION?[phone]} No BP recorded.  {Refresh Note OR Click here to enter BP  :1}***       Physical Exam:   VS:  LMP 04/07/2008    Wt Readings from Last 3 Encounters:  07/23/23 146 lb (66.2 kg)  07/19/23 147 lb 12.8 oz (67 kg)  06/21/23 146 lb 9.6 oz (66.5 kg)    GEN: Well nourished, well developed in no acute distress NECK: No JVD; No carotid bruits CARDIAC: ***RRR, no murmurs, rubs, gallops RESPIRATORY:  Clear to auscultation without rales, wheezing or rhonchi  ABDOMEN: Soft, non-tender, non-distended EXTREMITIES:  No edema; No acute deformity ***     Assessment and Plan:  Assessment and Plan              Informed Consent   Shared Decision Making/Informed Consent{ All outpatient stress tests require an informed consent ([id number]) ATTESTATION ORDER       :[id number]} {Press F2 to document informed consent                       :[id number]}     Dispo:  No follow-ups on file.  Signed, Denyce Robert, NP

## 2023-08-02 ENCOUNTER — Other Ambulatory Visit (HOSPITAL_BASED_OUTPATIENT_CLINIC_OR_DEPARTMENT_OTHER): Payer: Self-pay | Admitting: Obstetrics & Gynecology

## 2023-08-02 DIAGNOSIS — D069 Carcinoma in situ of cervix, unspecified: Secondary | ICD-10-CM

## 2023-08-02 DIAGNOSIS — N871 Moderate cervical dysplasia: Secondary | ICD-10-CM

## 2023-08-06 ENCOUNTER — Ambulatory Visit
Admission: RE | Admit: 2023-08-06 | Discharge: 2023-08-06 | Disposition: A | Discharge status: Home / Self Care | Source: Ambulatory Visit | Attending: Physician Assistant | Admitting: Physician Assistant

## 2023-08-06 ENCOUNTER — Telehealth: Payer: Self-pay | Admitting: Cardiology

## 2023-08-06 DIAGNOSIS — I272 Pulmonary hypertension, unspecified: Secondary | ICD-10-CM

## 2023-08-06 DIAGNOSIS — Z85118 Personal history of other malignant neoplasm of bronchus and lung: Secondary | ICD-10-CM | POA: Diagnosis not present

## 2023-08-06 DIAGNOSIS — I5022 Chronic systolic (congestive) heart failure: Secondary | ICD-10-CM

## 2023-08-06 DIAGNOSIS — R918 Other nonspecific abnormal finding of lung field: Secondary | ICD-10-CM | POA: Diagnosis not present

## 2023-08-06 DIAGNOSIS — I428 Other cardiomyopathies: Secondary | ICD-10-CM

## 2023-08-06 MED ORDER — AMOXICILLIN-POT CLAVULANATE 875-125 MG PO TABS: 1.0000 | ORAL_TABLET | Freq: Two times a day (BID) | ORAL | 0 refills | Status: DC

## 2023-08-06 MED ORDER — IOPAMIDOL (ISOVUE-370) INJECTION 76%
75.0000 mL | Freq: Once | INTRAVENOUS | Status: AC | PRN
  Administered 2023-08-06: 75 mL via INTRAVENOUS

## 2023-08-06 NOTE — Telephone Encounter (Signed)
 [facility] called to report critical results CT Angio Pulmonary Embolism.  Called to report Impression: IMPRESSION: 1. Increased density of two mixed density pulmonary nodules in LEFT upper lobe is concerning for progression of metastatic lung cancer. Cannot exclude small foci of pulmonary infection. Consider bronchoscopy if clinically warranted. 2. Stable parenchymal masses in the RIGHT lung consistent with lung carcinoma. 3. No evidence of pulmonary embolism.  Will send to ordering provider to f/u.   Per chart review pt is already followed by Oncology.

## 2023-08-06 NOTE — Telephone Encounter (Signed)
 Calling with results. Please advise

## 2023-08-06 NOTE — Telephone Encounter (Addendum)
 I called patient to discuss the increased density of pulmonary nodules concerning for progression of lung cancer mets, the stable masses in the right lung, and no evidence of PE. She verbalized understanding. No pleural fluid noted. As per previous results, recent BNP and echo were reassuring. I think her SOB she has had is related to her significant pulmonary findings rather than cardiac disease given reassuring updated cardiac workup. She shares that she did go on her trip to [place] and felt well except upon returning both she and her husband fell ill with URI symptoms starting 3/23. She had fevers on/off for a few days and states that if she had not had her home O2 at home already (which she was previously able to go without) she would have had to go to ER. POx was 84%, running 94-95% on 2L. The fevers have resolved but productive cough, malaise, sinus congestion remain. She has continued to use her O2 with stable pulse ox. She had to defer the follow-up with our office and with pulmonology in [place] due to her illness. Her bronch/EBUS/ablation are rescheduled for 08/22/23. She does not want to go to an emergency room for care. I discussed her case with Rubye Oaks, NP, with [facility] since Dr. Delton Coombes is out of the office today. I will start the patient on Augmentin 875/125mg  BID x 10 days and they will see her back in follow-up later this week on Friday at 11:30am. Rx sent into [facility] per pt request. Patient was appreciative for call and coordination of care. We are both thankful for our pulmonary colleagues' assistance with her care. The patient will call back to reschedule follow-up with [facility] team when she is feeling better. In the meantime she will continue to remain off Farxiga and Lasix as these were stopped due to hypotension at last OV (was only taking Lasix PRN anyway before). I will cc copy of this msg to Dr. Delton Coombes as Lorain Childes.  TRIAGE TEAM: I took care of all the above, but can  you please call Dr. Casimiro Needle Pritchett's office in [place] to relay the updated CT angio chest report from today and obtain fax number to ensure they get a copy as well? It looks like they are planning a CT scan around the time of her upcoming procedure in April so just wanted to make them aware this one was done. Thank you.

## 2023-08-06 NOTE — Telephone Encounter (Signed)
 Thank you.  Will review the films and her status with antibiotics

## 2023-08-10 ENCOUNTER — Ambulatory Visit (INDEPENDENT_AMBULATORY_CARE_PROVIDER_SITE_OTHER): Admitting: Adult Health

## 2023-08-10 VITALS — BP 100/58 | HR 94 | Temp 98.4°F | Ht 65.0 in | Wt 145.8 lb

## 2023-08-10 DIAGNOSIS — R918 Other nonspecific abnormal finding of lung field: Secondary | ICD-10-CM | POA: Diagnosis not present

## 2023-08-10 DIAGNOSIS — J9611 Chronic respiratory failure with hypoxia: Secondary | ICD-10-CM

## 2023-08-10 DIAGNOSIS — C3410 Malignant neoplasm of upper lobe, unspecified bronchus or lung: Secondary | ICD-10-CM | POA: Diagnosis not present

## 2023-08-10 DIAGNOSIS — J4489 Other specified chronic obstructive pulmonary disease: Secondary | ICD-10-CM

## 2023-08-10 DIAGNOSIS — I5042 Chronic combined systolic (congestive) and diastolic (congestive) heart failure: Secondary | ICD-10-CM

## 2023-08-10 NOTE — Progress Notes (Signed)
 @Patient  ID: Gina Cervantes, female    DOB: 1961/05/01, 63 y.o.   MRN: [id number]  Chief Complaint  Patient presents with   Acute Visit    Referring provider: Noberto Retort, MD  HPI: 63 yo female former smoker -previous patient of Dr. Tonia Brooms.  Followed for COPD with asthma . 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  Previous chemo and radiation.  Has radiation changes with bronchiectasis on CT chest. Seen for new lung lesions on June 14, 2023.  CT chest on May 17, 2023 showed no PE.  Chronic pleural thickening.  Stable mediastinal nodes.  Stable right upper lobe and right lower lobe opacities.  Unchanged bilateral nodules in the left upper and lower lobes. Medical history significant for congestive heart failure, pulmonary hypertension, chronic respiratory failure on home oxygen  08/10/2023 Acute OV  : COPD , Lung cancer  Patient presents for an acute office visit.  Patient was referred over by cardiology.  She developed acute respiratory symptoms around 2 weeks ago.  Was on vacation in [place].  She developed fever, cough and congestion on March 25.  Her husband developed similar symptoms 1 day prior to her.  Patient had CT chest August 06, 2023 showing increased density nodules in the left upper lobe concerning for progressive metastatic lung cancer.  Stable right lung masses, no evidence of pulmonary embolism.  Patient had ongoing cough and congestion.  She was started on Augmentin x 10 days.  Patient states she is feeling better.  She denies any hemoptysis or unintentional weight loss.  She remains on Breztri twice daily.  Previous PFTs in 2018 showed moderate restriction with bronchodilator reversibility.  She denies any significant wheezing.  Appetite is good with no nausea vomiting or  diarrhea. Patient has been scheduled for evaluation at [facility] with Dr. Tonia Brooms for bronchoscopy with pulsed electrical field ablation therapy.  She says she had to reschedule her appointment due to illness.  She is currently waiting for new schedule this month.  She was admitted in January with acute on chronic hypoxic respiratory failure in the setting of RSV infection with COPD exacerbation.  She is followed by cardiology with chronic systolic and diastolic heart failure.  And mild pulmonary hypertension.  Patient was admitted in November with EF at 45%.  Cardiac cath showed mild coronary calcifications with mild pulmonary hypertension.  She was treated with Clifton Custard, Coreg and furosemide.  Coreg was stopped due to hypotension.  Sherryll Burger was stopped due to hyperkalemia.  Recently taken off of Farxiga and Lasix.  Follow-up echo on July 23, 2023 showed improvement with normalization of EF at 60 to 65%, grade 1 diastolic dysfunction.  Right ventricular systolic function normal, right ventricular size mildly enlarged.  Patient denies any increased leg swelling or orthopnea.  She was started on oxygen in November 2024.  She says she uses this with activity at times.  Has noticed over the last week that she does drop her oxygen's with walking.  Has portable oxygen that she uses when she is away from home.  O2 saturations today in the office 84% on room air.  On O2 at 2 L O2 saturations greater than 90%.  At rest on room air oxygen levels at 100%.    No Known Allergies  Immunization History  Administered Date(s) Administered   DTaP 08/22/1999, 08/21/2008   Influenza Inj Mdck Quad Pf 02/28/2016, 03/23/2017   Influenza Split 05/14/2020   Influenza, Seasonal, Injecte, Preservative Fre 03/15/2023   Influenza,inj,Quad PF,6+ Mos 04/02/2018, 02/21/2019, 02/22/2022   Influenza-Unspecified 02/06/2016, 03/23/2017, 04/18/2018   PFIZER(Purple Top)SARS-COV-2 Vaccination 07/21/2019,  08/11/2019, 01/05/2020   Pfizer Covid-19 Vaccine Bivalent Booster 75yrs & up 01/02/2022   Pfizer(Comirnaty)Fall Seasonal Vaccine 12 years and older 05/23/2022, 03/29/2023    Past Medical History:  Diagnosis Date   Anal cancer (HCC) 08/09/2012   Squamous cell   Anxiety    PANIC ATTACKS   Arthritis    Breast cancer (HCC) 2009   ER+PR+HER-2+   Ductal carcinoma (HCC) 02/2008   invasive    Heart palpitations    History of radiation therapy 09/02/12-10/10/12   anal 50.4Gy   History of shingles 10/2014   HPV (human papilloma virus) anogenital infection    vulvar/ freezing hx   Hx of radiation therapy 2020   Lung nodule    Malignant neoplasm of upper lobe of left lung (HCC) 06/2016   Personal history of chemotherapy    Personal history of radiation therapy 2010   Pneumonia    NO RECENT PROBLEMS   PONV (postoperative nausea and vomiting)    Hypotension   Radiation 10/21/08-12/08/08   Right breast 6240 cGy   Shingles 2017   Status post chemotherapy 02/2008   Taxol/Herceptin    Tobacco History: Social History   Tobacco Use  Smoking Status Former   Current packs/day: 0.00   Types: Cigarettes   Start date: 2007   Quit date: 2015   Years since quitting: 10.2  Smokeless Tobacco Never  Tobacco Comments   stopped smoking cigarettes Jan. 2018   Counseling given: Not Answered Tobacco comments: stopped smoking cigarettes Jan. 2018   Outpatient Medications Prior to Visit  Medication Sig Dispense Refill   albuterol (VENTOLIN HFA) 108 (90 Base) MCG/ACT inhaler INHALE 2 PUFFS BY MOUTH EVERY 6 HOURS AS NEEDED FOR WHEEZING OR SHORTNESS OF BREATH 8.5 g 1   amoxicillin-clavulanate (AUGMENTIN) 875-125 MG tablet Take 1 tablet by mouth 2 (two) times daily. 20 tablet 0   BREZTRI AEROSPHERE 160-9-4.8 MCG/ACT AERO INHALE TWO PUFFS BY MOUTH TWICE A DAY IN THE MORNING AND IN THE EVENING 10.7 g 6   LORazepam (ATIVAN) 0.5 MG tablet Take 1 tablet (0.5 mg total) by mouth every 8 (eight) hours as  needed for anxiety (or nausea). 60 tablet 0   No facility-administered medications prior to visit.     Review of Systems:   Constitutional:   No  weight loss, night sweats,  Fevers, chills, +fatigue, or  lassitude.  HEENT:   No headaches,  Difficulty swallowing,  Tooth/dental problems, or  Sore throat,                No sneezing, itching, ear ache, nasal congestion, post nasal drip,   CV:  No chest pain,  Orthopnea, PND, swelling in lower extremities, anasarca, dizziness, palpitations, syncope.   GI  No heartburn, indigestion, abdominal pain, nausea, vomiting, diarrhea, change in bowel habits, loss of appetite, bloody stools.   Resp: .  No chest wall deformity  Skin: no rash or lesions.  GU: no dysuria, change in color of urine, no urgency or frequency.  No flank pain, no hematuria   MS:  No joint pain or swelling.  No decreased range of motion.  No back pain.    Physical Exam  BP (!) 100/58 (BP  Location: Left Arm, Patient Position: Sitting, Cuff Size: Normal)   Pulse 94   Temp 98.4 F (36.9 C) (Oral)   Ht 5\' 5"  (1.651 m)   Wt 145 lb 12.8 oz (66.1 kg)   LMP 04/07/2008   SpO2 100% Comment: with rest/no oxygen  BMI 24.26 kg/m   GEN: A/Ox3; pleasant , NAD, well nourished    HEENT:  [place]/AT,   NOSE-clear, THROAT-clear, no lesions, no postnasal drip or exudate noted.   NECK:  Supple w/ fair ROM; no JVD; normal carotid impulses w/o bruits; no thyromegaly or nodules palpated; no lymphadenopathy.    RESP  Clear  P & A; w/o, wheezes/ rales/ or rhonchi. no accessory muscle use, no dullness to percussion  CARD:  RRR, no m/r/g, no peripheral edema, pulses intact, no cyanosis or clubbing.  GI:   Soft & nt; nml bowel sounds; no organomegaly or masses detected.   Musco: Warm bil, no deformities or joint swelling noted.   Neuro: alert, no focal deficits noted.    Skin: Warm, no lesions or rashes    Lab Results:  CBC    Component Value Date/Time   WBC 5.9 07/23/2023 1703    WBC 5.8 05/19/2023 0832   RBC 4.08 07/23/2023 1703   RBC 3.93 05/19/2023 0832   HGB 14.7 07/23/2023 1703   HGB 13.5 01/02/2013 1510   HCT 43.5 07/23/2023 1703   HCT 39.1 01/02/2013 1510   PLT 284 07/23/2023 1703   MCV 107 (H) 07/23/2023 1703   MCV 105.7 (H) 01/02/2013 1510   MCH 36.0 (H) 07/23/2023 1703   MCH 36.9 (H) 05/19/2023 0832   MCHC 33.8 07/23/2023 1703   MCHC 33.4 05/19/2023 0832   RDW 13.1 07/23/2023 1703   RDW 13.6 01/02/2013 1510   LYMPHSABS 0.4 (L) 05/17/2023 1025   LYMPHSABS 1.6 01/02/2013 1510   MONOABS 0.8 05/17/2023 1025   MONOABS 0.7 01/02/2013 1510   EOSABS 0.1 05/17/2023 1025   EOSABS 0.2 01/02/2013 1510   BASOSABS 0.1 05/17/2023 1025   BASOSABS 0.0 01/02/2013 1510    BMET    Component Value Date/Time   NA 139 07/23/2023 1703   NA 139 12/03/2012 1316   K 4.8 07/23/2023 1703   K 4.3 12/03/2012 1316   CL 98 07/23/2023 1703   CL 99 10/10/2012 1315   CO2 28 07/23/2023 1703   CO2 27 12/03/2012 1316   GLUCOSE 93 07/23/2023 1703   GLUCOSE 185 (H) 05/19/2023 0832   GLUCOSE 101 12/03/2012 1316   GLUCOSE 123 (H) 10/10/2012 1315   BUN 24 07/23/2023 1703   BUN 10.8 12/03/2012 1316   CREATININE 0.94 07/23/2023 1703   CREATININE 0.98 08/08/2021 1440   CREATININE 0.71 04/07/2016 1621   CREATININE 0.7 12/03/2012 1316   CALCIUM 9.5 07/23/2023 1703   CALCIUM 8.9 12/03/2012 1316   GFRNONAA >60 05/19/2023 0832   GFRNONAA >60 08/08/2021 1440   GFRAA >60 01/29/2020 1010    BNP    Component Value Date/Time   BNP 107.1 (H) 05/17/2023 1025    ProBNP    Component Value Date/Time   PROBNP 197 07/23/2023 1703    Imaging: CT Angio Chest Pulmonary Embolism (PE) W or WO Contrast Result Date: 08/06/2023 CLINICAL DATA:  Pulmonary embolism suspected. High probability. History of lung cancer. * Tracking Code: BO * EXAM: CT ANGIOGRAPHY CHEST WITH CONTRAST TECHNIQUE: Multidetector CT imaging of the chest was performed using the standard protocol during bolus  administration of intravenous contrast. Multiplanar CT image reconstructions and  MIPs were obtained to evaluate the vascular anatomy. RADIATION DOSE REDUCTION: This exam was performed according to the departmental dose-optimization program which includes automated exposure control, adjustment of the mA and/or kV according to patient size and/or use of iterative reconstruction technique. CONTRAST:  75mL ISOVUE-370 IOPAMIDOL (ISOVUE-370) INJECTION 76% COMPARISON:  CT a 05/17/2023 FINDINGS: Cardiovascular: No filling defects within the pulmonary arteries to suggest acute pulmonary embolism. Mediastinum/Nodes: Small paratracheal lymph nodes similar prior. Lungs/Pleura: Two nodular densities with peripheral ground-glass density in the LEFT upper lobe are increased from prior. Lesion measuring 19 mm on image 72/5 and image lesion measuring 17 mm on image 72/5. These are increased in central density compared to prior. RIGHT central peribronchial mass measuring 3.4 x 3.1 cm similar prior. Peripheral density in the RIGHT upper lobe is similar prior. Upper lobe peribronchial density on the RIGHT measuring 4.1 cm similar prior. Upper Abdomen: Limited view of the liver, kidneys, pancreas are unremarkable. Normal adrenal glands. Musculoskeletal: No aggressive osseous lesion. Review of the MIP images confirms the above findings. IMPRESSION: 1. Increased density of two mixed density pulmonary nodules in LEFT upper lobe is concerning for progression of metastatic lung cancer. Cannot exclude small foci of pulmonary infection. Consider bronchoscopy if clinically warranted. 2. Stable parenchymal masses in the RIGHT lung consistent with lung carcinoma. 3. No evidence of pulmonary embolism. These results will be called to the ordering clinician or representative by the Radiologist Assistant, and communication documented in the PACS or [employer]. Electronically Signed   By: Genevive Bi M.D.   On: 08/06/2023 12:32    ECHOCARDIOGRAM COMPLETE Result Date: 07/23/2023    ECHOCARDIOGRAM REPORT   Patient Name:   JOELLE ROSWELL Date of Exam: 07/23/2023 Medical Rec #:  [id number]         Height:       65.0 in Accession #:    [id number]        Weight:       146.0 lb Date of Birth:  Jul 16, 1960          BSA:          1.730 m Patient Age:    62 years          BP:           90/56 mmHg Patient Gender: F                 HR:           111 bpm. Exam Location:  [address] Procedure: 2D Echo, 3D Echo, Cardiac Doppler and Color Doppler (Both Spectral            and Color Flow Doppler were utilized during procedure). Indications:    I95.9 Hypotension  History:        Patient has prior history of Echocardiogram examinations, most                 recent 04/02/2023. CHF, Pulmonary HTN, Arrythmias:Tachycardia,                 Signs/Symptoms:Hypotension; Risk Factors:Former Smoker.                 Pulmonary Fibrosis likely from radiation, History of Breast                 Cancer (2010), Anal Cancer (2014), and Lung Cancer (left, 2022,                 Status post Left Lobectomy), Chemotherapy and Radiation.  Sonographer:    Farrel Conners RDCS Referring Phys: Laurann Montana IMPRESSIONS  1. Left ventricular ejection fraction, by estimation, is 60 to 65%. Left ventricular ejection fraction by 3D volume is 59 %. The left ventricle has normal function. The left ventricle has no regional wall motion abnormalities. Left ventricular diastolic  parameters are consistent with Grade I diastolic dysfunction (impaired relaxation).  2. Right ventricular systolic function is normal. The right ventricular size is mildly enlarged.  3. Right atrial size was mildly dilated.  4. The mitral valve is normal in structure. No evidence of mitral valve regurgitation. No evidence of mitral stenosis.  5. The aortic valve is normal in structure. Aortic valve regurgitation is not visualized. No aortic stenosis is present.  6. The inferior vena cava is normal in size with  greater than 50% respiratory variability, suggesting right atrial pressure of 3 mmHg. FINDINGS  Left Ventricle: Left ventricular ejection fraction, by estimation, is 60 to 65%. Left ventricular ejection fraction by 3D volume is 59 %. The left ventricle has normal function. The left ventricle has no regional wall motion abnormalities. The left ventricular internal cavity size was normal in size. There is no left ventricular hypertrophy. Left ventricular diastolic parameters are consistent with Grade I diastolic dysfunction (impaired relaxation). Right Ventricle: The right ventricular size is mildly enlarged. No increase in right ventricular wall thickness. Right ventricular systolic function is normal. Left Atrium: Left atrial size was normal in size. Right Atrium: Right atrial size was mildly dilated. Pericardium: There is no evidence of pericardial effusion. Mitral Valve: The mitral valve is normal in structure. No evidence of mitral valve regurgitation. No evidence of mitral valve stenosis. Tricuspid Valve: The tricuspid valve is normal in structure. Tricuspid valve regurgitation is not demonstrated. No evidence of tricuspid stenosis. Aortic Valve: The aortic valve is normal in structure. Aortic valve regurgitation is not visualized. No aortic stenosis is present. Pulmonic Valve: The pulmonic valve was normal in structure. Pulmonic valve regurgitation is not visualized. No evidence of pulmonic stenosis. Aorta: The aortic root is normal in size and structure. Venous: The inferior vena cava is normal in size with greater than 50% respiratory variability, suggesting right atrial pressure of 3 mmHg. IAS/Shunts: No atrial level shunt detected by color flow Doppler. Additional Comments: 3D was performed not requiring image post processing on an independent workstation and was normal.  LEFT VENTRICLE PLAX 2D LVIDd:         3.40 cm         Diastology LVIDs:         2.20 cm         LV e' medial:    10.20 cm/s LV PW:          0.90 cm         LV E/e' medial:  8.3 LV IVS:        0.70 cm         LV e' lateral:   9.32 cm/s LVOT diam:     2.00 cm         LV E/e' lateral: 9.1 LV SV:         44 LV SV Index:   26 LVOT Area:     3.14 cm        3D Volume EF                                LV 3D EF:  Left                                             ventricul                                             ar                                             ejection                                             fraction                                             by 3D                                             volume is                                             59 %.                                 3D Volume EF:                                3D EF:        59 %                                LV EDV:       94 ml                                LV ESV:       38 ml                                LV SV:        56 ml RIGHT VENTRICLE RV Basal diam:  4.20 cm RV Mid diam:    3.10 cm RV S prime:     11.90 cm/s TAPSE (M-mode): 2.1 cm LEFT ATRIUM             Index        RIGHT ATRIUM           Index LA diam:        2.30 cm 1.33 cm/m   RA  Pressure: 3.00 mmHg LA Vol (A2C):   20.2 ml 11.67 ml/m  RA Area:     12.80 cm LA Vol (A4C):   44.7 ml 25.83 ml/m  RA Volume:   32.50 ml  18.78 ml/m LA Biplane Vol: 32.3 ml 18.67 ml/m  AORTIC VALVE LVOT Vmax:   94.70 cm/s LVOT Vmean:  60.400 cm/s LVOT VTI:    0.142 m  AORTA Ao Root diam: 3.10 cm Ao Asc diam:  3.10 cm MITRAL VALVE               TRICUSPID VALVE MV Area (PHT)  cm         Estimated RAP:  3.00 mmHg MV Decel Time: 134 msec MV E velocity: 84.90 cm/s  SHUNTS MV A velocity: 94.90 cm/s  Systemic VTI:  0.14 m MV E/A ratio:  0.89        Systemic Diam: 2.00 cm Donato Schultz MD Electronically signed by Donato Schultz MD Signature Date/Time: 07/23/2023/4:51:57 PM    Final     Administration History     None          Latest Ref Rng & Units 06/30/2016   11:04 AM  PFT Results  FVC-Pre L 2.03   FVC-Predicted Pre % 56    FVC-Post L 2.57   FVC-Predicted Post % 71   Pre FEV1/FVC % % 73   Post FEV1/FCV % % 73   FEV1-Pre L 1.48   FEV1-Predicted Pre % 52   FEV1-Post L 1.88   DLCO uncorrected ml/min/mmHg 17.68   DLCO UNC% % 69   DLVA Predicted % 103   TLC L 5.14   TLC % Predicted % 98   RV % Predicted % 152     No results found for: "NITRICOXIDE"      Assessment & Plan:   Assessment and Plan    Chronic Obstructive Pulmonary Disease (COPD) with asthma  -exacerbation with recent suspected viral illness. CT chest neg for PE,.  She has significant COPD with an asthma component, presenting with a persistent cough but no recent wheezing. Management includes the [facility] inhaler. Oxygen desaturation occurs with activity, requiring supplemental oxygen.  Continue Breztri inhaler twice daily and use the albuterol inhaler as needed. Finish Augmentin as planned. Mucinex DM as needed.   Chronic respiratory Failure -continue on oxygen 2 L to maintain O2 saturation greater than 90%  Lung cancer  -suspected recurrent lung cancer with enlarging Left sided opacities.  She is scheduled for bronchoscopy and potential ablation with Dr. Tonia Brooms at [facility].   Viral illness   A recent viral illness contracted during a trip to [place] on July 31, 2023, presented with fever . Tests were negative for flu and COVID-19. A CT chest ruled out pulmonary embolism. Augmentin was prescribed for a secondary bacterial infection. She should finish the 10-day course of Augmentin, use the albuterol inhaler as needed for dyspnea, and administer oxygen at 2 l/m to keep o2 sats >90%.  aConsider Mucinex DM or Robitussin for cough if needed.  Heart failure  -recent echo shows improvement She has a history of heart failure with previous hospitalization in November 2024. An echocardiogram showed improved cardiac function. Previously on Farxiga, Lasix, and Coreg, but these medications were discontinued.   Follow-up   Follow-up with Dr.  Delton Coombes in 6 to 8 weeks.        Rubye Oaks, NP 08/10/2023

## 2023-08-10 NOTE — Patient Instructions (Signed)
 Finish Augmentin as directed.  Mucinex DM Twice daily  As needed  cough/congestion.  Continue on Breztri 2 puffs Twice daily , rinse after use.  Follow up with Dr. Tonia Brooms at [facility] as planned  Albuterol inhaler As needed   Activity as tolerated.  Wear Oxygen 2l/m with activity to keep O2 sats >90% Follow up with Dr. Delton Coombes  in 6-8 weeks and As needed

## 2023-08-16 DIAGNOSIS — I5082 Biventricular heart failure: Secondary | ICD-10-CM | POA: Diagnosis not present

## 2023-08-30 ENCOUNTER — Telehealth: Payer: Self-pay | Admitting: *Deleted

## 2023-08-30 NOTE — Telephone Encounter (Signed)
 Gina Cervantes called to report she has canceled her May CT scan and OV with Dr. Scherrie Curt. She has appointment on 09/11/23 with Dr. Thelda Finney (has moved to practice in [place]) to prepare for bronchoscopy and then possible procedure where tumor is hit w/electrical pulses to kill the cancer. Dr. Thelda Finney has said he will keep Dr. Scherrie Curt posted on what is going on and she will as well. She will call when she wants to reschedule. She reports she is feeling "great". MD notified

## 2023-09-10 ENCOUNTER — Ambulatory Visit (HOSPITAL_BASED_OUTPATIENT_CLINIC_OR_DEPARTMENT_OTHER): Payer: BC Managed Care – PPO

## 2023-09-10 DIAGNOSIS — R918 Other nonspecific abnormal finding of lung field: Secondary | ICD-10-CM | POA: Diagnosis not present

## 2023-09-10 DIAGNOSIS — Z853 Personal history of malignant neoplasm of breast: Secondary | ICD-10-CM | POA: Diagnosis not present

## 2023-09-10 DIAGNOSIS — Z85048 Personal history of other malignant neoplasm of rectum, rectosigmoid junction, and anus: Secondary | ICD-10-CM | POA: Diagnosis not present

## 2023-09-10 DIAGNOSIS — Z85118 Personal history of other malignant neoplasm of bronchus and lung: Secondary | ICD-10-CM | POA: Diagnosis not present

## 2023-09-11 ENCOUNTER — Encounter: Payer: Self-pay | Admitting: Oncology

## 2023-09-13 ENCOUNTER — Other Ambulatory Visit: Payer: BC Managed Care – PPO | Admitting: Oncology

## 2023-09-15 DIAGNOSIS — I5082 Biventricular heart failure: Secondary | ICD-10-CM | POA: Diagnosis not present

## 2023-09-18 ENCOUNTER — Telehealth: Payer: Self-pay

## 2023-09-18 NOTE — Telephone Encounter (Signed)
 I called patient to see if she was available for surgery w/ Dr. Annabell Key on 10/22/23 at 3 pm @[facility] . I left a detailed message and asked patient to call me to confirm her availability.[phone].

## 2023-09-19 ENCOUNTER — Ambulatory Visit (HOSPITAL_BASED_OUTPATIENT_CLINIC_OR_DEPARTMENT_OTHER)
Admission: RE | Admit: 2023-09-19 | Discharge: 2023-09-19 | Disposition: A | Discharge status: Home / Self Care | Source: Ambulatory Visit | Attending: Oncology | Admitting: Oncology

## 2023-09-19 DIAGNOSIS — J439 Emphysema, unspecified: Secondary | ICD-10-CM | POA: Diagnosis not present

## 2023-09-19 DIAGNOSIS — C3412 Malignant neoplasm of upper lobe, left bronchus or lung: Secondary | ICD-10-CM | POA: Diagnosis not present

## 2023-09-19 DIAGNOSIS — J90 Pleural effusion, not elsewhere classified: Secondary | ICD-10-CM | POA: Diagnosis not present

## 2023-09-19 DIAGNOSIS — J929 Pleural plaque without asbestos: Secondary | ICD-10-CM | POA: Diagnosis not present

## 2023-09-27 ENCOUNTER — Encounter: Payer: Self-pay | Admitting: Emergency Medicine

## 2023-09-27 ENCOUNTER — Ambulatory Visit (INDEPENDENT_AMBULATORY_CARE_PROVIDER_SITE_OTHER): Admitting: Emergency Medicine

## 2023-09-27 ENCOUNTER — Telehealth: Payer: Self-pay

## 2023-09-27 VITALS — BP 100/70 | HR 98 | Ht 65.0 in | Wt 146.4 lb

## 2023-09-27 DIAGNOSIS — J4489 Other specified chronic obstructive pulmonary disease: Secondary | ICD-10-CM

## 2023-09-27 DIAGNOSIS — R918 Other nonspecific abnormal finding of lung field: Secondary | ICD-10-CM

## 2023-09-27 DIAGNOSIS — Z87891 Personal history of nicotine dependence: Secondary | ICD-10-CM | POA: Diagnosis not present

## 2023-09-27 MED ORDER — BREZTRI AEROSPHERE 160-9-4.8 MCG/ACT IN AERO: 2.0000 | INHALATION_SPRAY | Freq: Two times a day (BID) | RESPIRATORY_TRACT | 6 refills | Status: AC

## 2023-09-27 MED ORDER — ALBUTEROL SULFATE HFA 108 (90 BASE) MCG/ACT IN AERS: INHALATION_SPRAY | RESPIRATORY_TRACT | 1 refills | Status: DC

## 2023-09-27 NOTE — Progress Notes (Signed)
 Subjective:    Patient ID: Gina Cervantes, female    DOB: August 20, 1960, 63 y.o.   MRN: [id number]  HPI Pleasant 63 year old woman who has been followed by Dr. Thelda Finney in our office.  She has a history of anal cancer 2014, breast cancer 2009, left upper lobe adenosquamous lung cancer in 2020 (diagnosed on wedge resection), adenocarcinoma of the right upper lobe diagnosed by CT biopsy and treated with SBRT.  She has been treated with systemic therapy by Dr. Scherrie Curt.  She has some radiation induced change, radiation induced bronchiectasis on CT chest as well as several changing subsolid lesions in the left chest concerning for possible adenocarcinoma.  She had a visit with Dr. Thelda Finney in mid December and is considering bronchoscopy for biopsies and possible pulsed electrical field ablation therapy.  She was admitted with RSV and hypoxic resp failure beginning of this month.   CT chest 05/17/2023 reviewed by me showed no evidence of PE, chronic pleural thickening, stable mediastinal nodes, stable right upper and right lower lobe parabronchial streaky opacities.  Unchanged bilateral nodules, specifically groundglass nodules in the left upper and lower lobes.  ROV 09/27/2023 --follow-up visit for 63 year old woman with obstructive lung disease COPD/asthma and a history of left upper lobe adenosquamous lung cancer 2020 (wedge resection), adenocarcinoma right upper lobe (SBRT), anal cancer 2014, breast cancer 2009 followed by Dr. Scherrie Curt.  She has bilateral groundglass pulmonary nodules, was being considered for possible pulsed electrical field ablation treatment with Dr. Thelda Finney in [place], but deferred  - not approved by her insurance. She is feeling well, hoping to get back to work soon.   CT scan of the chest 09/19/2023 reviewed by me showed overall stability compared with previous scans done 08/06/2023 and 05/17/2023.  The areas of scarlike consolidation are unchanged, groundglass pulmonary nodules are stable in  size and appearance but persist.  There is an area of irregular left lower lobe groundglass that appears to have resolved compared with earlier scans.   Review of Systems As per HPI  Past Medical History:  Diagnosis Date   Anal cancer (HCC) 08/09/2012   Squamous cell   Anxiety    PANIC ATTACKS   Arthritis    Breast cancer (HCC) 2009   ER+PR+HER-2+   Ductal carcinoma (HCC) 02/2008   invasive    Heart palpitations    History of radiation therapy 09/02/12-10/10/12   anal 50.4Gy   History of shingles 10/2014   HPV (human papilloma virus) anogenital infection    vulvar/ freezing hx   Hx of radiation therapy 2020   Lung nodule    Malignant neoplasm of upper lobe of left lung (HCC) 06/2016   Personal history of chemotherapy    Personal history of radiation therapy 2010   Pneumonia    NO RECENT PROBLEMS   PONV (postoperative nausea and vomiting)    Hypotension   Radiation 10/21/08-12/08/08   Right breast 6240 cGy   Shingles 2017   Status post chemotherapy 02/2008   Taxol/Herceptin     Family History  Problem Relation Age of Onset   Lung cancer Father    Stroke Mother    Breast cancer Neg Hx      Social History   Socioeconomic History   Marital status: Married    Spouse name: Not on file   Number of children: Not on file   Years of education: Not on file   Highest education level: Not on file  Occupational History    Employer: [employer], [place], [place]    Comment: Realtor  Tobacco Use   Smoking status: Former    Current packs/day: 0.00    Types: Cigarettes    Start date: 2007    Quit date: 2015    Years since quitting: 10.3   Smokeless tobacco: Never   Tobacco comments:    stopped smoking cigarettes Jan. 2018  Vaping Use   Vaping status: Never Used  Substance and Sexual Activity   Alcohol use: Not Currently    Alcohol/week: 14.0 standard drinks of alcohol    Types: 14 Standard drinks or equivalent per week   Drug use: No   Sexual activity: Yes     Partners: Male    Birth control/protection: Other-see comments, Post-menopausal    Comment: vasectomy  Other Topics Concern   Not on file  Social History Narrative   Married   Lost a son age 16 this year and husband's brother, still going through grieving process.   No family history of breast or ovarian cancer.   Employed as Veterinary surgeon   Social Drivers of Corporate investment banker Strain: Not on file  Food Insecurity: No Food Insecurity (05/17/2023)   Hunger Vital Sign    Worried About Running Out of Food in the Last Year: Never true    Ran Out of Food in the Last Year: Never true  Transportation Needs: No Transportation Needs (05/17/2023)   PRAPARE - Administrator, Civil Service (Medical): No    Lack of Transportation (Non-Medical): No  Physical Activity: Not on file  Stress: Not on file  Social Connections: Not on file  Intimate Partner Violence: Not At Risk (05/17/2023)   Humiliation, Afraid, Rape, and Kick questionnaire    Fear of Current or Ex-Partner: No    Emotionally Abused: No    Physically Abused: No    Sexually Abused: No     No Known Allergies   Outpatient Medications Prior to Visit  Medication Sig Dispense Refill   loratadine  (CLARITIN ) 10 MG tablet Take 10 mg by mouth daily.     albuterol  (VENTOLIN  HFA) 108 (90 Base) MCG/ACT inhaler INHALE 2 PUFFS BY MOUTH EVERY 6 HOURS AS NEEDED FOR WHEEZING OR SHORTNESS OF BREATH 8.5 g 1   BREZTRI  AEROSPHERE 160-9-4.8 MCG/ACT AERO INHALE TWO PUFFS BY MOUTH TWICE A DAY IN THE MORNING AND IN THE EVENING 10.7 g 6   amoxicillin -clavulanate (AUGMENTIN ) 875-125 MG tablet Take 1 tablet by mouth 2 (two) times daily. 20 tablet 0   LORazepam  (ATIVAN ) 0.5 MG tablet Take 1 tablet (0.5 mg total) by mouth every 8 (eight) hours as needed for anxiety (or nausea). (Patient not taking: Reported on 09/27/2023) 60 tablet 0   No facility-administered medications prior to visit.        Objective:   Physical Exam Vitals:   09/27/23  1023  BP: 100/70  Pulse: 98  SpO2: 91%  Weight: 146 lb 6.4 oz (66.4 kg)  Height: 5\' 5"  (1.651 m)    Gen: Pleasant, well-nourished, in no distress,  normal affect  ENT: No lesions,  mouth clear,  oropharynx clear, no postnasal drip  Neck: No JVD, no stridor  Lungs: No use of accessory muscles, no crackles or wheezing on normal respiration, no wheeze on forced expiration  Cardiovascular: RRR, heart sounds normal, no murmur or gallops, no peripheral edema  Musculoskeletal: No deformities, no cyanosis or clubbing  Neuro: alert, awake, non focal  Skin: Warm, no lesions or rash  Assessment & Plan:  COPD with asthma (HCC) Continue Breztri  2 puffs twice a day.  Rinse gargle after using. Keep albuterol  available use 2 puffs when needed for shortness of breath, chest tightness, wheezing.  Pulmonary nodules Certainly concerning for adenocarcinoma given her history, prior treatment.  That said there has not been an interval change for several months going back to November 2024.  Discussed with her possible next steps including bronchoscopy for tissue diagnosis and culture data, watchful waiting with a repeat CT in 6 months.  She is going to discuss with her husband, Dr. Scherrie Curt and think about options.  If we do establish adenocarcinoma she indicates that she is very hesitant to reconsider SBRT or chemotherapy again.  She has discussed possible PEF with Dr. Thelda Finney in [place].  That may be an option for her to be done either in [place] or in [employer] depending on how the evaluation unfolds.  I have ordered a repeat CT for 6 months.  She will let me know if she wants to pursue navigational bronchoscopy before that.  We reviewed your CT scans of the chest today.  There are persistent stable areas of scarring as well as scattered hazy pulmonary nodules.  We considered options for evaluation including following with serial CT imaging for stability.  We also discussed possible  navigational bronchoscopy to obtain more definitive information about the hazy nodules. For now we will schedule a repeat CT scan of the chest to be done in November. Follow with Dr. Baldwin Levee to review that scan in November 2025.  Please call sooner if you have any problems or if you decide you would like to revisit bronchoscopy prior to our follow-up.   Time spent 43 minutes  Racheal Buddle, MD, PhD 09/27/2023, 12:00 PM [facility] [phone] or if no answer before 7:00PM call [phone] For any issues after 7:00PM please call eLink [phone]

## 2023-09-27 NOTE — Assessment & Plan Note (Signed)
 Certainly concerning for adenocarcinoma given her history, prior treatment.  That said there has not been an interval change for several months going back to November 2024.  Discussed with her possible next steps including bronchoscopy for tissue diagnosis and culture data, watchful waiting with a repeat CT in 6 months.  She is going to discuss with her husband, Dr. Scherrie Curt and think about options.  If we do establish adenocarcinoma she indicates that she is very hesitant to reconsider SBRT or chemotherapy again.  She has discussed possible PEF with Dr. Thelda Finney in [place].  That may be an option for her to be done either in [place] or in [place] depending on how the evaluation unfolds.  I have ordered a repeat CT for 6 months.  She will let me know if she wants to pursue navigational bronchoscopy before that.  We reviewed your CT scans of the chest today.  There are persistent stable areas of scarring as well as scattered hazy pulmonary nodules.  We considered options for evaluation including following with serial CT imaging for stability.  We also discussed possible navigational bronchoscopy to obtain more definitive information about the hazy nodules. For now we will schedule a repeat CT scan of the chest to be done in November. Follow with Dr. Baldwin Levee to review that scan in November 2025.  Please call sooner if you have any problems or if you decide you would like to revisit bronchoscopy prior to our follow-up.

## 2023-09-27 NOTE — Telephone Encounter (Signed)
 Patient called to schedule her procedure with Dr. Annabell Key on 11/12/23 at [name] @2 :30 pm. I provided pre-op instructions and surgery details by phone. Written details are being sent to patients Mychart.

## 2023-09-27 NOTE — Patient Instructions (Addendum)
 Continue Breztri  2 puffs twice a day.  Rinse gargle after using. Keep albuterol  available use 2 puffs when needed for shortness of breath, chest tightness, wheezing. We reviewed your CT scans of the chest today.  There are persistent stable areas of scarring as well as scattered hazy pulmonary nodules.  We considered options for evaluation including following with serial CT imaging for stability.  We also discussed possible navigational bronchoscopy to obtain more definitive information about the hazy nodules. For now we will schedule a repeat CT scan of the chest to be done in November. Follow with Dr. Baldwin Levee to review that scan in November 2025.  Please call sooner if you have any problems or if you decide you would like to revisit bronchoscopy prior to our follow-up.

## 2023-09-27 NOTE — Assessment & Plan Note (Signed)
 Continue Breztri  2 puffs twice a day.  Rinse gargle after using. Keep albuterol  available use 2 puffs when needed for shortness of breath, chest tightness, wheezing.

## 2023-09-28 ENCOUNTER — Encounter (HOSPITAL_BASED_OUTPATIENT_CLINIC_OR_DEPARTMENT_OTHER): Payer: Self-pay

## 2023-10-05 ENCOUNTER — Encounter: Payer: Self-pay | Admitting: *Deleted

## 2023-10-05 ENCOUNTER — Ambulatory Visit: Payer: Self-pay | Admitting: Oncology

## 2023-10-05 NOTE — Progress Notes (Signed)
 Routed recent CT report to Dr. Baldwin Levee and faxed to Dr. Antonio Baumgarten in [place] at patient request.

## 2023-10-07 ENCOUNTER — Other Ambulatory Visit (HOSPITAL_BASED_OUTPATIENT_CLINIC_OR_DEPARTMENT_OTHER): Payer: Self-pay | Admitting: Obstetrics & Gynecology

## 2023-10-07 DIAGNOSIS — Z01818 Encounter for other preprocedural examination: Secondary | ICD-10-CM

## 2023-10-15 DIAGNOSIS — R918 Other nonspecific abnormal finding of lung field: Secondary | ICD-10-CM | POA: Diagnosis not present

## 2023-10-16 DIAGNOSIS — I5082 Biventricular heart failure: Secondary | ICD-10-CM | POA: Diagnosis not present

## 2023-10-18 DIAGNOSIS — L821 Other seborrheic keratosis: Secondary | ICD-10-CM | POA: Diagnosis not present

## 2023-10-18 DIAGNOSIS — J8410 Pulmonary fibrosis, unspecified: Secondary | ICD-10-CM | POA: Diagnosis not present

## 2023-11-07 ENCOUNTER — Other Ambulatory Visit: Payer: Self-pay

## 2023-11-07 ENCOUNTER — Encounter (HOSPITAL_COMMUNITY): Payer: Self-pay | Admitting: Obstetrics & Gynecology

## 2023-11-07 NOTE — Progress Notes (Signed)
 SDW CALL  Patient was given pre-op instructions over the phone. The opportunity was given for the patient to ask questions. No further questions asked. Patient verbalized understanding of instructions given.   PCP - Elsie Lesches Cardiologist - Dr. Gordy Bergamo Pulmonologist - Dr. Shelah and Dr. Brenna Oncologist - Dr. Arley Hof  PPM/ICD - denies  Chest CT 09/19/23 EKG - 07/23/23 Stress Test -  ECHO - 07/23/23 Cardiac Cath - 04/06/23  Sleep Study - denies    No DM  Last dose of GLP1 agonist-  n/a GLP1 instructions:  n/a  Blood Thinner Instructions: n/a Aspirin  Instructions: n/a  ERAS Protcol - clears until 1130 PRE-SURGERY Ensure or G2- n/a  COVID TEST- no   Anesthesia review: yes   Patient denies shortness of breath, fever, cough and chest pain over the phone call   All instructions explained to the patient, with a verbal understanding of the material. Patient agrees to go over the instructions while at home for a better understanding.    Patient states she does have oxygen  at home but has not needed to use it.

## 2023-11-07 NOTE — Progress Notes (Signed)
 Anesthesia Chart Review: Same day workup  63 year old female with pertinent history including chronic HFmrEF/NICM, obstructive lung disease COPD/asthma and a history of left upper lobe adenosquamous lung cancer 2020 (wedge resection), adenocarcinoma right upper lobe (SBRT), anal cancer 2014, breast cancer 2009, s/p multiple rounds of chemotherapy and radiation followed by oncologist Dr. Cloretta.   She is followed by pulmonologist Dr. Shelah for pulmonary nodules.  Last seen 09/28/2023.  Per note, she has persistent stable areas of scarring as well as scattered hazy pulmonary nodules.  Plan at this time is to continue to monitor with repeat CT scan in 6 months.  Follows with cardiology for history of nonischemic cardiomyopathy and HFmrEF.  Most recent echo 07/23/2023 showed normalization of LVEF 60 to 65%, grade 1 DD, normal RV systolic function, no significant valvular abnormalities.  Prior cath 04/06/2023 showed mild CAD, mild pulmonary hypertension.  She will need day of surgery labs and evaluation.  EKG 07/23/2023: Sinus tachycardia.  Rate 117. Right atrial enlargement. Left posterior fascicular block  CT chest 09/19/2023: IMPRESSION: 1. The right upper lobe suprahilar mass is only slightly more prominent than previously, today measuring 4.3 x 2.9 cm and was previously 4.1 x 2.7 cm. 2. The right mid perihilar mass with bronchovascular involvement is not notably changed, measuring 4.1 x 4.2 cm. 3. Stable mildly prominent right hilar and mediastinal lymph nodes. 4. Stable 5 mm right lower lobe nodule, bilateral scarring change, left upper lobe surgical change. 5. Stable subsolid ground-glass nodules in the left upper lobe and left lower lobe. 6. Emphysema. 7. Aortic and coronary artery atherosclerosis. 8. Enlarged pulmonary trunk indicating arterial hypertension. 9. Chronic minimal loculated pleural fluid in the posterior/posterolateral right chest base, with mild smooth pleural thickening  in the lower right thorax.  TTE 07/23/23: 1. Left ventricular ejection fraction, by estimation, is 60 to 65%. Left  ventricular ejection fraction by 3D volume is 59 %. The left ventricle has  normal function. The left ventricle has no regional wall motion  abnormalities. Left ventricular diastolic   parameters are consistent with Grade I diastolic dysfunction (impaired  relaxation).   2. Right ventricular systolic function is normal. The right ventricular  size is mildly enlarged.   3. Right atrial size was mildly dilated.   4. The mitral valve is normal in structure. No evidence of mitral valve  regurgitation. No evidence of mitral stenosis.   5. The aortic valve is normal in structure. Aortic valve regurgitation is  not visualized. No aortic stenosis is present.   6. The inferior vena cava is normal in size with greater than 50%  respiratory variability, suggesting right atrial pressure of 3 mmHg.   Cath 04/06/2023: Impression and recommendations: Mild coronary calcification involving the proximal LAD.  Otherwise normal coronary arteries. Mild pulmonary hypertension with low LVEDP and pulmonary capillary wedge suggests primary pulmonary hypertension (WHO group 3).  PAPi is preserved suggesting relatively preserved RV systolic function reserve.   Will start the patient on Farxiga  5 mg daily for heart failure management.  Continue Entresto  and Coreg  at low-dose in view of soft blood pressure.  She will need referral to pulmonary medicine to evaluate for pulmonary fibrosis and pulmonary hypertension and consideration for starting her on Tyvaso for pulmonary hypertension as a disease modifying agent for WHO group 3 PAH. Start low-dose statins in view of coronary calcification.    Gina Cervantes [facility] Short Stay Center/Anesthesiology Phone [phone] 11/07/2023 10:38 AM

## 2023-11-07 NOTE — Anesthesia Preprocedure Evaluation (Addendum)
 Anesthesia Evaluation  Patient identified by MRN, date of birth, ID band Patient awake    Reviewed: Allergy & Precautions, NPO status , Patient's Chart, lab work & pertinent test results  History of Anesthesia Complications (+) PONV and history of anesthetic complications  Airway Mallampati: II  TM Distance: >3 FB Neck ROM: Full   Comment: Previous grade I view with Miller 3, easy mask Dental  (+) Dental Advisory Given, Missing   Pulmonary neg shortness of breath, asthma , neg sleep apnea, COPD (uses oxygen  2L prn, rare use),  COPD inhaler, neg recent URI, former smoker LUL cancer s/p lobectomy   Pulmonary exam normal breath sounds clear to auscultation       Cardiovascular pulmonary hypertension (mild)(-) angina +CHF  (-) Past MI, (-) Cardiac Stents and (-) CABG + dysrhythmias (palpitations)  Rhythm:Regular Rate:Normal  TTE 07/23/2023: IMPRESSIONS    1. Left ventricular ejection fraction, by estimation, is 60 to 65%. Left  ventricular ejection fraction by 3D volume is 59 %. The left ventricle has  normal function. The left ventricle has no regional wall motion  abnormalities. Left ventricular diastolic   parameters are consistent with Grade I diastolic dysfunction (impaired  relaxation).   2. Right ventricular systolic function is normal. The right ventricular  size is mildly enlarged.   3. Right atrial size was mildly dilated.   4. The mitral valve is normal in structure. No evidence of mitral valve  regurgitation. No evidence of mitral stenosis.   5. The aortic valve is normal in structure. Aortic valve regurgitation is  not visualized. No aortic stenosis is present.   6. The inferior vena cava is normal in size with greater than 50%  respiratory variability, suggesting right atrial pressure of 3 mmHg.   R/LHC 04/06/2023: Impression and recommendations: Mild coronary calcification involving the proximal LAD.  Otherwise  normal coronary arteries. Mild pulmonary hypertension with low LVEDP and pulmonary capillary wedge suggests primary pulmonary hypertension (WHO group 3).  PAPi is preserved suggesting relatively preserved RV systolic function reserve.     Neuro/Psych neg Seizures PSYCHIATRIC DISORDERS (panic attacks) Anxiety     negative neurological ROS     GI/Hepatic Neg liver ROS,neg GERD  ,,H/o anal cancer   Endo/Other  negative endocrine ROS    Renal/GU negative Renal ROS     Musculoskeletal  (+) Arthritis ,    Abdominal   Peds  Hematology negative hematology ROS (+) Lab Results      Component                Value               Date                      WBC                      5.9                 07/23/2023                HGB                      14.7                07/23/2023                HCT  43.5                07/23/2023                MCV                      107 (H)             07/23/2023                PLT                      284                 07/23/2023              Anesthesia Other Findings   Reproductive/Obstetrics H/o breast cancer                              Anesthesia Physical Anesthesia Plan  ASA: 3  Anesthesia Plan: General   Post-op Pain Management: Tylenol  PO (pre-op)*   Induction: Intravenous  PONV Risk Score and Plan: 4 or greater and Ondansetron , Dexamethasone , Midazolam  and Treatment may vary due to age or medical condition  Airway Management Planned: LMA  Additional Equipment:   Intra-op Plan:   Post-operative Plan:   Informed Consent: I have reviewed the patients History and Physical, chart, labs and discussed the procedure including the risks, benefits and alternatives for the proposed anesthesia with the patient or authorized representative who has indicated his/her understanding and acceptance.     Dental advisory given  Plan Discussed with: Anesthesiologist and CRNA  Anesthesia Plan  Comments: (Risks of general anesthesia discussed including, but not limited to, sore throat, hoarse voice, chipped/damaged teeth, injury to vocal cords, nausea and vomiting, allergic reactions, lung infection, heart attack, stroke, and death. All questions answered.   PAT note by Lynwood Hope, PA-C: 63 year old female with pertinent history including chronic HFmrEF/NICM, obstructive lung disease COPD/asthma and a history of left upper lobe adenosquamous lung cancer 2020 (wedge resection), adenocarcinoma right upper lobe (SBRT), anal cancer 2014, breast cancer 2009, s/p multiple rounds of chemotherapy and radiation followed by oncologist Dr. Cloretta.   She is followed by pulmonologist Dr. Shelah for pulmonary nodules.  Last seen 09/28/2023.  Per note, she has persistent stable areas of scarring as well as scattered hazy pulmonary nodules.  Plan at this time is to continue to monitor with repeat CT scan in 6 months.  Follows with cardiology for history of nonischemic cardiomyopathy and HFmrEF.  Most recent echo 07/23/2023 showed normalization of LVEF 60 to 65%, grade 1 DD, normal RV systolic function, no significant valvular abnormalities.  Prior cath 04/06/2023 showed mild CAD, mild pulmonary hypertension.  She will need day of surgery labs and evaluation.  EKG 07/23/2023: Sinus tachycardia.  Rate 117. Right atrial enlargement. Left posterior fascicular block  CT chest 09/19/2023: IMPRESSION: 1. The right upper lobe suprahilar mass is only slightly more prominent than previously, today measuring 4.3 x 2.9 cm and was previously 4.1 x 2.7 cm. 2. The right mid perihilar mass with bronchovascular involvement is not notably changed, measuring 4.1 x 4.2 cm. 3. Stable mildly prominent right hilar and mediastinal lymph nodes. 4. Stable 5 mm right lower lobe nodule, bilateral scarring change, left upper lobe surgical change. 5. Stable subsolid ground-glass nodules in the left upper lobe and left lower  lobe. 6. Emphysema.  7. Aortic and coronary artery atherosclerosis. 8. Enlarged pulmonary trunk indicating arterial hypertension. 9. Chronic minimal loculated pleural fluid in the posterior/posterolateral right chest base, with mild smooth pleural thickening in the lower right thorax.  TTE 07/23/23: 1. Left ventricular ejection fraction, by estimation, is 60 to 65%. Left  ventricular ejection fraction by 3D volume is 59 %. The left ventricle has  normal function. The left ventricle has no regional wall motion  abnormalities. Left ventricular diastolic  parameters are consistent with Grade I diastolic dysfunction (impaired  relaxation).  2. Right ventricular systolic function is normal. The right ventricular  size is mildly enlarged.  3. Right atrial size was mildly dilated.  4. The mitral valve is normal in structure. No evidence of mitral valve  regurgitation. No evidence of mitral stenosis.  5. The aortic valve is normal in structure. Aortic valve regurgitation is  not visualized. No aortic stenosis is present.  6. The inferior vena cava is normal in size with greater than 50%  respiratory variability, suggesting right atrial pressure of 3 mmHg.   Cath 04/06/2023: Impression and recommendations: Mild coronary calcification involving the proximal LAD.  Otherwise normal coronary arteries. Mild pulmonary hypertension with low LVEDP and pulmonary capillary wedge suggests primary pulmonary hypertension (WHO group 3).  PAPi is preserved suggesting relatively preserved RV systolic function reserve.  Will start the patient on Farxiga  5 mg daily for heart failure management.  Continue Entresto  and Coreg  at low-dose in view of soft blood pressure.  She will need referral to pulmonary medicine to evaluate for pulmonary fibrosis and pulmonary hypertension and consideration for starting her on Tyvaso for pulmonary hypertension as a disease modifying agent for WHO group 3 PAH. Start low-dose  statins in view of coronary calcification.   )         Anesthesia Quick Evaluation

## 2023-11-08 ENCOUNTER — Other Ambulatory Visit (HOSPITAL_BASED_OUTPATIENT_CLINIC_OR_DEPARTMENT_OTHER): Payer: Self-pay | Admitting: Obstetrics & Gynecology

## 2023-11-12 ENCOUNTER — Other Ambulatory Visit: Payer: Self-pay

## 2023-11-12 ENCOUNTER — Encounter (HOSPITAL_COMMUNITY): Payer: Self-pay | Admitting: Obstetrics & Gynecology

## 2023-11-12 ENCOUNTER — Ambulatory Visit (HOSPITAL_COMMUNITY): Payer: Self-pay | Admitting: Physician Assistant

## 2023-11-12 ENCOUNTER — Encounter (HOSPITAL_COMMUNITY)
Admission: RE | Disposition: A | Discharge status: Home / Self Care | Payer: Self-pay | Source: Home / Self Care | Attending: Obstetrics & Gynecology

## 2023-11-12 ENCOUNTER — Ambulatory Visit (HOSPITAL_COMMUNITY)
Admission: RE | Admit: 2023-11-12 | Discharge: 2023-11-12 | Disposition: A | Discharge status: Home / Self Care | Attending: Obstetrics & Gynecology | Admitting: Obstetrics & Gynecology

## 2023-11-12 DIAGNOSIS — D060 Carcinoma in situ of endocervix: Secondary | ICD-10-CM | POA: Diagnosis not present

## 2023-11-12 DIAGNOSIS — D069 Carcinoma in situ of cervix, unspecified: Secondary | ICD-10-CM | POA: Diagnosis not present

## 2023-11-12 DIAGNOSIS — Z9889 Other specified postprocedural states: Secondary | ICD-10-CM | POA: Insufficient documentation

## 2023-11-12 DIAGNOSIS — I428 Other cardiomyopathies: Secondary | ICD-10-CM | POA: Insufficient documentation

## 2023-11-12 DIAGNOSIS — Z9221 Personal history of antineoplastic chemotherapy: Secondary | ICD-10-CM | POA: Diagnosis not present

## 2023-11-12 DIAGNOSIS — I445 Left posterior fascicular block: Secondary | ICD-10-CM | POA: Diagnosis not present

## 2023-11-12 DIAGNOSIS — Z7951 Long term (current) use of inhaled steroids: Secondary | ICD-10-CM | POA: Insufficient documentation

## 2023-11-12 DIAGNOSIS — J449 Chronic obstructive pulmonary disease, unspecified: Secondary | ICD-10-CM | POA: Diagnosis not present

## 2023-11-12 DIAGNOSIS — Z9981 Dependence on supplemental oxygen: Secondary | ICD-10-CM | POA: Insufficient documentation

## 2023-11-12 DIAGNOSIS — Z87891 Personal history of nicotine dependence: Secondary | ICD-10-CM | POA: Diagnosis not present

## 2023-11-12 DIAGNOSIS — Z01818 Encounter for other preprocedural examination: Secondary | ICD-10-CM

## 2023-11-12 DIAGNOSIS — I272 Pulmonary hypertension, unspecified: Secondary | ICD-10-CM | POA: Insufficient documentation

## 2023-11-12 DIAGNOSIS — Z79899 Other long term (current) drug therapy: Secondary | ICD-10-CM | POA: Diagnosis not present

## 2023-11-12 DIAGNOSIS — Z85048 Personal history of other malignant neoplasm of rectum, rectosigmoid junction, and anus: Secondary | ICD-10-CM | POA: Diagnosis not present

## 2023-11-12 DIAGNOSIS — I2510 Atherosclerotic heart disease of native coronary artery without angina pectoris: Secondary | ICD-10-CM | POA: Diagnosis not present

## 2023-11-12 DIAGNOSIS — I5043 Acute on chronic combined systolic (congestive) and diastolic (congestive) heart failure: Secondary | ICD-10-CM | POA: Diagnosis not present

## 2023-11-12 DIAGNOSIS — J439 Emphysema, unspecified: Secondary | ICD-10-CM | POA: Diagnosis not present

## 2023-11-12 DIAGNOSIS — F419 Anxiety disorder, unspecified: Secondary | ICD-10-CM | POA: Diagnosis not present

## 2023-11-12 DIAGNOSIS — N871 Moderate cervical dysplasia: Secondary | ICD-10-CM | POA: Diagnosis not present

## 2023-11-12 DIAGNOSIS — I5022 Chronic systolic (congestive) heart failure: Secondary | ICD-10-CM | POA: Diagnosis not present

## 2023-11-12 DIAGNOSIS — Z923 Personal history of irradiation: Secondary | ICD-10-CM | POA: Insufficient documentation

## 2023-11-12 DIAGNOSIS — I110 Hypertensive heart disease with heart failure: Secondary | ICD-10-CM | POA: Insufficient documentation

## 2023-11-12 DIAGNOSIS — Z853 Personal history of malignant neoplasm of breast: Secondary | ICD-10-CM | POA: Diagnosis not present

## 2023-11-12 HISTORY — DX: Dyspnea, unspecified: R06.00

## 2023-11-12 HISTORY — DX: Chronic obstructive pulmonary disease, unspecified: J44.9

## 2023-11-12 HISTORY — PX: CERVICAL CONIZATION W/BX: SHX1330

## 2023-11-12 HISTORY — DX: Heart failure, unspecified: I50.9

## 2023-11-12 LAB — BASIC METABOLIC PANEL WITH GFR
Anion gap: 13 (ref 5–15)
BUN: 14 mg/dL (ref 8–23)
CO2: 29 mmol/L (ref 22–32)
Calcium: 9.2 mg/dL (ref 8.9–10.3)
Chloride: 96 mmol/L — ABNORMAL LOW (ref 98–111)
Creatinine, Ser: 0.87 mg/dL (ref 0.44–1.00)
GFR, Estimated: >60 mL/min (ref >60)
Glucose, Bld: 101 mg/dL — ABNORMAL HIGH (ref 70–99)
Potassium: 4.7 mmol/L (ref 3.5–5.1)
Sodium: 138 mmol/L (ref 135–145)

## 2023-11-12 LAB — TYPE AND SCREEN
ABO/RH(D): B POS
Antibody Screen: NEGATIVE

## 2023-11-12 LAB — CBC
HCT: 46.2 % — ABNORMAL HIGH (ref 36.0–46.0)
Hemoglobin: 15.6 g/dL — ABNORMAL HIGH (ref 12.0–15.0)
MCH: 35.9 pg — ABNORMAL HIGH (ref 26.0–34.0)
MCHC: 33.8 g/dL (ref 30.0–36.0)
MCV: 106.5 fL — ABNORMAL HIGH (ref 80.0–100.0)
Platelets: 254 K/uL (ref 150–400)
RBC: 4.34 MIL/uL (ref 3.87–5.11)
RDW: 13.2 % (ref 11.5–15.5)
WBC: 5.7 K/uL (ref 4.0–10.5)
nRBC: 0 % (ref 0.0–0.2)

## 2023-11-12 SURGERY — CONE BIOPSY, CERVIX
Anesthesia: General

## 2023-11-12 MED ORDER — LACTATED RINGERS IV SOLN: INTRAVENOUS | Status: DC

## 2023-11-12 MED ORDER — ACETAMINOPHEN 500 MG PO TABS
1000.0000 mg | ORAL_TABLET | Freq: Once | ORAL | Status: AC
  Administered 2023-11-12: 1000 mg via ORAL
  Filled 2023-11-12: qty 2

## 2023-11-12 MED ORDER — ORAL CARE MOUTH RINSE: 15.0000 mL | Freq: Once | OROMUCOSAL | Status: AC

## 2023-11-12 MED ORDER — FENTANYL CITRATE (PF) 250 MCG/5ML IJ SOLN
INTRAMUSCULAR | Status: DC | PRN
  Administered 2023-11-12: 100 ug via INTRAVENOUS
  Administered 2023-11-12: 50 ug via INTRAVENOUS

## 2023-11-12 MED ORDER — LIDOCAINE-EPINEPHRINE 1 %-1:100000 IJ SOLN
INTRAMUSCULAR | Status: AC
  Filled 2023-11-12: qty 1

## 2023-11-12 MED ORDER — ONDANSETRON HCL 4 MG/2ML IJ SOLN
INTRAMUSCULAR | Status: DC | PRN
  Administered 2023-11-12: 4 mg via INTRAVENOUS

## 2023-11-12 MED ORDER — HEMOSTATIC AGENTS (NO CHARGE) OPTIME
TOPICAL | Status: DC | PRN
  Administered 2023-11-12: 1 via TOPICAL

## 2023-11-12 MED ORDER — LIDOCAINE 2% (20 MG/ML) 5 ML SYRINGE
INTRAMUSCULAR | Status: DC | PRN
  Administered 2023-11-12: 60 mg via INTRAVENOUS

## 2023-11-12 MED ORDER — POVIDONE-IODINE 10 % EX SWAB
2.0000 | Freq: Once | CUTANEOUS | Status: AC
  Administered 2023-11-12: 2 via TOPICAL

## 2023-11-12 MED ORDER — HYDROCODONE-ACETAMINOPHEN 5-325 MG PO TABS: 1.0000 | ORAL_TABLET | Freq: Four times a day (QID) | ORAL | 0 refills | Status: DC | PRN

## 2023-11-12 MED ORDER — DEXAMETHASONE SODIUM PHOSPHATE 10 MG/ML IJ SOLN
INTRAMUSCULAR | Status: DC | PRN
  Administered 2023-11-12: 10 mg via INTRAVENOUS

## 2023-11-12 MED ORDER — OXYCODONE HCL 5 MG/5ML PO SOLN: 5.0000 mg | Freq: Once | ORAL | Status: DC | PRN

## 2023-11-12 MED ORDER — AMISULPRIDE (ANTIEMETIC) 5 MG/2ML IV SOLN: 10.0000 mg | Freq: Once | INTRAVENOUS | Status: DC | PRN

## 2023-11-12 MED ORDER — CHLORHEXIDINE GLUCONATE 0.12 % MT SOLN
15.0000 mL | Freq: Once | OROMUCOSAL | Status: AC
  Administered 2023-11-12: 15 mL via OROMUCOSAL
  Filled 2023-11-12: qty 15

## 2023-11-12 MED ORDER — MIDAZOLAM HCL 2 MG/2ML IJ SOLN
INTRAMUSCULAR | Status: AC
  Filled 2023-11-12: qty 2

## 2023-11-12 MED ORDER — FENTANYL CITRATE (PF) 100 MCG/2ML IJ SOLN
25.0000 ug | INTRAMUSCULAR | Status: DC | PRN
  Administered 2023-11-12: 50 ug via INTRAVENOUS
  Administered 2023-11-12: 25 ug via INTRAVENOUS

## 2023-11-12 MED ORDER — VASOPRESSIN 20 UNIT/ML IV SOLN
INTRAVENOUS | Status: DC | PRN
  Administered 2023-11-12: 6 mL

## 2023-11-12 MED ORDER — FENTANYL CITRATE (PF) 100 MCG/2ML IJ SOLN
INTRAMUSCULAR | Status: AC
  Filled 2023-11-12: qty 2

## 2023-11-12 MED ORDER — OXYCODONE HCL 5 MG PO TABS: 5.0000 mg | ORAL_TABLET | Freq: Once | ORAL | Status: DC | PRN

## 2023-11-12 MED ORDER — MIDAZOLAM HCL 2 MG/2ML IJ SOLN
INTRAMUSCULAR | Status: DC | PRN
  Administered 2023-11-12: 2 mg via INTRAVENOUS

## 2023-11-12 MED ORDER — PROPOFOL 10 MG/ML IV BOLUS
INTRAVENOUS | Status: AC
  Filled 2023-11-12: qty 20

## 2023-11-12 MED ORDER — MONSELS FERRIC SUBSULFATE EX SOLN
CUTANEOUS | Status: AC
Start: 2023-11-12 — End: 2023-11-12
  Filled 2023-11-12: qty 8

## 2023-11-12 MED ORDER — PROPOFOL 10 MG/ML IV BOLUS
INTRAVENOUS | Status: DC | PRN
  Administered 2023-11-12: 150 mg via INTRAVENOUS

## 2023-11-12 MED ORDER — FENTANYL CITRATE (PF) 250 MCG/5ML IJ SOLN
INTRAMUSCULAR | Status: AC
  Filled 2023-11-12: qty 5

## 2023-11-12 SURGICAL SUPPLY — 23 items
APPLICATOR COTTON TIP 6 STRL (MISCELLANEOUS) IMPLANT
BLADE SURG 11 STRL SS (BLADE) ×2 IMPLANT
CATH ROBINSON RED A/P 14FR (CATHETERS) IMPLANT
COVER MAYO STAND STRL (DRAPES) ×2 IMPLANT
COVER SURGICAL LIGHT HANDLE (MISCELLANEOUS) IMPLANT
DILATOR CANAL MILEX (MISCELLANEOUS) IMPLANT
ELECT BALL LEEP 3MM BLK (ELECTRODE) IMPLANT
ELECT BALL LEEP 5MM RED (ELECTRODE) IMPLANT
ELECTRODE REM PT RTRN 9FT ADLT (ELECTROSURGICAL) IMPLANT
GLOVE BIOGEL PI IND STRL 7.0 (GLOVE) ×4 IMPLANT
GLOVE ECLIPSE 6.5 STRL STRAW (GLOVE) ×4 IMPLANT
GOWN STRL REUS W/ TWL LRG LVL3 (GOWN DISPOSABLE) ×4 IMPLANT
HEMOSTAT SURGICEL 4X8 (HEMOSTASIS) IMPLANT
NS IRRIG 1000ML POUR BTL (IV SOLUTION) ×2 IMPLANT
PACK VAGINAL MINOR WOMEN LF (CUSTOM PROCEDURE TRAY) ×2 IMPLANT
PAD OB MATERNITY 11 LF (PERSONAL CARE ITEMS) ×2 IMPLANT
PENCIL BUTTON HOLSTER BLD 10FT (ELECTRODE) IMPLANT
PENCIL SMOKE EVACUATOR (MISCELLANEOUS) IMPLANT
SCOPETTES 8 STERILE (MISCELLANEOUS) ×4 IMPLANT
SPONGE SURGIFOAM ABS GEL 12-7 (HEMOSTASIS) IMPLANT
SUT SILK 2 0 FSL 18 (SUTURE) ×2 IMPLANT
SUT VIC AB 0 CT1 27XBRD ANBCTR (SUTURE) ×4 IMPLANT
TOWEL GREEN STERILE FF (TOWEL DISPOSABLE) ×4 IMPLANT

## 2023-11-12 NOTE — Op Note (Addendum)
 11/12/2023  3:51 PM  PATIENT:  Gina Cervantes  63 y.o. female  PRE-OPERATIVE DIAGNOSIS:  Dysplasia of cervix, high grade CIN 2 High grade squamous intraepithelial lesion grade 3 CIN, on biopsy of cervix  POST-OPERATIVE DIAGNOSIS:  Dysplasia of cervix, high grade CIN 2High grade squamous intraepithelial lesion grade 3 CIN, on biopsy of cervix  PROCEDURE:  Procedure(s): Conization  SURGEON:  Ronal GORMAN Pinal  ASSISTANTS: OR staff.    ANESTHESIA:   general  ESTIMATED BLOOD LOSS: 20 mL  BLOOD ADMINISTERED:none   FLUIDS: 400cc LR  UOP: 50cc, drained with I&O cath before and after procedure  SPECIMEN:   conization of cervix with stitch at 12 o'clock, second piece at 6 o'clock.  ECC  DISPOSITION OF SPECIMEN:  PATHOLOGY  FINDINGS: atrophic vaginal tissue and vulvar radiation changes that are stable from prior exams, normal appearing cervix  DESCRIPTION OF OPERATION: Patient was taken to the operating room.  She is placed in the supine position. SCDs were on her lower extremities and functioning properly. General anesthesia with an LMA was administered without difficulty. Dr. Stoltzfus, anesthesia, oversaw case.  Legs were then placed in the [facility] stirrups in the low lithotomy position. The legs were lifted to the high lithotomy position and the Betadine  prep was used on the inner thighs perineum and vagina x3. Patient was draped in a normal standard fashion. An in and out catheterization with a red rubber Foley catheter was performed. Approximately 50 cc of clear urine was noted. A bivalve speculum was placed the vagina. The anterior lip of the cervix was grasped with single-tooth tenaculum.  Vasopressin  mixed 20 units in 100cc saline instilled in the cervix.  3 cc's instilled at the 3 and 6 o'clock portions of the cervix. Milex dilator used to identify the angle of there cervical canal.  Using a #11 blade, the conization was begun but incision around the cervical os in a clockwise fashion.   The cervical specimen was grasped with and Allis clamp and tissue retracted towards the surgeon.  The conization was continued with the #11 blade in a circumferential fashion and angling towards to cervical os.  Once the specimen was close to the os, the specimen was removed but cutting across the base with curved mayo scissors.  ECC was obtained above the conization specimen.  Hemostasis was achieved with Ball cautery and then a piece of surgicel was placed into the space where the conization had been performed.  The tenaculum was removed from the anterior lip of the cervix. The speculum was removed from the vagina. I&O cath performed a final time.  Clear but concentrated urine was noted.  The prep was cleansed of the patient's skin. The legs are positioned back in the supine position. Sponge, lap, needle, instrument counts were correct x2. Patient was taken to recovery in stable condition.  During the course of the conization, the surgeon dropped the knife on her leg, puncturing her own skin.  Gown and gloves were changed.  HIV and Hep C labs drawn on the pt.  Spouse will be notified as well as pt when I speak with her today or later this week.    COUNTS:  YES  PLAN OF CARE: Transfer to PACU

## 2023-11-12 NOTE — Transfer of Care (Addendum)
 Immediate Anesthesia Transfer of Care Note  Patient: Gina Cervantes  Procedure(s) Performed: Conization  Patient Location: PACU  Anesthesia Type:General  Level of Consciousness: drowsy  Airway & Oxygen  Therapy: Patient Spontanous Breathing and Patient connected to face mask oxygen   Post-op Assessment: Report given to RN and Post -op Vital signs reviewed and stable  Post vital signs: Reviewed and stable  Last Vitals:  Vitals Value Taken Time  BP 127/65   Temp    Pulse 97 11/12/23 15:47  Resp 13   SpO2 100 % 11/12/23 15:47  Vitals shown include unfiled device data.  Last Pain:  Vitals:   11/12/23 1250  PainSc: 0-No pain         Complications: No notable events documented.

## 2023-11-12 NOTE — Anesthesia Procedure Notes (Signed)
 Procedure Name: LMA Insertion Date/Time: 11/12/2023 2:43 PM  Performed by: Sharie Joesph BRAVO, RNPre-anesthesia Checklist: Patient identified, Emergency Drugs available, Suction available, Patient being monitored and Timeout performed Patient Re-evaluated:Patient Re-evaluated prior to induction Oxygen  Delivery Method: Circle system utilized Preoxygenation: Pre-oxygenation with 100% oxygen  Induction Type: IV induction LMA: LMA inserted LMA Size: 4.0 Number of attempts: 1

## 2023-11-12 NOTE — H&P (Signed)
 Gina Cervantes is an 63 y.o. female G2P2 MWF with ASCUS pap with +HR HPV and ECC showing CIN1.  LEEP then performed 06/21/2023 showing CIN3 with dysplasia prseent circumferentially in ectoand endocervical margins.  Chronic cervicitis with noted.  She has hx of rectal cancer with radiation and given concerns for bowel injury, conization in OR with ECC recommended.  Risks of not fully treating lesion present as well as risks for injury to rectum and bladder.  Discussed these risks as well as risks for bleeding and infection as well.  Questions answered.  Pt ready to proceed.    Pertinent Gynecological History: Menses: post-menopausal Bleeding: none Contraception: PMP DES exposure: denies Blood transfusions: x 2 done during chemo Sexually transmitted diseases: HR HPV Previous GYN Procedures: NSVD and C section x 1, LEEP in office  Last mammogram: normal Date: 04/17/2023 Last pap: abnormal: ASCUS with +HR HPV Date: 05/10/2023 OB History: G2, P2   Menstrual History: Patient's last menstrual period was 04/07/2008.    Past Medical History:  Diagnosis Date   Anal cancer (HCC) 08/09/2012   Squamous cell   Anxiety    PANIC ATTACKS   Arthritis    Breast cancer (HCC) 2009   ER+PR+HER-2+   CHF (congestive heart failure) (HCC)    COPD (chronic obstructive pulmonary disease) (HCC)    Ductal carcinoma (HCC) 02/2008   invasive    Dyspnea    Heart palpitations    History of radiation therapy 09/02/12-10/10/12   anal 50.4Gy   History of shingles 10/2014   HPV (human papilloma virus) anogenital infection    vulvar/ freezing hx   Hx of radiation therapy 2020   Lung nodule    Malignant neoplasm of upper lobe of left lung (HCC) 06/2016   Personal history of chemotherapy    Personal history of radiation therapy 2010   Pneumonia    NO RECENT PROBLEMS   PONV (postoperative nausea and vomiting)    Hypotension   Radiation 10/21/08-12/08/08   Right breast 6240 cGy   Shingles 2017   Status post  chemotherapy 02/2008   Taxol/Herceptin    Past Surgical History:  Procedure Laterality Date   BREAST BIOPSY Right 02/04/2008   malignant   BREAST LUMPECTOMY Right 2010   malignant   BREAST LUMPECTOMY WITH AXILLARY LYMPH NODE BIOPSY Right 5/10   Dr. Curvin   BRONCHIAL BIOPSY  12/28/2020   Procedure: BRONCHIAL BIOPSIES;  Surgeon: Brenna Adine CROME, DO;  Location: [facility];  Service: Pulmonary;;   BRONCHIAL BRUSHINGS  12/28/2020   Procedure: BRONCHIAL BRUSHINGS;  Surgeon: Brenna Adine CROME, DO;  Location: [facility];  Service: Pulmonary;;   BRONCHIAL NEEDLE ASPIRATION BIOPSY  12/28/2020   Procedure: BRONCHIAL NEEDLE ASPIRATION BIOPSIES;  Surgeon: Brenna Adine CROME, DO;  Location: [facility];  Service: Pulmonary;;   BRONCHIAL WASHINGS  12/28/2020   Procedure: BRONCHIAL WASHINGS;  Surgeon: Brenna Adine CROME, DO;  Location: [facility];  Service: Pulmonary;;   CESAREAN SECTION     COLONOSCOPY W/ POLYPECTOMY     ENDOBRONCHIAL ULTRASOUND  12/28/2020   Procedure: ENDOBRONCHIAL ULTRASOUND;  Surgeon: Brenna Adine CROME, DO;  Location: [facility];  Service: Pulmonary;;   EVALUATION UNDER ANESTHESIA WITH ANAL FISTULECTOMY N/A 08/09/2012   Procedure: EXAM UNDER ANESTHESIA AND BIOPSY OF ANAL MASS;  Surgeon: Krystal JINNY Russell, MD;  Location: [facility];  Service: General;  Laterality: N/A;   IR IMAGING GUIDED PORT INSERTION  01/12/2021   IR RADIOLOGIST EVAL & MGMT  03/11/2021   IR REMOVAL TUN ACCESS  W/ PORT W/O FL MOD SED  02/24/2021   KNEE ARTHROSCOPY Left    left knee   LUNG LOBECTOMY Left    PORTACATH PLACEMENT  03/2008   dr. curvin    REMOVAL PORTACATH  2011   RIGHT/LEFT HEART CATH AND CORONARY ANGIOGRAPHY N/A 04/06/2023   Procedure: RIGHT/LEFT HEART CATH AND CORONARY ANGIOGRAPHY;  Surgeon: Ladona Heinz, MD;  Location: [facility];  Service: Cardiovascular;  Laterality: N/A;   VIDEO ASSISTED THORACOSCOPY (VATS)/WEDGE RESECTION Left 07/05/2016   Procedure: VIDEO ASSISTED THORACOSCOPY (VATS)/WEDGE  RESECTION left upper lobe,  lymph node dissection and placement of OnQ catheter;  Surgeon: Dallas KATHEE Jude, MD;  Location: [facility] OR;  Service: Thoracic;  Laterality: Left;   VIDEO BRONCHOSCOPY N/A 07/05/2016   Procedure: VIDEO BRONCHOSCOPY;  Surgeon: Dallas KATHEE Jude, MD;  Location: [facility] OR;  Service: Thoracic;  Laterality: N/A;   VIDEO BRONCHOSCOPY N/A 05/03/2018   Procedure: VIDEO BRONCHOSCOPY;  Surgeon: Jude Dallas KATHEE, MD;  Location: [facility] OR;  Service: Thoracic;  Laterality: N/A;   VIDEO BRONCHOSCOPY WITH ENDOBRONCHIAL NAVIGATION N/A 05/03/2018   Procedure: VIDEO BRONCHOSCOPY WITH ENDOBRONCHIAL NAVIGATION WITH BIOPSY;  Surgeon: Jude Dallas KATHEE, MD;  Location: [facility] OR;  Service: Thoracic;  Laterality: N/A;   VIDEO BRONCHOSCOPY WITH ENDOBRONCHIAL NAVIGATION Bilateral 12/28/2020   Procedure: VIDEO BRONCHOSCOPY WITH ENDOBRONCHIAL NAVIGATION;  Surgeon: Brenna Adine CROME, DO;  Location: [facility];  Service: Pulmonary;  Laterality: Bilateral;  ION   VIDEO BRONCHOSCOPY WITH ENDOBRONCHIAL ULTRASOUND N/A 05/03/2018   Procedure: VIDEO BRONCHOSCOPY WITH ENDOBRONCHIAL ULTRASOUND;  Surgeon: Jude Dallas KATHEE, MD;  Location: [facility] OR;  Service: Thoracic;  Laterality: N/A;   VIDEO BRONCHOSCOPY WITH RADIAL ENDOBRONCHIAL ULTRASOUND  12/28/2020   Procedure: RADIAL ENDOBRONCHIAL ULTRASOUND;  Surgeon: Brenna Adine CROME, DO;  Location: [facility];  Service: Pulmonary;;    Family History  Problem Relation Age of Onset   Lung cancer Father    Stroke Mother    Breast cancer Neg Hx     Social History:  reports that she quit smoking about 10 years ago. Her smoking use included cigarettes. She started smoking about 18 years ago. She has never used smokeless tobacco. She reports current alcohol use of about 14.0 standard drinks of alcohol per week. She reports that she does not use drugs.  Allergies: No Known Allergies  Medications Prior to Admission  Medication Sig Dispense Refill Last Dose/Taking   albuterol   (VENTOLIN  HFA) 108 (90 Base) MCG/ACT inhaler INHALE 2 PUFFS BY MOUTH EVERY 6 HOURS AS NEEDED FOR WHEEZING OR SHORTNESS OF BREATH 8.5 g 1 11/11/2023   budesonide -glycopyrrolate -formoterol  (BREZTRI  AEROSPHERE) 160-9-4.8 MCG/ACT AERO inhaler Inhale 2 puffs into the lungs in the morning and at bedtime. 10.7 g 6 11/12/2023 at 10:30 AM   Multiple Vitamins-Minerals (MULTIVITAMIN WITH MINERALS) tablet Take 3 tablets by mouth daily. Woman 50+   Past Week   OMEGA-3 FATTY ACIDS PO Take 1,055 mg by mouth daily. Fish oil 1250   Past Week   furosemide  (LASIX ) 20 MG tablet Take 20 mg by mouth daily as needed for fluid or edema.   More than a month   loratadine  (CLARITIN ) 10 MG tablet Take 10 mg by mouth daily as needed for allergies (Pollen Season).   More than a month    Review of Systems  Constitutional: Negative.   Respiratory: Negative.    Cardiovascular: Negative.   Psychiatric/Behavioral: Negative.      Blood pressure (!) 155/85, pulse 92, temperature 97.9 F (36.6 C), resp. rate 18, height  5' 5 (1.651 m), weight 69.9 kg, last menstrual period 04/07/2008, SpO2 96%. Physical Exam Constitutional:      Appearance: Normal appearance.  Cardiovascular:     Rate and Rhythm: Normal rate and regular rhythm.  Pulmonary:     Effort: Pulmonary effort is normal.     Breath sounds: Normal breath sounds.  Neurological:     General: No focal deficit present.     Mental Status: She is alert.  Psychiatric:        Mood and Affect: Mood normal.     Results for orders placed or performed during the hospital encounter of 11/12/23 (from the past 24 hours)  CBC     Status: Abnormal   Collection Time: 11/12/23 12:08 PM  Result Value Ref Range   WBC 5.7 4.0 - 10.5 K/uL   RBC 4.34 3.87 - 5.11 MIL/uL   Hemoglobin 15.6 (H) 12.0 - 15.0 g/dL   HCT 53.7 (H) 63.9 - 53.9 %   MCV 106.5 (H) 80.0 - 100.0 fL   MCH 35.9 (H) 26.0 - 34.0 pg   MCHC 33.8 30.0 - 36.0 g/dL   RDW 86.7 88.4 - 84.4 %   Platelets 254 150 - 400 K/uL    nRBC 0.0 0.0 - 0.2 %  Type and screen     Status: None (Preliminary result)   Collection Time: 11/12/23 12:43 PM  Result Value Ref Range   ABO/RH(D) PENDING    Antibody Screen PENDING    Sample Expiration      11/15/2023,2359 Performed at [facility], [address] [place], [place] [address]     No results found.  Assessment/Plan: 63 yo G2P2 MWF with h/o CIN3 with +ECC here for conization of cervix and ECC in OR.  Questions answered.  Risks and benefits discussed.  Pt ready to proceed.    Ronal GORMAN Pinal 11/12/2023, 1:06 PM

## 2023-11-12 NOTE — Discharge Instructions (Addendum)
  Post Anesthesia Home Care Instructions  Activity: Get plenty of rest for the remainder of the day. A responsible adult should stay with you for 24 hours following the procedure.  For the next 24 hours, DO NOT: -Drive a car -Advertising copywriter -Drink alcoholic beverages -Take any medication unless instructed by your physician -Make any legal decisions or sign important papers.  Meals: Start with liquid foods such as gelatin or soup. Progress to regular foods as tolerated. Avoid greasy, spicy, heavy foods. If nausea and/or vomiting occur, drink only clear liquids until the nausea and/or vomiting subsides. Call your physician if vomiting continues.  Special Instructions/Symptoms: Your throat may feel dry or sore from the anesthesia or the breathing tube placed in your throat during surgery. If this causes discomfort, gargle with warm salt water. The discomfort should disappear within 24 hours.   Post-surgical Instructions, Outpatient Surgery  You may expect to feel dizzy, weak, and drowsy for as long as 24 hours after receiving the medicine that made you sleep (anesthetic). For the first 24 hours after your surgery:   Do not drive a car, ride a bicycle, participate in physical activities, or take public transportation until you are done taking narcotic pain medicines or as directed by Dr. Cleotilde.  Do not drink alcohol or take tranquilizers.  Do not take medicine that has not been prescribed by your physicians.  Do not sign important papers or make important decisions while on narcotic pain medicines.  Have a responsible person with you.   PAIN MANAGEMENT You can take two extra- strength Tylenol  (1000mg  total) every 6 hours or 800mg  ibuprofen every 8 hours as needed for pain/cramping.   Vicodin 5/325mg .  For more severe pain, take one or two tablets every four to six hours as needed for pain control.  (Remember that narcotic pain medications increase your risk of constipation.  If this  becomes a problem, you may take an over the counter stool softener like Colace 100mg  up to four times a day.)  If you take some of the narcotic, you should skip the tylenol  dosage.    DO'S AND DON'T'S Do not take a tub bath for one week.  You may shower on the first day after your surgery Do not do any heavy lifting for 4 weeks.  This increases the chance of bleeding. Do move around as you feel able.  Stairs are fine.  You may begin to exercise again as you feel able.  Do not lift any weights for two weeks. Do not put anything in the vagina for 4 weeks--no tampons, intercourse, or douching.    REGULAR MEDIATIONS/VITAMINS: You may restart all of your regular medications as prescribed. You may restart all of your vitamins as you normally take them.    PLEASE CALL OR SEEK MEDICAL CARE IF: You have persistent nausea and vomiting.  You have trouble eating or drinking.  You have an oral temperature above 100.5.  You have constipation that is not helped by adjusting diet or increasing fluid intake. Pain medicines are a common cause of constipation.  You have heavy vaginal bleeding

## 2023-11-13 ENCOUNTER — Encounter (HOSPITAL_COMMUNITY): Payer: Self-pay | Admitting: Obstetrics & Gynecology

## 2023-11-13 NOTE — Anesthesia Postprocedure Evaluation (Signed)
 Anesthesia Post Note  Patient: Gina Cervantes  Procedure(s) Performed: Conization     Patient location during evaluation: PACU Anesthesia Type: General Level of consciousness: awake and alert Pain management: pain level controlled Vital Signs Assessment: post-procedure vital signs reviewed and stable Respiratory status: spontaneous breathing, nonlabored ventilation, respiratory function stable and patient connected to nasal cannula oxygen  Cardiovascular status: blood pressure returned to baseline and stable Postop Assessment: no apparent nausea or vomiting Anesthetic complications: no   No notable events documented.  Last Vitals:  Vitals:   11/12/23 1632 11/12/23 1639  BP: (!) 113/58   Pulse: 89 73  Resp: 17 17  Temp:    SpO2: 96% 97%    Last Pain:  Vitals:   11/12/23 1639  PainSc: 2                  [name]

## 2023-11-14 LAB — SURGICAL PATHOLOGY

## 2023-11-15 DIAGNOSIS — I5082 Biventricular heart failure: Secondary | ICD-10-CM | POA: Diagnosis not present

## 2023-12-13 ENCOUNTER — Encounter (HOSPITAL_BASED_OUTPATIENT_CLINIC_OR_DEPARTMENT_OTHER): Payer: Self-pay | Admitting: Obstetrics & Gynecology

## 2023-12-13 ENCOUNTER — Ambulatory Visit (INDEPENDENT_AMBULATORY_CARE_PROVIDER_SITE_OTHER): Payer: Self-pay | Admitting: Obstetrics & Gynecology

## 2023-12-13 VITALS — BP 94/56 | HR 85 | Ht 65.0 in | Wt 151.6 lb

## 2023-12-13 DIAGNOSIS — D069 Carcinoma in situ of cervix, unspecified: Secondary | ICD-10-CM

## 2023-12-13 DIAGNOSIS — C3490 Malignant neoplasm of unspecified part of unspecified bronchus or lung: Secondary | ICD-10-CM

## 2023-12-13 DIAGNOSIS — Z853 Personal history of malignant neoplasm of breast: Secondary | ICD-10-CM

## 2023-12-13 DIAGNOSIS — Z85048 Personal history of other malignant neoplasm of rectum, rectosigmoid junction, and anus: Secondary | ICD-10-CM

## 2023-12-13 DIAGNOSIS — N952 Postmenopausal atrophic vaginitis: Secondary | ICD-10-CM

## 2023-12-13 DIAGNOSIS — Z9889 Other specified postprocedural states: Secondary | ICD-10-CM

## 2023-12-13 MED ORDER — ESTRADIOL 0.1 MG/GM VA CREA: TOPICAL_CREAM | VAGINAL | 1 refills | Status: DC

## 2023-12-13 NOTE — Progress Notes (Unsigned)
 GYNECOLOGY  VISIT  CC:   post op recheck  HPI: 63 y.o. G4P0021 Married White or Caucasian female here for recheck after undergoing conization on 11/12/2023.  Did not have any significant bleeding but still having a little watery, pinkish discharge.  Her margins are positive.  She understands additional treatment is needed, either repeat conization or hysterectomy.  Given rectal cancer hx, she also understands there is risk for scarring between the uterus/cervix and colon.  Denies pain.  She requests seeing gyn/oncology for consult.    MEDS:   Current Outpatient Medications on File Prior to Visit  Medication Sig Dispense Refill   albuterol  (VENTOLIN  HFA) 108 (90 Base) MCG/ACT inhaler INHALE 2 PUFFS BY MOUTH EVERY 6 HOURS AS NEEDED FOR WHEEZING OR SHORTNESS OF BREATH 8.5 g 1   budesonide -glycopyrrolate -formoterol  (BREZTRI  AEROSPHERE) 160-9-4.8 MCG/ACT AERO inhaler Inhale 2 puffs into the lungs in the morning and at bedtime. 10.7 g 6   furosemide  (LASIX ) 20 MG tablet Take 20 mg by mouth daily as needed for fluid or edema.     HYDROcodone -acetaminophen  (NORCO/VICODIN) 5-325 MG tablet Take 1-2 tablets by mouth every 6 (six) hours as needed for moderate pain (pain score 4-6). 12 tablet 0   loratadine  (CLARITIN ) 10 MG tablet Take 10 mg by mouth daily as needed for allergies (Pollen Season).     Multiple Vitamins-Minerals (MULTIVITAMIN WITH MINERALS) tablet Take 3 tablets by mouth daily. Woman 50+     OMEGA-3 FATTY ACIDS PO Take 1,055 mg by mouth daily. Fish oil 1250     No current facility-administered medications on file prior to visit.    SH:  Smoking No    PHYSICAL EXAMINATION:    BP (!) 94/56   Pulse 85   Ht 5' 5 (1.651 m)   Wt 151 lb 9.6 oz (68.8 kg)   LMP 04/07/2008   BMI 25.23 kg/m     General appearance: alert, cooperative and appears stated age  Pelvic: External genitalia:  no lesions              Urethra:  normal appearing urethra with no masses, tenderness or lesions               Bartholins and Skenes: normal                 Vagina: normal appearing vagina with normal color and discharge, no lesions              Cervix: no lesions and healing well, posterior lip of cervix is thin  Assessment/Plan: 1. History of conization of cervix (Primary) - pt requests seeing Gyn/oncology for consult and discussion of additional management  2. High grade squamous intraepithelial lesion (HGSIL), grade 3 CIN, on biopsy of cervix  3. History of anal cancer  4. Vaginal atrophy - recommend starting vaginal estrogen cream 1 gram pv twice weekly for healing and in preparation for next procedure.  Rx to pharmacy. - estradiol  (ESTRACE ) 0.1 MG/GM vaginal cream; 1 gram vaginally twice weekly  Dispense: 42.5 g; Refill: 1  5. Non-small cell lung cancer with metastasis (HCC)  6. History of breast cancer

## 2023-12-14 ENCOUNTER — Ambulatory Visit (HOSPITAL_BASED_OUTPATIENT_CLINIC_OR_DEPARTMENT_OTHER): Payer: Self-pay | Admitting: Obstetrics & Gynecology

## 2023-12-16 DIAGNOSIS — I5082 Biventricular heart failure: Secondary | ICD-10-CM | POA: Diagnosis not present

## 2023-12-18 ENCOUNTER — Telehealth: Payer: Self-pay

## 2023-12-18 ENCOUNTER — Encounter: Payer: Self-pay | Admitting: Emergency Medicine

## 2023-12-18 NOTE — Telephone Encounter (Signed)
 Spoke with the patient regarding the referral to GYN oncology. Patient scheduled as new patient with Dr Eldonna on 8/25/20025. Patient given an arrival time of 10:00am.  Explained to the patient the the doctor will perform a pelvic exam at this visit. Patient given the policy that only one visitor allowed and that visitor must be over 16 yrs are allowed in the Cancer Center. Patient given the address/phone number for the clinic and that the center offers free valet service. Patient aware that masks required.

## 2023-12-20 ENCOUNTER — Telehealth: Payer: Self-pay | Admitting: *Deleted

## 2023-12-20 ENCOUNTER — Encounter: Payer: Self-pay | Admitting: Gynecologic Oncology

## 2023-12-20 MED ORDER — ALBUTEROL SULFATE HFA 108 (90 BASE) MCG/ACT IN AERS: INHALATION_SPRAY | RESPIRATORY_TRACT | 1 refills | Status: DC

## 2023-12-20 NOTE — Telephone Encounter (Signed)
 Spoke with the patient and moved appt from 8/25 to 8/15

## 2023-12-20 NOTE — Progress Notes (Addendum)
 GYNECOLOGIC ONCOLOGY NEW PATIENT CONSULTATION   Patient Name: Gina Cervantes  Patient Age: 63 y.o. Date of Service: 12/21/23 Referring Provider: Elvie Pinal, MD  Primary Care Provider: Arloa Elsie [name], MD Consulting Provider: Comer Dollar, MD   Assessment/Plan:  Postmenopausal patient with HPV-related cervical dysplasia.  We reviewed her recent cervical dysplasia history.  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  Interestingly, she has tested positive for HPV on Pap test since 2019 but has been negative for the highest oncogenic risk types.  We discussed in detail pathology report from her recent cold knife conization.  This showed high-grade dysplasia without evidence of invasive cancer.  Portion of the endocervical as well as the ectocervical margin were positive on the anterior aspect of the specimen.  I reviewed typical treatment for high-grade cervical dysplasia.  We discussed that hysterectomy is not first-line treatment for somebody with cervical dysplasia given the risk of recurrence of HPV related disease on the vagina after hysterectomy and that vaginal dysplasia can be harder to follow and harder to treat.  Sometimes hysterectomy is recommended if high-grade dysplasia recurs after excisional treatment and there is not adequate cervix for repeat excision.  In the setting of her positive cone margins, either repeat excision or close surveillance are acceptable options.  Given her history, I would favor re-excision.  I think this could be accomplished with a shallow and narrow repeat excision as long as she heals from recent procedure and there appears to be adequate cervical tissue.  Given somewhat slower healing, likely in the setting of her prior radiation and menopause, plan to see her back in 1 month to reassess the cervix.  Based on my exam today, I do think that there is enough cervix for a small repeat excision.  We will tentatively plan  for a repeat cold knife cone several weeks after her next visit with me.  Discussed that her anal cancer was HPV related (similar to her current cervical dysplasia). The patient has some recollection of undergoing genetic testing.  On my review, I do not see germline genetic testing results.  I will reach out to our genetic counselors.    A copy of this note was sent to the patient's referring provider.   65 minutes of total time was spent for this patient encounter, including preparation, face-to-face counseling with the patient and coordination of care, and documentation of the encounter.  Comer Dollar, MD  Division of Gynecologic Oncology  Department of Obstetrics and Gynecology  [facility]  ___________________________________________  Chief Complaint: Chief Complaint  Patient presents with   High grade squamous intraepithelial lesion (HGSIL), grade 3    History of Present Illness:  Gina Cervantes is a 63 y.o. y.o. female who is seen in consultation at the request of Dr. Pinal for an evaluation of high grade cervical dysplasia.  Below is the patient's cytology and dysplasia history: 11/2013 - NILM pap 12/2014 - NILM pap 04/2016 - NILM pap 07/2017 - NILM pap, HR HPV+ (negative 16/18/45) 11/2018 - NILM pap, HR HPV+ (negative 16/18/45) 12/2018 - ECC with fragments of benign transitional zone mucosa, no malignancy 12/2019 - NILM pap, HR HPV+ (negative 16/18/45) 12/2019 - ECC with low-grade dysplasia, benign endocervical epithelium 12/2020 - NILM pap, HR HPV+ (negative 16/18/45) 12/2021 - NILM pap, HR HPV+ (negative 16/18/45) 05/2022 -ECC with focal scant fragments of ectocervix and squamous mucosa with low-grade dysplasia in the background of predominantly mucus and unremarkable endocervix  05/2023 - ASCUS pap, HR HPV+ (negative 16/18/45) 05/2023 - ECC with low-grade dysplasia 06/2023: LEEP, ECC Pathology revealed CIN-2-3 with low-grade dysplasia and HPV  effect, glandular extension.  Dysplasia present circumferentially in the ectocervical and endocervical margins.  Deep stromal margin free.  ECC with detached fragments of high-grade dysplasia. 11/12/2023: Cold knife conization, ECC Pathology revealed CIN-2-3 along the anterior cone specimen with focal extension into the underlying endocervical glands involving 3-6:00, 6-9:00, and 9-12:00 quadrant.  No invasive carcinoma.  High-grade dysplasia involves endocervical margin from 3-6 o'clock and the ectocervical margin from 9-12:00.  The posterior cone specimen shows low-grade dysplasia.  ECC with predominantly blood and fibrin material, minute fragments of detached endocervical glandular cells.  Patient is overall doing well after her recent procedure.  She denies any vaginal bleeding, discharge, or pelvic pain.  She endorses normal bowel bladder function.  Reports good appetite without nausea or emesis.  Denies any recent weight changes.  Her history is notable for remote anal and breast cancer as well as non-small cell lung cancer.  In terms of her anal cancer, she underwent concurrent radiation and chemotherapy completed in 2014.  For her breast cancer, which was diagnosed in 2009 and triple positive, she had neoadjuvant chemotherapy followed by surgery and radiation.  She was then on antiestrogen therapy with tamoxifen and ultimately Arimidex  which was continued until 2019.    She also has a history of heart failure, pulmonary hypertension.  PAST MEDICAL HISTORY:  Past Medical History:  Diagnosis Date   Anal cancer (HCC) 08/09/2012   Squamous cell   Anxiety    PANIC ATTACKS   Arthritis    Breast cancer (HCC) 2009   ER+PR+HER-2+   CHF (congestive heart failure) (HCC)    COPD (chronic obstructive pulmonary disease) (HCC)    Ductal carcinoma (HCC) 02/2008   invasive    Dyspnea    Heart palpitations    History of MRSA infection    History of radiation therapy 09/02/12-10/10/12   anal 50.4Gy    History of shingles 10/2014   HPV (human papilloma virus) anogenital infection    vulvar/ freezing hx   Hx of radiation therapy 2020   Lung nodule    Malignant neoplasm of upper lobe of left lung (HCC) 06/2016   Personal history of chemotherapy    Personal history of radiation therapy 2010   Pneumonia    NO RECENT PROBLEMS   PONV (postoperative nausea and vomiting)    Hypotension   Radiation 10/21/08-12/08/08   Right breast 6240 cGy   Shingles 2017   Status post chemotherapy 02/2008   Taxol/Herceptin     PAST SURGICAL HISTORY:  Past Surgical History:  Procedure Laterality Date   BREAST BIOPSY Right 02/04/2008   malignant   BREAST LUMPECTOMY Right 2010   malignant   BREAST LUMPECTOMY WITH AXILLARY LYMPH NODE BIOPSY Right 5/10   Dr. Curvin   BRONCHIAL BIOPSY  12/28/2020   Procedure: BRONCHIAL BIOPSIES;  Surgeon: Brenna Adine CROME, DO;  Location: [facility];  Service: Pulmonary;;   BRONCHIAL BRUSHINGS  12/28/2020   Procedure: BRONCHIAL BRUSHINGS;  Surgeon: Brenna Adine CROME, DO;  Location: [facility];  Service: Pulmonary;;   BRONCHIAL NEEDLE ASPIRATION BIOPSY  12/28/2020   Procedure: BRONCHIAL NEEDLE ASPIRATION BIOPSIES;  Surgeon: Brenna Adine CROME, DO;  Location: [facility];  Service: Pulmonary;;   BRONCHIAL WASHINGS  12/28/2020   Procedure: BRONCHIAL WASHINGS;  Surgeon: Brenna Adine CROME, DO;  Location: [facility];  Service: Pulmonary;;   CERVICAL CONIZATION W/BX  N/A 11/12/2023   Procedure: Conization;  Surgeon: Cleotilde Ronal RAMAN, MD;  [facility];  Service: Gynecology;  Laterality: N/A;   CESAREAN SECTION     COLONOSCOPY W/ POLYPECTOMY     ENDOBRONCHIAL ULTRASOUND  12/28/2020   Procedure: ENDOBRONCHIAL ULTRASOUND;  Surgeon: Brenna Adine CROME, DO;  Location: [facility];  Service: Pulmonary;;   EVALUATION UNDER ANESTHESIA WITH ANAL FISTULECTOMY N/A 08/09/2012   Procedure: EXAM UNDER ANESTHESIA AND BIOPSY OF ANAL MASS;  Surgeon: Krystal JINNY Russell, MD;  Location: [facility];  Service:  General;  Laterality: N/A;   IR IMAGING GUIDED PORT INSERTION  01/12/2021   IR RADIOLOGIST EVAL & MGMT  03/11/2021   IR REMOVAL TUN ACCESS W/ PORT W/O FL MOD SED  02/24/2021   KNEE ARTHROSCOPY Left    left knee   LUNG LOBECTOMY Left    PORTACATH PLACEMENT  03/2008   dr. curvin    REMOVAL PORTACATH  2011   RIGHT/LEFT HEART CATH AND CORONARY ANGIOGRAPHY N/A 04/06/2023   Procedure: RIGHT/LEFT HEART CATH AND CORONARY ANGIOGRAPHY;  Surgeon: Ladona Heinz, MD;  Location: [facility];  Service: Cardiovascular;  Laterality: N/A;   VIDEO ASSISTED THORACOSCOPY (VATS)/WEDGE RESECTION Left 07/05/2016   Procedure: VIDEO ASSISTED THORACOSCOPY (VATS)/WEDGE RESECTION left upper lobe,  lymph node dissection and placement of OnQ catheter;  Surgeon: [place] KATHEE Jude, MD;  Location: [facility] OR;  Service: Thoracic;  Laterality: Left;   VIDEO BRONCHOSCOPY N/A 07/05/2016   Procedure: VIDEO BRONCHOSCOPY;  Surgeon: Dallas KATHEE Jude, MD;  Location: [facility] OR;  Service: Thoracic;  Laterality: N/A;   VIDEO BRONCHOSCOPY N/A 05/03/2018   Procedure: VIDEO BRONCHOSCOPY;  Surgeon: Jude Dallas KATHEE, MD;  Location: [facility] OR;  Service: Thoracic;  Laterality: N/A;   VIDEO BRONCHOSCOPY WITH ENDOBRONCHIAL NAVIGATION N/A 05/03/2018   Procedure: VIDEO BRONCHOSCOPY WITH ENDOBRONCHIAL NAVIGATION WITH BIOPSY;  Surgeon: Jude Dallas KATHEE, MD;  Location: [facility] OR;  Service: Thoracic;  Laterality: N/A;   VIDEO BRONCHOSCOPY WITH ENDOBRONCHIAL NAVIGATION Bilateral 12/28/2020   Procedure: VIDEO BRONCHOSCOPY WITH ENDOBRONCHIAL NAVIGATION;  Surgeon: Brenna Adine CROME, DO;  Location: [facility];  Service: Pulmonary;  Laterality: Bilateral;  ION   VIDEO BRONCHOSCOPY WITH ENDOBRONCHIAL ULTRASOUND N/A 05/03/2018   Procedure: VIDEO BRONCHOSCOPY WITH ENDOBRONCHIAL ULTRASOUND;  Surgeon: Jude Dallas KATHEE, MD;  Location: [facility] OR;  Service: Thoracic;  Laterality: N/A;   VIDEO BRONCHOSCOPY WITH RADIAL ENDOBRONCHIAL ULTRASOUND  12/28/2020   Procedure: RADIAL  ENDOBRONCHIAL ULTRASOUND;  Surgeon: Brenna Adine CROME, DO;  Location: [facility];  Service: Pulmonary;;    OB/GYN HISTORY:  OB History  Gravida Para Term Preterm AB Living  4 2   2 1   SAB IAB Ectopic Multiple Live Births  2    2    # Outcome Date GA Lbr Len/2nd Weight Sex Type Anes PTL Lv  4 Para 01/1992    M Vag-Spont   LIV  3 Para 07/1988    M CS-Unspec   LIV  2 SAB           1 SAB             Obstetric Comments  G2,P2  menarce age 22  Oldest son passed 2014    Patient's last menstrual period was 04/07/2008.  Age at menarche: 27  Age at menopause: 9 Hx of HRT: denies Hx of STDs: HPV Last pap: see HPI History of abnormal pap smears: see HPI  SCREENING STUDIES:  Last mammogram: 2024  Last colonoscopy: 2023 Last bone mineral density: 2016  MEDICATIONS:  Outpatient Encounter Medications as of 12/21/2023  Medication Sig   albuterol (VENTOLIN HFA) 108 (90 Base) MCG/ACT inhaler INHALE 2 PUFFS BY MOUTH EVERY 6 HOURS AS NEEDED FOR WHEEZING OR SHORTNESS OF BREATH   budesonide-glycopyrrolate-formoterol (BREZTRI AEROSPHERE) 160-9-4.8 MCG/ACT AERO inhaler Inhale 2 puffs into the lungs in the morning and at bedtime.   estradiol (ESTRACE) 0.1 MG/GM vaginal cream 1 gram vaginally twice weekly   furosemide (LASIX) 20 MG tablet Take 20 mg by mouth daily as needed for fluid or edema.   loratadine (CLARITIN) 10 MG tablet Take 10 mg by mouth daily as needed for allergies (Pollen Season).   Multiple Vitamins-Minerals (MULTIVITAMIN WITH MINERALS) tablet Take 3 tablets by mouth daily. Woman 50+   OMEGA-3 FATTY ACIDS PO Take 1,055 mg by mouth daily. Fish oil 1250   No facility-administered encounter medications on file as of 12/21/2023.    ALLERGIES:  No Known Allergies   FAMILY HISTORY:  Family History  Problem Relation Age of Onset   Lung cancer Father    Stroke Mother    Breast cancer Neg Hx      SOCIAL HISTORY:  Social Connections: Not on file    REVIEW OF SYSTEMS:  +  bruising/bleeding easily, anxiety Denies appetite changes, fevers, chills, fatigue, unexplained weight changes. Denies hearing loss, neck lumps or masses, mouth sores, ringing in ears or voice changes. Denies cough or wheezing.  Denies shortness of breath. Denies chest pain or palpitations. Denies leg swelling. Denies abdominal distention, pain, blood in stools, constipation, diarrhea, nausea, vomiting, or early satiety. Denies pain with intercourse, dysuria, frequency, hematuria or incontinence. Denies hot flashes, pelvic pain, vaginal bleeding or vaginal discharge.   Denies joint pain, back pain or muscle pain/cramps. Denies itching, rash, or wounds. Denies dizziness, headaches, numbness or seizures. Denies swollen lymph nodes or glands. Denies depression, confusion, or decreased concentration.  Physical Exam:  Vital Signs for this encounter:  Blood pressure 129/65, pulse (!) 105, temperature 98.4 F (36.9 C), temperature source Oral, height 5' 5 (1.651 m), weight 150 lb (68 kg), last menstrual period 04/07/2008, SpO2 93%. Body mass index is 24.96 kg/m. General: Alert, oriented, no acute distress.  HEENT: Normocephalic, atraumatic. Sclera anicteric.  Chest: Clear to auscultation bilaterally. No wheezes, rhonchi, or rales. Cardiovascular: Regular rate and rhythm, no murmurs, rubs, or gallops.  Abdomen: Normoactive bowel sounds. Soft, nondistended, nontender to palpation. No masses or hepatosplenomegaly appreciated. No palpable fluid wave.  Extremities: Grossly normal range of motion. Warm, well perfused. No edema bilaterally.  Skin: No rashes or lesions.  Lymphatics: No cervical, supraclavicular, or inguinal adenopathy.  GU:  Normal external female genitalia. No lesions. No discharge or bleeding.             Bladder/urethra:  No lesions or masses, well supported bladder             Vagina: Moderate vaginal atrophy, no vaginal lesions.             Cervix: Healing well after cone bed  although still with central defect.  No firmness or nodularity of the cervix on bimanual exam.             Uterus: Small, mobile, no parametrial involvement or nodularity.             Adnexa: No masses appreciated.  Rectal: Deferred.  LABORATORY AND RADIOLOGIC DATA:  Outside medical records were reviewed to synthesize the above history, along with the history and physical obtained during the visit.   Lab  Results  Component Value Date   WBC 5.7 11/12/2023   HGB 15.6 (H) 11/12/2023   HCT 46.2 (H) 11/12/2023   PLT 254 11/12/2023   GLUCOSE 101 (H) 11/12/2023   CHOL 224 (H) 12/05/2018   TRIG 65 12/05/2018   HDL 87 12/05/2018   LDLCALC 124 (H) 12/05/2018   ALT 12 07/23/2023   AST 16 07/23/2023   NA 138 11/12/2023   K 4.7 11/12/2023   CL 96 (L) 11/12/2023   CREATININE 0.87 11/12/2023   BUN 14 11/12/2023   CO2 29 11/12/2023   TSH 1.560 07/23/2023   INR 1.04 06/07/2018   HGBA1C 5.8 (H) 12/05/2018

## 2023-12-21 ENCOUNTER — Inpatient Hospital Stay: Attending: Gynecologic Oncology | Admitting: Gynecologic Oncology

## 2023-12-21 ENCOUNTER — Encounter: Payer: Self-pay | Admitting: Gynecologic Oncology

## 2023-12-21 VITALS — BP 129/65 | HR 105 | Temp 98.4°F | Ht 65.0 in | Wt 150.0 lb

## 2023-12-21 DIAGNOSIS — B977 Papillomavirus as the cause of diseases classified elsewhere: Secondary | ICD-10-CM | POA: Insufficient documentation

## 2023-12-21 DIAGNOSIS — Z85048 Personal history of other malignant neoplasm of rectum, rectosigmoid junction, and anus: Secondary | ICD-10-CM | POA: Insufficient documentation

## 2023-12-21 DIAGNOSIS — R87613 High grade squamous intraepithelial lesion on cytologic smear of cervix (HGSIL): Secondary | ICD-10-CM | POA: Insufficient documentation

## 2023-12-21 DIAGNOSIS — Z85118 Personal history of other malignant neoplasm of bronchus and lung: Secondary | ICD-10-CM | POA: Diagnosis not present

## 2023-12-21 DIAGNOSIS — Z8614 Personal history of Methicillin resistant Staphylococcus aureus infection: Secondary | ICD-10-CM | POA: Diagnosis not present

## 2023-12-21 DIAGNOSIS — Z923 Personal history of irradiation: Secondary | ICD-10-CM | POA: Diagnosis not present

## 2023-12-21 DIAGNOSIS — Z853 Personal history of malignant neoplasm of breast: Secondary | ICD-10-CM | POA: Insufficient documentation

## 2023-12-21 DIAGNOSIS — Z79899 Other long term (current) drug therapy: Secondary | ICD-10-CM | POA: Insufficient documentation

## 2023-12-21 DIAGNOSIS — J449 Chronic obstructive pulmonary disease, unspecified: Secondary | ICD-10-CM | POA: Diagnosis not present

## 2023-12-21 DIAGNOSIS — Z801 Family history of malignant neoplasm of trachea, bronchus and lung: Secondary | ICD-10-CM | POA: Insufficient documentation

## 2023-12-21 DIAGNOSIS — Z9221 Personal history of antineoplastic chemotherapy: Secondary | ICD-10-CM | POA: Insufficient documentation

## 2023-12-21 DIAGNOSIS — I509 Heart failure, unspecified: Secondary | ICD-10-CM | POA: Insufficient documentation

## 2023-12-21 DIAGNOSIS — D069 Carcinoma in situ of cervix, unspecified: Secondary | ICD-10-CM | POA: Diagnosis present

## 2023-12-21 NOTE — Patient Instructions (Addendum)
 Plan to come back to the office in one month to meet with Dr. Viktoria and have a repeat exam.   We will tentatively hold surgery time on September 17 or 18. You may receive a phone call from the hospital to arrange for a preop appt. This should take place after you see Dr. Viktoria on Sept 12 and can always be adjusted based on the recommendations at your next visit.

## 2023-12-31 ENCOUNTER — Ambulatory Visit: Admitting: Psychiatry

## 2024-01-18 ENCOUNTER — Encounter: Payer: Self-pay | Admitting: Gynecologic Oncology

## 2024-01-18 ENCOUNTER — Inpatient Hospital Stay: Attending: Gynecologic Oncology | Admitting: Gynecologic Oncology

## 2024-01-18 VITALS — BP 113/76 | HR 91 | Temp 98.7°F | Resp 19 | Wt 150.6 lb

## 2024-01-18 DIAGNOSIS — Z85048 Personal history of other malignant neoplasm of rectum, rectosigmoid junction, and anus: Secondary | ICD-10-CM

## 2024-01-18 DIAGNOSIS — C3412 Malignant neoplasm of upper lobe, left bronchus or lung: Secondary | ICD-10-CM | POA: Diagnosis not present

## 2024-01-18 DIAGNOSIS — A630 Anogenital (venereal) warts: Secondary | ICD-10-CM | POA: Diagnosis not present

## 2024-01-18 DIAGNOSIS — N879 Dysplasia of cervix uteri, unspecified: Secondary | ICD-10-CM

## 2024-01-18 DIAGNOSIS — D069 Carcinoma in situ of cervix, unspecified: Secondary | ICD-10-CM

## 2024-01-18 DIAGNOSIS — B977 Papillomavirus as the cause of diseases classified elsewhere: Secondary | ICD-10-CM | POA: Insufficient documentation

## 2024-01-18 DIAGNOSIS — Z86002 Personal history of in-situ neoplasm of other and unspecified genital organs: Secondary | ICD-10-CM | POA: Insufficient documentation

## 2024-01-18 NOTE — Patient Instructions (Signed)
 SURGICAL WAITING ROOM VISITATION  Patients having surgery or a procedure may have no more than 2 support people in the waiting area - these visitors may rotate.    Children under the age of 60 must have an adult with them who is not the patient.  Visitors with respiratory illnesses are discouraged from visiting and should remain at home.  If the patient needs to stay at the hospital during part of their recovery, the visitor guidelines for inpatient rooms apply. Pre-op nurse will coordinate an appropriate time for 1 support person to accompany patient in pre-op.  This support person may not rotate.    Please refer to the [facility] website for the visitor guidelines for Inpatients (after your surgery is over and you are in a regular room).       Your procedure is scheduled on:    Report to [facility] Main Entrance    Report to admitting at AM   Call this number if you have problems the morning of surgery [phone]   Do not eat food :After Midnight.   After Midnight you may have the following liquids until ______ AM/ PM DAY OF SURGERY  Water Non-Citrus Juices (without pulp, NO RED-Apple, White grape, White cranberry) Black Coffee (NO MILK/CREAM OR CREAMERS, sugar ok)  Clear Tea (NO MILK/CREAM OR CREAMERS, sugar ok) regular and decaf                             Plain Jell-O (NO RED)                                           Fruit ices (not with fruit pulp, NO RED)                                     Popsicles (NO RED)                                                               Sports drinks like Gatorade (NO RED)              Drink 2 Ensure/G2 drinks AT 10:00 PM the night before surgery.        The day of surgery:  Drink ONE (1) Pre-Surgery Clear Ensure or G2 at AM the morning of surgery. Drink in one sitting. Do not sip.  This drink was given to you during your hospital  pre-op appointment visit. Nothing else to drink after completing the  Pre-Surgery Clear  Ensure or G2.          If you have questions, please contact your surgeon's office.   FOLLOW BOWEL PREP AND ANY ADDITIONAL PRE OP INSTRUCTIONS YOU RECEIVED FROM YOUR SURGEON'S OFFICE!!!     Oral Hygiene is also important to reduce your risk of infection.                                    Remember - BRUSH YOUR TEETH THE MORNING  OF SURGERY WITH YOUR REGULAR TOOTHPASTE  DENTURES WILL BE REMOVED PRIOR TO SURGERY PLEASE DO NOT APPLY Poly grip OR ADHESIVES!!!   Do NOT smoke after Midnight   Stop all vitamins and herbal supplements 7 days before surgery.   Take these medicines the morning of surgery with A SIP OF WATER:   DO NOT TAKE ANY ORAL DIABETIC MEDICATIONS DAY OF YOUR SURGERY  Bring CPAP mask and tubing day of surgery.                              You may not have any metal on your body including hair pins, jewelry, and body piercing             Do not wear make-up, lotions, powders, perfumes/cologne, or deodorant  Do not wear nail polish including gel and S&S, artificial/acrylic nails, or any other type of covering on natural nails including finger and toenails. If you have artificial nails, gel coating, etc. that needs to be removed by a nail salon please have this removed prior to surgery or surgery may need to be canceled/ delayed if the surgeon/ anesthesia feels like they are unable to be safely monitored.   Do not shave  48 hours prior to surgery.               Men may shave face and neck.   Do not bring valuables to the hospital. [facility] IS NOT             RESPONSIBLE   FOR VALUABLES.   Contacts, glasses, dentures or bridgework may not be worn into surgery.   Bring small overnight bag day of surgery.   DO NOT BRING YOUR HOME MEDICATIONS TO THE HOSPITAL. PHARMACY WILL DISPENSE MEDICATIONS LISTED ON YOUR MEDICATION LIST TO YOU DURING YOUR ADMISSION IN THE HOSPITAL!    Patients discharged on the day of surgery will not be allowed to drive home.  Someone NEEDS to  stay with you for the first 24 hours after anesthesia.   Special Instructions: Bring a copy of your healthcare power of attorney and living will documents the day of surgery if you haven't scanned them before.              Please read over the following fact sheets you were given: IF YOU HAVE QUESTIONS ABOUT YOUR PRE-OP INSTRUCTIONS PLEASE CALL [phone].   If you received a COVID test during your pre-op visit  it is requested that you wear a mask when out in public, stay away from anyone that may not be feeling well and notify your surgeon if you develop symptoms. If you test positive for Covid or have been in contact with anyone that has tested positive in the last 10 days please notify you surgeon.    [place] - Preparing for Surgery Before surgery, you can play an important role.  Because skin is not sterile, your skin needs to be as free of germs as possible.  You can reduce the number of germs on your skin by washing with CHG (chlorahexidine gluconate) soap before surgery.  CHG is an antiseptic cleaner which kills germs and bonds with the skin to continue killing germs even after washing. Please DO NOT use if you have an allergy to CHG or antibacterial soaps.  If your skin becomes reddened/irritated stop using the CHG and inform your nurse when you arrive at Short Stay. Do not shave (including legs  and underarms) for at least 48 hours prior to the first CHG shower.  You may shave your face/neck.  Please follow these instructions carefully:  1.  Shower with CHG Soap the night before surgery and the  morning of surgery.  2.  If you choose to wash your hair, wash your hair first as usual with your normal  shampoo.  3.  After you shampoo, rinse your hair and body thoroughly to remove the shampoo.                             4.  Use CHG as you would any other liquid soap.  You can apply chg directly to the skin and wash.  Gently with a scrungie or clean washcloth.  5.  Apply the CHG Soap to  your body ONLY FROM THE NECK DOWN.   Do   not use on face/ open                           Wound or open sores. Avoid contact with eyes, ears mouth and   genitals (private parts).                       Wash face,  Genitals (private parts) with your normal soap.             6.  Wash thoroughly, paying special attention to the area where your    surgery  will be performed.  7.  Thoroughly rinse your body with warm water from the neck down.  8.  DO NOT shower/wash with your normal soap after using and rinsing off the CHG Soap.                9.  Pat yourself dry with a clean towel.            10.  Wear clean pajamas.            11.  Place clean sheets on your bed the night of your first shower and do not  sleep with pets. Day of Surgery : Do not apply any lotions/deodorants the morning of surgery.  Please wear clean clothes to the hospital/surgery center.  FAILURE TO FOLLOW THESE INSTRUCTIONS MAY RESULT IN THE CANCELLATION OF YOUR SURGERY  PATIENT SIGNATURE_________________________________  NURSE SIGNATURE__________________________________  ________________________________________________________________________

## 2024-01-18 NOTE — Patient Instructions (Signed)
 It was good to see you.  Please just call when you are ready to let us  know about surgery next week.  We will tentatively get you scheduled for a visit 6 months after your cold knife cone.

## 2024-01-18 NOTE — Progress Notes (Signed)
 Gynecologic Oncology Return Clinic Visit  01/18/24  Reason for Visit: treatment planning  Treatment History: Oncology History Overview Note  Dr. Rebbecca reviewed the pathology slides from 2018 and 2020.  He reports the 2018 case is consistent with adenocarcinoma with a less than 5% squamous component.  The 2020 case is involved with a comparable carcinoma, but direct comparison is difficult due to the limited available tissue on the 2020 case.   Lung cancer, left  upper lobe (HCC)  07/05/2016 Initial Diagnosis   Lung cancer, left  upper lobe (HCC)   02/04/2020 - 10/18/2020 Chemotherapy         01/19/2021 - 03/28/2021 Chemotherapy   Patient is on Treatment Plan : LUNG CARBOplatin  / Pemetrexed  / Pembrolizumab  q21d Induction x 4 cycles / Maintenance Pemetrexed  + Pembrolizumab      06/20/2021 - 06/21/2021 Chemotherapy   Patient is on Treatment Plan : NON-SMALL CELL LUNG RESEARCH SWOG S1900E Sotorasib  (AMGEN 510) q21d     Non-small cell lung cancer with metastasis (HCC)  12/16/2020 Initial Diagnosis   Non-small cell lung cancer with metastasis (HCC)   01/19/2021 - 03/28/2021 Chemotherapy   Patient is on Treatment Plan : LUNG CARBOplatin  / Pemetrexed  / Pembrolizumab  q21d Induction x 4 cycles / Maintenance Pemetrexed  + Pembrolizumab      06/20/2021 - 06/21/2021 Chemotherapy   Patient is on Treatment Plan : NON-SMALL CELL LUNG RESEARCH SWOG S1900E Sotorasib  (AMGEN 510) q21d      Below is the patient's cytology and dysplasia history: 11/2013 - NILM pap 12/2014 - NILM pap 04/2016 - NILM pap 07/2017 - NILM pap, HR HPV+ (negative 16/18/45) 11/2018 - NILM pap, HR HPV+ (negative 16/18/45) 12/2018 - ECC with fragments of benign transitional zone mucosa, no malignancy 12/2019 - NILM pap, HR HPV+ (negative 16/18/45) 12/2019 - ECC with low-grade dysplasia, benign endocervical epithelium 12/2020 - NILM pap, HR HPV+ (negative 16/18/45) 12/2021 - NILM pap, HR HPV+ (negative 16/18/45) 05/2022 -ECC with focal  scant fragments of ectocervix and squamous mucosa with low-grade dysplasia in the background of predominantly mucus and unremarkable endocervix 05/2023 - ASCUS pap, HR HPV+ (negative 16/18/45) 05/2023 - ECC with low-grade dysplasia 06/2023: LEEP, ECC Pathology revealed CIN-2-3 with low-grade dysplasia and HPV effect, glandular extension.  Dysplasia present circumferentially in the ectocervical and endocervical margins.  Deep stromal margin free.  ECC with detached fragments of high-grade dysplasia. 11/12/2023: Cold knife conization, ECC Pathology revealed CIN-2-3 along the anterior cone specimen with focal extension into the underlying endocervical glands involving 3-6:00, 6-9:00, and 9-12:00 quadrant.  No invasive carcinoma.  High-grade dysplasia involves endocervical margin from 3-6 o'clock and the ectocervical margin from 9-12:00.  The posterior cone specimen shows low-grade dysplasia.  ECC with predominantly blood and fibrin material, minute fragments of detached endocervical glandular cells.  Interval History: Doing well, no significant changes since her last visit with me.  Past Medical/Surgical History: Past Medical History:  Diagnosis Date   Anal cancer (HCC) 08/09/2012   Squamous cell   Anxiety    PANIC ATTACKS   Arthritis    Breast cancer (HCC) 2009   ER+PR+HER-2+   CHF (congestive heart failure) (HCC)    COPD (chronic obstructive pulmonary disease) (HCC)    Ductal carcinoma (HCC) 02/2008   invasive    Dyspnea    Heart palpitations    History of MRSA infection    History of radiation therapy 09/02/12-10/10/12   anal 50.4Gy   History of shingles 10/2014   HPV (human papilloma virus) anogenital infection    vulvar/  freezing hx   Hx of radiation therapy 2020   Lung nodule    Malignant neoplasm of upper lobe of left lung (HCC) 06/2016   Personal history of chemotherapy    Personal history of radiation therapy 2010   Pneumonia    NO RECENT PROBLEMS   PONV (postoperative nausea  and vomiting)    Hypotension   Radiation 10/21/08-12/08/08   Right breast 6240 cGy   Shingles 2017   Status post chemotherapy 02/2008   Taxol/Herceptin    Past Surgical History:  Procedure Laterality Date   BREAST BIOPSY Right 02/04/2008   malignant   BREAST LUMPECTOMY Right 2010   malignant   BREAST LUMPECTOMY WITH AXILLARY LYMPH NODE BIOPSY Right 5/10   Dr. Curvin   BRONCHIAL BIOPSY  12/28/2020   Procedure: BRONCHIAL BIOPSIES;  Surgeon: Brenna Adine CROME, DO;  Location: [facility];  Service: Pulmonary;;   BRONCHIAL BRUSHINGS  12/28/2020   Procedure: BRONCHIAL BRUSHINGS;  Surgeon: Brenna Adine CROME, DO;  Location: [facility];  Service: Pulmonary;;   BRONCHIAL NEEDLE ASPIRATION BIOPSY  12/28/2020   Procedure: BRONCHIAL NEEDLE ASPIRATION BIOPSIES;  Surgeon: Brenna Adine CROME, DO;  Location: [facility];  Service: Pulmonary;;   BRONCHIAL WASHINGS  12/28/2020   Procedure: BRONCHIAL WASHINGS;  Surgeon: Brenna Adine CROME, DO;  Location: [facility];  Service: Pulmonary;;   CERVICAL CONIZATION W/BX N/A 11/12/2023   Procedure: Conization;  Surgeon: Cleotilde Ronal RAMAN, MD;  Location: [facility] OR;  Service: Gynecology;  Laterality: N/A;   CESAREAN SECTION     COLONOSCOPY W/ POLYPECTOMY     ENDOBRONCHIAL ULTRASOUND  12/28/2020   Procedure: ENDOBRONCHIAL ULTRASOUND;  Surgeon: Brenna Adine CROME, DO;  Location: [facility];  Service: Pulmonary;;   EVALUATION UNDER ANESTHESIA WITH ANAL FISTULECTOMY N/A 08/09/2012   Procedure: EXAM UNDER ANESTHESIA AND BIOPSY OF ANAL MASS;  Surgeon: Krystal JINNY Russell, MD;  Location: [facility];  Service: General;  Laterality: N/A;   IR IMAGING GUIDED PORT INSERTION  01/12/2021   IR RADIOLOGIST EVAL & MGMT  03/11/2021   IR REMOVAL TUN ACCESS W/ PORT W/O FL MOD SED  02/24/2021   KNEE ARTHROSCOPY Left    left knee   LUNG LOBECTOMY Left    PORTACATH PLACEMENT  03/2008   dr. curvin    REMOVAL PORTACATH  2011   RIGHT/LEFT HEART CATH AND CORONARY ANGIOGRAPHY N/A 04/06/2023   Procedure:  RIGHT/LEFT HEART CATH AND CORONARY ANGIOGRAPHY;  Surgeon: Ladona Heinz, MD;  Location: [facility];  Service: Cardiovascular;  Laterality: N/A;   VIDEO ASSISTED THORACOSCOPY (VATS)/WEDGE RESECTION Left 07/05/2016   Procedure: VIDEO ASSISTED THORACOSCOPY (VATS)/WEDGE RESECTION left upper lobe,  lymph node dissection and placement of OnQ catheter;  Surgeon: Dallas KATHEE Jude, MD;  Location: [facility] OR;  Service: Thoracic;  Laterality: Left;   VIDEO BRONCHOSCOPY N/A 07/05/2016   Procedure: VIDEO BRONCHOSCOPY;  Surgeon: Dallas KATHEE Jude, MD;  Location: [facility] OR;  Service: Thoracic;  Laterality: N/A;   VIDEO BRONCHOSCOPY N/A 05/03/2018   Procedure: VIDEO BRONCHOSCOPY;  Surgeon: Jude Dallas KATHEE, MD;  Location: [facility] OR;  Service: Thoracic;  Laterality: N/A;   VIDEO BRONCHOSCOPY WITH ENDOBRONCHIAL NAVIGATION N/A 05/03/2018   Procedure: VIDEO BRONCHOSCOPY WITH ENDOBRONCHIAL NAVIGATION WITH BIOPSY;  Surgeon: Jude Dallas KATHEE, MD;  Location: [facility];  Service: Thoracic;  Laterality: N/A;   VIDEO BRONCHOSCOPY WITH ENDOBRONCHIAL NAVIGATION Bilateral 12/28/2020   Procedure: VIDEO BRONCHOSCOPY WITH ENDOBRONCHIAL NAVIGATION;  Surgeon: Brenna Adine CROME, DO;  Location: [facility];  Service: Pulmonary;  Laterality: Bilateral;  ION  VIDEO BRONCHOSCOPY WITH ENDOBRONCHIAL ULTRASOUND N/A 05/03/2018   Procedure: VIDEO BRONCHOSCOPY WITH ENDOBRONCHIAL ULTRASOUND;  Surgeon: Army Dallas NOVAK, MD;  Location: [facility] OR;  Service: Thoracic;  Laterality: N/A;   VIDEO BRONCHOSCOPY WITH RADIAL ENDOBRONCHIAL ULTRASOUND  12/28/2020   Procedure: RADIAL ENDOBRONCHIAL ULTRASOUND;  Surgeon: Brenna Adine CROME, DO;  Location: [facility];  Service: Pulmonary;;    Family History  Problem Relation Age of Onset   Lung cancer Father    Stroke Mother    Breast cancer Neg Hx     Social History   Socioeconomic History   Marital status: Married    Spouse name: Not on file   Number of children: Not on file   Years of education: Not on file    Highest education level: Not on file  Occupational History    Employer: [employer], [place], [place]    Comment: Realtor  Tobacco Use   Smoking status: Former    Current packs/day: 0.00    Types: Cigarettes    Start date: 2007    Quit date: 2015    Years since quitting: 10.7   Smokeless tobacco: Never   Tobacco comments:    stopped smoking cigarettes Jan. 2018  Vaping Use   Vaping status: Never Used  Substance and Sexual Activity   Alcohol use: Yes    Alcohol/week: 14.0 standard drinks of alcohol    Types: 14 Standard drinks or equivalent per week    Comment: 0-2 drinks of day   Drug use: No   Sexual activity: Yes    Partners: Male    Birth control/protection: Other-see comments, Post-menopausal    Comment: vasectomy  Other Topics Concern   Not on file  Social History Narrative   Married   Lost a son age 71 this year and husband's brother, still going through grieving process.   No family history of breast or ovarian cancer.   Employed as Veterinary surgeon   Social Drivers of Corporate investment banker Strain: Not on file  Food Insecurity: No Food Insecurity (12/25/2023)   Hunger Vital Sign    Worried About Running Out of Food in the Last Year: Never true    Ran Out of Food in the Last Year: Never true  Transportation Needs: No Transportation Needs (12/25/2023)   PRAPARE - Administrator, Civil Service (Medical): No    Lack of Transportation (Non-Medical): No  Physical Activity: Not on file  Stress: Not on file  Social Connections: Not on file    Current Medications:  Current Outpatient Medications:    albuterol  (VENTOLIN  HFA) 108 (90 Base) MCG/ACT inhaler, INHALE 2 PUFFS BY MOUTH EVERY 6 HOURS AS NEEDED FOR WHEEZING OR SHORTNESS OF BREATH, Disp: 8.5 g, Rfl: 1   budesonide -glycopyrrolate -formoterol  (BREZTRI  AEROSPHERE) 160-9-4.8 MCG/ACT AERO inhaler, Inhale 2 puffs into the lungs in the morning and at bedtime., Disp: 10.7 g, Rfl: 6   estradiol  (ESTRACE )  0.1 MG/GM vaginal cream, 1 gram vaginally twice weekly (Patient not taking: Reported on 01/17/2024), Disp: 42.5 g, Rfl: 1   furosemide  (LASIX ) 20 MG tablet, Take 20 mg by mouth daily as needed for fluid or edema., Disp: , Rfl:    loratadine  (CLARITIN ) 10 MG tablet, Take 10 mg by mouth daily as needed (seasonal allergies)., Disp: , Rfl:    Multiple Vitamins-Minerals (MULTIVITAMIN WITH MINERALS) tablet, Take 3 tablets by mouth daily before breakfast. Woman 50+, Disp: , Rfl:    acetaminophen  (TYLENOL ) 500 MG tablet, Take 500-1,000 mg by  mouth every 6 (six) hours as needed (pain.)., Disp: , Rfl:    Omega-3 Fatty Acids (FISH OIL TRIPLE STRENGTH PO), Take 1 capsule by mouth in the morning., Disp: , Rfl:   Review of Systems: Denies appetite changes, fevers, chills, fatigue, unexplained weight changes. Denies hearing loss, neck lumps or masses, mouth sores, ringing in ears or voice changes. Denies cough or wheezing.  Denies shortness of breath. Denies chest pain or palpitations. Denies leg swelling. Denies abdominal distention, pain, blood in stools, constipation, diarrhea, nausea, vomiting, or early satiety. Denies pain with intercourse, dysuria, frequency, hematuria or incontinence. Denies hot flashes, pelvic pain, vaginal bleeding or vaginal discharge.   Denies joint pain, back pain or muscle pain/cramps. Denies itching, rash, or wounds. Denies dizziness, headaches, numbness or seizures. Denies swollen lymph nodes or glands, denies easy bruising or bleeding. Denies anxiety, depression, confusion, or decreased concentration.  Physical Exam: BP 113/76 (BP Location: Left Arm, Patient Position: Sitting)   Pulse 91   Temp 98.7 F (37.1 C) (Oral)   Resp 19   Wt 150 lb 9.6 oz (68.3 kg)   LMP 04/07/2008   SpO2 93%   BMI 25.06 kg/m  General: Alert, oriented, no acute distress. GU: Normal appearing external genitalia without erythema, excoriation, or lesions.  Speculum exam reveals continuing  healing of cervical bed although not completely healed.  Cervix itself is quite atrophic related to prior radiation.  Laboratory & Radiologic Studies: None new  Assessment & Plan: Gina Cervantes is a 63 y.o. woman with HPV-related cervical dysplasia in the setting of a history of HPV-related anal cancer.  Patient continues to heal well although has not completely healed after her cold knife cone.  Spent some time discussing treatment options again including moving forward with small reexcision (based on her exam today, I do think that there is enough cervix to do a small repeat excision) versus close surveillance with HPV testing, ECC, and possible colposcopy at 6 months.  We discussed the risks and benefits of both approaches and ultimately, the patient thinks that she would like to proceed with close surveillance.  She would like to speak with her husband and will call my clinic to let me know.  She is tentatively scheduled for cold knife cone next week.  If she ultimately decides to move forward with surveillance, I will see her in early January for repeat exam and testing.  28 minutes of total time was spent for this patient encounter, including preparation, face-to-face counseling with the patient and coordination of care, and documentation of the encounter.  Comer Dollar, MD  Division of Gynecologic Oncology  Department of Obstetrics and Gynecology  [facility]

## 2024-01-18 NOTE — Progress Notes (Addendum)
 COVID Vaccine Completed: yes  Date of COVID positive in last 90 days:  PCP - Elsie Lesches, MD Cardiologist - Gordy Bergamo, MD  Pulmonary- Dr. Brenna  Chest x-ray - CT- 09/19/23 Epic EKG - 07/23/23 Epic- tachy at 117 Stress Test - N/A ECHO - 07/23/23 Epic Cardiac Cath - 04/06/23 Epic Pacemaker/ICD device last checked:N/A Spinal Cord Stimulator:N/A  Bowel Prep - N/A  Sleep Study - N/A CPAP -   Fasting Blood Sugar - N/A Checks Blood Sugar _____ times a day  Last dose of GLP1 agonist-  N/A GLP1 instructions:  Do not take after     Last dose of SGLT-2 inhibitors-  N/A SGLT-2 instructions:  Do not take after     Blood Thinner Instructions: N/A Last dose:   Time: Aspirin  Instructions:N/A Last Dose:  Activity level:  Can go up a flight of stairs and perform activities of daily living without stopping and without symptoms of chest pain or shortness of breath.  Able to exercise without symptoms  Unable to go up a flight of stairs without symptoms of     Anesthesia review: CHF, COPD, lung cancer, breat cancer, anal cancerheart palpitations, sinus tachy   Patient denies shortness of breath, fever, cough and chest pain at PAT appointment  Patient verbalized understanding of instructions that were given to them at the PAT appointment. Patient was also instructed that they will need to review over the PAT instructions again at home before surgery.

## 2024-01-21 ENCOUNTER — Encounter (HOSPITAL_COMMUNITY)
Admission: RE | Admit: 2024-01-21 | Discharge: 2024-01-21 | Disposition: A | Discharge status: Home / Self Care | Source: Ambulatory Visit | Attending: Anesthesiology | Admitting: Anesthesiology

## 2024-01-21 ENCOUNTER — Encounter (HOSPITAL_COMMUNITY): Payer: Self-pay

## 2024-01-21 DIAGNOSIS — I5043 Acute on chronic combined systolic (congestive) and diastolic (congestive) heart failure: Secondary | ICD-10-CM

## 2024-01-21 NOTE — Progress Notes (Signed)
 Called patient regarding her PST appointment at 1100. She stated her appointment and surgery are supposed to be cancelled.

## 2024-01-23 ENCOUNTER — Ambulatory Visit (HOSPITAL_COMMUNITY): Admission: RE | Admit: 2024-01-23 | Source: Home / Self Care | Admitting: Gynecologic Oncology

## 2024-01-23 ENCOUNTER — Encounter (HOSPITAL_COMMUNITY): Admission: RE | Payer: Self-pay | Source: Home / Self Care

## 2024-01-23 DIAGNOSIS — D069 Carcinoma in situ of cervix, unspecified: Secondary | ICD-10-CM

## 2024-01-23 SURGERY — CONE BIOPSY, CERVIX
Anesthesia: General

## 2024-01-31 ENCOUNTER — Telehealth: Admitting: Gynecologic Oncology

## 2024-02-05 ENCOUNTER — Other Ambulatory Visit (HOSPITAL_BASED_OUTPATIENT_CLINIC_OR_DEPARTMENT_OTHER): Payer: Self-pay

## 2024-02-05 MED ORDER — COMIRNATY 30 MCG/0.3ML IM SUSY
0.3000 mL | PREFILLED_SYRINGE | Freq: Once | INTRAMUSCULAR | 0 refills | Status: AC
  Filled 2024-02-05 – 2024-02-20 (×2): qty 0.3, 1d supply, fill #0

## 2024-02-15 DIAGNOSIS — I5082 Biventricular heart failure: Secondary | ICD-10-CM | POA: Diagnosis not present

## 2024-02-20 ENCOUNTER — Other Ambulatory Visit (HOSPITAL_BASED_OUTPATIENT_CLINIC_OR_DEPARTMENT_OTHER): Payer: Self-pay

## 2024-02-20 MED ORDER — FLUZONE 0.5 ML IM SUSY
0.5000 mL | PREFILLED_SYRINGE | Freq: Once | INTRAMUSCULAR | 0 refills | Status: AC
  Filled 2024-02-20: qty 0.5, 1d supply, fill #0

## 2024-02-21 ENCOUNTER — Other Ambulatory Visit (HOSPITAL_BASED_OUTPATIENT_CLINIC_OR_DEPARTMENT_OTHER): Payer: Self-pay

## 2024-02-21 ENCOUNTER — Encounter: Admitting: Gynecologic Oncology

## 2024-02-22 ENCOUNTER — Inpatient Hospital Stay: Attending: Gynecologic Oncology

## 2024-02-22 ENCOUNTER — Other Ambulatory Visit (HOSPITAL_BASED_OUTPATIENT_CLINIC_OR_DEPARTMENT_OTHER): Payer: Self-pay

## 2024-02-22 ENCOUNTER — Inpatient Hospital Stay

## 2024-02-22 DIAGNOSIS — C50411 Malignant neoplasm of upper-outer quadrant of right female breast: Secondary | ICD-10-CM | POA: Diagnosis not present

## 2024-02-22 DIAGNOSIS — C3490 Malignant neoplasm of unspecified part of unspecified bronchus or lung: Secondary | ICD-10-CM

## 2024-02-22 DIAGNOSIS — N72 Inflammatory disease of cervix uteri: Secondary | ICD-10-CM | POA: Insufficient documentation

## 2024-02-22 DIAGNOSIS — B977 Papillomavirus as the cause of diseases classified elsewhere: Secondary | ICD-10-CM | POA: Insufficient documentation

## 2024-02-22 DIAGNOSIS — Z170 Estrogen receptor positive status [ER+]: Secondary | ICD-10-CM | POA: Diagnosis not present

## 2024-02-22 DIAGNOSIS — C210 Malignant neoplasm of anus, unspecified: Secondary | ICD-10-CM | POA: Diagnosis not present

## 2024-02-22 DIAGNOSIS — C3410 Malignant neoplasm of upper lobe, unspecified bronchus or lung: Secondary | ICD-10-CM | POA: Insufficient documentation

## 2024-02-22 NOTE — Progress Notes (Signed)
 REFERRING PROVIDER: Arloa Gina [name], MD [phone] [address] [place],  [place] [address]  PRIMARY PROVIDER:  Arloa Gina [name], MD  PRIMARY REASON FOR VISIT:  1. Malignant neoplasm of upper-outer quadrant of right breast in female, estrogen receptor positive (HCC)   2. Anal cancer (HCC)   3. Malignant neoplasm of upper lobe of lung, unspecified laterality (HCC)   4. High risk human papilloma virus (HPV) infection of cervix   5. Non-small cell lung cancer with metastasis (HCC)    HISTORY OF PRESENT ILLNESS:   Gina Cervantes, a 63 y.o. female, was seen for a [facility] cancer genetics consultation at the request of Gina Arloa, MD due to a personal history of personal history of premenopausal breast cancer. Gina Cervantes presents to clinic today to discuss the possibility of a hereditary predisposition to cancer, genetic testing, and to further clarify her future cancer risks, as well as potential cancer risks for family members.   Diagnosis: In January of 2009, at the age of 36, Gina Cervantes was diagnosed with breast cancer of the right breast ER/PR positive, HER2 positive. She was diagnosed with anal cancer in April 2014 at the age of 87 and in February 2018 she was diagnosed with lung cancer at 50. SABRA   CANCER HISTORY:  Oncology History Overview Note  Dr. Rebbecca reviewed the pathology slides from 2018 and 2020.  He reports the 2018 case is consistent with adenocarcinoma with a less than 5% squamous component.  The 2020 case is involved with a comparable carcinoma, but direct comparison is difficult due to the limited available tissue on the 2020 case.   Breast cancer, right (HCC)  06/04/2007 Initial Diagnosis   Breast cancer, right (HCC)   Anal cancer (HCC)  08/15/2012 Initial Diagnosis   Anal cancer (HCC)   Lung cancer, left  upper lobe (HCC)  07/05/2016 Initial Diagnosis   Lung cancer, left  upper lobe (HCC)   02/04/2020 - 10/18/2020 Chemotherapy         01/19/2021 -  03/28/2021 Chemotherapy   Patient is on Treatment Plan : LUNG CARBOplatin  / Pemetrexed  / Pembrolizumab  q21d Induction x 4 cycles / Maintenance Pemetrexed  + Pembrolizumab      06/20/2021 - 06/21/2021 Chemotherapy   Patient is on Treatment Plan : NON-SMALL CELL LUNG RESEARCH SWOG S1900E Sotorasib  (AMGEN 510) q21d     Non-small cell lung cancer with metastasis (HCC)  12/16/2020 Initial Diagnosis   Non-small cell lung cancer with metastasis (HCC)   01/19/2021 - 03/28/2021 Chemotherapy   Patient is on Treatment Plan : LUNG CARBOplatin  / Pemetrexed  / Pembrolizumab  q21d Induction x 4 cycles / Maintenance Pemetrexed  + Pembrolizumab      06/20/2021 - 06/21/2021 Chemotherapy   Patient is on Treatment Plan : NON-SMALL CELL LUNG RESEARCH SWOG S1900E Sotorasib  (AMGEN 510) q21d      RISK FACTORS:  Menarche was at age 39.  First live birth at age 46.  OCP use for approximately 25 years.  Ovaries intact: yes.  Hysterectomy: no.  Menopausal status: postmenopausal.  HRT use: 0 years. Colonoscopy: yes; normal. Mammogram within the last year: yes. Number of breast biopsies: 1. Up to date with pelvic exams: yes. Any excessive exposures in the past: Living in [facility] Tobacco Use: Former (Quit: Dec 2009 at time of her breast cancer diagnosis)  Past Medical History:  Diagnosis Date   Anal cancer (HCC) 08/09/2012   Squamous cell   Anxiety    PANIC ATTACKS   Arthritis    Breast  cancer [facility]) 2009   ER+PR+HER-2+   CHF (congestive heart failure) (HCC)    COPD (chronic obstructive pulmonary disease) (HCC)    Ductal carcinoma (HCC) 02/2008   invasive    Dyspnea    Heart palpitations    History of MRSA infection    History of radiation therapy 09/02/12-10/10/12   anal 50.4Gy   History of shingles 10/2014   HPV (human papilloma virus) anogenital infection    vulvar/ freezing hx   Hx of radiation therapy 2020   Lung nodule    Malignant neoplasm of upper lobe of left lung (HCC) 06/2016   Personal  history of chemotherapy    Personal history of radiation therapy 2010   Pneumonia    NO RECENT PROBLEMS   PONV (postoperative nausea and vomiting)    Hypotension   Radiation 10/21/08-12/08/08   Right breast 6240 cGy   Shingles 2017   Status post chemotherapy 02/2008   Taxol/Herceptin   Past Surgical History:  Procedure Laterality Date   BREAST BIOPSY Right 02/04/2008   malignant   BREAST LUMPECTOMY Right 2010   malignant   BREAST LUMPECTOMY WITH AXILLARY LYMPH NODE BIOPSY Right 5/10   Dr. Curvin   BRONCHIAL BIOPSY  12/28/2020   Procedure: BRONCHIAL BIOPSIES;  Surgeon: Brenna Adine CROME, DO;  Location: [facility];  Cervantes: Pulmonary;;   BRONCHIAL BRUSHINGS  12/28/2020   Procedure: BRONCHIAL BRUSHINGS;  Surgeon: Brenna Adine CROME, DO;  Location: [facility];  Cervantes: Pulmonary;;   BRONCHIAL NEEDLE ASPIRATION BIOPSY  12/28/2020   Procedure: BRONCHIAL NEEDLE ASPIRATION BIOPSIES;  Surgeon: Brenna Adine CROME, DO;  Location: [facility];  Cervantes: Pulmonary;;   BRONCHIAL WASHINGS  12/28/2020   Procedure: BRONCHIAL WASHINGS;  Surgeon: Brenna Adine CROME, DO;  Location: [facility];  Cervantes: Pulmonary;;   CERVICAL CONIZATION W/BX N/A 11/12/2023   Procedure: Conization;  Surgeon: Cleotilde Ronal RAMAN, MD;  Location[facility];  Cervantes: Gynecology;  Laterality: N/A;   CESAREAN SECTION     COLONOSCOPY W/ POLYPECTOMY     ENDOBRONCHIAL ULTRASOUND  12/28/2020   Procedure: ENDOBRONCHIAL ULTRASOUND;  Surgeon: Brenna Adine CROME, DO;  Location: [facility];  Cervantes: Pulmonary;;   EVALUATION UNDER ANESTHESIA WITH ANAL FISTULECTOMY N/A 08/09/2012   Procedure: EXAM UNDER ANESTHESIA AND BIOPSY OF ANAL MASS;  Surgeon: Krystal JINNY Russell, MD;  Location: [facility];  Cervantes: General;  Laterality: N/A;   IR IMAGING GUIDED PORT INSERTION  01/12/2021   IR RADIOLOGIST EVAL & MGMT  03/11/2021   IR REMOVAL TUN ACCESS W/ PORT W/O FL MOD SED  02/24/2021   KNEE ARTHROSCOPY Left    left knee   LUNG LOBECTOMY Left    PORTACATH PLACEMENT   03/2008   dr. curvin    REMOVAL PORTACATH  2011   RIGHT/LEFT HEART CATH AND CORONARY ANGIOGRAPHY N/A 04/06/2023   Procedure: RIGHT/LEFT HEART CATH AND CORONARY ANGIOGRAPHY;  Surgeon: Ladona Heinz, MD;  [facility]: [facility];  Cervantes: Cardiovascular;  Laterality: N/A;   VIDEO ASSISTED THORACOSCOPY (VATS)/WEDGE RESECTION Left 07/05/2016   Procedure: VIDEO ASSISTED THORACOSCOPY (VATS)/WEDGE RESECTION left upper lobe,  lymph node dissection and placement of OnQ catheter;  Surgeon: Dallas KATHEE Jude, MD;  Location: [facility] OR;  Cervantes: Thoracic;  Laterality: Left;   VIDEO BRONCHOSCOPY N/A 07/05/2016   Procedure: VIDEO BRONCHOSCOPY;  Surgeon: Dallas KATHEE Jude, MD;  Location: [facility] OR;  Cervantes: Thoracic;  Laterality: N/A;   VIDEO BRONCHOSCOPY N/A 05/03/2018   Procedure: VIDEO BRONCHOSCOPY;  Surgeon: Jude Dallas KATHEE, MD;  Location: [facility]  OR;  Cervantes: Thoracic;  Laterality: N/A;   VIDEO BRONCHOSCOPY WITH ENDOBRONCHIAL NAVIGATION N/A 05/03/2018   Procedure: VIDEO BRONCHOSCOPY WITH ENDOBRONCHIAL NAVIGATION WITH BIOPSY;  Surgeon: Army Dallas NOVAK, MD;  Location: [facility] OR;  Cervantes: Thoracic;  Laterality: N/A;   VIDEO BRONCHOSCOPY WITH ENDOBRONCHIAL NAVIGATION Bilateral 12/28/2020   Procedure: VIDEO BRONCHOSCOPY WITH ENDOBRONCHIAL NAVIGATION;  Surgeon: Brenna Adine CROME, DO;  Location: [facility];  Cervantes: Pulmonary;  Laterality: Bilateral;  ION   VIDEO BRONCHOSCOPY WITH ENDOBRONCHIAL ULTRASOUND N/A 05/03/2018   Procedure: VIDEO BRONCHOSCOPY WITH ENDOBRONCHIAL ULTRASOUND;  Surgeon: Army Dallas NOVAK, MD;  Location: [facility] OR;  Cervantes: Thoracic;  Laterality: N/A;   VIDEO BRONCHOSCOPY WITH RADIAL ENDOBRONCHIAL ULTRASOUND  12/28/2020   Procedure: RADIAL ENDOBRONCHIAL ULTRASOUND;  Surgeon: Brenna Adine CROME, DO;  Location: [facility];  Cervantes: Pulmonary;;   Social History   Socioeconomic History   Marital status: Married    Spouse name: Not on file   Number of children: Not on file   Years of education: Not  on file   Highest education level: Not on file  Occupational History    Employer: [employer] [place], [place]    Comment: Realtor  Tobacco Use   Smoking status: Former    Current packs/day: 0.00    Types: Cigarettes    Start date: 2007    Quit date: 2015    Years since quitting: 10.8   Smokeless tobacco: Never   Tobacco comments:    stopped smoking cigarettes Jan. 2018  Vaping Use   Vaping status: Never Used  Substance and Sexual Activity   Alcohol use: Yes    Alcohol/week: 14.0 standard drinks of alcohol    Types: 14 Standard drinks or equivalent per week    Comment: 0-2 drinks of day   Drug use: No   Sexual activity: Yes    Partners: Male    Birth control/protection: Other-see comments, Post-menopausal    Comment: vasectomy  Other Topics Concern   Not on file  Social History Narrative   Married   Lost a son age 1 this year and husband's brother, still going through grieving process.   No family history of breast or ovarian cancer.   Employed as Veterinary surgeon   Social Drivers of Corporate investment banker Strain: Not on file  Food Insecurity: No Food Insecurity (12/25/2023)   Cervantes Vital Sign    Worried About Running Out of Food in the Last Year: Never true    Ran Out of Food in the Last Year: Never true  Transportation Needs: No Transportation Needs (12/25/2023)   PRAPARE - Administrator, Civil Cervantes (Medical): No    Lack of Transportation (Non-Medical): No  Physical Activity: Not on file  Stress: Not on file  Social Connections: Not on file     FAMILY HISTORY:  We obtained a detailed, 4-generation family history pasted below.   Burlene Montecalvo is unaware of relatives completing genetic testing for hereditary cancer risks.  There is no reported Ashkenazi Jewish ancestry.  There is no known consanguinity. Significant diagnoses are listed below: Family History  Problem Relation Age of Onset   Lung cancer Father    Stroke Mother    Breast  cancer Neg Hx    GENETIC COUNSELING ASSESSMENT: Gina Cervantes is a 63 y.o. female with a personal history of breast at 62, anal and lung cancer which is somewhat suggestive of a hereditary cancer predisposition syndrome. We, therefore, discussed and recommended the following at today's  visit.   DISCUSSION: We discussed that, in general, most cancer is not inherited in families, but instead is sporadic or familial. Sporadic cancers occur by chance and typically happen at older ages (>50 years) as this type of cancer is caused by genetic changes acquired during an individual's lifetime. Some families have more cancers than would be expected by chance; however, the ages or types of cancer are not consistent with a known genetic mutation or known genetic mutations have been ruled out. This type of familial cancer is thought to be due to a combination of multiple genetic, environmental, hormonal, and lifestyle factors. While this combination of factors likely increases the risk of cancer, the exact source of this risk is not currently identifiable or testable.  We discussed that 5-10% of cancer is the result of germline (heritable) genetic variants, with most cases associated with BRCA1/BRCA2. There are other genes that can be associated with hereditary cancer syndromes. We discussed that testing is beneficial for several reasons including knowing how to follow individuals after completing their treatment, identifying whether potential treatment options such as PARP inhibitors would be beneficial, and understanding if other family members could be at risk for cancer and allow them to undergo genetic testing.   We reviewed the characteristics, features and inheritance patterns of hereditary cancer syndromes. We also discussed genetic testing, including the appropriate family members to test, the process of testing, insurance coverage and turn-around-time for results. We discussed the implications of a negative,  positive, carrier and/or variant of uncertain significant result. Gina Cervantes  was offered a common hereditary cancer panel (40 genes) and an expanded pan-cancer panel (77 genes). Gina Cervantes was informed of the benefits and limitations of each panel, including that expanded pan-cancer panels contain genes that do not have clear management guidelines at this point in time.  We also discussed that as the number of genes included on a panel increases, the chances of variants of uncertain significance increases.  GENETIC TESTING NATIONAL CRITERIA: Based on [facility]'s personal history of breast cancer diagnosed at 62 she meets medical criteria for genetic testing based on the [employer] (NCCN) guidelines. Despite that she meets criteria, she may still have an out of pocket cost. We discussed that if her out of pocket cost for testing is over $100, the laboratory will call and confirm whether she wants to proceed with testing.  If the out of pocket cost of testing is less than $100 she will be billed by the genetic testing laboratory.   GENETIC TESTING CONSENT:  After considering the risks, benefits, and limitations, Gina Cervantes wished to have a further discussion with her husband to think about if she would like to pursue genetic testing.  She plans to follow-up if she would like testing and a blood draw scheduled. She was very thoughtful and asked many questions to help gather information to inform whether she felt that the results from genetic testing would be useful to her. She expressed that she is very motivated to do preventative screening/management.   We discussed the logistic of genetic testing and that blood sample would be collected and sent to [facility] for analysis  We reviewed the CancerNext and CancerNext expanded panels and she expressed interest in the CancerNext Expanded panel if she opts to pursue genetic testing.  We talked about the return of  result which typically take approximately 2-3 weeks' time  GENETIC INFORMATION NONDISCRIMINATION ACT (GINA): We discussed that some people do not want to  undergo genetic testing due to fear of genetic discrimination.  The Genetic Information Nondiscrimination Act (GINA) was signed into federal law in 2008. GINA prohibits health insurers and most employers from discriminating against individuals based on genetic information (including the results of genetic tests and family history information). According to GINA, health insurance companies cannot consider genetic information to be a preexisting condition, nor can they use it to make decisions regarding coverage or rates. GINA also makes it illegal for most employers to use genetic information in making decisions about hiring, firing, promotion, or terms of employment. It is important to note that GINA does not offer protections for life insurance, disability insurance, or long-term care insurance. GINA does not apply to those in the [employer], those who work for companies with less than 15 employees, and new life insurance or long-term disability insurance policies.  Health status due to a cancer diagnosis is not protected under GINA. More information about GINA can be found by visiting [employer].  Lastly, we encouraged Gina Cervantes to remain in contact with cancer genetics annually so that we can continuously update the family history and inform her of any changes in cancer genetics and testing that may be of benefit for this family.   Gina Cervantes's questions were answered to her satisfaction today. Our contact information was provided should additional questions or concerns arise. Thank you for the referral and allowing us  to share in the care of your patient.   Resources:  Gina Cervantes was provided with the following:  [employer] Hereditary Cancer Testing Patient Guide PLAN:  Testing Ordered:  No Testing ordered today Clinic Note Faxed/Routed to [facility] PCP Cervantes Gina [name], MD   Santana Fryer, MS, [facility]  Certified Genetic Counselor  Email: [email or web address]  Phone: [phone]  I personally spent a total of 60 minutes in the care of the patient today including preparing to see the patient, counseling and educating, referring and communicating with other health care professionals, and documenting clinical information in the EHR.  The patient was seen alone. Drs. Lanny Stalls, and/or Gudena were available for questions, if needed. _______________________________________________________________________ For Office Staff:  Number of people involved in session: 1 Was an Intern/ student involved with case: no

## 2024-02-28 DIAGNOSIS — J0110 Acute frontal sinusitis, unspecified: Secondary | ICD-10-CM | POA: Diagnosis not present

## 2024-02-28 DIAGNOSIS — R0902 Hypoxemia: Secondary | ICD-10-CM | POA: Diagnosis not present

## 2024-02-29 ENCOUNTER — Other Ambulatory Visit: Payer: Self-pay | Admitting: Medical Genetics

## 2024-02-29 DIAGNOSIS — Z006 Encounter for examination for normal comparison and control in clinical research program: Secondary | ICD-10-CM

## 2024-03-15 ENCOUNTER — Other Ambulatory Visit: Payer: Self-pay | Admitting: Emergency Medicine

## 2024-03-17 DIAGNOSIS — I5082 Biventricular heart failure: Secondary | ICD-10-CM | POA: Diagnosis not present

## 2024-03-19 ENCOUNTER — Ambulatory Visit
Admission: RE | Admit: 2024-03-19 | Discharge: 2024-03-19 | Disposition: A | Discharge status: Home / Self Care | Source: Ambulatory Visit | Attending: Emergency Medicine | Admitting: Emergency Medicine

## 2024-03-19 DIAGNOSIS — C3492 Malignant neoplasm of unspecified part of left bronchus or lung: Secondary | ICD-10-CM | POA: Diagnosis not present

## 2024-03-19 DIAGNOSIS — J479 Bronchiectasis, uncomplicated: Secondary | ICD-10-CM | POA: Diagnosis not present

## 2024-03-19 DIAGNOSIS — J984 Other disorders of lung: Secondary | ICD-10-CM | POA: Diagnosis not present

## 2024-03-19 DIAGNOSIS — R918 Other nonspecific abnormal finding of lung field: Secondary | ICD-10-CM

## 2024-03-25 ENCOUNTER — Telehealth: Payer: Self-pay | Admitting: *Deleted

## 2024-03-25 NOTE — Telephone Encounter (Signed)
 LVM for patient to call office regarding f/u appointment. Dr. Cloretta asking if she would agree to f/u n 1-2 months. Requested she call office.

## 2024-04-10 ENCOUNTER — Telehealth: Payer: Self-pay | Admitting: *Deleted

## 2024-04-10 NOTE — Telephone Encounter (Signed)
 Copied from CRM #[id number]. Topic: Clinical - Lab/Test Results >> Apr 08, 2024 11:00 AM Benton O wrote: Reason for CRM: patient is calling about her ct scan results that she had on the 12th . Please give patient a call concerning results  [phone]  I called and spoke with the pt  I scheduled ov with RB for 04/16/24- virtual per her request to review results

## 2024-04-16 ENCOUNTER — Encounter: Payer: Self-pay | Admitting: Emergency Medicine

## 2024-04-16 ENCOUNTER — Telehealth: Admitting: Emergency Medicine

## 2024-04-16 DIAGNOSIS — R918 Other nonspecific abnormal finding of lung field: Secondary | ICD-10-CM | POA: Diagnosis not present

## 2024-04-16 NOTE — Progress Notes (Signed)
 Virtual Visit via Video Note  I connected with Gina Cervantes on 04/16/24 at  1:30 PM EST by a video enabled telemedicine application and verified that I am speaking with the correct person using two identifiers.  Location: Patient: Home Provider: Office   I discussed the limitations of evaluation and management by telemedicine and the availability of in person appointments. The patient expressed understanding and agreed to proceed.  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 Cloretta.  She has ground glass pulmonary nodules and there have been some consideration of possible PEF with Dr. Brenna although this was not improved by her insurance and so it was deferred.  We have been following serial imaging   Observations/Objective: CT scan of the chest 03/19/2024 reviewed by me shows multiple stable mediastinal lymph nodes, bilateral axillary and subpectoral nodes of indeterminate significance, stable.  Multiple solid and subsolid ground glass lesions bilaterally that have gradually increased dating back to 2018, grossly stable compared with 09/2023.  Her 2.4 cm left upper lobe mixed density nodule has increased slightly in size with an increased solid component now 10 mm.  She reports that she had been well, but developed URI sx about 1 week ago - sick for last 3 days. Hasn't checked for COVID yet.  COVID and flu shots are up to date.  She would like to avoid XRT if possible given her hx associated scar.   Assessment and Plan: -Discussed options including bronchoscopy of diagnosis, possibly also PAF.  She needs to get in touch with her insurance to see if it would be covered.  She wants to avoid XRT or chemo, systemic therapy if at all possible due to the associated side effects.  - She has a viral syndrome, is going to check  for COVID.  If she does have it then we should offer her antivirals.  She also knows to let us  know if this progresses into an acute exacerbation at which time we would treat her with antibiotics, prednisone .  Follow Up Instructions: -1 month to discuss whether bronchoscopy and PEF should be scheduled   I discussed the assessment and treatment plan with the patient. The patient was provided an opportunity to ask questions and all were answered. The patient agreed with the plan and demonstrated an understanding of the instructions.   The patient was advised to call back or seek an in-person evaluation if the symptoms worsen or if the condition fails to improve as anticipated.  I provided 25 minutes of non-face-to-face time during this encounter.   Lamar GORMAN Chris, MD

## 2024-04-17 ENCOUNTER — Ambulatory Visit: Payer: Self-pay | Admitting: Emergency Medicine

## 2024-04-17 MED ORDER — DOXYCYCLINE HYCLATE 100 MG PO TABS: 100.0000 mg | ORAL_TABLET | Freq: Two times a day (BID) | ORAL | 0 refills | Status: AC

## 2024-04-17 MED ORDER — PREDNISONE 10 MG PO TABS: ORAL_TABLET | ORAL | 0 refills | Status: AC

## 2024-04-17 NOTE — Telephone Encounter (Signed)
 I addressed the MyChart message.  Sent doxycycline  and prednisone 

## 2024-04-17 NOTE — Telephone Encounter (Signed)
 FYI Only or Action Required?: Action required by provider: clinical question for provider and update on patient condition.  Patient is followed in Pulmonology for copd, last seen on 04/16/2024 by Shelah Lamar RAMAN, MD.  Called Nurse Triage reporting Cough.  Symptoms began yesterday. worsened  Interventions attempted: Rescue inhaler, Maintenance inhaler, and Home oxygen  use.  Symptoms are: gradually worsening.  Triage Disposition: See Physician Within 24 Hours  Patient/caregiver understands and will follow disposition?: No, wishes to speak with PCP  Copied from CRM #[id number]. Topic: Clinical - Red Word Triage >> Apr 17, 2024  1:32 PM Rilla B wrote: [facility] that prompted transfer to Nurse Triage: Cough, moved to chest. Calling to see if Dr Shelah will send in meds. Reason for Disposition  [1] Continuous (nonstop) coughing interferes with work or school AND [2] no improvement using cough treatment per Care Advice  Answer Assessment - Initial Assessment Questions Pt had virtual with Dr. Shelah yesterday about 130 and by 430 she felt like it had progress. Pt is negative for flu and covid. Symptoms are worse feels like it is more in her chest now. Cough is productive currently, slight color in the am, clear the rest of the day. Denies any shortness of breath, chest pain or fever. She states that o2 is perfect for her 90-91 but has been using 2L [place] intermittently when needed and it brings it up to 95-96. She states she knows if she doesn't nip this now she'll end up in the hospital. She did state she is currently able to clear her secretions but doesn't want to get so sore from coughing that she can't. She states she has had to use her albuterol  inhaler a couple of times today which is outside of normal for her.     1. ONSET: When did the cough begin?      Worsened today 2. SEVERITY: How bad is the cough today?      moderate 3. SPUTUM: Describe the color of your sputum (e.g., none, dry  cough; clear, white, yellow, green)     Slight color in the am, clear later in the day 4. HEMOPTYSIS: Are you coughing up any blood? If Yes, ask: How much? (e.g., flecks, streaks, tablespoons, etc.)     denies 5. DIFFICULTY BREATHING: Are you having difficulty breathing? If Yes, ask: How bad is it? (e.g., mild, moderate, severe)      Denies but has had to use inhaler a couple of times today 6. FEVER: Do you have a fever? If Yes, ask: What is your temperature, how was it measured, and when did it start?     denies 7. CARDIAC HISTORY: Do you have any history of heart disease? (e.g., heart attack, congestive heart failure)      chf 8. LUNG HISTORY: Do you have any history of lung disease?  (e.g., pulmonary embolus, asthma, emphysema)     Copd, ca 9. OTHER SYMPTOMS: Do you have any other symptoms? (e.g., runny nose, wheezing, chest pain)       denies  Protocols used: Cough - Acute Non-Productive-A-AH

## 2024-04-17 NOTE — Telephone Encounter (Signed)
 Please advise

## 2024-05-14 ENCOUNTER — Encounter: Payer: Self-pay | Admitting: Emergency Medicine

## 2024-05-14 ENCOUNTER — Ambulatory Visit: Admitting: Emergency Medicine

## 2024-05-14 VITALS — BP 128/70 | HR 119 | Ht 65.5 in | Wt 144.5 lb

## 2024-05-14 DIAGNOSIS — J4489 Other specified chronic obstructive pulmonary disease: Secondary | ICD-10-CM

## 2024-05-14 DIAGNOSIS — R918 Other nonspecific abnormal finding of lung field: Secondary | ICD-10-CM

## 2024-05-14 DIAGNOSIS — C3490 Malignant neoplasm of unspecified part of unspecified bronchus or lung: Secondary | ICD-10-CM | POA: Diagnosis not present

## 2024-05-14 DIAGNOSIS — J9611 Chronic respiratory failure with hypoxia: Secondary | ICD-10-CM | POA: Diagnosis not present

## 2024-05-14 MED ORDER — ALBUTEROL SULFATE HFA 108 (90 BASE) MCG/ACT IN AERS: INHALATION_SPRAY | RESPIRATORY_TRACT | 3 refills | Status: AC

## 2024-05-14 NOTE — Assessment & Plan Note (Signed)
 We will perform a walking oximetry today to see if you qualify for a portable oxygen  concentrator. Continue to use your oxygen  at 2 L/min when you exert yourself.  Our goal is to keep your oxygen  saturations > 90%.

## 2024-05-14 NOTE — Assessment & Plan Note (Signed)
 She is currently on observation, has wanted to avoid chemotherapy or radiation therapy if at all possible.  I do believe that PEF is an option for her but thus far we have not been able to confirm that it can be covered by [employer].  She is still working on this.  She would be open to considering PEF if we can determine coverage.  In the meantime we will plan to repeat her CT chest in May to follow progression   Continue to work with your insurance provider regarding possible coverage for pulsed electric field ablation of your pulmonary nodules.  If we get confirmation that they will cover then we can discuss possibly setting up the procedure. We will plan to repeat your CT scan of the chest in May 2026 to follow your nodules. Follow Dr. Shelah in May after your CT so we can review those results together.  Please call sooner if you have any issues or if we are able to schedule the PEF procedure.

## 2024-05-14 NOTE — Patient Instructions (Addendum)
 Please continue your Breztri  2 puffs twice a day.  Rinse and gargle after using. Keep albuterol  available to use 2 puffs up to every 4 hours if needed for shortness of breath, chest tightness, wheezing.  We will refill this for you today. We will perform a walking oximetry today to see if you qualify for a portable oxygen  concentrator. Continue to use your oxygen  at 2 L/min when you exert yourself.  Our goal is to keep your oxygen  saturations > 90%. Continue to work with your insurance provider regarding possible coverage for pulsed electric field ablation of your pulmonary nodules.  If we get confirmation that they will cover then we can discuss possibly setting up the procedure. We will plan to repeat your CT scan of the chest in May 2026 to follow your nodules. Follow Dr. Shelah in May after your CT so we can review those results together.  Please call sooner if you have any issues or if we are able to schedule the PEF procedure.

## 2024-05-14 NOTE — Assessment & Plan Note (Signed)
 Please continue your Breztri  2 puffs twice a day.  Rinse and gargle after using. Keep albuterol  available to use 2 puffs up to every 4 hours if needed for shortness of breath, chest tightness, wheezing.  We will refill this for you today.

## 2024-05-14 NOTE — Progress Notes (Signed)
 "  Subjective:    Patient ID: Gina Cervantes, female    DOB: Oct 08, 1960, 64 y.o.   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 Cloretta).  She has COPD/asthma.  She had been considered for possible PEF with Dr. Brenna but this was not covered by her insurance and it was deferred.  I last saw her in December.  Her CT chest has shown multiple stable mediastinal nodes, subpectoral nodes and subsolid mixed density pulmonary nodules that have gradually increased in size going back to 2018 including a 2.4 cm left upper lobe nodule with an increased solid component.  She wants to avoid chemotherapy and radiation therapy if possible. So far [employer] has not told her that they will cover this  She has oxygen  - has seen some desaturations and uses 2L/min w exertion and prn. Remains on Breztri , feels significant benefit. Uses albuterol  once a week. She was treated with pred + abx in Dec with good improvement.    Review of Systems As per HPI  Past Medical History:  Diagnosis Date   Anal cancer (HCC) 08/09/2012   Squamous cell   Anxiety    PANIC ATTACKS   Arthritis    Breast cancer (HCC) 2009   ER+PR+HER-2+   CHF (congestive heart failure) (HCC)    COPD (chronic obstructive pulmonary disease) (HCC)    Ductal carcinoma (HCC) 02/2008   invasive    Dyspnea    Heart palpitations    History of MRSA infection    History of radiation therapy 09/02/12-10/10/12   anal 50.4Gy   History of shingles 10/2014   HPV (human papilloma virus) anogenital infection    vulvar/ freezing hx   Hx of radiation therapy 2020   Lung nodule    Malignant neoplasm of upper lobe of left lung (HCC) 06/2016   Personal history of chemotherapy    Personal history of radiation therapy 2010   Pneumonia    NO RECENT PROBLEMS   PONV (postoperative nausea and  vomiting)    Hypotension   Radiation 10/21/08-12/08/08   Right breast 6240 cGy   Shingles 2017   Status post chemotherapy 02/2008   Taxol/Herceptin     Family History  Problem Relation Age of Onset   Lung cancer Father    Stroke Mother    Breast cancer Neg Hx      Social History   Socioeconomic History   Marital status: Married    Spouse name: Not on file   Number of children: Not on file   Years of education: Not on file   Highest education level: Not on file  Occupational History    Employer: [employer], [place], [place]    Comment: Realtor  Tobacco Use   Smoking status: Former    Current packs/day: 0.00    Types: Cigarettes    Start date: 2007    Quit date: 2015    Years since quitting: 11.0   Smokeless tobacco: Never   Tobacco comments:    stopped smoking cigarettes Jan. 2018  Vaping Use   Vaping status: Never Used  Substance and Sexual Activity   Alcohol use: Yes    Alcohol/week: 14.0 standard drinks of alcohol    Types: 14 Standard drinks or equivalent per week    Comment: 0-2 drinks of day   Drug use:  No   Sexual activity: Yes    Partners: Male    Birth control/protection: Other-see comments, Post-menopausal    Comment: vasectomy  Other Topics Concern   Not on file  Social History Narrative   Married   Lost a son age 2 this year and husband's brother, still going through grieving process.   No family history of breast or ovarian cancer.   Employed as Veterinary Surgeon   Social Drivers of Health   Tobacco Use: Medium Risk (05/14/2024)   Patient History    Smoking Tobacco Use: Former    Smokeless Tobacco Use: Never    Passive Exposure: Not on Actuary Strain: Not on file  Food Insecurity: No Food Insecurity (12/25/2023)   Epic    Worried About Programme Researcher, Broadcasting/film/video in the Last Year: Never true    Ran Out of Food in the Last Year: Never true  Transportation Needs: No Transportation Needs (12/25/2023)   Epic    Lack of Transportation  (Medical): No    Lack of Transportation (Non-Medical): No  Physical Activity: Not on file  Stress: Not on file  Social Connections: Not on file  Intimate Partner Violence: Not At Risk (12/25/2023)   Epic    Fear of Current or Ex-Partner: No    Emotionally Abused: No    Physically Abused: No    Sexually Abused: No  Depression (PHQ2-9): Low Risk (12/25/2023)   Depression (PHQ2-9)    PHQ-2 Score: 1  Alcohol Screen: Not on file  Housing: Low Risk (12/25/2023)   Epic    Unable to Pay for Housing in the Last Year: No    Number of Times Moved in the Last Year: 0    Homeless in the Last Year: No  Utilities: Not At Risk (12/25/2023)   Epic    Threatened with loss of utilities: No  Health Literacy: Not on file     No Known Allergies   Outpatient Medications Prior to Visit  Medication Sig Dispense Refill   acetaminophen  (TYLENOL ) 500 MG tablet Take 500-1,000 mg by mouth every 6 (six) hours as needed (pain.).     budesonide -glycopyrrolate -formoterol  (BREZTRI  AEROSPHERE) 160-9-4.8 MCG/ACT AERO inhaler Inhale 2 puffs into the lungs in the morning and at bedtime. 10.7 g 6   loratadine  (CLARITIN ) 10 MG tablet Take 10 mg by mouth daily as needed (seasonal allergies).     Multiple Vitamins-Minerals (MULTIVITAMIN WITH MINERALS) tablet Take 3 tablets by mouth daily before breakfast. Woman 50+     Omega-3 Fatty Acids (FISH OIL TRIPLE STRENGTH PO) Take 1 capsule by mouth in the morning.     albuterol  (VENTOLIN  HFA) 108 (90 Base) MCG/ACT inhaler INHALE 2 PUFFS BY MOUTH EVERY 6 HOURS AS NEEDED FOR WHEEZING OR SHORTNESS OF BREATH 18 g 3   doxycycline  (VIBRA -TABS) 100 MG tablet Take 1 tablet (100 mg total) by mouth 2 (two) times daily. (Patient not taking: Reported on 05/14/2024) 14 tablet 0   furosemide  (LASIX ) 20 MG tablet Take 20 mg by mouth daily as needed for fluid or edema. (Patient not taking: Reported on 05/14/2024)     predniSONE  (DELTASONE ) 10 MG tablet Take 40mg  daily for 3 days, then 30mg  daily for 3  days, then 20mg  daily for 3 days, then 10mg  daily for 3 days, then stop (Patient not taking: Reported on 05/14/2024) 30 tablet 0   No facility-administered medications prior to visit.        Objective:   Physical Exam Vitals:   05/14/24  1106 05/14/24 1144 05/14/24 1145 05/14/24 1146  BP: 128/70     Pulse: 99 (!) 105 (!) 129 (!) 119  SpO2: 96% 92% (!) 83% 94%  Weight: 144 lb 8 oz (65.5 kg)     Height: 5' 5.5 (1.664 m)       Gen: Pleasant, well-nourished, in no distress,  normal affect  ENT: No lesions,  mouth clear,  oropharynx clear, no postnasal drip  Neck: No JVD, no stridor  Lungs: No use of accessory muscles, no crackles or wheezing on normal respiration, no wheeze on forced expiration  Cardiovascular: RRR, heart sounds normal, no murmur or gallops, no peripheral edema  Musculoskeletal: No deformities, no cyanosis or clubbing  Neuro: alert, awake, non focal  Skin: Warm, no lesions or rash       Assessment & Plan:  Non-small cell lung cancer with metastasis (HCC) She is currently on observation, has wanted to avoid chemotherapy or radiation therapy if at all possible.  I do believe that PEF is an option for her but thus far we have not been able to confirm that it can be covered by [employer].  She is still working on this.  She would be open to considering PEF if we can determine coverage.  In the meantime we will plan to repeat her CT chest in May to follow progression   Continue to work with your insurance provider regarding possible coverage for pulsed electric field ablation of your pulmonary nodules.  If we get confirmation that they will cover then we can discuss possibly setting up the procedure. We will plan to repeat your CT scan of the chest in May 2026 to follow your nodules. Follow Dr. Shelah in May after your CT so we can review those results together.  Please call sooner if you have any issues or if we are able to schedule the PEF procedure.  COPD with asthma  (HCC) Please continue your Breztri  2 puffs twice a day.  Rinse and gargle after using. Keep albuterol  available to use 2 puffs up to every 4 hours if needed for shortness of breath, chest tightness, wheezing.  We will refill this for you today.  Chronic respiratory failure with hypoxia (HCC) We will perform a walking oximetry today to see if you qualify for a portable oxygen  concentrator. Continue to use your oxygen  at 2 L/min when you exert yourself.  Our goal is to keep your oxygen  saturations > 90%.   I personally spent a total of 43 minutes in the care of the patient today including preparing to see the patient, getting/reviewing separately obtained history, performing a medically appropriate exam/evaluation, counseling and educating, placing orders, documenting clinical information in the EHR, independently interpreting results, and communicating results.    Lamar Shelah, MD, PhD 05/14/2024, 4:54 PM [facility] [phone] or if no answer before 7:00PM call [phone] For any issues after 7:00PM please call eLink [phone]   "

## 2024-05-19 ENCOUNTER — Telehealth: Payer: Self-pay | Admitting: *Deleted

## 2024-05-19 ENCOUNTER — Other Ambulatory Visit: Payer: Self-pay | Admitting: Obstetrics & Gynecology

## 2024-05-19 DIAGNOSIS — Z1231 Encounter for screening mammogram for malignant neoplasm of breast: Secondary | ICD-10-CM

## 2024-05-19 NOTE — Telephone Encounter (Signed)
 Patient called to reschedule her follow up appt. With Dr. Viktoria on Friday, January 6th. Pt has a work related class that is mandatory, and she thought her appt. Was at 1100 and not 3:15 pm. Pt was given the next available appt. On April 23 rd at 3:15 pm  to agreed to this date and time and would like to be called if an earlier appointment becomes available.

## 2024-05-22 ENCOUNTER — Telehealth: Payer: Self-pay | Admitting: *Deleted

## 2024-05-22 NOTE — Telephone Encounter (Signed)
 Spoke with patient to advise that Dr. Viktoria has an opening at 0800 tomorrow morning. Patient agreed and her appt. Was moved from April 23 rd (originally 1/16 at 3:15 pm but she needed a morning appt.) to 1/16 at 0800. Pt aware to arrive by 0745 for check in. Pt thanked the office for calling.

## 2024-05-23 ENCOUNTER — Ambulatory Visit: Admitting: Gynecologic Oncology

## 2024-05-23 ENCOUNTER — Encounter: Payer: Self-pay | Admitting: Gynecologic Oncology

## 2024-05-23 ENCOUNTER — Inpatient Hospital Stay: Attending: Gynecologic Oncology | Admitting: Gynecologic Oncology

## 2024-05-23 VITALS — BP 119/84 | HR 91 | Temp 98.7°F | Resp 18 | Wt 150.4 lb

## 2024-05-23 DIAGNOSIS — B977 Papillomavirus as the cause of diseases classified elsewhere: Secondary | ICD-10-CM | POA: Insufficient documentation

## 2024-05-23 DIAGNOSIS — D069 Carcinoma in situ of cervix, unspecified: Secondary | ICD-10-CM | POA: Insufficient documentation

## 2024-05-23 DIAGNOSIS — Z923 Personal history of irradiation: Secondary | ICD-10-CM | POA: Diagnosis not present

## 2024-05-23 DIAGNOSIS — Z85048 Personal history of other malignant neoplasm of rectum, rectosigmoid junction, and anus: Secondary | ICD-10-CM | POA: Diagnosis not present

## 2024-05-23 DIAGNOSIS — Z9221 Personal history of antineoplastic chemotherapy: Secondary | ICD-10-CM | POA: Diagnosis not present

## 2024-05-23 DIAGNOSIS — Z9981 Dependence on supplemental oxygen: Secondary | ICD-10-CM | POA: Insufficient documentation

## 2024-05-23 DIAGNOSIS — J9610 Chronic respiratory failure, unspecified whether with hypoxia or hypercapnia: Secondary | ICD-10-CM | POA: Insufficient documentation

## 2024-05-23 DIAGNOSIS — Z853 Personal history of malignant neoplasm of breast: Secondary | ICD-10-CM | POA: Insufficient documentation

## 2024-05-23 DIAGNOSIS — Z01812 Encounter for preprocedural laboratory examination: Secondary | ICD-10-CM | POA: Insufficient documentation

## 2024-05-23 DIAGNOSIS — Z85118 Personal history of other malignant neoplasm of bronchus and lung: Secondary | ICD-10-CM | POA: Insufficient documentation

## 2024-05-23 DIAGNOSIS — J449 Chronic obstructive pulmonary disease, unspecified: Secondary | ICD-10-CM | POA: Insufficient documentation

## 2024-05-23 DIAGNOSIS — I428 Other cardiomyopathies: Secondary | ICD-10-CM | POA: Insufficient documentation

## 2024-05-23 NOTE — Progress Notes (Signed)
 Gynecologic Oncology Return Clinic Visit  05/23/24  Reason for Visit: follow-up  Treatment History: Oncology History Overview Note  Dr. Rebbecca reviewed the pathology slides from 2018 and 2020.  He reports the 2018 case is consistent with adenocarcinoma with a less than 5% squamous component.  The 2020 case is involved with a comparable carcinoma, but direct comparison is difficult due to the limited available tissue on the 2020 case.   Breast cancer, right (HCC)  06/04/2007 Initial Diagnosis   Breast cancer, right (HCC)   Anal cancer (HCC)  08/15/2012 Initial Diagnosis   Anal cancer (HCC)   Lung cancer, left  upper lobe (HCC)  07/05/2016 Initial Diagnosis   Lung cancer, left  upper lobe (HCC)   02/04/2020 - 10/18/2020 Chemotherapy         01/19/2021 - 03/28/2021 Chemotherapy   Patient is on Treatment Plan : LUNG CARBOplatin  / Pemetrexed  / Pembrolizumab  q21d Induction x 4 cycles / Maintenance Pemetrexed  + Pembrolizumab      06/20/2021 - 06/21/2021 Chemotherapy   Patient is on Treatment Plan : NON-SMALL CELL LUNG RESEARCH SWOG S1900E Sotorasib  (AMGEN 510) q21d     Non-small cell lung cancer with metastasis (HCC)  12/16/2020 Initial Diagnosis   Non-small cell lung cancer with metastasis (HCC)   01/19/2021 - 03/28/2021 Chemotherapy   Patient is on Treatment Plan : LUNG CARBOplatin  / Pemetrexed  / Pembrolizumab  q21d Induction x 4 cycles / Maintenance Pemetrexed  + Pembrolizumab      06/20/2021 - 06/21/2021 Chemotherapy   Patient is on Treatment Plan : NON-SMALL CELL LUNG RESEARCH SWOG S1900E Sotorasib  (AMGEN 510) q21d      Below is the patient's cytology and dysplasia history: 11/2013 - NILM pap 12/2014 - NILM pap 04/2016 - NILM pap 07/2017 - NILM pap, HR HPV+ (negative 16/18/45) 11/2018 - NILM pap, HR HPV+ (negative 16/18/45) 12/2018 - ECC with fragments of benign transitional zone mucosa, no malignancy 12/2019 - NILM pap, HR HPV+ (negative 16/18/45) 12/2019 - ECC with low-grade dysplasia,  benign endocervical epithelium 12/2020 - NILM pap, HR HPV+ (negative 16/18/45) 12/2021 - NILM pap, HR HPV+ (negative 16/18/45) 05/2022 -ECC with focal scant fragments of ectocervix and squamous mucosa with low-grade dysplasia in the background of predominantly mucus and unremarkable endocervix 05/2023 - ASCUS pap, HR HPV+ (negative 16/18/45) 05/2023 - ECC with low-grade dysplasia 06/2023: LEEP, ECC Pathology revealed CIN-2-3 with low-grade dysplasia and HPV effect, glandular extension.  Dysplasia present circumferentially in the ectocervical and endocervical margins.  Deep stromal margin free.  ECC with detached fragments of high-grade dysplasia. 11/12/2023: Cold knife conization, ECC Pathology revealed CIN-2-3 along the anterior cone specimen with focal extension into the underlying endocervical glands involving 3-6:00, 6-9:00, and 9-12:00 quadrant.  No invasive carcinoma.  High-grade dysplasia involves endocervical margin from 3-6 o'clock and the ectocervical margin from 9-12:00.  The posterior cone specimen shows low-grade dysplasia.  ECC with predominantly blood and fibrin material, minute fragments of detached endocervical glandular cells.  Her history is notable for remote anal and breast cancer as well as non-small cell lung cancer.  In terms of her anal cancer, she underwent concurrent radiation and chemotherapy completed in 2014.  For her breast cancer, which was diagnosed in 2009 and triple positive, she had neoadjuvant chemotherapy followed by surgery and radiation.  She was then on antiestrogen therapy with tamoxifen and ultimately Arimidex  which was continued until 2019.    Interval History: Doing well.  Denies any bleeding, discharge, or pelvic pain.  Past Medical/Surgical History: Past Medical History:  Diagnosis Date  Anal cancer (HCC) 08/09/2012   Squamous cell   Anxiety    PANIC ATTACKS   Arthritis    Breast cancer (HCC) 2009   ER+PR+HER-2+   CHF (congestive heart failure) (HCC)     COPD (chronic obstructive pulmonary disease) (HCC)    Ductal carcinoma (HCC) 02/2008   invasive    Dyspnea    Heart palpitations    History of MRSA infection    History of radiation therapy 09/02/12-10/10/12   anal 50.4Gy   History of shingles 10/2014   HPV (human papilloma virus) anogenital infection    vulvar/ freezing hx   Hx of radiation therapy 2020   Lung nodule    Malignant neoplasm of upper lobe of left lung (HCC) 06/2016   Personal history of chemotherapy    Personal history of radiation therapy 2010   Pneumonia    NO RECENT PROBLEMS   PONV (postoperative nausea and vomiting)    Hypotension   Radiation 10/21/08-12/08/08   Right breast 6240 cGy   Shingles 2017   Status post chemotherapy 02/2008   Taxol/Herceptin    Past Surgical History:  Procedure Laterality Date   BREAST BIOPSY Right 02/04/2008   malignant   BREAST LUMPECTOMY Right 2010   malignant   BREAST LUMPECTOMY WITH AXILLARY LYMPH NODE BIOPSY Right 5/10   Dr. Curvin   BRONCHIAL BIOPSY  12/28/2020   Procedure: BRONCHIAL BIOPSIES;  Surgeon: Brenna Adine CROME, DO;  Location: [facility];  Service: Pulmonary;;   BRONCHIAL BRUSHINGS  12/28/2020   Procedure: BRONCHIAL BRUSHINGS;  Surgeon: Brenna Adine CROME, DO;  Location: [facility];  Service: Pulmonary;;   BRONCHIAL NEEDLE ASPIRATION BIOPSY  12/28/2020   Procedure: BRONCHIAL NEEDLE ASPIRATION BIOPSIES;  Surgeon: Brenna Adine CROME, DO;  Location: [facility];  Service: Pulmonary;;   BRONCHIAL WASHINGS  12/28/2020   Procedure: BRONCHIAL WASHINGS;  Surgeon: Brenna Adine CROME, DO;  Location: [facility];  Service: Pulmonary;;   CERVICAL CONIZATION W/BX N/A 11/12/2023   Procedure: Conization;  Surgeon: Cleotilde Ronal RAMAN, MD;  Location: [facility];  Service: Gynecology;  Laterality: N/A;   CESAREAN SECTION     COLONOSCOPY W/ POLYPECTOMY     ENDOBRONCHIAL ULTRASOUND  12/28/2020   Procedure: ENDOBRONCHIAL ULTRASOUND;  Surgeon: Brenna Adine CROME, DO;  Location: [facility];  Service:  Pulmonary;;   EVALUATION UNDER ANESTHESIA WITH ANAL FISTULECTOMY N/A 08/09/2012   Procedure: EXAM UNDER ANESTHESIA AND BIOPSY OF ANAL MASS;  Surgeon: Krystal JINNY Russell, MD;  Location: [facility];  Service: General;  Laterality: N/A;   IR IMAGING GUIDED PORT INSERTION  01/12/2021   IR RADIOLOGIST EVAL & MGMT  03/11/2021   IR REMOVAL TUN ACCESS W/ PORT W/O FL MOD SED  02/24/2021   KNEE ARTHROSCOPY Left    left knee   LUNG LOBECTOMY Left    PORTACATH PLACEMENT  03/2008   dr. curvin    REMOVAL PORTACATH  2011   RIGHT/LEFT HEART CATH AND CORONARY ANGIOGRAPHY N/A 04/06/2023   Procedure: RIGHT/LEFT HEART CATH AND CORONARY ANGIOGRAPHY;  Surgeon: Ladona Heinz, MD;  Location: [facility];  Service: Cardiovascular;  Laterality: N/A;   VIDEO ASSISTED THORACOSCOPY (VATS)/WEDGE RESECTION Left 07/05/2016   Procedure: VIDEO ASSISTED THORACOSCOPY (VATS)/WEDGE RESECTION left upper lobe,  lymph node dissection and placement of OnQ catheter;  Surgeon: Dallas KATHEE Jude, MD;  Location: [facility] OR;  Service: Thoracic;  Laterality: Left;   VIDEO BRONCHOSCOPY N/A 07/05/2016   Procedure: VIDEO BRONCHOSCOPY;  Surgeon: Dallas KATHEE Jude, MD;  Location: [facility] OR;  Service:  Thoracic;  Laterality: N/A;   VIDEO BRONCHOSCOPY N/A 05/03/2018   Procedure: VIDEO BRONCHOSCOPY;  Surgeon: Army Dallas NOVAK, MD;  Location: [facility] OR;  Service: Thoracic;  Laterality: N/A;   VIDEO BRONCHOSCOPY WITH ENDOBRONCHIAL NAVIGATION N/A 05/03/2018   Procedure: VIDEO BRONCHOSCOPY WITH ENDOBRONCHIAL NAVIGATION WITH BIOPSY;  Surgeon: Army Dallas NOVAK, MD;  Location: [facility];  Service: Thoracic;  Laterality: N/A;   VIDEO BRONCHOSCOPY WITH ENDOBRONCHIAL NAVIGATION Bilateral 12/28/2020   Procedure: VIDEO BRONCHOSCOPY WITH ENDOBRONCHIAL NAVIGATION;  Surgeon: Brenna Adine CROME, DO;  Location: [facility];  Service: Pulmonary;  Laterality: Bilateral;  ION   VIDEO BRONCHOSCOPY WITH ENDOBRONCHIAL ULTRASOUND N/A 05/03/2018   Procedure: VIDEO BRONCHOSCOPY WITH ENDOBRONCHIAL  ULTRASOUND;  Surgeon: Army Dallas NOVAK, MD;  Location: [facility];  Service: Thoracic;  Laterality: N/A;   VIDEO BRONCHOSCOPY WITH RADIAL ENDOBRONCHIAL ULTRASOUND  12/28/2020   Procedure: RADIAL ENDOBRONCHIAL ULTRASOUND;  Surgeon: Brenna Adine CROME, DO;  Location: [facility];  Service: Pulmonary;;    Family History  Problem Relation Age of Onset   Lung cancer Father    Stroke Mother    Breast cancer Neg Hx     Social History   Socioeconomic History   Marital status: Married    Spouse name: Not on file   Number of children: Not on file   Years of education: Not on file   Highest education level: Not on file  Occupational History    Employer: [employer], [place], [place]    Comment: Realtor  Tobacco Use   Smoking status: Former    Current packs/day: 0.00    Types: Cigarettes    Start date: 2007    Quit date: 2015    Years since quitting: 11.0   Smokeless tobacco: Never   Tobacco comments:    stopped smoking cigarettes Jan. 2018  Vaping Use   Vaping status: Never Used  Substance and Sexual Activity   Alcohol use: Yes    Alcohol/week: 14.0 standard drinks of alcohol    Types: 14 Standard drinks or equivalent per week    Comment: 0-2 drinks of day   Drug use: No   Sexual activity: Yes    Partners: Male    Birth control/protection: Other-see comments, Post-menopausal    Comment: vasectomy  Other Topics Concern   Not on file  Social History Narrative   Married   Lost a son age 17 this year and husband's brother, still going through grieving process.   No family history of breast or ovarian cancer.   Employed as Veterinary Surgeon   Social Drivers of Health   Tobacco Use: Medium Risk (05/23/2024)   Patient History    Smoking Tobacco Use: Former    Smokeless Tobacco Use: Never    Passive Exposure: Not on Actuary Strain: Not on file  Food Insecurity: No Food Insecurity (12/25/2023)   Epic    Worried About Programme Researcher, Broadcasting/film/video in the Last Year: Never true     Ran Out of Food in the Last Year: Never true  Transportation Needs: No Transportation Needs (12/25/2023)   Epic    Lack of Transportation (Medical): No    Lack of Transportation (Non-Medical): No  Physical Activity: Not on file  Stress: Not on file  Social Connections: Not on file  Depression (PHQ2-9): Low Risk (12/25/2023)   Depression (PHQ2-9)    PHQ-2 Score: 1  Alcohol Screen: Not on file  Housing: Low Risk (12/25/2023)   Epic    Unable to Pay for  Housing in the Last Year: No    Number of Times Moved in the Last Year: 0    Homeless in the Last Year: No  Utilities: Not At Risk (12/25/2023)   Epic    Threatened with loss of utilities: No  Health Literacy: Not on file    Current Medications: Current Medications[1]  Review of Systems: + anxiety Denies appetite changes, fevers, chills, fatigue, unexplained weight changes. Denies hearing loss, neck lumps or masses, mouth sores, ringing in ears or voice changes. Denies cough or wheezing.  Denies shortness of breath. Denies chest pain or palpitations. Denies leg swelling. Denies abdominal distention, pain, blood in stools, constipation, diarrhea, nausea, vomiting, or early satiety. Denies pain with intercourse, dysuria, frequency, hematuria or incontinence. Denies hot flashes, pelvic pain, vaginal bleeding or vaginal discharge.   Denies joint pain, back pain or muscle pain/cramps. Denies itching, rash, or wounds. Denies dizziness, headaches, numbness or seizures. Denies swollen lymph nodes or glands, denies easy bruising or bleeding. Denies depression, confusion, or decreased concentration.  Physical Exam: BP 119/84 (BP Location: Left Arm, Patient Position: Sitting)   Pulse 91   Temp 98.7 F (37.1 C) (Oral)   Resp 18   Wt 150 lb 6.4 oz (68.2 kg)   LMP 04/07/2008   SpO2 94%   BMI 24.65 kg/m  General: Alert, oriented, no acute distress. HEENT: Posterior oropharynx clear, sclera anicteric. Chest: Clear to auscultation  bilaterally.  No wheezes or rhonchi. Cardiovascular: Regular rate and rhythm, no murmurs. Abdomen: soft, nontender.  Normoactive bowel sounds.  No masses or hepatosplenomegaly appreciated.  Extremities: Grossly normal range of motion.  Warm, well perfused.  No edema bilaterally.  GU: Normal appearing external genitalia without erythema, excoriation, or lesions.  Speculum exam reveals atrophic vaginal mucosa and cervix.  HPV collected.  Colposcopy findings noted below.  Bimanual exam, uterus is small and mobile, no firmness or nodularity of the cervix.  Colposcopy and cervical biopsy procedure Preoperative diagnosis: History of CIN-2 Postoperative diagnosis: Same as above Physician: Viktoria MD Estimated blood loss: Minimal Specimens: Cervical biopsies at 2 and 5:00, ECC Procedure: After the procedure was discussed with the patient including risks and benefits, she gave consent.  She was then placed in dorsolithotomy position and a speculum was placed in the vagina.  Once the cervix was well visualized, 5% acetic acid was applied to the cervix.  There was some acetowhite seen from 12-6 o'clock with 2 areas of punctations and mosaicism at 2 and 4-5:00.  No other lesions noted.  The cervix was then cleansed with Betadine  x3.  Tischler forceps were used to take cervical biopsies.  Given anatomy, it was quite challenging to get a biopsy at 2:00.  Endocervical curettage was then performed with a Kevorkian curette and Cytobrush.  All specimens were placed in formalin.  Silver  nitrate was used to stop small amount of bleeding from cervical biopsy.  Overall the patient tolerated the procedure well.  All instruments were removed from the vagina.  Laboratory & Radiologic Studies: None new  Assessment & Plan: Gina Cervantes is a 64 y.o. woman with HPV-related cervical dysplasia in the setting of a history of HPV-related anal cancer.   Patient is overall doing well, asymptomatic.  Procedure today with  findings consistent with at least low-grade dysplasia.  Several biopsies performed.  I will call her with these results.  Will plan on follow-up in 6 months although patient aware that results from today may dictate intervention sooner.  22 minutes of total  time was spent for this patient encounter, including preparation, face-to-face counseling with the patient and coordination of care, and documentation of the encounter.  Comer Dollar, MD  Division of Gynecologic Oncology  Department of Obstetrics and Gynecology  [facility]      [1]  Current Outpatient Medications:    acetaminophen  (TYLENOL ) 500 MG tablet, Take 500-1,000 mg by mouth every 6 (six) hours as needed (pain.)., Disp: , Rfl:    albuterol  (VENTOLIN  HFA) 108 (90 Base) MCG/ACT inhaler, INHALE 2 PUFFS BY MOUTH EVERY 6 HOURS AS NEEDED FOR WHEEZING OR SHORTNESS OF BREATH, Disp: 18 g, Rfl: 3   budesonide -glycopyrrolate -formoterol  (BREZTRI  AEROSPHERE) 160-9-4.8 MCG/ACT AERO inhaler, Inhale 2 puffs into the lungs in the morning and at bedtime., Disp: 10.7 g, Rfl: 6   doxycycline  (VIBRA -TABS) 100 MG tablet, Take 1 tablet (100 mg total) by mouth 2 (two) times daily. (Patient not taking: Reported on 05/14/2024), Disp: 14 tablet, Rfl: 0   furosemide  (LASIX ) 20 MG tablet, Take 20 mg by mouth daily as needed for fluid or edema. (Patient not taking: Reported on 05/14/2024), Disp: , Rfl:    loratadine  (CLARITIN ) 10 MG tablet, Take 10 mg by mouth daily as needed (seasonal allergies)., Disp: , Rfl:    Multiple Vitamins-Minerals (MULTIVITAMIN WITH MINERALS) tablet, Take 3 tablets by mouth daily before breakfast. Woman 50+, Disp: , Rfl:    Omega-3 Fatty Acids (FISH OIL TRIPLE STRENGTH PO), Take 1 capsule by mouth in the morning., Disp: , Rfl:    predniSONE  (DELTASONE ) 10 MG tablet, Take 40mg  daily for 3 days, then 30mg  daily for 3 days, then 20mg  daily for 3 days, then 10mg  daily for 3 days, then stop (Patient not taking:  Reported on 05/14/2024), Disp: 30 tablet, Rfl: 0

## 2024-05-23 NOTE — Patient Instructions (Signed)
 It was good to see you today.  I will call you with your biopsy results next week.  We will tentatively plan on a 75-month follow-up and arrange any further follow-up if needed based on biopsies.

## 2024-05-28 ENCOUNTER — Ambulatory Visit: Payer: Self-pay | Admitting: Gynecologic Oncology

## 2024-05-28 LAB — SURGICAL PATHOLOGY

## 2024-05-29 ENCOUNTER — Telehealth: Payer: Self-pay | Admitting: Gynecologic Oncology

## 2024-05-29 LAB — CERVICOVAGINAL ANCILLARY ONLY
Comment: NEGATIVE
Comment: NEGATIVE
Comment: NEGATIVE
HPV 16: NEGATIVE
HPV 18 / 45: NEGATIVE
High risk HPV: POSITIVE — AB

## 2024-05-29 NOTE — Telephone Encounter (Signed)
 Called the patient to discuss recent cervical biopsies and ECC.  These show CIN-2-3.  Discussed options for continued close surveillance versus excisional procedure.  Given persistent high-grade dysplasia, I would favor superficial reexcision to exclude invasive cancer and hopefully treat her high-grade dysplasia.  After our discussion, patient voiced being amenable to repeat excision.  Offered her February 4 and 5 as possible surgery dates.  She is interested in February 5.  I will ask someone from my office to call tomorrow to confirm this with her.  Comer Dollar MD Gynecologic Oncology

## 2024-05-30 ENCOUNTER — Other Ambulatory Visit: Payer: Self-pay | Admitting: Gynecologic Oncology

## 2024-05-30 ENCOUNTER — Telehealth: Payer: Self-pay | Admitting: Surgery

## 2024-05-30 ENCOUNTER — Encounter: Payer: Self-pay | Admitting: Gynecologic Oncology

## 2024-05-30 ENCOUNTER — Ambulatory Visit: Admission: RE | Admit: 2024-05-30 | Source: Ambulatory Visit

## 2024-05-30 DIAGNOSIS — D069 Carcinoma in situ of cervix, unspecified: Secondary | ICD-10-CM

## 2024-05-30 DIAGNOSIS — Z1231 Encounter for screening mammogram for malignant neoplasm of breast: Secondary | ICD-10-CM

## 2024-05-30 MED ORDER — TRAMADOL HCL 50 MG PO TABS: 50.0000 mg | ORAL_TABLET | Freq: Four times a day (QID) | ORAL | 0 refills | Status: AC | PRN

## 2024-05-30 MED ORDER — SENNOSIDES-DOCUSATE SODIUM 8.6-50 MG PO TABS: 2.0000 | ORAL_TABLET | Freq: Every day | ORAL | 0 refills | Status: AC

## 2024-05-30 NOTE — Telephone Encounter (Signed)
 Called patient to let her know of surgery scheduled for 2/5. Patient advised of hospital calling her for preop and that she will also receive a call from Darice to review pre-op and post-op instructions. Patient agreeable to this information and had no questions at this time.

## 2024-05-30 NOTE — Telephone Encounter (Signed)
-----   Message from Eleanor Epps, NP sent at 05/30/2024 10:06 AM EST ----- Please let the patient know that her surgery has been scheduled for February 5.  The hospital should be contacting her to arrange for a preop.  I will plan to send the preop and post instructions in my chart for her review.  Someone can set her up for a phone visit with Darice for preop.  She will also need a postop appointment made as well.

## 2024-06-03 ENCOUNTER — Telehealth: Payer: Self-pay | Admitting: *Deleted

## 2024-06-03 NOTE — Patient Instructions (Signed)
 SURGICAL WAITING ROOM VISITATION  Patients having surgery or a procedure may have no more than 2 support people in the waiting area - these visitors may rotate.    Children ages 2 and under will not be able to visit patients in [facility] under most circumstances.   Visitors with respiratory illnesses are discouraged from visiting and should remain at home.  If the patient needs to stay at the hospital during part of their recovery, the visitor guidelines for inpatient rooms apply. Pre-op nurse will coordinate an appropriate time for 1 support person to accompany patient in pre-op.  This support person may not rotate.    Please refer to the [facility] website for the visitor guidelines for Inpatients (after your surgery is over and you are in a regular room).    Your procedure is scheduled on: 06/12/24   Report to [facility] Main Entrance    Report to admitting at 5:15 AM   Call this number if you have problems the morning of surgery [phone]   Do not eat food :After Midnight.   After Midnight you may have the following liquids until 4:30 AM DAY OF SURGERY  Water Non-Citrus Juices (without pulp, NO RED-Apple, White grape, White cranberry) Black Coffee (NO MILK/CREAM OR CREAMERS, sugar ok)  Clear Tea (NO MILK/CREAM OR CREAMERS, sugar ok) regular and decaf                             Plain Jell-O (NO RED)                                           Fruit ices (not with fruit pulp, NO RED)                                     Popsicles (NO RED)                                                               Sports drinks like Gatorade (NO RED)          If you have questions, please contact your surgeons office.   FOLLOW BOWEL PREP AND ANY ADDITIONAL PRE OP INSTRUCTIONS YOU RECEIVED FROM YOUR SURGEON'S OFFICE!!!     Oral Hygiene is also important to reduce your risk of infection.                                    Remember - BRUSH YOUR TEETH THE MORNING OF  SURGERY WITH YOUR REGULAR TOOTHPASTE  DENTURES WILL BE REMOVED PRIOR TO SURGERY PLEASE DO NOT APPLY Poly grip OR ADHESIVES!!!   Do NOT smoke after Midnight   Stop all vitamins and herbal supplements 7 days before surgery.   Take these medicines the morning of surgery with A SIP OF WATER:  Tylenol  Inhalers Loratadine  (Claritin )   DO NOT TAKE ANY ORAL DIABETIC MEDICATIONS DAY OF YOUR SURGERY  Bring CPAP mask and tubing day of surgery.  You may not have any metal on your body including hair pins, jewelry, and body piercing             Do not wear make-up, lotions, powders, perfumes, or deodorant  Do not wear nail polish including gel and S&S, artificial/acrylic nails, or any other type of covering on natural nails including finger and toenails. If you have artificial nails, gel coating, etc. that needs to be removed by a nail salon please have this removed prior to surgery or surgery may need to be canceled/ delayed if the surgeon/ anesthesia feels like they are unable to be safely monitored.   Do not shave  48 hours prior to surgery.    Do not bring valuables to the hospital. [facility] IS NOT             RESPONSIBLE   FOR VALUABLES.   Contacts, glasses, dentures or bridgework may not be worn into surgery.  DO NOT BRING YOUR HOME MEDICATIONS TO THE HOSPITAL. PHARMACY WILL DISPENSE MEDICATIONS LISTED ON YOUR MEDICATION LIST TO YOU DURING YOUR ADMISSION IN THE HOSPITAL!    Patients discharged on the day of surgery will not be allowed to drive home.  Someone NEEDS to stay with you for the first 24 hours after anesthesia.   Special Instructions: Bring a copy of your healthcare power of attorney and living will documents the day of surgery if you haven't scanned them before.              Please read over the following fact sheets you were given: IF YOU HAVE QUESTIONS ABOUT YOUR PRE-OP INSTRUCTIONS PLEASE CALL [phone] [place].   If you received a  COVID test during your pre-op visit  it is requested that you wear a mask when out in public, stay away from anyone that may not be feeling well and notify your surgeon if you develop symptoms. If you test positive for Covid or have been in contact with anyone that has tested positive in the last 10 days please notify you surgeon.    [place] [facility] - Preparing for Surgery Before surgery, you can play an important role.  Because skin is not sterile, your skin needs to be as free of germs as possible.  You can reduce the number of germs on your skin by washing with CHG (chlorahexidine gluconate) soap before surgery.  CHG is an antiseptic cleaner which kills germs and bonds with the skin to continue killing germs even after washing. Please DO NOT use if you have an allergy to CHG or antibacterial soaps.  If your skin becomes reddened/irritated stop using the CHG and inform your nurse when you arrive at Short Stay. Do not shave (including legs and underarms) for at least 48 hours prior to the first CHG shower.  You may shave your face/neck.  Please follow these instructions carefully:  1.  Shower with CHG Soap the night before surgery ONLY (DO NOT USE THE SOAP THE MORNING OF SURGERY).  2.  If you choose to wash your hair, wash your hair first as usual with your normal  shampoo.  3.  After you shampoo, rinse your hair and body thoroughly to remove the shampoo.                             4.  Use CHG as you would any other liquid soap.  You can apply chg directly to the skin and wash.  Gently with a scrungie or clean washcloth.  5.  Apply the CHG Soap to your body ONLY FROM THE NECK DOWN.   Do   not use on face/ open                           Wound or open sores. Avoid contact with eyes, ears mouth and   genitals (private parts).                       Wash face,  Genitals (private parts) with your normal soap.             6.  Wash thoroughly, paying special attention to the area where your    surgery  will be  performed.  7.  Thoroughly rinse your body with warm water from the neck down.  8.  DO NOT shower/wash with your normal soap after using and rinsing off the CHG Soap.                9.  Pat yourself dry with a clean towel.            10.  Wear clean pajamas.            11.  Place clean sheets on your bed the night of your first shower and do not  sleep with pets. Day of Surgery : Do not apply any CHG, lotions/deodorants the morning of surgery.  Please wear clean clothes to the hospital/surgery center.  FAILURE TO FOLLOW THESE INSTRUCTIONS MAY RESULT IN THE CANCELLATION OF YOUR SURGERY  PATIENT SIGNATURE_________________________________  NURSE SIGNATURE__________________________________  ________________________________________________________________________

## 2024-06-03 NOTE — Progress Notes (Signed)
 Date of COVID positive in last 90 days:  PCP - Elsie Lesches, MD Cardiologist - Gordy Bergamo, MD Pulmonologist- Lamar Chris, MD LOV 05/14/24  Chest CT- 09/19/23 Epic Chest x-ray - N/A EKG - 07/23/23 Epic Stress Test - N/A ECHO - 07/23/23 Epic Cardiac Cath - 04/06/23 Epic Pacemaker/ICD device last checked:N/A Spinal Cord Stimulator:N/A  Bowel Prep - N/A  Sleep Study - N/A CPAP -   Fasting Blood Sugar - N/A Checks Blood Sugar _____ times a day  Last dose of GLP1 agonist-  N/A GLP1 instructions:  Do not take after     Last dose of SGLT-2 inhibitors-  N/A SGLT-2 instructions:  Do not take after     Blood Thinner Instructions: N/A Last dose:   Time: Aspirin  Instructions:N/A Last Dose:  Activity level: Can go up a flight of stairs and perform activities of daily living without stopping and without symptoms of chest pain or shortness of breath.  Anesthesia review: CHF, COPD, palpitations, heart palpitations, cardiomyopathy, metastatic lung caner, cont O2 at 2L  Patient denies shortness of breath, fever, cough and chest pain at PAT appointment  Patient verbalized understanding of instructions that were given to them at the PAT appointment. Patient was also instructed that they will need to review over the PAT instructions again at home before surgery.

## 2024-06-03 NOTE — Telephone Encounter (Signed)
 Spoke with patient who called the office to move her surgery date from 06/12/24 with Dr. Viktoria.  Patient was given new surgery date options and she chose 06/25/24. Patient thanked the office for calling and reminded her of her pre-op call with navigator at 1000 tomorrow and her pre-admission appt. At [facility] at 1 pm. Pt verbalized understanding.

## 2024-06-04 ENCOUNTER — Encounter (HOSPITAL_COMMUNITY)
Admission: RE | Admit: 2024-06-04 | Discharge: 2024-06-04 | Disposition: A | Discharge status: Home / Self Care | Source: Ambulatory Visit | Attending: Gynecologic Oncology | Admitting: Gynecologic Oncology

## 2024-06-04 ENCOUNTER — Other Ambulatory Visit: Payer: Self-pay

## 2024-06-04 ENCOUNTER — Encounter (HOSPITAL_COMMUNITY): Payer: Self-pay

## 2024-06-04 ENCOUNTER — Inpatient Hospital Stay

## 2024-06-04 VITALS — BP 122/85 | HR 112 | Resp 16 | Ht 65.5 in | Wt 148.0 lb

## 2024-06-04 DIAGNOSIS — Z85048 Personal history of other malignant neoplasm of rectum, rectosigmoid junction, and anus: Secondary | ICD-10-CM | POA: Insufficient documentation

## 2024-06-04 DIAGNOSIS — Z85118 Personal history of other malignant neoplasm of bronchus and lung: Secondary | ICD-10-CM | POA: Insufficient documentation

## 2024-06-04 DIAGNOSIS — J9610 Chronic respiratory failure, unspecified whether with hypoxia or hypercapnia: Secondary | ICD-10-CM | POA: Insufficient documentation

## 2024-06-04 DIAGNOSIS — Z9221 Personal history of antineoplastic chemotherapy: Secondary | ICD-10-CM | POA: Insufficient documentation

## 2024-06-04 DIAGNOSIS — Z853 Personal history of malignant neoplasm of breast: Secondary | ICD-10-CM | POA: Insufficient documentation

## 2024-06-04 DIAGNOSIS — D069 Carcinoma in situ of cervix, unspecified: Secondary | ICD-10-CM | POA: Insufficient documentation

## 2024-06-04 DIAGNOSIS — Z923 Personal history of irradiation: Secondary | ICD-10-CM | POA: Insufficient documentation

## 2024-06-04 DIAGNOSIS — J449 Chronic obstructive pulmonary disease, unspecified: Secondary | ICD-10-CM | POA: Insufficient documentation

## 2024-06-04 DIAGNOSIS — I5043 Acute on chronic combined systolic (congestive) and diastolic (congestive) heart failure: Secondary | ICD-10-CM

## 2024-06-04 DIAGNOSIS — I428 Other cardiomyopathies: Secondary | ICD-10-CM | POA: Insufficient documentation

## 2024-06-04 DIAGNOSIS — Z9981 Dependence on supplemental oxygen: Secondary | ICD-10-CM | POA: Insufficient documentation

## 2024-06-04 DIAGNOSIS — Z01812 Encounter for preprocedural laboratory examination: Secondary | ICD-10-CM | POA: Insufficient documentation

## 2024-06-04 HISTORY — DX: Anemia, unspecified: D64.9

## 2024-06-04 LAB — CBC
HCT: 41.3 % (ref 36.0–46.0)
Hemoglobin: 14 g/dL (ref 12.0–15.0)
MCH: 36.8 pg — ABNORMAL HIGH (ref 26.0–34.0)
MCHC: 33.9 g/dL (ref 30.0–36.0)
MCV: 108.7 fL — ABNORMAL HIGH (ref 80.0–100.0)
Platelets: 275 10*3/uL (ref 150–400)
RBC: 3.8 MIL/uL — ABNORMAL LOW (ref 3.87–5.11)
RDW: 14.6 % (ref 11.5–15.5)
WBC: 6.9 10*3/uL (ref 4.0–10.5)
nRBC: 0 % (ref 0.0–0.2)

## 2024-06-04 LAB — BASIC METABOLIC PANEL WITH GFR
Anion gap: 9 (ref 5–15)
BUN: 23 mg/dL (ref 8–23)
CO2: 31 mmol/L (ref 22–32)
Calcium: 9.6 mg/dL (ref 8.9–10.3)
Chloride: 96 mmol/L — ABNORMAL LOW (ref 98–111)
Creatinine, Ser: 0.83 mg/dL (ref 0.44–1.00)
GFR, Estimated: >60 mL/min
Glucose, Bld: 125 mg/dL — ABNORMAL HIGH (ref 70–99)
Potassium: 4.5 mmol/L (ref 3.5–5.1)
Sodium: 137 mmol/L (ref 135–145)

## 2024-06-04 NOTE — Progress Notes (Signed)
 Gina Cervantes for a pre-operative appointment prior to her scheduled surgery on 06/25/2024. She is scheduled for a pelvic examination under anesthesia, cold knife conization of the cervix, endocervical curettage. The surgery was discussed in detail.    Discussed post-op pain management in detail including the aspects of the enhanced recovery pathway.  Advised her that a new prescription was sent in for Tramadol  and it is only to be used for after her upcoming surgery.  We discussed the use of tylenol  post-op and to monitor for a maximum of 4,000 mg in a 24 hour period.  Sennakot was also prescribed to be used after surgery and to hold if having loose stools.  Discussed bowel regimen in detail.     Discussed the use of SCDs and measures to take at home to prevent DVT including frequent mobility.  Reportable signs and symptoms of DVT discussed. Post-operative instructions discussed and expectations for after surgery. Incisional care discussed as well including reportable signs and symptoms including erythema, drainage, wound separation.     15 minutes spent with the patient.  Verbalizing understanding of material discussed. No needs or concerns voiced at the end of the visit.   Advised patient to call for any needs.  Advised that her post-operative medications had been prescribed and could be picked up at any time.    This appointment is included in the global surgical bundle as pre-operative teaching and has no charge.

## 2024-06-05 NOTE — Anesthesia Preprocedure Evaluation (Signed)
"                                    Anesthesia Evaluation    Airway        Dental   Pulmonary former smoker          Cardiovascular      Neuro/Psych    GI/Hepatic   Endo/Other    Renal/GU      Musculoskeletal   Abdominal   Peds  Hematology   Anesthesia Other Findings   Reproductive/Obstetrics                              Anesthesia Physical Anesthesia Plan  ASA:   Anesthesia Plan:    Post-op Pain Management:    Induction:   PONV Risk Score and Plan:   Airway Management Planned:   Additional Equipment:   Intra-op Plan:   Post-operative Plan:   Informed Consent:   Plan Discussed with:   Anesthesia Plan Comments: (See PAT note from 1/28)         Anesthesia Quick Evaluation  "

## 2024-06-05 NOTE — Progress Notes (Addendum)
 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 Shelah for pulmonary nodules.  Last seen 05/14/24. Her CT chest has shown multiple stable mediastinal nodes, subpectoral nodes and subsolid mixed density pulmonary nodules that have gradually increased in size going back to 2018 including a 2.4 cm left upper lobe nodule with an increased solid component. Patient is declining additional chemo or radiation. Being considered for PEF but working on insurance coverage. CT chest will be repeated in May 2026.  Follows with cardiology for history of nonischemic cardiomyopathy and HFmrEF.  Most recent echo 07/23/2023 showed normalization of LVEF 60 to 65%, grade 1 DD, normal RV systolic function, no significant valvular abnormalities.  Prior cath 04/06/2023 showed mild CAD, mild pulmonary hypertension.  Patient with elevated HR at PAT visit. Looking at prior values it appears it's chronically elevated.  VS: BP 122/85   Pulse (!) 112   Resp 16   Ht 5' 5.5 (1.664 m)   Wt 67.1 kg   LMP 04/07/2008   SpO2 94%   BMI 24.25 kg/m    BP Readings from Last 3 Encounters:  06/04/24 122/85  05/23/24 119/84  05/14/24 128/70   Pulse Readings from Last 3 Encounters:  06/04/24 (!) 112  05/23/24 91  05/14/24 (!) 119     PROVIDERS: Arloa Elsie [name], MD   LABS: Labs reviewed: Acceptable for surgery. (all labs ordered are  listed, but only abnormal results are displayed)  Labs Reviewed  CBC - Abnormal; Notable for the following components:      Result Value   RBC 3.80 (*)    MCV 108.7 (*)    MCH 36.8 (*)    All other components within normal limits  BASIC METABOLIC PANEL WITH GFR - Abnormal; Notable for the following components:   Chloride 96 (*)    Glucose, Bld 125 (*)    All other components within normal limits     CT Chest 03/19/14:  IMPRESSION: Multifocal solid, subsolid and ground-glass lesions throughout both lungs gradually increasing to exam dated back to 2018 and grossly stable to recent CT from Sep 19, 2023, consistent with metastatic multifocal (primary lung, breast or anal SCC)/versus synchronous primary bronchogenic malignancy (adenocarcinoma spectrum).   Postradiation changes and platelike atelectasis/scarring in right upper lobe, stable.   Postlumpectomy changes of right breast. Multiple axillary and subpectoral bilateral lymph nodes, stable to prior.   Postsurgical changes of wedge resection in left upper lobe.     Echo 07/23/23:  IMPRESSIONS    1. Left ventricular ejection fraction, by estimation, is 60 to 65%. Left ventricular ejection fraction by 3D volume is 59 %. The left ventricle has normal function. The left ventricle has no regional wall motion abnormalities. Left ventricular diastolic  parameters are consistent with Grade I diastolic dysfunction (impaired relaxation).  2. Right ventricular systolic function is normal. The right ventricular size is mildly enlarged.  3. Right atrial size was mildly dilated.  4. The mitral valve is normal in structure. No evidence of mitral valve regurgitation. No evidence of mitral stenosis.  5. The aortic valve is normal in structure. Aortic valve regurgitation is not visualized. No aortic stenosis is present.  6. The inferior vena cava is normal in size with greater than 50% respiratory variability, suggesting right  atrial pressure of 3 mmHg.  R/L heart cath 04/06/2023:  Impression and recommendations: Mild coronary calcification involving the proximal LAD.  Otherwise normal coronary arteries. Mild pulmonary hypertension with low LVEDP and pulmonary capillary wedge suggests primary pulmonary hypertension (WHO group 3).  PAPi is preserved suggesting relatively preserved RV systolic function reserve.  Past Medical History:  Diagnosis Date   Anal cancer (HCC) 08/09/2012   Squamous cell   Anemia    during chemo, blood transfusion x1   Anxiety    PANIC ATTACKS   Arthritis    Breast cancer (HCC) 2009   ER+PR+HER-2+   CHF (congestive heart failure) (HCC)    COPD (chronic obstructive pulmonary disease) (HCC)    Ductal carcinoma (HCC) 02/2008   invasive    Dyspnea    Heart palpitations    History of MRSA infection    History of radiation therapy 09/02/12-10/10/12   anal 50.4Gy   History of shingles 10/2014   HPV (human papilloma virus) anogenital infection    vulvar/ freezing hx   Hx of radiation therapy 2020   Lung nodule    Malignant neoplasm of upper lobe of left lung (HCC) 06/2016   Personal history of chemotherapy    Personal history of radiation therapy 2010   Pneumonia    NO RECENT PROBLEMS   PONV (postoperative nausea and vomiting)    Hypotension   Radiation 10/21/08-12/08/08   Right breast 6240 cGy   Shingles 2017   Status post chemotherapy 02/2008   Taxol/Herceptin    Past Surgical History:  Procedure Laterality Date   BREAST BIOPSY Right 02/04/2008   malignant   BREAST LUMPECTOMY Right 2010   malignant   BREAST LUMPECTOMY WITH AXILLARY LYMPH NODE BIOPSY Right 5/10   Dr. Curvin   BRONCHIAL BIOPSY  12/28/2020   Procedure: BRONCHIAL BIOPSIES;  Surgeon: Brenna Adine CROME, DO;  Location: [facility];  Service: Pulmonary;;   BRONCHIAL BRUSHINGS  12/28/2020   Procedure: BRONCHIAL BRUSHINGS;  Surgeon: Brenna Adine CROME, DO;  Location: [facility];  Service: Pulmonary;;   BRONCHIAL  NEEDLE ASPIRATION BIOPSY  12/28/2020   Procedure: BRONCHIAL NEEDLE ASPIRATION BIOPSIES;  Surgeon: Brenna Adine CROME, DO;  Location: [facility];  Service: Pulmonary;;   BRONCHIAL WASHINGS  12/28/2020   Procedure: BRONCHIAL WASHINGS;  Surgeon: Brenna Adine CROME, DO;  [facility]: [facility];  Service: Pulmonary;;   CERVICAL CONIZATION W/BX N/A 11/12/2023   Procedure: Conization;  Surgeon: Cleotilde Ronal RAMAN, MD;  Location: [facility] OR;  Service: Gynecology;  Laterality: N/A;   CESAREAN SECTION     COLONOSCOPY W/ POLYPECTOMY     ENDOBRONCHIAL ULTRASOUND  12/28/2020   Procedure: ENDOBRONCHIAL ULTRASOUND;  Surgeon: Brenna Adine CROME, DO;  [facility]: [facility];  Service: Pulmonary;;   EVALUATION UNDER ANESTHESIA WITH ANAL FISTULECTOMY N/A 08/09/2012   Procedure: EXAM UNDER ANESTHESIA AND BIOPSY OF ANAL MASS;  Surgeon: Krystal JINNY Russell, MD;  Location[facility];  Service: General;  Laterality: N/A;   IR IMAGING GUIDED PORT INSERTION  01/12/2021   IR RADIOLOGIST EVAL & MGMT  03/11/2021   IR REMOVAL TUN ACCESS W/ PORT W/O FL MOD SED  02/24/2021   KNEE ARTHROSCOPY Left    left knee   LUNG LOBECTOMY Left    PORTACATH PLACEMENT  03/2008   dr. curvin    REMOVAL PORTACATH  2011   RIGHT/LEFT HEART CATH AND CORONARY ANGIOGRAPHY N/A 04/06/2023   Procedure: RIGHT/LEFT HEART CATH AND CORONARY ANGIOGRAPHY;  Surgeon: Ladona Heinz, MD;  [facility];  Service: Cardiovascular;  Laterality: N/A;   VIDEO ASSISTED THORACOSCOPY (VATS)/WEDGE RESECTION Left 07/05/2016   Procedure: VIDEO ASSISTED THORACOSCOPY (VATS)/WEDGE RESECTION left upper lobe,  lymph node dissection and placement of OnQ catheter;  Surgeon: Dallas KATHEE Jude, MD;  Location: [facility] OR;  Service: Thoracic;  Laterality: Left;   VIDEO BRONCHOSCOPY N/A 07/05/2016   Procedure: VIDEO BRONCHOSCOPY;  Surgeon: Dallas KATHEE Jude, MD;  Location: [facility] OR;  Service: Thoracic;  Laterality: N/A;   VIDEO BRONCHOSCOPY N/A 05/03/2018   Procedure: VIDEO BRONCHOSCOPY;  Surgeon:  Jude Dallas KATHEE, MD;  Location: [facility] OR;  Service: Thoracic;  Laterality: N/A;   VIDEO BRONCHOSCOPY WITH ENDOBRONCHIAL NAVIGATION N/A 05/03/2018   Procedure: VIDEO BRONCHOSCOPY WITH ENDOBRONCHIAL NAVIGATION WITH BIOPSY;  Surgeon: Jude Dallas KATHEE, MD;  [facility];  Service: Thoracic;  Laterality: N/A;   VIDEO BRONCHOSCOPY WITH ENDOBRONCHIAL NAVIGATION Bilateral 12/28/2020   Procedure: VIDEO BRONCHOSCOPY WITH ENDOBRONCHIAL NAVIGATION;  Surgeon: Brenna Adine CROME, DO;  [facility];  Service: Pulmonary;  Laterality: Bilateral;  ION   VIDEO BRONCHOSCOPY WITH ENDOBRONCHIAL ULTRASOUND N/A 05/03/2018   Procedure: VIDEO BRONCHOSCOPY WITH ENDOBRONCHIAL ULTRASOUND;  Surgeon: Jude Dallas KATHEE, MD;  Location[facility];  Service: Thoracic;  Laterality: N/A;   VIDEO BRONCHOSCOPY WITH RADIAL ENDOBRONCHIAL ULTRASOUND  12/28/2020   Procedure: RADIAL ENDOBRONCHIAL ULTRASOUND;  Surgeon: Brenna Adine CROME, DO;  Location: [facility];  Service: Pulmonary;;    MEDICATIONS:  acetaminophen  (TYLENOL ) 500 MG tablet   albuterol  (VENTOLIN  HFA) 108 (90 Base) MCG/ACT inhaler   budesonide -glycopyrrolate -formoterol  (BREZTRI  AEROSPHERE) 160-9-4.8 MCG/ACT AERO inhaler   doxycycline  (VIBRA -TABS) 100 MG tablet   loratadine  (CLARITIN ) 10 MG tablet   Multiple Vitamins-Minerals (MULTIVITAMIN WITH MINERALS) tablet   Omega-3 Fatty Acids (FISH OIL TRIPLE STRENGTH PO)   predniSONE  (DELTASONE ) 10 MG tablet   senna-docusate (SENOKOT-S) 8.6-50 MG tablet   traMADol  (ULTRAM ) 50 MG tablet   No current facility-administered medications for this encounter.   Burnard CHRISTELLA Odis DEVONNA [facility] Short Stay/Anesthesiology [facility] Phone [phone] 06/05/2024 7:18 PM        "

## 2024-06-12 ENCOUNTER — Telehealth: Payer: Self-pay

## 2024-06-12 DIAGNOSIS — D069 Carcinoma in situ of cervix, unspecified: Secondary | ICD-10-CM

## 2024-06-12 NOTE — Telephone Encounter (Signed)
 Copied from CRM #[id number]. Topic: Clinical - Order For Equipment >> Jun 12, 2024  4:31 PM Leila BROCKS wrote: Reason for CRM: Patient [phone] states O2 walking oximetry test was done by Dr. Lanny nurse on 05/14/24, and a stat order was sent for a portable oxygen  concentrator. Patient has not heard from the office. Unable to reach [facility], tried a few times. Please advise and call back.    Called and spoke to the pt regarding referral 05/14/24. I was unaware of [employer]'s note.  Pt is established with [employer]. [facility] can we send that order to [employer] ASAP please?

## 2024-06-13 NOTE — Telephone Encounter (Signed)
 Order has been refaxed to [employer] [facility]

## 2024-06-25 ENCOUNTER — Ambulatory Visit (HOSPITAL_COMMUNITY): Admission: RE | Admit: 2024-06-25 | Admitting: Gynecologic Oncology

## 2024-06-25 ENCOUNTER — Encounter (HOSPITAL_COMMUNITY): Payer: Self-pay | Admitting: Medical

## 2024-06-25 ENCOUNTER — Encounter: Admission: RE | Payer: Self-pay

## 2024-06-25 DIAGNOSIS — D069 Carcinoma in situ of cervix, unspecified: Secondary | ICD-10-CM

## 2024-07-31 ENCOUNTER — Inpatient Hospital Stay: Admitting: Gynecologic Oncology

## 2024-08-28 ENCOUNTER — Inpatient Hospital Stay: Admitting: Gynecologic Oncology

## 2024-09-16 ENCOUNTER — Other Ambulatory Visit

## 2024-09-24 ENCOUNTER — Ambulatory Visit: Admitting: Emergency Medicine

## 2024-11-21 ENCOUNTER — Inpatient Hospital Stay: Admitting: Gynecologic Oncology
